# Patient Record
Sex: Male | Born: 1935
Health system: Southern US, Community
[De-identification: ages and names within clinical notes are randomized; demographics above are authoritative.]

## PROBLEM LIST (undated history)

## (undated) DIAGNOSIS — D649 Anemia, unspecified: Secondary | ICD-10-CM

## (undated) DIAGNOSIS — D638 Anemia in other chronic diseases classified elsewhere: Secondary | ICD-10-CM

## (undated) DIAGNOSIS — J309 Allergic rhinitis, unspecified: Secondary | ICD-10-CM

## (undated) DIAGNOSIS — E669 Obesity, unspecified: Secondary | ICD-10-CM

## (undated) DIAGNOSIS — I10 Essential (primary) hypertension: Secondary | ICD-10-CM

## (undated) DIAGNOSIS — S0591XA Unspecified injury of right eye and orbit, initial encounter: Secondary | ICD-10-CM

## (undated) DIAGNOSIS — N529 Male erectile dysfunction, unspecified: Secondary | ICD-10-CM

## (undated) DIAGNOSIS — IMO0002 Reserved for concepts with insufficient information to code with codable children: Secondary | ICD-10-CM

## (undated) DIAGNOSIS — E785 Hyperlipidemia, unspecified: Secondary | ICD-10-CM

## (undated) DIAGNOSIS — Z87438 Personal history of other diseases of male genital organs: Secondary | ICD-10-CM

## (undated) DIAGNOSIS — E119 Type 2 diabetes mellitus without complications: Secondary | ICD-10-CM

## (undated) DIAGNOSIS — N200 Calculus of kidney: Secondary | ICD-10-CM

## (undated) DIAGNOSIS — I639 Cerebral infarction, unspecified: Secondary | ICD-10-CM

## (undated) DIAGNOSIS — M5137 Other intervertebral disc degeneration, lumbosacral region: Secondary | ICD-10-CM

## (undated) DIAGNOSIS — K620 Anal polyp: Secondary | ICD-10-CM

## (undated) DIAGNOSIS — R9430 Abnormal result of cardiovascular function study, unspecified: Secondary | ICD-10-CM

## (undated) DIAGNOSIS — N400 Enlarged prostate without lower urinary tract symptoms: Secondary | ICD-10-CM

## (undated) DIAGNOSIS — K621 Rectal polyp: Secondary | ICD-10-CM

## (undated) HISTORY — DX: Anemia in other chronic diseases classified elsewhere: D63.8

## (undated) HISTORY — DX: Anemia, unspecified: D64.9

## (undated) HISTORY — DX: Abnormal result of cardiovascular function study, unspecified: R94.30

## (undated) HISTORY — DX: Calculus of kidney: N20.0

## (undated) HISTORY — PX: PROSTATE BIOPSY: SHX241

## (undated) HISTORY — DX: Other intervertebral disc degeneration, lumbosacral region: M51.37

## (undated) HISTORY — DX: Hyperlipidemia, unspecified: E78.5

## (undated) HISTORY — DX: Essential (primary) hypertension: I10

## (undated) HISTORY — DX: Reserved for concepts with insufficient information to code with codable children: IMO0002

## (undated) HISTORY — DX: Obesity, unspecified: E66.9

## (undated) HISTORY — DX: Rectal polyp: K62.1

## (undated) HISTORY — DX: Type 2 diabetes mellitus without complications: E11.9

## (undated) HISTORY — DX: Anal polyp: K62.0

## (undated) HISTORY — PX: RECTAL POLYPECTOMY: SHX2309

## (undated) HISTORY — DX: Benign prostatic hyperplasia without lower urinary tract symptoms: N40.0

## (undated) HISTORY — DX: Allergic rhinitis, unspecified: J30.9

## (undated) HISTORY — DX: Unspecified injury of right eye and orbit, initial encounter: S05.91XA

## (undated) HISTORY — DX: Personal history of other diseases of male genital organs: Z87.438

## (undated) HISTORY — PX: SKIN BIOPSY: SHX1

## (undated) HISTORY — DX: Cerebral infarction, unspecified: I63.9

## (undated) HISTORY — DX: Male erectile dysfunction, unspecified: N52.9

## (undated) HISTORY — PX: LUMBAR DISC SURGERY: SHX700

---

## 2001-09-19 ENCOUNTER — Encounter: Payer: Self-pay | Admitting: Internal Medicine

## 2001-12-08 ENCOUNTER — Encounter: Admission: RE | Admit: 2001-12-08 | Discharge: 2002-03-08 | Payer: Self-pay | Admitting: Internal Medicine

## 2003-01-01 ENCOUNTER — Encounter: Payer: Self-pay | Admitting: General Surgery

## 2003-01-01 ENCOUNTER — Encounter: Admission: RE | Admit: 2003-01-01 | Discharge: 2003-01-01 | Payer: Self-pay | Admitting: General Surgery

## 2003-01-04 ENCOUNTER — Encounter (INDEPENDENT_AMBULATORY_CARE_PROVIDER_SITE_OTHER): Payer: Self-pay | Admitting: *Deleted

## 2003-01-04 ENCOUNTER — Ambulatory Visit (HOSPITAL_BASED_OUTPATIENT_CLINIC_OR_DEPARTMENT_OTHER): Admission: RE | Admit: 2003-01-04 | Discharge: 2003-01-04 | Payer: Self-pay | Admitting: General Surgery

## 2004-10-03 ENCOUNTER — Ambulatory Visit: Payer: Self-pay | Admitting: Internal Medicine

## 2004-10-16 ENCOUNTER — Ambulatory Visit: Payer: Self-pay | Admitting: Internal Medicine

## 2005-09-06 ENCOUNTER — Ambulatory Visit: Payer: Self-pay | Admitting: Internal Medicine

## 2005-09-07 ENCOUNTER — Ambulatory Visit: Payer: Self-pay | Admitting: Internal Medicine

## 2005-10-16 ENCOUNTER — Ambulatory Visit (HOSPITAL_COMMUNITY): Admission: RE | Admit: 2005-10-16 | Discharge: 2005-10-16 | Payer: Self-pay | Admitting: Internal Medicine

## 2005-10-16 ENCOUNTER — Ambulatory Visit: Payer: Self-pay | Admitting: Internal Medicine

## 2005-10-16 ENCOUNTER — Encounter (INDEPENDENT_AMBULATORY_CARE_PROVIDER_SITE_OTHER): Payer: Self-pay | Admitting: Specialist

## 2005-11-01 ENCOUNTER — Ambulatory Visit: Admission: RE | Admit: 2005-11-01 | Discharge: 2005-11-01 | Payer: Self-pay | Admitting: General Surgery

## 2005-11-01 ENCOUNTER — Encounter (INDEPENDENT_AMBULATORY_CARE_PROVIDER_SITE_OTHER): Payer: Self-pay | Admitting: Specialist

## 2005-11-15 ENCOUNTER — Ambulatory Visit: Payer: Self-pay | Admitting: Internal Medicine

## 2006-09-19 ENCOUNTER — Ambulatory Visit: Payer: Self-pay | Admitting: Internal Medicine

## 2006-11-25 ENCOUNTER — Ambulatory Visit: Payer: Self-pay | Admitting: Internal Medicine

## 2006-12-25 ENCOUNTER — Ambulatory Visit: Payer: Self-pay | Admitting: Internal Medicine

## 2006-12-25 LAB — CONVERTED CEMR LAB
AST: 31 units/L (ref 0–37)
Basophils Relative: 0.4 % (ref 0.0–1.0)
Bilirubin, Direct: 0.1 mg/dL (ref 0.0–0.3)
Chloride: 106 meq/L (ref 96–112)
Creatinine, Ser: 1 mg/dL (ref 0.4–1.5)
Creatinine,U: 31.5 mg/dL
Eosinophils Absolute: 0.2 10*3/uL (ref 0.0–0.6)
GFR calc non Af Amer: 79 mL/min
Glucose, Bld: 134 mg/dL — ABNORMAL HIGH (ref 70–99)
Hemoglobin: 11.4 g/dL — ABNORMAL LOW (ref 13.0–17.0)
Lymphocytes Relative: 42.5 % (ref 12.0–46.0)
MCHC: 33.1 g/dL (ref 30.0–36.0)
MCV: 84.8 fL (ref 78.0–100.0)
Monocytes Absolute: 0.3 10*3/uL (ref 0.2–0.7)
Monocytes Relative: 8.6 % (ref 3.0–11.0)
Neutro Abs: 1.5 10*3/uL (ref 1.4–7.7)
Potassium: 4 meq/L (ref 3.5–5.1)
Sodium: 141 meq/L (ref 135–145)
TSH: 1.48 microintl units/mL (ref 0.35–5.50)
Total Bilirubin: 0.9 mg/dL (ref 0.3–1.2)
Vitamin B-12: 487 pg/mL (ref 211–911)

## 2007-03-31 DIAGNOSIS — E785 Hyperlipidemia, unspecified: Secondary | ICD-10-CM

## 2007-03-31 DIAGNOSIS — E663 Overweight: Secondary | ICD-10-CM | POA: Insufficient documentation

## 2007-03-31 DIAGNOSIS — K621 Rectal polyp: Secondary | ICD-10-CM

## 2007-03-31 DIAGNOSIS — K620 Anal polyp: Secondary | ICD-10-CM | POA: Insufficient documentation

## 2007-03-31 DIAGNOSIS — E119 Type 2 diabetes mellitus without complications: Secondary | ICD-10-CM

## 2007-03-31 DIAGNOSIS — M5137 Other intervertebral disc degeneration, lumbosacral region: Secondary | ICD-10-CM | POA: Insufficient documentation

## 2007-03-31 DIAGNOSIS — I10 Essential (primary) hypertension: Secondary | ICD-10-CM | POA: Insufficient documentation

## 2007-03-31 DIAGNOSIS — D638 Anemia in other chronic diseases classified elsewhere: Secondary | ICD-10-CM

## 2007-03-31 DIAGNOSIS — E782 Mixed hyperlipidemia: Secondary | ICD-10-CM | POA: Insufficient documentation

## 2007-03-31 DIAGNOSIS — N400 Enlarged prostate without lower urinary tract symptoms: Secondary | ICD-10-CM

## 2007-03-31 DIAGNOSIS — M51379 Other intervertebral disc degeneration, lumbosacral region without mention of lumbar back pain or lower extremity pain: Secondary | ICD-10-CM

## 2007-03-31 HISTORY — DX: Essential (primary) hypertension: I10

## 2007-03-31 HISTORY — DX: Other intervertebral disc degeneration, lumbosacral region: M51.37

## 2007-03-31 HISTORY — DX: Type 2 diabetes mellitus without complications: E11.9

## 2007-03-31 HISTORY — DX: Benign prostatic hyperplasia without lower urinary tract symptoms: N40.0

## 2007-03-31 HISTORY — DX: Other intervertebral disc degeneration, lumbosacral region without mention of lumbar back pain or lower extremity pain: M51.379

## 2007-03-31 HISTORY — DX: Anemia in other chronic diseases classified elsewhere: D63.8

## 2007-03-31 HISTORY — DX: Anal polyp: K62.0

## 2007-03-31 HISTORY — DX: Hyperlipidemia, unspecified: E78.5

## 2007-05-17 ENCOUNTER — Encounter: Payer: Self-pay | Admitting: *Deleted

## 2007-06-26 ENCOUNTER — Ambulatory Visit: Payer: Self-pay | Admitting: Internal Medicine

## 2007-06-26 LAB — CONVERTED CEMR LAB
BUN: 12 mg/dL (ref 6–23)
Basophils Relative: 0.1 % (ref 0.0–1.0)
CO2: 33 meq/L — ABNORMAL HIGH (ref 19–32)
Cholesterol: 163 mg/dL (ref 0–200)
Creatinine, Ser: 1.1 mg/dL (ref 0.4–1.5)
Eosinophils Relative: 4.7 % (ref 0.0–5.0)
GFR calc Af Amer: 85 mL/min
Glucose, Bld: 131 mg/dL — ABNORMAL HIGH (ref 70–99)
Lymphocytes Relative: 35.6 % (ref 12.0–46.0)
Monocytes Absolute: 0.3 10*3/uL (ref 0.2–0.7)
Neutro Abs: 1.8 10*3/uL (ref 1.4–7.7)
Neutrophils Relative %: 51.8 % (ref 43.0–77.0)
Potassium: 4.9 meq/L (ref 3.5–5.1)
RDW: 13.9 % (ref 11.5–14.6)
Total CHOL/HDL Ratio: 3.5
Triglycerides: 81 mg/dL (ref 0–149)

## 2007-07-24 ENCOUNTER — Encounter: Payer: Self-pay | Admitting: Internal Medicine

## 2007-10-13 ENCOUNTER — Ambulatory Visit: Payer: Self-pay | Admitting: Internal Medicine

## 2007-10-13 DIAGNOSIS — D649 Anemia, unspecified: Secondary | ICD-10-CM | POA: Insufficient documentation

## 2007-10-13 DIAGNOSIS — J309 Allergic rhinitis, unspecified: Secondary | ICD-10-CM

## 2007-10-13 HISTORY — DX: Allergic rhinitis, unspecified: J30.9

## 2008-03-01 ENCOUNTER — Telehealth: Payer: Self-pay | Admitting: Internal Medicine

## 2008-03-01 DIAGNOSIS — L989 Disorder of the skin and subcutaneous tissue, unspecified: Secondary | ICD-10-CM | POA: Insufficient documentation

## 2008-06-17 LAB — CONVERTED CEMR LAB: PSA: <3 ng/mL

## 2008-11-08 ENCOUNTER — Ambulatory Visit: Payer: Self-pay | Admitting: Internal Medicine

## 2008-11-16 ENCOUNTER — Ambulatory Visit (HOSPITAL_COMMUNITY): Admission: RE | Admit: 2008-11-16 | Discharge: 2008-11-16 | Payer: Self-pay | Admitting: Internal Medicine

## 2008-11-16 ENCOUNTER — Ambulatory Visit: Payer: Self-pay | Admitting: Internal Medicine

## 2008-11-16 LAB — HM COLONOSCOPY

## 2008-11-17 ENCOUNTER — Telehealth: Payer: Self-pay | Admitting: Internal Medicine

## 2008-12-22 ENCOUNTER — Ambulatory Visit: Payer: Self-pay | Admitting: Internal Medicine

## 2008-12-22 DIAGNOSIS — R5381 Other malaise: Secondary | ICD-10-CM | POA: Insufficient documentation

## 2008-12-22 DIAGNOSIS — R5383 Other fatigue: Secondary | ICD-10-CM

## 2008-12-23 ENCOUNTER — Telehealth: Payer: Self-pay | Admitting: Internal Medicine

## 2008-12-23 LAB — CONVERTED CEMR LAB
Albumin: 4.3 g/dL (ref 3.5–5.2)
Basophils Absolute: 0 10*3/uL (ref 0.0–0.1)
Basophils Relative: 1.2 % (ref 0.0–3.0)
CO2: 30 meq/L (ref 19–32)
Calcium: 9.9 mg/dL (ref 8.4–10.5)
Chloride: 109 meq/L (ref 96–112)
Creatinine, Ser: 1 mg/dL (ref 0.4–1.5)
Eosinophils Absolute: 0.1 10*3/uL (ref 0.0–0.7)
Folate: 14.5 ng/mL
Glucose, Bld: 149 mg/dL — ABNORMAL HIGH (ref 70–99)
HDL: 41.7 mg/dL (ref >39.00)
Hemoglobin: 12.2 g/dL — ABNORMAL LOW (ref 13.0–17.0)
LDL Cholesterol: 116 mg/dL — ABNORMAL HIGH (ref 0–99)
MCHC: 33 g/dL (ref 30.0–36.0)
MCV: 84.7 fL (ref 78.0–100.0)
Monocytes Absolute: 0.4 10*3/uL (ref 0.1–1.0)
Neutro Abs: 1.9 10*3/uL (ref 1.4–7.7)
RBC: 4.37 M/uL (ref 4.22–5.81)
RDW: 13.9 % (ref 11.5–14.6)
Sed Rate: 20 mm/hr (ref 0–22)
TSH: 0.99 microintl units/mL (ref 0.35–5.50)
Total CHOL/HDL Ratio: 4
Total Protein: 7.4 g/dL (ref 6.0–8.3)
Triglycerides: 145 mg/dL (ref 0.0–149.0)
VLDL: 29 mg/dL (ref 0.0–40.0)
Vitamin B-12: 397 pg/mL (ref 211–911)

## 2009-01-19 ENCOUNTER — Emergency Department (HOSPITAL_COMMUNITY): Admission: EM | Admit: 2009-01-19 | Discharge: 2009-01-19 | Payer: Self-pay | Admitting: Family Medicine

## 2009-01-20 ENCOUNTER — Telehealth (INDEPENDENT_AMBULATORY_CARE_PROVIDER_SITE_OTHER): Payer: Self-pay | Admitting: *Deleted

## 2009-01-21 ENCOUNTER — Encounter: Payer: Self-pay | Admitting: Internal Medicine

## 2009-08-17 ENCOUNTER — Telehealth (INDEPENDENT_AMBULATORY_CARE_PROVIDER_SITE_OTHER): Payer: Self-pay | Admitting: *Deleted

## 2009-08-18 ENCOUNTER — Telehealth: Payer: Self-pay | Admitting: Internal Medicine

## 2009-12-05 ENCOUNTER — Ambulatory Visit: Payer: Self-pay | Admitting: Internal Medicine

## 2009-12-05 LAB — CONVERTED CEMR LAB
Alkaline Phosphatase: 77 units/L (ref 39–117)
Basophils Relative: 0.3 % (ref 0.0–3.0)
Bilirubin, Direct: 0.2 mg/dL (ref 0.0–0.3)
CO2: 28 meq/L (ref 19–32)
Calcium: 9.4 mg/dL (ref 8.4–10.5)
Creatinine, Ser: 1 mg/dL (ref 0.4–1.5)
Eosinophils Absolute: 0.1 10*3/uL (ref 0.0–0.7)
GFR calc non Af Amer: 94.47 mL/min (ref >60)
HDL: 52.3 mg/dL (ref >39.00)
Hemoglobin, Urine: NEGATIVE
Ketones, ur: NEGATIVE mg/dL
Lymphocytes Relative: 39.7 % (ref 12.0–46.0)
MCHC: 31.8 g/dL (ref 30.0–36.0)
Monocytes Relative: 10.3 % (ref 3.0–12.0)
Neutrophils Relative %: 45.9 % (ref 43.0–77.0)
RBC: 4.44 M/uL (ref 4.22–5.81)
TSH: 1.32 microintl units/mL (ref 0.35–5.50)
Total CHOL/HDL Ratio: 4
Total Protein: 7.2 g/dL (ref 6.0–8.3)
Urine Glucose: >=1000 mg/dL
Urobilinogen, UA: 0.2 (ref 0.0–1.0)
VLDL: 27.8 mg/dL (ref 0.0–40.0)
WBC: 3.6 10*3/uL — ABNORMAL LOW (ref 4.5–10.5)

## 2010-05-29 ENCOUNTER — Ambulatory Visit: Payer: Self-pay | Admitting: Internal Medicine

## 2010-05-29 LAB — CONVERTED CEMR LAB
CO2: 31 meq/L (ref 19–32)
Chloride: 104 meq/L (ref 96–112)
Glucose, Bld: 145 mg/dL — ABNORMAL HIGH (ref 70–99)
HDL: 42.1 mg/dL (ref >39.00)
LDL Cholesterol: 97 mg/dL (ref 0–99)
Potassium: 4.8 meq/L (ref 3.5–5.1)
Sodium: 141 meq/L (ref 135–145)
Total CHOL/HDL Ratio: 4
Triglycerides: 162 mg/dL — ABNORMAL HIGH (ref 0.0–149.0)

## 2010-08-02 ENCOUNTER — Emergency Department (HOSPITAL_COMMUNITY): Admission: EM | Admit: 2010-08-02 | Discharge: 2010-08-02 | Payer: Self-pay | Admitting: Family Medicine

## 2010-10-05 ENCOUNTER — Encounter: Payer: Self-pay | Admitting: Internal Medicine

## 2010-10-17 NOTE — Assessment & Plan Note (Signed)
Summary: 6 MOS F/U / # / CD   Vital Signs:  Patient profile:   75 year old male Height:      72 inches Weight:      229.38 pounds BMI:     31.22 O2 Sat:      96 % on Room air Temp:     98.6 degrees F oral Pulse rate:   86 / minute BP sitting:   162 / 78  (left arm) Cuff size:   large  Vitals Entered By: Zella Ball Ewing CMA (AAMA) (May 29, 2010 9:15 AM)  O2 Flow:  Room air CC: 6 month ROV/RE   CC:  6 month ROV/RE.  History of Present Illness: here to f/u - has lost close to 20 lbs by his scale at home;  wt here down 14 lbs,  goal wt he thinks owuld be 215;  due for flu shot today; real;ly trying to excercise and get wt down;  BP at home 140 sbp, Pt denies CP, worsening sob, doe, wheezing, orthopnea, pnd, worsening LE edema, palps, dizziness or syncope  Pt denies new neuro symptoms such as headache, facial or extremity weakness  .Pt denies polydipsia, polyuria, or low sugar symptoms such as shakiness improved with eating.  Overall good compliance with meds, trying to follow low chol, DM diet, wt stable, little excercise however  No fever, wt loss, night sweats, loss of appetite or other constitutional symptoms  Unfort could not tolerated hte metformin due to GI upset and felt terrible.    Preventive Screening-Counseling & Management      Drug Use:  no.    Problems Prior to Update: 1)  Preventive Health Care  (ICD-V70.0) 2)  Fatigue  (ICD-780.79) 3)  Skin Lesion  (ICD-709.9) 4)  Allergic Rhinitis  (ICD-477.9) 5)  Anemia-nos  (ICD-285.9) 6)  Disc Disease, Lumbar  (ICD-722.52) 7)  Polyp, Anal and Rectal  (ICD-569.0) 8)  Anemia, Chronic Disease Nec  (ICD-285.29) 9)  Obesity Nos  (ICD-278.00) 10)  Benign Prostatic Hypertrophy  (ICD-600.00) 11)  Hypertension  (ICD-401.9) 12)  Hyperlipidemia  (ICD-272.4) 13)  Diabetes Mellitus, Type II  (ICD-250.00)  Medications Prior to Update: 1)  Benicar 20 Mg Tabs (Olmesartan Medoxomil) .... Take 1 Tablet By Mouth Once A Day 2)  Cialis 20  Mg Tabs (Tadalafil) .[name].. 1 By Mouth Every Other Day As Needed 3)  Tamsulosin Hcl 0.4 Mg Caps (Tamsulosin Hcl) .[name].. 1po Once Daily 4)  Lipitor 20 Mg Tabs (Atorvastatin Calcium) .[name].. 1 By Mouth Once Daily 5)  Glimepiride 4 Mg  Tabs (Glimepiride) .[name].. 1 By Mouth Two Times A Day 6)  Finasteride 5 Mg  Tabs (Finasteride) .[name].. 1 By Mouth Once Daily 7)  Ecotrin Low Strength 81 Mg  Tbec (Aspirin) .[name].. 1po Qd 8)  Fluticasone Propionate 50 Mcg/act Susp (Fluticasone Propionate) .... 2 Spray/side Once Daily 9)  Metformin Hcl 500 Mg Xr24h-Tab (Metformin Hcl) .... 2po Once Daily  Current Medications (verified): 1)  Benicar 20 Mg Tabs (Olmesartan Medoxomil) .... Take 1 Tablet By Mouth Once A Day 2)  Cialis 20 Mg Tabs (Tadalafil) .[name].. 1 By Mouth Every Other Day As Needed 3)  Tamsulosin Hcl 0.4 Mg Caps (Tamsulosin Hcl) .[name].. 1po Once Daily 4)  Lipitor 20 Mg Tabs (Atorvastatin Calcium) .[name].. 1 By Mouth Once Daily 5)  Glimepiride 4 Mg  Tabs (Glimepiride) .[name].. 1 By Mouth Two Times A Day 6)  Finasteride 5 Mg  Tabs (Finasteride) .[name].. 1 By Mouth Once Daily 7)  Ecotrin Low  Strength 81 Mg  Tbec (Aspirin) .[name].. 1po Qd 8)  Fluticasone Propionate 50 Mcg/act Susp (Fluticasone Propionate) .... 2 Spray/side Once Daily 9)  Metformin Hcl 500 Mg Xr24h-Tab (Metformin Hcl) .... 2po Once Daily  Allergies (verified): 1)  * Metformin  Past History:  Past Medical History: Last updated: 10/13/2007 Diabetes mellitus, type II Hyperlipidemia Hypertension Benign prostatic hypertrophy Obesity H/O Chronic "Borderline" Anemia Anemia-NOS hx of prostatitis E.D. 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   Head:  normocephalic and atraumatic.   Eyes:  vision grossly intact, pupils equal, and pupils round.   Ears:  R ear normal and L ear normal.   Nose:  no external deformity and no nasal discharge.   Mouth:  no gingival abnormalities and pharynx pink and moist.   Neck:  supple and no masses.   Lungs:  normal respiratory effort and normal breath sounds.   Heart:  normal rate and regular rhythm.   Abdomen:  soft, non-tender, and normal bowel sounds.   Extremities:  no edema, no erythema    Impression & Recommendations:  Problem # 1:  HYPERTENSION (ICD-401.9)  His updated medication list for this problem includes:    Benicar 40 Mg Tabs (Olmesartan medoxomil) .[name]... 1po once daily uncontrolled - to incr the benicar to 40 mg - f/u BP at home and next visit  BP today: 162/78 Prior BP: 164/80 (12/05/2009)  Labs Reviewed: K+: 4.5 (12/05/2009) Creat: : 1.0 (12/05/2009)   Chol: 187 (12/05/2009)   HDL: 52.30 (12/05/2009)   LDL: 107 (12/05/2009)   TG: 139.0 (12/05/2009)  Problem # 2:  DIABETES MELLITUS, TYPE II (ICD-250.00)  The following medications were removed from the medication list:    Metformin Hcl 500 Mg Xr24h-tab (Metformin hcl) .[name]... 2po once daily His updated medication list for this problem includes:    Benicar 40 Mg Tabs (Olmesartan medoxomil) .[name]... 1po once daily    Glimepiride 4 Mg Tabs (Glimepiride) .[name]... 1 by mouth two times a day    Ecotrin Low Strength 81 Mg Tbec (Aspirin) .[name]... 1po qd  Labs Reviewed: Creat: 1.0 (12/05/2009)    Reviewed HgBA1c results: 8.4 (12/05/2009)  6.1 (12/22/2008) to hold on the  metformoin, work more on diet, wt loss, adn cont all other meds  Orders: TLB-BMP (Basic Metabolic Panel-BMET) (80048-METABOL) TLB-A1C / Hgb A1C (Glycohemoglobin) (83036-A1C) TLB-Lipid Panel (80061-LIPID)  Problem # 3:  HYPERLIPIDEMIA (ICD-272.4)  His updated medication list for this problem includes:    Lipitor 20 Mg Tabs (Atorvastatin calcium) .[name]... 1 by mouth once daily  Labs Reviewed: SGOT: 25 (12/05/2009)   SGPT: 27 (12/05/2009)   HDL:52.30 (12/05/2009), 41.70 (12/22/2008)  LDL:107 (12/05/2009), 116 (12/22/2008)  Chol:187 (12/05/2009), 187 (12/22/2008)  Trig:139.0 (12/05/2009), 145.0 (12/22/2008) stable overall by hx and exam, ok to continue meds/tx as is  Pt to continue  diet efforts, good med tolerance; to check labs - goal LDL less than 70   Complete Medication List: 1)  Benicar 40 Mg Tabs (Olmesartan medoxomil) .[name].. 1po once daily 2)  Cialis 20 Mg Tabs (Tadalafil) .[name].. 1 by mouth every other day as needed 3)  Tamsulosin Hcl 0.4 Mg Caps (Tamsulosin hcl) .[name].. 1po once daily 4)  Lipitor 20 Mg Tabs (Atorvastatin calcium) .[name].. 1 by mouth once daily 5)  Glimepiride 4 Mg Tabs (Glimepiride) .[name].. 1 by mouth two times a day 6)  Finasteride 5 Mg Tabs (Finasteride) .[name].. 1 by mouth once daily 7)  Ecotrin Low Strength 81 Mg Tbec (Aspirin) .[name].. 1po qd 8)  Fluticasone Propionate 50 Mcg/act Susp (Fluticasone propionate) .... 2 spray/side once daily  Other Orders: Flu Vaccine 37yrs + MEDICARE PATIENTS (X5400) Administration Flu vaccine - MCR (Q6761)  Patient Instructions: 1)  you had the flu shot today 2)  stop the metformin 500 as you have 3)  increase the benicar to 40 mg per day 4)  Continue all previous medications as before this visit  5)  Please schedule a follow-up appointment in 6 months for your 6)   "yearly medicare exam" 7)  Please go to the Lab in the basement for your blood and/or urine tests today  8)  Please call the number on the [facility] Card for results of your testing    Prescriptions: BENICAR 40 MG TABS (OLMESARTAN MEDOXOMIL) 1po once daily  #90 x 3   Entered and Authorized by:   Corwin Levins MD   Signed by:   Corwin Levins MD on 05/29/2010   Method used:   Print then Give to Patient   RxID:   [id number]     Flu Vaccine Consent Questions     Do you have a history of severe allergic reactions to this vaccine? no    Any prior history of allergic reactions to egg and/or gelatin? no    Do you have a sensitivity to the preservative Thimersol? no    Do you have a past history of Guillan-Barre Syndrome? no    Do you currently have an acute febrile illness? no    Have you ever had a severe reaction to latex? no    Vaccine information given and explained to patient? yes    Are you currently pregnant? no    Lot Number:[id number]   Exp Date:03/17/2011   Site Given  Left Deltoid IMlu

## 2010-10-17 NOTE — Assessment & Plan Note (Signed)
Summary: med refill per pt/#/cd   Vital Signs:  Patient profile:   75 year old male Height:      72 inches Weight:      243 pounds BMI:     33.08 O2 Sat:      98 % on Room air Temp:     97.3 degrees F oral Pulse rate:   58 / minute BP sitting:   164 / 80  (left arm) Cuff size:   large  Vitals Entered ByZella Ball Ewing (December 05, 2009 9:08 AM)  O2 Flow:  Room air  Preventive Care Screening  Last Flu Shot:    Date:  06/17/2009    Results:  given   CC: Medication refills/RE   CC:  Medication refills/RE.  History of Present Illness: here for refills, last meds taken today;  overall good compliance and tolerabile;  Pt denies CP, sob, doe, wheezing, orthopnea, pnd, worsening LE edema, palps, dizziness or syncope   Pt denies new neuro symptoms such as headache, facial or extremity weakness   Pt denies polydipsia, polyuria, or low sugar symptoms such as shakiness improved with eating.  Overall good compliance with meds, trying to follow low chol, DM diet, wt stable, little excercise however   BP frequently at home < 140/90,  now with onset mild nasal allergy congestion as well   Preventive Screening-Counseling & Management      Drug Use:  no.    Problems Prior to Update: 1)  Fatigue  (ICD-780.79) 2)  Skin Lesion  (ICD-709.9) 3)  Allergic Rhinitis  (ICD-477.9) 4)  Anemia-nos  (ICD-285.9) 5)  Disc Disease, Lumbar  (ICD-722.52) 6)  Polyp, Anal and Rectal  (ICD-569.0) 7)  Anemia, Chronic Disease Nec  (ICD-285.29) 8)  Obesity Nos  (ICD-278.00) 9)  Benign Prostatic Hypertrophy  (ICD-600.00) 10)  Hypertension  (ICD-401.9) 11)  Hyperlipidemia  (ICD-272.4) 12)  Diabetes Mellitus, Type II  (ICD-250.00)  Medications Prior to Update: 1)  Benicar 20 Mg Tabs (Olmesartan Medoxomil) .... Take 1 Tablet By Mouth Once A Day 2)  Cialis 20 Mg Tabs (Tadalafil) 3)  Flomax 0.4 Mg Cp24 (Tamsulosin Hcl) .... Take 1 Capsule By Mouth Once A Day 4)  Lipitor 20 Mg Tabs (Atorvastatin Calcium) .[name].. 1 By  Mouth Once Daily 5)  Glimepiride 4 Mg  Tabs (Glimepiride) .[name].. 1 By Mouth Two Times A Day 6)  Finasteride 5 Mg  Tabs (Finasteride) .[name].. 1 By Mouth Once Daily 7)  Ecotrin Low Strength 81 Mg  Tbec (Aspirin) .[name].. 1po Qd  Current Medications (verified): 1)  Benicar 20 Mg Tabs (Olmesartan Medoxomil) .... Take 1 Tablet By Mouth Once A Day 2)  Cialis 20 Mg Tabs (Tadalafil) .[name].. 1 By Mouth Every Other Day As Needed 3)  Tamsulosin Hcl 0.4 Mg Caps (Tamsulosin Hcl) .[name].. 1po Once Daily 4)  Lipitor 20 Mg Tabs (Atorvastatin Calcium) .[name].. 1 By Mouth Once Daily 5)  Glimepiride 4 Mg  Tabs (Glimepiride) .[name].. 1 By Mouth Two Times A Day 6)  Finasteride 5 Mg  Tabs (Finasteride) .[name].. 1 By Mouth Once Daily 7)  Ecotrin Low Strength 81 Mg  Tbec (Aspirin) .[name].. 1po Qd  Allergies (verified): No Known Drug Allergies  Past History:  Past Medical History: Last updated: 10/13/2007 Diabetes mellitus, type II Hyperlipidemia Hypertension Benign prostatic hypertrophy Obesity H/O Chronic "Borderline" Anemia Anemia-NOS hx of prostatitis E.D. hx of right eye trauma as child Allergic rhinitis  Past Surgical History: Last updated: 12/22/2008 Transanal Excision of Rectal Polyp  - 10/2005  Lumbar Disc Surgery - 1999 s/p prostate bx s/p skin biopsy right upper back 2009 - benign - dr Danella Deis  Family History: Last updated: 10/13/2007 mother with colon cancer DM prostate cancer HTN dementia  Social History: Last updated: 12/05/2009 Never Smoked Alcohol use-yes Married Drug use-no  Risk Factors: Smoking Status: never (10/13/2007)  Social History: Reviewed history from 10/13/2007 and no changes required. Never Smoked Alcohol use-yes Married Drug use-no Drug Use:  no  Review of Systems  The patient denies anorexia, fever, weight loss, weight gain, vision loss, decreased hearing, hoarseness, chest pain, syncope, dyspnea on exertion, peripheral edema, prolonged cough, headaches, hemoptysis, abdominal  pain, melena, hematochezia, severe indigestion/heartburn, hematuria, incontinence, muscle weakness, suspicious skin lesions, transient blindness, difficulty walking, depression, unusual weight change, abnormal bleeding, enlarged lymph nodes, and angioedema.         all otherwise negative per pt -    Physical Exam  General:  alert and overweight-appearing.   Head:  normocephalic and atraumatic.   Eyes:  vision grossly intact, pupils equal, and pupils round.   Ears:  R ear normal and L ear normal.   Nose:  no external deformity and no nasal discharge.   Mouth:  no gingival abnormalities and pharynx pink and moist.   Neck:  supple and no masses.   Lungs:  normal respiratory effort and normal breath sounds.   Heart:  normal rate and regular rhythm.   Abdomen:  soft, non-tender, and normal bowel sounds.   Msk:  no joint tenderness and no joint swelling.   Extremities:  no edema, no erythema  Neurologic:  cranial nerves II-XII intact and strength normal in all extremities.     Impression & Recommendations:  Problem # 1:  Preventive Health Care (ICD-V70.0)  Overall doing well, age appropriate education and counseling updated and referral for appropriate preventive services done unless declined, immunizations up to date or declined, diet counseling done if overweight, urged to quit smoking if smokes , most recent labs reviewed and current ordered if appropriate, ecg reviewed or declined (interpretation per ECG scanned in the EMR if done); information regarding Medicare Prevention requirements given if appropriate   Orders: TLB-BMP (Basic Metabolic Panel-BMET) (80048-METABOL) TLB-CBC Platelet - w/Differential (85025-CBCD) TLB-Hepatic/Liver Function Pnl (80076-HEPATIC) TLB-Lipid Panel (80061-LIPID) TLB-TSH (Thyroid Stimulating Hormone) (84443-TSH) TLB-PSA (Prostate Specific Antigen) (84153-PSA) TLB-Udip ONLY (81003-UDIP)  Problem # 2:  DIABETES MELLITUS, TYPE II (ICD-250.00)  His updated  medication list for this problem includes:    Benicar 20 Mg Tabs (Olmesartan medoxomil) .[name]... Take 1 tablet by mouth once a day    Glimepiride 4 Mg Tabs (Glimepiride) .[name]... 1 by mouth two times a day    Ecotrin Low Strength 81 Mg Tbec (Aspirin) .[name]... 1po qd  Orders: TLB-A1C / Hgb A1C (Glycohemoglobin) (83036-A1C) TLB-Microalbumin/Creat Ratio, Urine (82043-MALB)  Labs Reviewed: Creat: 1.0 (12/22/2008)    Reviewed HgBA1c results: 6.1 (12/22/2008)  5.6 (06/26/2007) stable overall by hx and exam, ok to continue meds/tx as is   Problem # 3:  HYPERTENSION (ICD-401.9)  His updated medication list for this problem includes:    Benicar 20 Mg Tabs (Olmesartan medoxomil) .[name]... Take 1 tablet by mouth once a day  BP today: 164/80 Prior BP: 130/84 (12/22/2008)  Labs Reviewed: K+: 4.7 (12/22/2008) Creat: : 1.0 (12/22/2008)   Chol: 187 (12/22/2008)   HDL: 41.70 (12/22/2008)   LDL: 116 (12/22/2008)   TG: 145.0 (12/22/2008) stable overall by hx and exam, ok to continue meds/tx as is   Problem # 4:  ALLERGIC RHINITIS (ICD-477.9)  His updated medication list for this problem includes:    Fluticasone Propionate 50 Mcg/act Susp (Fluticasone propionate) .[name]... 2 spray/side once daily treat as above, f/u any worsening signs or symptoms   Complete Medication List: 1)  Benicar 20 Mg Tabs (Olmesartan medoxomil) .... Take 1 tablet by mouth once a day 2)  Cialis 20 Mg Tabs (Tadalafil) .[name].. 1 by mouth every other day as needed 3)  Tamsulosin Hcl 0.4 Mg Caps (Tamsulosin hcl) .[name].. 1po once daily 4)  Lipitor 20 Mg Tabs (Atorvastatin calcium) .[name].. 1 by mouth once daily 5)  Glimepiride 4 Mg Tabs (Glimepiride) .[name].. 1 by mouth two times a day 6)  Finasteride 5 Mg Tabs (Finasteride) .[name].. 1 by mouth once daily 7)  Ecotrin Low Strength 81 Mg Tbec (Aspirin) .[name].. 1po qd 8)  Fluticasone Propionate 50 Mcg/act Susp (Fluticasone propionate) .... 2 spray/side once daily  Patient Instructions: 1)  please call the  insurance to see if they cover the shingles shot 2)  If they do, please call for Nurse Visit to have the shot given 3)  Continue all previous medications as before this visit  4)  Please go to the Lab in the basement for your blood and/or urine tests today  5)  Please schedule a follow-up appointment in 6 months with: 6)  BMP prior to visit, ICD-9: 250.02 7)  Lipid Panel prior to visit, ICD-9: 8)  HbgA1C prior to visit, ICD-9: Prescriptions: FLUTICASONE PROPIONATE 50 MCG/ACT SUSP (FLUTICASONE PROPIONATE) 2 spray/side once daily  #3 x 3   Entered and Authorized by:   Corwin Levins MD   Signed by:   Corwin Levins MD on 12/05/2009   Method used:   Print then Give to Patient   RxID:   [id number] BENICAR 20 MG TABS (OLMESARTAN MEDOXOMIL) Take 1 tablet by mouth once a day  #90 x 3   Entered and Authorized by:   Corwin Levins MD   Signed by:   Corwin Levins MD on 12/05/2009   Method used:   Print then Give to Patient   RxID:   [id number] CIALIS 20 MG TABS (TADALAFIL) 1 by mouth every other day as needed  #5 x 11   Entered and Authorized by:   Corwin Levins MD   Signed by:   Corwin Levins MD on 12/05/2009   Method used:   Print then Give to Patient   RxID:   [id number] TAMSULOSIN HCL 0.4 MG CAPS (TAMSULOSIN HCL) 1po once daily  #90 x 3   Entered and Authorized by:   Corwin Levins MD   Signed by:   Corwin Levins MD on 12/05/2009   Method used:   Print then Give to Patient   RxID:   [id number] LIPITOR 20 MG TABS (ATORVASTATIN CALCIUM) 1 by mouth once daily  #90 x 3   Entered and Authorized by:   Corwin Levins MD   Signed by:   Corwin Levins MD on 12/05/2009   Method used:   Print then Give to Patient   RxID:   [id number] GLIMEPIRIDE 4 MG  TABS (GLIMEPIRIDE) 1 by mouth two times a day  #180 x 3   Entered and Authorized by:   Corwin Levins MD   Signed by:   Corwin Levins MD on 12/05/2009   Method used:   Print then Give to Patient   RxID:   [id number] FINASTERIDE 5  MG  TABS (  FINASTERIDE) 1 by mouth once daily  #90 x 3   Entered and Authorized by:   Corwin Levins MD   Signed by:   Corwin Levins MD on 12/05/2009   Method used:   Print then Give to Patient   RxID:   [id number]

## 2010-10-19 NOTE — Letter (Signed)
Summary: [facility]  [facility]   Imported By: Lester [place] 10/12/2010 10:14:54  _____________________________________________________________________  External Attachment:    Type:   Image     Comment:   External Document

## 2010-11-07 ENCOUNTER — Encounter: Payer: Self-pay | Admitting: Internal Medicine

## 2010-11-14 NOTE — Letter (Signed)
Summary: Barron Alvine MD  Barron Alvine MD   Imported By: Lester [place] 11/10/2010 09:44:18  _____________________________________________________________________  External Attachment:    Type:   Image     Comment:   External Document

## 2010-12-07 ENCOUNTER — Ambulatory Visit (INDEPENDENT_AMBULATORY_CARE_PROVIDER_SITE_OTHER): Payer: Medicare Other | Admitting: Internal Medicine

## 2010-12-07 ENCOUNTER — Encounter: Payer: Self-pay | Admitting: Internal Medicine

## 2010-12-07 ENCOUNTER — Other Ambulatory Visit (INDEPENDENT_AMBULATORY_CARE_PROVIDER_SITE_OTHER): Payer: Medicare Other

## 2010-12-07 VITALS — BP 118/72 | HR 56 | Temp 98.3°F | Ht 74.0 in | Wt 227.2 lb

## 2010-12-07 DIAGNOSIS — E785 Hyperlipidemia, unspecified: Secondary | ICD-10-CM

## 2010-12-07 DIAGNOSIS — R5381 Other malaise: Secondary | ICD-10-CM

## 2010-12-07 DIAGNOSIS — E119 Type 2 diabetes mellitus without complications: Secondary | ICD-10-CM

## 2010-12-07 DIAGNOSIS — I169 Hypertensive crisis, unspecified: Secondary | ICD-10-CM | POA: Insufficient documentation

## 2010-12-07 DIAGNOSIS — I10 Essential (primary) hypertension: Secondary | ICD-10-CM

## 2010-12-07 DIAGNOSIS — R5383 Other fatigue: Secondary | ICD-10-CM

## 2010-12-07 DIAGNOSIS — N200 Calculus of kidney: Secondary | ICD-10-CM

## 2010-12-07 HISTORY — DX: Calculus of kidney: N20.0

## 2010-12-07 LAB — BASIC METABOLIC PANEL
CO2: 29 mEq/L (ref 19–32)
Chloride: 103 mEq/L (ref 96–112)
Creatinine, Ser: 1 mg/dL (ref 0.4–1.5)

## 2010-12-07 LAB — HEPATIC FUNCTION PANEL
ALT: 29 U/L (ref 0–53)
Albumin: 4.3 g/dL (ref 3.5–5.2)
Bilirubin, Direct: 0.1 mg/dL (ref 0.0–0.3)
Total Bilirubin: 0.7 mg/dL (ref 0.3–1.2)
Total Protein: 7.1 g/dL (ref 6.0–8.3)

## 2010-12-07 LAB — CBC WITH DIFFERENTIAL/PLATELET
Eosinophils Absolute: 0.1 10*3/uL (ref 0.0–0.7)
Eosinophils Relative: 4 % (ref 0.0–5.0)
HCT: 36.7 % — ABNORMAL LOW (ref 39.0–52.0)
Lymphs Abs: 1.4 10*3/uL (ref 0.7–4.0)
MCHC: 32.9 g/dL (ref 30.0–36.0)
MCV: 83.9 fl (ref 78.0–100.0)
Monocytes Absolute: 0.4 10*3/uL (ref 0.1–1.0)
Neutrophils Relative %: 44.3 % (ref 43.0–77.0)
Platelets: 233 10*3/uL (ref 150.0–400.0)
RDW: 14.6 % (ref 11.5–14.6)

## 2010-12-07 LAB — MICROALBUMIN / CREATININE URINE RATIO: Microalb Creat Ratio: 0.2 mg/g (ref 0.0–30.0)

## 2010-12-07 NOTE — Assessment & Plan Note (Signed)
Mild, Exam otherwise benign, labs reviewed as documented, follow with expectant management

## 2010-12-07 NOTE — Assessment & Plan Note (Signed)
stable overall by hx and exam, ok to continue meds/tx as is   Lab Results  Component Value Date   CHOL 171 05/29/2010   CHOL 187 12/05/2009   CHOL 187 12/22/2008   Lab Results  Component Value Date   HDL 42.10 05/29/2010   HDL 78.46 12/05/2009   HDL 96.29 12/22/2008   Lab Results  Component Value Date   LDLCALC 97 05/29/2010   LDLCALC 107* 12/05/2009   LDLCALC 116* 12/22/2008   Lab Results  Component Value Date   TRIG 162.0* 05/29/2010   TRIG 139.0 12/05/2009   TRIG 145.0 12/22/2008   Lab Results  Component Value Date   CHOLHDL 4 05/29/2010   CHOLHDL 4 12/05/2009   CHOLHDL 4 12/22/2008

## 2010-12-07 NOTE — Assessment & Plan Note (Signed)
stable overall by hx and exam, ok to continue meds/tx as is   Lab Results  Component Value Date   WBC 3.5* 12/07/2010   HGB 12.1* 12/07/2010   HCT 36.7* 12/07/2010   PLT 233.0 12/07/2010   CHOL 171 05/29/2010   TRIG 162.0* 05/29/2010   HDL 42.10 05/29/2010   ALT 29 12/07/2010   AST 31 12/07/2010   NA 137 12/07/2010   K 4.9 12/07/2010   CL 103 12/07/2010   CREATININE 1.0 12/07/2010   BUN 17 12/07/2010   CO2 29 12/07/2010   TSH 0.52 12/07/2010   PSA 2.10 12/05/2009   HGBA1C 6.2 12/07/2010   MICROALBUR 0.7 12/07/2010

## 2010-12-07 NOTE — Progress Notes (Signed)
Subjective:    Patient ID: Brian [name], male    DOB: 02-25-1936, 75 y.o.   MRN: [id number]  HPI  Here for for f/u  -  Concerned about taking the actos any longer due to risk of bladder cancer, but does not realize he is not taking the actos (though the glimeparide was generic for this).  Pt denies chest pain, increased sob or doe, wheezing, orthopnea, PND, increased LE swelling, palpitations, dizziness or syncope.   Pt denies new neurological symptoms such as new headache, or facial or extremity weakness or numbness.   Pt denies polydipsia, polyuria, or low sugar symptoms such as weakness or confusion improved with po intake.  Pt states overall good compliance with meds, trying to follow lower cholesterol, diabetic diet, wt overall stable but little exercise however.     Pt denies fever, wt loss, night sweats, loss of appetite, or other constitutional symptoms  Denies worsening depressive symptoms, suicidal ideation, or panic.  Overall good compliance with treatment, and good medicine tolerability.      Review of Systems Review of Systems  Constitutional: Negative for diaphoresis and unexpected weight change.  HENT: Negative for drooling and tinnitus.   Eyes: Negative for photophobia and visual disturbance.  Respiratory: Negative for choking and stridor.   Gastrointestinal: Negative for vomiting and blood in stool.  Genitourinary: Negative for hematuria and decreased urine volume.  Musculoskeletal: Negative for gait problem.  Skin: Negative for color change and wound.  Neurological: Negative for tremors and numbness.  Psychiatric/Behavioral: Negative for decreased concentration. The patient is not hyperactive.   Past Medical History  Diagnosis Date  . Diabetes mellitus type II   . Hyperlipidemia   . Hypertension   . BPH (benign prostatic hypertrophy)   . Obesity   . Chronic anemia   . History of prostatitis   . ED (erectile dysfunction)   . Right eye trauma     hx as child  .  Allergic rhinitis   . Nephrolithiasis 12/07/2010  . ALLERGIC RHINITIS 10/13/2007  . ANEMIA, CHRONIC DISEASE NEC 03/31/2007  . BENIGN PROSTATIC HYPERTROPHY 03/31/2007  . DIABETES MELLITUS, TYPE II 03/31/2007  . DISC DISEASE, LUMBAR 03/31/2007  . HYPERLIPIDEMIA 03/31/2007  . HYPERTENSION 03/31/2007  . POLYP, ANAL AND RECTAL 03/31/2007   Past Surgical History  Procedure Date  . Rectal polypectomy     Transanal excision 10/2005  . Lumbar disc surgery     1999  . Prostate biopsy     s/p  . Skin biopsy     s/p right upper back 2009- benign Dr. Danella Deis    reports that he has never smoked. He does not have any smokeless tobacco history on file. He reports that he drinks alcohol. He reports that he does not use illicit drugs. family history includes Colon cancer in his mother. Allergies  Allergen Reactions  . Metformin     REACTION: GI upset       Objective:   Physical Exam    Physical Exam  Constitutional: Pt appears well-developed and well-nourished.  HENT: Head: Normocephalic.  Right Ear: External ear normal.  Left Ear: External ear normal.  Eyes: Conjunctivae and EOM are normal. Pupils are equal, round, and reactive to light.  Neck: Normal range of motion. Neck supple.  Cardiovascular: Normal rate and regular rhythm.   Pulmonary/Chest: Effort normal and breath sounds normal.  Abd:  Soft, NT, non-distended, + BS Neurological: Pt is alert. No cranial nerve deficit.  Skin: Skin is warm. No erythema.  Psychiatric: Pt behavior is normal. Thought content normal.      Assessment & Plan:

## 2010-12-07 NOTE — Patient Instructions (Addendum)
Please take Aspirin 81 mg - 1 per day - COATED only to reduce risk of heart disease and stroke Continue all other medications as before  Please go to LAB in the Basement for the blood and/or urine tests to be done today Please call the number on the [facility] Card for results in 2-3 days

## 2010-12-07 NOTE — Assessment & Plan Note (Signed)
stable overall by hx and exam, ok to continue meds/tx as is   Lab Results  Component Value Date   HGBA1C 6.2 12/07/2010

## 2010-12-11 ENCOUNTER — Other Ambulatory Visit: Payer: Self-pay

## 2010-12-11 MED ORDER — TAMSULOSIN HCL 0.4 MG PO CAPS: 0.4000 mg | ORAL_CAPSULE | Freq: Every day | ORAL | Status: DC

## 2010-12-11 NOTE — Telephone Encounter (Signed)
Refill request from [facility] for Tamsulosin 0.4 mg. Last filled 11/18/2009 and Last office visit 05/29/10.

## 2010-12-28 LAB — GLUCOSE, CAPILLARY: Glucose-Capillary: 162 mg/dL — ABNORMAL HIGH (ref 70–99)

## 2011-02-02 NOTE — Op Note (Signed)
   NAME:  Brian [name], Brian [name]                           ACCOUNT NO.:  [id number]   MEDICAL RECORD NO.:  [id number]                   PATIENT TYPE:  AMB   LOCATION:  DSC                                  FACILITY:  [facility]   PHYSICIAN:  Jimmye Norman III, M.D.               DATE OF BIRTH:  09/23/1935   DATE OF PROCEDURE:  01/04/2003  DATE OF DISCHARGE:                                 OPERATIVE REPORT   PREOPERATIVE DIAGNOSIS:  Right posterior neck mass.   POSTOPERATIVE DIAGNOSIS:  Lipoma of right posterior neck.   PROCEDURE:  Excision of right posterior neck lipoma.   SURGEON:  Jimmye Norman, M.D.   ANESTHESIA:  General endotracheal.   ESTIMATED BLOOD LOSS:  Less than 20 mL.   COMPLICATIONS:  None.   CONDITION:  Stable.   INDICATION FOR PROCEDURE:  The patient had a non-enlarging right posterior  neck mass, which he comes in for elective excision because of concern of  possible cancer.   FINDINGS:  The patient had a small 1.5 cm lipoma of the right posterior  neck, which was deep, above 1.5 cm deep into the neck.   DESCRIPTION OF PROCEDURE:  The patient was taken to the operating room and  placed on the table in the supine position.  After an adequate endotracheal  anesthetic was administered, he was flipped into the jackknife prone  position and prepped in the usual sterile manner.   A 3 cm transverse incision was made directly on top of the palpable mass in  the patient's right posterior neck.  It was taken down into the subcu using  electrocautery and also tenotomy scissors.  Once we got underneath the very  thick, about 1 cm thick, dermal tissue, the palpable mass was excised using  tenotomy scissors.  It was excised down to the muscle fascia but not into  the muscle itself.  No other palpable mass was noted upon excision of this  lipoma.  Once it was removed we closed with 4-0 Monocryl with one deep  suture and then the skin closed using a subcuticular stitch.  Side-to-side  were  applied to the wound along with an antibiotic ointment.  All needle  counts, sponge counts, and instrument counts were correct.  The patient was  taken to recovery in stable condition.                                               Kathrin Ruddy, M.D.    JW/MEDQ  D:  01/04/2003  T:  01/04/2003  Job:  [id number]

## 2011-02-02 NOTE — Op Note (Signed)
NAME:  Brian [name], Brian [name]                 ACCOUNT NO.:  [id number]   MEDICAL RECORD NO.:  [id number]          PATIENT TYPE:  INP   LOCATION:  NA                           FACILITY[facility]   PHYSICIAN:  Leonie Man, M.D.   DATE OF BIRTH:  May 27, 1936   DATE OF PROCEDURE:  11/01/2005  DATE OF DISCHARGE:                                 OPERATIVE REPORT   PREOPERATIVE DIAGNOSIS:  Recurrent atypical rectal polyp.   POSTOPERATIVE DIAGNOSIS:  Recurrent atypical rectal polyp.   PROCEDURE:  Transanal excision of rectal polyp.   SURGEON:  Leonie Man, M.D.   ASSISTANT:  Sheppard Plumber. Earlene Plater, M.D.   ANESTHESIA:  General.   SPECIMENS TO THE LAB:  Rectal polyp.   Minimal blood loss.   COMPLICATIONS:  None.   NOTE:  The patient is a 75 year old gentleman who has had a rectal polyp  which was located at approximately 10 cm from the anal verge.  He has had  this polyp endoscopically removed on three separate occasions, and the polyp  has recurred on each of these occasions.  The most recent pathologic report  shows atypia.  The patient comes to the operating room now for a transanal  excision of his polyp.   PROCEDURE:  Following the induction of general anesthesia, the patient is  placed in the prone jackknife position and the perianal tissues are prepped  and draped to be included in a sterile operative field.  Anal dilatation was  carried out after injection of the sphincter muscles with 0.25% Marcaine  with epinephrine and Wydase.  Following anal dilatation, the operating  anoscope is placed into the rectum and the region of the polyp is located on  the posterior lateral wall of the rectum approximately 10 cm from the anal  verge.  The lesion is grasped with a Babcock and the region around the  lesion is injected with 0.25% Marcaine with epinephrine.  A traction suture  is placed above the lesion and then the lesion is excised in its entirety  using electrocautery, getting full-thickness  mucosa and submucosa.  The  specimen was then removed and forwarded for pathologic evaluation.  Hemostasis is obtained with  electrocautery and the mucosa is then closed with interrupted sutures of 2-0  chromic catgut.  Sponge and instrument counts are verified.  I placed a  Gelfoam pack over the incision and the anesthetic was reversed.  The patient  was removed from the operating room to the recovery room in stable  condition.  He tolerated the procedure well.      Leonie Man, M.D.  Electronically Signed     PB/MEDQ  D:  11/01/2005  T:  11/02/2005  Job:  [id number]   cc:   Lina Sar, M.D. [facility]  [address]  [place]  [place] [address]

## 2011-02-19 ENCOUNTER — Ambulatory Visit (INDEPENDENT_AMBULATORY_CARE_PROVIDER_SITE_OTHER): Payer: Medicare Other

## 2011-02-19 DIAGNOSIS — Z23 Encounter for immunization: Secondary | ICD-10-CM

## 2011-05-22 ENCOUNTER — Other Ambulatory Visit: Payer: Self-pay

## 2011-05-22 MED ORDER — FINASTERIDE 5 MG PO TABS: 5.0000 mg | ORAL_TABLET | Freq: Every day | ORAL | Status: DC

## 2011-05-22 NOTE — Telephone Encounter (Signed)
Pt requested refill until upcoming appt.

## 2011-06-10 ENCOUNTER — Encounter: Payer: Self-pay | Admitting: Internal Medicine

## 2011-06-10 DIAGNOSIS — Z0001 Encounter for general adult medical examination with abnormal findings: Secondary | ICD-10-CM | POA: Insufficient documentation

## 2011-06-14 ENCOUNTER — Other Ambulatory Visit: Payer: Self-pay

## 2011-06-14 ENCOUNTER — Encounter: Payer: Self-pay | Admitting: Internal Medicine

## 2011-06-14 ENCOUNTER — Other Ambulatory Visit: Payer: Self-pay | Admitting: Internal Medicine

## 2011-06-14 ENCOUNTER — Ambulatory Visit (INDEPENDENT_AMBULATORY_CARE_PROVIDER_SITE_OTHER): Payer: Medicare Other | Admitting: Internal Medicine

## 2011-06-14 ENCOUNTER — Other Ambulatory Visit (INDEPENDENT_AMBULATORY_CARE_PROVIDER_SITE_OTHER): Payer: Medicare Other

## 2011-06-14 DIAGNOSIS — R5381 Other malaise: Secondary | ICD-10-CM

## 2011-06-14 DIAGNOSIS — E119 Type 2 diabetes mellitus without complications: Secondary | ICD-10-CM

## 2011-06-14 DIAGNOSIS — R5383 Other fatigue: Secondary | ICD-10-CM | POA: Insufficient documentation

## 2011-06-14 DIAGNOSIS — Z23 Encounter for immunization: Secondary | ICD-10-CM

## 2011-06-14 DIAGNOSIS — E785 Hyperlipidemia, unspecified: Secondary | ICD-10-CM

## 2011-06-14 DIAGNOSIS — I10 Essential (primary) hypertension: Secondary | ICD-10-CM

## 2011-06-14 LAB — CBC WITH DIFFERENTIAL/PLATELET
Basophils Absolute: 0.1 10*3/uL (ref 0.0–0.1)
Basophils Relative: 1.6 % (ref 0.0–3.0)
Eosinophils Absolute: 0.1 10*3/uL (ref 0.0–0.7)
Lymphocytes Relative: 33.7 % (ref 12.0–46.0)
MCHC: 32.1 g/dL (ref 30.0–36.0)
MCV: 84.5 fl (ref 78.0–100.0)
Monocytes Absolute: 0.4 10*3/uL (ref 0.1–1.0)
Neutrophils Relative %: 49.4 % (ref 43.0–77.0)
Platelets: 201 10*3/uL (ref 150.0–400.0)
RBC: 4.4 Mil/uL (ref 4.22–5.81)
RDW: 14.7 % — ABNORMAL HIGH (ref 11.5–14.6)

## 2011-06-14 MED ORDER — FLUTICASONE PROPIONATE 50 MCG/ACT NA SUSP: 2.0000 | Freq: Every day | NASAL | Status: DC

## 2011-06-14 MED ORDER — GLIMEPIRIDE 4 MG PO TABS: 4.0000 mg | ORAL_TABLET | Freq: Every day | ORAL | Status: DC

## 2011-06-14 MED ORDER — ATORVASTATIN CALCIUM 20 MG PO TABS: 20.0000 mg | ORAL_TABLET | Freq: Every day | ORAL | Status: DC

## 2011-06-14 MED ORDER — OLMESARTAN MEDOXOMIL 40 MG PO TABS: 40.0000 mg | ORAL_TABLET | Freq: Every day | ORAL | Status: DC

## 2011-06-14 MED ORDER — TADALAFIL 20 MG PO TABS: 10.0000 mg | ORAL_TABLET | ORAL | Status: DC | PRN

## 2011-06-14 MED ORDER — FINASTERIDE 5 MG PO TABS: 5.0000 mg | ORAL_TABLET | Freq: Every day | ORAL | Status: DC

## 2011-06-14 MED ORDER — TAMSULOSIN HCL 0.4 MG PO CAPS: 0.4000 mg | ORAL_CAPSULE | Freq: Every day | ORAL | Status: DC

## 2011-06-14 NOTE — Assessment & Plan Note (Signed)
stable overall by hx and exam, most recent data reviewed with pt, and pt to continue medical treatment as before  Lab Results  Component Value Date   LDLCALC 97 05/29/2010   To check labs today, for lower chol diet

## 2011-06-14 NOTE — Progress Notes (Signed)
Subjective:    Patient ID: Brian [name], male    DOB: 02-Oct-1935, 75 y.o.   MRN: [id number]  HPI  Here to f/u;  Needs all med refills in hardcopy;  Thinks lipitor may be causing intermittent constipation but not sure, willing to continue for now;  Has seen Dr Isabel Caprice with a slightly increased PSA and had a f/u PSA earlier this wk - has notbeen contacted about results yet;  Here to f/u; overall doing ok,  Pt denies chest pain, increased sob or doe, wheezing, orthopnea, PND, increased LE swelling, palpitations, dizziness or syncope.  Pt denies new neurological symptoms such as new headache, or facial or extremity weakness or numbness   Pt denies polydipsia, polyuria, or low sugar symptoms such as weakness or confusion improved with po intake.  Pt states overall good compliance with meds, trying to follow lower cholesterol, diabetic diet, wt overall stable but little exercise however.  Has not checked CBG in the psat 2 wks, but prior had been in the lower 100's.  Still walking occasionally, though wt up about 2-3 lbs.  Due for flu shot today.  Plays golf 3 times per wk.  Does have sense of ongoing fatigue, but denies signficant hypersomnolence.  Past Medical History  Diagnosis Date  . Diabetes mellitus type II   . Hyperlipidemia   . Hypertension   . BPH (benign prostatic hypertrophy)   . Obesity   . Chronic anemia   . History of prostatitis   . ED (erectile dysfunction)   . Right eye trauma     hx as child  . Allergic rhinitis   . Nephrolithiasis 12/07/2010  . ALLERGIC RHINITIS 10/13/2007  . ANEMIA, CHRONIC DISEASE NEC 03/31/2007  . BENIGN PROSTATIC HYPERTROPHY 03/31/2007  . DIABETES MELLITUS, TYPE II 03/31/2007  . DISC DISEASE, LUMBAR 03/31/2007  . HYPERLIPIDEMIA 03/31/2007  . HYPERTENSION 03/31/2007  . POLYP, ANAL AND RECTAL 03/31/2007   Past Surgical History  Procedure Date  . Rectal polypectomy     Transanal excision 10/2005  . Lumbar disc surgery     1999  . Prostate biopsy     s/p  .  Skin biopsy     s/p right upper back 2009- benign Dr. Danella Deis    reports that he has never smoked. He does not have any smokeless tobacco history on file. He reports that he drinks alcohol. He reports that he does not use illicit drugs. family history includes Colon cancer in his mother. Allergies  Allergen Reactions  . Metformin     REACTION: GI upset   Current Outpatient Prescriptions on File Prior to Visit  Medication Sig Dispense Refill  . aspirin 81 MG EC tablet Take 81 mg by mouth daily.        [name] atorvastatin (LIPITOR) 20 MG tablet Take 20 mg by mouth daily.        . finasteride (PROSCAR) 5 MG tablet Take 1 tablet (5 mg total) by mouth daily.  90 tablet  0  . fluticasone (FLONASE) 50 MCG/ACT nasal spray 2 sprays by Nasal route daily.        [name] glimepiride (AMARYL) 4 MG tablet Take 4 mg by mouth daily.       [name] olmesartan (BENICAR) 40 MG tablet Take 40 mg by mouth daily.        . tadalafil (CIALIS) 20 MG tablet Take 10 mg by mouth every other day as needed.        . Tamsulosin HCl (FLOMAX) 0.4 MG  CAPS Take 1 capsule (0.4 mg total) by mouth daily.  90 capsule  1   Review of Systems Review of Systems  Constitutional: Negative for diaphoresis and unexpected weight change.  HENT: Negative for drooling and tinnitus.   Eyes: Negative for photophobia and visual disturbance.  Respiratory: Negative for choking and stridor.   Gastrointestinal: Negative for vomiting and blood in stool.  Genitourinary: Negative for hematuria and decreased urine volume.    Objective:   Physical Exam BP 162/70  Pulse 70  Temp(Src) 98.1 F (36.7 C) (Oral)  Ht 6' (1.829 m)  Wt 231 lb 4 oz (104.894 kg)  BMI 31.36 kg/m2  SpO2 95% Physical Exam  VS noted Constitutional: Pt appears well-developed and well-nourished.  HENT: Head: Normocephalic.  Right Ear: External ear normal.  Left Ear: External ear normal.  Eyes: Conjunctivae and EOM are normal. Pupils are equal, round, and reactive to light.  Neck:  Normal range of motion. Neck supple.  Cardiovascular: Normal rate and regular rhythm.   Pulmonary/Chest: Effort normal and breath sounds normal.  Abd:  Soft, NT, non-distended, + BS Neurological: Pt is alert. No cranial nerve deficit.  Skin: Skin is warm. No erythema.  Psychiatric: Pt behavior is normal. Thought content normal.       Assessment & Plan:

## 2011-06-14 NOTE — Patient Instructions (Signed)
You had the flu shot today Continue all other medications as before Please cut out all extra table salt, and eat low salt, lower cholesterol diet Please check your Blood Pressure on a regular basis at home;  If your BP is still > 130 on the top number, please call and we should add amlodipine 10 mg per day You are given all the refills today Please go to LAB in the Basement for the blood and/or urine tests to be done today Please call the phone number [phone] (the [employer]Tree System) for results of testing in 2-3 days;  When calling, simply dial the number, and when prompted enter the MRN number above (the Medical Record Number) and the # key, then the message should start. Please return in 6 months, or sooner if needed

## 2011-06-14 NOTE — Telephone Encounter (Signed)
Patient is requesting a refill on atorvastatin 20 mg.  He stated was not filled at [facility] this morning.

## 2011-06-14 NOTE — Assessment & Plan Note (Signed)
stable overall by hx and exam, most recent data reviewed with pt, and pt to continue medical treatment as before  Lab Results  Component Value Date   HGBA1C 6.2 12/07/2010    To check labs today

## 2011-06-14 NOTE — Assessment & Plan Note (Signed)
Etiology unclear, Exam otherwise benign, to check labs as documented, follow with expectant management  

## 2011-06-14 NOTE — Assessment & Plan Note (Signed)
Uncontrolled, but for some reason is very reluctant to accept any further meds more than he is now taking;  I would add amlodipine 10, but he decliens, will work on low salt, more exercise, wt loss.  BP Readings from Last 3 Encounters:  06/14/11 162/70  12/07/10 118/72  05/29/10 162/78

## 2011-06-14 NOTE — Progress Notes (Signed)
Addended by: Scharlene Gloss B on: 06/14/2011 08:59 AM   Modules accepted: Orders

## 2011-06-15 LAB — LIPID PANEL
Cholesterol: 130 mg/dL (ref 0–200)
LDL Cholesterol: 67 mg/dL (ref 0–99)

## 2011-06-15 LAB — HEPATIC FUNCTION PANEL
ALT: 34 U/L (ref 0–53)
AST: 31 U/L (ref 0–37)
Alkaline Phosphatase: 62 U/L (ref 39–117)
Bilirubin, Direct: 0.1 mg/dL (ref 0.0–0.3)
Total Bilirubin: 0.6 mg/dL (ref 0.3–1.2)
Total Protein: 7.2 g/dL (ref 6.0–8.3)

## 2011-06-15 LAB — BASIC METABOLIC PANEL
CO2: 29 mEq/L (ref 19–32)
Chloride: 105 mEq/L (ref 96–112)
Potassium: 5.5 mEq/L — ABNORMAL HIGH (ref 3.5–5.1)
Sodium: 141 mEq/L (ref 135–145)

## 2011-06-15 LAB — MICROALBUMIN / CREATININE URINE RATIO
Creatinine,U: 144.1 mg/dL
Microalb Creat Ratio: 0.3 mg/g (ref 0.0–30.0)

## 2011-08-13 ENCOUNTER — Ambulatory Visit (INDEPENDENT_AMBULATORY_CARE_PROVIDER_SITE_OTHER): Payer: Medicare Other | Admitting: Internal Medicine

## 2011-08-13 ENCOUNTER — Telehealth: Payer: Self-pay

## 2011-08-13 ENCOUNTER — Encounter: Payer: Self-pay | Admitting: Internal Medicine

## 2011-08-13 ENCOUNTER — Ambulatory Visit (INDEPENDENT_AMBULATORY_CARE_PROVIDER_SITE_OTHER)
Admission: RE | Admit: 2011-08-13 | Discharge: 2011-08-13 | Disposition: A | Discharge status: Home / Self Care | Payer: Medicare Other | Source: Ambulatory Visit | Attending: Internal Medicine | Admitting: Internal Medicine

## 2011-08-13 DIAGNOSIS — E119 Type 2 diabetes mellitus without complications: Secondary | ICD-10-CM

## 2011-08-13 DIAGNOSIS — R079 Chest pain, unspecified: Secondary | ICD-10-CM

## 2011-08-13 DIAGNOSIS — I10 Essential (primary) hypertension: Secondary | ICD-10-CM

## 2011-08-13 MED ORDER — AMLODIPINE BESYLATE 5 MG PO TABS: 5.0000 mg | ORAL_TABLET | Freq: Every day | ORAL | Status: DC

## 2011-08-13 NOTE — Assessment & Plan Note (Signed)
.  stable overall by hx and exam, most recent data reviewed with pt, and pt to continue medical treatment as before  Lab Results  Component Value Date   HGBA1C 6.5 06/14/2011

## 2011-08-13 NOTE — Patient Instructions (Addendum)
Take all new medications as prescribed Continue all other medications as before Your EKG was ok today Please go to XRAY in the Basement for the x-Efren test You will be contacted regarding the referral for: stress test, and cardiology Please call the phone number [phone] (the PhoneTree System) for results of testing in 2-3 days;  When calling, simply dial the number, and when prompted enter the MRN number above (the Medical Record Number) and the # key, then the message should start.

## 2011-08-13 NOTE — Telephone Encounter (Signed)
Patient called to inform has been having elevated BP, heart racing and chest heaviness. Informed the patient would need to schedule OV today, he agreed to do so.

## 2011-08-13 NOTE — Assessment & Plan Note (Addendum)
Atypical, for stress test, and refer cardiology , cont asa and other current meds for now;  Also for cxr, and ECG reviewed as per emr

## 2011-08-13 NOTE — Progress Notes (Signed)
Subjective:    Patient ID: Brian [name], male    DOB: [birth year], 75 y.o.   MRN: [id number]  HPI Here to f/u, c/o 2 recent episodes chest pressure/pain to mid ant chest, heart racing feeling with SOB and nervousness;  Pt denies  wheezing, orthopnea, PND, increased LE swelling,  dizziness or syncope. No radiation, each lasted about 10 min, first very mild, second mild to mod and "got my attention" as he was riding the golf cart before he even started to play, so quit and did not play the golf.  Took ASA and went home to rest, occurred last Friday, no pain since then,  Concerned about heart disease and wonders if he needs to see cardiology.  Still feels anxious about the episodes, "like my blood pressure going up," ECG today with sinus, LAFB, no acute change. Last stress test approx 3-4 yrs per pt.    Pt denies fever, wt loss, night sweats, loss of appetite, or other constitutional symptoms  .  Is currently considering microwave therapy for BPH per urology after the first of the yr.  Last PSA improved  Per pt, not sure of numbner today, due f/u feb 2013.  BP at home has been mild elev at home with wife;s cuff.   Past Medical History  Diagnosis Date  . Diabetes mellitus type II   . Hyperlipidemia   . Hypertension   . BPH (benign prostatic hypertrophy)   . Obesity   . Chronic anemia   . History of prostatitis   . ED (erectile dysfunction)   . Right eye trauma     hx as child  . Allergic rhinitis   . Nephrolithiasis 12/07/2010  . ALLERGIC RHINITIS 10/13/2007  . ANEMIA, CHRONIC DISEASE NEC 03/31/2007  . BENIGN PROSTATIC HYPERTROPHY 03/31/2007  . DIABETES MELLITUS, TYPE II 03/31/2007  . DISC DISEASE, LUMBAR 03/31/2007  . HYPERLIPIDEMIA 03/31/2007  . HYPERTENSION 03/31/2007  . POLYP, ANAL AND RECTAL 03/31/2007   Past Surgical History  Procedure Date  . Rectal polypectomy     Transanal excision 10/2005  . Lumbar disc surgery     1999  . Prostate biopsy     s/p  . Skin biopsy     s/p right upper  back 2009- benign Dr. Danella Deis    reports that he has never smoked. He does not have any smokeless tobacco history on file. He reports that he drinks alcohol. He reports that he does not use illicit drugs. family history includes Colon cancer in his mother. Allergies  Allergen Reactions  . Metformin     REACTION: GI upset   Current Outpatient Prescriptions on File Prior to Visit  Medication Sig Dispense Refill  . aspirin 81 MG EC tablet Take 81 mg by mouth daily.        [name] atorvastatin (LIPITOR) 20 MG tablet Take 1 tablet (20 mg total) by mouth daily.  90 tablet  3  . finasteride (PROSCAR) 5 MG tablet Take 1 tablet (5 mg total) by mouth daily.  90 tablet  3  . fluticasone (FLONASE) 50 MCG/ACT nasal spray Place 2 sprays into the nose daily.  16 g  11  . glimepiride (AMARYL) 4 MG tablet Take 1 tablet (4 mg total) by mouth daily.  90 tablet  3  . olmesartan (BENICAR) 40 MG tablet Take 1 tablet (40 mg total) by mouth daily.  90 tablet  3  . tadalafil (CIALIS) 20 MG tablet Take 0.5 tablets (10 mg total) by mouth every  other day as needed.  10 tablet  11  . Tamsulosin HCl (FLOMAX) 0.4 MG CAPS Take 1 capsule (0.4 mg total) by mouth daily.  90 capsule  3   Review of Systems Review of Systems  Constitutional: Negative for diaphoresis and unexpected weight change.  HENT: Negative for drooling and tinnitus.   Eyes: Negative for photophobia and visual disturbance.  Respiratory: Negative for choking and stridor.   Gastrointestinal: Negative for vomiting and blood in stool.  Genitourinary: Negative for hematuria and decreased urine volume.  Musculoskeletal: Negative for gait problem.  Skin: Negative for color change and wound.  Neurological: Negative for tremors and numbness.  Psychiatric/Behavioral: Negative for decreased concentration. The patient is not hyperactive.       Objective:   Physical Exam BP 168/84  Pulse 59  Temp(Src) 98.1 F (36.7 C) (Oral)  Ht 6' (1.829 m)  Wt 235 lb 2 oz  (106.652 kg)  BMI 31.89 kg/m2  SpO2 97% Physical Exam  VS noted Constitutional: Pt appears well-developed and well-nourished.  HENT: Head: Normocephalic.  Right Ear: External ear normal.  Left Ear: External ear normal.  Eyes: Conjunctivae and EOM are normal. Pupils are equal, round, and reactive to light.  Neck: Normal range of motion. Neck supple.  Cardiovascular: Normal rate and regular rhythm.   Pulmonary/Chest: Effort normal and breath sounds normal.  Abd:  Soft, NT, non-distended, + BS Neurological: Pt is alert. No cranial nerve deficit.  Skin: Skin is warm. No erythema.  Psychiatric: Pt behavior is normal. Thought content normal. 1+ nervous    Assessment & Plan:

## 2011-08-13 NOTE — Assessment & Plan Note (Signed)
Mild uncontrolled, would to add beta blocker but already with HR 509 today, to add amlod 5 mg qd

## 2011-08-14 ENCOUNTER — Ambulatory Visit (INDEPENDENT_AMBULATORY_CARE_PROVIDER_SITE_OTHER): Payer: Medicare Other | Admitting: Cardiology

## 2011-08-14 ENCOUNTER — Encounter: Payer: Self-pay | Admitting: Cardiology

## 2011-08-14 DIAGNOSIS — R079 Chest pain, unspecified: Secondary | ICD-10-CM

## 2011-08-14 DIAGNOSIS — I10 Essential (primary) hypertension: Secondary | ICD-10-CM

## 2011-08-14 DIAGNOSIS — E119 Type 2 diabetes mellitus without complications: Secondary | ICD-10-CM

## 2011-08-14 NOTE — Assessment & Plan Note (Signed)
I strongly encouraged the patient to fill the prescription for amlodipine today and take his first tablet today.  He definitely needs blood pressure control.

## 2011-08-14 NOTE — Progress Notes (Signed)
HPI   Patient is seen today for new cardiac evaluation at the request of Dr.John.  The patient has diabetes and hypertension.  Amlodipine was prescribed recently and he planned to fill it today after he talked to me.  I encouraged him to proceed with this.  The patient has never had documented coronary disease.  He has had some vague chest discomfort.  It has been both at rest and with exertion.  There is been no diagnostic EKG change.  As part of today's evaluation I have reviewed the prior notes available.  In particular I reviewed the note from Dr. Jonny Ruiz dated August 13, 2011.  I also reviewed the EKG from that day.  Allergies  Allergen Reactions  . Metformin     REACTION: GI upset    Current Outpatient Prescriptions  Medication Sig Dispense Refill  . amLODipine (NORVASC) 5 MG tablet Take 1 tablet (5 mg total) by mouth daily.  90 tablet  3  . aspirin 81 MG EC tablet Take 81 mg by mouth daily.        [name] atorvastatin (LIPITOR) 20 MG tablet Take 1 tablet (20 mg total) by mouth daily.  90 tablet  3  . finasteride (PROSCAR) 5 MG tablet Take 1 tablet (5 mg total) by mouth daily.  90 tablet  3  . fluticasone (FLONASE) 50 MCG/ACT nasal spray Place 2 sprays into the nose daily.  16 g  11  . glimepiride (AMARYL) 4 MG tablet Take 1 tablet (4 mg total) by mouth daily.  90 tablet  3  . olmesartan (BENICAR) 40 MG tablet Take 1 tablet (40 mg total) by mouth daily.  90 tablet  3  . tadalafil (CIALIS) 20 MG tablet Take 0.5 tablets (10 mg total) by mouth every other day as needed.  10 tablet  11  . Tamsulosin HCl (FLOMAX) 0.4 MG CAPS Take 1 capsule (0.4 mg total) by mouth daily.  90 capsule  3    History   Social History  . Marital Status: Married    Spouse Name: N/A    Number of Children: 2  . Years of Education: N/A   Occupational History  . [employer] Engineer, structural     Retired 1999   Social History Main Topics  . Smoking status: Never Smoker   . Smokeless tobacco: Not on file  . Alcohol Use:  Yes  . Drug Use: No  . Sexually Active: Not on file   Other Topics Concern  . Not on file   Social History Narrative  . No narrative on file    Family History  Problem Relation Age of Onset  . Colon cancer Mother     Past Medical History  Diagnosis Date  . Diabetes mellitus type II   . Hyperlipidemia   . Hypertension   . BPH (benign prostatic hypertrophy)   . Obesity   . Chronic anemia   . History of prostatitis   . ED (erectile dysfunction)   . Right eye trauma     hx as child  . Allergic rhinitis   . Nephrolithiasis 12/07/2010  . ALLERGIC RHINITIS 10/13/2007  . ANEMIA, CHRONIC DISEASE NEC 03/31/2007  . BENIGN PROSTATIC HYPERTROPHY 03/31/2007  . DIABETES MELLITUS, TYPE II 03/31/2007  . DISC DISEASE, LUMBAR 03/31/2007  . HYPERLIPIDEMIA 03/31/2007  . HYPERTENSION 03/31/2007  . POLYP, ANAL AND RECTAL 03/31/2007    Past Surgical History  Procedure Date  . Rectal polypectomy     Transanal excision 10/2005  .  Lumbar disc surgery     1999  . Prostate biopsy     s/p  . Skin biopsy     s/p right upper back 2009- benign Dr. Danella Deis    ROS    Patient denies fever, chills, headache, sweats, rash, change in vision, change in hearing, cough, nausea vomiting, urinary symptoms.  All other systems are reviewed and are negative.  PHYSICAL EXAM  Patient is stable.  He is oriented to person time and place.  Affect is normal.  There is no jugulovenous distention.  Lungs are clear.  Respiratory effort is nonlabored.  Cardiac exam reveals S1-S2.  No clicks or significant murmurs.  Abdomen is soft.  There is no peripheral edema.  There no musculoskeletal deformities.  There no skin rashes.  Filed Vitals:   08/14/11 1443  BP: 193/87  Pulse: 60  Height: 6' (1.829 m)  Weight: 234 lb (106.142 kg)    EKG  I have reviewed the EKG from August 13, 2011.  There is left axis deviation.  ASSESSMENT & PLAN

## 2011-08-14 NOTE — Patient Instructions (Signed)
Your physician recommends that you schedule a follow-up appointment in: 3 WEEKS WITH DR Myrtis Ser Your physician has requested that you have a stress echocardiogram. For further information please visit [email or web address] Please follow instruction sheet as given. DX CHEST PAIN Your physician recommends that you continue on your current medications as directed. Please refer to the Current Medication list given to you today.MAKE SURE FILL AMLODIPINE 5 MG 1  EVERY DAY

## 2011-08-14 NOTE — Assessment & Plan Note (Signed)
At this point I am concerned about the patient's chest pain.  He does not appear to be unstable.  We will proceed with an exercise test.  After careful consideration I have decided to proceed with a stress echo.  This will be arranged at all see him for followup.

## 2011-08-14 NOTE — Assessment & Plan Note (Signed)
His diabetes is a definite risk factors for coronary disease.  His is an additional reason why decided to proceed with exercise testing.

## 2011-08-23 ENCOUNTER — Ambulatory Visit (HOSPITAL_COMMUNITY): Payer: Medicare Other | Attending: Cardiology | Admitting: Radiology

## 2011-08-23 ENCOUNTER — Ambulatory Visit (HOSPITAL_COMMUNITY): Payer: Medicare Other | Attending: Internal Medicine | Admitting: Radiology

## 2011-08-23 DIAGNOSIS — R0989 Other specified symptoms and signs involving the circulatory and respiratory systems: Secondary | ICD-10-CM

## 2011-08-23 DIAGNOSIS — I10 Essential (primary) hypertension: Secondary | ICD-10-CM | POA: Insufficient documentation

## 2011-08-23 DIAGNOSIS — E119 Type 2 diabetes mellitus without complications: Secondary | ICD-10-CM | POA: Insufficient documentation

## 2011-08-23 DIAGNOSIS — R072 Precordial pain: Secondary | ICD-10-CM

## 2011-08-23 DIAGNOSIS — E785 Hyperlipidemia, unspecified: Secondary | ICD-10-CM | POA: Insufficient documentation

## 2011-08-24 ENCOUNTER — Other Ambulatory Visit (HOSPITAL_COMMUNITY): Payer: Medicare Other | Admitting: Radiology

## 2011-08-27 ENCOUNTER — Encounter: Payer: Self-pay | Admitting: Cardiology

## 2011-08-27 DIAGNOSIS — R079 Chest pain, unspecified: Secondary | ICD-10-CM | POA: Insufficient documentation

## 2011-08-27 DIAGNOSIS — R9430 Abnormal result of cardiovascular function study, unspecified: Secondary | ICD-10-CM | POA: Insufficient documentation

## 2011-08-30 NOTE — Progress Notes (Signed)
N/A.  LMTC. 

## 2011-09-06 ENCOUNTER — Ambulatory Visit (INDEPENDENT_AMBULATORY_CARE_PROVIDER_SITE_OTHER): Payer: Medicare Other | Admitting: Cardiology

## 2011-09-06 ENCOUNTER — Encounter: Payer: Self-pay | Admitting: Cardiology

## 2011-09-06 VITALS — BP 173/92 | HR 71 | Ht 72.0 in | Wt 233.8 lb

## 2011-09-06 DIAGNOSIS — I10 Essential (primary) hypertension: Secondary | ICD-10-CM

## 2011-09-06 MED ORDER — AMLODIPINE BESYLATE 10 MG PO TABS: 10.0000 mg | ORAL_TABLET | Freq: Every day | ORAL | Status: DC

## 2011-09-06 NOTE — Patient Instructions (Signed)
Your physician recommends that you schedule a follow-up appointment in: only if needed  Your physician has recommended you make the following change in your medication: Increase your amlodipine to 10mg  daily

## 2011-09-06 NOTE — Assessment & Plan Note (Signed)
I have chosen to increase the amlodipine to 10 mg daily. He is scheduled to see his primary physician next month.

## 2011-09-06 NOTE — Progress Notes (Signed)
HPI  Patient is seen to followup evaluation of chest discomfort. I saw him in consultation August 14, 2011. At that time I encouraged him to start the blood pressure medication that had been prescribed. He has done this. He says that his systolic pressure at home has been in the range of 140 but increased to 150 and 160 this week. Here today his systolic is 170. On his stress echo he had a hypertensive response. He had normal resting LV function and there was no ischemia with stress. He has not had any return of the vague chest discomfort that he had.   Allergies  Allergen Reactions  . Metformin     REACTION: GI upset    Current Outpatient Prescriptions  Medication Sig Dispense Refill  . amLODipine (NORVASC) 5 MG tablet Take 1 tablet (5 mg total) by mouth daily.  90 tablet  3  . aspirin 81 MG EC tablet Take 81 mg by mouth daily.        [name] atorvastatin (LIPITOR) 20 MG tablet Take 1 tablet (20 mg total) by mouth daily.  90 tablet  3  . finasteride (PROSCAR) 5 MG tablet Take 1 tablet (5 mg total) by mouth daily.  90 tablet  3  . fluticasone (FLONASE) 50 MCG/ACT nasal spray Place 2 sprays into the nose daily.  16 g  11  . glimepiride (AMARYL) 4 MG tablet Take 1 tablet (4 mg total) by mouth daily.  90 tablet  3  . olmesartan (BENICAR) 40 MG tablet Take 1 tablet (40 mg total) by mouth daily.  90 tablet  3  . PATANOL 0.1 % ophthalmic solution as needed.      . tadalafil (CIALIS) 20 MG tablet Take 0.5 tablets (10 mg total) by mouth every other day as needed.  10 tablet  11  . Tamsulosin HCl (FLOMAX) 0.4 MG CAPS Take 1 capsule (0.4 mg total) by mouth daily.  90 capsule  3    History   Social History  . Marital Status: Married    Spouse Name: N/A    Number of Children: 2  . Years of Education: N/A   Occupational History  . [employer] Engineer, structural     Retired 1999   Social History Main Topics  . Smoking status: Never Smoker   . Smokeless tobacco: Not on file  . Alcohol Use: Yes  . Drug  Use: No  . Sexually Active: Not on file   Other Topics Concern  . Not on file   Social History Narrative  . No narrative on file    Family History  Problem Relation Age of Onset  . Colon cancer Mother     Past Medical History  Diagnosis Date  . Diabetes mellitus type II   . Hyperlipidemia   . Hypertension   . BPH (benign prostatic hypertrophy)   . Obesity   . Chronic anemia   . History of prostatitis   . ED (erectile dysfunction)   . Right eye trauma     hx as child  . Allergic rhinitis   . Nephrolithiasis 12/07/2010  . ALLERGIC RHINITIS 10/13/2007  . ANEMIA, CHRONIC DISEASE NEC 03/31/2007  . BENIGN PROSTATIC HYPERTROPHY 03/31/2007  . DIABETES MELLITUS, TYPE II 03/31/2007  . DISC DISEASE, LUMBAR 03/31/2007  . HYPERLIPIDEMIA 03/31/2007  . HYPERTENSION 03/31/2007  . POLYP, ANAL AND RECTAL 03/31/2007  . Chest pain     Stress echo, normal, December, 2012  . Ejection fraction     EF  normal, stress echo, December,  2012    Past Surgical History  Procedure Date  . Rectal polypectomy     Transanal excision 10/2005  . Lumbar disc surgery     1999  . Prostate biopsy     s/p  . Skin biopsy     s/p right upper back 2009- benign Dr. Danella Deis    ROS   Patient denies fever, chills, headache, sweats, rash, change in vision, change in hearing, chest pain, cough, nausea vomiting, urinary symptoms. All other systems are reviewed and are negative.  PHYSICAL EXAM  Patient is stable today. Lungs are clear. Respiratory effort is nonlabored. Cardiac exam reveals S1 and S2. There no clicks or significant murmurs. The abdomen is soft. There is no peripheral edema.  Filed Vitals:   09/06/11 1524  BP: 173/92  Pulse: 71  Height: 6' (1.829 m)  Weight: 233 lb 12.8 oz (106.051 kg)    ASSESSMENT & PLAN

## 2011-09-06 NOTE — Assessment & Plan Note (Signed)
The patient has had no return of the vague chest discomfort that he had. His stress echo showed no significant ischemia. I would recommend no further workup at this time. I would proceed with aggressive primary prevention.

## 2011-11-08 DIAGNOSIS — R3129 Other microscopic hematuria: Secondary | ICD-10-CM | POA: Diagnosis not present

## 2011-11-08 DIAGNOSIS — R972 Elevated prostate specific antigen [PSA]: Secondary | ICD-10-CM | POA: Diagnosis not present

## 2011-11-08 DIAGNOSIS — N401 Enlarged prostate with lower urinary tract symptoms: Secondary | ICD-10-CM | POA: Diagnosis not present

## 2011-11-08 DIAGNOSIS — N200 Calculus of kidney: Secondary | ICD-10-CM | POA: Diagnosis not present

## 2011-11-08 DIAGNOSIS — N138 Other obstructive and reflux uropathy: Secondary | ICD-10-CM | POA: Diagnosis not present

## 2011-12-13 ENCOUNTER — Ambulatory Visit (INDEPENDENT_AMBULATORY_CARE_PROVIDER_SITE_OTHER): Payer: Medicare Other | Admitting: Internal Medicine

## 2011-12-13 ENCOUNTER — Encounter: Payer: Self-pay | Admitting: Internal Medicine

## 2011-12-13 VITALS — BP 132/60 | HR 70 | Temp 98.2°F | Ht 72.0 in | Wt 226.5 lb

## 2011-12-13 DIAGNOSIS — E119 Type 2 diabetes mellitus without complications: Secondary | ICD-10-CM | POA: Diagnosis not present

## 2011-12-13 DIAGNOSIS — N400 Enlarged prostate without lower urinary tract symptoms: Secondary | ICD-10-CM | POA: Diagnosis not present

## 2011-12-13 DIAGNOSIS — E785 Hyperlipidemia, unspecified: Secondary | ICD-10-CM

## 2011-12-13 DIAGNOSIS — I10 Essential (primary) hypertension: Secondary | ICD-10-CM

## 2011-12-13 DIAGNOSIS — N529 Male erectile dysfunction, unspecified: Secondary | ICD-10-CM | POA: Insufficient documentation

## 2011-12-13 MED ORDER — VARDENAFIL HCL 20 MG PO TABS: 20.0000 mg | ORAL_TABLET | ORAL | Status: DC | PRN

## 2011-12-13 NOTE — Assessment & Plan Note (Signed)
Being considered for microwave tx, has PSA with urology yearly, proscar has led to somewhat decr libido per pt.  to f/u any worsening symptoms or concerns

## 2011-12-13 NOTE — Progress Notes (Signed)
Subjective:    Patient ID: Brian [name], male    DOB: [birth year], 76 y.o.   MRN: [id number]  HPI  Here to f/u; overall doing ok,  Pt denies chest pain, increased sob or doe, wheezing, orthopnea, PND, increased LE swelling, palpitations, dizziness or syncope, and recent stress echo normal dec 2012 with Dr Gwenlyn Saran.  Pt denies new neurological symptoms such as new headache, or facial or extremity weakness or numbness   Pt denies polydipsia, polyuria, or low sugar symptoms such as weakness or confusion improved with po intake.  Pt states overall good compliance with meds, trying to follow lower cholesterol, diabetic diet, wt overall stable.  Recent labs tests have been essentialy no change in the past 18-24 months.  Cialis may have caused some chest discomfort, no longer wants to take.  Has not tried cialis.  Sees urology, last visit approx 4 wks and is being considered for prostate microwave tx for BPH, and is he interested, inorder to get off the proscar, to help the libido.  Does have several wks ongoing nasal allergy symptoms with clear congestion, itch and sneeze, but minor so far this season. Past Medical History  Diagnosis Date  . Diabetes mellitus type II   . Hyperlipidemia   . Hypertension   . BPH (benign prostatic hypertrophy)   . Obesity   . Chronic anemia   . History of prostatitis   . ED (erectile dysfunction)   . Right eye trauma     hx as child  . Allergic rhinitis   . Nephrolithiasis 12/07/2010  . ALLERGIC RHINITIS 10/13/2007  . ANEMIA, CHRONIC DISEASE NEC 03/31/2007  . BENIGN PROSTATIC HYPERTROPHY 03/31/2007  . DIABETES MELLITUS, TYPE II 03/31/2007  . DISC DISEASE, LUMBAR 03/31/2007  . HYPERLIPIDEMIA 03/31/2007  . HYPERTENSION 03/31/2007  . POLYP, ANAL AND RECTAL 03/31/2007  . Chest pain     Stress echo, normal, December, 2012  . Ejection fraction     EF  normal, stress echo, December,  2012   Past Surgical History  Procedure Date  . Rectal polypectomy     Transanal excision  10/2005  . Lumbar disc surgery     1999  . Prostate biopsy     s/p  . Skin biopsy     s/p right upper back 2009- benign Dr. Danella Deis    reports that he has never smoked. He does not have any smokeless tobacco history on file. He reports that he drinks alcohol. He reports that he does not use illicit drugs. family history includes Colon cancer in his mother. Allergies  Allergen Reactions  . Metformin     REACTION: GI upset   Current Outpatient Prescriptions on File Prior to Visit  Medication Sig Dispense Refill  . amLODipine (NORVASC) 10 MG tablet Take 1 tablet (10 mg total) by mouth daily.  90 tablet  3  . aspirin 81 MG EC tablet Take 81 mg by mouth daily.        [name] atorvastatin (LIPITOR) 20 MG tablet Take 1 tablet (20 mg total) by mouth daily.  90 tablet  3  . finasteride (PROSCAR) 5 MG tablet Take 1 tablet (5 mg total) by mouth daily.  90 tablet  3  . fluticasone (FLONASE) 50 MCG/ACT nasal spray Place 2 sprays into the nose daily.  16 g  11  . glimepiride (AMARYL) 4 MG tablet Take 1 tablet (4 mg total) by mouth daily.  90 tablet  3  . olmesartan (BENICAR) 40 MG tablet Take  1 tablet (40 mg total) by mouth daily.  90 tablet  3  . PATANOL 0.1 % ophthalmic solution as needed.      . tadalafil (CIALIS) 20 MG tablet Take 0.5 tablets (10 mg total) by mouth every other day as needed.  10 tablet  11  . Tamsulosin HCl (FLOMAX) 0.4 MG CAPS Take 1 capsule (0.4 mg total) by mouth daily.  90 capsule  3  . vardenafil (LEVITRA) 20 MG tablet Take 1 tablet (20 mg total) by mouth as needed for erectile dysfunction.  10 tablet  11   Review of Systems Review of Systems  Constitutional: Negative for diaphoresis and unexpected weight change.  HENT: Negative for drooling and tinnitus.   Eyes: Negative for photophobia and visual disturbance.  Respiratory: Negative for choking and stridor.   Gastrointestinal: Negative for vomiting and blood in stool.  Genitourinary: Negative for hematuria and decreased urine  volume.  Objective:   Physical Exam BP 132/60  Pulse 70  Temp(Src) 98.2 F (36.8 C) (Oral)  Ht 6' (1.829 m)  Wt 226 lb 8 oz (102.74 kg)  BMI 30.72 kg/m2  SpO2 95% Physical Exam  VS noted Constitutional: Pt appears well-developed and well-nourished.  HENT: Head: Normocephalic.  Right Ear: External ear normal.  Left Ear: External ear normal.  Eyes: Conjunctivae and EOM are normal. Pupils are equal, round, and reactive to light.  Neck: Normal range of motion. Neck supple.  Cardiovascular: Normal rate and regular rhythm.   Pulmonary/Chest: Effort normal and breath sounds normal.  Neurological: Pt is alert. No cranial nerve deficit.  Skin: Skin is warm. No erythema.  Psychiatric: Pt behavior is normal. Thought content normal. not nervous or depressed affect    Assessment & Plan:

## 2011-12-13 NOTE — Patient Instructions (Addendum)
Ok to stop the [employer] the levitra 20 mg as needed (remember it is less expensive at [employer]) Continue all other medications as before Please keep your appointments with your specialists as you have planned - urology OK to hold off on lab work today Please return in 6 month, or sooner if needed

## 2011-12-13 NOTE — Assessment & Plan Note (Signed)
stable overall by hx and exam, most recent data reviewed with pt, and pt to continue medical treatment as before  Lab Results  Component Value Date   [facility] [place] 67 06/14/2011   Ok to hold off on further lab today

## 2011-12-13 NOTE — Assessment & Plan Note (Signed)
stable overall by hx and exam, most recent data reviewed with pt, and pt to continue medical treatment as before  Lab Results  Component Value Date   HGBA1C 6.5 06/14/2011   Ok to hold off on further labs today

## 2011-12-13 NOTE — Assessment & Plan Note (Signed)
cialis intol it seems with unusual chest discomfort ? Related and recent stess echo neg for ischemia (reveiwed with pt today)  Ok to change to levitra prn,  to f/u any worsening symptoms or concerns

## 2011-12-13 NOTE — Assessment & Plan Note (Signed)
stable overall by hx and exam, most recent data reviewed with pt, and pt to continue medical treatment as before  BP Readings from Last 3 Encounters:  12/13/11 132/60  09/06/11 173/92  08/14/11 193/87

## 2012-02-18 DIAGNOSIS — N401 Enlarged prostate with lower urinary tract symptoms: Secondary | ICD-10-CM | POA: Diagnosis not present

## 2012-02-18 DIAGNOSIS — N138 Other obstructive and reflux uropathy: Secondary | ICD-10-CM | POA: Diagnosis not present

## 2012-03-17 DIAGNOSIS — H40019 Open angle with borderline findings, low risk, unspecified eye: Secondary | ICD-10-CM | POA: Diagnosis not present

## 2012-03-17 DIAGNOSIS — E119 Type 2 diabetes mellitus without complications: Secondary | ICD-10-CM | POA: Diagnosis not present

## 2012-03-17 DIAGNOSIS — H2510 Age-related nuclear cataract, unspecified eye: Secondary | ICD-10-CM | POA: Diagnosis not present

## 2012-03-17 DIAGNOSIS — Z961 Presence of intraocular lens: Secondary | ICD-10-CM | POA: Diagnosis not present

## 2012-03-19 DIAGNOSIS — N138 Other obstructive and reflux uropathy: Secondary | ICD-10-CM | POA: Diagnosis not present

## 2012-03-19 DIAGNOSIS — N401 Enlarged prostate with lower urinary tract symptoms: Secondary | ICD-10-CM | POA: Diagnosis not present

## 2012-05-12 ENCOUNTER — Encounter: Payer: Self-pay | Admitting: Internal Medicine

## 2012-05-12 ENCOUNTER — Ambulatory Visit (INDEPENDENT_AMBULATORY_CARE_PROVIDER_SITE_OTHER): Payer: Medicare Other | Admitting: Internal Medicine

## 2012-05-12 VITALS — BP 140/76 | HR 74 | Temp 97.9°F | Ht 72.0 in | Wt 224.2 lb

## 2012-05-12 DIAGNOSIS — J209 Acute bronchitis, unspecified: Secondary | ICD-10-CM | POA: Diagnosis not present

## 2012-05-12 DIAGNOSIS — I10 Essential (primary) hypertension: Secondary | ICD-10-CM

## 2012-05-12 DIAGNOSIS — E119 Type 2 diabetes mellitus without complications: Secondary | ICD-10-CM | POA: Diagnosis not present

## 2012-05-12 MED ORDER — CEPHALEXIN 500 MG PO CAPS: 500.0000 mg | ORAL_CAPSULE | Freq: Four times a day (QID) | ORAL | Status: AC

## 2012-05-12 MED ORDER — HYDROCODONE-HOMATROPINE 5-1.5 MG/5ML PO SYRP: 5.0000 mL | ORAL_SOLUTION | Freq: Four times a day (QID) | ORAL | Status: AC | PRN

## 2012-05-12 NOTE — Progress Notes (Signed)
Subjective:    Patient ID: Brian [name], male    DOB: 23-Aug-1936, 76 y.o.   MRN: [id number]  HPI  Here with acute onset mild to mod 2-3 days ST, HA, general weakness and malaise, with prod cough greenish sputum, but Pt denies chest pain, increased sob or doe, wheezing, orthopnea, PND, increased LE swelling, palpitations, dizziness or syncope.  Pt denies new neurological symptoms such as new headache, or facial or extremity weakness or numbness   Pt denies polydipsia, polyuria, or low sugar symptoms such as weakness or confusion improved with po intake.  Pt states overall good compliance with meds, trying to follow lower cholesterol, diabetic diet, wt overall stable but little exercise however.  No other acute complaints Past Medical History  Diagnosis Date  . Diabetes mellitus type II   . Hyperlipidemia   . Hypertension   . BPH (benign prostatic hypertrophy)   . Obesity   . Chronic anemia   . History of prostatitis   . ED (erectile dysfunction)   . Right eye trauma     hx as child  . Allergic rhinitis   . Nephrolithiasis 12/07/2010  . ALLERGIC RHINITIS 10/13/2007  . ANEMIA, CHRONIC DISEASE NEC 03/31/2007  . BENIGN PROSTATIC HYPERTROPHY 03/31/2007  . DIABETES MELLITUS, TYPE II 03/31/2007  . DISC DISEASE, LUMBAR 03/31/2007  . HYPERLIPIDEMIA 03/31/2007  . HYPERTENSION 03/31/2007  . POLYP, ANAL AND RECTAL 03/31/2007  . Chest pain     Stress echo, normal, December, 2012  . Ejection fraction     EF  normal, stress echo, December,  2012   Past Surgical History  Procedure Date  . Rectal polypectomy     Transanal excision 10/2005  . Lumbar disc surgery     1999  . Prostate biopsy     s/p  . Skin biopsy     s/p right upper back 2009- benign Dr. Danella Deis    reports that he has never smoked. He does not have any smokeless tobacco history on file. He reports that he drinks alcohol. He reports that he does not use illicit drugs. family history includes Colon cancer in his mother. Allergies    Allergen Reactions  . Metformin     REACTION: GI upset   Current Outpatient Prescriptions on File Prior to Visit  Medication Sig Dispense Refill  . amLODipine (NORVASC) 10 MG tablet Take 1 tablet (10 mg total) by mouth daily.  90 tablet  3  . aspirin 81 MG EC tablet Take 81 mg by mouth daily.        [name] atorvastatin (LIPITOR) 20 MG tablet Take 1 tablet (20 mg total) by mouth daily.  90 tablet  3  . fluticasone (FLONASE) 50 MCG/ACT nasal spray Place 2 sprays into the nose daily.  16 g  11  . glimepiride (AMARYL) 4 MG tablet Take 1 tablet (4 mg total) by mouth daily.  90 tablet  3  . olmesartan (BENICAR) 40 MG tablet Take 1 tablet (40 mg total) by mouth daily.  90 tablet  3  . PATANOL 0.1 % ophthalmic solution as needed.      . tadalafil (CIALIS) 20 MG tablet Take 0.5 tablets (10 mg total) by mouth every other day as needed.  10 tablet  11  . finasteride (PROSCAR) 5 MG tablet Take 1 tablet (5 mg total) by mouth daily.  90 tablet  3  . Tamsulosin HCl (FLOMAX) 0.4 MG CAPS Take 1 capsule (0.4 mg total) by mouth daily.  90 capsule  3  . vardenafil (LEVITRA) 20 MG tablet Take 1 tablet (20 mg total) by mouth as needed for erectile dysfunction.  10 tablet  11   Review of Systems Review of Systems  Constitutional: Negative for  unexpected weight change.  HENT: Negative for drooling and tinnitus.   Eyes: Negative for photophobia and visual disturbance.  Respiratory: Negative for choking and stridor.   Gastrointestinal: Negative for vomiting and blood in stool.  Genitourinary: Negative for hematuria and decreased urine volume.  Musculoskeletal: Negative for gait problem.  Skin: Negative for color change and wound.  Neurological: Negative for tremors and numbness.     Objective:   Physical Exam BP 140/76  Pulse 74  Temp 97.9 F (36.6 C) (Oral)  Ht 6' (1.829 m)  Wt 224 lb 4 oz (101.719 kg)  BMI 30.41 kg/m2  SpO2 94% Physical Exam  VS noted, mild ill Constitutional: Pt appears  well-developed and well-nourished.  HENT: Head: Normocephalic.  Right Ear: External ear normal.  Left Ear: External ear normal.  Bilat tm's mild erythema.  Sinus nontender.  Pharynx mild erythema Eyes: Conjunctivae and EOM are normal. Pupils are equal, round, and reactive to light.  Neck: Normal range of motion. Neck supple.  Cardiovascular: Normal rate and regular rhythm.   Pulmonary/Chest: Effort normal and breath sounds normal.  Neurological: Pt is alert. Not confused Skin: Skin is warm. No erythema.  Psychiatric: Pt behavior is normal. Thought content normal.     Assessment & Plan:

## 2012-05-12 NOTE — Patient Instructions (Addendum)
Take all new medications as prescribed Continue all other medications as before You can also take  Mucinex (or it's generic off brand) for congestion  

## 2012-05-12 NOTE — Assessment & Plan Note (Signed)
Mild to mod, for antibx course,  to f/u any worsening symptoms or concerns 

## 2012-05-12 NOTE — Assessment & Plan Note (Signed)
Mild to mod, for antibx course,  to f/u any worsening symptoms or concerns BP Readings from Last 3 Encounters:  05/12/12 140/76  12/13/11 132/60  09/06/11 173/92

## 2012-05-26 DIAGNOSIS — H40019 Open angle with borderline findings, low risk, unspecified eye: Secondary | ICD-10-CM | POA: Diagnosis not present

## 2012-06-16 ENCOUNTER — Ambulatory Visit: Payer: Medicare Other | Admitting: Internal Medicine

## 2012-06-18 ENCOUNTER — Ambulatory Visit: Payer: Medicare Other | Admitting: Internal Medicine

## 2012-06-23 ENCOUNTER — Ambulatory Visit (INDEPENDENT_AMBULATORY_CARE_PROVIDER_SITE_OTHER): Payer: Medicare Other | Admitting: Internal Medicine

## 2012-06-23 ENCOUNTER — Encounter: Payer: Self-pay | Admitting: Internal Medicine

## 2012-06-23 ENCOUNTER — Other Ambulatory Visit (INDEPENDENT_AMBULATORY_CARE_PROVIDER_SITE_OTHER): Payer: Medicare Other

## 2012-06-23 VITALS — BP 138/78 | HR 65 | Temp 97.8°F | Ht 72.0 in | Wt 219.4 lb

## 2012-06-23 DIAGNOSIS — Z23 Encounter for immunization: Secondary | ICD-10-CM | POA: Diagnosis not present

## 2012-06-23 DIAGNOSIS — E119 Type 2 diabetes mellitus without complications: Secondary | ICD-10-CM

## 2012-06-23 DIAGNOSIS — R21 Rash and other nonspecific skin eruption: Secondary | ICD-10-CM | POA: Insufficient documentation

## 2012-06-23 DIAGNOSIS — I10 Essential (primary) hypertension: Secondary | ICD-10-CM

## 2012-06-23 DIAGNOSIS — R109 Unspecified abdominal pain: Secondary | ICD-10-CM | POA: Insufficient documentation

## 2012-06-23 LAB — CBC WITH DIFFERENTIAL/PLATELET
Eosinophils Absolute: 0.1 10*3/uL (ref 0.0–0.7)
Eosinophils Relative: 3.3 % (ref 0.0–5.0)
HCT: 37.8 % — ABNORMAL LOW (ref 39.0–52.0)
Lymphs Abs: 1.2 10*3/uL (ref 0.7–4.0)
MCHC: 31.7 g/dL (ref 30.0–36.0)
MCV: 80.9 fl (ref 78.0–100.0)
Monocytes Absolute: 0.4 10*3/uL (ref 0.1–1.0)
Neutrophils Relative %: 51.7 % (ref 43.0–77.0)
Platelets: 194 10*3/uL (ref 150.0–400.0)
RDW: 14.5 % (ref 11.5–14.6)

## 2012-06-23 LAB — BASIC METABOLIC PANEL
BUN: 14 mg/dL (ref 6–23)
Calcium: 9.4 mg/dL (ref 8.4–10.5)
GFR: 100.74 mL/min (ref >60.00)
Glucose, Bld: 354 mg/dL — ABNORMAL HIGH (ref 70–99)
Sodium: 134 mEq/L — ABNORMAL LOW (ref 135–145)

## 2012-06-23 LAB — HEMOGLOBIN A1C: Hgb A1c MFr Bld: 11.9 % — ABNORMAL HIGH (ref 4.6–6.5)

## 2012-06-23 LAB — HEPATIC FUNCTION PANEL
ALT: 25 U/L (ref 0–53)
AST: 20 U/L (ref 0–37)
Alkaline Phosphatase: 63 U/L (ref 39–117)
Bilirubin, Direct: 0.1 mg/dL (ref 0.0–0.3)
Total Bilirubin: 0.7 mg/dL (ref 0.3–1.2)

## 2012-06-23 LAB — LIPID PANEL
HDL: 53.9 mg/dL (ref >39.00)
Total CHOL/HDL Ratio: 3

## 2012-06-23 LAB — TSH: TSH: 1.03 u[IU]/mL (ref 0.35–5.50)

## 2012-06-23 MED ORDER — GLIMEPIRIDE 4 MG PO TABS: 2.0000 mg | ORAL_TABLET | Freq: Two times a day (BID) | ORAL | Status: DC

## 2012-06-23 MED ORDER — OLMESARTAN MEDOXOMIL 40 MG PO TABS: 20.0000 mg | ORAL_TABLET | Freq: Two times a day (BID) | ORAL | Status: DC

## 2012-06-23 MED ORDER — CLOBETASOL PROPIONATE 0.05 % EX OINT: TOPICAL_OINTMENT | Freq: Two times a day (BID) | CUTANEOUS | Status: DC

## 2012-06-23 MED ORDER — ATORVASTATIN CALCIUM 20 MG PO TABS: 20.0000 mg | ORAL_TABLET | Freq: Every day | ORAL | Status: DC

## 2012-06-23 MED ORDER — AMLODIPINE BESYLATE 10 MG PO TABS: 10.0000 mg | ORAL_TABLET | Freq: Every day | ORAL | Status: DC

## 2012-06-23 NOTE — Progress Notes (Signed)
Subjective:    Patient ID: Brian [name], male    DOB: May 15, 1936, 76 y.o.   MRN: [id number]  HPI  Here to f/u; has episode of GI "upset" but Denies worsening reflux, dysphagia, other abd pain, n/v, bowel change or blood., is assos with elev HR, seems to improve in 1-2 hrs after takes an ASA, has seen card/Dr Myrtis Ser for this as well, usually happens about lunch time and he has changed his diet but does not seem related, has not taken the cialis for 2-3 mo; now off the finasteride and flomax per Dr Grapey/urology with f/u planned s/p microwave prostate therapy - due about Sep 17 2012,    Symptoms ongoing for over a yr, mild  Of note had stress echo normal dec 2012.  He thinks may be med related, though takes meds in the AM, and symptoms around noon.  Bp stable.  Last lab eval sept 2012. Pt denies chest pain, increased sob or doe, wheezing, orthopnea, PND, increased LE swelling, palpitations, dizziness or syncope. Pt denies new neurological symptoms such as new headache, or facial or extremity weakness or numbness.   Pt denies polydipsia, polyuria, or low sugar symptoms such as weakness or confusion improved with po intake, though thinks his GI upset about noon no like a low sugar, though he hasnt checked, and is taking his glimeparide at half bid .  Pt states overall good compliance with meds, trying to follow lower cholesterol, diabetic diet, wt overall stable but little exercise however.     Pt denies fever, wt loss, night sweats, loss of appetite, or other constitutional symptoms Past Medical History  Diagnosis Date  . Diabetes mellitus type II   . Hyperlipidemia   . Hypertension   . BPH (benign prostatic hypertrophy)   . Obesity   . Chronic anemia   . History of prostatitis   . ED (erectile dysfunction)   . Right eye trauma     hx as child  . Allergic rhinitis   . Nephrolithiasis 12/07/2010  . ALLERGIC RHINITIS 10/13/2007  . ANEMIA, CHRONIC DISEASE NEC 03/31/2007  . BENIGN PROSTATIC HYPERTROPHY  03/31/2007  . DIABETES MELLITUS, TYPE II 03/31/2007  . DISC DISEASE, LUMBAR 03/31/2007  . HYPERLIPIDEMIA 03/31/2007  . HYPERTENSION 03/31/2007  . POLYP, ANAL AND RECTAL 03/31/2007  . Chest pain     Stress echo, normal, December, 2012  . Ejection fraction     EF  normal, stress echo, December,  2012   Past Surgical History  Procedure Date  . Rectal polypectomy     Transanal excision 10/2005  . Lumbar disc surgery     1999  . Prostate biopsy     s/p  . Skin biopsy     s/p right upper back 2009- benign Dr. Danella Deis    reports that he has never smoked. He does not have any smokeless tobacco history on file. He reports that he drinks alcohol. He reports that he does not use illicit drugs. family history includes Colon cancer in his mother. Allergies  Allergen Reactions  . Metformin     REACTION: GI upset   Current Outpatient Prescriptions on File Prior to Visit  Medication Sig Dispense Refill  . amLODipine (NORVASC) 10 MG tablet Take 1 tablet (10 mg total) by mouth daily.  90 tablet  3  . aspirin 81 MG EC tablet Take 81 mg by mouth daily.        [name] atorvastatin (LIPITOR) 20 MG tablet Take 1 tablet (20 mg  total) by mouth daily.  90 tablet  3  . fluticasone (FLONASE) 50 MCG/ACT nasal spray Place 2 sprays into the nose daily.  16 g  11  . glimepiride (AMARYL) 4 MG tablet Take 1 tablet (4 mg total) by mouth daily.  90 tablet  3  . olmesartan (BENICAR) 40 MG tablet Take 1 tablet (40 mg total) by mouth daily.  90 tablet  3  . PATANOL 0.1 % ophthalmic solution as needed.      . tadalafil (CIALIS) 20 MG tablet Take 0.5 tablets (10 mg total) by mouth every other day as needed.  10 tablet  11  . finasteride (PROSCAR) 5 MG tablet Take 1 tablet (5 mg total) by mouth daily.  90 tablet  3  . Tamsulosin HCl (FLOMAX) 0.4 MG CAPS Take 1 capsule (0.4 mg total) by mouth daily.  90 capsule  3  . vardenafil (LEVITRA) 20 MG tablet Take 1 tablet (20 mg total) by mouth as needed for erectile dysfunction.  10  tablet  11   Review of Systems  Constitutional: Negative for diaphoresis and unexpected weight change.  HENT: Negative for tinnitus.   Eyes: Negative for photophobia and visual disturbance.  Respiratory: Negative for choking and stridor.   Gastrointestinal: Negative for vomiting and blood in stool.  Genitourinary: Negative for hematuria and decreased urine volume.  Musculoskeletal: Negative for gait problem.  Skin: Negative for color change and wound.  Neurological: Negative for tremors and numbness.  Psychiatric/Behavioral: Negative for decreased concentration. The patient is not hyperactive.       Objective:   Physical Exam Physical Exam  VS noted Constitutional: Pt appears well-developed and well-nourished.  HENT: Head: Normocephalic.  Right Ear: External ear normal.  Left Ear: External ear normal.  Eyes: Conjunctivae and EOM are normal. Pupils are equal, round, and reactive to light.  Neck: Normal range of motion. Neck supple.  Cardiovascular: Normal rate and regular rhythm.   Pulmonary/Chest: Effort normal and breath sounds normal.  Abd:  Soft, NT, non-distended, + BS Neurological: Pt is alert. Not confused  Skin: Skin is warm. No erythema.  Psychiatric: Pt behavior is normal. Thought content normal. 1+ nervous    Assessment & Plan:

## 2012-06-23 NOTE — Patient Instructions (Addendum)
You had the flu shot today You are given the hardcopy of your refills today OK to take the benicar at 20 mg twice per day, and the glimeparide at half of the 4 mg tab twice per day as you have been doing Please also try Prilosec OTC 20 mg per day for at least 1 week to see if reflux or acid could be part of the problem Please also check your blood sugar if you can at the time of the episodes to make sure the sugar is not low Please go to LAB in the Basement for the blood and/or urine tests to be done today You will be contacted by phone if any changes need to be made immediately.  Otherwise, you will receive a letter about your results with an explanation. Please remember to sign up for My Chart at your earliest convenience, as this will be important to you in the future with finding out test results. Please keep your appointments with your specialists as you have planned - Dr Grapey/urology in dec 2013 as planned Please return in 6 months, or sooner if needed

## 2012-06-23 NOTE — Assessment & Plan Note (Signed)
Has seen derm, with recurrent rash, to refill clobetasol prn

## 2012-06-23 NOTE — Assessment & Plan Note (Signed)
stable overall by hx and exam, most recent data reviewed with pt, and pt to continue medical treatment as before Lab Results  Component Value Date   HGBA1C 6.5 06/14/2011   for lab today

## 2012-06-23 NOTE — Assessment & Plan Note (Signed)
Unclear etiology, symptoms ? Dyspeptic vs low sugar vs other; to check cbg at time of symptoms, to take benicar 20 bid, add prilosec 20 qd otc trial; exam benign, for labs today

## 2012-06-23 NOTE — Assessment & Plan Note (Signed)
stable overall by hx and exam, most recent data reviewed with pt, and pt to continue medical treatment as before;  Pt asks to change the benicar to 20 bid

## 2012-06-24 ENCOUNTER — Telehealth: Payer: Self-pay | Admitting: Internal Medicine

## 2012-06-24 DIAGNOSIS — F22 Delusional disorders: Secondary | ICD-10-CM

## 2012-06-24 DIAGNOSIS — IMO0001 Reserved for inherently not codable concepts without codable children: Secondary | ICD-10-CM

## 2012-06-24 MED ORDER — GLIMEPIRIDE 4 MG PO TABS: 4.0000 mg | ORAL_TABLET | Freq: Two times a day (BID) | ORAL | Status: DC

## 2012-06-24 MED ORDER — SITAGLIPTIN PHOSPHATE 100 MG PO TABS: 100.0000 mg | ORAL_TABLET | Freq: Every day | ORAL | Status: DC

## 2012-06-24 NOTE — Telephone Encounter (Signed)
Message copied by Corwin Levins on Tue Jun 24, 2012 12:49 PM ------      Message from: Pincus Sanes      Created: Mon Jun 23, 2012  2:09 PM       Called the patient informed of results and medication information.  The patient stated he has been taking the glimeparide (only missed a couple days) please advise.

## 2012-06-24 NOTE — Telephone Encounter (Signed)
In this case, he has severe uncontrolled sugar compared to last visit with "sudden" increase of the a1c; if he is taking his med, then I suspect dietary problem      1)  To increase the glimeparide to 4 mg bid (now at 1/2 of the 4 mg bid)- [facility]     2)   Start januvia 100 qd - [facility]     3)   To refer DM education - done per emr  Robin to let pt know, thanks  rx also done hardcopy as I remember he wanted all of his meds in hardcopy last visit

## 2012-06-24 NOTE — Telephone Encounter (Signed)
Called the patient and he agreed to increase the glimepiride, but did not agree to start Januvia.  He stated he did not want to add another med. At this time.  He also did not want the referral to Diabetic Ed. Class as has already gone.  Hardcopy on your desk of both prescriptions, he stated he did not need the glimepiride as would increase per MD instructions and had plenty.

## 2012-06-24 NOTE — Telephone Encounter (Signed)
Noted  

## 2012-07-01 ENCOUNTER — Other Ambulatory Visit: Payer: Self-pay

## 2012-07-01 MED ORDER — GLIMEPIRIDE 4 MG PO TABS: 4.0000 mg | ORAL_TABLET | Freq: Two times a day (BID) | ORAL | Status: DC

## 2012-07-16 ENCOUNTER — Other Ambulatory Visit: Payer: Self-pay | Admitting: Internal Medicine

## 2012-07-24 DIAGNOSIS — N138 Other obstructive and reflux uropathy: Secondary | ICD-10-CM | POA: Diagnosis not present

## 2012-07-24 DIAGNOSIS — R3129 Other microscopic hematuria: Secondary | ICD-10-CM | POA: Diagnosis not present

## 2012-07-24 DIAGNOSIS — N401 Enlarged prostate with lower urinary tract symptoms: Secondary | ICD-10-CM | POA: Diagnosis not present

## 2012-07-24 DIAGNOSIS — N529 Male erectile dysfunction, unspecified: Secondary | ICD-10-CM | POA: Diagnosis not present

## 2012-12-22 ENCOUNTER — Other Ambulatory Visit (INDEPENDENT_AMBULATORY_CARE_PROVIDER_SITE_OTHER): Payer: Medicare Other

## 2012-12-22 ENCOUNTER — Encounter: Payer: Self-pay | Admitting: Internal Medicine

## 2012-12-22 ENCOUNTER — Ambulatory Visit (INDEPENDENT_AMBULATORY_CARE_PROVIDER_SITE_OTHER): Payer: Medicare Other | Admitting: Internal Medicine

## 2012-12-22 VITALS — BP 152/72 | HR 67 | Temp 97.7°F | Ht 72.0 in | Wt 226.2 lb

## 2012-12-22 DIAGNOSIS — E119 Type 2 diabetes mellitus without complications: Secondary | ICD-10-CM

## 2012-12-22 DIAGNOSIS — I10 Essential (primary) hypertension: Secondary | ICD-10-CM

## 2012-12-22 DIAGNOSIS — J309 Allergic rhinitis, unspecified: Secondary | ICD-10-CM

## 2012-12-22 DIAGNOSIS — E785 Hyperlipidemia, unspecified: Secondary | ICD-10-CM

## 2012-12-22 LAB — HEPATIC FUNCTION PANEL
ALT: 24 U/L (ref 0–53)
AST: 24 U/L (ref 0–37)
Alkaline Phosphatase: 75 U/L (ref 39–117)
Bilirubin, Direct: 0.2 mg/dL (ref 0.0–0.3)
Total Protein: 7.5 g/dL (ref 6.0–8.3)

## 2012-12-22 LAB — BASIC METABOLIC PANEL
CO2: 27 mEq/L (ref 19–32)
Chloride: 100 mEq/L (ref 96–112)
Creatinine, Ser: 1 mg/dL (ref 0.4–1.5)

## 2012-12-22 LAB — LIPID PANEL
Total CHOL/HDL Ratio: 3
Triglycerides: 90 mg/dL (ref 0.0–149.0)

## 2012-12-22 MED ORDER — AMLODIPINE BESYLATE 10 MG PO TABS: 10.0000 mg | ORAL_TABLET | Freq: Every day | ORAL | Status: DC

## 2012-12-22 MED ORDER — FLUTICASONE PROPIONATE 50 MCG/ACT NA SUSP: 2.0000 | Freq: Every day | NASAL | Status: DC

## 2012-12-22 MED ORDER — GLIMEPIRIDE 4 MG PO TABS: 4.0000 mg | ORAL_TABLET | Freq: Two times a day (BID) | ORAL | Status: DC

## 2012-12-22 MED ORDER — OLOPATADINE HCL 0.1 % OP SOLN: 1.0000 [drp] | OPHTHALMIC | Status: DC | PRN

## 2012-12-22 MED ORDER — SITAGLIPTIN PHOSPHATE 100 MG PO TABS: 100.0000 mg | ORAL_TABLET | Freq: Every day | ORAL | Status: DC

## 2012-12-22 MED ORDER — ATORVASTATIN CALCIUM 20 MG PO TABS: 20.0000 mg | ORAL_TABLET | Freq: Every day | ORAL | Status: DC

## 2012-12-22 MED ORDER — OLMESARTAN MEDOXOMIL 40 MG PO TABS: 20.0000 mg | ORAL_TABLET | Freq: Two times a day (BID) | ORAL | Status: DC

## 2012-12-22 MED ORDER — METHYLPREDNISOLONE ACETATE 80 MG/ML IJ SUSP
80.0000 mg | Freq: Once | INTRAMUSCULAR | Status: AC
  Administered 2012-12-22: 80 mg via INTRAMUSCULAR

## 2012-12-22 NOTE — Assessment & Plan Note (Signed)
stable overall by history and exam, recent data reviewed with pt, and pt to continue medical treatment as before,  to f/u any worsening symptoms or concerns Lab Results  Component Value Date   HGBA1C 11.9* 06/23/2012   Pt has been back to better diet, for a1c f/u today

## 2012-12-22 NOTE — Assessment & Plan Note (Signed)
Mild to mod, for depomedrol IM and re-start chronic meds,  to f/u any worsening symptoms or concerns

## 2012-12-22 NOTE — Patient Instructions (Signed)
Please continue all other medications as before, and refills have been done if requested. Please continue your efforts at being more active, low cholesterol diabetic diet, and weight control. Please go to the LAB in the Basement (turn left off the elevator) for the tests to be done today You will be contacted by phone if any changes need to be made immediately.  Otherwise, you will receive a letter about your results with an explanation, but please check with MyChart first. Thank you for enrolling in MyChart. Please follow the instructions below to securely access your online medical record. MyChart allows you to send messages to your doctor, view your test results, renew your prescriptions, schedule appointments, and more. To Log into My Chart online, please go by [employer] or [employer] to [email or web address], or download the MyChart App from the [employer] of [employer] .  Your Username is: [email or web address], (pass rachel) Please send a Engineer, water on Mychart later today. Please return in 6 months, or sooner if needed, with Lab testing done 3-5 days before

## 2012-12-22 NOTE — Assessment & Plan Note (Signed)
stable overall by history and exam, recent data reviewed with pt, and pt to continue medical treatment as before,  to f/u any worsening symptoms or concerns BP Readings from Last 3 Encounters:  12/22/12 152/72  06/23/12 138/78  05/12/12 140/76   Pt declines med change today, to cont f/u at home

## 2012-12-22 NOTE — Addendum Note (Signed)
Addended by: Scharlene Gloss B on: 12/22/2012 11:39 AM   Modules accepted: Orders

## 2012-12-22 NOTE — Assessment & Plan Note (Signed)
stable overall by history and exam, recent data reviewed with pt, and pt to continue medical treatment as before,  to f/u any worsening symptoms or concerns Lab Results  Component Value Date   LDLCALC 103* 06/23/2012

## 2012-12-22 NOTE — Progress Notes (Signed)
Subjective:    Patient ID: Brian [name], male    DOB: [birth year], 77 y.o.   MRN: [id number]  HPI  Here to f/u; overall doing ok,  Pt denies chest pain, increased sob or doe, wheezing, orthopnea, PND, increased LE swelling, palpitations, dizziness or syncope.  Pt denies polydipsia, polyuria, or low sugar symptoms such as weakness or confusion improved with po intake.  Pt denies new neurological symptoms such as new headache, or facial or extremity weakness or numbness.   Pt states overall good compliance with meds, has been trying to follow lower cholesterol, diabetic diet, with wt overall stable,  but little exercise however.  Denies worsening depressive symptoms, suicidal ideation, or panic; has ongoing anxiety, not increased recently.   Does have several wks ongoing nasal allergy symptoms with clearish congestion, itch and sneezing, without fever, pain, ST, cough, swelling or wheezing.  BP has been increased somewhat with advil use per pt, usually < 140/90 Past Medical History  Diagnosis Date  . Diabetes mellitus type II   . Hyperlipidemia   . Hypertension   . BPH (benign prostatic hypertrophy)   . Obesity   . Chronic anemia   . History of prostatitis   . ED (erectile dysfunction)   . Right eye trauma     hx as child  . Allergic rhinitis   . Nephrolithiasis 12/07/2010  . ALLERGIC RHINITIS 10/13/2007  . ANEMIA, CHRONIC DISEASE NEC 03/31/2007  . BENIGN PROSTATIC HYPERTROPHY 03/31/2007  . DIABETES MELLITUS, TYPE II 03/31/2007  . DISC DISEASE, LUMBAR 03/31/2007  . HYPERLIPIDEMIA 03/31/2007  . HYPERTENSION 03/31/2007  . POLYP, ANAL AND RECTAL 03/31/2007  . Chest pain     Stress echo, normal, December, 2012  . Ejection fraction     EF  normal, stress echo, December,  2012   Past Surgical History  Procedure Laterality Date  . Rectal polypectomy      Transanal excision 10/2005  . Lumbar disc surgery      1999  . Prostate biopsy      s/p  . Skin biopsy      s/p right upper back 2009- benign  Dr. Danella Deis    reports that he has never smoked. He does not have any smokeless tobacco history on file. He reports that  drinks alcohol. He reports that he does not use illicit drugs. family history includes Colon cancer in his mother. Allergies  Allergen Reactions  . Metformin     REACTION: GI upset   Current Outpatient Prescriptions on File Prior to Visit  Medication Sig Dispense Refill  . aspirin 81 MG EC tablet Take 81 mg by mouth daily.        . clobetasol ointment (TEMOVATE) 0.05 % Apply topically 2 (two) times daily.  30 g  1  . glimepiride (AMARYL) 4 MG tablet Take 1 tablet (4 mg total) by mouth 2 (two) times daily.  180 tablet  3  . tadalafil (CIALIS) 20 MG tablet Take 0.5 tablets (10 mg total) by mouth every other day as needed.  10 tablet  11  . vardenafil (LEVITRA) 20 MG tablet Take 1 tablet (20 mg total) by mouth as needed for erectile dysfunction.  10 tablet  11   No current facility-administered medications on file prior to visit.   Review of Systems  Constitutional: Negative for unexpected weight change, or unusual diaphoresis  HENT: Negative for tinnitus.   Eyes: Negative for photophobia and visual disturbance.  Respiratory: Negative for choking and stridor.  Gastrointestinal: Negative for vomiting and blood in stool.  Genitourinary: Negative for hematuria and decreased urine volume.  Musculoskeletal: Negative for acute joint swelling Skin: Negative for color change and wound.  Neurological: Negative for tremors and numbness other than noted  Psychiatric/Behavioral: Negative for decreased concentration or  hyperactivity.       Objective:   Physical Exam BP 152/72  Pulse 67  Temp(Src) 97.7 F (36.5 C) (Oral)  Ht 6' (1.829 m)  Wt 226 lb 4 oz (102.626 kg)  BMI 30.68 kg/m2  SpO2 97% VS noted,  Constitutional: Pt appears well-developed and well-nourished.  HENT: Head: NCAT.  Right Ear: External ear normal.  Left Ear: External ear normal.  Bilat tm's with  mild erythema.  Max sinus areas non tender.  Pharynx with mild erythema, no exudate Eyes: Conjunctivae and EOM are normal. Pupils are equal, round, and reactive to light.  Neck: Normal range of motion. Neck supple.  Cardiovascular: Normal rate and regular rhythm.   Pulmonary/Chest: Effort normal and breath sounds normal.  Neurological: Pt is alert. Not confused  Skin: Skin is warm. No erythema.  Psychiatric: Pt behavior is normal. Thought content normal.     Assessment & Plan:

## 2012-12-23 ENCOUNTER — Telehealth: Payer: Self-pay | Admitting: Internal Medicine

## 2012-12-23 MED ORDER — METFORMIN HCL ER 500 MG PO TB24: 500.0000 mg | ORAL_TABLET | Freq: Every day | ORAL | Status: DC

## 2012-12-23 NOTE — Telephone Encounter (Signed)
Ok to add metformin ER 500 - 2 in the AM for now, and re-check next visit, but please stress the importance of diet as the oral medications seem to have gotten to the point they may not do the job of good control by themselves  Also should be checking sugars at home once daily if not doing this; would need nurse visit to learn to do CBG and rx for supplies/glucometer if needed, just let me know  Pt should call with persistent blood sugars > 180-200

## 2012-12-23 NOTE — Telephone Encounter (Signed)
Message copied by Corwin Levins on Tue Dec 23, 2012  5:11 PM ------      Message from: Scharlene Gloss B      Created: Tue Dec 23, 2012  4:02 PM       Called the patient and he has been taking his medications daily.  He did ask if ok to return before scheduled to recheck and give him once more try before going to insulin.? ------

## 2012-12-24 MED ORDER — GLUCOSE BLOOD VI STRP: ORAL_STRIP | Status: DC

## 2012-12-24 MED ORDER — METFORMIN HCL ER 500 MG PO TB24: ORAL_TABLET | ORAL | Status: DC

## 2012-12-24 NOTE — Telephone Encounter (Signed)
Called informed the patient of MD instructions.  He stated he knows how to check BS, but has not consistently been doing but will certainly do as MD instructed.  Will send in strips to local pharmacy as he requested and metformin to mail order.

## 2012-12-25 ENCOUNTER — Other Ambulatory Visit: Payer: Self-pay

## 2012-12-25 MED ORDER — GLUCOSE BLOOD VI STRP: ORAL_STRIP | Status: DC

## 2013-03-02 ENCOUNTER — Ambulatory Visit (INDEPENDENT_AMBULATORY_CARE_PROVIDER_SITE_OTHER): Payer: Medicare Other | Admitting: Internal Medicine

## 2013-03-02 ENCOUNTER — Encounter: Payer: Self-pay | Admitting: Internal Medicine

## 2013-03-02 VITALS — BP 160/72 | HR 72 | Temp 98.1°F | Ht 72.0 in | Wt 223.5 lb

## 2013-03-02 DIAGNOSIS — I10 Essential (primary) hypertension: Secondary | ICD-10-CM | POA: Diagnosis not present

## 2013-03-02 DIAGNOSIS — E119 Type 2 diabetes mellitus without complications: Secondary | ICD-10-CM | POA: Diagnosis not present

## 2013-03-02 DIAGNOSIS — J069 Acute upper respiratory infection, unspecified: Secondary | ICD-10-CM | POA: Diagnosis not present

## 2013-03-02 MED ORDER — AZITHROMYCIN 250 MG PO TABS: ORAL_TABLET | ORAL | Status: DC

## 2013-03-02 MED ORDER — HYDROCODONE-HOMATROPINE 5-1.5 MG/5ML PO SYRP: 5.0000 mL | ORAL_SOLUTION | Freq: Four times a day (QID) | ORAL | Status: DC | PRN

## 2013-03-02 NOTE — Assessment & Plan Note (Signed)
Mild to mod, for antibx course,  to f/u any worsening symptoms or concerns 

## 2013-03-02 NOTE — Assessment & Plan Note (Signed)
stable overall by history and exam, recent data reviewed with pt, and pt to continue medical treatment as before,  to f/u any worsening symptoms or concerns BP Readings from Last 3 Encounters:  03/02/13 160/72  12/22/12 152/72  06/23/12 138/78

## 2013-03-02 NOTE — Assessment & Plan Note (Signed)
stable overall by history and exam, recent data reviewed with pt, and pt to continue medical treatment as before,  to f/u any worsening symptoms or concerns Lab Results  Component Value Date   HGBA1C 10.7* 12/22/2012

## 2013-03-02 NOTE — Progress Notes (Signed)
Subjective:    Patient ID: Brian [name], male    DOB: 27-Feb-1936, 77 y.o.   MRN: [id number]  HPI   Here with 2-3 days acute onset fever, facial pain, pressure, headache, general weakness and malaise, and greenish d/c, with mild ST and cough, but pt denies chest pain, wheezing, increased sob or doe, orthopnea, PND, increased LE swelling, palpitations, dizziness or syncope. BP at home < 130/85.   Pt denies polydipsia, polyuria, or low sugar symptoms such as weakness or confusion improved with po intake.  Pt states overall good compliance with meds, trying to follow lower cholesterol, diabetic diet, wt overall stable but little exercise however.   Now very strict about diet and cbg's low 100's Past Medical History  Diagnosis Date  . Diabetes mellitus type II   . Hyperlipidemia   . Hypertension   . BPH (benign prostatic hypertrophy)   . Obesity   . Chronic anemia   . History of prostatitis   . ED (erectile dysfunction)   . Right eye trauma     hx as child  . Allergic rhinitis   . Nephrolithiasis 12/07/2010  . ALLERGIC RHINITIS 10/13/2007  . ANEMIA, CHRONIC DISEASE NEC 03/31/2007  . BENIGN PROSTATIC HYPERTROPHY 03/31/2007  . DIABETES MELLITUS, TYPE II 03/31/2007  . DISC DISEASE, LUMBAR 03/31/2007  . HYPERLIPIDEMIA 03/31/2007  . HYPERTENSION 03/31/2007  . POLYP, ANAL AND RECTAL 03/31/2007  . Chest pain     Stress echo, normal, December, 2012  . Ejection fraction     EF  normal, stress echo, December,  2012   Past Surgical History  Procedure Laterality Date  . Rectal polypectomy      Transanal excision 10/2005  . Lumbar disc surgery      1999  . Prostate biopsy      s/p  . Skin biopsy      s/p right upper back 2009- benign Dr. Danella Deis    reports that he has never smoked. He does not have any smokeless tobacco history on file. He reports that  drinks alcohol. He reports that he does not use illicit drugs. family history includes Colon cancer in his mother. Allergies  Allergen Reactions   . Metformin     REACTION: GI upset   Current Outpatient Prescriptions on File Prior to Visit  Medication Sig Dispense Refill  . amLODipine (NORVASC) 10 MG tablet Take 1 tablet (10 mg total) by mouth daily.  90 tablet  3  . aspirin 81 MG EC tablet Take 81 mg by mouth daily.        [name] atorvastatin (LIPITOR) 20 MG tablet Take 1 tablet (20 mg total) by mouth daily.  90 tablet  3  . clobetasol ointment (TEMOVATE) 0.05 % Apply topically 2 (two) times daily.  30 g  1  . fluticasone (FLONASE) 50 MCG/ACT nasal spray Place 2 sprays into the nose daily.  16 g  11  . glimepiride (AMARYL) 4 MG tablet Take 1 tablet (4 mg total) by mouth 2 (two) times daily.  180 tablet  3  . glucose blood (ACCU-CHEK COMFORT CURVE) test strip Use as directed once daily to check blood sugar.  Diagnosis code 250.00  100 each  11  . metFORMIN (GLUCOPHAGE-XR) 500 MG 24 hr tablet Take 2 by mouth every morning  180 tablet  3  . olmesartan (BENICAR) 40 MG tablet Take 0.5 tablets (20 mg total) by mouth 2 (two) times daily.  90 tablet  3  . olopatadine (PATANOL) 0.1 %  ophthalmic solution Place 1 drop into both eyes as needed.  5 mL  5  . sitaGLIPtin (JANUVIA) 100 MG tablet Take 1 tablet (100 mg total) by mouth daily.  90 tablet  3  . tadalafil (CIALIS) 20 MG tablet Take 0.5 tablets (10 mg total) by mouth every other day as needed.  10 tablet  11  . vardenafil (LEVITRA) 20 MG tablet Take 1 tablet (20 mg total) by mouth as needed for erectile dysfunction.  10 tablet  11   No current facility-administered medications on file prior to visit.   Review of Systems  Constitutional: Negative for unexpected weight change, or unusual diaphoresis  HENT: Negative for tinnitus.   Eyes: Negative for photophobia and visual disturbance.  Respiratory: Negative for choking and stridor.   Gastrointestinal: Negative for vomiting and blood in stool.  Genitourinary: Negative for hematuria and decreased urine volume.  Musculoskeletal: Negative for  acute joint swelling Skin: Negative for color change and wound.  Neurological: Negative for tremors and numbness other than noted  Psychiatric/Behavioral: Negative for decreased concentration or  hyperactivity.       Objective:   Physical Exam BP 160/72  Pulse 72  Temp(Src) 98.1 F (36.7 C) (Oral)  Ht 6' (1.829 m)  Wt 223 lb 8 oz (101.379 kg)  BMI 30.31 kg/m2  SpO2 97% VS noted, mild ill Constitutional: Pt appears well-developed and well-nourished.  HENT: Head: NCAT.  Right Ear: External ear normal.  Left Ear: External ear normal.  Bilat tm's with mild erythema.  Max sinus areas mild tender.  Pharynx with mild erythema, no exudate Eyes: Conjunctivae and EOM are normal. Pupils are equal, round, and reactive to light.  Neck: Normal range of motion. Neck supple.  Cardiovascular: Normal rate and regular rhythm.   Pulmonary/Chest: Effort normal and breath sounds normal.  Neurological: Pt is alert. Not confused  Skin: Skin is warm. No erythema.  Psychiatric: Pt behavior is normal. Thought content normal.     Assessment & Plan:

## 2013-03-02 NOTE — Patient Instructions (Signed)
Please take all new medication as prescribed - the antibiotic, and cough medicine Please continue all other medications as before, and refills have been done if requested Please continue your efforts at being more active, low cholesterol diet, and weight control as you are doing  Thank you for enrolling in MyChart. Please follow the instructions below to securely access your online medical record. MyChart allows you to send messages to your doctor, view your test results, renew your prescriptions, schedule appointments, and more.

## 2013-03-30 DIAGNOSIS — H40019 Open angle with borderline findings, low risk, unspecified eye: Secondary | ICD-10-CM | POA: Diagnosis not present

## 2013-03-30 DIAGNOSIS — Z961 Presence of intraocular lens: Secondary | ICD-10-CM | POA: Diagnosis not present

## 2013-03-30 DIAGNOSIS — H2510 Age-related nuclear cataract, unspecified eye: Secondary | ICD-10-CM | POA: Diagnosis not present

## 2013-03-30 DIAGNOSIS — H1045 Other chronic allergic conjunctivitis: Secondary | ICD-10-CM | POA: Diagnosis not present

## 2013-03-30 LAB — HM DIABETES EYE EXAM

## 2013-03-31 ENCOUNTER — Encounter: Payer: Self-pay | Admitting: Internal Medicine

## 2013-06-01 DIAGNOSIS — H40019 Open angle with borderline findings, low risk, unspecified eye: Secondary | ICD-10-CM | POA: Diagnosis not present

## 2013-06-01 DIAGNOSIS — H524 Presbyopia: Secondary | ICD-10-CM | POA: Diagnosis not present

## 2013-06-01 DIAGNOSIS — H1045 Other chronic allergic conjunctivitis: Secondary | ICD-10-CM | POA: Diagnosis not present

## 2013-06-24 ENCOUNTER — Ambulatory Visit (INDEPENDENT_AMBULATORY_CARE_PROVIDER_SITE_OTHER): Payer: Medicare Other | Admitting: Internal Medicine

## 2013-06-24 ENCOUNTER — Other Ambulatory Visit (INDEPENDENT_AMBULATORY_CARE_PROVIDER_SITE_OTHER): Payer: Medicare Other

## 2013-06-24 ENCOUNTER — Encounter: Payer: Self-pay | Admitting: Internal Medicine

## 2013-06-24 VITALS — BP 132/70 | HR 78 | Temp 98.7°F | Ht 72.0 in | Wt 230.2 lb

## 2013-06-24 DIAGNOSIS — E785 Hyperlipidemia, unspecified: Secondary | ICD-10-CM

## 2013-06-24 DIAGNOSIS — I10 Essential (primary) hypertension: Secondary | ICD-10-CM | POA: Diagnosis not present

## 2013-06-24 DIAGNOSIS — Z23 Encounter for immunization: Secondary | ICD-10-CM | POA: Diagnosis not present

## 2013-06-24 DIAGNOSIS — E119 Type 2 diabetes mellitus without complications: Secondary | ICD-10-CM | POA: Diagnosis not present

## 2013-06-24 LAB — URINALYSIS, ROUTINE W REFLEX MICROSCOPIC
Bilirubin Urine: NEGATIVE
Leukocytes, UA: NEGATIVE
Nitrite: NEGATIVE
Total Protein, Urine: NEGATIVE
Urobilinogen, UA: 0.2 (ref 0.0–1.0)
pH: 6 (ref 5.0–8.0)

## 2013-06-24 LAB — CBC WITH DIFFERENTIAL/PLATELET
Eosinophils Relative: 2.8 % (ref 0.0–5.0)
MCV: 82.2 fl (ref 78.0–100.0)
Monocytes Absolute: 0.5 10*3/uL (ref 0.1–1.0)
Neutrophils Relative %: 60.2 % (ref 43.0–77.0)
Platelets: 240 10*3/uL (ref 150.0–400.0)
RDW: 14 % (ref 11.5–14.6)
WBC: 4.7 10*3/uL (ref 4.5–10.5)

## 2013-06-24 LAB — HEMOGLOBIN A1C: Hgb A1c MFr Bld: 6.5 % (ref 4.6–6.5)

## 2013-06-24 NOTE — Assessment & Plan Note (Signed)
stable overall by history and exam, recent data reviewed with pt, and pt to continue medical treatment as before,  to f/u any worsening symptoms or concerns Lab Results  Component Value Date   HGBA1C 10.7* 12/22/2012

## 2013-06-24 NOTE — Patient Instructions (Addendum)
You had the flu shot today, and the Prevnar pneumonia shot Please continue all other medications as before, and refills have been done if requested. Please have the pharmacy call with any other refills you may need. Please continue your efforts at being more active, low cholesterol diet, and weight control. You are otherwise up to date with prevention measures today. Please keep your appointments with your specialists as you have planned - Dr Isabel Caprice for the exam and PSA Please go to the LAB in the Basement (turn left off the elevator) for the tests to be done today You will be contacted by phone if any changes need to be made immediately.  Otherwise, you will receive a letter about your results with an explanation, but please check with MyChart first.  Please remember to sign up for My Chart if you have not done so, as this will be important to you in the future with finding out test results, communicating by private email, and scheduling acute appointments online when needed.  Please return in 6 months, or sooner if needed

## 2013-06-24 NOTE — Assessment & Plan Note (Signed)
stable overall by history and exam, recent data reviewed with pt, and pt to continue medical treatment as before,  to f/u any worsening symptoms or concerns BP Readings from Last 3 Encounters:  06/24/13 132/70  03/02/13 160/72  12/22/12 152/72

## 2013-06-24 NOTE — Assessment & Plan Note (Signed)
stable overall by history and exam, recent data reviewed with pt, and pt to continue medical treatment as before,  to f/u any worsening symptoms or concerns Lab Results  Component Value Date   LDLCALC 93 12/22/2012

## 2013-06-24 NOTE — Addendum Note (Signed)
Addended by: Scharlene Gloss B on: 06/24/2013 11:42 AM   Modules accepted: Orders

## 2013-06-24 NOTE — Progress Notes (Addendum)
Subjective:    Patient ID: Brian [name], male    DOB: 1935/12/04, 77 y.o.   MRN: [id number]  HPI    Here for yearly f/u;  Overall doing ok;  Pt denies CP, worsening SOB, DOE, wheezing, orthopnea, PND, worsening LE edema, palpitations, dizziness or syncope.  Pt denies neurological change such as new headache, facial or extremity weakness.  Pt denies polydipsia, polyuria, or low sugar symptoms. Pt states overall good compliance with treatment and medications, good tolerability, and has been trying to follow lower cholesterol diet.  Pt denies worsening depressive symptoms, suicidal ideation or panic. No fever, night sweats, wt loss, loss of appetite, or other constitutional symptoms.  Pt states good ability with ADL's, has low fall risk, home safety reviewed and adequate, no other significant changes in hearing or vision, and only occasionally active with exercise. Trying to follow much more strict DM diet, and good complaince with meds.  Checking cbg's 2-3 times per wk, low of 90, high 131.  Walking for exercise about 30 min per day. Sees urology for PSA an exam, has f/u in 2 mo. Brother just died with prostate cancer Past Medical History  Diagnosis Date  . Diabetes mellitus type II   . Hyperlipidemia   . Hypertension   . BPH (benign prostatic hypertrophy)   . Obesity   . Chronic anemia   . History of prostatitis   . ED (erectile dysfunction)   . Right eye trauma     hx as child  . Allergic rhinitis   . Nephrolithiasis 12/07/2010  . ALLERGIC RHINITIS 10/13/2007  . ANEMIA, CHRONIC DISEASE NEC 03/31/2007  . BENIGN PROSTATIC HYPERTROPHY 03/31/2007  . DIABETES MELLITUS, TYPE II 03/31/2007  . DISC DISEASE, LUMBAR 03/31/2007  . HYPERLIPIDEMIA 03/31/2007  . HYPERTENSION 03/31/2007  . POLYP, ANAL AND RECTAL 03/31/2007  . Chest pain     Stress echo, normal, December, 2012  . Ejection fraction     EF  normal, stress echo, December,  2012   Past Surgical History  Procedure Laterality Date  . Rectal  polypectomy      Transanal excision 10/2005  . Lumbar disc surgery      1999  . Prostate biopsy      s/p  . Skin biopsy      s/p right upper back 2009- benign Dr. Danella Deis    reports that he has never smoked. He does not have any smokeless tobacco history on file. He reports that he drinks alcohol. He reports that he does not use illicit drugs. family history includes Colon cancer in his mother. Allergies  Allergen Reactions  . Metformin     REACTION: GI upset   Current Outpatient Prescriptions on File Prior to Visit  Medication Sig Dispense Refill  . amLODipine (NORVASC) 10 MG tablet Take 1 tablet (10 mg total) by mouth daily.  90 tablet  3  . aspirin 81 MG EC tablet Take 81 mg by mouth daily.        [name] atorvastatin (LIPITOR) 20 MG tablet Take 1 tablet (20 mg total) by mouth daily.  90 tablet  3  . clobetasol ointment (TEMOVATE) 0.05 % Apply topically 2 (two) times daily.  30 g  1  . fluticasone (FLONASE) 50 MCG/ACT nasal spray Place 2 sprays into the nose daily.  16 g  11  . glimepiride (AMARYL) 4 MG tablet Take 1 tablet (4 mg total) by mouth 2 (two) times daily.  180 tablet  3  . glucose blood (  ACCU-CHEK COMFORT CURVE) test strip Use as directed once daily to check blood sugar.  Diagnosis code 250.00  100 each  11  . metFORMIN (GLUCOPHAGE-XR) 500 MG 24 hr tablet Take 2 by mouth every morning  180 tablet  3  . olmesartan (BENICAR) 40 MG tablet Take 0.5 tablets (20 mg total) by mouth 2 (two) times daily.  90 tablet  3  . olopatadine (PATANOL) 0.1 % ophthalmic solution Place 1 drop into both eyes as needed.  5 mL  5  . sitaGLIPtin (JANUVIA) 100 MG tablet Take 1 tablet (100 mg total) by mouth daily.  90 tablet  3  . tadalafil (CIALIS) 20 MG tablet Take 0.5 tablets (10 mg total) by mouth every other day as needed.  10 tablet  11  . vardenafil (LEVITRA) 20 MG tablet Take 1 tablet (20 mg total) by mouth as needed for erectile dysfunction.  10 tablet  11   No current facility-administered  medications on file prior to visit.   Review of Systems Constitutional: Negative for diaphoresis, activity change, appetite change or unexpected weight change.  HENT: Negative for hearing loss, ear pain, facial swelling, mouth sores and neck stiffness.   Eyes: Negative for pain, redness and visual disturbance.  Respiratory: Negative for shortness of breath and wheezing.   Cardiovascular: Negative for chest pain and palpitations.  Gastrointestinal: Negative for diarrhea, blood in stool, abdominal distention or other pain Genitourinary: Negative for hematuria, flank pain or change in urine volume.  Musculoskeletal: Negative for myalgias and joint swelling.  Skin: Negative for color change and wound.  Neurological: Negative for syncope and numbness. other than noted Hematological: Negative for adenopathy.  Psychiatric/Behavioral: Negative for hallucinations, self-injury, decreased concentration and agitation.      Objective:   Physical Exam BP 132/70  Pulse 78  Temp(Src) 98.7 F (37.1 C) (Oral)  Ht 6' (1.829 m)  Wt 230 lb 4 oz (104.441 kg)  BMI 31.22 kg/m2  SpO2 96% VS noted,  Constitutional: Pt is oriented to person, place, and time. Appears well-developed and well-nourished.  Head: Normocephalic and atraumatic.  Right Ear: External ear normal.  Left Ear: External ear normal.  Nose: Nose normal.  Mouth/Throat: Oropharynx is clear and moist.  Eyes: Conjunctivae and EOM are normal. Pupils are equal, round, and reactive to light.  Neck: Normal range of motion. Neck supple. No JVD present. No tracheal deviation present.  Cardiovascular: Normal rate, regular rhythm, normal heart sounds and intact distal pulses.   Pulmonary/Chest: Effort normal and breath sounds normal.  Abdominal: Soft. Bowel sounds are normal. There is no tenderness. No HSM  Musculoskeletal: Normal range of motion. Exhibits no edema.  Lymphadenopathy:  Has no cervical adenopathy.  Neurological: Pt is alert and  oriented to person, place, and time. Pt has normal reflexes. No cranial nerve deficit.  Skin: Skin is warm and dry. No rash noted.  Psychiatric:  Has  normal mood and affect. Behavior is normal.     Assessment & Plan:  Quality Measures addressed:  Diabetes Blood Pressure < 140/90: pt declines further medication change at this time  Diabetes Hgba1c < 8%: pt declines further medication

## 2013-06-25 LAB — BASIC METABOLIC PANEL
BUN: 15 mg/dL (ref 6–23)
CO2: 28 mEq/L (ref 19–32)
Chloride: 104 mEq/L (ref 96–112)
GFR: 90.41 mL/min (ref >60.00)
Glucose, Bld: 172 mg/dL — ABNORMAL HIGH (ref 70–99)
Potassium: 5.2 mEq/L — ABNORMAL HIGH (ref 3.5–5.1)

## 2013-06-25 LAB — HEPATIC FUNCTION PANEL
ALT: 22 U/L (ref 0–53)
AST: 24 U/L (ref 0–37)
Albumin: 4.6 g/dL (ref 3.5–5.2)
Alkaline Phosphatase: 62 U/L (ref 39–117)
Bilirubin, Direct: 0.1 mg/dL (ref 0.0–0.3)
Total Bilirubin: 0.7 mg/dL (ref 0.3–1.2)

## 2013-06-25 LAB — TSH: TSH: 1.08 u[IU]/mL (ref 0.35–5.50)

## 2013-06-25 LAB — LIPID PANEL
Cholesterol: 145 mg/dL (ref 0–200)
Total CHOL/HDL Ratio: 3
VLDL: 15.8 mg/dL (ref 0.0–40.0)

## 2013-07-23 ENCOUNTER — Other Ambulatory Visit: Payer: Self-pay

## 2013-07-30 DIAGNOSIS — N401 Enlarged prostate with lower urinary tract symptoms: Secondary | ICD-10-CM | POA: Diagnosis not present

## 2013-07-30 DIAGNOSIS — N138 Other obstructive and reflux uropathy: Secondary | ICD-10-CM | POA: Diagnosis not present

## 2013-07-30 DIAGNOSIS — N139 Obstructive and reflux uropathy, unspecified: Secondary | ICD-10-CM | POA: Diagnosis not present

## 2013-07-30 DIAGNOSIS — N529 Male erectile dysfunction, unspecified: Secondary | ICD-10-CM | POA: Diagnosis not present

## 2013-08-28 DIAGNOSIS — H4030X0 Glaucoma secondary to eye trauma, unspecified eye, stage unspecified: Secondary | ICD-10-CM | POA: Diagnosis not present

## 2013-08-28 DIAGNOSIS — H40019 Open angle with borderline findings, low risk, unspecified eye: Secondary | ICD-10-CM | POA: Diagnosis not present

## 2013-08-28 DIAGNOSIS — H11159 Pinguecula, unspecified eye: Secondary | ICD-10-CM | POA: Diagnosis not present

## 2013-08-28 DIAGNOSIS — H524 Presbyopia: Secondary | ICD-10-CM | POA: Diagnosis not present

## 2013-08-31 ENCOUNTER — Ambulatory Visit (INDEPENDENT_AMBULATORY_CARE_PROVIDER_SITE_OTHER): Payer: Medicare Other

## 2013-08-31 ENCOUNTER — Ambulatory Visit (INDEPENDENT_AMBULATORY_CARE_PROVIDER_SITE_OTHER): Payer: Medicare Other | Admitting: Podiatry

## 2013-08-31 ENCOUNTER — Encounter: Payer: Self-pay | Admitting: Podiatry

## 2013-08-31 VITALS — Ht 72.0 in | Wt 220.0 lb

## 2013-08-31 DIAGNOSIS — H5711 Ocular pain, right eye: Secondary | ICD-10-CM

## 2013-08-31 DIAGNOSIS — M79609 Pain in unspecified limb: Secondary | ICD-10-CM | POA: Diagnosis not present

## 2013-08-31 DIAGNOSIS — M722 Plantar fascial fibromatosis: Secondary | ICD-10-CM | POA: Diagnosis not present

## 2013-08-31 DIAGNOSIS — H5710 Ocular pain, unspecified eye: Secondary | ICD-10-CM

## 2013-08-31 MED ORDER — TRIAMCINOLONE ACETONIDE 10 MG/ML IJ SUSP
10.0000 mg | Freq: Once | INTRAMUSCULAR | Status: AC
  Administered 2013-08-31: 10 mg

## 2013-08-31 NOTE — Patient Instructions (Signed)
Plantar Fasciitis (Heel Spur Syndrome)  with Rehab  The plantar fascia is a fibrous, ligament-like, soft-tissue structure that spans the bottom of the foot. Plantar fasciitis is a condition that causes pain in the foot due to inflammation of the tissue.  SYMPTOMS   · Pain and tenderness on the underneath side of the foot.  · Pain that worsens with standing or walking.  CAUSES   Plantar fasciitis is caused by irritation and injury to the plantar fascia on the underneath side of the foot. Common mechanisms of injury include:  · Direct trauma to bottom of the foot.  · Damage to a small nerve that runs under the foot where the main fascia attaches to the heel bone.  · Stress placed on the plantar fascia due to bone spurs.  RISK INCREASES WITH:   · Activities that place stress on the plantar fascia (running, jumping, pivoting, or cutting).  · Poor strength and flexibility.  · Improperly fitted shoes.  · Tight calf muscles.  · Flat feet.  · Failure to warm-up properly before activity.  · Obesity.  PREVENTION  · Warm up and stretch properly before activity.  · Allow for adequate recovery between workouts.  · Maintain physical fitness:  · Strength, flexibility, and endurance.  · Cardiovascular fitness.  · Maintain a health body weight.  · Avoid stress on the plantar fascia.  · Wear properly fitted shoes, including arch supports for individuals who have flat feet.  PROGNOSIS   If treated properly, then the symptoms of plantar fasciitis usually resolve without surgery. However, occasionally surgery is necessary.  RELATED COMPLICATIONS   · Recurrent symptoms that may result in a chronic condition.  · Problems of the lower back that are caused by compensating for the injury, such as limping.  · Pain or weakness of the foot during push-off following surgery.  · Chronic inflammation, scarring, and partial or complete fascia tear, occurring more often from repeated injections.  TREATMENT   Treatment initially involves the use of  ice and medication to help reduce pain and inflammation. The use of strengthening and stretching exercises may help reduce pain with activity, especially stretches of the Achilles tendon. These exercises may be performed at home or with a therapist. Your caregiver may recommend that you use heel cups of arch supports to help reduce stress on the plantar fascia. Occasionally, corticosteroid injections are given to reduce inflammation. If symptoms persist for greater than 6 months despite non-surgical (conservative), then surgery may be recommended.   MEDICATION   · If pain medication is necessary, then nonsteroidal anti-inflammatory medications, such as aspirin and ibuprofen, or other minor pain relievers, such as acetaminophen, are often recommended.  · Do not take pain medication within 7 days before surgery.  · Prescription pain relievers may be given if deemed necessary by your caregiver. Use only as directed and only as much as you need.  · Corticosteroid injections may be given by your caregiver. These injections should be reserved for the most serious cases, because they may only be given a certain number of times.  HEAT AND COLD  · Cold treatment (icing) relieves pain and reduces inflammation. Cold treatment should be applied for 10 to 15 minutes every 2 to 3 hours for inflammation and pain and immediately after any activity that aggravates your symptoms. Use ice packs or massage the area with a piece of ice (ice massage).  · Heat treatment may be used prior to performing the stretching and strengthening activities prescribed   by your caregiver, physical therapist, or athletic trainer. Use a heat pack or soak the injury in warm water.  SEEK IMMEDIATE MEDICAL CARE IF:  · Treatment seems to offer no benefit, or the condition worsens.  · Any medications produce adverse side effects.  EXERCISES  RANGE OF MOTION (ROM) AND STRETCHING EXERCISES - Plantar Fasciitis (Heel Spur Syndrome)  These exercises may help you  when beginning to rehabilitate your injury. Your symptoms may resolve with or without further involvement from your physician, physical therapist or athletic trainer. While completing these exercises, remember:   · Restoring tissue flexibility helps normal motion to return to the joints. This allows healthier, less painful movement and activity.  · An effective stretch should be held for at least 30 seconds.  · A stretch should never be painful. You should only feel a gentle lengthening or release in the stretched tissue.  RANGE OF MOTION - Toe Extension, Flexion  · Sit with your right / left leg crossed over your opposite knee.  · Grasp your toes and gently pull them back toward the top of your foot. You should feel a stretch on the bottom of your toes and/or foot.  · Hold this stretch for __________ seconds.  · Now, gently pull your toes toward the bottom of your foot. You should feel a stretch on the top of your toes and or foot.  · Hold this stretch for __________ seconds.  Repeat __________ times. Complete this stretch __________ times per day.   RANGE OF MOTION - Ankle Dorsiflexion, Active Assisted  · Remove shoes and sit on a chair that is preferably not on a carpeted surface.  · Place right / left foot under knee. Extend your opposite leg for support.  · Keeping your heel down, slide your right / left foot back toward the chair until you feel a stretch at your ankle or calf. If you do not feel a stretch, slide your bottom forward to the edge of the chair, while still keeping your heel down.  · Hold this stretch for __________ seconds.  Repeat __________ times. Complete this stretch __________ times per day.   STRETCH  Gastroc, Standing  · Place hands on wall.  · Extend right / left leg, keeping the front knee somewhat bent.  · Slightly point your toes inward on your back foot.  · Keeping your right / left heel on the floor and your knee straight, shift your weight toward the wall, not allowing your back to  arch.  · You should feel a gentle stretch in the right / left calf. Hold this position for __________ seconds.  Repeat __________ times. Complete this stretch __________ times per day.  STRETCH  Soleus, Standing  · Place hands on wall.  · Extend right / left leg, keeping the other knee somewhat bent.  · Slightly point your toes inward on your back foot.  · Keep your right / left heel on the floor, bend your back knee, and slightly shift your weight over the back leg so that you feel a gentle stretch deep in your back calf.  · Hold this position for __________ seconds.  Repeat __________ times. Complete this stretch __________ times per day.  STRETCH  Gastrocsoleus, Standing   Note: This exercise can place a lot of stress on your foot and ankle. Please complete this exercise only if specifically instructed by your caregiver.   · Place the ball of your right / left foot on a step, keeping   your other foot firmly on the same step.  · Hold on to the wall or a rail for balance.  · Slowly lift your other foot, allowing your body weight to press your heel down over the edge of the step.  · You should feel a stretch in your right / left calf.  · Hold this position for __________ seconds.  · Repeat this exercise with a slight bend in your right / left knee.  Repeat __________ times. Complete this stretch __________ times per day.   STRENGTHENING EXERCISES - Plantar Fasciitis (Heel Spur Syndrome)   These exercises may help you when beginning to rehabilitate your injury. They may resolve your symptoms with or without further involvement from your physician, physical therapist or athletic trainer. While completing these exercises, remember:   · Muscles can gain both the endurance and the strength needed for everyday activities through controlled exercises.  · Complete these exercises as instructed by your physician, physical therapist or athletic trainer. Progress the resistance and repetitions only as guided.  STRENGTH - Towel  Curls  · Sit in a chair positioned on a non-carpeted surface.  · Place your foot on a towel, keeping your heel on the floor.  · Pull the towel toward your heel by only curling your toes. Keep your heel on the floor.  · If instructed by your physician, physical therapist or athletic trainer, add ____________________ at the end of the towel.  Repeat __________ times. Complete this exercise __________ times per day.  STRENGTH - Ankle Inversion  · Secure one end of a rubber exercise band/tubing to a fixed object (table, pole). Loop the other end around your foot just before your toes.  · Place your fists between your knees. This will focus your strengthening at your ankle.  · Slowly, pull your big toe up and in, making sure the band/tubing is positioned to resist the entire motion.  · Hold this position for __________ seconds.  · Have your muscles resist the band/tubing as it slowly pulls your foot back to the starting position.  Repeat __________ times. Complete this exercises __________ times per day.   Document Released: 09/03/2005 Document Revised: 11/26/2011 Document Reviewed: 12/16/2008  [employer]® Patient Information ©2014 [employer].

## 2013-08-31 NOTE — Progress Notes (Signed)
   Subjective:    Patient ID: Brian [name], male    DOB: 08-Aug-1936, 77 y.o.   MRN: [id number]  HPI Comments: ''lt foot bottom of the heel is been hurting at least a week ago. Tried to treat with advil medication and helps temporary.''  Patient has reduced his walking program for several weeks which has reduced some of the pain in the left heel.    Review of Systems  All other systems reviewed and are negative.       Objective:   Physical Exam  77 year old black male appears orientated x3.  Vascular: The DP and PT pulses are two over four bilaterally. Capillary fill is immediate bilaterally.  Neurological: Sensation detained in monofilament wire intact 9/10 locations bilaterally. Knee and ankle reflexes equal and reactive bilaterally. Vibratory sensation intact bilaterally.  Dermatological: Hypertrophic, incurvated, discolored toenails 1, 2, 3, 4, 5, right and 1 left.  Musculoskeletal: Palpable tenderness central inferior left heel which duplicates her area of discomfort. The fat-pad is adequate. X-Dedrick report see attached report demonstrates well-organized inferior heel spur left.      Assessment & Plan:   Assessment: Plantar fasciitis left Satisfactory neurovascular status bilaterally  Plan: The left heel is prepped with alcohol and Betadine and 10 mg of Kenalog mixed with 10 mg of plain Xylocaine and 2.5 mg of plain Marcaine are injected inferior heel left for Kenalog injection #1. He is advised that the Kenalog injection will increase his blood glucose temporarily. A fasciitis strap was dispensed to wear in the left foot. Shoeing and stretching discussed. Patient return if the symptoms are not improving in the next 30 days.

## 2013-10-08 DIAGNOSIS — H1130 Conjunctival hemorrhage, unspecified eye: Secondary | ICD-10-CM | POA: Diagnosis not present

## 2013-10-16 ENCOUNTER — Encounter: Payer: Self-pay | Admitting: Internal Medicine

## 2013-11-09 ENCOUNTER — Encounter: Payer: Self-pay | Admitting: Internal Medicine

## 2013-12-24 ENCOUNTER — Encounter: Payer: Self-pay | Admitting: Internal Medicine

## 2013-12-24 ENCOUNTER — Other Ambulatory Visit: Payer: Self-pay | Admitting: Internal Medicine

## 2013-12-24 ENCOUNTER — Ambulatory Visit (AMBULATORY_SURGERY_CENTER): Payer: Self-pay

## 2013-12-24 ENCOUNTER — Ambulatory Visit (INDEPENDENT_AMBULATORY_CARE_PROVIDER_SITE_OTHER): Payer: Medicare Other | Admitting: Internal Medicine

## 2013-12-24 ENCOUNTER — Other Ambulatory Visit (INDEPENDENT_AMBULATORY_CARE_PROVIDER_SITE_OTHER): Payer: Medicare Other

## 2013-12-24 VITALS — Ht 72.0 in | Wt 232.0 lb

## 2013-12-24 VITALS — BP 150/80 | HR 67 | Temp 97.9°F | Ht 72.0 in | Wt 231.2 lb

## 2013-12-24 DIAGNOSIS — E119 Type 2 diabetes mellitus without complications: Secondary | ICD-10-CM

## 2013-12-24 DIAGNOSIS — Z860100 Personal history of colon polyps, unspecified: Secondary | ICD-10-CM

## 2013-12-24 DIAGNOSIS — I10 Essential (primary) hypertension: Secondary | ICD-10-CM | POA: Diagnosis not present

## 2013-12-24 DIAGNOSIS — E785 Hyperlipidemia, unspecified: Secondary | ICD-10-CM

## 2013-12-24 LAB — BASIC METABOLIC PANEL
BUN: 14 mg/dL (ref 6–23)
CALCIUM: 9.9 mg/dL (ref 8.4–10.5)
CO2: 31 meq/L (ref 19–32)
Chloride: 101 mEq/L (ref 96–112)
Creatinine, Ser: 0.9 mg/dL (ref 0.4–1.5)
GFR: 101.58 mL/min (ref >60.00)
Glucose, Bld: 135 mg/dL — ABNORMAL HIGH (ref 70–99)
Potassium: 4.4 mEq/L (ref 3.5–5.1)
Sodium: 138 mEq/L (ref 135–145)

## 2013-12-24 LAB — HEPATIC FUNCTION PANEL
ALBUMIN: 4.6 g/dL (ref 3.5–5.2)
ALT: 31 U/L (ref 0–53)
AST: 28 U/L (ref 0–37)
Alkaline Phosphatase: 70 U/L (ref 39–117)
Bilirubin, Direct: 0.1 mg/dL (ref 0.0–0.3)
TOTAL PROTEIN: 8 g/dL (ref 6.0–8.3)
Total Bilirubin: 0.7 mg/dL (ref 0.3–1.2)

## 2013-12-24 LAB — LIPID PANEL
CHOL/HDL RATIO: 3
Cholesterol: 158 mg/dL (ref 0–200)
HDL: 50.9 mg/dL (ref >39.00)
LDL Cholesterol: 90 mg/dL (ref 0–99)
Triglycerides: 86 mg/dL (ref 0.0–149.0)
VLDL: 17.2 mg/dL (ref 0.0–40.0)

## 2013-12-24 LAB — HEMOGLOBIN A1C: HEMOGLOBIN A1C: 7.6 % — AB (ref 4.6–6.5)

## 2013-12-24 MED ORDER — METFORMIN HCL ER 500 MG PO TB24: ORAL_TABLET | ORAL | Status: DC

## 2013-12-24 MED ORDER — AMLODIPINE BESYLATE 10 MG PO TABS: 10.0000 mg | ORAL_TABLET | Freq: Every day | ORAL | Status: DC

## 2013-12-24 MED ORDER — MOVIPREP 100 G PO SOLR: 1.0000 | Freq: Once | ORAL | Status: DC

## 2013-12-24 MED ORDER — GLIMEPIRIDE 4 MG PO TABS: 4.0000 mg | ORAL_TABLET | Freq: Two times a day (BID) | ORAL | Status: DC

## 2013-12-24 MED ORDER — OLMESARTAN MEDOXOMIL 40 MG PO TABS: 20.0000 mg | ORAL_TABLET | Freq: Two times a day (BID) | ORAL | Status: DC

## 2013-12-24 MED ORDER — CLOBETASOL PROPIONATE 0.05 % EX CREA: 1.0000 "application " | TOPICAL_CREAM | Freq: Two times a day (BID) | CUTANEOUS | Status: DC

## 2013-12-24 MED ORDER — TADALAFIL 20 MG PO TABS: 10.0000 mg | ORAL_TABLET | ORAL | Status: DC | PRN

## 2013-12-24 MED ORDER — SITAGLIPTIN PHOSPHATE 100 MG PO TABS: 100.0000 mg | ORAL_TABLET | Freq: Every day | ORAL | Status: DC

## 2013-12-24 MED ORDER — ATORVASTATIN CALCIUM 20 MG PO TABS: 20.0000 mg | ORAL_TABLET | Freq: Every day | ORAL | Status: DC

## 2013-12-24 NOTE — Patient Instructions (Addendum)
Please continue all other medications as before, and refills have been done if requested. Please have the pharmacy call with any other refills you may need.  Please continue your efforts at being more active, low cholesterol diet, and weight control.  You are otherwise up to date with prevention measures today.  Please go to the LAB in the Basement (turn left off the elevator) for the tests to be done today  You will be contacted by phone if any changes need to be made immediately.  Otherwise, you will receive a letter about your results with an explanation, but please check with MyChart first.  Please remember to sign up for MyChart if you have not done so, as this will be important to you in the future with finding out test results, communicating by private email, and scheduling acute appointments online when needed.  Please return in 6 months, or sooner if needed 

## 2013-12-24 NOTE — Assessment & Plan Note (Signed)
stable overall by history and exam, recent data reviewed with pt, and pt to continue medical treatment as before,  to f/u any worsening symptoms or concerns Lab Results  Component Value Date   LDLCALC 79 06/24/2013

## 2013-12-24 NOTE — Assessment & Plan Note (Signed)
stable overall by history and exam, recent data reviewed with pt, and pt to continue medical treatment as before,  to f/u any worsening symptoms or concerns For f/u labs today Lab Results  Component Value Date   HGBA1C 6.5 06/24/2013

## 2013-12-24 NOTE — Assessment & Plan Note (Signed)
Ok to re-start the amldoipine 10 qd,  to f/u any worsening symptoms or concerns BP Readings from Last 3 Encounters:  12/24/13 150/80  06/24/13 132/70  03/02/13 160/72

## 2013-12-24 NOTE — Progress Notes (Signed)
Pre visit review using our clinic review tool, if applicable. No additional management support is needed unless otherwise documented below in the visit note. 

## 2013-12-24 NOTE — Progress Notes (Signed)
Subjective:    Patient ID: Brian [name], male    DOB: Mar 20, 1936, 78 y.o.   MRN: [id number]  HPI  Here to f/u; overall doing ok,  Pt denies chest pain, increased sob or doe, wheezing, orthopnea, PND, increased LE swelling, palpitations, dizziness or syncope.  Pt denies polydipsia, polyuria, or low sugar symptoms such as weakness or confusion improved with po intake.  Pt denies new neurological symptoms such as new headache, or facial or extremity weakness or numbness.   Pt states overall good compliance with meds, has been trying to follow lower cholesterol, diabetic diet, with wt overall stable,  but little exercise however in the winter, plans to do more this sumrmer. No more candy since last a1c so high.  Left heel pain improved.  Somehow got away from taking the amlod 10, needs refill, BP elev today Past Medical History  Diagnosis Date  . Diabetes mellitus type II   . Hyperlipidemia   . Hypertension   . BPH (benign prostatic hypertrophy)   . Obesity   . Chronic anemia   . History of prostatitis   . ED (erectile dysfunction)   . Right eye trauma     hx as child  . Allergic rhinitis   . Nephrolithiasis 12/07/2010  . ALLERGIC RHINITIS 10/13/2007  . ANEMIA, CHRONIC DISEASE NEC 03/31/2007  . BENIGN PROSTATIC HYPERTROPHY 03/31/2007  . DIABETES MELLITUS, TYPE II 03/31/2007  . [place] DISEASE, LUMBAR 03/31/2007  . HYPERLIPIDEMIA 03/31/2007  . HYPERTENSION 03/31/2007  . POLYP, ANAL AND RECTAL 03/31/2007  . Chest pain     Stress echo, normal, December, 2012  . Ejection fraction     EF  normal, stress echo, December,  2012   Past Surgical History  Procedure Laterality Date  . Rectal polypectomy      Transanal excision 10/2005  . Lumbar disc surgery      1999  . Prostate biopsy      s/p  . Skin biopsy      s/p right upper back 2009- benign Dr. Tonia Brooms    reports that he has never smoked. He does not have any smokeless tobacco history on file. He reports that he drinks alcohol. He reports that  he does not use illicit drugs. family history includes Colon polyps in his mother. Allergies  Allergen Reactions  . Metformin     REACTION: GI upset   Current Outpatient Prescriptions on File Prior to Visit  Medication Sig Dispense Refill  . aspirin 81 MG EC tablet Take 81 mg by mouth daily.        . fluticasone (FLONASE) 50 MCG/ACT nasal spray Place 2 sprays into the nose daily.  16 g  11  . glucose blood (ACCU-CHEK COMFORT CURVE) test strip Use as directed once daily to check blood sugar.  Diagnosis code 250.00  100 each  11  . tadalafil (CIALIS) 20 MG tablet Take 0.5 tablets (10 mg total) by mouth every other day as needed.  10 tablet  11   No current facility-administered medications on file prior to visit.   Review of Systems  Constitutional: Negative for unexpected weight change, or unusual diaphoresis  HENT: Negative for tinnitus.   Eyes: Negative for photophobia and visual disturbance.  Respiratory: Negative for choking and stridor.   Gastrointestinal: Negative for vomiting and blood in stool.  Genitourinary: Negative for hematuria and decreased urine volume.  Musculoskeletal: Negative for acute joint swelling Skin: Negative for color change and wound.  Neurological: Negative for tremors  and numbness other than noted  Psychiatric/Behavioral: Negative for decreased concentration or  hyperactivity.       Objective:   Physical Exam BP 150/80  Pulse 67  Temp(Src) 97.9 F (36.6 C) (Oral)  Ht 6' (1.829 m)  Wt 231 lb 4 oz (104.894 kg)  BMI 31.36 kg/m2  SpO2 97% VS noted,  Constitutional: Pt appears well-developed and well-nourished.  HENT: Head: NCAT.  Right Ear: External ear normal.  Left Ear: External ear normal.  Eyes: Conjunctivae and EOM are normal. Pupils are equal, round, and reactive to light.  Neck: Normal range of motion. Neck supple.  Cardiovascular: Normal rate and regular rhythm.   Pulmonary/Chest: Effort normal and breath sounds normal.  Neurological: Pt  is alert. Not confused  Skin: Skin is warm. No erythema.  Psychiatric: Pt behavior is normal. Thought content normal.      Assessment & Plan:

## 2014-01-13 ENCOUNTER — Encounter: Payer: Medicare Other | Admitting: Internal Medicine

## 2014-01-22 ENCOUNTER — Encounter: Payer: Self-pay | Admitting: Internal Medicine

## 2014-01-22 ENCOUNTER — Ambulatory Visit (AMBULATORY_SURGERY_CENTER): Payer: Medicare Other | Admitting: Internal Medicine

## 2014-01-22 VITALS — BP 125/47 | HR 55 | Temp 97.1°F | Resp 30

## 2014-01-22 DIAGNOSIS — E669 Obesity, unspecified: Secondary | ICD-10-CM | POA: Diagnosis not present

## 2014-01-22 DIAGNOSIS — Z860100 Personal history of colon polyps, unspecified: Secondary | ICD-10-CM

## 2014-01-22 DIAGNOSIS — Z1211 Encounter for screening for malignant neoplasm of colon: Secondary | ICD-10-CM | POA: Diagnosis not present

## 2014-01-22 DIAGNOSIS — E119 Type 2 diabetes mellitus without complications: Secondary | ICD-10-CM | POA: Diagnosis not present

## 2014-01-22 DIAGNOSIS — D649 Anemia, unspecified: Secondary | ICD-10-CM | POA: Diagnosis not present

## 2014-01-22 DIAGNOSIS — I10 Essential (primary) hypertension: Secondary | ICD-10-CM | POA: Diagnosis not present

## 2014-01-22 DIAGNOSIS — D126 Benign neoplasm of colon, unspecified: Secondary | ICD-10-CM | POA: Diagnosis not present

## 2014-01-22 LAB — GLUCOSE, CAPILLARY
Glucose-Capillary: 184 mg/dL — ABNORMAL HIGH (ref 70–99)
Glucose-Capillary: 182 mg/dL — ABNORMAL HIGH (ref 70–99)

## 2014-01-22 MED ORDER — SODIUM CHLORIDE 0.9 % IV SOLN: 500.0000 mL | INTRAVENOUS | Status: DC

## 2014-01-22 NOTE — Progress Notes (Signed)
Called to room to assist during endoscopic procedure.  Patient ID and intended procedure confirmed with present staff. Received instructions for my participation in the procedure from the performing physician.  

## 2014-01-22 NOTE — Op Note (Signed)
[place]  [address] [place], [address]   COLONOSCOPY PROCEDURE REPORT  PATIENT: [name], Brian  MR#: [id number] BIRTHDATE: 1936-02-14 , 78  yrs. old GENDER: Male ENDOSCOPIST: Lafayette Dragon, MD REFERRED HR:CBULA John, M.D. PROCEDURE DATE:  01/22/2014 PROCEDURE:   Colonoscopy with snare polypectomy and Colonoscopy with cold biopsy polypectomy First Screening Colonoscopy - Avg.  risk and is 50 yrs.  old or older - No.  Prior Negative Screening - Now for repeat screening. N/A  History of Adenoma - Now for follow-up colonoscopy & has been > or = to 3 yrs.  Yes hx of adenoma.  Has been 3 or more years since last colonoscopy.  Polyps Removed Today? Yes. ASA CLASS:   Class II INDICATIONS:her colonoscopy in 1997 and 1999, 2003.  Cough but did villous adenoma of the rectum surgically excised in 2007.  Last colonoscopy March 2010. MEDICATIONS: MAC sedation, administered by CRNA and Propofol (Diprivan) 260 mg IV  DESCRIPTION OF PROCEDURE:   After the risks benefits and alternatives of the procedure were thoroughly explained, informed consent was obtained.  A digital rectal exam revealed no abnormalities of the rectum.   The LB [id number]  endoscope was introduced through the anus and advanced to the cecum, which was identified by both the appendix and ileocecal valve. No adverse events experienced.   The quality of the prep was good, using MoviPrep  The instrument was then slowly withdrawn as the colon was fully examined.      COLON FINDINGS: Three polypoid shaped sessile polyps ranging between 5-70mm in size were found in the ascending colon and descending colon.  A polypectomy was performed with cold forceps and with a cold snare.  The resection was complete and the polyp tissue was completely retrieved.   Mild diverticulosis was noted in the sigmoid colon.  Retroflexed views revealed no abnormalities. The time to cecum=6 minutes 15 seconds.   Withdrawal time=7 minutes 520 seconds.  The scope was withdrawn and the procedure completed. COMPLICATIONS: There were no complications.  ENDOSCOPIC IMPRESSION: 1.   Three sessile polyps ranging between 5-52mm in size were found in the ascending colon x2 and descending colonx1, polypectomy was performed with cold forceps and with a cold snare ( ascending colon polyps) 2.   Mild diverticulosis was noted in the sigmoid colon  RECOMMENDATIONS: 1.  Await pathology results 2.  high fiber diet No recall due to age No aspirin or anti-inflammatory agents for 2 weeks   eSigned:  Lafayette Dragon, MD 01/22/2014 12:07 PM   cc:   PATIENT NAME:  Brian [name], Brian [name] MR#: [id number]

## 2014-01-22 NOTE — Progress Notes (Signed)
Procedure ends, to recovery, report given and VSS. 

## 2014-01-22 NOTE — Patient Instructions (Addendum)
YOU HAD AN ENDOSCOPIC PROCEDURE TODAY AT THE [facility]: Refer to the procedure report that was given to you for any specific questions about what was found during the examination.  If the procedure report does not answer your questions, please call your gastroenterologist to clarify.  If you requested that your care partner not be given the details of your procedure findings, then the procedure report has been included in a sealed envelope for you to review at your convenience later.  YOU SHOULD EXPECT: Some feelings of bloating in the abdomen. Passage of more gas than usual.  Walking can help get rid of the air that was put into your GI tract during the procedure and reduce the bloating. If you had a lower endoscopy (such as a colonoscopy or flexible sigmoidoscopy) you may notice spotting of blood in your stool or on the toilet paper. If you underwent a bowel prep for your procedure, then you may not have a normal bowel movement for a few days.  DIET: Your first meal following the procedure should be a light meal and then it is ok to progress to your normal diet.  A half-sandwich or bowl of soup is an example of a good first meal.  Heavy or fried foods are harder to digest and may make you feel nauseous or bloated.  Likewise meals heavy in dairy and vegetables can cause extra gas to form and this can also increase the bloating.  Drink plenty of fluids but you should avoid alcoholic beverages for 24 hours.  ACTIVITY: Your care partner should take you home directly after the procedure.  You should plan to take it easy, moving slowly for the rest of the day.  You can resume normal activity the day after the procedure however you should NOT DRIVE or use heavy machinery for 24 hours (because of the sedation medicines used during the test).    SYMPTOMS TO REPORT IMMEDIATELY: A gastroenterologist can be reached at any hour.  During normal business hours, 8:30 AM to 5:00 PM Monday through Friday,  call [phone].  After hours and on weekends, please call the GI answering service at [phone] who will take a message and have the physician on call contact you.   Following lower endoscopy (colonoscopy or flexible sigmoidoscopy):  Excessive amounts of blood in the stool  Significant tenderness or worsening of abdominal pains  Swelling of the abdomen that is new, acute  Fever of 100F or higher    FOLLOW UP: If any biopsies were taken you will be contacted by phone or by letter within the next 1-3 weeks.  Call your gastroenterologist if you have not heard about the biopsies in 3 weeks.  Our staff will call the home number listed on your records the next business day following your procedure to check on you and address any questions or concerns that you may have at that time regarding the information given to you following your procedure. This is a courtesy call and so if there is no answer at the home number and we have not heard from you through the emergency physician on call, we will assume that you have returned to your regular daily activities without incident.  SIGNATURES/CONFIDENTIALITY: You and/or your care partner have signed paperwork which will be entered into your electronic medical record.  These signatures attest to the fact that that the information above on your After Visit Summary has been reviewed and is understood.  Full responsibility of the confidentiality   of this discharge information lies with you and/or your care-partner.  Polyp, diverticulosis and high fiber diet information given.  No aspirin or anti-inflammatory medications for two weeks.

## 2014-01-25 ENCOUNTER — Telehealth: Payer: Self-pay

## 2014-01-25 NOTE — Telephone Encounter (Signed)
  Follow up Call-  Call back number 01/22/2014  Post procedure Call Back phone  # [phone]  Permission to leave phone message Yes     Patient questions:  Do you have a fever, pain , or abdominal swelling? no Pain Score  0 *  Have you tolerated food without any problems? yes  Have you been able to return to your normal activities? yes  Do you have any questions about your discharge instructions: Diet   no Medications  no Follow up visit  no  Do you have questions or concerns about your Care? no  Actions: * If pain score is 4 or above: No action needed, pain <4.  No problems per the pt. maw

## 2014-01-26 ENCOUNTER — Encounter: Payer: Self-pay | Admitting: Internal Medicine

## 2014-01-28 ENCOUNTER — Encounter: Payer: Self-pay | Admitting: *Deleted

## 2014-04-05 DIAGNOSIS — H524 Presbyopia: Secondary | ICD-10-CM | POA: Diagnosis not present

## 2014-04-05 DIAGNOSIS — H40019 Open angle with borderline findings, low risk, unspecified eye: Secondary | ICD-10-CM | POA: Diagnosis not present

## 2014-04-05 DIAGNOSIS — E119 Type 2 diabetes mellitus without complications: Secondary | ICD-10-CM | POA: Diagnosis not present

## 2014-04-05 DIAGNOSIS — H2510 Age-related nuclear cataract, unspecified eye: Secondary | ICD-10-CM | POA: Diagnosis not present

## 2014-06-01 ENCOUNTER — Encounter: Payer: Self-pay | Admitting: Internal Medicine

## 2014-06-04 ENCOUNTER — Encounter: Payer: Self-pay | Admitting: Internal Medicine

## 2014-07-01 ENCOUNTER — Other Ambulatory Visit (INDEPENDENT_AMBULATORY_CARE_PROVIDER_SITE_OTHER): Payer: Medicare Other

## 2014-07-01 ENCOUNTER — Encounter: Payer: Self-pay | Admitting: Internal Medicine

## 2014-07-01 ENCOUNTER — Ambulatory Visit (INDEPENDENT_AMBULATORY_CARE_PROVIDER_SITE_OTHER): Payer: Medicare Other | Admitting: Internal Medicine

## 2014-07-01 VITALS — BP 130/80 | HR 75 | Temp 98.2°F | Ht 72.0 in | Wt 230.0 lb

## 2014-07-01 DIAGNOSIS — E119 Type 2 diabetes mellitus without complications: Secondary | ICD-10-CM | POA: Diagnosis not present

## 2014-07-01 DIAGNOSIS — E785 Hyperlipidemia, unspecified: Secondary | ICD-10-CM | POA: Diagnosis not present

## 2014-07-01 DIAGNOSIS — N528 Other male erectile dysfunction: Secondary | ICD-10-CM | POA: Diagnosis not present

## 2014-07-01 DIAGNOSIS — I10 Essential (primary) hypertension: Secondary | ICD-10-CM

## 2014-07-01 DIAGNOSIS — Z23 Encounter for immunization: Secondary | ICD-10-CM

## 2014-07-01 LAB — MICROALBUMIN / CREATININE URINE RATIO
Creatinine,U: 29.8 mg/dL
Microalb Creat Ratio: 0.7 mg/g (ref 0.0–30.0)
Microalb, Ur: 0.2 mg/dL (ref 0.0–1.9)

## 2014-07-01 LAB — URINALYSIS, ROUTINE W REFLEX MICROSCOPIC
BILIRUBIN URINE: NEGATIVE
HGB URINE DIPSTICK: NEGATIVE
Ketones, ur: NEGATIVE
Leukocytes, UA: NEGATIVE
Nitrite: NEGATIVE
RBC / HPF: NONE SEEN (ref 0–?)
Specific Gravity, Urine: <=1.005 — AB (ref 1.000–1.030)
TOTAL PROTEIN, URINE-UPE24: NEGATIVE
Urine Glucose: NEGATIVE
Urobilinogen, UA: 0.2 (ref 0.0–1.0)
WBC, UA: NONE SEEN (ref 0–?)
pH: 7 (ref 5.0–8.0)

## 2014-07-01 LAB — HEMOGLOBIN A1C: Hgb A1c MFr Bld: 6.9 % — ABNORMAL HIGH (ref 4.6–6.5)

## 2014-07-01 LAB — CBC WITH DIFFERENTIAL/PLATELET
BASOS PCT: 1 % (ref 0.0–3.0)
Basophils Absolute: 0.1 10*3/uL (ref 0.0–0.1)
EOS ABS: 0.2 10*3/uL (ref 0.0–0.7)
Eosinophils Relative: 2.8 % (ref 0.0–5.0)
HCT: 40.3 % (ref 39.0–52.0)
Hemoglobin: 12.7 g/dL — ABNORMAL LOW (ref 13.0–17.0)
Lymphocytes Relative: 27.3 % (ref 12.0–46.0)
Lymphs Abs: 1.5 10*3/uL (ref 0.7–4.0)
MCHC: 31.6 g/dL (ref 30.0–36.0)
MCV: 82.3 fl (ref 78.0–100.0)
MONO ABS: 0.5 10*3/uL (ref 0.1–1.0)
Monocytes Relative: 8.5 % (ref 3.0–12.0)
NEUTROS PCT: 60.4 % (ref 43.0–77.0)
Neutro Abs: 3.3 10*3/uL (ref 1.4–7.7)
PLATELETS: 266 10*3/uL (ref 150.0–400.0)
RBC: 4.89 Mil/uL (ref 4.22–5.81)
RDW: 14.7 % (ref 11.5–15.5)
WBC: 5.5 10*3/uL (ref 4.0–10.5)

## 2014-07-01 LAB — LIPID PANEL
CHOL/HDL RATIO: 4
Cholesterol: 134 mg/dL (ref 0–200)
HDL: 36.5 mg/dL — AB (ref >39.00)
LDL CALC: 79 mg/dL (ref 0–99)
NonHDL: 97.5
TRIGLYCERIDES: 93 mg/dL (ref 0.0–149.0)
VLDL: 18.6 mg/dL (ref 0.0–40.0)

## 2014-07-01 LAB — BASIC METABOLIC PANEL
BUN: 11 mg/dL (ref 6–23)
CALCIUM: 10.2 mg/dL (ref 8.4–10.5)
CHLORIDE: 102 meq/L (ref 96–112)
CO2: 30 mEq/L (ref 19–32)
CREATININE: 1.1 mg/dL (ref 0.4–1.5)
GFR: 88.18 mL/min (ref >60.00)
Glucose, Bld: 133 mg/dL — ABNORMAL HIGH (ref 70–99)
Potassium: 5 mEq/L (ref 3.5–5.1)
Sodium: 139 mEq/L (ref 135–145)

## 2014-07-01 LAB — HEPATIC FUNCTION PANEL
ALT: 26 U/L (ref 0–53)
AST: 32 U/L (ref 0–37)
Albumin: 4.3 g/dL (ref 3.5–5.2)
Alkaline Phosphatase: 72 U/L (ref 39–117)
BILIRUBIN DIRECT: 0.2 mg/dL (ref 0.0–0.3)
BILIRUBIN TOTAL: 0.8 mg/dL (ref 0.2–1.2)
Total Protein: 8.4 g/dL — ABNORMAL HIGH (ref 6.0–8.3)

## 2014-07-01 LAB — TSH: TSH: 1.96 u[IU]/mL (ref 0.35–4.50)

## 2014-07-01 MED ORDER — SILDENAFIL CITRATE 20 MG PO TABS
ORAL_TABLET | ORAL | Status: DC
Start: 2014-07-01 — End: 2015-07-07

## 2014-07-01 MED ORDER — TADALAFIL 20 MG PO TABS: 10.0000 mg | ORAL_TABLET | ORAL | Status: DC | PRN

## 2014-07-01 NOTE — Progress Notes (Signed)
Pre visit review using our clinic review tool, if applicable. No additional management support is needed unless otherwise documented below in the visit note. 

## 2014-07-01 NOTE — Assessment & Plan Note (Signed)
stable overall by history and exam, recent data reviewed with pt, and pt to continue medical treatment as before,  to f/u any worsening symptoms or concerns BP Readings from Last 3 Encounters:  07/01/14 130/80  01/22/14 125/47  12/24/13 150/80

## 2014-07-01 NOTE — Assessment & Plan Note (Signed)
stable overall by history and exam, recent data reviewed with pt, and pt to continue medical treatment as before,  to f/u any worsening symptoms or concerns Lab Results  Component Value Date   [place] 90 12/24/2013   For f/u lab today

## 2014-07-01 NOTE — Progress Notes (Signed)
Subjective:    Patient ID: Brian [name], male    DOB: 09-01-1936, 78 y.o.   MRN: [id number]  HPI  Here for yearly f/u;  Overall doing ok;  Pt denies CP, worsening SOB, DOE, wheezing, orthopnea, PND, worsening LE edema, palpitations, dizziness or syncope.  Pt denies neurological change such as new headache, facial or extremity weakness.  Pt denies polydipsia, polyuria, or low sugar symptoms. Pt states overall good compliance with treatment and medications, good tolerability, and has been trying to follow lower cholesterol diet.  Pt denies worsening depressive symptoms, suicidal ideation or panic. No fever, night sweats, wt loss, loss of appetite, or other constitutional symptoms.  Pt states good ability with ADL's, has low fall risk, home safety reviewed and adequate, no other significant changes in hearing or vision, and only occasionally active with exercise.  Has been trying to work on diet better, lost 2 lbs since April 2015. No other complaints except cialis is too expensive, as well as [place] Past Medical History  Diagnosis Date  . Diabetes mellitus type II   . Hyperlipidemia   . Hypertension   . BPH (benign prostatic hypertrophy)   . Obesity   . Chronic anemia   . History of prostatitis   . ED (erectile dysfunction)   . Right eye trauma     hx as child  . Allergic rhinitis   . Nephrolithiasis 12/07/2010  . ALLERGIC RHINITIS 10/13/2007  . ANEMIA, CHRONIC DISEASE NEC 03/31/2007  . BENIGN PROSTATIC HYPERTROPHY 03/31/2007  . DIABETES MELLITUS, TYPE II 03/31/2007  . [place] DISEASE, LUMBAR 03/31/2007  . HYPERLIPIDEMIA 03/31/2007  . HYPERTENSION 03/31/2007  . POLYP, ANAL AND RECTAL 03/31/2007  . Chest pain     Stress echo, normal, December, 2012  . Ejection fraction     EF  normal, stress echo, December,  2012   Past Surgical History  Procedure Laterality Date  . Rectal polypectomy      Transanal excision 10/2005  . Lumbar disc surgery      1999  . Prostate biopsy      s/p  . Skin  biopsy      s/p right upper back 2009- benign Dr. Tonia Brooms    reports that he has never smoked. He does not have any smokeless tobacco history on file. He reports that he drinks alcohol. He reports that he does not use illicit drugs. family history includes Colon polyps in his mother. No Known Allergies Current Outpatient Prescriptions on File Prior to Visit  Medication Sig Dispense Refill  . amLODipine (NORVASC) 10 MG tablet Take 1 tablet (10 mg total) by mouth daily.  90 tablet  3  . aspirin 81 MG EC tablet Take 81 mg by mouth daily.        [name] atorvastatin (LIPITOR) 20 MG tablet Take 1 tablet (20 mg total) by mouth daily.  90 tablet  3  . clobetasol cream (TEMOVATE) 7.82 % Apply 1 application topically 2 (two) times daily.  60 g  1  . fluticasone (FLONASE) 50 MCG/ACT nasal spray Place 2 sprays into the nose daily.  16 g  11  . glimepiride (AMARYL) 4 MG tablet Take 1 tablet (4 mg total) by mouth 2 (two) times daily.  180 tablet  3  . glucose blood (ACCU-CHEK COMFORT CURVE) test strip Use as directed once daily to check blood sugar.  Diagnosis code 250.00  100 each  11  . metFORMIN (GLUCOPHAGE-XR) 500 MG 24 hr tablet Take 3 by mouth  every morning  270 tablet  3  . olmesartan (BENICAR) 40 MG tablet Take 0.5 tablets (20 mg total) by mouth 2 (two) times daily.  90 tablet  3  . sitaGLIPtin (JANUVIA) 100 MG tablet Take 1 tablet (100 mg total) by mouth daily.  90 tablet  3   No current facility-administered medications on file prior to visit.    Review of Systems Constitutional: Negative for increased diaphoresis, other activity, appetite or other siginficant weight change  HENT: Negative for worsening hearing loss, ear pain, facial swelling, mouth sores and neck stiffness.   Eyes: Negative for other worsening pain, redness or visual disturbance.  Respiratory: Negative for shortness of breath and wheezing.   Cardiovascular: Negative for chest pain and palpitations.  Gastrointestinal: Negative for  diarrhea, blood in stool, abdominal distention or other pain Genitourinary: Negative for hematuria, flank pain or change in urine volume.  Musculoskeletal: Negative for myalgias or other joint complaints.  Skin: Negative for color change and wound.  Neurological: Negative for syncope and numbness. other than noted Hematological: Negative for adenopathy. or other swelling Psychiatric/Behavioral: Negative for hallucinations, self-injury, decreased concentration or other worsening agitation.      Objective:   Physical Exam BP 130/80  Pulse 75  Temp(Src) 98.2 F (36.8 C) (Oral)  Ht 6' (1.829 m)  Wt 230 lb (104.327 kg)  BMI 31.19 kg/m2  SpO2 96% VS noted,  Constitutional: Pt is oriented to person, place, and time. Appears well-developed and well-nourished.  Head: Normocephalic and atraumatic.  Right Ear: External ear normal.  Left Ear: External ear normal.  Nose: Nose normal.  Mouth/Throat: Oropharynx is clear and moist.  Eyes: Conjunctivae and EOM are normal. Pupils are equal, round, and reactive to light.  Neck: Normal range of motion. Neck supple. No JVD present. No tracheal deviation present.  Cardiovascular: Normal rate, regular rhythm, normal heart sounds and intact distal pulses.   Pulmonary/Chest: Effort normal and breath sounds without rales or wheezing  Abdominal: Soft. Bowel sounds are normal. NT. No HSM  Musculoskeletal: Normal range of motion. Exhibits no edema.  Lymphadenopathy:  Has no cervical adenopathy.  Neurological: Pt is alert and oriented to person, place, and time. Pt has normal reflexes. No cranial nerve deficit. Motor grossly intact Skin: Skin is warm and dry. No rash noted.  Psychiatric:  Has normal mood and affect. Behavior is normal.    Wt Readings from Last 3 Encounters:  07/01/14 230 lb (104.327 kg)  12/24/13 231 lb 4 oz (104.894 kg)  12/24/13 232 lb (105.235 kg)      Assessment & Plan:

## 2014-07-01 NOTE — Patient Instructions (Addendum)
You had the flu shot today  Please check with your pharmacist about the cost difference regarding Januvia vs Onglyza, and Tradjenta  I have also given the hardcopy for the cialis, as well as the new generic Viagra so you can check prices  Please continue all other medications as before, and refills have been done if requested.  Please have the pharmacy call with any other refills you may need.  Please continue your efforts at being more active, low cholesterol diet, and weight control.  You are otherwise up to date with prevention measures today.  Please keep your appointments with your specialists as you may have planned  Please go to the LAB in the Basement (turn left off the elevator) for the tests to be done today  You will be contacted by phone if any changes need to be made immediately.  Otherwise, you will receive a letter about your results with an explanation, but please check with MyChart first.  Please remember to sign up for MyChart if you have not done so, as this will be important to you in the future with finding out test results, communicating by private email, and scheduling acute appointments online when needed.  Please return in 6 months, or sooner if needed

## 2014-07-01 NOTE — Assessment & Plan Note (Signed)
Ok for cialis vs generic viagra - gave rx,  to f/u any worsening symptoms or concerns

## 2014-07-01 NOTE — Assessment & Plan Note (Signed)
stable overall by history and exam, recent data reviewed with pt, and pt to continue medical treatment as before,  to f/u any worsening symptoms or concerns le Lab Results  Component Value Date   HGBA1C 7.6* 12/24/2013

## 2014-08-02 DIAGNOSIS — N401 Enlarged prostate with lower urinary tract symptoms: Secondary | ICD-10-CM | POA: Diagnosis not present

## 2014-08-02 DIAGNOSIS — R351 Nocturia: Secondary | ICD-10-CM | POA: Diagnosis not present

## 2014-08-02 DIAGNOSIS — N5201 Erectile dysfunction due to arterial insufficiency: Secondary | ICD-10-CM | POA: Diagnosis not present

## 2014-10-13 DIAGNOSIS — H40013 Open angle with borderline findings, low risk, bilateral: Secondary | ICD-10-CM | POA: Diagnosis not present

## 2014-10-13 DIAGNOSIS — H21551 Recession of chamber angle, right eye: Secondary | ICD-10-CM | POA: Diagnosis not present

## 2014-12-30 ENCOUNTER — Ambulatory Visit: Payer: Medicare Other | Admitting: Internal Medicine

## 2014-12-30 ENCOUNTER — Ambulatory Visit (INDEPENDENT_AMBULATORY_CARE_PROVIDER_SITE_OTHER): Payer: Medicare Other | Admitting: Internal Medicine

## 2014-12-30 ENCOUNTER — Other Ambulatory Visit (INDEPENDENT_AMBULATORY_CARE_PROVIDER_SITE_OTHER): Payer: Medicare Other

## 2014-12-30 ENCOUNTER — Encounter: Payer: Self-pay | Admitting: Internal Medicine

## 2014-12-30 VITALS — BP 130/72 | HR 65 | Temp 98.8°F | Resp 16 | Ht 72.0 in | Wt 228.0 lb

## 2014-12-30 DIAGNOSIS — I10 Essential (primary) hypertension: Secondary | ICD-10-CM | POA: Diagnosis not present

## 2014-12-30 DIAGNOSIS — E119 Type 2 diabetes mellitus without complications: Secondary | ICD-10-CM | POA: Diagnosis not present

## 2014-12-30 DIAGNOSIS — E785 Hyperlipidemia, unspecified: Secondary | ICD-10-CM

## 2014-12-30 LAB — LIPID PANEL
Cholesterol: 136 mg/dL (ref 0–200)
HDL: 42.5 mg/dL (ref >39.00)
LDL CALC: 71 mg/dL (ref 0–99)
NonHDL: 93.5
TRIGLYCERIDES: 111 mg/dL (ref 0.0–149.0)
Total CHOL/HDL Ratio: 3
VLDL: 22.2 mg/dL (ref 0.0–40.0)

## 2014-12-30 LAB — HEPATIC FUNCTION PANEL
ALBUMIN: 4.4 g/dL (ref 3.5–5.2)
ALT: 22 U/L (ref 0–53)
AST: 21 U/L (ref 0–37)
Alkaline Phosphatase: 77 U/L (ref 39–117)
Bilirubin, Direct: 0.1 mg/dL (ref 0.0–0.3)
Total Bilirubin: 0.7 mg/dL (ref 0.2–1.2)
Total Protein: 7.6 g/dL (ref 6.0–8.3)

## 2014-12-30 LAB — BASIC METABOLIC PANEL
BUN: 13 mg/dL (ref 6–23)
CHLORIDE: 104 meq/L (ref 96–112)
CO2: 29 meq/L (ref 19–32)
Calcium: 10.1 mg/dL (ref 8.4–10.5)
Creatinine, Ser: 1.03 mg/dL (ref 0.40–1.50)
GFR: 90.05 mL/min (ref >60.00)
Glucose, Bld: 173 mg/dL — ABNORMAL HIGH (ref 70–99)
POTASSIUM: 4.9 meq/L (ref 3.5–5.1)
SODIUM: 137 meq/L (ref 135–145)

## 2014-12-30 LAB — HEMOGLOBIN A1C: HEMOGLOBIN A1C: 6.7 % — AB (ref 4.6–6.5)

## 2014-12-30 MED ORDER — ATORVASTATIN CALCIUM 20 MG PO TABS: 20.0000 mg | ORAL_TABLET | Freq: Every day | ORAL | Status: DC

## 2014-12-30 MED ORDER — AMLODIPINE BESYLATE 10 MG PO TABS: 10.0000 mg | ORAL_TABLET | Freq: Every day | ORAL | Status: DC

## 2014-12-30 MED ORDER — GLIMEPIRIDE 4 MG PO TABS
4.0000 mg | ORAL_TABLET | Freq: Two times a day (BID) | ORAL | Status: DC
Start: 2014-12-30 — End: 2016-01-03

## 2014-12-30 MED ORDER — OLMESARTAN MEDOXOMIL 40 MG PO TABS: 20.0000 mg | ORAL_TABLET | Freq: Two times a day (BID) | ORAL | Status: DC

## 2014-12-30 MED ORDER — METFORMIN HCL ER 500 MG PO TB24: ORAL_TABLET | ORAL | Status: DC

## 2014-12-30 MED ORDER — SITAGLIPTIN PHOSPHATE 100 MG PO TABS: 100.0000 mg | ORAL_TABLET | Freq: Every day | ORAL | Status: DC

## 2014-12-30 NOTE — Assessment & Plan Note (Signed)
stable overall by history and exam, recent data reviewed with pt, and pt to continue medical treatment as before,  to f/u any worsening symptoms or concerns Lab Results  Component Value Date   HGBA1C 6.9* 07/01/2014   Overall improved, for lab f/u, if cont' to improve would consider decr glimeparie by half

## 2014-12-30 NOTE — Assessment & Plan Note (Signed)
stable overall by history and exam, recent data reviewed with pt, and pt to continue medical treatment as before,  to f/u any worsening symptoms or concerns Lab Results  Component Value Date   LDLCALC 79 07/01/2014

## 2014-12-30 NOTE — Assessment & Plan Note (Signed)
stable overall by history and exam, recent data reviewed with pt, and pt to continue medical treatment as before,  to f/u any worsening symptoms or concerns BP Readings from Last 3 Encounters:  12/30/14 130/72  07/01/14 130/80  01/22/14 125/47

## 2014-12-30 NOTE — Progress Notes (Signed)
Pre visit review using our clinic review tool, if applicable. No additional management support is needed unless otherwise documented below in the visit note. 

## 2014-12-30 NOTE — Progress Notes (Signed)
Subjective:    Patient ID: Brian [name], male    DOB: [birth year], 79 y.o.   MRN: [id number]  HPI  Here to f/u; overall doing ok,  Pt denies chest pain, increasing sob or doe, wheezing, orthopnea, PND, increased LE swelling, palpitations, dizziness or syncope.  Pt denies new neurological symptoms such as new headache, or facial or extremity weakness or numbness.  Pt denies polydipsia, polyuria, or low sugar episode.   Pt denies new neurological symptoms such as new headache, or facial or extremity weakness or numbness.   Pt states overall good compliance with meds, mostly trying to follow appropriate diet, with wt overall stable,  but more exercise lately with treadmill and other machines at gym.  Wt Readings from Last 3 Encounters:  12/30/14 228 lb (103.42 kg)  07/01/14 230 lb (104.327 kg)  12/24/13 231 lb 4 oz (104.894 kg)   Past Medical History  Diagnosis Date  . Diabetes mellitus type II   . Hyperlipidemia   . Hypertension   . BPH (benign prostatic hypertrophy)   . Obesity   . Chronic anemia   . History of prostatitis   . ED (erectile dysfunction)   . Right eye trauma     hx as child  . Allergic rhinitis   . Nephrolithiasis 12/07/2010  . ALLERGIC RHINITIS 10/13/2007  . ANEMIA, CHRONIC DISEASE NEC 03/31/2007  . BENIGN PROSTATIC HYPERTROPHY 03/31/2007  . DIABETES MELLITUS, TYPE II 03/31/2007  . [place] DISEASE, LUMBAR 03/31/2007  . HYPERLIPIDEMIA 03/31/2007  . HYPERTENSION 03/31/2007  . POLYP, ANAL AND RECTAL 03/31/2007  . Chest pain     Stress echo, normal, December, 2012  . Ejection fraction     EF  normal, stress echo, December,  2012   Past Surgical History  Procedure Laterality Date  . Rectal polypectomy      Transanal excision 10/2005  . Lumbar disc surgery      1999  . Prostate biopsy      s/p  . Skin biopsy      s/p right upper back 2009- benign Dr. Tonia Brooms    reports that he has never smoked. He does not have any smokeless tobacco history on file. He reports that he  drinks alcohol. He reports that he does not use illicit drugs. family history includes Colon polyps in his mother. No Known Allergies Current Outpatient Prescriptions on File Prior to Visit  Medication Sig Dispense Refill  . aspirin 81 MG EC tablet Take 81 mg by mouth daily.      . clobetasol cream (TEMOVATE) 4.26 % Apply 1 application topically 2 (two) times daily. 60 g 1  . fluticasone (FLONASE) 50 MCG/ACT nasal spray Place 2 sprays into the nose daily. 16 g 11  . glucose blood (ACCU-CHEK COMFORT CURVE) test strip Use as directed once daily to check blood sugar.  Diagnosis code 250.00 100 each 11  . sildenafil (REVATIO) 20 MG tablet 3-5 tabs by mouth every other day as needed 40 tablet 11  . tadalafil (CIALIS) 20 MG tablet Take 0.5 tablets (10 mg total) by mouth every other day as needed. 10 tablet 11   No current facility-administered medications on file prior to visit.    Review of Systems  Constitutional: Negative for unusual diaphoresis or night sweats HENT: Negative for ringing in ear or discharge Eyes: Negative for double vision or worsening visual disturbance.  Respiratory: Negative for choking and stridor.   Gastrointestinal: Negative for vomiting or other signifcant bowel change Genitourinary: Negative  for hematuria or change in urine volume.  Musculoskeletal: Negative for other MSK pain or swelling Skin: Negative for color change and worsening wound.  Neurological: Negative for tremors and numbness other than noted  Psychiatric/Behavioral: Negative for decreased concentration or agitation other than above       Objective:   Physical Exam BP 130/72 mmHg  Pulse 65  Temp(Src) 98.8 F (37.1 C) (Oral)  Resp 16  Ht 6' (1.829 m)  Wt 228 lb (103.42 kg)  BMI 30.92 kg/m2  SpO2 96% VS noted,  Constitutional: Pt appears in no significant distress HENT: Head: NCAT.  Right Ear: External ear normal.  Left Ear: External ear normal.  Eyes: . Pupils are equal, round, and  reactive to light. Conjunctivae and EOM are normal Neck: Normal range of motion. Neck supple.  Cardiovascular: Normal rate and regular rhythm.   Pulmonary/Chest: Effort normal and breath sounds without rales or wheezing.  Abd:  Soft, NT, ND, + BS Neurological: Pt is alert. Not confused , motor grossly intact Skin: Skin is warm. No rash, no LE edema Psychiatric: Pt behavior is normal. No agitation.        Assessment & Plan:

## 2014-12-30 NOTE — Patient Instructions (Signed)
Please continue all other medications as before, and refills have been done if requested.  Please have the pharmacy call with any other refills you may need.  Please continue your efforts at being more active, low cholesterol diet, and weight control.  Please keep your appointments with your specialists as you may have planned  We may be able to reduce the glimeparide if your A1c continues to improve  Please go to the LAB in the Basement (turn left off the elevator) for the tests to be done today  You will be contacted by phone if any changes need to be made immediately.  Otherwise, you will receive a letter about your results with an explanation, but please check with MyChart first.  Please remember to sign up for MyChart if you have not done so, as this will be important to you in the future with finding out test results, communicating by private email, and scheduling acute appointments online when needed.  Please return in 6 months, or sooner if needed

## 2014-12-31 ENCOUNTER — Ambulatory Visit: Payer: Medicare Other | Admitting: Internal Medicine

## 2015-01-07 ENCOUNTER — Telehealth: Payer: Self-pay | Admitting: Internal Medicine

## 2015-01-07 NOTE — Telephone Encounter (Signed)
Please advise. No indication that there should have been a change from 2 tabs daily to 3 tabs daily.

## 2015-01-07 NOTE — Telephone Encounter (Signed)
Patient states pharmacy told him that there was a change in his metformin.  He is requesting call back in regards.

## 2015-01-07 NOTE — Telephone Encounter (Signed)
Yes, that is true, as his overall blood sugar was mildly too high at the last check  Please continue this and all other medicaiton

## 2015-01-08 NOTE — Telephone Encounter (Signed)
Can you review the dosage of the metformin per day and indicate exactly how the pt is suppose to take?  Pt stated that he was not told to take 3 tabs of metformin daily.

## 2015-01-11 MED ORDER — METFORMIN HCL ER 500 MG PO TB24: ORAL_TABLET | ORAL | Status: DC

## 2015-01-11 NOTE — Telephone Encounter (Signed)
Advised pt of dr Judi Cong note to take 2 metformin

## 2015-01-11 NOTE — Telephone Encounter (Signed)
Ok to take 2 if that what he has been taking, I changed the med list

## 2015-04-07 DIAGNOSIS — H40013 Open angle with borderline findings, low risk, bilateral: Secondary | ICD-10-CM | POA: Diagnosis not present

## 2015-04-07 DIAGNOSIS — Z961 Presence of intraocular lens: Secondary | ICD-10-CM | POA: Diagnosis not present

## 2015-04-07 DIAGNOSIS — E119 Type 2 diabetes mellitus without complications: Secondary | ICD-10-CM | POA: Diagnosis not present

## 2015-04-07 DIAGNOSIS — H21551 Recession of chamber angle, right eye: Secondary | ICD-10-CM | POA: Diagnosis not present

## 2015-04-07 LAB — HM DIABETES EYE EXAM

## 2015-04-19 ENCOUNTER — Encounter: Payer: Self-pay | Admitting: Internal Medicine

## 2015-07-07 ENCOUNTER — Encounter: Payer: Self-pay | Admitting: Internal Medicine

## 2015-07-07 ENCOUNTER — Other Ambulatory Visit: Payer: Self-pay | Admitting: Internal Medicine

## 2015-07-07 ENCOUNTER — Ambulatory Visit (INDEPENDENT_AMBULATORY_CARE_PROVIDER_SITE_OTHER): Payer: Medicare Other | Admitting: Internal Medicine

## 2015-07-07 ENCOUNTER — Other Ambulatory Visit (INDEPENDENT_AMBULATORY_CARE_PROVIDER_SITE_OTHER): Payer: Medicare Other

## 2015-07-07 VITALS — BP 158/72 | HR 63 | Temp 97.9°F | Wt 227.0 lb

## 2015-07-07 DIAGNOSIS — N320 Bladder-neck obstruction: Secondary | ICD-10-CM

## 2015-07-07 DIAGNOSIS — E119 Type 2 diabetes mellitus without complications: Secondary | ICD-10-CM

## 2015-07-07 DIAGNOSIS — Z23 Encounter for immunization: Secondary | ICD-10-CM | POA: Diagnosis not present

## 2015-07-07 DIAGNOSIS — I10 Essential (primary) hypertension: Secondary | ICD-10-CM

## 2015-07-07 DIAGNOSIS — E785 Hyperlipidemia, unspecified: Secondary | ICD-10-CM

## 2015-07-07 LAB — CBC WITH DIFFERENTIAL/PLATELET
BASOS ABS: 0.1 10*3/uL (ref 0.0–0.1)
Basophils Relative: 1.2 % (ref 0.0–3.0)
EOS ABS: 0.2 10*3/uL (ref 0.0–0.7)
Eosinophils Relative: 3.3 % (ref 0.0–5.0)
HEMATOCRIT: 39.5 % (ref 39.0–52.0)
Hemoglobin: 12.6 g/dL — ABNORMAL LOW (ref 13.0–17.0)
LYMPHS ABS: 1.8 10*3/uL (ref 0.7–4.0)
LYMPHS PCT: 30.6 % (ref 12.0–46.0)
MCHC: 32 g/dL (ref 30.0–36.0)
MCV: 81.5 fl (ref 78.0–100.0)
Monocytes Absolute: 0.5 10*3/uL (ref 0.1–1.0)
Monocytes Relative: 9.2 % (ref 3.0–12.0)
NEUTROS ABS: 3.2 10*3/uL (ref 1.4–7.7)
NEUTROS PCT: 55.7 % (ref 43.0–77.0)
PLATELETS: 247 10*3/uL (ref 150.0–400.0)
RBC: 4.84 Mil/uL (ref 4.22–5.81)
RDW: 14.4 % (ref 11.5–15.5)
WBC: 5.7 10*3/uL (ref 4.0–10.5)

## 2015-07-07 LAB — URINALYSIS, ROUTINE W REFLEX MICROSCOPIC
BILIRUBIN URINE: NEGATIVE
Hgb urine dipstick: NEGATIVE
KETONES UR: NEGATIVE
Leukocytes, UA: NEGATIVE
Nitrite: NEGATIVE
PH: 7 (ref 5.0–8.0)
RBC / HPF: NONE SEEN (ref 0–?)
Specific Gravity, Urine: <=1.005 — AB (ref 1.000–1.030)
TOTAL PROTEIN, URINE-UPE24: NEGATIVE
UROBILINOGEN UA: 0.2 (ref 0.0–1.0)
Urine Glucose: NEGATIVE
WBC, UA: NONE SEEN (ref 0–?)

## 2015-07-07 LAB — BASIC METABOLIC PANEL
BUN: 15 mg/dL (ref 6–23)
CALCIUM: 10.4 mg/dL (ref 8.4–10.5)
CO2: 30 meq/L (ref 19–32)
Chloride: 102 mEq/L (ref 96–112)
Creatinine, Ser: 0.98 mg/dL (ref 0.40–1.50)
GFR: 95.24 mL/min (ref >60.00)
Glucose, Bld: 150 mg/dL — ABNORMAL HIGH (ref 70–99)
Potassium: 5 mEq/L (ref 3.5–5.1)
Sodium: 140 mEq/L (ref 135–145)

## 2015-07-07 LAB — HEPATIC FUNCTION PANEL
ALK PHOS: 77 U/L (ref 39–117)
ALT: 26 U/L (ref 0–53)
AST: 22 U/L (ref 0–37)
Albumin: 4.7 g/dL (ref 3.5–5.2)
BILIRUBIN DIRECT: 0.2 mg/dL (ref 0.0–0.3)
TOTAL PROTEIN: 7.8 g/dL (ref 6.0–8.3)
Total Bilirubin: 0.8 mg/dL (ref 0.2–1.2)

## 2015-07-07 LAB — TSH: TSH: 1.34 u[IU]/mL (ref 0.35–4.50)

## 2015-07-07 LAB — LIPID PANEL
CHOLESTEROL: 147 mg/dL (ref 0–200)
HDL: 48.3 mg/dL (ref >39.00)
LDL Cholesterol: 74 mg/dL (ref 0–99)
NonHDL: 98.71
TRIGLYCERIDES: 122 mg/dL (ref 0.0–149.0)
Total CHOL/HDL Ratio: 3
VLDL: 24.4 mg/dL (ref 0.0–40.0)

## 2015-07-07 LAB — MICROALBUMIN / CREATININE URINE RATIO
CREATININE, U: 31.8 mg/dL
MICROALB/CREAT RATIO: 2.2 mg/g (ref 0.0–30.0)
Microalb, Ur: <0.7 mg/dL (ref 0.0–1.9)

## 2015-07-07 LAB — HEMOGLOBIN A1C: Hgb A1c MFr Bld: 7.7 % — ABNORMAL HIGH (ref 4.6–6.5)

## 2015-07-07 LAB — PSA: PSA: 3.92 ng/mL (ref 0.10–4.00)

## 2015-07-07 MED ORDER — METFORMIN HCL ER 500 MG PO TB24: ORAL_TABLET | ORAL | Status: DC

## 2015-07-07 NOTE — Progress Notes (Signed)
Subjective:    Patient ID: Brian [name], male    DOB: Jun 11, 1936, 79 y.o.   MRN: [id number]  HPI Here foryearly f/u;  Overall doing ok;  Pt denies Chest pain, worsening SOB, DOE, wheezing, orthopnea, PND, worsening LE edema, palpitations, dizziness or syncope.  Pt denies neurological change such as new headache, facial or extremity weakness.  Pt denies polydipsia, polyuria, or low sugar symptoms. Pt states overall good compliance with treatment and medications, good tolerability, and has been trying to follow appropriate diet.  Pt denies worsening depressive symptoms, suicidal ideation or panic. No fever, night sweats, wt loss, loss of appetite, or other constitutional symptoms.  Pt states good ability with ADL's, has low fall risk, home safety reviewed and adequate, no other significant changes in hearing or vision, and occasionally active with exercise. With gym 2 times per wk, plays golf twice weekly, and walks all other days.  BP at home 135-145 most often, delcines further meds BP Readings from Last 3 Encounters:  07/07/15 158/72  12/30/14 130/72  07/01/14 130/80   Past Medical History  Diagnosis Date  . Diabetes mellitus type II   . Hyperlipidemia   . Hypertension   . BPH (benign prostatic hypertrophy)   . Obesity   . Chronic anemia   . History of prostatitis   . ED (erectile dysfunction)   . Right eye trauma     hx as child  . Allergic rhinitis   . Nephrolithiasis 12/07/2010  . ALLERGIC RHINITIS 10/13/2007  . ANEMIA, CHRONIC DISEASE NEC 03/31/2007  . BENIGN PROSTATIC HYPERTROPHY 03/31/2007  . DIABETES MELLITUS, TYPE II 03/31/2007  . [place] DISEASE, LUMBAR 03/31/2007  . HYPERLIPIDEMIA 03/31/2007  . HYPERTENSION 03/31/2007  . POLYP, ANAL AND RECTAL 03/31/2007  . Chest pain     Stress echo, normal, December, 2012  . Ejection fraction     EF  normal, stress echo, December,  2012   Past Surgical History  Procedure Laterality Date  . Rectal polypectomy      Transanal excision 10/2005    . Lumbar disc surgery      1999  . Prostate biopsy      s/p  . Skin biopsy      s/p right upper back 2009- benign Dr. Tonia Brooms    reports that he has never smoked. He does not have any smokeless tobacco history on file. He reports that he drinks alcohol. He reports that he does not use illicit drugs. family history includes Colon polyps in his mother. No Known Allergies Current Outpatient Prescriptions on File Prior to Visit  Medication Sig Dispense Refill  . amLODipine (NORVASC) 10 MG tablet Take 1 tablet (10 mg total) by mouth daily. 90 tablet 3  . aspirin 81 MG EC tablet Take 81 mg by mouth daily.      [name] atorvastatin (LIPITOR) 20 MG tablet Take 1 tablet (20 mg total) by mouth daily. 90 tablet 3  . clobetasol cream (TEMOVATE) 9.62 % Apply 1 application topically 2 (two) times daily. 60 g 1  . fluticasone (FLONASE) 50 MCG/ACT nasal spray Place 2 sprays into the nose daily. 16 g 11  . glimepiride (AMARYL) 4 MG tablet Take 1 tablet (4 mg total) by mouth 2 (two) times daily. 180 tablet 3  . glucose blood (ACCU-CHEK COMFORT CURVE) test strip Use as directed once daily to check blood sugar.  Diagnosis code 250.00 100 each 11  . metFORMIN (GLUCOPHAGE-XR) 500 MG 24 hr tablet Take 2 by mouth every  morning 180 tablet 3  . olmesartan (BENICAR) 40 MG tablet Take 0.5 tablets (20 mg total) by mouth 2 (two) times daily. 90 tablet 3  . sitaGLIPtin (JANUVIA) 100 MG tablet Take 1 tablet (100 mg total) by mouth daily. 90 tablet 3  . sildenafil (REVATIO) 20 MG tablet 3-5 tabs by mouth every other day as needed (Patient not taking: Reported on 07/07/2015) 40 tablet 11  . tadalafil (CIALIS) 20 MG tablet Take 0.5 tablets (10 mg total) by mouth every other day as needed. (Patient not taking: Reported on 07/07/2015) 10 tablet 11   No current facility-administered medications on file prior to visit.   Review of Systems Constitutional: Negative for increased diaphoresis, other activity, appetite or siginficant  weight change other than noted HENT: Negative for worsening hearing loss, ear pain, facial swelling, mouth sores and neck stiffness.   Eyes: Negative for other worsening pain, redness or visual disturbance.  Respiratory: Negative for shortness of breath and wheezing  Cardiovascular: Negative for chest pain and palpitations.  Gastrointestinal: Negative for diarrhea, blood in stool, abdominal distention or other pain Genitourinary: Negative for hematuria, flank pain or change in urine volume.  Musculoskeletal: Negative for myalgias or other joint complaints.  Skin: Negative for color change and wound or drainage.  Neurological: Negative for syncope and numbness. other than noted Hematological: Negative for adenopathy. or other swelling Psychiatric/Behavioral: Negative for hallucinations, SI, self-injury, decreased concentration or other worsening agitation.      Objective:   Physical Exam BP 158/72 mmHg  Pulse 63  Temp(Src) 97.9 F (36.6 C)  Wt 227 lb (102.967 kg)  SpO2 98% VS noted,  Constitutional: Pt is oriented to person, place, and time. Appears well-developed and well-nourished, in no significant distress Head: Normocephalic and atraumatic.  Right Ear: External ear normal.  Left Ear: External ear normal.  Nose: Nose normal.  Mouth/Throat: Oropharynx is clear and moist.  Eyes: Conjunctivae and EOM are normal. Pupils are equal, round, and reactive to light.  Neck: Normal range of motion. Neck supple. No JVD present. No tracheal deviation present or significant neck LA or mass Cardiovascular: Normal rate, regular rhythm, normal heart sounds and intact distal pulses.   Pulmonary/Chest: Effort normal and breath sounds without rales or wheezing  Abdominal: Soft. Bowel sounds are normal. NT. No HSM  Musculoskeletal: Normal range of motion. Exhibits no edema.  Lymphadenopathy:  Has no cervical adenopathy.  Neurological: Pt is alert and oriented to person, place, and time. Pt has  normal reflexes. No cranial nerve deficit. Motor grossly intact Skin: Skin is warm and dry. No rash noted.  Psychiatric:  Has normal mood and affect. Behavior is normal.     Assessment & Plan:

## 2015-07-07 NOTE — Assessment & Plan Note (Signed)
stable overall by history and exam, recent data reviewed with pt, and pt to continue medical treatment as before,  to f/u any worsening symptoms or concerns Lab Results  Component Value Date   LDLCALC 71 12/30/2014

## 2015-07-07 NOTE — Assessment & Plan Note (Signed)
stable overall by history and exam, recent data reviewed with pt, and pt to continue medical treatment as before,  to f/u any worsening symptoms or concerns Lab Results  Component Value Date   HGBA1C 6.7* 12/30/2014

## 2015-07-07 NOTE — Assessment & Plan Note (Signed)
Mild elev today, has not taken med yet this am, o/w stable overall by history and exam, recent data reviewed with pt, and pt to continue medical treatment as before,  to f/u any worsening symptoms or concerns BP Readings from Last 3 Encounters:  07/07/15 158/72  12/30/14 130/72  07/01/14 130/80

## 2015-07-07 NOTE — Patient Instructions (Addendum)

## 2015-07-07 NOTE — Progress Notes (Signed)
Patient received education resource, including the self-management goal and tool. Patient verbalized understanding. 

## 2015-07-13 ENCOUNTER — Encounter: Payer: Self-pay | Admitting: *Deleted

## 2015-09-26 DIAGNOSIS — R3912 Poor urinary stream: Secondary | ICD-10-CM | POA: Diagnosis not present

## 2015-09-26 DIAGNOSIS — N5201 Erectile dysfunction due to arterial insufficiency: Secondary | ICD-10-CM | POA: Diagnosis not present

## 2015-09-26 DIAGNOSIS — Z0000 Encounter for general adult medical examination without abnormal findings: Secondary | ICD-10-CM | POA: Diagnosis not present

## 2015-09-26 DIAGNOSIS — N400 Enlarged prostate without lower urinary tract symptoms: Secondary | ICD-10-CM | POA: Diagnosis not present

## 2015-10-12 ENCOUNTER — Encounter: Payer: Self-pay | Admitting: Podiatry

## 2015-10-12 ENCOUNTER — Ambulatory Visit (INDEPENDENT_AMBULATORY_CARE_PROVIDER_SITE_OTHER): Payer: Medicare Other

## 2015-10-12 ENCOUNTER — Ambulatory Visit (INDEPENDENT_AMBULATORY_CARE_PROVIDER_SITE_OTHER): Payer: Medicare Other | Admitting: Podiatry

## 2015-10-12 VITALS — BP 156/90 | HR 78 | Resp 12

## 2015-10-12 DIAGNOSIS — R52 Pain, unspecified: Secondary | ICD-10-CM | POA: Diagnosis not present

## 2015-10-12 DIAGNOSIS — S93402A Sprain of unspecified ligament of left ankle, initial encounter: Secondary | ICD-10-CM | POA: Diagnosis not present

## 2015-10-12 NOTE — Progress Notes (Signed)
   Subjective:    Patient ID: Brian [name], male    DOB: November 09, 1935, 80 y.o.   MRN: [id number]  HPI   This patient presents today describing a painful swollen left ankle that he noticed approximately one week ago. He describes working outside on uneven hilly terrain putting pressure on the left ankle and the following day noticed that his left ankle became swollen and tender on the lateral aspect. The pain has persisted with weightbearing and is relieved with rest and elevation. The swelling has decreased, however, is still persistent. As a result of this tenderness in the lateral left ankle patient has difficulty with prolonged standing or walking. Patient has reduced activity and soaks ankle in Epsom salts.  Patient is diabetic and denies history of amputation, claudication or foot ulcerations  Review of Systems  Cardiovascular: Positive for leg swelling.  Musculoskeletal: Positive for joint swelling.       Objective:   Physical Exam  Orientated 3  Vascular: DP and PT pulses 2/4 bilaterally Capillary reflex immediate bilaterally Low-grade nonpitting edema lateral left ankle  Dermatological: No open skin lesions bilaterally Toenails 1-5 right and left are hypertrophic, discolored and deformed  Musculoskeletal: No crepitus on range of motion ankle bilaterally Mild discomfort in lateral left ankle when foot is inverted and plantarflexed Palpable tenderness anterior talofibular ligament left ankle area No tenderness to palpation on distal fibula or or distal tibia  X-Timmie weightbearing left ankle dated 10/12/2015  Intact bony structure without a fracture and/or dislocation Ankle mortise within normal limits Exostosis distal medial malleolus Dorsal exostosis on lateral view noted on the navicular area Inferior calcaneal spur No emphysema noted No gas formation noted Bone density appears adequate  Radiographic impression: No acute bony abnormality noted in left ankle      Assessment & Plan:   Assessment: Satisfactory neurovascular status Strain/sprain lateral left ankle  Plan: Today I review the results examination x-Irie with patient. I dispensed an ankle stabilizer were left ankle with instructions to continue daily wear this on weightbearing until symptoms resolve. Reduce activity to tolerance  Reappoint at patient's request

## 2015-10-12 NOTE — Patient Instructions (Signed)
Ankle Sprain  An ankle sprain is an injury to the strong, fibrous tissues (ligaments) that hold the bones of your ankle joint together.   CAUSES  An ankle sprain is usually caused by a fall or by twisting your ankle. Ankle sprains most commonly occur when you step on the outer edge of your foot, and your ankle turns inward. People who participate in sports are more prone to these types of injuries.   SYMPTOMS    Pain in your ankle. The pain may be present at rest or only when you are trying to stand or walk.   Swelling.   Bruising. Bruising may develop immediately or within 1 to 2 days after your injury.   Difficulty standing or walking, particularly when turning corners or changing directions.  DIAGNOSIS   Your caregiver will ask you details about your injury and perform a physical exam of your ankle to determine if you have an ankle sprain. During the physical exam, your caregiver will press on and apply pressure to specific areas of your foot and ankle. Your caregiver will try to move your ankle in certain ways. An X-Darnelle exam may be done to be sure a bone was not broken or a ligament did not separate from one of the bones in your ankle (avulsion fracture).   TREATMENT   Certain types of braces can help stabilize your ankle. Your caregiver can make a recommendation for this. Your caregiver may recommend the use of medicine for pain. If your sprain is severe, your caregiver may refer you to a surgeon who helps to restore function to parts of your skeletal system (orthopedist) or a physical therapist.  HOME CARE INSTRUCTIONS    Apply ice to your injury for 1-2 days or as directed by your caregiver. Applying ice helps to reduce inflammation and pain.    Put ice in a plastic bag.    Place a towel between your skin and the bag.    Leave the ice on for 15-20 minutes at a time, every 2 hours while you are awake.   Only take over-the-counter or prescription medicines for pain, discomfort, or fever as directed by  your caregiver.   Elevate your injured ankle above the level of your heart as much as possible for 2-3 days.   If your caregiver recommends crutches, use them as instructed. Gradually put weight on the affected ankle. Continue to use crutches or a cane until you can walk without feeling pain in your ankle.   If you have a plaster splint, wear the splint as directed by your caregiver. Do not rest it on anything harder than a pillow for the first 24 hours. Do not put weight on it. Do not get it wet. You may take it off to take a shower or bath.   You may have been given an elastic bandage to wear around your ankle to provide support. If the elastic bandage is too tight (you have numbness or tingling in your foot or your foot becomes cold and blue), adjust the bandage to make it comfortable.   If you have an air splint, you may blow more air into it or let air out to make it more comfortable. You may take your splint off at night and before taking a shower or bath. Wiggle your toes in the splint several times per day to decrease swelling.  SEEK MEDICAL CARE IF:    You have rapidly increasing bruising or swelling.   Your toes feel   extremely cold or you lose feeling in your foot.   Your pain is not relieved with medicine.  SEEK IMMEDIATE MEDICAL CARE IF:   Your toes are numb or blue.   You have severe pain that is increasing.  MAKE SURE YOU:    Understand these instructions.   Will watch your condition.   Will get help right away if you are not doing well or get worse.     This information is not intended to replace advice given to you by your health care provider. Make sure you discuss any questions you have with your health care provider.     Document Released: 09/03/2005 Document Revised: 09/24/2014 Document Reviewed: 09/15/2011  Elsevier Interactive Patient Education 2016 [employer]

## 2016-01-03 ENCOUNTER — Other Ambulatory Visit: Payer: Self-pay | Admitting: Internal Medicine

## 2016-01-05 ENCOUNTER — Ambulatory Visit: Payer: Medicare Other | Admitting: Internal Medicine

## 2016-01-12 ENCOUNTER — Encounter: Payer: Self-pay | Admitting: Internal Medicine

## 2016-01-12 ENCOUNTER — Other Ambulatory Visit (INDEPENDENT_AMBULATORY_CARE_PROVIDER_SITE_OTHER): Payer: Medicare Other

## 2016-01-12 ENCOUNTER — Other Ambulatory Visit: Payer: Self-pay | Admitting: Internal Medicine

## 2016-01-12 ENCOUNTER — Ambulatory Visit (INDEPENDENT_AMBULATORY_CARE_PROVIDER_SITE_OTHER): Payer: Medicare Other | Admitting: Internal Medicine

## 2016-01-12 VITALS — BP 160/82 | HR 75 | Temp 98.3°F | Resp 20 | Wt 224.0 lb

## 2016-01-12 DIAGNOSIS — E119 Type 2 diabetes mellitus without complications: Secondary | ICD-10-CM | POA: Diagnosis not present

## 2016-01-12 DIAGNOSIS — I10 Essential (primary) hypertension: Secondary | ICD-10-CM | POA: Diagnosis not present

## 2016-01-12 DIAGNOSIS — H1010 Acute atopic conjunctivitis, unspecified eye: Secondary | ICD-10-CM | POA: Diagnosis not present

## 2016-01-12 DIAGNOSIS — E785 Hyperlipidemia, unspecified: Secondary | ICD-10-CM

## 2016-01-12 LAB — BASIC METABOLIC PANEL
BUN: 14 mg/dL (ref 6–23)
CO2: 30 mEq/L (ref 19–32)
CREATININE: 1 mg/dL (ref 0.40–1.50)
Calcium: 10.2 mg/dL (ref 8.4–10.5)
Chloride: 101 mEq/L (ref 96–112)
GFR: 92.92 mL/min (ref >60.00)
Glucose, Bld: 262 mg/dL — ABNORMAL HIGH (ref 70–99)
Potassium: 4.9 mEq/L (ref 3.5–5.1)
Sodium: 137 mEq/L (ref 135–145)

## 2016-01-12 LAB — LIPID PANEL
CHOL/HDL RATIO: 3
Cholesterol: 159 mg/dL (ref 0–200)
HDL: 51.7 mg/dL (ref >39.00)
LDL CALC: 82 mg/dL (ref 0–99)
NonHDL: 107.18
TRIGLYCERIDES: 126 mg/dL (ref 0.0–149.0)
VLDL: 25.2 mg/dL (ref 0.0–40.0)

## 2016-01-12 LAB — HEPATIC FUNCTION PANEL
ALBUMIN: 4.7 g/dL (ref 3.5–5.2)
ALT: 25 U/L (ref 0–53)
AST: 20 U/L (ref 0–37)
Alkaline Phosphatase: 77 U/L (ref 39–117)
BILIRUBIN TOTAL: 0.7 mg/dL (ref 0.2–1.2)
Bilirubin, Direct: 0.1 mg/dL (ref 0.0–0.3)
Total Protein: 7.8 g/dL (ref 6.0–8.3)

## 2016-01-12 LAB — HEMOGLOBIN A1C: Hgb A1c MFr Bld: 9.3 % — ABNORMAL HIGH (ref 4.6–6.5)

## 2016-01-12 MED ORDER — OLMESARTAN MEDOXOMIL 40 MG PO TABS: ORAL_TABLET | ORAL | Status: DC

## 2016-01-12 MED ORDER — FLUTICASONE PROPIONATE 50 MCG/ACT NA SUSP: 2.0000 | Freq: Every day | NASAL | Status: DC

## 2016-01-12 MED ORDER — SITAGLIPTIN PHOSPHATE 100 MG PO TABS: ORAL_TABLET | ORAL | Status: DC

## 2016-01-12 MED ORDER — OLOPATADINE HCL 0.2 % OP SOLN: OPHTHALMIC | Status: DC

## 2016-01-12 MED ORDER — AMLODIPINE BESYLATE 10 MG PO TABS: ORAL_TABLET | ORAL | Status: DC

## 2016-01-12 MED ORDER — GLIMEPIRIDE 4 MG PO TABS: ORAL_TABLET | ORAL | Status: DC

## 2016-01-12 MED ORDER — ATORVASTATIN CALCIUM 20 MG PO TABS: ORAL_TABLET | ORAL | Status: DC

## 2016-01-12 MED ORDER — METFORMIN HCL ER 500 MG PO TB24
ORAL_TABLET | ORAL | Status: DC
Start: 2016-01-12 — End: 2016-02-06

## 2016-01-12 MED ORDER — METFORMIN HCL ER 500 MG PO TB24: ORAL_TABLET | ORAL | Status: DC

## 2016-01-12 NOTE — Patient Instructions (Addendum)
Please take all new medication as prescribed - the pataday  Please continue all other medications as before, and refills have been done if requested.  Please have the pharmacy call with any other refills you may need.  Please continue your efforts at being more active, low cholesterol diet, and weight control.  You are otherwise up to date with prevention measures today.  Please keep your appointments with your specialists as you may have planned  Please go to the LAB in the Basement (turn left off the elevator) for the tests to be done today  You will be contacted by phone if any changes need to be made immediately.  Otherwise, you will receive a letter about your results with an explanation, but please check with MyChart first.  Please remember to sign up for MyChart if you have not done so, as this will be important to you in the future with finding out test results, communicating by private email, and scheduling acute appointments online when needed.  Please return in 6 months, or sooner if needed

## 2016-01-12 NOTE — Progress Notes (Signed)
Pre visit review using our clinic review tool, if applicable. No additional management support is needed unless otherwise documented below in the visit note. 

## 2016-01-12 NOTE — Progress Notes (Signed)
Subjective:    Patient ID: Brian [name], male    DOB: 07/24/1936, 80 y.o.   MRN: [id number]  HPI  Here to f/u; overall doing ok,  Pt denies chest pain, increasing sob or doe, wheezing, orthopnea, PND, increased LE swelling, palpitations, dizziness or syncope.  Pt denies new neurological symptoms such as new headache, or facial or extremity weakness or numbness.  Pt denies polydipsia, polyuria, or low sugar episode.   Pt denies new neurological symptoms such as new headache, or facial or extremity weakness or numbness.   Pt states overall good compliance with meds, mostly trying to follow appropriate diet, with wt overall stable,  but little exercise however. Unofrtunately has been confused about meds, only taking metformin instead of 3 about 1 mo ago, and only half of the amaryl 4 mg twice per day.  States BP at home < 140/90 - ? White coat element Wt Readings from Last 3 Encounters:  01/12/16 224 lb (101.606 kg)  07/07/15 227 lb (102.967 kg)  12/30/14 228 lb (103.42 kg)  last seen per urology per Dr Risa Grill, for f/u at 3 yr, doing well.   Does have some mild eye allergy symtpoms with itch and slight d/c for several wks, asks for refill pataday Past Medical History  Diagnosis Date  . Diabetes mellitus type II   . Hyperlipidemia   . Hypertension   . BPH (benign prostatic hypertrophy)   . Obesity   . Chronic anemia   . History of prostatitis   . ED (erectile dysfunction)   . Right eye trauma     hx as child  . Allergic rhinitis   . Nephrolithiasis 12/07/2010  . ALLERGIC RHINITIS 10/13/2007  . ANEMIA, CHRONIC DISEASE NEC 03/31/2007  . BENIGN PROSTATIC HYPERTROPHY 03/31/2007  . DIABETES MELLITUS, TYPE II 03/31/2007  . [place] DISEASE, LUMBAR 03/31/2007  . HYPERLIPIDEMIA 03/31/2007  . HYPERTENSION 03/31/2007  . POLYP, ANAL AND RECTAL 03/31/2007  . Chest pain     Stress echo, normal, December, 2012  . Ejection fraction     EF  normal, stress echo, December,  2012   Past Surgical History    Procedure Laterality Date  . Rectal polypectomy      Transanal excision 10/2005  . Lumbar disc surgery      1999  . Prostate biopsy      s/p  . Skin biopsy      s/p right upper back 2009- benign Dr. Tonia Brooms    reports that he has never smoked. He does not have any smokeless tobacco history on file. He reports that he drinks alcohol. He reports that he does not use illicit drugs. family history includes Colon polyps in his mother. No Known Allergies Current Outpatient Prescriptions on File Prior to Visit  Medication Sig Dispense Refill  . amLODipine (NORVASC) 10 MG tablet Take 1 tablet by mouth  daily 90 tablet 0  . aspirin 81 MG EC tablet Take 81 mg by mouth daily.      [name] atorvastatin (LIPITOR) 20 MG tablet Take 1 tablet by mouth  daily 90 tablet 0  . clobetasol cream (TEMOVATE) [id number]-123456789 % Apply 1 application topically 2 (two) times daily. 60 g 1  . fluticasone (FLONASE) 50 MCG/ACT nasal spray Place 2 sprays into the nose daily. 16 g 11  . glimepiride (AMARYL) 4 MG tablet Take 1 tablet by mouth two  times daily 180 tablet 0  . glucose blood (ACCU-CHEK COMFORT CURVE) test strip Use as directed once  daily to check blood sugar.  Diagnosis code 250.00 100 each 11  . JANUVIA 100 MG tablet Take 1 tablet by mouth  daily 90 tablet 0  . metFORMIN (GLUCOPHAGE-XR) 500 MG 24 hr tablet Take 3 tablets by mouth  every morning 270 tablet 0  . olmesartan (BENICAR) 40 MG tablet Take one-half tablet by  mouth two times daily 90 tablet 0   No current facility-administered medications on file prior to visit.     Review of Systems  Constitutional: Negative for unusual diaphoresis or night sweats HENT: Negative for ear swelling or discharge Eyes: Negative for worsening visual haziness  Respiratory: Negative for choking and stridor.   Gastrointestinal: Negative for distension or worsening eructation Genitourinary: Negative for retention or change in urine volume.  Musculoskeletal: Negative for other MSK  pain or swelling Skin: Negative for color change and worsening wound Neurological: Negative for tremors and numbness other than noted  Psychiatric/Behavioral: Negative for decreased concentration or agitation other than above       Objective:   Physical Exam BP 160/82 mmHg  Pulse 75  Temp(Src) 98.3 F (36.8 C) (Oral)  Resp 20  Wt 224 lb (101.606 kg)  SpO2 96% VS noted,  Constitutional: Pt appears in no apparent distress HENT: Head: NCAT.  Right Ear: External ear normal.  Left Ear: External ear normal.  Eyes: . Pupils are equal, round, and reactive to light. Conjunctivae with mild clear d/c and EOM are normal Neck: Normal range of motion. Neck supple.  Cardiovascular: Normal rate and regular rhythm.   Pulmonary/Chest: Effort normal and breath sounds without rales or wheezing.  Abd:  Soft, NT, ND, + BS Neurological: Pt is alert. Not confused , motor grossly intact Skin: Skin is warm. No rash, no LE edema Psychiatric: Pt behavior is normal. No agitation.     Assessment & Plan:

## 2016-01-15 NOTE — Assessment & Plan Note (Signed)
?   Overall control, declines med change, o/w stable overall by history and exam, recent data reviewed with pt, and pt to continue medical treatment as before,  to f/u any worsening symptoms or concerns BP Readings from Last 3 Encounters:  01/12/16 160/82  10/12/15 156/90  07/07/15 158/72

## 2016-01-15 NOTE — Assessment & Plan Note (Signed)
stable overall by history and exam, recent data reviewed with pt, and pt to continue medical treatment as before,  to f/u any worsening symptoms or concerns Lab Results  Component Value Date   LDLCALC 82 01/12/2016    

## 2016-01-15 NOTE — Assessment & Plan Note (Signed)
Mild to mod, for pataday asd prn,  to f/u any worsening symptoms or concerns

## 2016-01-15 NOTE — Assessment & Plan Note (Signed)
stable overall by history and exam, recent data reviewed with pt, and pt to continue medical treatment as before,  to f/u any worsening symptoms or concerns Lab Results  Component Value Date   HGBA1C 9.3* 01/12/2016

## 2016-02-06 ENCOUNTER — Telehealth: Payer: Self-pay | Admitting: *Deleted

## 2016-02-06 NOTE — Telephone Encounter (Signed)
Receive call pt states he received his metformin that was sent to pharmacy, but since he never took the metformin three times a day he rather do the three times a day , and then if A1c is still elevated when he come back for his f/u increase to four if need to. He stated he told MD while he was in the office that he never took the three times a day, and he will continue the TID until he co e back in Oct to be evaluated. Pt also states that he never gets his lab results, or when MD change medication he isn't aware. He is also requesting copy of his labs. Inform pt will update and mail to address on file...Johny Chess

## 2016-02-07 NOTE — Telephone Encounter (Signed)
Unfortunately this will not work,  The A1c is very high at over 9.(with the goal being less than 7)  Taking the 3 metformin will not be enough.  Please increase the metformin to 4 pills per day, and continue all other medications as prescribed

## 2016-02-10 NOTE — Telephone Encounter (Signed)
Called and spoke to patient, he said that he understood the concern but he was only going to take the medication 3 times a day instead of four. I spoke to him about the importance of taking it four times a day and he said that if he could stand doing it for 4 times a day then he would try it. He stated that he spoke to someone here and they told him that it was ok for him to do it only 3 times daily. I do not see the documentation for that. Patient said he would call back with his decision.

## 2016-04-02 ENCOUNTER — Telehealth: Payer: Self-pay | Admitting: Emergency Medicine

## 2016-04-02 NOTE — Telephone Encounter (Signed)
Pt called and needs test strips and Lancets for glucose meter. The meter is Accu check Avida Plus. Please give pt a call back thanks.

## 2016-04-03 ENCOUNTER — Telehealth: Payer: Self-pay

## 2016-04-03 MED ORDER — GLUCOSE BLOOD VI STRP: ORAL_STRIP | Status: DC

## 2016-04-03 MED ORDER — ACCU-CHEK SOFT TOUCH LANCETS MISC: Status: DC

## 2016-04-03 NOTE — Telephone Encounter (Signed)
Medication refill sent to pharmacy  

## 2016-04-03 NOTE — Telephone Encounter (Signed)
Medication refills sent to pharmacy 

## 2016-05-01 NOTE — Telephone Encounter (Signed)
Patient walked in to advise that he has had to change his glucose meter. He is currently using accu-check avida plus.   He states that he needs [place] to call in plus test strips and softclix lancets  He wants to get them locally sent to [facility] on [address]

## 2016-05-02 NOTE — Telephone Encounter (Signed)
To be more clear, the patient has changed to the accu-check avida plus. This is the new meter.

## 2016-05-02 NOTE — Telephone Encounter (Signed)
Please clarify pt needs to change the meter, since if he changes the meter, then the plus strips may not work with the new machine

## 2016-05-03 MED ORDER — GLUCOSE BLOOD VI STRP: ORAL_STRIP | 11 refills | Status: DC

## 2016-05-03 MED ORDER — ACCU-CHEK SOFTCLIX LANCETS MISC: 11 refills | Status: DC

## 2016-05-03 NOTE — Telephone Encounter (Addendum)
Test strips and Lancets updated to Accu-Chek Aviva and sent to specified pharmacy. Pt advised of same

## 2016-05-03 NOTE — Addendum Note (Signed)
Addended by: Lyman Bishop on: 05/03/2016 04:33 PM   Modules accepted: Orders

## 2016-05-17 ENCOUNTER — Encounter: Payer: Self-pay | Admitting: Internal Medicine

## 2016-05-17 DIAGNOSIS — E119 Type 2 diabetes mellitus without complications: Secondary | ICD-10-CM | POA: Diagnosis not present

## 2016-05-17 DIAGNOSIS — H40013 Open angle with borderline findings, low risk, bilateral: Secondary | ICD-10-CM | POA: Diagnosis not present

## 2016-05-17 DIAGNOSIS — Z961 Presence of intraocular lens: Secondary | ICD-10-CM | POA: Diagnosis not present

## 2016-05-17 DIAGNOSIS — H21551 Recession of chamber angle, right eye: Secondary | ICD-10-CM | POA: Diagnosis not present

## 2016-05-17 LAB — HM DIABETES EYE EXAM

## 2016-06-01 ENCOUNTER — Encounter: Payer: Self-pay | Admitting: Internal Medicine

## 2016-07-12 ENCOUNTER — Other Ambulatory Visit (INDEPENDENT_AMBULATORY_CARE_PROVIDER_SITE_OTHER): Payer: Medicare Other

## 2016-07-12 ENCOUNTER — Ambulatory Visit (INDEPENDENT_AMBULATORY_CARE_PROVIDER_SITE_OTHER): Payer: Medicare Other | Admitting: Internal Medicine

## 2016-07-12 ENCOUNTER — Encounter: Payer: Self-pay | Admitting: Internal Medicine

## 2016-07-12 VITALS — BP 184/82 | HR 66 | Temp 97.9°F | Ht 72.0 in | Wt 205.0 lb

## 2016-07-12 DIAGNOSIS — E785 Hyperlipidemia, unspecified: Secondary | ICD-10-CM

## 2016-07-12 DIAGNOSIS — E119 Type 2 diabetes mellitus without complications: Secondary | ICD-10-CM

## 2016-07-12 DIAGNOSIS — I10 Essential (primary) hypertension: Secondary | ICD-10-CM

## 2016-07-12 DIAGNOSIS — Z23 Encounter for immunization: Secondary | ICD-10-CM | POA: Diagnosis not present

## 2016-07-12 DIAGNOSIS — N320 Bladder-neck obstruction: Secondary | ICD-10-CM

## 2016-07-12 LAB — BASIC METABOLIC PANEL
BUN: 16 mg/dL (ref 6–23)
CO2: 29 mEq/L (ref 19–32)
CREATININE: 1.01 mg/dL (ref 0.40–1.50)
Calcium: 10.3 mg/dL (ref 8.4–10.5)
Chloride: 102 mEq/L (ref 96–112)
GFR: 91.74 mL/min (ref >60.00)
GLUCOSE: 180 mg/dL — AB (ref 70–99)
POTASSIUM: 4.5 meq/L (ref 3.5–5.1)
Sodium: 141 mEq/L (ref 135–145)

## 2016-07-12 LAB — CBC WITH DIFFERENTIAL/PLATELET
BASOS ABS: 0 10*3/uL (ref 0.0–0.1)
Basophils Relative: 1 % (ref 0.0–3.0)
EOS ABS: 0.1 10*3/uL (ref 0.0–0.7)
Eosinophils Relative: 2.7 % (ref 0.0–5.0)
HCT: 40.5 % (ref 39.0–52.0)
Hemoglobin: 13.3 g/dL (ref 13.0–17.0)
LYMPHS ABS: 1.2 10*3/uL (ref 0.7–4.0)
Lymphocytes Relative: 33.4 % (ref 12.0–46.0)
MCHC: 32.8 g/dL (ref 30.0–36.0)
MCV: 79.9 fl (ref 78.0–100.0)
MONOS PCT: 8.4 % (ref 3.0–12.0)
Monocytes Absolute: 0.3 10*3/uL (ref 0.1–1.0)
NEUTROS ABS: 2 10*3/uL (ref 1.4–7.7)
NEUTROS PCT: 54.5 % (ref 43.0–77.0)
PLATELETS: 234 10*3/uL (ref 150.0–400.0)
RBC: 5.07 Mil/uL (ref 4.22–5.81)
RDW: 14.1 % (ref 11.5–15.5)
WBC: 3.7 10*3/uL — ABNORMAL LOW (ref 4.0–10.5)

## 2016-07-12 LAB — MICROALBUMIN / CREATININE URINE RATIO
CREATININE, U: 35.3 mg/dL
Microalb Creat Ratio: 2 mg/g (ref 0.0–30.0)
Microalb, Ur: <0.7 mg/dL (ref 0.0–1.9)

## 2016-07-12 LAB — LIPID PANEL
CHOL/HDL RATIO: 4
CHOLESTEROL: 207 mg/dL — AB (ref 0–200)
HDL: 51 mg/dL (ref >39.00)
LDL CALC: 140 mg/dL — AB (ref 0–99)
NonHDL: 156
TRIGLYCERIDES: 82 mg/dL (ref 0.0–149.0)
VLDL: 16.4 mg/dL (ref 0.0–40.0)

## 2016-07-12 LAB — URINALYSIS, ROUTINE W REFLEX MICROSCOPIC
BILIRUBIN URINE: NEGATIVE
Ketones, ur: NEGATIVE
Leukocytes, UA: NEGATIVE
Nitrite: NEGATIVE
PH: 6 (ref 5.0–8.0)
SPECIFIC GRAVITY, URINE: 1.01 (ref 1.000–1.030)
TOTAL PROTEIN, URINE-UPE24: NEGATIVE
UROBILINOGEN UA: 0.2 (ref 0.0–1.0)
Urine Glucose: NEGATIVE
WBC, UA: NONE SEEN (ref 0–?)

## 2016-07-12 LAB — HEPATIC FUNCTION PANEL
ALK PHOS: 78 U/L (ref 39–117)
ALT: 18 U/L (ref 0–53)
AST: 20 U/L (ref 0–37)
Albumin: 4.8 g/dL (ref 3.5–5.2)
BILIRUBIN DIRECT: 0.1 mg/dL (ref 0.0–0.3)
TOTAL PROTEIN: 7.7 g/dL (ref 6.0–8.3)
Total Bilirubin: 0.7 mg/dL (ref 0.2–1.2)

## 2016-07-12 LAB — HEMOGLOBIN A1C: Hgb A1c MFr Bld: 7.9 % — ABNORMAL HIGH (ref 4.6–6.5)

## 2016-07-12 MED ORDER — ACCU-CHEK SOFTCLIX LANCETS MISC: 3 refills | Status: DC

## 2016-07-12 MED ORDER — GLUCOSE BLOOD VI STRP: ORAL_STRIP | 3 refills | Status: DC

## 2016-07-12 MED ORDER — AMLODIPINE-OLMESARTAN 10-40 MG PO TABS: 1.0000 | ORAL_TABLET | Freq: Every day | ORAL | 3 refills | Status: DC

## 2016-07-12 NOTE — Patient Instructions (Addendum)
You had the flu shot today  Ok to restart the amlodipine in the form of taking generic Azor (which is amlodipine and benicar in one pill)  OK to see if Onglyza or Tradjenta would be less expensive than the Januvia  Please continue all other medications as before, and refills have been done if requested - the lancets and strips  Please have the pharmacy call with any other refills you may need.  Please continue your efforts at being more active, low cholesterol diet, and weight control.  Please keep your appointments with your specialists as you may have planned  You will be contacted regarding the referral for: Diabetes education  Please go to the LAB in the Basement (turn left off the elevator) for the tests to be done today  You will be contacted by phone if any changes need to be made immediately.  Otherwise, you will receive a letter about your results with an explanation, but please check with MyChart first.  Please remember to sign up for MyChart if you have not done so, as this will be important to you in the future with finding out test results, communicating by private email, and scheduling acute appointments online when needed.  Please return in 6 months, or sooner if needed

## 2016-07-12 NOTE — Progress Notes (Signed)
Pre visit review using our clinic review tool, if applicable. No additional management support is needed unless otherwise documented below in the visit note. 

## 2016-07-12 NOTE — Progress Notes (Signed)
Subjective:    Patient ID: Brian [name], male    DOB: May 01, 1936, 80 y.o.   MRN: [id number]  HPI  Here to f/u; overall doing ok,  Pt denies chest pain, increasing sob or doe, wheezing, orthopnea, PND, increased LE swelling, palpitations, dizziness or syncope.  Pt denies new neurological symptoms such as new headache, or facial or extremity weakness or numbness.  Pt denies polydipsia, polyuria, or low sugar episode.   Pt denies new neurological symptoms such as new headache, or facial or extremity weakness or numbness.   Pt states overall good compliance with meds, mostly trying to follow appropriate diet, with wt overall stable, Did 1.5 mile walk today, has been walking more lately and going to gym.  , and lost significant wt, also with better diet.  Really working hard to try to off DM meds. CBG;s ave 130's to 140's in the AM Wt Readings from Last 3 Encounters:  07/12/16 205 lb (93 kg)  01/12/16 224 lb (101.6 kg)  07/07/15 227 lb (103 kg)  Drinking 8 glasses of fluid /day and urinates every 15 min as he is drinking more water to help with wt loss.  BP Readings from Last 3 Encounters:  07/12/16 (!) 184/82  01/12/16 (!) 160/82  10/12/15 (!) 156/90  Not sure why, but not taking the amlodipine, some confused about meds, needs refresher today on reason for his individual meds/counseled. Januvia costs $900 now that he is donut hole, not taken for about 1 mo Past Medical History:  Diagnosis Date  . Allergic rhinitis   . ALLERGIC RHINITIS 10/13/2007  . ANEMIA, CHRONIC DISEASE NEC 03/31/2007  . BENIGN PROSTATIC HYPERTROPHY 03/31/2007  . BPH (benign prostatic hypertrophy)   . Chest pain    Stress echo, normal, December, 2012  . Chronic anemia   . Diabetes mellitus type II   . DIABETES MELLITUS, TYPE II 03/31/2007  . [place] DISEASE, LUMBAR 03/31/2007  . ED (erectile dysfunction)   . Ejection fraction    EF  normal, stress echo, December,  2012  . History of prostatitis   . Hyperlipidemia   .  HYPERLIPIDEMIA 03/31/2007  . Hypertension   . HYPERTENSION 03/31/2007  . Nephrolithiasis 12/07/2010  . Obesity   . POLYP, ANAL AND RECTAL 03/31/2007  . Right eye trauma    hx as child   Past Surgical History:  Procedure Laterality Date  . [place] SURGERY     1999  . PROSTATE BIOPSY     s/p  . RECTAL POLYPECTOMY     Transanal excision 10/2005  . SKIN BIOPSY     s/p right upper back 2009- benign Dr. Tonia Brooms    reports that he has never smoked. He does not have any smokeless tobacco history on file. He reports that he drinks alcohol. He reports that he does not use drugs. family history includes Colon polyps in his mother. No Known Allergies Current Outpatient Prescriptions on File Prior to Visit  Medication Sig Dispense Refill  . aspirin 81 MG EC tablet Take 81 mg by mouth daily.      [name] atorvastatin (LIPITOR) 20 MG tablet Take 1 tablet by mouth  daily 90 tablet 3  . clobetasol cream (TEMOVATE) [id number] % Apply 1 application topically 2 (two) times daily. 60 g 1  . fluticasone (FLONASE) 50 MCG/ACT nasal spray Place 2 sprays into both nostrils daily. 16 g 11  . glimepiride (AMARYL) 4 MG tablet Take 1 tablet by mouth two  times daily 180  tablet 3  . metFORMIN (GLUCOPHAGE-XR) 500 MG 24 hr tablet Take 500 mg by mouth 3 (three) times daily.    . Olopatadine HCl (PATADAY) 0.2 % SOLN 1 drop each eye daily 7.5 mL 3  . sitaGLIPtin (JANUVIA) 100 MG tablet Take 1 tablet by mouth  daily 90 tablet 3   No current facility-administered medications on file prior to visit.    Review of Systems  Constitutional: Negative for unusual diaphoresis or night sweats HENT: Negative for ear swelling or discharge Eyes: Negative for worsening visual haziness  Respiratory: Negative for choking and stridor.   Gastrointestinal: Negative for distension or worsening eructation Genitourinary: Negative for retention or change in urine volume.  Musculoskeletal: Negative for other MSK pain or swelling Skin: Negative  for color change and worsening wound Neurological: Negative for tremors and numbness other than noted  Psychiatric/Behavioral: Negative for decreased concentration or agitation other than above   All o/w system meg per pt    Objective:   Physical Exam BP (!) 184/82   Pulse 66   Temp 97.9 F (36.6 C) (Oral)   Ht 6' (1.829 m)   Wt 205 lb (93 kg)   SpO2 97%   BMI 27.80 kg/m  VS noted,  Constitutional: Pt appears in no apparent distress HENT: Head: NCAT.  Right Ear: External ear normal.  Left Ear: External ear normal.  Eyes: . Pupils are equal, round, and reactive to light. Conjunctivae and EOM are normal Neck: Normal range of motion. Neck supple.  Cardiovascular: Normal rate and regular rhythm.   Pulmonary/Chest: Effort normal and breath sounds without rales or wheezing.  Neurological: Pt is alert. Not confused , motor grossly intact Skin: Skin is warm. No rash, no LE edema Psychiatric: Pt behavior is normal. No agitation.  No other signficant exam abnormal    Assessment & Plan:

## 2016-07-14 NOTE — Assessment & Plan Note (Signed)
stable overall by history and exam, recent data reviewed with pt, and pt to continue medical treatment as before,  to f/u any worsening symptoms or concerns BP Readings from Last 3 Encounters:  07/12/16 (!) 184/82  01/12/16 (!) 160/82  10/12/15 (!) 156/90   To restart amlod as comb med with benicar - azor,  to f/u any worsening symptoms or concerns

## 2016-07-14 NOTE — Assessment & Plan Note (Signed)
Uncontrolled, refer DM education, stable overall by history and exam, recent data reviewed with pt, and pt to continue medical treatment as before,  to f/u any worsening symptoms or concerns Lab Results  Component Value Date   HGBA1C 9.3 (H) 01/12/2016   For  januvia start,  to f/u any worsening symptoms or concerns

## 2016-07-14 NOTE — Assessment & Plan Note (Signed)
stable overall by history and exam, recent data reviewed with pt, and pt to continue medical treatment as before,  to f/u any worsening symptoms or concerns Lab Results  Component Value Date   LDLCALC 82 01/12/2016

## 2016-07-16 LAB — PSA: PSA: 2.58 ng/mL (ref 0.10–4.00)

## 2016-07-16 LAB — TSH: TSH: 1.28 u[IU]/mL (ref 0.35–4.50)

## 2016-07-17 ENCOUNTER — Encounter: Payer: Self-pay | Admitting: Internal Medicine

## 2016-08-24 ENCOUNTER — Ambulatory Visit: Payer: Medicare Other | Admitting: Internal Medicine

## 2016-10-01 DIAGNOSIS — N5201 Erectile dysfunction due to arterial insufficiency: Secondary | ICD-10-CM | POA: Diagnosis not present

## 2016-10-01 DIAGNOSIS — N400 Enlarged prostate without lower urinary tract symptoms: Secondary | ICD-10-CM | POA: Diagnosis not present

## 2016-10-02 ENCOUNTER — Ambulatory Visit: Payer: Medicare Other | Admitting: Internal Medicine

## 2016-10-02 ENCOUNTER — Encounter: Payer: Self-pay | Admitting: Internal Medicine

## 2016-10-02 ENCOUNTER — Ambulatory Visit (INDEPENDENT_AMBULATORY_CARE_PROVIDER_SITE_OTHER): Payer: Medicare Other | Admitting: Internal Medicine

## 2016-10-02 VITALS — BP 150/90 | HR 90 | Ht 70.5 in | Wt 205.0 lb

## 2016-10-02 DIAGNOSIS — N521 Erectile dysfunction due to diseases classified elsewhere: Secondary | ICD-10-CM | POA: Diagnosis not present

## 2016-10-02 DIAGNOSIS — E1165 Type 2 diabetes mellitus with hyperglycemia: Secondary | ICD-10-CM

## 2016-10-02 DIAGNOSIS — E1159 Type 2 diabetes mellitus with other circulatory complications: Secondary | ICD-10-CM

## 2016-10-02 DIAGNOSIS — IMO0002 Reserved for concepts with insufficient information to code with codable children: Secondary | ICD-10-CM

## 2016-10-02 LAB — POCT GLYCOSYLATED HEMOGLOBIN (HGB A1C): HEMOGLOBIN A1C: 9.6

## 2016-10-02 MED ORDER — OLMESARTAN MEDOXOMIL 40 MG PO TABS: ORAL_TABLET | ORAL | 1 refills | Status: DC

## 2016-10-02 MED ORDER — METFORMIN HCL ER 500 MG PO TB24: 1000.0000 mg | ORAL_TABLET | Freq: Every day | ORAL | 3 refills | Status: DC

## 2016-10-02 NOTE — Progress Notes (Signed)
Patient ID: Brian [name], male   DOB: [birth year], 81 y.o.   MRN: [id number]   HPI: Brian [name] is a 81 y.o.-year-old male, referred by his PCP, Dr. Jenny Reichmann, for management of DM2, dx in early 1990s, non-insulin-dependent, uncontrolled, with complications (ED).  Last hemoglobin A1c was: Lab Results  Component Value Date   HGBA1C 7.9 (H) 07/12/2016   HGBA1C 9.3 (H) 01/12/2016   HGBA1C 7.7 (H) 07/07/2015   Last HbA1c was checked after stopping all meds (!) since 03/2016. Since then he restarted some of his meds back, but not the DM meds.  He was on a regimen of: - Metformin ER 500 mg 2x a day, with meals - Amaryl 4 mg 2x a day before meals - Januvia 100 mg in am - added 12/2015 He would not want to restart meds.   Pt was checking his sugars 2x a day but not recently as he could not get strips - lately checked occasionally,1x a day. Worse after the Holidays. - am: 190-210 - 2h after b'fast: n/c - before lunch: n/c - 2h after lunch: n/c - before dinner: n/c - 2h after dinner: n/c - bedtime: n/c - nighttime: n/c No lows. Lowest sugar was 130; he has hypoglycemia awareness at 70.  Highest sugar was 220.  Glucometer: Accu-Chek Aviva  Pt's meals are: - Breakfast: 1-2 eggs, bread, coffee; old-fashion oatmeal 2-3x a week; smoothie 1x a week - Lunch:  Salad + Chicken 2-3x a week; PB sandwich with banana; occas. [place] sandwich - Dinner: home cooked: meat + veggies + starch - Snacks: apples, bananas, peanuts  He goes to the gym 3x  A week, 10 am-12 pm. The rest of the days: he walks.  He lost 20 pounds after stopping sodas and starting exercise consistently.  - + milk CKD, last BUN/creatinine:  Lab Results  Component Value Date   BUN 16 07/12/2016   BUN 14 01/12/2016   CREATININE 1.01 07/12/2016   CREATININE 1.00 01/12/2016  On Benicar. - last set of lipids: Lab Results  Component Value Date   CHOL 207 (H) 07/12/2016   HDL 51.00 07/12/2016   LDLCALC 140 (H) 07/12/2016   TRIG  82.0 07/12/2016   CHOLHDL 4 07/12/2016  On Lipitor. - last eye exam was in 03/2016. No DR.  - no numbness and tingling in his feet.  Pt has FH of DM in father, brother.  ROS: Constitutional: no weight gain/loss, no fatigue, no subjective hyperthermia/hypothermia + nocturia Eyes: no blurry vision, no xerophthalmia ENT: no sore throat, no nodules palpated in throat, no dysphagia/odynophagia, no hoarseness Cardiovascular: no CP/SOB/palpitations/leg swelling Respiratory: no cough/SOB Gastrointestinal: no N/V/D/C Musculoskeletal: no muscle/joint aches Skin: no rashes, + hair loss Neurological: no tremors/numbness/tingling/dizziness Psychiatric: no depression/anxiety + Problems with erections  Past Medical History:  Diagnosis Date  . Allergic rhinitis   . ALLERGIC RHINITIS 10/13/2007  . ANEMIA, CHRONIC DISEASE NEC 03/31/2007  . BENIGN PROSTATIC HYPERTROPHY 03/31/2007  . BPH (benign prostatic hypertrophy)   . Chest pain    Stress echo, normal, December, 2012  . Chronic anemia   . Diabetes mellitus type II   . DIABETES MELLITUS, TYPE II 03/31/2007  . [place] DISEASE, LUMBAR 03/31/2007  . ED (erectile dysfunction)   . Ejection fraction    EF  normal, stress echo, December,  2012  . History of prostatitis   . Hyperlipidemia   . HYPERLIPIDEMIA 03/31/2007  . Hypertension   . HYPERTENSION 03/31/2007  . Nephrolithiasis 12/07/2010  . Obesity   .  POLYP, ANAL AND RECTAL 03/31/2007  . Right eye trauma    hx as child   Past Surgical History:  Procedure Laterality Date  . [place] SURGERY     1999  . PROSTATE BIOPSY     s/p  . RECTAL POLYPECTOMY     Transanal excision 10/2005  . SKIN BIOPSY     s/p right upper back 2009- benign Dr. Tonia Brooms   Social History   Social History  . Marital status: Married    Spouse name: N/A  . Number of children: 2   Occupational History  . [employer] administrator     Retired 1999   Social History Main Topics  . Smoking status: Never Smoker  .  Smokeless tobacco: Not on file  . Alcohol use Yes  . Drug use: No   Current Outpatient Prescriptions on File Prior to Visit  Medication Sig Dispense Refill  . ACCU-CHEK SOFTCLIX LANCETS lancets Use as instructed twice per day Dx Code E11.9 200 each 3  . aspirin 81 MG EC tablet Take 81 mg by mouth daily.      [name] atorvastatin (LIPITOR) 20 MG tablet Take 1 tablet by mouth  daily 90 tablet 3  . glucose blood (ACCU-CHEK AVIVA) test strip Use as instructed twice per day Dx E11.9 200 each 3  . Olopatadine HCl (PATADAY) 0.2 % SOLN 1 drop each eye daily 7.5 mL 3  . clobetasol cream (TEMOVATE) [id number] % Apply 1 application topically 2 (two) times daily. (Patient not taking: Reported on 10/02/2016) 60 g 1  . fluticasone (FLONASE) 50 MCG/ACT nasal spray Place 2 sprays into both nostrils daily. (Patient not taking: Reported on 10/02/2016) 16 g 11   No current facility-administered medications on file prior to visit.    No Known Allergies Family History  Problem Relation Age of Onset  . Colon polyps Mother     PE: BP (!) 150/90 (BP Location: Left Arm, Patient Position: Sitting)   Pulse 90   Ht 5' 10.5" (1.791 m)   Wt 205 lb (93 kg)   SpO2 97%   BMI 29.00 kg/m  Wt Readings from Last 3 Encounters:  10/02/16 205 lb (93 kg)  07/12/16 205 lb (93 kg)  01/12/16 224 lb (101.6 kg)   Constitutional: overweight, in NAD Eyes: PERRLA, EOMI, no exophthalmos ENT: moist mucous membranes, no thyromegaly, no cervical lymphadenopathy Cardiovascular: RRR, No MRG Respiratory: CTA B Gastrointestinal: abdomen soft, NT, ND, BS+ Musculoskeletal: no deformities, strength intact in all 4 Skin: moist, warm, no rashes Neurological: no tremor with outstretched hands, DTR normal in all 4  ASSESSMENT: 1. DM2, non-insulin-dependent, uncontrolled, with complications - ED  PLAN:  1. Patient with long-standing, uncontrolled diabetes, Off all his diabetes medicines since he would like to manage his diabetes naturally. His  HbA1c today's higher than before, at 9.6%. I explained that it would be very unusual to be able to manage his diabetes without medicines since his sugars are so high. However, we discussed about the vegan diet as a possible diet that can reverse diabetes. I gave him references about this. For now, I did suggest to add metformin, half maximal dose, since this is unlikely to cause side effects (he was on this in the past and had no problems with nausea or diarrhea). He did have dizziness and palpitations while on his previous diabetic regimen, and I suspect that these were due to Amaryl. - I suggested to:  Patient Instructions  Please start Metformin ER 500 mg  with dinner. In 4 days, if you tolerate this well, increase to 1000 mg with dinner.  Please let me know if the sugars are consistently <80 or >200.  Please return in 1.5 months with your sugar log.   - Strongly advised him to start checking sugars at different times of the day - check once a day, but rotating checks - given sugar log and advised how to fill it and to bring it at next appt  - given foot care handout and explained the principles  - given instructions for hypoglycemia management "15-15 rule"  - advised for yearly eye exams >> He is up-to-date - Return to clinic in 1.5 mo with sugar log   Philemon Kingdom, MD PhD [facility] Endocrinology

## 2016-10-02 NOTE — Patient Instructions (Signed)
Please start Metformin ER 500 mg with dinner. In 4 days, if you tolerate this well, increase to 1000 mg with dinner.  Please let me know if the sugars are consistently <80 or >200.  Please return in 1.5 months with your sugar log.   PATIENT INSTRUCTIONS FOR TYPE 2 DIABETES:  **Please join MyChart!** - see attached instructions about how to join if you have not done so already.  DIET AND EXERCISE Diet and exercise is an important part of diabetic treatment.  We recommended aerobic exercise in the form of brisk walking (working between 40-60% of maximal aerobic capacity, similar to brisk walking) for 150 minutes per week (such as 30 minutes five days per week) along with 3 times per week performing 'resistance' training (using various gauge rubber tubes with handles) 5-10 exercises involving the major muscle groups (upper body, lower body and core) performing 10-15 repetitions (or near fatigue) each exercise. Start at half the above goal but build slowly to reach the above goals. If limited by weight, joint pain, or disability, we recommend daily walking in a swimming pool with water up to waist to reduce pressure from joints while allow for adequate exercise.    BLOOD GLUCOSES Monitoring your blood glucoses is important for continued management of your diabetes. Please check your blood glucoses 2-4 times a day: fasting, before meals and at bedtime (you can rotate these measurements - e.g. one day check before the 3 meals, the next day check before 2 of the meals and before bedtime, etc.).   HYPOGLYCEMIA (low blood sugar) Hypoglycemia is usually a reaction to not eating, exercising, or taking too much insulin/ other diabetes drugs.  Symptoms include tremors, sweating, hunger, confusion, headache, etc. Treat IMMEDIATELY with 15 grams of Carbs: . 4 glucose tablets .  cup regular juice/soda . 2 tablespoons raisins . 4 teaspoons sugar . 1 tablespoon honey Recheck blood glucose in 15 mins and  repeat above if still symptomatic/blood glucose <100.  RECOMMENDATIONS TO REDUCE YOUR RISK OF DIABETIC COMPLICATIONS: * Take your prescribed MEDICATION(S) * Follow a DIABETIC diet: Complex carbs, fiber rich foods, (monounsaturated and polyunsaturated) fats * AVOID saturated/trans fats, high fat foods, >2,300 mg salt per day. * EXERCISE at least 5 times a week for 30 minutes or preferably daily.  * DO NOT SMOKE OR DRINK more than 1 drink a day. * Check your FEET every day. Do not wear tightfitting shoes. Contact us if you develop an ulcer * See your EYE doctor once a year or more if needed * Get a FLU shot once a year * Get a PNEUMONIA vaccine once before and once after age 82 years  GOALS:  * Your Hemoglobin A1c of <7%  * fasting sugars need to be <130 * after meals sugars need to be <180 (2h after you start eating) * Your Systolic BP should be [id number] or lower  * Your Diastolic BP should be 80 or lower  * Your HDL (Good Cholesterol) should be 40 or higher  * Your LDL (Bad Cholesterol) should be 100 or lower. * Your Triglycerides should be 150 or lower  * Your Urine microalbumin (kidney function) should be <30 * Your Body Mass Index should be 25 or lower    Please consider the following ways to cut down carbs and fat and increase fiber and micronutrients in your diet: - substitute whole grain for white bread or pasta - substitute brown rice for white rice - substitute 90-calorie flat bread pieces for slices  of bread when possible - substitute sweet potatoes or yams for white potatoes - substitute humus for margarine - substitute tofu for cheese when possible - substitute almond or rice milk for regular milk (would not drink soy milk daily due to concern for soy estrogen influence on breast cancer risk) - substitute dark chocolate for other sweets when possible - substitute water - can add lemon or orange slices for taste - for diet sodas (artificial sweeteners will trick your body that  you can eat sweets without getting calories and will lead you to overeating and weight gain in the long run) - do not skip breakfast or other meals (this will slow down the metabolism and will result in more weight gain over time)  - can try smoothies made from fruit and almond/rice milk in am instead of regular breakfast - can also try old-fashioned (not instant) oatmeal made with almond/rice milk in am - order the dressing on the side when eating salad at a restaurant (pour less than half of the dressing on the salad) - eat as little meat as possible - can try juicing, but should not forget that juicing will get rid of the fiber, so would alternate with eating raw veg./fruits or drinking smoothies - use as little oil as possible, even when using olive oil - can dress a salad with a mix of balsamic vinegar and lemon juice, for e.g. - use agave nectar, stevia sugar, or regular sugar rather than artificial sweateners - steam or broil/roast veggies  - snack on veggies/fruit/nuts (unsalted, preferably) when possible, rather than processed foods - reduce or eliminate aspartame in diet (it is in diet sodas, chewing gum, etc) Read the labels!  Try to read Dr. Janene Harvey book: "Program for Reversing Diabetes" for other ideas for healthy eating.

## 2016-10-02 NOTE — Addendum Note (Signed)
Addended by: Caprice Beaver T on: 10/02/2016 02:37 PM   Modules accepted: Orders

## 2016-10-15 ENCOUNTER — Telehealth: Payer: Self-pay | Admitting: Internal Medicine

## 2016-10-15 ENCOUNTER — Other Ambulatory Visit: Payer: Self-pay

## 2016-10-15 MED ORDER — ACCU-CHEK SOFTCLIX LANCETS MISC: 3 refills | Status: DC

## 2016-10-15 MED ORDER — GLUCOSE BLOOD VI STRP: ORAL_STRIP | 3 refills | Status: DC

## 2016-10-15 NOTE — Telephone Encounter (Signed)
rx submitted.  

## 2016-10-15 NOTE — Telephone Encounter (Signed)
Pt needs [place] to call [facility] and refill the accucheck soft click lancets and aviva test strips

## 2016-11-22 DIAGNOSIS — H40013 Open angle with borderline findings, low risk, bilateral: Secondary | ICD-10-CM | POA: Diagnosis not present

## 2016-11-22 DIAGNOSIS — H1013 Acute atopic conjunctivitis, bilateral: Secondary | ICD-10-CM | POA: Diagnosis not present

## 2016-11-28 ENCOUNTER — Encounter: Payer: Self-pay | Admitting: Internal Medicine

## 2016-11-28 ENCOUNTER — Ambulatory Visit (INDEPENDENT_AMBULATORY_CARE_PROVIDER_SITE_OTHER): Payer: Medicare Other | Admitting: Internal Medicine

## 2016-11-28 ENCOUNTER — Other Ambulatory Visit: Payer: Self-pay

## 2016-11-28 VITALS — BP 144/88 | HR 70 | Wt 202.0 lb

## 2016-11-28 DIAGNOSIS — E119 Type 2 diabetes mellitus without complications: Secondary | ICD-10-CM | POA: Diagnosis not present

## 2016-11-28 MED ORDER — ACCU-CHEK SOFTCLIX LANCETS MISC: 3 refills | Status: DC

## 2016-11-28 MED ORDER — GLUCOSE BLOOD VI STRP: ORAL_STRIP | 3 refills | Status: DC

## 2016-11-28 NOTE — Progress Notes (Signed)
Patient ID: Brian [name], male   DOB: [birth year], 81 y.o.   MRN: [id number]   HPI: Brian [name] is a 81 y.o.-year-old male,returning for f/u for DM2, dx in early 1990s, non-insulin-dependent, uncontrolled, with complications (ED). Last visit 2 mo ago.  Since last visit, he is busy with his wife, who has cancer. He did not have time to go to the gym.   Last hemoglobin A1c was: Lab Results  Component Value Date   HGBA1C 9.6 10/02/2016   HGBA1C 7.9 (H) 07/12/2016   HGBA1C 9.3 (H) 01/12/2016   Last HbA1c was checked after stopping all meds (!) since 03/2016. Since then he restarted some of his meds back, but not the DM meds.  He was on a regimen of: - Metformin ER 500 mg 2x a day, with meals - Amaryl 4 mg 2x a day before meals - Januvia 100 mg in am - added 12/2015 He did have dizziness and palpitations while on his previous diabetic regimen, and I suspect that these were due to Amaryl.  At last visit, we restarted: - Metformin ER 1000 mg with dinner.  Pt was checking his sugars 1x a week: - am: 190-210 >> 180 > 150 > 130s - 2h after b'fast: n/c >> 134 - before lunch: n/c - 2h after lunch: n/c - before dinner: n/c - 2h after dinner: n/c - bedtime: n/c - nighttime: n/c No lows. Lowest sugar was 130; he has hypoglycemia awareness at 70.  Highest sugar was 220 >> 180s  Glucometer: Accu-Chek Aviva  Pt changed his diet: - Breakfast: 1-2 eggs, bread, coffee; old-fashion oatmeal 2-3x a week; smoothie 1x a week >> now: cheerios/bran flakes, oatmeal + Almond milk - Lunch:  Salad + Chicken 2-3x a week; PB sandwich with banana; occas. [place] sandwich >> now: salad + chicken, homemade veggie soups, veggie burger - Dinner: home cooked: meat + veggies + starch >> now: same as lunch +/- fish/chicken - Snacks: apples, bananas, peanuts  He was going to the gym 3x a week, 10 am-12 pm >> now stopped, but will restart. The rest of the days: he walks.   He lost 20 pounds in the past after  stopping sodas and starting exercise consistently.  - + mild CKD, last BUN/creatinine:  Lab Results  Component Value Date   BUN 16 07/12/2016   BUN 14 01/12/2016   CREATININE 1.01 07/12/2016   CREATININE 1.00 01/12/2016  On Benicar. - last set of lipids: Lab Results  Component Value Date   CHOL 207 (H) 07/12/2016   HDL 51.00 07/12/2016   LDLCALC 140 (H) 07/12/2016   TRIG 82.0 07/12/2016   CHOLHDL 4 07/12/2016  On Lipitor. - last eye exam was in 03/2016. No DR.  - no numbness and tingling in his feet.  ROS: Constitutional: + weight loss, no fatigue, no subjective hyperthermia/hypothermia  Eyes: no blurry vision, no xerophthalmia ENT: no sore throat, no nodules palpated in throat, no dysphagia/odynophagia, no hoarseness Cardiovascular: no CP/SOB/palpitations/leg swelling Respiratory: no cough/SOB Gastrointestinal: no N/V/D/C Musculoskeletal: no muscle/joint aches Skin: no rashes Neurological: no tremors/numbness/tingling/dizziness + Problems with erections  Past Medical History:  Diagnosis Date  . Allergic rhinitis   . ALLERGIC RHINITIS 10/13/2007  . ANEMIA, CHRONIC DISEASE NEC 03/31/2007  . BENIGN PROSTATIC HYPERTROPHY 03/31/2007  . BPH (benign prostatic hypertrophy)   . Chest pain    Stress echo, normal, December, 2012  . Chronic anemia   . Diabetes mellitus type II   . DIABETES MELLITUS, TYPE  II 03/31/2007  . [place] DISEASE, LUMBAR 03/31/2007  . ED (erectile dysfunction)   . Ejection fraction    EF  normal, stress echo, December,  2012  . History of prostatitis   . Hyperlipidemia   . HYPERLIPIDEMIA 03/31/2007  . Hypertension   . HYPERTENSION 03/31/2007  . Nephrolithiasis 12/07/2010  . Obesity   . POLYP, ANAL AND RECTAL 03/31/2007  . Right eye trauma    hx as child   Past Surgical History:  Procedure Laterality Date  . [place] SURGERY     1999  . PROSTATE BIOPSY     s/p  . RECTAL POLYPECTOMY     Transanal excision 10/2005  . SKIN BIOPSY     s/p right  upper back 2009- benign Dr. Tonia Brooms   Social History   Social History  . Marital status: Married    Spouse name: N/A  . Number of children: 2   Occupational History  . [employer] administrator     Retired 1999   Social History Main Topics  . Smoking status: Never Smoker  . Smokeless tobacco: Not on file  . Alcohol use Yes  . Drug use: No   Current Outpatient Prescriptions on File Prior to Visit  Medication Sig Dispense Refill  . ACCU-CHEK SOFTCLIX LANCETS lancets Use as instructed twice per day Dx Code E11.9 200 each 3  . aspirin 81 MG EC tablet Take 81 mg by mouth daily.      [name] atorvastatin (LIPITOR) 20 MG tablet Take 1 tablet by mouth  daily 90 tablet 3  . glucose blood (ACCU-CHEK AVIVA) test strip Use as instructed twice per day Dx E11.9 200 each 3  . olmesartan (BENICAR) 40 MG tablet Take 1/2 tablet 2x a day 90 tablet 1  . Olopatadine HCl (PATADAY) 0.2 % SOLN 1 drop each eye daily 7.5 mL 3  . clobetasol cream (TEMOVATE) 7.34 % Apply 1 application topically 2 (two) times daily. (Patient not taking: Reported on 10/02/2016) 60 g 1  . fluticasone (FLONASE) 50 MCG/ACT nasal spray Place 2 sprays into both nostrils daily. (Patient not taking: Reported on 10/02/2016) 16 g 11  . metFORMIN (GLUCOPHAGE-XR) 500 MG 24 hr tablet Take 2 tablets (1,000 mg total) by mouth daily with supper. (Patient not taking: Reported on 11/28/2016) 60 tablet 3   No current facility-administered medications on file prior to visit.    No Known Allergies Family History  Problem Relation Age of Onset  . Colon polyps Mother     PE: BP (!) 144/88 (BP Location: Left Arm, Patient Position: Sitting)   Pulse 70   Wt 202 lb (91.6 kg)   SpO2 98%   BMI 28.57 kg/m  Wt Readings from Last 3 Encounters:  11/28/16 202 lb (91.6 kg)  10/02/16 205 lb (93 kg)  07/12/16 205 lb (93 kg)   Constitutional: overweight, in NAD Eyes: PERRLA, EOMI, no exophthalmos ENT: moist mucous membranes, no thyromegaly, no cervical  lymphadenopathy Cardiovascular: RRR, No MRG Respiratory: CTA B Gastrointestinal: abdomen soft, NT, ND, BS+ Musculoskeletal: no deformities, strength intact in all 4 Skin: moist, warm, no rashes Neurological: no tremor with outstretched hands, DTR normal in all 4  ASSESSMENT: 1. DM2, non-insulin-dependent, uncontrolled, with complications - ED  PLAN:  1. Patient with long-standing, uncontrolled diabetes, prev. off all his diabetes medicines as he wanted to manage his diabetes naturally >> HbA1c increased to 9.6%. At last visit, we discussed about the vegan diet as a possible diet that can reverse diabetes.  I gave him references about this. We also added back metformin ER, half maximal dose. His diet is much better now and he lost 3 lbs. His sugars are also better, although we have very few CBGs since he was not able to get his strips from the pharmacy (paper Rx's given today).  - Will not change regimen for now but will have him back in 1.5 mo for a recheck. - I suggested to:  Patient Instructions  Please continue: - Metformin ER 1000 mg daily at suppertime  Check sugars once a day, rotating checks.  Please return in 1.5 months with your sugar log.   - continue checking sugars at different times of the day - check once a day, but rotating checks - advised for yearly eye exams >> He is up-to-date - Return to clinic in 1.5 mo with sugar log   Philemon Kingdom, MD PhD [facility] Endocrinology

## 2016-11-28 NOTE — Patient Instructions (Addendum)
Please continue: - Metformin ER 1000 mg daily at suppertime  Check sugars once a day, rotating checks.  Please return in 1.5 months with your sugar log.

## 2016-11-29 ENCOUNTER — Other Ambulatory Visit: Payer: Self-pay

## 2016-11-29 ENCOUNTER — Telehealth: Payer: Self-pay | Admitting: Internal Medicine

## 2016-11-29 MED ORDER — OLMESARTAN MEDOXOMIL 40 MG PO TABS: ORAL_TABLET | ORAL | 1 refills | Status: DC

## 2016-11-29 MED ORDER — ATORVASTATIN CALCIUM 20 MG PO TABS: ORAL_TABLET | ORAL | 3 refills | Status: DC

## 2016-11-29 MED ORDER — METFORMIN HCL ER 500 MG PO TB24: 1000.0000 mg | ORAL_TABLET | Freq: Every day | ORAL | 3 refills | Status: DC

## 2016-11-29 NOTE — Telephone Encounter (Signed)
Submitted

## 2016-11-29 NOTE — Telephone Encounter (Signed)
Patient would like the following refills:  1) Metformin 500mg  2) Olmesartan 4mg  3) Atorvastatin 20mg 

## 2017-01-10 ENCOUNTER — Ambulatory Visit (INDEPENDENT_AMBULATORY_CARE_PROVIDER_SITE_OTHER): Payer: Medicare Other | Admitting: Internal Medicine

## 2017-01-10 ENCOUNTER — Encounter: Payer: Self-pay | Admitting: Internal Medicine

## 2017-01-10 DIAGNOSIS — E785 Hyperlipidemia, unspecified: Secondary | ICD-10-CM

## 2017-01-10 DIAGNOSIS — E1165 Type 2 diabetes mellitus with hyperglycemia: Secondary | ICD-10-CM | POA: Diagnosis not present

## 2017-01-10 DIAGNOSIS — IMO0002 Reserved for concepts with insufficient information to code with codable children: Secondary | ICD-10-CM

## 2017-01-10 DIAGNOSIS — I10 Essential (primary) hypertension: Secondary | ICD-10-CM

## 2017-01-10 DIAGNOSIS — E1159 Type 2 diabetes mellitus with other circulatory complications: Secondary | ICD-10-CM | POA: Diagnosis not present

## 2017-01-10 DIAGNOSIS — N200 Calculus of kidney: Secondary | ICD-10-CM | POA: Diagnosis not present

## 2017-01-10 DIAGNOSIS — N521 Erectile dysfunction due to diseases classified elsewhere: Secondary | ICD-10-CM | POA: Diagnosis not present

## 2017-01-10 MED ORDER — ATORVASTATIN CALCIUM 20 MG PO TABS: ORAL_TABLET | ORAL | 3 refills | Status: DC

## 2017-01-10 NOTE — Assessment & Plan Note (Signed)
Lab Results  Component Value Date   HGBA1C 9.6 10/02/2016  recent uncontrolled, may be element of noncompliance, for f/u labs today

## 2017-01-10 NOTE — Progress Notes (Signed)
Pre visit review using our clinic review tool, if applicable. No additional management support is needed unless otherwise documented below in the visit note. 

## 2017-01-10 NOTE — Assessment & Plan Note (Signed)
With hx of microhematuria, asympt, declines referral back to urology for now

## 2017-01-10 NOTE — Assessment & Plan Note (Addendum)
Mod elevated, I suspect an element of reactive today but still likely persistently elevated, I suggested adding amlodipine but he declines, will cont to follow BP at home and next visit, and I suspect compliance with med for BP may be an issue as he is also not taking the statin

## 2017-01-10 NOTE — Patient Instructions (Signed)
Please continue all other medications as before, and refills have been done if requested.  Please have the pharmacy call with any other refills you may need.  Please continue your efforts at being more active, low cholesterol diet, and weight control.  You are otherwise up to date with prevention measures today.  Please keep your appointments with your specialists as you may have planned  Please return in 6 months, or sooner if needed 

## 2017-01-10 NOTE — Progress Notes (Signed)
Subjective:    Patient ID: Brian [name], male    DOB: Oct 22, 1935, 81 y.o.   MRN: [id number]  HPI  Here for yewarly f/u;  Overall doing ok;  Pt denies Chest pain, worsening SOB, DOE, wheezing, orthopnea, PND, worsening LE edema, dizziness or syncope, but has had some palpiation so stopped all of his med at one point and palp's resolved, later restarted all meds but 1, but not sure which one he does not take.  Pt denies neurological change such as new headache, facial or extremity weakness.  Pt denies polydipsia, polyuria, or low sugar symptoms. Pt states overall good compliance with treatment and medications, good tolerability, and has been trying to follow appropriate diet.  Pt denies worsening depressive symptoms, suicidal ideation or panic. No fever, night sweats, wt loss, loss of appetite, or other constitutional symptoms.  Pt states good ability with ADL's, has low fall risk, home safety reviewed and adequate, no other significant changes in hearing or vision, and only occasionally active with exercise. Last saw optho about 2 mo ago.  Some increased stress realted to wife cancer tx  Had some microhematuria on last labs but has seen Dr Risa Grill yearly/urology, has known hx of renal stones, does not want referral back today.  States BP at home usually < 140/90? BP Readings from Last 3 Encounters:  01/10/17 (!) 160/82  11/28/16 (!) 144/88  10/02/16 (!) 150/90   Wt Readings from Last 3 Encounters:  01/10/17 205 lb (93 kg)  11/28/16 202 lb (91.6 kg)  10/02/16 205 lb (93 kg)   Past Medical History:  Diagnosis Date  . Allergic rhinitis   . ALLERGIC RHINITIS 10/13/2007  . ANEMIA, CHRONIC DISEASE NEC 03/31/2007  . BENIGN PROSTATIC HYPERTROPHY 03/31/2007  . BPH (benign prostatic hypertrophy)   . Chest pain    Stress echo, normal, December, 2012  . Chronic anemia   . Diabetes mellitus type II   . DIABETES MELLITUS, TYPE II 03/31/2007  . [place] DISEASE, LUMBAR 03/31/2007  . ED (erectile dysfunction)     . Ejection fraction    EF  normal, stress echo, December,  2012  . History of prostatitis   . Hyperlipidemia   . HYPERLIPIDEMIA 03/31/2007  . Hypertension   . HYPERTENSION 03/31/2007  . Nephrolithiasis 12/07/2010  . Obesity   . POLYP, ANAL AND RECTAL 03/31/2007  . Right eye trauma    hx as child   Past Surgical History:  Procedure Laterality Date  . [place] SURGERY     1999  . PROSTATE BIOPSY     s/p  . RECTAL POLYPECTOMY     Transanal excision 10/2005  . SKIN BIOPSY     s/p right upper back 2009- benign Dr. Tonia Brooms    reports that he quit smoking about 38 years ago. He has never used smokeless tobacco. He reports that he drinks alcohol. He reports that he does not use drugs. family history includes Colon polyps in his mother. No Known Allergies Current Outpatient Prescriptions on File Prior to Visit  Medication Sig Dispense Refill  . ACCU-CHEK SOFTCLIX LANCETS lancets Use as instructed twice per day Dx Code E11.9 200 each 3  . aspirin 81 MG EC tablet Take 81 mg by mouth daily.      . clobetasol cream (TEMOVATE) 6.27 % Apply 1 application topically 2 (two) times daily. 60 g 1  . fluticasone (FLONASE) 50 MCG/ACT nasal spray Place 2 sprays into both nostrils daily. 16 g 11  . glucose  blood (ACCU-CHEK AVIVA) test strip Use as instructed twice per day Dx E11.9 200 each 3  . metFORMIN (GLUCOPHAGE-XR) 500 MG 24 hr tablet Take 2 tablets (1,000 mg total) by mouth daily with supper. 60 tablet 3  . olmesartan (BENICAR) 40 MG tablet Take 1/2 tablet 2x a day 90 tablet 1  . Olopatadine HCl (PATADAY) 0.2 % SOLN 1 drop each eye daily 7.5 mL 3   No current facility-administered medications on file prior to visit.    Review of Systems  Constitutional: Negative for other unusual diaphoresis or sweats HENT: Negative for ear discharge or swelling Eyes: Negative for other worsening visual disturbances Respiratory: Negative for stridor or other swelling  Gastrointestinal: Negative for  worsening distension or other blood Genitourinary: Negative for retention or other urinary change Musculoskeletal: Negative for other MSK pain or swelling Skin: Negative for color change or other new lesions Neurological: Negative for worsening tremors and other numbness  Psychiatric/Behavioral: Negative for worsening agitation or other fatigue All other system neg per pt    Objective:   Physical Exam BP (!) 160/82   Pulse 60   Ht 6' (1.829 m)   Wt 205 lb (93 kg)   SpO2 99%   BMI 27.80 kg/m  VS noted,  Constitutional: Pt appears in NAD HENT: Head: NCAT.  Right Ear: External ear normal.  Left Ear: External ear normal.  Eyes: . Pupils are equal, round, and reactive to light. Conjunctivae and EOM are normal Nose: without d/c or deformity Neck: Neck supple. Gross normal ROM Cardiovascular: Normal rate and regular rhythm.   Pulmonary/Chest: Effort normal and breath sounds without rales or wheezing.  Abd:  Soft, NT, ND, + BS, no organomegaly Neurological: Pt is alert. At baseline orientation, motor grossly intact Skin: Skin is warm. No rashes, other new lesions, no LE edema Psychiatric: Pt behavior is normal without agitation  No other exam findings    Assessment & Plan:

## 2017-01-10 NOTE — Assessment & Plan Note (Signed)
Lab Results  Component Value Date   LDLCALC 140 (H) 07/12/2016  pt admits to some med noncompliance, to restart the lipitor 20 qd

## 2017-01-31 ENCOUNTER — Ambulatory Visit: Payer: Medicare Other | Admitting: Internal Medicine

## 2017-04-30 ENCOUNTER — Other Ambulatory Visit: Payer: Self-pay | Admitting: Internal Medicine

## 2017-05-10 ENCOUNTER — Encounter: Payer: Self-pay | Admitting: Internal Medicine

## 2017-05-10 ENCOUNTER — Ambulatory Visit (INDEPENDENT_AMBULATORY_CARE_PROVIDER_SITE_OTHER): Payer: Medicare Other | Admitting: Internal Medicine

## 2017-05-10 VITALS — BP 150/80 | HR 64 | Ht 70.5 in | Wt 209.0 lb

## 2017-05-10 DIAGNOSIS — E785 Hyperlipidemia, unspecified: Secondary | ICD-10-CM | POA: Diagnosis not present

## 2017-05-10 DIAGNOSIS — N521 Erectile dysfunction due to diseases classified elsewhere: Secondary | ICD-10-CM

## 2017-05-10 DIAGNOSIS — E1159 Type 2 diabetes mellitus with other circulatory complications: Secondary | ICD-10-CM | POA: Diagnosis not present

## 2017-05-10 DIAGNOSIS — IMO0002 Reserved for concepts with insufficient information to code with codable children: Secondary | ICD-10-CM

## 2017-05-10 DIAGNOSIS — E1165 Type 2 diabetes mellitus with hyperglycemia: Secondary | ICD-10-CM

## 2017-05-10 LAB — POCT GLYCOSYLATED HEMOGLOBIN (HGB A1C): HEMOGLOBIN A1C: 8.8

## 2017-05-10 MED ORDER — SITAGLIPTIN PHOSPHATE 100 MG PO TABS: 100.0000 mg | ORAL_TABLET | Freq: Every day | ORAL | 11 refills | Status: DC

## 2017-05-10 NOTE — Progress Notes (Signed)
Patient ID: Brian [name], male   DOB: 11-Apr-1936, 81 y.o.   MRN: [id number]   HPI: Brian [name] is a 81 y.o.-year-old male,returning for f/u for DM2, dx in early 1990s, non-insulin-dependent, uncontrolled, with complications (ED). Last visit 5 mo ago.  He travelled a lot over the summer >> sugars higher.  He is still busy with his wife, who has cancer. She is feeling better.  Last hemoglobin A1c was: Lab Results  Component Value Date   HGBA1C 9.6 10/02/2016   HGBA1C 7.9 (H) 07/12/2016   HGBA1C 9.3 (H) 01/12/2016   Last HbA1c was checked after stopping all meds (!) since 03/2016.   He was on a regimen of: - Metformin ER 500 mg 2x a day, with meals - Amaryl 4 mg 2x a day before meals - Januvia 100 mg in am - added 12/2015 He did have dizziness and palpitations while on his previous diabetic regimen, and I suspect that these were due to Amaryl.  He is now on: - Metformin ER 1000 mg with dinner.  Pt was checking his sugars 1-2x a day: - am: 190-210 >> 180 > 150 > 130s >> 131-145 - 2h after b'fast: n/c >> 134 >> n/c - before lunch: n/c >> 190-220 - 2h after lunch: n/c - before dinner: n/c - 2h after dinner: n/c - bedtime: n/c - nighttime: n/c No lows. Lowest sugar was 130 >> 131; he has hypoglycemia awareness at 70.  Highest sugar was 220 >> 180s >> 220  Glucometer: Accu-Chek Aviva  Pt changed his diet before last visit >> now relaxed it. - Breakfast: 1-2 eggs, bread, coffee; old-fashion oatmeal 2-3x a week; smoothie 1x a week >> now: cheerios + banana, oatmeal + Almond milk + cantaloupe - Lunch:  Salad + Chicken 2-3x a week; PB sandwich with banana; occas. [place] sandwich >> now: salad + chicken, homemade veggie soups, veggie burger - Dinner: home cooked: meat + veggies + starch >> now: same as lunch +/- fish/chicken - Snacks: apples, bananas, peanuts  He was going to the gym 3x a week, 10 am-12 pm >> now only walks  He lost 20 pounds in the past after stopping sodas and  starting exercise consistently.Weight increased since last visit.  - + mild CKD, last BUN/creatinine:  Lab Results  Component Value Date   BUN 16 07/12/2016   BUN 14 01/12/2016   CREATININE 1.01 07/12/2016   CREATININE 1.00 01/12/2016  On Benicar - last set of lipids: Lab Results  Component Value Date   CHOL 207 (H) 07/12/2016   HDL 51.00 07/12/2016   LDLCALC 140 (H) 07/12/2016   TRIG 82.0 07/12/2016   CHOLHDL 4 07/12/2016  On Lipitor - restarted 12/2016 - last eye exam was in 03/2016 >> No DR - denies numbness and tingling in his feet.  ROS: Constitutional: no weight gain/no weight loss, no fatigue, no subjective hyperthermia, no subjective hypothermia Eyes: no blurry vision, no xerophthalmia ENT: no sore throat, no nodules palpated in throat, no dysphagia, no odynophagia, no hoarseness Cardiovascular: no CP/no SOB/no palpitations/no leg swelling Respiratory: no cough/no SOB/no wheezing Gastrointestinal: no N/no V/no D/no C/no acid reflux Musculoskeletal: no muscle aches/no joint aches Skin: no rashes, no hair loss Neurological: no tremors/no numbness/no tingling/no dizziness  I reviewed pt's medications, allergies, PMH, social hx, family hx, and changes were documented in the history of present illness. Otherwise, unchanged from my initial visit note.   Past Medical History:  Diagnosis Date  . Allergic rhinitis   .  ALLERGIC RHINITIS 10/13/2007  . ANEMIA, CHRONIC DISEASE NEC 03/31/2007  . BENIGN PROSTATIC HYPERTROPHY 03/31/2007  . BPH (benign prostatic hypertrophy)   . Chest pain    Stress echo, normal, December, 2012  . Chronic anemia   . Diabetes mellitus type II   . DIABETES MELLITUS, TYPE II 03/31/2007  . [place] DISEASE, LUMBAR 03/31/2007  . ED (erectile dysfunction)   . Ejection fraction    EF  normal, stress echo, December,  2012  . History of prostatitis   . Hyperlipidemia   . HYPERLIPIDEMIA 03/31/2007  . Hypertension   . HYPERTENSION 03/31/2007  .  Nephrolithiasis 12/07/2010  . Obesity   . POLYP, ANAL AND RECTAL 03/31/2007  . Right eye trauma    hx as child   Past Surgical History:  Procedure Laterality Date  . [place] SURGERY     1999  . PROSTATE BIOPSY     s/p  . RECTAL POLYPECTOMY     Transanal excision 10/2005  . SKIN BIOPSY     s/p right upper back 2009- benign Dr. Tonia Brooms   Social History   Social History  . Marital status: Married    Spouse name: N/A  . Number of children: 2   Occupational History  . [employer] administrator     Retired 1999   Social History Main Topics  . Smoking status: Never Smoker  . Smokeless tobacco: Not on file  . Alcohol use Yes  . Drug use: No   Current Outpatient Prescriptions on File Prior to Visit  Medication Sig Dispense Refill  . ACCU-CHEK SOFTCLIX LANCETS lancets Use as instructed twice per day Dx Code E11.9 200 each 3  . aspirin 81 MG EC tablet Take 81 mg by mouth daily.      [name] atorvastatin (LIPITOR) 20 MG tablet Take 1 tablet by mouth  daily 90 tablet 3  . clobetasol cream (TEMOVATE) 5.32 % Apply 1 application topically 2 (two) times daily. 60 g 1  . fluticasone (FLONASE) 50 MCG/ACT nasal spray Place 2 sprays into both nostrils daily. 16 g 11  . glucose blood (ACCU-CHEK AVIVA) test strip Use as instructed twice per day Dx E11.9 200 each 3  . metFORMIN (GLUCOPHAGE-XR) 500 MG 24 hr tablet TAKE 2 TABLETS (1,000 MG TOTAL) BY MOUTH DAILY WITH SUPPER. 60 tablet 3  . olmesartan (BENICAR) 40 MG tablet Take 1/2 tablet 2x a day 90 tablet 1  . Olopatadine HCl (PATADAY) 0.2 % SOLN 1 drop each eye daily 7.5 mL 3   No current facility-administered medications on file prior to visit.    No Known Allergies Family History  Problem Relation Age of Onset  . Colon polyps Mother     PE: BP (!) 150/80 (BP Location: Left Arm, Patient Position: Sitting)   Pulse 64   Ht 5' 10.5" (1.791 m)   Wt 209 lb (94.8 kg)   SpO2 98%   BMI 29.56 kg/m  Wt Readings from Last 3 Encounters:  05/10/17  209 lb (94.8 kg)  01/10/17 205 lb (93 kg)  11/28/16 202 lb (91.6 kg)   Constitutional: overweight, in NAD Eyes: PERRLA, EOMI, no exophthalmos ENT: moist mucous membranes, no thyromegaly, no cervical lymphadenopathy Cardiovascular: RRR, No MRG Respiratory: CTA B Gastrointestinal: abdomen soft, NT, ND, BS+ Musculoskeletal: no deformities, strength intact in all 4 Skin: moist, warm, no rashes Neurological: no tremor with outstretched hands, DTR normal in all 4   ASSESSMENT: 1. DM2, non-insulin-dependent, uncontrolled, with complications - ED  2. HL  PLAN:  1. Patient with long-standing, uncontrolled DM, prev. Off all his DM meds as he started to change his diet towards a more plant-based one. His sugars increased after he relaxed his diet >> had to restart Metformin. At this visit, we also need to add back Januvia as he needs help with postprandial sugars (now most sugars in the 200s before lunch). He agrees to restart. - I suggested to:  Patient Instructions  Please continue: - Metformin ER 1000 mg with dinner.  Please add: - Januvia 100 mg daily before b'fast  Please return in 3 months with your sugar log.  - today, HbA1c is 8.8% (better) - continue checking sugars at different times of the day - check 1x a day, rotating checks - advised for yearly eye exams >> he is due - Return to clinic in 3 mo with sugar log   2. HL - reviewed labs from 06/2016 >> LDL above goal - he restarted Lipitor 12/2016 >> has another appt with Dr Jenny Reichmann in 07/2017 for repeat levels - he is determined to go back to his plant-based diet >> this will help  Philemon Kingdom, MD PhD [facility] Endocrinology

## 2017-05-10 NOTE — Patient Instructions (Addendum)
Please continue: - Metformin ER 1000 mg with dinner.  Please add: - Januvia 100 mg daily before b'fast  Please return in 3 months with your sugar log.

## 2017-07-18 ENCOUNTER — Encounter: Payer: Self-pay | Admitting: Internal Medicine

## 2017-07-18 ENCOUNTER — Other Ambulatory Visit (INDEPENDENT_AMBULATORY_CARE_PROVIDER_SITE_OTHER): Payer: Medicare Other

## 2017-07-18 ENCOUNTER — Ambulatory Visit (INDEPENDENT_AMBULATORY_CARE_PROVIDER_SITE_OTHER): Payer: Medicare Other | Admitting: Internal Medicine

## 2017-07-18 VITALS — BP 156/90 | HR 70 | Temp 97.9°F | Ht 70.5 in | Wt 208.0 lb

## 2017-07-18 DIAGNOSIS — E1159 Type 2 diabetes mellitus with other circulatory complications: Secondary | ICD-10-CM | POA: Diagnosis not present

## 2017-07-18 DIAGNOSIS — E1165 Type 2 diabetes mellitus with hyperglycemia: Secondary | ICD-10-CM

## 2017-07-18 DIAGNOSIS — N521 Erectile dysfunction due to diseases classified elsewhere: Secondary | ICD-10-CM

## 2017-07-18 DIAGNOSIS — N320 Bladder-neck obstruction: Secondary | ICD-10-CM

## 2017-07-18 DIAGNOSIS — I10 Essential (primary) hypertension: Secondary | ICD-10-CM

## 2017-07-18 DIAGNOSIS — IMO0002 Reserved for concepts with insufficient information to code with codable children: Secondary | ICD-10-CM

## 2017-07-18 DIAGNOSIS — E785 Hyperlipidemia, unspecified: Secondary | ICD-10-CM

## 2017-07-18 DIAGNOSIS — Z23 Encounter for immunization: Secondary | ICD-10-CM

## 2017-07-18 LAB — BASIC METABOLIC PANEL
BUN: 14 mg/dL (ref 6–23)
CALCIUM: 10.1 mg/dL (ref 8.4–10.5)
CHLORIDE: 104 meq/L (ref 96–112)
CO2: 31 mEq/L (ref 19–32)
Creatinine, Ser: 1.02 mg/dL (ref 0.40–1.50)
GFR: 90.47 mL/min (ref >60.00)
GLUCOSE: 197 mg/dL — AB (ref 70–99)
Potassium: 4.8 mEq/L (ref 3.5–5.1)
Sodium: 141 mEq/L (ref 135–145)

## 2017-07-18 LAB — TSH: TSH: 1.62 u[IU]/mL (ref 0.35–4.50)

## 2017-07-18 LAB — HEPATIC FUNCTION PANEL
ALBUMIN: 4.5 g/dL (ref 3.5–5.2)
ALT: 12 U/L (ref 0–53)
AST: 17 U/L (ref 0–37)
Alkaline Phosphatase: 64 U/L (ref 39–117)
BILIRUBIN TOTAL: 0.6 mg/dL (ref 0.2–1.2)
Bilirubin, Direct: 0.1 mg/dL (ref 0.0–0.3)
Total Protein: 7.3 g/dL (ref 6.0–8.3)

## 2017-07-18 LAB — PSA: PSA: 2.64 ng/mL (ref 0.10–4.00)

## 2017-07-18 LAB — URINALYSIS, ROUTINE W REFLEX MICROSCOPIC
Bilirubin Urine: NEGATIVE
Ketones, ur: NEGATIVE
LEUKOCYTES UA: NEGATIVE
NITRITE: NEGATIVE
SPECIFIC GRAVITY, URINE: 1.025 (ref 1.000–1.030)
Total Protein, Urine: NEGATIVE
URINE GLUCOSE: 500 — AB
Urobilinogen, UA: 0.2 (ref 0.0–1.0)
pH: 6 (ref 5.0–8.0)

## 2017-07-18 LAB — CBC WITH DIFFERENTIAL/PLATELET
BASOS PCT: 2 % (ref 0.0–3.0)
Basophils Absolute: 0.1 10*3/uL (ref 0.0–0.1)
EOS PCT: 3.6 % (ref 0.0–5.0)
Eosinophils Absolute: 0.2 10*3/uL (ref 0.0–0.7)
HEMATOCRIT: 40.2 % (ref 39.0–52.0)
HEMOGLOBIN: 12.8 g/dL — AB (ref 13.0–17.0)
Lymphocytes Relative: 23.4 % (ref 12.0–46.0)
Lymphs Abs: 1.2 10*3/uL (ref 0.7–4.0)
MCHC: 31.7 g/dL (ref 30.0–36.0)
MCV: 83.8 fl (ref 78.0–100.0)
MONO ABS: 0.5 10*3/uL (ref 0.1–1.0)
MONOS PCT: 10.2 % (ref 3.0–12.0)
Neutro Abs: 3.1 10*3/uL (ref 1.4–7.7)
Neutrophils Relative %: 60.8 % (ref 43.0–77.0)
Platelets: 210 10*3/uL (ref 150.0–400.0)
RBC: 4.79 Mil/uL (ref 4.22–5.81)
RDW: 14.1 % (ref 11.5–15.5)
WBC: 5.1 10*3/uL (ref 4.0–10.5)

## 2017-07-18 LAB — LIPID PANEL
CHOL/HDL RATIO: 4
Cholesterol: 180 mg/dL (ref 0–200)
HDL: 46.9 mg/dL (ref >39.00)
LDL CALC: 116 mg/dL — AB (ref 0–99)
NonHDL: 133.44
TRIGLYCERIDES: 88 mg/dL (ref 0.0–149.0)
VLDL: 17.6 mg/dL (ref 0.0–40.0)

## 2017-07-18 LAB — MICROALBUMIN / CREATININE URINE RATIO
Creatinine,U: 160.9 mg/dL
Microalb Creat Ratio: 1.1 mg/g (ref 0.0–30.0)
Microalb, Ur: 1.7 mg/dL (ref 0.0–1.9)

## 2017-07-18 MED ORDER — OLMESARTAN MEDOXOMIL 20 MG PO TABS: 20.0000 mg | ORAL_TABLET | Freq: Every day | ORAL | 3 refills | Status: DC

## 2017-07-18 NOTE — Progress Notes (Signed)
Subjective:    Patient ID: Brian [name], male    DOB: June 09, 1936, 81 y.o.   MRN: [id number]  HPI  Here for yearly f/u;  Overall doing ok;  Pt denies Chest pain, worsening SOB, DOE, wheezing, orthopnea, PND, worsening LE edema, palpitations, dizziness or syncope.  Pt denies neurological change such as new headache, facial or extremity weakness.  Pt denies polydipsia, polyuria, or low sugar symptoms. Pt states overall good compliance with treatment and medications, good tolerability, and has been trying to follow appropriate diet.  Pt denies worsening depressive symptoms, suicidal ideation or panic. No fever, night sweats, wt loss, loss of appetite, or other constitutional symptoms.  Pt states good ability with ADL's, has low fall risk, home safety reviewed and adequate, no other significant changes in hearing or vision, and only occasionally active with exercise. Admits to poorer diet prior aug a1c 8.8, but has really been working on better diet.  Plans to see Dr Renne Crigler next month.   Wt Readings from Last 3 Encounters:  07/18/17 208 lb (94.3 kg)  05/10/17 209 lb (94.8 kg)  01/10/17 205 lb (93 kg)   Past Medical History:  Diagnosis Date  . Allergic rhinitis   . ALLERGIC RHINITIS 10/13/2007  . ANEMIA, CHRONIC DISEASE NEC 03/31/2007  . BENIGN PROSTATIC HYPERTROPHY 03/31/2007  . BPH (benign prostatic hypertrophy)   . Chest pain    Stress echo, normal, December, 2012  . Chronic anemia   . Diabetes mellitus type II   . DIABETES MELLITUS, TYPE II 03/31/2007  . [place] DISEASE, LUMBAR 03/31/2007  . ED (erectile dysfunction)   . Ejection fraction    EF  normal, stress echo, December,  2012  . History of prostatitis   . Hyperlipidemia   . HYPERLIPIDEMIA 03/31/2007  . Hypertension   . HYPERTENSION 03/31/2007  . Nephrolithiasis 12/07/2010  . Obesity   . POLYP, ANAL AND RECTAL 03/31/2007  . Right eye trauma    hx as child   Past Surgical History:  Procedure Laterality Date  . [place] SURGERY     1999  . PROSTATE BIOPSY     s/p  . RECTAL POLYPECTOMY     Transanal excision 10/2005  . SKIN BIOPSY     s/p right upper back 2009- benign Dr. Tonia Brooms    reports that he quit smoking about 38 years ago. He has never used smokeless tobacco. He reports that he drinks alcohol. He reports that he does not use drugs. family history includes Colon polyps in his mother. No Known Allergies Current Outpatient Prescriptions on File Prior to Visit  Medication Sig Dispense Refill  . ACCU-CHEK SOFTCLIX LANCETS lancets Use as instructed twice per day Dx Code E11.9 200 each 3  . aspirin 81 MG EC tablet Take 81 mg by mouth daily.      . fluticasone (FLONASE) 50 MCG/ACT nasal spray Place 2 sprays into both nostrils daily. 16 g 11  . glucose blood (ACCU-CHEK AVIVA) test strip Use as instructed twice per day Dx E11.9 200 each 3  . metFORMIN (GLUCOPHAGE-XR) 500 MG 24 hr tablet TAKE 2 TABLETS (1,000 MG TOTAL) BY MOUTH DAILY WITH SUPPER. 60 tablet 3  . Olopatadine HCl (PATADAY) 0.2 % SOLN 1 drop each eye daily 7.5 mL 3  . sitaGLIPtin (JANUVIA) 100 MG tablet Take 1 tablet (100 mg total) by mouth daily. 30 tablet 11   No current facility-administered medications on file prior to visit.    Review of Systems  Constitutional: Negative  for other unusual diaphoresis or sweats HENT: Negative for ear discharge or swelling Eyes: Negative for other worsening visual disturbances Respiratory: Negative for stridor or other swelling  Gastrointestinal: Negative for worsening distension or other blood Genitourinary: Negative for retention or other urinary change Musculoskeletal: Negative for other MSK pain or swelling Skin: Negative for color change or other new lesions Neurological: Negative for worsening tremors and other numbness  Psychiatric/Behavioral: Negative for worsening agitation or other fatigue All other system neg per pt    Objective:   Physical Exam BP (!) 156/90   Pulse 70   Temp 97.9 F (36.6 C)  (Oral)   Ht 5' 10.5" (1.791 m)   Wt 208 lb (94.3 kg)   SpO2 99%   BMI 29.42 kg/m  VS noted,  Constitutional: Pt appears in NAD HENT: Head: NCAT.  Right Ear: External ear normal.  Left Ear: External ear normal.  Eyes: . Pupils are equal, round, and reactive to light. Conjunctivae and EOM are normal Nose: without d/c or deformity Neck: Neck supple. Gross normal ROM Cardiovascular: Normal rate and regular rhythm.   Pulmonary/Chest: Effort normal and breath sounds without rales or wheezing.  Abd:  Soft, NT, ND, + BS, no organomegaly Neurological: Pt is alert. At baseline orientation, motor grossly intact Skin: Skin is warm. No rashes, other new lesions, no LE edema Psychiatric: Pt behavior is normal without agitation  No other exam findings Lab Results  Component Value Date   WBC 5.1 07/18/2017   HGB 12.8 (L) 07/18/2017   HCT 40.2 07/18/2017   PLT 210.0 07/18/2017   GLUCOSE 197 (H) 07/18/2017   CHOL 180 07/18/2017   TRIG 88.0 07/18/2017   HDL 46.90 07/18/2017   LDLCALC 116 (H) 07/18/2017   ALT 12 07/18/2017   AST 17 07/18/2017   NA 141 07/18/2017   K 4.8 07/18/2017   CL 104 07/18/2017   CREATININE 1.02 07/18/2017   BUN 14 07/18/2017   CO2 31 07/18/2017   TSH 1.62 07/18/2017   PSA 2.64 07/18/2017   HGBA1C 8.8 05/10/2017   MICROALBUR 1.7 07/18/2017      Assessment & Plan:

## 2017-07-18 NOTE — Patient Instructions (Signed)
Ok to restart the Benicar at 20 mg per day  Please call or return if the BP is still more than 140/90 at home after 1-2 weeks  Please continue all other medications as before, and refills have been done if requested.  Please have the pharmacy call with any other refills you may need.  Please continue your efforts at being more active, low cholesterol diet, and weight control.  You are otherwise up to date with prevention measures today.  Please keep your appointments with your specialists as you may have planned  Please go to the LAB in the Basement (turn left off the elevator) for the tests to be done today  You will be contacted by phone if any changes need to be made immediately.  Otherwise, you will receive a letter about your results with an explanation, but please check with MyChart first.  Please remember to sign up for MyChart if you have not done so, as this will be important to you in the future with finding out test results, communicating by private email, and scheduling acute appointments online when needed.  Please return in 6 months, or sooner if needed

## 2017-07-20 NOTE — Assessment & Plan Note (Signed)
Uncontrolled, not taking the benicar recently, ok to restart at 20 mg daily, o/w stable overall by history and exam, recent data reviewed with pt, and pt to continue medical treatment as before,  to f/u any worsening symptoms or concerns BP Readings from Last 3 Encounters:  07/18/17 (!) 156/90  05/10/17 (!) 150/80  01/10/17 (!) 160/82

## 2017-07-20 NOTE — Assessment & Plan Note (Signed)
stable overall by history and exam, recent data reviewed with pt, and pt to continue medical treatment as before,  to f/u any worsening symptoms or concerns Lab Results  Component Value Date   HGBA1C 8.8 05/10/2017

## 2017-07-20 NOTE — Assessment & Plan Note (Signed)
stable overall by history and exam, recent data reviewed with pt, and pt to continue medical treatment as before,  to f/u any worsening symptoms or concerns Lab Results  Component Value Date   LDLCALC 116 (H) 07/18/2017

## 2017-07-30 DIAGNOSIS — H40013 Open angle with borderline findings, low risk, bilateral: Secondary | ICD-10-CM | POA: Diagnosis not present

## 2017-07-30 DIAGNOSIS — E119 Type 2 diabetes mellitus without complications: Secondary | ICD-10-CM | POA: Diagnosis not present

## 2017-07-30 DIAGNOSIS — H35033 Hypertensive retinopathy, bilateral: Secondary | ICD-10-CM | POA: Diagnosis not present

## 2017-07-30 DIAGNOSIS — Z961 Presence of intraocular lens: Secondary | ICD-10-CM | POA: Diagnosis not present

## 2017-07-30 LAB — HM DIABETES EYE EXAM

## 2017-08-19 ENCOUNTER — Encounter: Payer: Self-pay | Admitting: Internal Medicine

## 2017-08-19 ENCOUNTER — Ambulatory Visit (INDEPENDENT_AMBULATORY_CARE_PROVIDER_SITE_OTHER): Payer: Medicare Other | Admitting: Internal Medicine

## 2017-08-19 VITALS — BP 152/90 | HR 89 | Wt 213.2 lb

## 2017-08-19 DIAGNOSIS — I10 Essential (primary) hypertension: Secondary | ICD-10-CM

## 2017-08-19 DIAGNOSIS — E785 Hyperlipidemia, unspecified: Secondary | ICD-10-CM | POA: Diagnosis not present

## 2017-08-19 DIAGNOSIS — E1165 Type 2 diabetes mellitus with hyperglycemia: Secondary | ICD-10-CM

## 2017-08-19 DIAGNOSIS — N521 Erectile dysfunction due to diseases classified elsewhere: Secondary | ICD-10-CM

## 2017-08-19 DIAGNOSIS — E1159 Type 2 diabetes mellitus with other circulatory complications: Secondary | ICD-10-CM

## 2017-08-19 DIAGNOSIS — IMO0002 Reserved for concepts with insufficient information to code with codable children: Secondary | ICD-10-CM

## 2017-08-19 LAB — POCT GLYCOSYLATED HEMOGLOBIN (HGB A1C): Hemoglobin A1C: 6.4

## 2017-08-19 NOTE — Patient Instructions (Addendum)
Please continue: - Metformin ER 1000 mg with dinner. - Januvia 100 mg before b'fast  Please return in 4 months with your sugar log.

## 2017-08-19 NOTE — Addendum Note (Signed)
Addended by: Drucilla Schmidt on: 08/19/2017 01:29 PM   Modules accepted: Orders

## 2017-08-19 NOTE — Progress Notes (Signed)
Patient ID: Brian [name], male   DOB: August 17, 1936, 81 y.o.   MRN: [id number]   HPI: Brian [name] is a 81 y.o.-year-old male,returning for f/u for DM2, dx in early 1990s, non-insulin-dependent, uncontrolled, with complications (ED). Last visit 3 months ago.  He traveled a lot over the summer and his sugars are higher.  Also, his wife has cancer so he also has lot of stress.  Last hemoglobin A1c was: Lab Results  Component Value Date   HGBA1C 8.8 05/10/2017   HGBA1C 9.6 10/02/2016   HGBA1C 7.9 (H) 07/12/2016   He is on: - Metformin ER 1000 mg with dinner - Januvia 100 mg before brunch He was on Amaryl >> dizziness, palpitations.  Pt was checking his sugars 2-3x a day - forgot log: - am: 190-210 >> 180 > 150 > 130s >> 131-145 >> 110s, 120 - 2h after b'fast: n/c >> 134 >> n/c - before lunch: n/c >> 190-220 >> 130s-140 - 2h after lunch: n/c - before dinner: n/c > 130s-140 - 2h after dinner: n/c - bedtime: n/c - nighttime: n/c Lowest sugar was 130 >> 131 >> 110; he has hypoglycemia awareness in the 70s.  Highest sugar was 220 >> 180s >> 220 >> 140s.  Glucometer: Accu-Chek Aviva  At last visit, he relaxed his diet.  He is trying to get back on his previous diet, especially since he gained some weight over the holidays. - Breakfast: cheerios + banana, oatmeal + Almond milk + cantaloupe - Lunch:  Salad + Chicken 2-3x a week; PB sandwich with banana; occas. [place] sandwich >> now: salad + chicken, homemade veggie soups, veggie burger - Dinner: home cooked: same as lunch +/- fish/chicken/turkey; veggie burgers - Snacks: apples, bananas, peanuts  He walks for exercise, in am.  He lost 20 pounds in the past after stopping sodas and starting exercise consistently.  - + mild CKD, last BUN/creatinine:  Lab Results  Component Value Date   BUN 14 07/18/2017   BUN 16 07/12/2016   CREATININE 1.02 07/18/2017   CREATININE 1.01 07/12/2016  On Benicar - + HL; last set of lipids: Lab  Results  Component Value Date   CHOL 180 07/18/2017   HDL 46.90 07/18/2017   LDLCALC 116 (H) 07/18/2017   TRIG 88.0 07/18/2017   CHOLHDL 4 07/18/2017  On Lipitor. - last eye exam was in 07/2017 >> No DR - No numbness and tingling in his feet.  ROS: Constitutional:  + weight gain/no weight loss, no fatigue, no subjective hyperthermia, no subjective hypothermia Eyes: no blurry vision, no xerophthalmia ENT: no sore throat, no nodules palpated in throat, no dysphagia, no odynophagia, no hoarseness Cardiovascular: no CP/no SOB/no palpitations/no leg swelling Respiratory: no cough/no SOB/no wheezing Gastrointestinal: no N/no V/no D/no C/no acid reflux Musculoskeletal: no muscle aches/no joint aches Skin: no rashes, no hair loss Neurological: no tremors/no numbness/no tingling/no dizziness  I reviewed pt's medications, allergies, PMH, social hx, family hx, and changes were documented in the history of present illness. Otherwise, unchanged from my initial visit note.   Past Medical History:  Diagnosis Date  . Allergic rhinitis   . ALLERGIC RHINITIS 10/13/2007  . ANEMIA, CHRONIC DISEASE NEC 03/31/2007  . BENIGN PROSTATIC HYPERTROPHY 03/31/2007  . BPH (benign prostatic hypertrophy)   . Chest pain    Stress echo, normal, December, 2012  . Chronic anemia   . Diabetes mellitus type II   . DIABETES MELLITUS, TYPE II 03/31/2007  . [place] DISEASE, LUMBAR 03/31/2007  .  ED (erectile dysfunction)   . Ejection fraction    EF  normal, stress echo, December,  2012  . History of prostatitis   . Hyperlipidemia   . HYPERLIPIDEMIA 03/31/2007  . Hypertension   . HYPERTENSION 03/31/2007  . Nephrolithiasis 12/07/2010  . Obesity   . POLYP, ANAL AND RECTAL 03/31/2007  . Right eye trauma    hx as child   Past Surgical History:  Procedure Laterality Date  . [place] SURGERY     1999  . PROSTATE BIOPSY     s/p  . RECTAL POLYPECTOMY     Transanal excision 10/2005  . SKIN BIOPSY     s/p right upper  back 2009- benign Dr. Tonia Brooms   Social History   Social History  . Marital status: Married    Spouse name: N/A  . Number of children: 2   Occupational History  . [employer] administrator     Retired 1999   Social History Main Topics  . Smoking status: Never Smoker  . Smokeless tobacco: Not on file  . Alcohol use Yes  . Drug use: No   Current Outpatient Medications on File Prior to Visit  Medication Sig Dispense Refill  . ACCU-CHEK SOFTCLIX LANCETS lancets Use as instructed twice per day Dx Code E11.9 200 each 3  . aspirin 81 MG EC tablet Take 81 mg by mouth daily.      . fluticasone (FLONASE) 50 MCG/ACT nasal spray Place 2 sprays into both nostrils daily. 16 g 11  . glucose blood (ACCU-CHEK AVIVA) test strip Use as instructed twice per day Dx E11.9 200 each 3  . metFORMIN (GLUCOPHAGE-XR) 500 MG 24 hr tablet TAKE 2 TABLETS (1,000 MG TOTAL) BY MOUTH DAILY WITH SUPPER. 60 tablet 3  . olmesartan (BENICAR) 20 MG tablet Take 1 tablet (20 mg total) by mouth daily. 90 tablet 3  . Olopatadine HCl (PATADAY) 0.2 % SOLN 1 drop each eye daily 7.5 mL 3  . sitaGLIPtin (JANUVIA) 100 MG tablet Take 1 tablet (100 mg total) by mouth daily. 30 tablet 11   No current facility-administered medications on file prior to visit.    No Known Allergies Family History  Problem Relation Age of Onset  . Colon polyps Mother     PE: BP (!) 152/90   Pulse 89   Wt 213 lb 3.2 oz (96.7 kg)   SpO2 97%   BMI 30.16 kg/m  Wt Readings from Last 3 Encounters:  08/19/17 213 lb 3.2 oz (96.7 kg)  07/18/17 208 lb (94.3 kg)  05/10/17 209 lb (94.8 kg)   Constitutional: overweight, in NAD Eyes: PERRLA, EOMI, no exophthalmos ENT: moist mucous membranes, no thyromegaly, no cervical lymphadenopathy Cardiovascular: RRR, No MRG Respiratory: CTA B Gastrointestinal: abdomen soft, NT, ND, BS+ Musculoskeletal: no deformities, strength intact in all 4 Skin: moist, warm, no rashes Neurological: no tremor with outstretched  hands, DTR normal in all 4  ASSESSMENT: 1. DM2, non-insulin-dependent, uncontrolled, with complications - ED  2. HL  3. HTN  PLAN:  1. Patient with long-standing, uncontrolled diabetes, previously off all of his diabetes medicines as he started to change his diet towards a more plant-based one.  However, he relaxed his diet afterwards so we restarted metformin, and at last visit, we added Januvia.  He tolerates Januvia well, without side effects.  His sugars improved significantly since last visit - As sugars are at or close to goal, no changes are needed in his medications. - I suggested to:  Patient Instructions  Please continue: - Metformin ER 1000 mg with dinner. - Januvia 100 mg before b'fast  Please return in 3 months with your sugar log.  - today, HbA1c is 6.4% (MUCH better) - continue checking sugars at different times of the day - check 1x a day, rotating checks - advised for yearly eye exams >> he is UTD - Return to clinic in 4 mo with sugar log   2. HL -Reviewed labs from 07/2017: LDL much better (decreased from 140-116) -Restarted Lipitor in 12/2016.  No side effects -At last visit, he was also telling me that he was determined to go back to his plant-based diet  3. HTN - blood pressure high today - he just started back on Benicar  Philemon Kingdom, MD PhD [facility]

## 2017-09-08 ENCOUNTER — Other Ambulatory Visit: Payer: Self-pay | Admitting: Internal Medicine

## 2017-12-03 ENCOUNTER — Ambulatory Visit: Payer: Medicare Other | Admitting: Internal Medicine

## 2017-12-03 ENCOUNTER — Ambulatory Visit (INDEPENDENT_AMBULATORY_CARE_PROVIDER_SITE_OTHER): Payer: Medicare HMO | Admitting: Family Medicine

## 2017-12-03 ENCOUNTER — Encounter: Payer: Self-pay | Admitting: Family Medicine

## 2017-12-03 VITALS — BP 160/98 | HR 103 | Temp 98.3°F | Wt 208.0 lb

## 2017-12-03 DIAGNOSIS — I10 Essential (primary) hypertension: Secondary | ICD-10-CM | POA: Diagnosis not present

## 2017-12-03 DIAGNOSIS — F4321 Adjustment disorder with depressed mood: Secondary | ICD-10-CM

## 2017-12-03 DIAGNOSIS — R69 Illness, unspecified: Secondary | ICD-10-CM | POA: Diagnosis not present

## 2017-12-03 DIAGNOSIS — M25561 Pain in right knee: Secondary | ICD-10-CM

## 2017-12-03 MED ORDER — NAPROXEN 500 MG PO TABS: 500.0000 mg | ORAL_TABLET | Freq: Two times a day (BID) | ORAL | 1 refills | Status: DC | PRN

## 2017-12-03 NOTE — Progress Notes (Signed)
Brian [name] - 82 y.o. male MRN [id number]  Date of birth: 1936/05/28  SUBJECTIVE:  Including CC & ROS.  Chief Complaint  Patient presents with  . Right knee pain    Brian [name] is a 82 y.o. male that is presenting with right knee pain. Ongoing for one week. Pain is located in the lateral aspect and radiates upward to his thigh. Denies certain movements that exacerbate the pain. Pain is constant ache. He notices the pain more when he is active. He has been wearing a knee brace and applying aspercreme.   Grief: his wife has recently passed away about three weeks ago. He has not been taking his medications for HTN. Denies SI or HI   HTN: has not been taking his medications. Denies any chest pain, SOB, or leg swelling.    Review of Systems  Constitutional: Negative for fever.  Respiratory: Negative for shortness of breath.   Cardiovascular: Negative for chest pain.  Gastrointestinal: Negative for abdominal pain.  Skin: Negative for color change.  Allergic/Immunologic: Negative for immunocompromised state.  Neurological: Negative for weakness.  Hematological: Negative for adenopathy.  Psychiatric/Behavioral: Negative for agitation and suicidal ideas.    HISTORY: Past Medical, Surgical, Social, and Family History Reviewed & Updated per EMR.   Pertinent Historical Findings include:  Past Medical History:  Diagnosis Date  . Allergic rhinitis   . ALLERGIC RHINITIS 10/13/2007  . ANEMIA, CHRONIC DISEASE NEC 03/31/2007  . BENIGN PROSTATIC HYPERTROPHY 03/31/2007  . BPH (benign prostatic hypertrophy)   . Chest pain    Stress echo, normal, December, 2012  . Chronic anemia   . Diabetes mellitus type II   . DIABETES MELLITUS, TYPE II 03/31/2007  . [place] DISEASE, LUMBAR 03/31/2007  . ED (erectile dysfunction)   . Ejection fraction    EF  normal, stress echo, December,  2012  . History of prostatitis   . Hyperlipidemia   . HYPERLIPIDEMIA 03/31/2007  . Hypertension   . HYPERTENSION  03/31/2007  . Nephrolithiasis 12/07/2010  . Obesity   . POLYP, ANAL AND RECTAL 03/31/2007  . Right eye trauma    hx as child    Past Surgical History:  Procedure Laterality Date  . [place] SURGERY     1999  . PROSTATE BIOPSY     s/p  . RECTAL POLYPECTOMY     Transanal excision 10/2005  . SKIN BIOPSY     s/p right upper back 2009- benign Dr. Tonia Brooms    No Known Allergies  Family History  Problem Relation Age of Onset  . Colon polyps Mother      Social History   Socioeconomic History  . Marital status: Married    Spouse name: Not on file  . Number of children: 2  . Years of education: Not on file  . Highest education level: Not on file  Social Needs  . Financial resource strain: Not on file  . Food insecurity - worry: Not on file  . Food insecurity - inability: Not on file  . Transportation needs - medical: Not on file  . Transportation needs - non-medical: Not on file  Occupational History  . Occupation: Acupuncturist    Comment: Retired 1999  Tobacco Use  . Smoking status: Former Smoker    Last attempt to quit: 11/29/1978    Years since quitting: 39.0  . Smokeless tobacco: Never Used  Substance and Sexual Activity  . Alcohol use: Yes  . Drug use: No  . Sexual  activity: Not on file  Other Topics Concern  . Not on file  Social History Narrative  . Not on file     PHYSICAL EXAM:  VS: BP (!) 160/98 (BP Location: Left Arm, Patient Position: Sitting, Cuff Size: Normal)   Pulse (!) 103   Temp 98.3 F (36.8 C) (Oral)   Wt 208 lb (94.3 kg)   SpO2 99%   BMI 29.42 kg/m  Physical Exam Gen: NAD, alert, cooperative with exam, well-appearing ENT: normal lips, normal nasal mucosa,  Eye: normal EOM, normal conjunctiva and lids CV:  no edema, +2 pedal pulses   Resp: no accessory muscle use, non-labored, S1S2 Skin: no rashes, no areas of induration  Neuro: normal tone, normal sensation to touch Psych:  normal insight, alert and oriented MSK:  Right  knee:  No obvious effusion  Normal flexion and extension  Normal strength to resistance  No instability  Normal gait  Neurovascularly intact   Limited ultrasound: Right knee:  No obvious effusion Normal joint spaces in the medial and lateral compartment Normal quad and patellar tendon  Summary: normal exam   Ultrasound and interpretation by Clearance Coots, MD          ASSESSMENT & PLAN:   Hypertension Uncontrolled. Has not been taking his blood pressure medications with the recent loss of his wife. - he will start taking his medications again  - he will follow up if blood pressure remains elevated.   Acute pain of right knee His pain seems to be more associated with PF syndrome and less structural  - naproxen  - counseled on supportive  - counseled on HEP  - counseled on ice and compression   Grief Has lost his wife about 3 weeks ago. He denies any SI or HI.  - counseled about loss - counseled about following up if he would like to discuss further.

## 2017-12-03 NOTE — Patient Instructions (Signed)
Please take the naproxen for 7 days straight and then as needed.  Please try ice and compression  Please follow up with me in 4-6 weeks if there is no improvement

## 2017-12-04 DIAGNOSIS — F4321 Adjustment disorder with depressed mood: Secondary | ICD-10-CM | POA: Insufficient documentation

## 2017-12-04 DIAGNOSIS — M79604 Pain in right leg: Secondary | ICD-10-CM | POA: Insufficient documentation

## 2017-12-04 NOTE — Assessment & Plan Note (Signed)
Has lost his wife about 3 weeks ago. He denies any SI or HI.  - counseled about loss - counseled about following up if he would like to discuss further.

## 2017-12-04 NOTE — Assessment & Plan Note (Addendum)
His pain seems to be more associated with PF syndrome and less structural  - naproxen  - counseled on supportive  - counseled on HEP  - counseled on ice and compression

## 2017-12-04 NOTE — Assessment & Plan Note (Signed)
Uncontrolled. Has not been taking his blood pressure medications with the recent loss of his wife. - he will start taking his medications again  - he will follow up if blood pressure remains elevated.

## 2017-12-16 ENCOUNTER — Ambulatory Visit: Payer: Self-pay

## 2017-12-16 ENCOUNTER — Ambulatory Visit (INDEPENDENT_AMBULATORY_CARE_PROVIDER_SITE_OTHER): Payer: Medicare HMO | Admitting: Family Medicine

## 2017-12-16 ENCOUNTER — Encounter: Payer: Self-pay | Admitting: Family Medicine

## 2017-12-16 VITALS — BP 162/104 | HR 67 | Temp 97.9°F | Ht 70.5 in | Wt 210.0 lb

## 2017-12-16 DIAGNOSIS — M25561 Pain in right knee: Secondary | ICD-10-CM

## 2017-12-16 DIAGNOSIS — I10 Essential (primary) hypertension: Secondary | ICD-10-CM | POA: Diagnosis not present

## 2017-12-16 MED ORDER — GABAPENTIN 100 MG PO CAPS: 100.0000 mg | ORAL_CAPSULE | Freq: Every day | ORAL | 1 refills | Status: DC

## 2017-12-16 MED ORDER — OLMESARTAN MEDOXOMIL 40 MG PO TABS: 40.0000 mg | ORAL_TABLET | Freq: Every day | ORAL | 3 refills | Status: DC

## 2017-12-16 NOTE — Assessment & Plan Note (Signed)
Uncontrolled.  - increased benicar.  - counseled on follow up

## 2017-12-16 NOTE — Progress Notes (Signed)
Brian [name] - 82 y.o. male MRN [id number]  Date of birth: 13-Mar-1936  SUBJECTIVE:  Including CC & ROS.  Chief Complaint  Patient presents with  . Follow-up    Brian [name] is a 82 y.o. male that is here today for right knee pain follow up. He states the pain has not improved. Pain feels like a stabbing pain. Worse when he is ambulating. Flexion and extension trigger mild to severe pain. Pain is located on the lateral aspect of his right knee and extends upward to his thigh. He has been taking Naproxen with no improvement.   HTN: he has been taking his medications as directed. No chest pain or SOB. Has not been sleeping well since the passing of wife.     Review of Systems  Constitutional: Negative for fever.  HENT: Negative for congestion.   Respiratory: Negative for shortness of breath.   Gastrointestinal: Negative for abdominal pain.  Musculoskeletal: Negative for gait problem.  Skin: Negative for color change.  Allergic/Immunologic: Negative for immunocompromised state.  Neurological: Negative for weakness.  Hematological: Negative for adenopathy.  Psychiatric/Behavioral: Negative for agitation.    HISTORY: Past Medical, Surgical, Social, and Family History Reviewed & Updated per EMR.   Pertinent Historical Findings include:  Past Medical History:  Diagnosis Date  . Allergic rhinitis   . ALLERGIC RHINITIS 10/13/2007  . ANEMIA, CHRONIC DISEASE NEC 03/31/2007  . BENIGN PROSTATIC HYPERTROPHY 03/31/2007  . BPH (benign prostatic hypertrophy)   . Chest pain    Stress echo, normal, December, 2012  . Chronic anemia   . Diabetes mellitus type II   . DIABETES MELLITUS, TYPE II 03/31/2007  . [place] DISEASE, LUMBAR 03/31/2007  . ED (erectile dysfunction)   . Ejection fraction    EF  normal, stress echo, December,  2012  . History of prostatitis   . Hyperlipidemia   . HYPERLIPIDEMIA 03/31/2007  . Hypertension   . HYPERTENSION 03/31/2007  . Nephrolithiasis 12/07/2010  . Obesity   .  POLYP, ANAL AND RECTAL 03/31/2007  . Right eye trauma    hx as child    Past Surgical History:  Procedure Laterality Date  . [place] SURGERY     1999  . PROSTATE BIOPSY     s/p  . RECTAL POLYPECTOMY     Transanal excision 10/2005  . SKIN BIOPSY     s/p right upper back 2009- benign Dr. Tonia Brooms    No Known Allergies  Family History  Problem Relation Age of Onset  . Colon polyps Mother      Social History   Socioeconomic History  . Marital status: Married    Spouse name: Not on file  . Number of children: 2  . Years of education: Not on file  . Highest education level: Not on file  Occupational History  . Occupation: Acupuncturist    Comment: Retired 1999  Social Needs  . Financial resource strain: Not on file  . Food insecurity:    Worry: Not on file    Inability: Not on file  . Transportation needs:    Medical: Not on file    Non-medical: Not on file  Tobacco Use  . Smoking status: Former Smoker    Last attempt to quit: 11/29/1978    Years since quitting: 39.0  . Smokeless tobacco: Never Used  Substance and Sexual Activity  . Alcohol use: Yes  . Drug use: No  . Sexual activity: Not on file  Lifestyle  .  Physical activity:    Days per week: Not on file    Minutes per session: Not on file  . Stress: Not on file  Relationships  . Social connections:    Talks on phone: Not on file    Gets together: Not on file    Attends religious service: Not on file    Active member of club or organization: Not on file    Attends meetings of clubs or organizations: Not on file    Relationship status: Not on file  . Intimate partner violence:    Fear of current or ex partner: Not on file    Emotionally abused: Not on file    Physically abused: Not on file    Forced sexual activity: Not on file  Other Topics Concern  . Not on file  Social History Narrative  . Not on file     PHYSICAL EXAM:  VS: BP (!) 162/104 (BP Location: Right Arm, Patient Position:  Sitting, Cuff Size: Normal)   Pulse 67   Temp 97.9 F (36.6 C) (Oral)   Ht 5' 10.5" (1.791 m)   Wt 210 lb (95.3 kg)   SpO2 97%   BMI 29.71 kg/m  Physical Exam Gen: NAD, alert, cooperative with exam, well-appearing ENT: normal lips, normal nasal mucosa,  Eye: normal EOM, normal conjunctiva and lids CV:  no edema, +2 pedal pulses   Resp: no accessory muscle use, non-labored,   Skin: no rashes, no areas of induration  Neuro: normal tone, normal sensation to touch Psych:  normal insight, alert and oriented MSK:  Right knee:  No effusion  Normal ROm  Normal strength to resistance  No instability  Negative Noble's test  Negative Thessaly test  Back/right hip: Normal IR and ER  No TTP of the GT Tight hamstring with leg extension Negative SLR b/l  Normal strength to resistance with hip flexion.  Neurovascularly intact.     Aspiration/Injection Procedure Note Brian [name] [birth year]  Procedure: Injection Indications: right knee pain    Procedure Details Consent: Risks of procedure as well as the alternatives and risks of each were explained to the (patient/caregiver).  Consent for procedure obtained. Time Out: Verified patient identification, verified procedure, site/side was marked, verified correct patient position, special equipment/implants available, medications/allergies/relevent history reviewed, required imaging and test results available.  Performed.  The area was cleaned with iodine and alcohol swabs.    The right knee SPP superiorlateral was injected using 1 cc's of 40 mg Depomedrol and 4 cc's of 1% lidocaine with a 22 1 1/2" needle.  Ultrasound was used. Images were obtained in Long views showing the injection.    A sterile dressing was applied.  Patient did tolerate procedure well.       ASSESSMENT & PLAN:   Hypertension Uncontrolled.  - increased benicar.  - counseled on follow up  Acute pain of right knee Unclear if his pain is from his knee or  more sciatic in nature. Doesn't appear to be from his hip joint. Doesn't appear to be IT band.  - injection today  - try gabapentin if his pain is more sciatic in nature.  - counseled on HEP  - if no improvement consider imaging on back or knee.

## 2017-12-16 NOTE — Assessment & Plan Note (Signed)
Unclear if his pain is from his knee or more sciatic in nature. Doesn't appear to be from his hip joint. Doesn't appear to be IT band.  - injection today  - try gabapentin if his pain is more sciatic in nature.  - counseled on HEP  - if no improvement consider imaging on back or knee.

## 2017-12-16 NOTE — Patient Instructions (Addendum)
Please try the gabapentin. This is a nerve pill and can help you sleep at night.  I have increased your blood pressure medication. Please try to take your blood pressure at least once a day.  Please follow up with me in 3 weeks if your pain hasn't improved.

## 2017-12-19 ENCOUNTER — Encounter: Payer: Self-pay | Admitting: Internal Medicine

## 2017-12-19 ENCOUNTER — Ambulatory Visit (INDEPENDENT_AMBULATORY_CARE_PROVIDER_SITE_OTHER): Payer: Medicare HMO | Admitting: Internal Medicine

## 2017-12-19 VITALS — BP 144/82 | HR 66 | Ht 70.5 in | Wt 199.0 lb

## 2017-12-19 DIAGNOSIS — E1165 Type 2 diabetes mellitus with hyperglycemia: Secondary | ICD-10-CM

## 2017-12-19 DIAGNOSIS — E785 Hyperlipidemia, unspecified: Secondary | ICD-10-CM | POA: Diagnosis not present

## 2017-12-19 DIAGNOSIS — E1159 Type 2 diabetes mellitus with other circulatory complications: Secondary | ICD-10-CM | POA: Diagnosis not present

## 2017-12-19 DIAGNOSIS — N521 Erectile dysfunction due to diseases classified elsewhere: Secondary | ICD-10-CM

## 2017-12-19 DIAGNOSIS — IMO0002 Reserved for concepts with insufficient information to code with codable children: Secondary | ICD-10-CM

## 2017-12-19 DIAGNOSIS — E663 Overweight: Secondary | ICD-10-CM

## 2017-12-19 LAB — POCT GLYCOSYLATED HEMOGLOBIN (HGB A1C): HEMOGLOBIN A1C: 6.4

## 2017-12-19 MED ORDER — GLUCOSE BLOOD VI STRP: ORAL_STRIP | 3 refills | Status: DC

## 2017-12-19 MED ORDER — METFORMIN HCL ER 500 MG PO TB24: 1000.0000 mg | ORAL_TABLET | Freq: Every day | ORAL | 3 refills | Status: DC

## 2017-12-19 MED ORDER — ACCU-CHEK SOFTCLIX LANCETS MISC
3 refills | Status: DC
Start: 2017-12-19 — End: 2019-08-11

## 2017-12-19 MED ORDER — ACCU-CHEK SOFTCLIX LANCETS MISC: 3 refills | Status: DC

## 2017-12-19 NOTE — Patient Instructions (Addendum)
Please continue: - Metformin ER 1000 mg with dinner - Januvia 100 mg before b'fast  Please look into: "How not to [employer]" "The Engine 2 Diet Cookbook" "[employer] Vegan" "Prevent and Reverse Heart Disease Cookbook"  Please return in 4 months with your sugar log.

## 2017-12-19 NOTE — Progress Notes (Signed)
Patient ID: Brian [name], male   DOB: 19-Oct-1935, 82 y.o.   MRN: [id number]   HPI: Brian [name] is a 82 y.o.-year-old male,returning for f/u for DM2, dx in early 1990s, non-insulin-dependent, uncontrolled, with complications (ED). Last visit 4 months ago.  His wife passed away (cancer) 12/03/17.   He relaxed his diet since last visit (meat, fried foods), but restarted plant-based diet 2 weeks ago.  Got a steroid inj in knee few days ago.  Last hemoglobin A1c was: Lab Results  Component Value Date   HGBA1C 6.4 08/19/2017   HGBA1C 8.8 05/10/2017   HGBA1C 9.6 10/02/2016   He is on: - Metformin ER 1000 mg with dinner - Januvia 100 mg before brunch He was on Amaryl >> dizziness, palpitations.  Pt was checking his sugars less than before, 0-1x a day: - am:  131-145 >> 110s, 120 >> 121-125 - 2h after b'fast: n/c >> 134 >> n/c - before lunch: 190-220 >> 130s-140 >> 120s - 2h after lunch: n/c - before dinner: n/c > 130s-140 >> 130s - 2h after dinner: n/c - bedtime: n/c - nighttime: n/c Lowest sugar was 110 >> 115; he has hypoglycemia awareness in the 70s. Highest sugar was 140s >> 130s.  Glucometer: Accu-Chek Aviva  Diet: - Breakfast: cheerios + banana, oatmeal + Almond milk + cantaloupe - Lunch: salad + chicken, homemade veggie soups, veggie burger - Dinner: home cooked: same as lunch +/- fish/chicken/turkey; veggie burgers - Snacks: apples, bananas, peanuts  He walks in a.m. for exercise, but not in last 1-2 weeks b/c knee pain.  He lost 20 lbs in the past after stopping sodas and starting exercise consistently.  - + mildild CKD, last BUN/creatinine:  Lab Results  Component Value Date   BUN 14 07/18/2017   BUN 16 07/12/2016   CREATININE 1.02 07/18/2017   CREATININE 1.01 07/12/2016  On Benicar. - + HL; last set of lipids: Lab Results  Component Value Date   CHOL 180 07/18/2017   HDL 46.90 07/18/2017   LDLCALC 116 (H) 07/18/2017   TRIG 88.0 07/18/2017   CHOLHDL 4  07/18/2017  On Lipitor. - last eye exam was in 07/2017: No DR - No numbness and tingling in his feet.  ROS: Constitutional: no weight gain/no weight loss, no fatigue, no subjective hyperthermia, no subjective hypothermia Eyes: no blurry vision, no xerophthalmia ENT: no sore throat, no nodules palpated in throat, no dysphagia, no odynophagia, no hoarseness Cardiovascular: no CP/no SOB/no palpitations/no leg swelling Respiratory: no cough/no SOB/no wheezing Gastrointestinal: no N/no V/no D/no C/no acid reflux Musculoskeletal: no muscle aches/+ joint aches - knee Skin: no rashes, no hair loss Neurological: no tremors/no numbness/no tingling/no dizziness  I reviewed pt's medications, allergies, PMH, social hx, family hx, and changes were documented in the history of present illness. Otherwise, unchanged from my initial visit note.   Past Medical History:  Diagnosis Date  . Allergic rhinitis   . ALLERGIC RHINITIS 10/13/2007  . ANEMIA, CHRONIC DISEASE NEC 03/31/2007  . BENIGN PROSTATIC HYPERTROPHY 03/31/2007  . BPH (benign prostatic hypertrophy)   . Chest pain    Stress echo, normal, December, 2012  . Chronic anemia   . Diabetes mellitus type II   . DIABETES MELLITUS, TYPE II 03/31/2007  . [place] DISEASE, LUMBAR 03/31/2007  . ED (erectile dysfunction)   . Ejection fraction    EF  normal, stress echo, December,  2012  . History of prostatitis   . Hyperlipidemia   . HYPERLIPIDEMIA 03/31/2007  .  Hypertension   . HYPERTENSION 03/31/2007  . Nephrolithiasis 12/07/2010  . Obesity   . POLYP, ANAL AND RECTAL 03/31/2007  . Right eye trauma    hx as child   Past Surgical History:  Procedure Laterality Date  . [place] SURGERY     1999  . PROSTATE BIOPSY     s/p  . RECTAL POLYPECTOMY     Transanal excision 10/2005  . SKIN BIOPSY     s/p right upper back 2009- benign Dr. Tonia Brooms   Social History   Social History  . Marital status: Married    Spouse name: N/A  . Number of children: 2    Occupational History  . [employer] administrator     Retired 1999   Social History Main Topics  . Smoking status: Never Smoker  . Smokeless tobacco: Not on file  . Alcohol use Yes  . Drug use: No   Current Outpatient Medications on File Prior to Visit  Medication Sig Dispense Refill  . ACCU-CHEK SOFTCLIX LANCETS lancets Use as instructed twice per day Dx Code E11.9 200 each 3  . aspirin 81 MG EC tablet Take 81 mg by mouth daily.      . fluticasone (FLONASE) 50 MCG/ACT nasal spray Place 2 sprays into both nostrils daily. 16 g 11  . gabapentin (NEURONTIN) 100 MG capsule Take 1 capsule (100 mg total) by mouth at bedtime. 30 capsule 1  . glucose blood (ACCU-CHEK AVIVA) test strip Use as instructed twice per day Dx E11.9 200 each 3  . metFORMIN (GLUCOPHAGE-XR) 500 MG 24 hr tablet TAKE 2 TABLETS (1,000 MG TOTAL) BY MOUTH DAILY WITH SUPPER. 60 tablet 3  . naproxen (NAPROSYN) 500 MG tablet Take 1 tablet (500 mg total) by mouth 2 (two) times daily as needed. 60 tablet 1  . olmesartan (BENICAR) 40 MG tablet Take 1 tablet (40 mg total) by mouth daily. 30 tablet 3  . Olopatadine HCl (PATADAY) 0.2 % SOLN 1 drop each eye daily 7.5 mL 3  . sitaGLIPtin (JANUVIA) 100 MG tablet Take 1 tablet (100 mg total) by mouth daily. 30 tablet 11   No current facility-administered medications on file prior to visit.    No Known Allergies Family History  Problem Relation Age of Onset  . Colon polyps Mother     PE: BP (!) 144/82   Pulse 66   Ht 5' 10.5" (1.791 m)   Wt 199 lb (90.3 kg)   SpO2 98%   BMI 28.15 kg/m  Wt Readings from Last 3 Encounters:  12/19/17 199 lb (90.3 kg)  12/16/17 210 lb (95.3 kg)  12/03/17 208 lb (94.3 kg)   Constitutional: overweight, in NAD Eyes: PERRLA, EOMI, no exophthalmos ENT: moist mucous membranes, no thyromegaly, no cervical lymphadenopathy Cardiovascular: RRR, No MRG Respiratory: CTA B Gastrointestinal: abdomen soft, NT, ND, BS+ Musculoskeletal: no deformities,  strength intact in all 4 Skin: moist, warm, no rashes Neurological: no tremor with outstretched hands, DTR normal in all 4  ASSESSMENT: 1. DM2, non-insulin-dependent, uncontrolled, with complications - ED  2. HL  3. Overweight  PLAN:  1. Patient with long-standing, uncontrolled, diabetes, previously doing great on a plant-based diet.  As he relaxed his diet, we had to add back metformin, and then Januvia.  Sugars were improved at last visit and they were at or close to goal, therefore, we did not change the regimen at that time.  His HbA1c 4 months ago was 6.4%. - sugars are at goal despite all  the stress he had since last visit. Also, he was not very active >> will start going to the gym again. - no changes needed in his regimen - again encouraged him to continue the plant based diet and gave her cookbook references - I suggested to:  Patient Instructions  Please continue: - Metformin ER 1000 mg with dinner - Januvia 100 mg before b'fast  Please look into: "How not to [employer]" "The Engine 2 Diet Cookbook" "[employer] Vegan" "Prevent and Reverse Heart Disease Cookbook"  Please return in 4 months with your sugar log.  - today, HbA1c is 6.4% (better) - continue checking sugars at different times of the day - check 1x a day, rotating checks - advised for yearly eye exams >> he is UTD - refilled meds/supplies - Return to clinic in 4 mo with sugar log   2. HL -Latest lipid panel from 07/2017: LDL improved from 140 to 116 -Continues Lipitor without side effects.  3. Overweight - at today's visit, he lost weight c/w last visit - start going to the gym and continue plant-based diet  Philemon Kingdom, MD PhD [facility]

## 2017-12-30 ENCOUNTER — Ambulatory Visit (INDEPENDENT_AMBULATORY_CARE_PROVIDER_SITE_OTHER): Payer: Medicare HMO | Admitting: Internal Medicine

## 2017-12-30 ENCOUNTER — Encounter: Payer: Self-pay | Admitting: Internal Medicine

## 2017-12-30 VITALS — BP 142/86 | HR 65 | Temp 97.8°F | Ht 70.5 in | Wt 209.0 lb

## 2017-12-30 DIAGNOSIS — Z0001 Encounter for general adult medical examination with abnormal findings: Secondary | ICD-10-CM | POA: Diagnosis not present

## 2017-12-30 DIAGNOSIS — N521 Erectile dysfunction due to diseases classified elsewhere: Secondary | ICD-10-CM | POA: Diagnosis not present

## 2017-12-30 DIAGNOSIS — E1159 Type 2 diabetes mellitus with other circulatory complications: Secondary | ICD-10-CM | POA: Diagnosis not present

## 2017-12-30 DIAGNOSIS — I10 Essential (primary) hypertension: Secondary | ICD-10-CM | POA: Diagnosis not present

## 2017-12-30 DIAGNOSIS — IMO0002 Reserved for concepts with insufficient information to code with codable children: Secondary | ICD-10-CM

## 2017-12-30 DIAGNOSIS — M25561 Pain in right knee: Secondary | ICD-10-CM | POA: Diagnosis not present

## 2017-12-30 DIAGNOSIS — E1165 Type 2 diabetes mellitus with hyperglycemia: Secondary | ICD-10-CM | POA: Diagnosis not present

## 2017-12-30 MED ORDER — TRAMADOL HCL 50 MG PO TABS: 50.0000 mg | ORAL_TABLET | Freq: Three times a day (TID) | ORAL | 0 refills | Status: DC | PRN

## 2017-12-30 MED ORDER — TRAMADOL HCL 50 MG PO TABS: 50.0000 mg | ORAL_TABLET | Freq: Four times a day (QID) | ORAL | 1 refills | Status: DC | PRN

## 2017-12-30 NOTE — Assessment & Plan Note (Signed)

## 2017-12-30 NOTE — Assessment & Plan Note (Addendum)
Becoming chronic, high risk for fall but none yet, cont cane, for tramadol prn, MRI right knee and refer ortho  In addition to the time spent performing CPE, I spent an additional 25 minutes face to face,in which greater than 50% of this time was spent in counseling and coordination of care for patient's acute illness as documented, including the differential dx, treatment, further evaluation and other management of right knee pain, DM, HTN

## 2017-12-30 NOTE — Progress Notes (Signed)
Subjective:    Patient ID: Brian [name], male    DOB: 15-May-1936, 82 y.o.   MRN: [id number]  HPI  Here for wellness and f/u;  Overall doing ok;  Pt denies Chest pain, worsening SOB, DOE, wheezing, orthopnea, PND, worsening LE edema, palpitations, dizziness or syncope.  Pt denies neurological change such as new headache, facial or extremity weakness.  Pt denies polydipsia, polyuria, or low sugar symptoms. Pt states overall good compliance with treatment and medications, good tolerability, and has been trying to follow appropriate diet.  Pt denies worsening depressive symptoms, suicidal ideation or panic. No fever, night sweats, wt loss, loss of appetite, or other constitutional symptoms.  Pt states good ability with ADL's, has low fall risk, home safety reviewed and adequate, no other significant changes in hearing or vision, and no longer active with exercise, due to ongoing : Persistent pain right knee radiating to hip, dull, mod to severe, can barely walk even with cane, worse to walk, better to sit, has seen Sports Medicine but cortisone no help.  Asks for imaging and further evaluation Past Medical History:  Diagnosis Date  . Allergic rhinitis   . ALLERGIC RHINITIS 10/13/2007  . ANEMIA, CHRONIC DISEASE NEC 03/31/2007  . BENIGN PROSTATIC HYPERTROPHY 03/31/2007  . BPH (benign prostatic hypertrophy)   . Chest pain    Stress echo, normal, December, 2012  . Chronic anemia   . Diabetes mellitus type II   . DIABETES MELLITUS, TYPE II 03/31/2007  . [place] DISEASE, LUMBAR 03/31/2007  . ED (erectile dysfunction)   . Ejection fraction    EF  normal, stress echo, December,  2012  . History of prostatitis   . Hyperlipidemia   . HYPERLIPIDEMIA 03/31/2007  . Hypertension   . HYPERTENSION 03/31/2007  . Nephrolithiasis 12/07/2010  . Obesity   . POLYP, ANAL AND RECTAL 03/31/2007  . Right eye trauma    hx as child   Past Surgical History:  Procedure Laterality Date  . [place] SURGERY     1999  .  PROSTATE BIOPSY     s/p  . RECTAL POLYPECTOMY     Transanal excision 10/2005  . SKIN BIOPSY     s/p right upper back 2009- benign Dr. Tonia Brooms    reports that he quit smoking about 39 years ago. He has never used smokeless tobacco. He reports that he drinks alcohol. He reports that he does not use drugs. family history includes Colon polyps in his mother. No Known Allergies Current Outpatient Medications on File Prior to Visit  Medication Sig Dispense Refill  . ACCU-CHEK SOFTCLIX LANCETS lancets Use as instructed twice per day Dx Code E11.9 200 each 3  . aspirin 81 MG EC tablet Take 81 mg by mouth daily.      . fluticasone (FLONASE) 50 MCG/ACT nasal spray Place 2 sprays into both nostrils daily. 16 g 11  . gabapentin (NEURONTIN) 100 MG capsule Take 1 capsule (100 mg total) by mouth at bedtime. 30 capsule 1  . glucose blood (ACCU-CHEK AVIVA) test strip Use as instructed twice per day Dx E11.9 200 each 3  . metFORMIN (GLUCOPHAGE-XR) 500 MG 24 hr tablet Take 2 tablets (1,000 mg total) by mouth daily with supper. 180 tablet 3  . naproxen (NAPROSYN) 500 MG tablet Take 1 tablet (500 mg total) by mouth 2 (two) times daily as needed. 60 tablet 1  . olmesartan (BENICAR) 40 MG tablet Take 1 tablet (40 mg total) by mouth daily. 30 tablet 3  .  Olopatadine HCl (PATADAY) 0.2 % SOLN 1 drop each eye daily 7.5 mL 3  . sitaGLIPtin (JANUVIA) 100 MG tablet Take 1 tablet (100 mg total) by mouth daily. 30 tablet 11   No current facility-administered medications on file prior to visit.    Review of Systems Constitutional: Negative for other unusual diaphoresis, sweats, appetite or weight changes HENT: Negative for other worsening hearing loss, ear pain, facial swelling, mouth sores or neck stiffness.   Eyes: Negative for other worsening pain, redness or other visual disturbance.  Respiratory: Negative for other stridor or swelling Cardiovascular: Negative for other palpitations or other chest pain    Gastrointestinal: Negative for worsening diarrhea or loose stools, blood in stool, distention or other pain Genitourinary: Negative for hematuria, flank pain or other change in urine volume.  Musculoskeletal: Negative for myalgias or other joint swelling.  Skin: Negative for other color change, or other wound or worsening drainage.  Neurological: Negative for other syncope or numbness. Hematological: Negative for other adenopathy or swelling Psychiatric/Behavioral: Negative for hallucinations, other worsening agitation, SI, self-injury, or new decreased concentration All other system neg per pt    Objective:   Physical Exam BP (!) 142/86   Pulse 65   Temp 97.8 F (36.6 C) (Oral)   Ht 5' 10.5" (1.791 m)   Wt 209 lb (94.8 kg)   SpO2 98%   BMI 29.56 kg/m  VS noted,  Constitutional: Pt is oriented to person, place, and time. Appears well-developed and well-nourished, in no significant distress and comfortable Head: Normocephalic and atraumatic  Eyes: Conjunctivae and EOM are normal. Pupils are equal, round, and reactive to light Right Ear: External ear normal without discharge Left Ear: External ear normal without discharge Nose: Nose without discharge or deformity Mouth/Throat: Oropharynx is without other ulcerations and moist  Neck: Normal range of motion. Neck supple. No JVD present. No tracheal deviation present or significant neck LA or mass Cardiovascular: Normal rate, regular rhythm, normal heart sounds and intact distal pulses.   Pulmonary/Chest: WOB normal and breath sounds without rales or wheezing  Abdominal: Soft. Bowel sounds are normal. NT. No HSM  Musculoskeletal: Normal range of motion. Exhibits no edema except for right knee bony degenerative changes Lymphadenopathy: Has no other cervical adenopathy.  Neurological: Pt is alert and oriented to person, place, and time. Pt has normal reflexes. No cranial nerve deficit. Motor grossly intact, Gait intact Skin: Skin is  warm and dry. No rash noted or new ulcerations Psychiatric:  Has normal mood and affect. Behavior is normal without agitation No other exam findings  Lab Results  Component Value Date   WBC 5.1 07/18/2017   HGB 12.8 (L) 07/18/2017   HCT 40.2 07/18/2017   PLT 210.0 07/18/2017   GLUCOSE 197 (H) 07/18/2017   CHOL 180 07/18/2017   TRIG 88.0 07/18/2017   HDL 46.90 07/18/2017   LDLCALC 116 (H) 07/18/2017   ALT 12 07/18/2017   AST 17 07/18/2017   NA 141 07/18/2017   K 4.8 07/18/2017   CL 104 07/18/2017   CREATININE 1.02 07/18/2017   BUN 14 07/18/2017   CO2 31 07/18/2017   TSH 1.62 07/18/2017   PSA 2.64 07/18/2017   HGBA1C 6.4 12/19/2017   MICROALBUR 1.7 07/18/2017      Assessment & Plan:

## 2017-12-30 NOTE — Assessment & Plan Note (Signed)
stable overall by history and exam, recent data reviewed with pt, and pt to continue medical treatment as before,  to f/u any worsening symptoms or concerns Lab Results  Component Value Date   HGBA1C 6.4 12/19/2017

## 2017-12-30 NOTE — Assessment & Plan Note (Signed)
stable overall by history and exam, recent data reviewed with pt, and pt to continue medical treatment as before as decline further change for now,  to f/u any worsening symptoms or concerns BP Readings from Last 3 Encounters:  12/30/17 (!) 142/86  12/19/17 (!) 144/82  12/16/17 (!) 162/104

## 2017-12-30 NOTE — Patient Instructions (Addendum)
Please take all new medication as prescribed - the tramadol as needed for pain  You will be contacted regarding the referral for: MRI for the knee, and orthopedic referral  Please continue all other medications as before, and refills have been done if requested.  Please have the pharmacy call with any other refills you may need.  Please continue your efforts at being more active, low cholesterol diet, and weight control.  You are otherwise up to date with prevention measures today.  Please keep your appointments with your specialists as you may have planned  No further lab work needed today  Please remember to sign up for MyChart if you have not done so, as this will be important to you in the future with finding out test results, communicating by private email, and scheduling acute appointments online when needed.  Please return in 6 months, or sooner if needed

## 2018-01-11 ENCOUNTER — Other Ambulatory Visit: Payer: Self-pay | Admitting: Family Medicine

## 2018-01-13 ENCOUNTER — Telehealth: Payer: Self-pay | Admitting: Internal Medicine

## 2018-01-13 DIAGNOSIS — M25561 Pain in right knee: Secondary | ICD-10-CM

## 2018-01-13 NOTE — Telephone Encounter (Signed)
Ok for [employer] -   Order done for pt to have done at his convenience

## 2018-01-13 NOTE — Telephone Encounter (Signed)
Spoke to pt and he will come in on 4/30 to get xray

## 2018-01-14 ENCOUNTER — Encounter: Payer: Self-pay | Admitting: Internal Medicine

## 2018-01-14 ENCOUNTER — Ambulatory Visit (INDEPENDENT_AMBULATORY_CARE_PROVIDER_SITE_OTHER)
Admission: RE | Admit: 2018-01-14 | Discharge: 2018-01-14 | Disposition: A | Discharge status: Home / Self Care | Payer: Medicare HMO | Source: Ambulatory Visit | Attending: Internal Medicine | Admitting: Internal Medicine

## 2018-01-14 DIAGNOSIS — M25561 Pain in right knee: Secondary | ICD-10-CM | POA: Diagnosis not present

## 2018-01-14 NOTE — Telephone Encounter (Signed)
Pt's insurance has approved authorization. Left msg for pt to call back to update him

## 2018-01-16 ENCOUNTER — Ambulatory Visit: Payer: Medicare HMO | Admitting: Internal Medicine

## 2018-01-18 ENCOUNTER — Ambulatory Visit
Admission: RE | Admit: 2018-01-18 | Discharge: 2018-01-18 | Disposition: A | Discharge status: Home / Self Care | Payer: Medicare HMO | Source: Ambulatory Visit | Attending: Internal Medicine | Admitting: Internal Medicine

## 2018-01-18 DIAGNOSIS — M25561 Pain in right knee: Secondary | ICD-10-CM

## 2018-01-18 DIAGNOSIS — M1711 Unilateral primary osteoarthritis, right knee: Secondary | ICD-10-CM | POA: Diagnosis not present

## 2018-01-19 ENCOUNTER — Encounter: Payer: Self-pay | Admitting: Internal Medicine

## 2018-01-24 ENCOUNTER — Telehealth: Payer: Self-pay

## 2018-01-24 NOTE — Telephone Encounter (Signed)
Dr Raeford Razor was hoping he would make ROV at 3 wks, and seems due - please assist pt with this

## 2018-01-24 NOTE — Telephone Encounter (Signed)
Pt contacted and given results - Pt would like to know if there is anything that can be done for the pain that he is feeling in his knee.

## 2018-01-24 NOTE — Telephone Encounter (Signed)
Copied from [place] [id number]. Topic: Inquiry >> Jan 23, 2018  1:18 PM Pricilla Handler wrote: Reason for CRM: Patient called requesting to speak with Cecille Rubin. Patient wants to know if his results from his MRI at [facility] this past Saturday is available? Please call patient at cell # [phone].       Thank You!!!

## 2018-01-27 NOTE — Telephone Encounter (Signed)
Left message for patient to call back to schedule.  °

## 2018-01-27 NOTE — Telephone Encounter (Signed)
Can you have pt schedule an appt?  

## 2018-01-30 ENCOUNTER — Encounter: Payer: Self-pay | Admitting: Family Medicine

## 2018-01-30 ENCOUNTER — Ambulatory Visit (INDEPENDENT_AMBULATORY_CARE_PROVIDER_SITE_OTHER): Payer: Medicare HMO | Admitting: Family Medicine

## 2018-01-30 DIAGNOSIS — M79604 Pain in right leg: Secondary | ICD-10-CM

## 2018-01-30 MED ORDER — GABAPENTIN 100 MG PO CAPS: 100.0000 mg | ORAL_CAPSULE | Freq: Three times a day (TID) | ORAL | 3 refills | Status: DC

## 2018-01-30 NOTE — Progress Notes (Signed)
Brian [name] - 82 y.o. male MRN [id number]  Date of birth: 1936-02-27  SUBJECTIVE:  Including CC & ROS.  Chief Complaint  Patient presents with  . right knee pain    Brian [name] is a 82 y.o. male that is here today for right leg pain follow up. He received an injection on 12/16/17, had no improvement with injection. Pain is located  the lateral and anterior aspect of his right knee and has pain on his anterior distal thigh. Denies swelling.  Worse with activity. He has been taking Gabapentin once daily with no improvement.   Review is an MRI from 5/4 shows minimal degenerative changes but otherwise normal.   Review of Systems  Constitutional: Negative for fever.  HENT: Negative for congestion.   Respiratory: Negative for cough.   Cardiovascular: Negative for chest pain.  Gastrointestinal: Negative for abdominal pain.  Musculoskeletal: Negative for gait problem.  Skin: Negative for color change.    HISTORY: Past Medical, Surgical, Social, and Family History Reviewed & Updated per EMR.   Pertinent Historical Findings include:  Past Medical History:  Diagnosis Date  . Allergic rhinitis   . ALLERGIC RHINITIS 10/13/2007  . ANEMIA, CHRONIC DISEASE NEC 03/31/2007  . BENIGN PROSTATIC HYPERTROPHY 03/31/2007  . BPH (benign prostatic hypertrophy)   . Chest pain    Stress echo, normal, December, 2012  . Chronic anemia   . Diabetes mellitus type II   . DIABETES MELLITUS, TYPE II 03/31/2007  . [place] DISEASE, LUMBAR 03/31/2007  . ED (erectile dysfunction)   . Ejection fraction    EF  normal, stress echo, December,  2012  . History of prostatitis   . Hyperlipidemia   . HYPERLIPIDEMIA 03/31/2007  . Hypertension   . HYPERTENSION 03/31/2007  . Nephrolithiasis 12/07/2010  . Obesity   . POLYP, ANAL AND RECTAL 03/31/2007  . Right eye trauma    hx as child    Past Surgical History:  Procedure Laterality Date  . [place] SURGERY     1999  . PROSTATE BIOPSY     s/p  . RECTAL POLYPECTOMY      Transanal excision 10/2005  . SKIN BIOPSY     s/p right upper back 2009- benign Dr. Tonia Brooms    No Known Allergies  Family History  Problem Relation Age of Onset  . Colon polyps Mother      Social History   Socioeconomic History  . Marital status: Married    Spouse name: Not on file  . Number of children: 2  . Years of education: Not on file  . Highest education level: Not on file  Occupational History  . Occupation: Acupuncturist    Comment: Retired 1999  Social Needs  . Financial resource strain: Not on file  . Food insecurity:    Worry: Not on file    Inability: Not on file  . Transportation needs:    Medical: Not on file    Non-medical: Not on file  Tobacco Use  . Smoking status: Former Smoker    Last attempt to quit: 11/29/1978    Years since quitting: 39.1  . Smokeless tobacco: Never Used  Substance and Sexual Activity  . Alcohol use: Yes  . Drug use: No  . Sexual activity: Not on file  Lifestyle  . Physical activity:    Days per week: Not on file    Minutes per session: Not on file  . Stress: Not on file  Relationships  . Social  connections:    Talks on phone: Not on file    Gets together: Not on file    Attends religious service: Not on file    Active member of club or organization: Not on file    Attends meetings of clubs or organizations: Not on file    Relationship status: Not on file  . Intimate partner violence:    Fear of current or ex partner: Not on file    Emotionally abused: Not on file    Physically abused: Not on file    Forced sexual activity: Not on file  Other Topics Concern  . Not on file  Social History Narrative  . Not on file     PHYSICAL EXAM:  VS: BP (!) 152/84 (BP Location: Left Arm, Patient Position: Sitting, Cuff Size: Normal)   Pulse 68   Temp 98.3 F (36.8 C) (Oral)   Ht 5' 10.5" (1.791 m)   Wt 206 lb (93.4 kg)   SpO2 98%   BMI 29.14 kg/m  Physical Exam Gen: NAD, alert, cooperative with exam,  well-appearing ENT: normal lips, normal nasal mucosa,  Eye: normal EOM, normal conjunctiva and lids CV:  no edema, +2 pedal pulses   Resp: no accessory muscle use, non-labored,  Skin: no rashes, no areas of induration  Neuro: normal tone, normal sensation to touch Psych:  normal insight, alert and oriented MSK:  Right leg:  No tenderness to palpation over the anterior thigh. Normal knee flexion and extension strength resistance. Normal internal and external rotation of the hip. Negative straight leg raise bilaterally. Normal gait. Neurovascularly intact     ASSESSMENT & PLAN:   Right leg pain Pain is ongoing.  He feels it most in the anterior aspect of his distal thigh his anterior knee and anterior proximal tibia.  MRI was unrevealing for significant degenerative changes or structural abnormalities.  Likely this is more of a component of either his hip joint or sciatica. - increase the dose of gabapentin  -If no improvement can consider an intra-articular hip injection versus physical therapy versus imaging of his hip or back.

## 2018-01-30 NOTE — Patient Instructions (Signed)
Please try the gabapentin. This can make you sleepy. Please increase it to three times a day if the pain continues.  Please follow up with me in 3-4 weeks if there is no improvement.

## 2018-01-30 NOTE — Assessment & Plan Note (Addendum)
Pain is ongoing.  He feels it most in the anterior aspect of his distal thigh his anterior knee and anterior proximal tibia.  MRI was unrevealing for significant degenerative changes or structural abnormalities.  Likely this is more of a component of either his hip joint or sciatica. - increase the dose of gabapentin  -If no improvement can consider an intra-articular hip injection versus physical therapy versus imaging of his hip or back.

## 2018-02-27 ENCOUNTER — Ambulatory Visit: Payer: Medicare HMO | Admitting: Family Medicine

## 2018-02-27 DIAGNOSIS — Z0289 Encounter for other administrative examinations: Secondary | ICD-10-CM

## 2018-02-27 NOTE — Progress Notes (Deleted)
Brian [name] - 82 y.o. male MRN [id number]  Date of birth: Feb 07, 1936  SUBJECTIVE:  Including CC & ROS.  No chief complaint on file.   Brian [name] is a 82 y.o. male that is  ***.  ***   Review of Systems  HISTORY: Past Medical, Surgical, Social, and Family History Reviewed & Updated per EMR.   Pertinent Historical Findings include:  Past Medical History:  Diagnosis Date  . Allergic rhinitis   . ALLERGIC RHINITIS 10/13/2007  . ANEMIA, CHRONIC DISEASE NEC 03/31/2007  . BENIGN PROSTATIC HYPERTROPHY 03/31/2007  . BPH (benign prostatic hypertrophy)   . Chest pain    Stress echo, normal, December, 2012  . Chronic anemia   . Diabetes mellitus type II   . DIABETES MELLITUS, TYPE II 03/31/2007  . [place] DISEASE, LUMBAR 03/31/2007  . ED (erectile dysfunction)   . Ejection fraction    EF  normal, stress echo, December,  2012  . History of prostatitis   . Hyperlipidemia   . HYPERLIPIDEMIA 03/31/2007  . Hypertension   . HYPERTENSION 03/31/2007  . Nephrolithiasis 12/07/2010  . Obesity   . POLYP, ANAL AND RECTAL 03/31/2007  . Right eye trauma    hx as child    Past Surgical History:  Procedure Laterality Date  . [place] SURGERY     1999  . PROSTATE BIOPSY     s/p  . RECTAL POLYPECTOMY     Transanal excision 10/2005  . SKIN BIOPSY     s/p right upper back 2009- benign Dr. Tonia Brooms    No Known Allergies  Family History  Problem Relation Age of Onset  . Colon polyps Mother      Social History   Socioeconomic History  . Marital status: Married    Spouse name: Not on file  . Number of children: 2  . Years of education: Not on file  . Highest education level: Not on file  Occupational History  . Occupation: Acupuncturist    Comment: Retired 1999  Social Needs  . Financial resource strain: Not on file  . Food insecurity:    Worry: Not on file    Inability: Not on file  . Transportation needs:    Medical: Not on file    Non-medical: Not on file  Tobacco  Use  . Smoking status: Former Smoker    Last attempt to quit: 11/29/1978    Years since quitting: 39.2  . Smokeless tobacco: Never Used  Substance and Sexual Activity  . Alcohol use: Yes  . Drug use: No  . Sexual activity: Not on file  Lifestyle  . Physical activity:    Days per week: Not on file    Minutes per session: Not on file  . Stress: Not on file  Relationships  . Social connections:    Talks on phone: Not on file    Gets together: Not on file    Attends religious service: Not on file    Active member of club or organization: Not on file    Attends meetings of clubs or organizations: Not on file    Relationship status: Not on file  . Intimate partner violence:    Fear of current or ex partner: Not on file    Emotionally abused: Not on file    Physically abused: Not on file    Forced sexual activity: Not on file  Other Topics Concern  . Not on file  Social History Narrative  . Not  on file     PHYSICAL EXAM:  VS: There were no vitals taken for this visit. Physical Exam Gen: NAD, alert, cooperative with exam, well-appearing ENT: normal lips, normal nasal mucosa,  Eye: normal EOM, normal conjunctiva and lids CV:  no edema, +2 pedal pulses   Resp: no accessory muscle use, non-labored,  GI: no masses or tenderness, no hernia  Skin: no rashes, no areas of induration  Neuro: normal tone, normal sensation to touch Psych:  normal insight, alert and oriented MSK:  ***      ASSESSMENT & PLAN:   No problem-specific Assessment & Plan notes found for this encounter.

## 2018-04-01 ENCOUNTER — Observation Stay (HOSPITAL_COMMUNITY): Payer: Medicare HMO

## 2018-04-01 ENCOUNTER — Emergency Department (HOSPITAL_COMMUNITY): Payer: Medicare HMO

## 2018-04-01 ENCOUNTER — Encounter (HOSPITAL_COMMUNITY): Payer: Self-pay | Admitting: Emergency Medicine

## 2018-04-01 ENCOUNTER — Observation Stay (HOSPITAL_COMMUNITY)
Admission: EM | Admit: 2018-04-01 | Discharge: 2018-04-02 | Disposition: A | Discharge status: Home / Self Care | Payer: Medicare HMO | Attending: Internal Medicine | Admitting: Internal Medicine

## 2018-04-01 DIAGNOSIS — I639 Cerebral infarction, unspecified: Secondary | ICD-10-CM | POA: Diagnosis not present

## 2018-04-01 DIAGNOSIS — E1165 Type 2 diabetes mellitus with hyperglycemia: Secondary | ICD-10-CM | POA: Diagnosis not present

## 2018-04-01 DIAGNOSIS — Z7982 Long term (current) use of aspirin: Secondary | ICD-10-CM | POA: Diagnosis not present

## 2018-04-01 DIAGNOSIS — N521 Erectile dysfunction due to diseases classified elsewhere: Secondary | ICD-10-CM | POA: Diagnosis not present

## 2018-04-01 DIAGNOSIS — I6523 Occlusion and stenosis of bilateral carotid arteries: Secondary | ICD-10-CM | POA: Diagnosis not present

## 2018-04-01 DIAGNOSIS — R410 Disorientation, unspecified: Secondary | ICD-10-CM | POA: Diagnosis not present

## 2018-04-01 DIAGNOSIS — Z7984 Long term (current) use of oral hypoglycemic drugs: Secondary | ICD-10-CM | POA: Insufficient documentation

## 2018-04-01 DIAGNOSIS — E1169 Type 2 diabetes mellitus with other specified complication: Secondary | ICD-10-CM | POA: Diagnosis not present

## 2018-04-01 DIAGNOSIS — Z87891 Personal history of nicotine dependence: Secondary | ICD-10-CM | POA: Insufficient documentation

## 2018-04-01 DIAGNOSIS — I6340 Cerebral infarction due to embolism of unspecified cerebral artery: Secondary | ICD-10-CM

## 2018-04-01 DIAGNOSIS — R4781 Slurred speech: Secondary | ICD-10-CM | POA: Diagnosis not present

## 2018-04-01 DIAGNOSIS — I6501 Occlusion and stenosis of right vertebral artery: Secondary | ICD-10-CM | POA: Diagnosis not present

## 2018-04-01 DIAGNOSIS — Z79899 Other long term (current) drug therapy: Secondary | ICD-10-CM | POA: Diagnosis not present

## 2018-04-01 DIAGNOSIS — R4182 Altered mental status, unspecified: Principal | ICD-10-CM | POA: Diagnosis present

## 2018-04-01 DIAGNOSIS — R262 Difficulty in walking, not elsewhere classified: Secondary | ICD-10-CM | POA: Diagnosis not present

## 2018-04-01 DIAGNOSIS — I6389 Other cerebral infarction: Secondary | ICD-10-CM | POA: Diagnosis not present

## 2018-04-01 DIAGNOSIS — R4701 Aphasia: Secondary | ICD-10-CM | POA: Insufficient documentation

## 2018-04-01 DIAGNOSIS — I169 Hypertensive crisis, unspecified: Secondary | ICD-10-CM | POA: Diagnosis present

## 2018-04-01 DIAGNOSIS — R569 Unspecified convulsions: Secondary | ICD-10-CM | POA: Diagnosis not present

## 2018-04-01 DIAGNOSIS — E1159 Type 2 diabetes mellitus with other circulatory complications: Secondary | ICD-10-CM

## 2018-04-01 DIAGNOSIS — R42 Dizziness and giddiness: Secondary | ICD-10-CM | POA: Diagnosis not present

## 2018-04-01 DIAGNOSIS — I6310 Cerebral infarction due to embolism of unspecified precerebral artery: Secondary | ICD-10-CM

## 2018-04-01 DIAGNOSIS — I10 Essential (primary) hypertension: Secondary | ICD-10-CM | POA: Insufficient documentation

## 2018-04-01 DIAGNOSIS — N179 Acute kidney failure, unspecified: Secondary | ICD-10-CM | POA: Diagnosis not present

## 2018-04-01 LAB — DIFFERENTIAL
Abs Immature Granulocytes: 0.1 10*3/uL (ref 0.0–0.1)
BASOS ABS: 0 10*3/uL (ref 0.0–0.1)
BASOS PCT: 1 %
Eosinophils Absolute: 0.1 10*3/uL (ref 0.0–0.7)
Eosinophils Relative: 2 %
IMMATURE GRANULOCYTES: 1 %
Lymphocytes Relative: 36 %
Lymphs Abs: 2 10*3/uL (ref 0.7–4.0)
MONO ABS: 0.6 10*3/uL (ref 0.1–1.0)
Monocytes Relative: 11 %
NEUTROS ABS: 2.7 10*3/uL (ref 1.7–7.7)
NEUTROS PCT: 49 %

## 2018-04-01 LAB — COMPREHENSIVE METABOLIC PANEL
ALBUMIN: 4.1 g/dL (ref 3.5–5.0)
ALK PHOS: 55 U/L (ref 38–126)
ALT: 23 U/L (ref 0–44)
AST: 30 U/L (ref 15–41)
Anion gap: 11 (ref 5–15)
BUN: 18 mg/dL (ref 8–23)
CALCIUM: 9.9 mg/dL (ref 8.9–10.3)
CHLORIDE: 105 mmol/L (ref 98–111)
CO2: 27 mmol/L (ref 22–32)
Creatinine, Ser: 1.67 mg/dL — ABNORMAL HIGH (ref 0.61–1.24)
GFR calc non Af Amer: 37 mL/min — ABNORMAL LOW (ref >60)
GFR, EST AFRICAN AMERICAN: 43 mL/min — AB (ref >60)
GLUCOSE: 278 mg/dL — AB (ref 70–99)
Potassium: 4.2 mmol/L (ref 3.5–5.1)
Sodium: 143 mmol/L (ref 135–145)
Total Bilirubin: 0.9 mg/dL (ref 0.3–1.2)
Total Protein: 6.9 g/dL (ref 6.5–8.1)

## 2018-04-01 LAB — I-STAT CHEM 8, ED
BUN: 23 mg/dL (ref 8–23)
CHLORIDE: 104 mmol/L (ref 98–111)
CREATININE: 1.6 mg/dL — AB (ref 0.61–1.24)
Calcium, Ion: 1.22 mmol/L (ref 1.15–1.40)
Glucose, Bld: 273 mg/dL — ABNORMAL HIGH (ref 70–99)
HEMATOCRIT: 43 % (ref 39.0–52.0)
Hemoglobin: 14.6 g/dL (ref 13.0–17.0)
POTASSIUM: 4 mmol/L (ref 3.5–5.1)
SODIUM: 142 mmol/L (ref 135–145)
TCO2: 31 mmol/L (ref 22–32)

## 2018-04-01 LAB — APTT: APTT: 31 s (ref 24–36)

## 2018-04-01 LAB — RAPID URINE DRUG SCREEN, HOSP PERFORMED
AMPHETAMINES: NOT DETECTED
BENZODIAZEPINES: NOT DETECTED
Cocaine: NOT DETECTED
Opiates: NOT DETECTED
TETRAHYDROCANNABINOL: NOT DETECTED

## 2018-04-01 LAB — CBG MONITORING, ED: Glucose-Capillary: 232 mg/dL — ABNORMAL HIGH (ref 70–99)

## 2018-04-01 LAB — GLUCOSE, CAPILLARY
Glucose-Capillary: 176 mg/dL — ABNORMAL HIGH (ref 70–99)
Glucose-Capillary: 200 mg/dL — ABNORMAL HIGH (ref 70–99)

## 2018-04-01 LAB — URINALYSIS, ROUTINE W REFLEX MICROSCOPIC
BILIRUBIN URINE: NEGATIVE
Glucose, UA: 50 mg/dL — AB
HGB URINE DIPSTICK: NEGATIVE
KETONES UR: NEGATIVE mg/dL
Leukocytes, UA: NEGATIVE
Nitrite: NEGATIVE
Protein, ur: NEGATIVE mg/dL
SPECIFIC GRAVITY, URINE: 1.029 (ref 1.005–1.030)
pH: 5 (ref 5.0–8.0)

## 2018-04-01 LAB — CBC
HCT: 44.9 % (ref 39.0–52.0)
HEMOGLOBIN: 13.1 g/dL (ref 13.0–17.0)
MCH: 25.7 pg — ABNORMAL LOW (ref 26.0–34.0)
MCHC: 29.2 g/dL — ABNORMAL LOW (ref 30.0–36.0)
MCV: 88.2 fL (ref 78.0–100.0)
Platelets: 228 10*3/uL (ref 150–400)
RBC: 5.09 MIL/uL (ref 4.22–5.81)
RDW: 13 % (ref 11.5–15.5)
WBC: 5.4 10*3/uL (ref 4.0–10.5)

## 2018-04-01 LAB — I-STAT TROPONIN, ED: Troponin i, poc: 0.01 ng/mL (ref 0.00–0.08)

## 2018-04-01 LAB — PROTIME-INR
INR: 1.01
Prothrombin Time: 13.2 seconds (ref 11.4–15.2)

## 2018-04-01 MED ORDER — GABAPENTIN 100 MG PO CAPS
100.0000 mg | ORAL_CAPSULE | Freq: Three times a day (TID) | ORAL | Status: DC
  Administered 2018-04-01 – 2018-04-02 (×4): 100 mg via ORAL
  Filled 2018-04-01 (×4): qty 1

## 2018-04-01 MED ORDER — ACETAMINOPHEN 325 MG PO TABS: 650.0000 mg | ORAL_TABLET | ORAL | Status: DC | PRN

## 2018-04-01 MED ORDER — ONDANSETRON HCL 4 MG PO TABS: 4.0000 mg | ORAL_TABLET | Freq: Four times a day (QID) | ORAL | Status: DC | PRN

## 2018-04-01 MED ORDER — SODIUM CHLORIDE 0.9 % IV SOLN
75.0000 mL/h | INTRAVENOUS | Status: DC
  Administered 2018-04-01: 75 mL/h via INTRAVENOUS

## 2018-04-01 MED ORDER — DOCUSATE SODIUM 100 MG PO CAPS
100.0000 mg | ORAL_CAPSULE | Freq: Two times a day (BID) | ORAL | Status: DC
  Administered 2018-04-01 – 2018-04-02 (×2): 100 mg via ORAL
  Filled 2018-04-01 (×2): qty 1

## 2018-04-01 MED ORDER — ENOXAPARIN SODIUM 40 MG/0.4ML ~~LOC~~ SOLN
40.0000 mg | SUBCUTANEOUS | Status: DC
  Administered 2018-04-02: 40 mg via SUBCUTANEOUS
  Filled 2018-04-01: qty 0.4

## 2018-04-01 MED ORDER — OLOPATADINE HCL 0.1 % OP SOLN
1.0000 [drp] | Freq: Two times a day (BID) | OPHTHALMIC | Status: DC
  Filled 2018-04-01: qty 5

## 2018-04-01 MED ORDER — POLYETHYLENE GLYCOL 3350 17 G PO PACK: 17.0000 g | PACK | Freq: Every day | ORAL | Status: DC | PRN

## 2018-04-01 MED ORDER — INSULIN ASPART 100 UNIT/ML ~~LOC~~ SOLN: 0.0000 [IU] | Freq: Every day | SUBCUTANEOUS | Status: DC

## 2018-04-01 MED ORDER — LORAZEPAM 2 MG/ML IJ SOLN: 1.0000 mg | INTRAMUSCULAR | Status: DC | PRN

## 2018-04-01 MED ORDER — ACETAMINOPHEN 650 MG RE SUPP: 650.0000 mg | RECTAL | Status: DC | PRN

## 2018-04-01 MED ORDER — IOPAMIDOL (ISOVUE-370) INJECTION 76%
50.0000 mL | Freq: Once | INTRAVENOUS | Status: AC | PRN
  Administered 2018-04-01: 50 mL via INTRAVENOUS

## 2018-04-01 MED ORDER — ASPIRIN EC 81 MG PO TBEC: 81.0000 mg | DELAYED_RELEASE_TABLET | Freq: Every day | ORAL | Status: DC

## 2018-04-01 MED ORDER — FLUTICASONE PROPIONATE 50 MCG/ACT NA SUSP
2.0000 | Freq: Every day | NASAL | Status: DC
  Filled 2018-04-01: qty 16

## 2018-04-01 MED ORDER — ONDANSETRON HCL 4 MG/2ML IJ SOLN: 4.0000 mg | Freq: Four times a day (QID) | INTRAMUSCULAR | Status: DC | PRN

## 2018-04-01 MED ORDER — INSULIN ASPART 100 UNIT/ML ~~LOC~~ SOLN
0.0000 [IU] | Freq: Three times a day (TID) | SUBCUTANEOUS | Status: DC
  Administered 2018-04-01 – 2018-04-02 (×2): 3 [IU] via SUBCUTANEOUS
  Administered 2018-04-02: 5 [IU] via SUBCUTANEOUS

## 2018-04-01 NOTE — Consult Note (Signed)
Neurology Consultation  Reason for Consult: CODE STROKE Referring Physician: Dr Kathrynn Humble  CC: Code stroke  History is obtained from: Patient's son at bedside  HPI: Brian [name] is a 82 y.o. male past medical history of diabetes, hypertension, chronic anemia, presented to the emergency room for evaluation of episode of left-sided weakness, drooling and unresponsiveness. The patient was usual state of health this morning and drove to his son's house at around 10:30 AM.  He said he did not feel right and wanted to eat something before he took his medications.  He had not taken his antihypertensive his diabetes medications this morning. He had a sandwich and after that he became less responsive and is left arm became stiff with his gaze to the left.  This lasted for a few minutes, the son could not quantify.  Following this his speech became completely garbled and he started to drool from his mouth.  He was brought into the emergency room for an emergent evaluation for concern for this abnormal episode. He is never had a seizure before.  No episodes of syncope in the past.  CBG was in the 200s at the time of initial encounter in the emergency room. Patient's blood pressures were also within normal range but he was tachycardic on initial evaluation.  LKW: 10:30 AM on 04/01/2018 tpa given?: no, rapidly improved symptoms-description of presenting event likely seizure Premorbid modified Rankin scale (mRS):0  ROS:ROS was performed and is negative except as noted in the HPI.    Past Medical History:  Diagnosis Date  . Allergic rhinitis   . ALLERGIC RHINITIS 10/13/2007  . ANEMIA, CHRONIC DISEASE NEC 03/31/2007  . BENIGN PROSTATIC HYPERTROPHY 03/31/2007  . BPH (benign prostatic hypertrophy)   . Chest pain    Stress echo, normal, December, 2012  . Chronic anemia   . Diabetes mellitus type II   . DIABETES MELLITUS, TYPE II 03/31/2007  . [place] DISEASE, LUMBAR 03/31/2007  . ED (erectile dysfunction)    . Ejection fraction    EF  normal, stress echo, December,  2012  . History of prostatitis   . Hyperlipidemia   . HYPERLIPIDEMIA 03/31/2007  . Hypertension   . HYPERTENSION 03/31/2007  . Nephrolithiasis 12/07/2010  . Obesity   . POLYP, ANAL AND RECTAL 03/31/2007  . Right eye trauma    hx as child    Family History  Problem Relation Age of Onset  . Colon polyps Mother    Social History:   reports that he quit smoking about 39 years ago. He has never used smokeless tobacco. He reports that he drinks alcohol. He reports that he does not use drugs.  Medications No current facility-administered medications for this encounter.   Current Outpatient Medications:  .  ACCU-CHEK SOFTCLIX LANCETS lancets, Use as instructed twice per day Dx Code E11.9, Disp: 200 each, Rfl: 3 .  aspirin 81 MG EC tablet, Take 81 mg by mouth daily.  , Disp: , Rfl:  .  fluticasone (FLONASE) 50 MCG/ACT nasal spray, Place 2 sprays into both nostrils daily., Disp: 16 g, Rfl: 11 .  gabapentin (NEURONTIN) 100 MG capsule, Take 1 capsule (100 mg total) by mouth 3 (three) times daily., Disp: 90 capsule, Rfl: 3 .  glucose blood (ACCU-CHEK AVIVA) test strip, Use as instructed twice per day Dx E11.9, Disp: 200 each, Rfl: 3 .  metFORMIN (GLUCOPHAGE-XR) 500 MG 24 hr tablet, Take 2 tablets (1,000 mg total) by mouth daily with supper., Disp: 180 tablet, Rfl: 3 .  naproxen (NAPROSYN) 500 MG tablet, Take 1 tablet (500 mg total) by mouth 2 (two) times daily as needed., Disp: 60 tablet, Rfl: 1 .  olmesartan (BENICAR) 40 MG tablet, Take 1 tablet (40 mg total) by mouth daily., Disp: 30 tablet, Rfl: 3 .  Olopatadine HCl (PATADAY) 0.2 % SOLN, 1 drop each eye daily, Disp: 7.5 mL, Rfl: 3 .  sitaGLIPtin (JANUVIA) 100 MG tablet, Take 1 tablet (100 mg total) by mouth daily., Disp: 30 tablet, Rfl: 11 .  traMADol (ULTRAM) 50 MG tablet, Take 1 tablet (50 mg total) by mouth every 8 (eight) hours as needed., Disp: 30 tablet, Rfl: 0  Exam: Current  vital signs: BP (!) 154/82   Pulse (!) 158   Resp (!) 31   Wt 93.4 kg (206 lb)   SpO2 95%   BMI 29.14 kg/m  Vital signs in last 24 hours: Pulse Rate:  [156-158] 158 (07/16 1215) Resp:  [26-31] 31 (07/16 1215) BP: (102-154)/(78-85) 154/82 (07/16 1230) SpO2:  [95 %-100 %] 95 % (07/16 1215) Weight:  [93.4 kg (206 lb)] 93.4 kg (206 lb) (07/16 1155) On initial exam: General: Patient is awake, appears confused. CVS: S1-S2 heard regular rate rhythm Lungs: Clear to auscultation Abdomen: Nondistended nontender Neurological exam Patient is awake, does not follow any commands. His speech is garbled- word salad. He also has mild dysarthria. Cranial nerves: Pupils equal round react to light, extra ocular movements intact, visual fields appear full to threat, face is symmetric, auditory acuity appears intact to conversational voice. Motor exam: Did not follow commands due to poor attention concentration but was able to move all 4 extremities without focal weakness. Sensory exam: Intact withdrawal to noxious stim all over Cerebellar: Did not cooperate for the exam Repeat exam: Within a few minutes, after the CT scan was done, he appeared more coherent, his speech was more clear, he was less dysarthric, he was able to tell his name, his age and where he was.  He was able to follow all commands.  Over the next few minutes, he improved and his back to baseline per his family. At this time, the examination reveals no focal neurological deficits. NIHSS-initial 1a Level of Conscious.: 0 1b LOC Questions: 2 1c LOC Commands: 2 2 Best Gaze: 0 3 Visual: 0 4 Facial Palsy: 0 5a Motor Arm - left: 0 5b Motor Arm - Right: 0 6a Motor Leg - Left: 0 6b Motor Leg - Right: 0 7 Limb Ataxia: 0 8 Sensory: 0 9 Best Language: 2 10 Dysarthria: 1 11 Extinct. and Inatten.:0  TOTAL: 7    Labs I have reviewed labs in epic and the results pertinent to this consultation are: CBC    Component Value Date/Time    WBC 5.1 07/18/2017 1122   RBC 4.79 07/18/2017 1122   HGB 12.8 (L) 07/18/2017 1122   HCT 40.2 07/18/2017 1122   PLT 210.0 07/18/2017 1122   MCV 83.8 07/18/2017 1122   MCHC 31.7 07/18/2017 1122   RDW 14.1 07/18/2017 1122   LYMPHSABS 1.2 07/18/2017 1122   MONOABS 0.5 07/18/2017 1122   EOSABS 0.2 07/18/2017 1122   BASOSABS 0.1 07/18/2017 1122    CMP     Component Value Date/Time   NA 141 07/18/2017 1122   K 4.8 07/18/2017 1122   CL 104 07/18/2017 1122   CO2 31 07/18/2017 1122   GLUCOSE 197 (H) 07/18/2017 1122   BUN 14 07/18/2017 1122   CREATININE 1.02 07/18/2017 1122   CALCIUM 10.1 07/18/2017  1122   PROT 7.3 07/18/2017 1122   ALBUMIN 4.5 07/18/2017 1122   AST 17 07/18/2017 1122   ALT 12 07/18/2017 1122   ALKPHOS 64 07/18/2017 1122   BILITOT 0.6 07/18/2017 1122   GFRNONAA 78.70 05/29/2010 0948   GFRAA 85 06/26/2007 0901    Imaging I have reviewed the images obtained:  CT-scan of the brain-no acute changes small chronic right cerebellar infarct and moderate white matter disease. CT [place] head and neck-no LVO.  Mild atherosclerotic changes at both carotid bifurcations Intracranial atherosclerosis. 70% stenosis of the distal left M1 with no occlusion.  50% stenosis of the right vertebral region.  Assessment:  82 year old man with history of diabetes, hypertension presenting for evaluation of episode of left-sided weakness drooling and unresponsiveness which was followed after he had left arm stiffening and leftward gaze deviation. History and description of the event and rapid improvement of symptoms strongly suspicious for a seizure. CT head negative for acute change. CT [place] head and neck shows mild orthostatic changes in both carotid bifurcations, no LVO but intracranial sclerosis and 70% stenosis of the distal left M1 with no occlusion and 50% stenosis of the right vertebral artery origin. At this time, differentials are: -Seizure-more likely - TIA-less likely given  short duration and complete resolution of symptoms.  Recommendations: Admit for further work-up to hospitalist Maintain on telemetry MRI brain with and without contrast Routine EEG No antiepileptics as this is a first event.  If the EEG or MRI shows any abnormality, will start on antiepileptics. UA Chest x-[place]iel Urine toxicology screen Seizure precautions including no driving per [facility] law discussed in detail with the patient. Detailed precautions recommended below. -- Per [facility] statutes, patients with seizures are not allowed to drive until they have been seizure-free for six months.   Use caution when using heavy equipment or power tools. Avoid working on ladders or at heights. Take showers instead of baths. Ensure the water temperature is not too high on the home water heater. Do not go swimming alone. Do not lock yourself in a room alone (i.e. bathroom). When caring for infants or small children, sit down when holding, feeding, or changing them to minimize risk of injury to the child in the event you have a seizure. Maintain good sleep hygiene. Avoid alcohol.   If patient has another seizure, call 911 and bring them back to the ED if: A. The seizure lasts longer than 5 minutes.  B. The patient doesn't wake shortly after the seizure or has new problems such as difficulty seeing, speaking or moving following the seizure C. The patient was injured during the seizure D. The patient has a temperature over 102 F (39C) E. The patient vomited during the seizure and now is having trouble breathing -- Amie Portland, MD [facility] Pager: [phone] If 7pm to 7am, please call on call as listed on [facility]ION.

## 2018-04-01 NOTE — ED Provider Notes (Signed)
[facility] EMERGENCY DEPARTMENT Provider Note   CSN: [id number] Arrival date & time: 04/01/18  1141   An emergency department physician performed an initial assessment on this suspected stroke patient at 1150.  History   Chief Complaint Chief Complaint  Patient presents with  . Code Stroke    HPI Joshuajames KRISTAIN HU is a 82 y.o. male.  HPI  Level 5 caveat, patient is unresponsive.  82 year old male comes in with chief complaint of slurred speech, drooling and confusion. Patient has history of diabetes, hypertension, hyperlipidemia.  According to patient's brother, patient arrived to his home at 10:30 AM and was doing well.  Soon after, patient started complaining of dizziness and then he started having slurring of the speech.  Eventually, patient started drooling and was disoriented and not able to answer questions appropriately, therefore family decided to bring him into the ER.  There is no history of stroke, seizures.  No history of substance abuse or similar episodes in the past.  Past Medical History:  Diagnosis Date  . Allergic rhinitis   . ALLERGIC RHINITIS 10/13/2007  . ANEMIA, CHRONIC DISEASE NEC 03/31/2007  . BENIGN PROSTATIC HYPERTROPHY 03/31/2007  . BPH (benign prostatic hypertrophy)   . Chest pain    Stress echo, normal, December, 2012  . Chronic anemia   . Diabetes mellitus type II   . DIABETES MELLITUS, TYPE II 03/31/2007  . [place] DISEASE, LUMBAR 03/31/2007  . ED (erectile dysfunction)   . Ejection fraction    EF  normal, stress echo, December,  2012  . History of prostatitis   . Hyperlipidemia   . HYPERLIPIDEMIA 03/31/2007  . Hypertension   . HYPERTENSION 03/31/2007  . Nephrolithiasis 12/07/2010  . Obesity   . POLYP, ANAL AND RECTAL 03/31/2007  . Right eye trauma    hx as child    Patient Active Problem List   Diagnosis Date Noted  . Right leg pain 12/04/2017  . Grief 12/04/2017  . Uncontrolled type 2 diabetes mellitus with circulatory  disorder causing erectile dysfunction ([place]) 10/02/2016  . Allergic conjunctivitis 01/12/2016  . Abdominal pain, other specified site 06/23/2012  . Erectile dysfunction 12/13/2011  . Ejection fraction   . Chest pain   . Fatigue 06/14/2011  . Encounter for well adult exam with abnormal findings 06/10/2011  . Nephrolithiasis 12/07/2010  . Hypertension 12/07/2010  . SKIN LESION 03/01/2008  . ALLERGIC RHINITIS 10/13/2007  . Hyperlipidemia 03/31/2007  . Overweight (BMI 25.0-29.9) 03/31/2007  . ANEMIA, CHRONIC DISEASE NEC 03/31/2007  . POLYP, ANAL AND RECTAL 03/31/2007  . BENIGN PROSTATIC HYPERTROPHY 03/31/2007  . [place] DISEASE, LUMBAR 03/31/2007    Past Surgical History:  Procedure Laterality Date  . [place] SURGERY     1999  . PROSTATE BIOPSY     s/p  . RECTAL POLYPECTOMY     Transanal excision 10/2005  . SKIN BIOPSY     s/p right upper back 2009- benign Dr. Tonia Brooms        Home Medications    Prior to Admission medications   Medication Sig Start Date End Date Taking? Authorizing Provider  ACCU-CHEK SOFTCLIX LANCETS lancets Use as instructed twice per day Dx Code E11.9 12/19/17   Philemon Kingdom, MD  aspirin 81 MG EC tablet Take 81 mg by mouth daily.      [provider]  fluticasone (FLONASE) 50 MCG/ACT nasal spray Place 2 sprays into both nostrils daily. 01/12/16   [name], MD  gabapentin (NEURONTIN) 100 MG capsule  Take 1 capsule (100 mg total) by mouth 3 (three) times daily. 01/30/18   Rosemarie Ax, MD  glucose blood (ACCU-CHEK AVIVA) test strip Use as instructed twice per day Dx E11.9 12/19/17   Philemon Kingdom, MD  metFORMIN (GLUCOPHAGE-XR) 500 MG 24 hr tablet Take 2 tablets (1,000 mg total) by mouth daily with supper. 12/19/17   Philemon Kingdom, MD  naproxen (NAPROSYN) 500 MG tablet Take 1 tablet (500 mg total) by mouth 2 (two) times daily as needed. 12/03/17 12/03/18  Rosemarie Ax, MD  olmesartan (BENICAR) 40 MG tablet Take 1 tablet (40 mg total)  by mouth daily. 12/16/17   Rosemarie Ax, MD  Olopatadine HCl (PATADAY) 0.2 % SOLN 1 drop each eye daily 01/12/16   [name], MD  sitaGLIPtin (JANUVIA) 100 MG tablet Take 1 tablet (100 mg total) by mouth daily. 05/10/17   Philemon Kingdom, MD  traMADol (ULTRAM) 50 MG tablet Take 1 tablet (50 mg total) by mouth every 8 (eight) hours as needed. 12/30/17   [name], MD    Family History Family History  Problem Relation Age of Onset  . Colon polyps Mother     Social History Social History   Tobacco Use  . Smoking status: Former Smoker    Last attempt to quit: 11/29/1978    Years since quitting: 39.3  . Smokeless tobacco: Never Used  Substance Use Topics  . Alcohol use: Yes  . Drug use: No     Allergies   Patient has no known allergies.   Review of Systems Review of Systems  Unable to perform ROS: Patient unresponsive     Physical Exam Updated Vital Signs BP (!) 154/82   Pulse (!) 158   Resp (!) 31   Wt 93.4 kg (206 lb)   SpO2 95%   BMI 29.14 kg/m   Physical Exam  Constitutional: He appears well-developed.  HENT:  Head: Atraumatic.  Eyes: EOM are normal.  Neck: Neck supple.  Cardiovascular:  Tachycardia  Pulmonary/Chest: Effort normal.  Neurological:  Patient is following simple commands, he has mild dysarthria and appears confused (?  Aphasia). Moving all 4 extremities Patient has drooling  Skin: Skin is warm.  Nursing note and vitals reviewed.    ED Treatments / Results  Labs (all labs ordered are listed, but only abnormal results are displayed) Labs Reviewed  CBC - Abnormal; Notable for the following components:      Result Value   MCH 25.7 (*)    MCHC 29.2 (*)    All other components within normal limits  COMPREHENSIVE METABOLIC PANEL - Abnormal; Notable for the following components:   Glucose, Bld 278 (*)    Creatinine, Ser 1.67 (*)    GFR calc non Af Amer 37 (*)    GFR calc Af Amer 43 (*)    All other components within normal  limits  CBG MONITORING, ED - Abnormal; Notable for the following components:   Glucose-Capillary 232 (*)    All other components within normal limits  I-STAT CHEM 8, ED - Abnormal; Notable for the following components:   Creatinine, Ser 1.60 (*)    Glucose, Bld 273 (*)    All other components within normal limits  PROTIME-INR  APTT  DIFFERENTIAL  ETHANOL  RAPID URINE DRUG SCREEN, HOSP PERFORMED  URINALYSIS, ROUTINE W REFLEX MICROSCOPIC  I-STAT TROPONIN, ED  `  ED ECG REPORT   Date: 04/01/2018  Rate: 162  Rhythm: supraventricular tachycardia (SVT)  QRS  Axis: normal  Intervals: normal  ST/T Wave abnormalities: nonspecific T wave changes  Conduction Disutrbances:none  Narrative Interpretation:   Old EKG Reviewed: changes noted  I have personally reviewed the EKG tracing and agree with the computerized printout as noted.   EKG EKG Interpretation  Date/Time:  Tuesday April 01 2018 13:22:37 EDT Ventricular Rate:  99 PR Interval:    QRS Duration: 101 QT Interval:  370 QTC Calculation: 475 R Axis:   -63 Text Interpretation:  Sinus rhythm Prolonged PR interval Left anterior fascicular block Abnormal R-wave progression, early transition Left ventricular hypertrophy Borderline T abnormalities, inferior leads Baseline wander in lead(s) V2 Confirmed by Wandra Arthurs [id number]), editor Shon Hale [phone]) on 04/01/2018 1:35:12 PM   Radiology Ct Angio Head W Or Wo Contrast  Result Date: 04/01/2018 CLINICAL DATA:  Acute presentation with slurred speech and weakness. EXAM: CT ANGIOGRAPHY HEAD AND NECK TECHNIQUE: Multidetector CT imaging of the head and neck was performed using the standard protocol during bolus administration of intravenous contrast. Multiplanar CT image reconstructions and MIPs were obtained to evaluate the vascular anatomy. Carotid stenosis measurements (when applicable) are obtained utilizing NASCET criteria, using the distal internal carotid diameter as the  denominator. CONTRAST:  50mL ISOVUE-370 IOPAMIDOL (ISOVUE-370) INJECTION 76% COMPARISON:  Head CT earlier same day. FINDINGS: CTA NECK FINDINGS Aortic arch: Aortic atherosclerosis. No aneurysm or dissection. Branching pattern of the brachiocephalic vessels from the arch is normal without origin stenosis. Right carotid system: Common carotid artery widely patent to the bifurcation. Atherosclerotic plaque of the ICA bulb with mild irregularity but no measurable stenosis. ICA tortuous but widely patent beyond that. Left carotid system: Common carotid artery widely patent to the bifurcation. There is motion degradation at the bifurcation, but I do not suspect the presence of stenosis or irregularity. Mild plaque in the ICA bulb. ICA tortuous but widely patent beyond that. Vertebral arteries: Left vertebral artery is dominant. Both vertebral artery origins are patent. No stenosis on the left. There is calcified plaque at the origin on the right with 50% stenosis. Skeleton: Ordinary cervical spondylosis. Other neck: No mass or lymphadenopathy. Upper chest: Normal Review of the MIP images confirms the above findings CTA HEAD FINDINGS Anterior circulation: Both internal carotid arteries are patent through the skull base and siphon regions. Mild atherosclerotic calcification in the distal carotid siphon and supraclinoid ICA regions but no stenosis more than about 30%. The anterior and middle cerebral vessels are patent proximally. On the right, there is atherosclerotic narrowing of the M2 and M3 branches but no occluded or missing branches identified. On the left, there is a 70% stenosis of the distal M1 segment. Beyond that, the M2 and M3 branches show some atherosclerotic irregularity but I do not identify an occluded branch. Posterior circulation: Both vertebral arteries widely patent to the basilar. No basilar stenosis. Posterior circulation branch vessels are patent. Mild atherosclerotic irregularity of the more distal  PCA branches. Venous sinuses: Patent and normal. Anatomic variants: None significant. Delayed phase: No abnormal enhancement. Review of the MIP images confirms the above findings IMPRESSION: Mild atherosclerotic change at both carotid bifurcations. Slight irregularity of the ICA bulb on the right, but without measurable stenosis. Intracranial medium vessel atherosclerotic change. 70% stenosis of the distal M1 segment on the left. I do not identify any occluded branches. 50% stenosis of the right vertebral artery origin. Electronically Signed   By: Nelson Chimes M.D.   On: 04/01/2018 12:48   Ct Angio Neck W Or Wo Contrast  Result Date: 04/01/2018 CLINICAL DATA:  Acute presentation with slurred speech and weakness. EXAM: CT ANGIOGRAPHY HEAD AND NECK TECHNIQUE: Multidetector CT imaging of the head and neck was performed using the standard protocol during bolus administration of intravenous contrast. Multiplanar CT image reconstructions and MIPs were obtained to evaluate the vascular anatomy. Carotid stenosis measurements (when applicable) are obtained utilizing NASCET criteria, using the distal internal carotid diameter as the denominator. CONTRAST:  50mL ISOVUE-370 IOPAMIDOL (ISOVUE-370) INJECTION 76% COMPARISON:  Head CT earlier same day. FINDINGS: CTA NECK FINDINGS Aortic arch: Aortic atherosclerosis. No aneurysm or dissection. Branching pattern of the brachiocephalic vessels from the arch is normal without origin stenosis. Right carotid system: Common carotid artery widely patent to the bifurcation. Atherosclerotic plaque of the ICA bulb with mild irregularity but no measurable stenosis. ICA tortuous but widely patent beyond that. Left carotid system: Common carotid artery widely patent to the bifurcation. There is motion degradation at the bifurcation, but I do not suspect the presence of stenosis or irregularity. Mild plaque in the ICA bulb. ICA tortuous but widely patent beyond that. Vertebral arteries: Left  vertebral artery is dominant. Both vertebral artery origins are patent. No stenosis on the left. There is calcified plaque at the origin on the right with 50% stenosis. Skeleton: Ordinary cervical spondylosis. Other neck: No mass or lymphadenopathy. Upper chest: Normal Review of the MIP images confirms the above findings CTA HEAD FINDINGS Anterior circulation: Both internal carotid arteries are patent through the skull base and siphon regions. Mild atherosclerotic calcification in the distal carotid siphon and supraclinoid ICA regions but no stenosis more than about 30%. The anterior and middle cerebral vessels are patent proximally. On the right, there is atherosclerotic narrowing of the M2 and M3 branches but no occluded or missing branches identified. On the left, there is a 70% stenosis of the distal M1 segment. Beyond that, the M2 and M3 branches show some atherosclerotic irregularity but I do not identify an occluded branch. Posterior circulation: Both vertebral arteries widely patent to the basilar. No basilar stenosis. Posterior circulation branch vessels are patent. Mild atherosclerotic irregularity of the more distal PCA branches. Venous sinuses: Patent and normal. Anatomic variants: None significant. Delayed phase: No abnormal enhancement. Review of the MIP images confirms the above findings IMPRESSION: Mild atherosclerotic change at both carotid bifurcations. Slight irregularity of the ICA bulb on the right, but without measurable stenosis. Intracranial medium vessel atherosclerotic change. 70% stenosis of the distal M1 segment on the left. I do not identify any occluded branches. 50% stenosis of the right vertebral artery origin. Electronically Signed   By: Nelson Chimes M.D.   On: 04/01/2018 12:48   Ct Head Code Stroke Wo Contrast  Result Date: 04/01/2018 CLINICAL DATA:  Code stroke. 82 year old male with acute onset stroke-like symptoms, slurred speech, confusion and decreased mental status. EXAM:  CT HEAD WITHOUT CONTRAST TECHNIQUE: Contiguous axial images were obtained from the base of the skull through the vertex without intravenous contrast. COMPARISON:  None. FINDINGS: Brain: Patchy bilateral cerebral white matter hypodensity. Chronic appearing small right cerebellar infarct best seen on coronal image 56. No midline shift, ventriculomegaly, mass effect, evidence of mass lesion, intracranial hemorrhage or evidence of cortically based acute infarction. No cerebral cortical encephalomalacia identified. Vascular: Mild Calcified atherosclerosis at the skull base. No suspicious intracranial vascular hyperdensity. Skull: Negative. Sinuses/Orbits: Visualized paranasal sinuses and mastoids are well pneumatized. Other: Postoperative changes to both globes. No acute orbit or scalp soft tissue findings. ASPECTS [facility] Stroke Program Early CT  Score) - Ganglionic level infarction (caudate, lentiform nuclei, internal capsule, insula, M1-M3 cortex): 7 - Supraganglionic infarction (M4-M6 cortex): 3 Total score (0-10 with 10 being normal): 10 IMPRESSION: 1. No acute cortically based infarct or intracranial hemorrhage. ASPECTS is 10. 2. Small chronic appearing right cerebellar infarct and moderate for age cerebral white matter disease. 3. These results were communicated to Dr. Rory Percy at 12:20 [place] 7/16/2019by text page via the [facility] messaging system. Electronically Signed   By: Genevie Ann M.D.   On: 04/01/2018 12:20    Procedures Procedures (including critical care time)  Medications Ordered in ED Medications  iopamidol (ISOVUE-370) 76 % injection 50 mL (50 mLs Intravenous Contrast Given 04/01/18 1234)     Initial Impression / Assessment and Plan / ED Course  I have reviewed the triage vital signs and the nursing notes.  Pertinent labs & imaging results that were available during my care of the patient were reviewed by me and considered in my medical decision making (see chart for details).    82 year old  male comes in with chief complaint of effusion and slurred speech. Patient was last normal 40 minutes ago, code stroke activated.  Upon further history from patient's son, it appears that there could have been a seizure-like activity prior to patient becoming confused.  Working diagnosis is seizures versus stroke.  Patient was also noted to be significantly tachycardic.  Neuro team at bedside.  1:43 PM Patient reassessed.  His heart rate is now in the 80s. Neuro team thinks that likely this is more of a seizure episode versus stroke, and given patient's significant improvement, it is likely the case.  They recommend that patient be admitted to the hospital and get further work-up completed however.   Final Clinical Impressions(s) / ED Diagnoses   Final diagnoses:  Slurred speech  Aphasia  Seizure-like activity [facility])    ED Discharge Orders    None       Varney Biles, MD 04/01/18 1343

## 2018-04-01 NOTE — Procedures (Signed)
ELECTROENCEPHALOGRAM REPORT   Patient: Brian [name]       Room #: 3W10C EEG No. ID: [id number] Age: 82 y.o.        Sex: male Referring Physician: Lorin Mercy Report Date:  04/01/2018        Interpreting Physician: Alexis Goodell  History: [name] is an 82 y.o. male with facial twitching evaluated to rule out seizure  Medications:  ASA, Colace, Flonase, Insulin, Neurontin  Conditions of Recording:  This is a 16 channel EEG carried out with the patient in the awake, drowsy and asleep states.  Description:  The waking background activity consists of a low voltage, symmetrical, fairly well organized, 8-8.5 Hz alpha activity, seen from the parieto-occipital and posterior temporal regions.  Low voltage fast activity, poorly organized, is seen anteriorly and is at times superimposed on more posterior regions.  A mixture of theta and alpha rhythms are seen from the central and temporal regions. The patient drowses with slowing to irregular, low voltage theta and beta activity.   The patient goes in to a light sleep with symmetrical sleep spindles, vertex central sharp transients and irregular slow activity.   No epileptiform activity is noted.   Hyperventilation and intermittent photic stimulation were not performed.   IMPRESSION: Normal electroencephalogram, awake, asleep and with activation procedures. There are no focal lateralizing or epileptiform features.   Alexis Goodell, MD Neurology [phone] 04/01/2018, 4:50 PM

## 2018-04-01 NOTE — Code Documentation (Signed)
82yo male arriving to [facility] via private vehicle at 1141. Patient reported feeling dizzy prior to his arrival at his son's house this morning and stopped to get something to eat on the way. He did not take his medications this morning. Patient was discussing family business with his son at 67 when he reported dizziness and needing to lay down. He laid down but sat back up d/t feeling nauseated. Son checked his BP and HR and noted both were high. Patient's son proceeded to get him in the car to take him to the doctor's office when patient stopped talking, and his son noted left facial drooping and drooling. At that point, his son transported him to the ED. Code stroke called in the ED. Patient to CT. Stroke team to the bedside in CT. CT completed followed by CTA head and neck. NIHSS 7, see documentation for details and code stroke times. Patient with aphasia and unable to follow commands on exam. Of note, patient tachycardic with HR in the 140-160s. Following CT, patient with improvement - now able to state his name and age and follow commands. HR now in the 90s. No acute stroke treatment at this time. Patient remains in the tPA window until 1530 should symptoms worsen. Bedside handoff with ED RN Hope.

## 2018-04-01 NOTE — Progress Notes (Signed)
Pt going down for MRI and right before taking TELE off Pt had 9 beats of VT. Paging MD now.

## 2018-04-01 NOTE — ED Notes (Signed)
Pt's speech/cognition improving greatly. Neurology at bedside. Pt answering questions more appropriately, following commands now.

## 2018-04-01 NOTE — Progress Notes (Signed)
Received a call from MRI. Pt had a fall in the MRI. Went down for safety huddle. No injuries noted. VSS. Pt is alert and oriented and aware of what happened. RN asked Pt if he wanted someone to be notified. He stated no. Paged MD K. Schorr before going down.

## 2018-04-01 NOTE — ED Notes (Signed)
EEG at bedside.

## 2018-04-01 NOTE — Progress Notes (Signed)
Post Fall VS.   04/01/18 2127  Vitals  BP (!) 149/69  BP Location Right Arm  BP Method Automatic  Patient Position (if appropriate) Lying  Pulse Rate 64  Pulse Rate Source Monitor  Resp 20  Oxygen Therapy  SpO2 98 %  O2 Device Room Air

## 2018-04-01 NOTE — ED Notes (Signed)
Nanavatti, MD assessing the pt and per MD pt not to go to CT d/t no gag reflex and drooling of secretions, pt cleared to go to CT at 11:54 by Marla Roe, MD

## 2018-04-01 NOTE — Progress Notes (Signed)
EEG complete - results pending 

## 2018-04-01 NOTE — ED Triage Notes (Signed)
Pt in POV by family pt slumped over with report of stroke like symptoms starting 15 mins ago, pt brought straight to triage while registration put pt in system, Agricultural consultant and CT notified, Nanavati, MD came to triage to assess airway, pt drooling with no noted gag reflex, pt to go to room before transport to CT, Nanvati at bedside assessing pt, pt to CT with primary RN

## 2018-04-01 NOTE — H&P (Signed)
History and Physical    Brian [name] HCW:[id number] DOB: 02-05-1936 DOA: 04/01/2018  PCP: [name], MD Consultants:  [place]; [place] - endocrinology Patient coming from:  Home - lives alone; NOK: Son, brothers [name], [phone]  Chief Complaint:  Code stroke  HPI: Brian [name] is a 81 y.o. male with medical history significant of obesity; HTN; HLD; DM; and BPH presenting with  Code stroke.  He came to see his brother this morning.  Shortly after arriving, he got dizzy and laid down.  When he got up, he was dizzier.  By the time they got to the car, he was incoherent.  They were initially going to see PCP. BP was high, HR was 150-160.  Before they could get to the doctor, he became unresponsive.  His speech had been slurred prior to LOC.  He did drool and his left side didn't seem to be working - his hand was locked and his leg wasn't working (but this is the side with the knee problem).  He was looking over to that side.  Symptoms lasted 35-40 minutes.  He vomited shortly after he came to.  After that, he became responsive again and his vital signs immediately improved.  No h/o seizure.  He had one prior episode where [place] - but that day he did not eat or take his medication.  Of note, he did not take his medication this AM - he usually takes it around lunch time.  No bowel or bladder incontinence.  In April, he was prescribed medications for his knee pain - including Naproxen and Ultram.   ED Course:  CVA vs. Seizures.  Acute onset of dizziness, slurred speech, disorientation.  Twitching on the left, left-sided gaze, no incontinence.  Neurology has seen, suspects seizure.  CTA negative.  Patient is now talking normally.  Review of Systems: As per HPI; otherwise review of systems reviewed and negative.   Ambulatory Status:  Ambulates without assistance  Past Medical History:  Diagnosis Date  . ALLERGIC RHINITIS 10/13/2007  . ANEMIA,  CHRONIC DISEASE NEC 03/31/2007  . BENIGN PROSTATIC HYPERTROPHY 03/31/2007  . Chest pain    Stress echo, normal, December, 2012  . Chronic anemia   . DIABETES MELLITUS, TYPE II 03/31/2007  . [place] DISEASE, LUMBAR 03/31/2007  . ED (erectile dysfunction)   . Ejection fraction    EF  normal, stress echo, December,  2012  . History of prostatitis   . HYPERLIPIDEMIA 03/31/2007  . HYPERTENSION 03/31/2007  . Nephrolithiasis 12/07/2010  . Obesity   . POLYP, ANAL AND RECTAL 03/31/2007  . Right eye trauma    hx as child    Past Surgical History:  Procedure Laterality Date  . [place] SURGERY     1999  . PROSTATE BIOPSY     s/p  . RECTAL POLYPECTOMY     Transanal excision 10/2005  . SKIN BIOPSY     s/p right upper back 2009- benign Dr. Tonia Brooms    Social History   Socioeconomic History  . Marital status: Married    Spouse name: Not on file  . Number of children: 2  . Years of education: Not on file  . Highest education level: Not on file  Occupational History  . Occupation: Acupuncturist    Comment: Retired 1999  Social Needs  . Financial resource strain: Not on file  . Food insecurity:    Worry: Not on  file    Inability: Not on file  . Transportation needs:    Medical: Not on file    Non-medical: Not on file  Tobacco Use  . Smoking status: Former Smoker    Years: 10.00    Last attempt to quit: 1995    Years since quitting: 24.5  . Smokeless tobacco: Never Used  Substance and Sexual Activity  . Alcohol use: Yes    Comment: occasional  . Drug use: No  . Sexual activity: Not on file  Lifestyle  . Physical activity:    Days per week: Not on file    Minutes per session: Not on file  . Stress: Not on file  Relationships  . Social connections:    Talks on phone: Not on file    Gets together: Not on file    Attends religious service: Not on file    Active member of club or organization: Not on file    Attends meetings of clubs or organizations: Not on file     Relationship status: Not on file  . Intimate partner violence:    Fear of current or ex partner: Not on file    Emotionally abused: Not on file    Physically abused: Not on file    Forced sexual activity: Not on file  Other Topics Concern  . Not on file  Social History Narrative  . Not on file    No Known Allergies  Family History  Problem Relation Age of Onset  . Colon polyps Mother   . CVA Neg Hx   . Seizures Neg Hx     Prior to Admission medications   Medication Sig Start Date End Date Taking? Authorizing Provider  ACCU-CHEK SOFTCLIX LANCETS lancets Use as instructed twice per day Dx Code E11.9 12/19/17   Philemon Kingdom, MD  aspirin 81 MG EC tablet Take 81 mg by mouth daily.      [provider]  fluticasone (FLONASE) 50 MCG/ACT nasal spray Place 2 sprays into both nostrils daily. 01/12/16   [name], MD  gabapentin (NEURONTIN) 100 MG capsule Take 1 capsule (100 mg total) by mouth 3 (three) times daily. 01/30/18   Rosemarie Ax, MD  glucose blood (ACCU-CHEK AVIVA) test strip Use as instructed twice per day Dx E11.9 12/19/17   Philemon Kingdom, MD  metFORMIN (GLUCOPHAGE-XR) 500 MG 24 hr tablet Take 2 tablets (1,000 mg total) by mouth daily with supper. 12/19/17   Philemon Kingdom, MD  naproxen (NAPROSYN) 500 MG tablet Take 1 tablet (500 mg total) by mouth 2 (two) times daily as needed. 12/03/17 12/03/18  Rosemarie Ax, MD  olmesartan (BENICAR) 40 MG tablet Take 1 tablet (40 mg total) by mouth daily. 12/16/17   Rosemarie Ax, MD  Olopatadine HCl (PATADAY) 0.2 % SOLN 1 drop each eye daily 01/12/16   [name], MD  sitaGLIPtin (JANUVIA) 100 MG tablet Take 1 tablet (100 mg total) by mouth daily. 05/10/17   Philemon Kingdom, MD  traMADol (ULTRAM) 50 MG tablet Take 1 tablet (50 mg total) by mouth every 8 (eight) hours as needed. 12/30/17   [name], MD    Physical Exam: Vitals:   04/01/18 1345 04/01/18 1501 04/01/18 1530 04/01/18 1621  BP: 138/85 (!)  167/95 (!) 161/88 (!) 174/79  Pulse: 89 81 72 (!) 56  Resp: 19 17 (!) 22 18  Temp:    98 F (36.7 C)  TempSrc:    Oral  SpO2: 99%  98% 96% 100%  Weight:    90.9 kg (200 lb 6.4 oz)  Height:    6\' 1"  (1.854 m)     General:  Appears calm and comfortable and is NAD Eyes:  PERRL, EOMI, normal lids, iris ENT:  grossly normal hearing, lips & tongue, mmm Neck:  no LAD, masses or thyromegaly; no carotid bruits Cardiovascular:  RRR, no m/r/g. No LE edema.  Respiratory:   CTA bilaterally with no wheezes/rales/rhonchi.  Normal respiratory effort. Abdomen:  soft, NT, ND, NABS Back:   normal alignment, no CVAT Skin:  no rash or induration seen on limited exam Musculoskeletal:  grossly normal tone BUE/BLE, good ROM, no bony abnormality Lower extremity:  No LE edema.  Limited foot exam with no ulcerations.  2+ distal pulses. Psychiatric:  grossly normal mood and affect, speech fluent and appropriate, AOx3.  Mild cognitive deficit while trying to describe NSAIDs given by sports medicine, which family reports is chronic. Neurologic:  CN 2-12 grossly intact, moves all extremities in coordinated fashion, sensation intact    Radiological Exams on Admission: Ct Angio Head W Or Wo Contrast  Result Date: 04/01/2018 CLINICAL DATA:  Acute presentation with slurred speech and weakness. EXAM: CT ANGIOGRAPHY HEAD AND NECK TECHNIQUE: Multidetector CT imaging of the head and neck was performed using the standard protocol during bolus administration of intravenous contrast. Multiplanar CT image reconstructions and MIPs were obtained to evaluate the vascular anatomy. Carotid stenosis measurements (when applicable) are obtained utilizing NASCET criteria, using the distal internal carotid diameter as the denominator. CONTRAST:  97mL ISOVUE-370 IOPAMIDOL (ISOVUE-370) INJECTION 76% COMPARISON:  Head CT earlier same day. FINDINGS: CTA NECK FINDINGS Aortic arch: Aortic atherosclerosis. No aneurysm or dissection. Branching  pattern of the brachiocephalic vessels from the arch is normal without origin stenosis. Right carotid system: Common carotid artery widely patent to the bifurcation. Atherosclerotic plaque of the ICA bulb with mild irregularity but no measurable stenosis. ICA tortuous but widely patent beyond that. Left carotid system: Common carotid artery widely patent to the bifurcation. There is motion degradation at the bifurcation, but I do not suspect the presence of stenosis or irregularity. Mild plaque in the ICA bulb. ICA tortuous but widely patent beyond that. Vertebral arteries: Left vertebral artery is dominant. Both vertebral artery origins are patent. No stenosis on the left. There is calcified plaque at the origin on the right with 50% stenosis. Skeleton: Ordinary cervical spondylosis. Other neck: No mass or lymphadenopathy. Upper chest: Normal Review of the MIP images confirms the above findings CTA HEAD FINDINGS Anterior circulation: Both internal carotid arteries are patent through the skull base and siphon regions. Mild atherosclerotic calcification in the distal carotid siphon and supraclinoid ICA regions but no stenosis more than about 30%. The anterior and middle cerebral vessels are patent proximally. On the right, there is atherosclerotic narrowing of the M2 and M3 branches but no occluded or missing branches identified. On the left, there is a 70% stenosis of the distal M1 segment. Beyond that, the M2 and M3 branches show some atherosclerotic irregularity but I do not identify an occluded branch. Posterior circulation: Both vertebral arteries widely patent to the basilar. No basilar stenosis. Posterior circulation branch vessels are patent. Mild atherosclerotic irregularity of the more distal PCA branches. Venous sinuses: Patent and normal. Anatomic variants: None significant. Delayed phase: No abnormal enhancement. Review of the MIP images confirms the above findings IMPRESSION: Mild atherosclerotic change  at both carotid bifurcations. Slight irregularity of the ICA bulb on the right,  but without measurable stenosis. Intracranial medium vessel atherosclerotic change. 70% stenosis of the distal M1 segment on the left. I do not identify any occluded branches. 50% stenosis of the right vertebral artery origin. Electronically Signed   By: Nelson Chimes M.D.   On: 04/01/2018 12:48   Ct Angio Neck W Or Wo Contrast  Result Date: 04/01/2018 CLINICAL DATA:  Acute presentation with slurred speech and weakness. EXAM: CT ANGIOGRAPHY HEAD AND NECK TECHNIQUE: Multidetector CT imaging of the head and neck was performed using the standard protocol during bolus administration of intravenous contrast. Multiplanar CT image reconstructions and MIPs were obtained to evaluate the vascular anatomy. Carotid stenosis measurements (when applicable) are obtained utilizing NASCET criteria, using the distal internal carotid diameter as the denominator. CONTRAST:  35mL ISOVUE-370 IOPAMIDOL (ISOVUE-370) INJECTION 76% COMPARISON:  Head CT earlier same day. FINDINGS: CTA NECK FINDINGS Aortic arch: Aortic atherosclerosis. No aneurysm or dissection. Branching pattern of the brachiocephalic vessels from the arch is normal without origin stenosis. Right carotid system: Common carotid artery widely patent to the bifurcation. Atherosclerotic plaque of the ICA bulb with mild irregularity but no measurable stenosis. ICA tortuous but widely patent beyond that. Left carotid system: Common carotid artery widely patent to the bifurcation. There is motion degradation at the bifurcation, but I do not suspect the presence of stenosis or irregularity. Mild plaque in the ICA bulb. ICA tortuous but widely patent beyond that. Vertebral arteries: Left vertebral artery is dominant. Both vertebral artery origins are patent. No stenosis on the left. There is calcified plaque at the origin on the right with 50% stenosis. Skeleton: Ordinary cervical spondylosis. Other  neck: No mass or lymphadenopathy. Upper chest: Normal Review of the MIP images confirms the above findings CTA HEAD FINDINGS Anterior circulation: Both internal carotid arteries are patent through the skull base and siphon regions. Mild atherosclerotic calcification in the distal carotid siphon and supraclinoid ICA regions but no stenosis more than about 30%. The anterior and middle cerebral vessels are patent proximally. On the right, there is atherosclerotic narrowing of the M2 and M3 branches but no occluded or missing branches identified. On the left, there is a 70% stenosis of the distal M1 segment. Beyond that, the M2 and M3 branches show some atherosclerotic irregularity but I do not identify an occluded branch. Posterior circulation: Both vertebral arteries widely patent to the basilar. No basilar stenosis. Posterior circulation branch vessels are patent. Mild atherosclerotic irregularity of the more distal PCA branches. Venous sinuses: Patent and normal. Anatomic variants: None significant. Delayed phase: No abnormal enhancement. Review of the MIP images confirms the above findings IMPRESSION: Mild atherosclerotic change at both carotid bifurcations. Slight irregularity of the ICA bulb on the right, but without measurable stenosis. Intracranial medium vessel atherosclerotic change. 70% stenosis of the distal M1 segment on the left. I do not identify any occluded branches. 50% stenosis of the right vertebral artery origin. Electronically Signed   By: Nelson Chimes M.D.   On: 04/01/2018 12:48   Ct Head Code Stroke Wo Contrast  Result Date: 04/01/2018 CLINICAL DATA:  Code stroke. 82 year old male with acute onset stroke-like symptoms, slurred speech, confusion and decreased mental status. EXAM: CT HEAD WITHOUT CONTRAST TECHNIQUE: Contiguous axial images were obtained from the base of the skull through the vertex without intravenous contrast. COMPARISON:  None. FINDINGS: Brain: Patchy bilateral cerebral  white matter hypodensity. Chronic appearing small right cerebellar infarct best seen on coronal image 56. No midline shift, ventriculomegaly, mass effect, evidence of mass lesion,  intracranial hemorrhage or evidence of cortically based acute infarction. No cerebral cortical encephalomalacia identified. Vascular: Mild Calcified atherosclerosis at the skull base. No suspicious intracranial vascular hyperdensity. Skull: Negative. Sinuses/Orbits: Visualized paranasal sinuses and mastoids are well pneumatized. Other: Postoperative changes to both globes. No acute orbit or scalp soft tissue findings. ASPECTS [facility] Stroke Program Early CT Score) - Ganglionic level infarction (caudate, lentiform nuclei, internal capsule, insula, M1-M3 cortex): 7 - Supraganglionic infarction (M4-M6 cortex): 3 Total score (0-10 with 10 being normal): 10 IMPRESSION: 1. No acute cortically based infarct or intracranial hemorrhage. ASPECTS is 10. 2. Small chronic appearing right cerebellar infarct and moderate for age cerebral white matter disease. 3. These results were communicated to Dr. Rory Percy at 12:20 [place] 7/16/2019by text page via the [facility] messaging system. Electronically Signed   By: Genevie Ann M.D.   On: 04/01/2018 12:20    EKG: Independently reviewed.  NSR with rate 99; LAFB, LVH; nonspecific ST changes with no evidence of acute ischemia   Labs on Admission: I have personally reviewed the available labs and imaging studies at the time of the admission.  Pertinent labs:   Glucose 278 BUN 18/Creatinine 1.67/GFR 37; 14/1.02/90 in 11/18 Essentially negative CBC Troponin 0.01 INR 1.01  Assessment/Plan Principal Problem:   Altered mental status Active Problems:   Hypertension   Uncontrolled type 2 diabetes mellitus with circulatory disorder causing erectile dysfunction (HCC)   Acute renal failure (ARF) (HCC)   Altered mental status -Patient presenting with AMS initially concerning for CVA -Upon further questioning of  the family, he had tonic activity of the RUE with left-sided gaze - more c/w seizure -Further history reveals that he was started on tramadol about 3 months ago; this medication is known to lower the seizure threshold -Finally, and most telling, the patient recovered extremely quickly once the episode resolved and he returned to his full baseline - which is not consistent with a stroke -Strongly suspect seizure -Patient placed in observation overnight for further evaluation -Neurology has consulted -EEG was performed and negative; however, this does not r/o seizure -He also will likely need driving restriction for at least 6 months -Currently, there is no plan to load with anti-epileptic medication; if he seizes again, he is likely to need this -Seizure precautions -Ativan prn -Will also order MRI, although CVA seems much less likely  Acute renal failure -Baseline creatinine is 1.   -Today's creatinine is 1.67. -Likely due to prerenal failure secondary to dehydration and continuation of ARB, Glucophage and NSAIDs use. -Hold nephrotoxic medications for now -IVF  -Follow up renal function by BMP  HTN -Hold Benicar for renal dysfunction as well as permissive HTN (although unlikely)  DM -April A1c was 6.4, indicating good control -hold Glucophage and Januvia -Cover with moderate-scale SSI    DVT prophylaxis:  Lovenox Code Status:  Full - confirmed with patient/family Family Communication: Brothers present throughout evaluation  Disposition Plan:  Home once clinically improved Consults called: Neurology  Admission status: It is my clinical opinion that referral for OBSERVATION is reasonable and necessary in this patient based on the above information provided. The aforementioned taken together are felt to place the patient at high risk for further clinical deterioration. However it is anticipated that the patient may be medically stable for discharge from the hospital within 24 to 48  hours.    Karmen Bongo MD [facility]  If note is complete, please contact covering daytime or nighttime physician. [email or web address] Password [facility]  04/01/2018, 5:37 PM

## 2018-04-02 ENCOUNTER — Observation Stay (HOSPITAL_BASED_OUTPATIENT_CLINIC_OR_DEPARTMENT_OTHER): Payer: Medicare HMO

## 2018-04-02 ENCOUNTER — Encounter (HOSPITAL_COMMUNITY): Payer: Self-pay | Admitting: Nurse Practitioner

## 2018-04-02 DIAGNOSIS — E1159 Type 2 diabetes mellitus with other circulatory complications: Secondary | ICD-10-CM | POA: Diagnosis not present

## 2018-04-02 DIAGNOSIS — I639 Cerebral infarction, unspecified: Secondary | ICD-10-CM

## 2018-04-02 DIAGNOSIS — I6340 Cerebral infarction due to embolism of unspecified cerebral artery: Secondary | ICD-10-CM

## 2018-04-02 DIAGNOSIS — R4182 Altered mental status, unspecified: Secondary | ICD-10-CM | POA: Diagnosis not present

## 2018-04-02 DIAGNOSIS — I6310 Cerebral infarction due to embolism of unspecified precerebral artery: Secondary | ICD-10-CM | POA: Diagnosis not present

## 2018-04-02 DIAGNOSIS — I5030 Unspecified diastolic (congestive) heart failure: Secondary | ICD-10-CM | POA: Diagnosis not present

## 2018-04-02 DIAGNOSIS — N521 Erectile dysfunction due to diseases classified elsewhere: Secondary | ICD-10-CM | POA: Diagnosis not present

## 2018-04-02 DIAGNOSIS — R569 Unspecified convulsions: Secondary | ICD-10-CM | POA: Diagnosis not present

## 2018-04-02 DIAGNOSIS — E1165 Type 2 diabetes mellitus with hyperglycemia: Secondary | ICD-10-CM | POA: Diagnosis not present

## 2018-04-02 LAB — ECHOCARDIOGRAM COMPLETE
Height: 73 in
Weight: 3206.37 oz

## 2018-04-02 LAB — GLUCOSE, CAPILLARY
GLUCOSE-CAPILLARY: 171 mg/dL — AB (ref 70–99)
Glucose-Capillary: 231 mg/dL — ABNORMAL HIGH (ref 70–99)
Glucose-Capillary: 97 mg/dL (ref 70–99)

## 2018-04-02 LAB — LIPID PANEL
CHOL/HDL RATIO: 4.1 ratio
CHOLESTEROL: 227 mg/dL — AB (ref 0–200)
HDL: 56 mg/dL (ref >40)
LDL Cholesterol: 152 mg/dL — ABNORMAL HIGH (ref 0–99)
Triglycerides: 95 mg/dL (ref <150)
VLDL: 19 mg/dL (ref 0–40)

## 2018-04-02 MED ORDER — ASPIRIN 300 MG RE SUPP: 300.0000 mg | Freq: Every day | RECTAL | Status: DC

## 2018-04-02 MED ORDER — DICLOFENAC SODIUM 1 % TD GEL
2.0000 g | Freq: Four times a day (QID) | TRANSDERMAL | Status: DC | PRN
  Filled 2018-04-02: qty 100

## 2018-04-02 MED ORDER — STROKE: EARLY STAGES OF RECOVERY BOOK
Freq: Once | Status: AC
  Administered 2018-04-02: 12:00:00

## 2018-04-02 MED ORDER — ATORVASTATIN CALCIUM 80 MG PO TABS
80.0000 mg | ORAL_TABLET | Freq: Every day | ORAL | Status: DC
  Administered 2018-04-02: 80 mg via ORAL
  Filled 2018-04-02: qty 1

## 2018-04-02 MED ORDER — ATORVASTATIN CALCIUM 80 MG PO TABS: 80.0000 mg | ORAL_TABLET | Freq: Every day | ORAL | 0 refills | Status: DC

## 2018-04-02 MED ORDER — OLMESARTAN MEDOXOMIL 40 MG PO TABS: 20.0000 mg | ORAL_TABLET | Freq: Every day | ORAL | Status: DC

## 2018-04-02 MED ORDER — CLOPIDOGREL BISULFATE 75 MG PO TABS: 75.0000 mg | ORAL_TABLET | Freq: Every day | ORAL | 0 refills | Status: DC

## 2018-04-02 MED ORDER — ASPIRIN EC 81 MG PO TBEC: 81.0000 mg | DELAYED_RELEASE_TABLET | Freq: Every day | ORAL | Status: DC

## 2018-04-02 MED ORDER — CLOPIDOGREL BISULFATE 75 MG PO TABS
75.0000 mg | ORAL_TABLET | Freq: Every day | ORAL | Status: DC
Start: 2018-04-02 — End: 2018-04-02
  Administered 2018-04-02: 75 mg via ORAL
  Filled 2018-04-02: qty 1

## 2018-04-02 MED ORDER — ASPIRIN 81 MG PO TBEC: 81.0000 mg | DELAYED_RELEASE_TABLET | Freq: Every day | ORAL | 12 refills | Status: AC

## 2018-04-02 MED ORDER — ASPIRIN 325 MG PO TABS
325.0000 mg | ORAL_TABLET | Freq: Every day | ORAL | Status: DC
  Administered 2018-04-02: 325 mg via ORAL
  Filled 2018-04-02: qty 1

## 2018-04-02 NOTE — Care Management Note (Signed)
Case Management Note  Patient Details  Name: Brian [name] MRN: [id number] Date of Birth: 05-14-1936  Subjective/Objective:                Spoke w patient and family at bedside. He lives at home alone but has help from family as needed. He would like to use [facility] for [facility] services, referral placed to [facility]. Patient states he needs RW, order placed and requested for delivery to room 7/17. No other CM needs identified at this time.     Action/Plan:   Expected Discharge Date:                  Expected Discharge Plan:  [place]  In-House Referral:     Discharge planning Services  CM Consult  Post Acute Care Choice:  Home Health Choice offered to:  Patient  DME Arranged:  [place] rolling DME Agency:  [facility] [employer] [place]:  PT [facility] Agency:  [facility]  Status of Service:  Completed, signed off  If discussed at [place] of Stay Meetings, dates discussed:    Additional Comments:  Carles Collet, RN 04/02/2018, 5:00 PM

## 2018-04-02 NOTE — Evaluation (Signed)
Speech Language Pathology Evaluation Patient Details Name: Brian [name] MRN: [id number] DOB: 1936/06/05 Today's Date: 04/02/2018 Time: 0093-8182 SLP Time Calculation (min) (ACUTE ONLY): 48 min  Problem List:  Patient Active Problem List   Diagnosis Date Noted  . Cerebral embolism with cerebral infarction 04/02/2018  . Altered mental status 04/01/2018  . Acute renal failure (ARF) ([place]) 04/01/2018  . Right leg pain 12/04/2017  . Grief 12/04/2017  . Uncontrolled type 2 diabetes mellitus with circulatory disorder causing erectile dysfunction ([place]) 10/02/2016  . Allergic conjunctivitis 01/12/2016  . Abdominal pain, other specified site 06/23/2012  . Erectile dysfunction 12/13/2011  . Ejection fraction   . Chest pain   . Fatigue 06/14/2011  . Encounter for well adult exam with abnormal findings 06/10/2011  . Nephrolithiasis 12/07/2010  . Hypertension 12/07/2010  . SKIN LESION 03/01/2008  . ALLERGIC RHINITIS 10/13/2007  . Hyperlipidemia 03/31/2007  . Overweight (BMI 25.0-29.9) 03/31/2007  . ANEMIA, CHRONIC DISEASE NEC 03/31/2007  . POLYP, ANAL AND RECTAL 03/31/2007  . BENIGN PROSTATIC HYPERTROPHY 03/31/2007  . [place] DISEASE, LUMBAR 03/31/2007   Past Medical History:  Past Medical History:  Diagnosis Date  . ALLERGIC RHINITIS 10/13/2007  . ANEMIA, CHRONIC DISEASE NEC 03/31/2007  . BENIGN PROSTATIC HYPERTROPHY 03/31/2007  . Chest pain    Stress echo, normal, December, 2012  . Chronic anemia   . DIABETES MELLITUS, TYPE II 03/31/2007  . [place] DISEASE, LUMBAR 03/31/2007  . ED (erectile dysfunction)   . Ejection fraction    EF  normal, stress echo, December,  2012  . History of prostatitis   . HYPERLIPIDEMIA 03/31/2007  . HYPERTENSION 03/31/2007  . Nephrolithiasis 12/07/2010  . Obesity   . POLYP, ANAL AND RECTAL 03/31/2007  . Right eye trauma    hx as child   Past Surgical History:  Past Surgical History:  Procedure Laterality Date  . [place] SURGERY     1999  . PROSTATE  BIOPSY     s/p  . RECTAL POLYPECTOMY     Transanal excision 10/2005  . SKIN BIOPSY     s/p right upper back 2009- benign Dr. Tonia Brooms   HPI:  82 yo male adm to [facility] with dizziness, slurred speech and drooling from left side, left leg difficulty - code stroke called.    Pt found to have a right parietal cva.  PMH + for obesity,    Assessment / Plan / Recommendation Clinical Impression  MOCA 7.1 original version administered to Brian [name]. Score was 14/30 indicative of significant cognitive deficit.  Dyscalculia and spatial disorientation noted on testing but also significant attention and memory deficits.  Pt without language, speech or cranial nerve deficits impacting speech/swallowing.  Given he lives alone, he will benefit from follow up SLP to address cognitive linguistic skills and maximimize his independence.  Educated pt to findings/recommendations and encouraged him during process as he tends to down-play findings.  Will follow up in house.     SLP Assessment  SLP Recommendation/Assessment: Patient needs continued Speech Lanaguage Pathology Services SLP Visit Diagnosis: Cognitive communication deficit (R41.841)    Follow Up Recommendations  Home health SLP;Outpatient SLP(vs)    Frequency and Duration min 1 x/week  1 week      SLP Evaluation Cognition  Arousal/Alertness: Awake/alert Orientation Level: Oriented to person;Oriented to place;Oriented to situation;Disoriented to time Attention: Sustained Sustained Attention: Impaired Sustained Attention Impairment: Verbal complex Memory: Impaired Memory Impairment: Retrieval deficit;Storage deficit(0/5 words recalled without cue, 5/5 with multiple choice) Awareness: Impaired Awareness Impairment:  Intellectual impairment(poor awareness, "I don't use those things anymore since I'm retired") Problem Solving: Impaired(dyscalculia, spatial representation deficits) Problem Solving Impairment: Verbal basic Safety/Judgment: Impaired        Comprehension  Auditory Comprehension Overall Auditory Comprehension: Appears within functional limits for tasks assessed Yes/No Questions: Within Functional Limits Commands: Within Functional Limits Conversation: Simple Visual Recognition/Discrimination Discrimination: Within Function Limits Reading Comprehension Reading Status: Within funtional limits    Expression Expression Primary Mode of Expression: Verbal Verbal Expression Overall Verbal Expression: Appears within functional limits for tasks assessed Initiation: No impairment Level of Generative/Spontaneous Verbalization: Conversation Repetition: No impairment Naming: Impairment(2/3 animals correctly named) Pragmatics: No impairment Written Expression Dominant Hand: Right Written Expression: Exceptions to WFL(clock drawing pt required two attempts for correct hand placement)   Oral / Motor  Oral Motor/Sensory Function Overall Oral Motor/Sensory Function: Within functional limits Motor Speech Overall Motor Speech: Appears within functional limits for tasks assessed Respiration: Within functional limits Resonance: Within functional limits Articulation: Within functional limitis Intelligibility: Intelligible Motor Planning: Witnin functional limits   GO                    Brian [name] 04/02/2018, 8:41 AM   Luanna Salk, [place] [facility] SLP [phone]

## 2018-04-02 NOTE — Progress Notes (Signed)
*  Preliminary Results* Bilateral lower extremity venous duplex completed. Bilateral lower extremities are negative for deep vein thrombosis. There is no evidence of Baker's cyst bilaterally.  04/02/2018 9:24 AM  Brian [name] Dawna Part

## 2018-04-02 NOTE — Progress Notes (Signed)
Inpatient Diabetes Program Recommendations  AACE/ADA: New Consensus Statement on Inpatient Glycemic Control (2015)  Target Ranges:  Prepandial:   less than 140 mg/dL      Peak postprandial:   less than 180 mg/dL (1-2 hours)      Critically ill patients:  140 - 180 mg/dL   Lab Results  Component Value Date   GLUCAP 231 (H) 04/02/2018   HGBA1C 6.4 12/19/2017    Review of Glycemic ControlResults for Brian [name], Brian [name] (MRN [id number]) as of 04/02/2018 13:32  Ref. Range 04/01/2018 11:43 04/01/2018 16:21 04/01/2018 23:58 04/02/2018 06:30 04/02/2018 11:48  Glucose-Capillary Latest Ref Range: 70 - 99 mg/dL 232 (H) 176 (H) 200 (H) 171 (H) 231 (H)   Diabetes history: Type 2 DM  Outpatient Diabetes medications:  Metformin 1000 mg bid, Januvia 100 mg daily Current orders for Inpatient glycemic control:  Novolog moderate tid with meals and HS Inpatient Diabetes Program Recommendations:   Please consider adding Lantus 12 units daily while in the hospital.   Thanks,  Adah Perl, RN, BC-ADM Inpatient Diabetes Coordinator Pager [phone] (8a-5p)

## 2018-04-02 NOTE — Discharge Summary (Signed)
Physician Discharge Summary  Brian [name] PPI:[id number] DOB: 28-Feb-1936 DOA: 04/01/2018  PCP: [name], MD  Admit date: 04/01/2018 Discharge date: 04/02/2018  Admitted From: home Discharge disposition: home   Recommendations for Outpatient Follow-Up:   1. Referral made for 30 day event monitor please ensure it is done aspirin 81 mg and plavix 75 mg daily x 3 weeks, then plavix alone LFTs/FLP 6 weeks Home health No driving   Discharge Diagnosis:   Principal Problem:   Altered mental status Active Problems:   Hypertension   Uncontrolled type 2 diabetes mellitus with circulatory disorder causing erectile dysfunction (HCC)   Acute renal failure (ARF) (HCC)   Cerebral embolism with cerebral infarction    Discharge Condition: Improved.  Diet recommendation: Low sodium, heart healthy.  Carbohydrate-modified.  Regular.  Wound care: None.  Code status: Full.   History of Present Illness:   Brian [name] is a 82 y.o. male with medical history significant of obesity; HTN; HLD; DM; and BPH presenting with  Code stroke.  He came to see his brother this morning.  Shortly after arriving, he got dizzy and laid down.  When he got up, he was dizzier.  By the time they got to the car, he was incoherent.  They were initially going to see PCP. BP was high, HR was 150-160.  Before they could get to the doctor, he became unresponsive.  His speech had been slurred prior to LOC.  He did drool and his left side didn't seem to be working - his hand was locked and his leg wasn't working (but this is the side with the knee problem).  He was looking over to that side.  Symptoms lasted 35-40 minutes.  He vomited shortly after he came to.  After that, he became responsive again and his vital signs immediately improved.  No h/o seizure.  He had one prior episode [place] - but that day he did not eat or take his medication.  Of note, he did not take his medication  this AM - he usually takes it around lunch time.  No bowel or bladder incontinence.    Hospital Course by Problem:   Stroke:   punctate R parietal infarct, could be embolic though very tiny vs infarct secondary to small vessel disease source  CTA head & neck mild atherosclerosis B ICA bifurcations without sign stenosis. 70% L M1 stenosis. 50% R VA stenosis.  MRI  punctate R parietal infarct  2D Echo  EF 45-50%. No source of embolus. RA mildly dilated  Referral made for OP 30d monitoring to look for AF as possible source of stroke  LDL 152- statin added  HgbA1c 6.4   aspirin 81 mg and plavix 75 mg daily x 3 weeks, then plavix alone  Home health  Seizure  This is a first event  Not related to stroke  EEG neg   No AEDs at this time  Advised not to drive x 6 mos  Hypertension  Stable  Permissive hypertension (OK if < 220/120) but gradually normalize in 5-7 days  Long-term BP goal normotensive  Hyperlipidemia  Home meds:  No statin  LDL 152, goal < 70  Added lipitor 80  Continue statin at discharge  Diabetes type II  HgbA1c 6.4, at goal < 7.0  Advised to avoid simple sugars/sweets      Medical Consultants:      Discharge Exam:   Vitals:  04/02/18 1226 04/02/18 1559  BP: (!) 156/77 (!) 169/86  Pulse: 62 (!) 58  Resp:  20  Temp:    SpO2: 100% 99%   Vitals:   04/02/18 0808 04/02/18 0840 04/02/18 1226 04/02/18 1559  BP: (!) 168/72  (!) 156/77 (!) 169/86  Pulse: (!) 59  62 (!) 58  Resp: 20   20  Temp: (!) 97.3 F (36.3 C) 98 F (36.7 C)    TempSrc: Oral     SpO2: 96%  100% 99%  Weight:      Height:        General exam: Appears calm and comfortable.    The results of significant diagnostics from this hospitalization (including imaging, microbiology, ancillary and laboratory) are listed below for reference.     Procedures and Diagnostic Studies:   Ct Angio Head W Or [name] Contrast  Result Date: 04/01/2018 CLINICAL DATA:   Acute presentation with slurred speech and weakness. EXAM: CT ANGIOGRAPHY HEAD AND NECK TECHNIQUE: Multidetector CT imaging of the head and neck was performed using the standard protocol during bolus administration of intravenous contrast. Multiplanar CT image reconstructions and MIPs were obtained to evaluate the vascular anatomy. Carotid stenosis measurements (when applicable) are obtained utilizing NASCET criteria, using the distal internal carotid diameter as the denominator. CONTRAST:  39mL ISOVUE-370 IOPAMIDOL (ISOVUE-370) INJECTION 76% COMPARISON:  Head CT earlier same day. FINDINGS: CTA NECK FINDINGS Aortic arch: Aortic atherosclerosis. No aneurysm or dissection. Branching pattern of the brachiocephalic vessels from the arch is normal without origin stenosis. Right carotid system: Common carotid artery widely patent to the bifurcation. Atherosclerotic plaque of the ICA bulb with mild irregularity but no measurable stenosis. ICA tortuous but widely patent beyond that. Left carotid system: Common carotid artery widely patent to the bifurcation. There is motion degradation at the bifurcation, but I do not suspect the presence of stenosis or irregularity. Mild plaque in the ICA bulb. ICA tortuous but widely patent beyond that. Vertebral arteries: Left vertebral artery is dominant. Both vertebral artery origins are patent. No stenosis on the left. There is calcified plaque at the origin on the right with 50% stenosis. Skeleton: Ordinary cervical spondylosis. Other neck: No mass or lymphadenopathy. Upper chest: Normal Review of the MIP images confirms the above findings CTA HEAD FINDINGS Anterior circulation: Both internal carotid arteries are patent through the skull base and siphon regions. Mild atherosclerotic calcification in the distal carotid siphon and supraclinoid ICA regions but no stenosis more than about 30%. The anterior and middle cerebral vessels are patent proximally. On the right, there is  atherosclerotic narrowing of the M2 and M3 branches but no occluded or missing branches identified. On the left, there is a 70% stenosis of the distal M1 segment. Beyond that, the M2 and M3 branches show some atherosclerotic irregularity but I do not identify an occluded branch. Posterior circulation: Both vertebral arteries widely patent to the basilar. No basilar stenosis. Posterior circulation branch vessels are patent. Mild atherosclerotic irregularity of the more distal PCA branches. Venous sinuses: Patent and normal. Anatomic variants: None significant. Delayed phase: No abnormal enhancement. Review of the MIP images confirms the above findings IMPRESSION: Mild atherosclerotic change at both carotid bifurcations. Slight irregularity of the ICA bulb on the right, but without measurable stenosis. Intracranial medium vessel atherosclerotic change. 70% stenosis of the distal M1 segment on the left. I do not identify any occluded branches. 50% stenosis of the right vertebral artery origin. Electronically Signed   By: Jan Fireman.D.  On: 04/01/2018 12:48   Ct Angio Neck W Or [name] Contrast  Result Date: 04/01/2018 CLINICAL DATA:  Acute presentation with slurred speech and weakness. EXAM: CT ANGIOGRAPHY HEAD AND NECK TECHNIQUE: Multidetector CT imaging of the head and neck was performed using the standard protocol during bolus administration of intravenous contrast. Multiplanar CT image reconstructions and MIPs were obtained to evaluate the vascular anatomy. Carotid stenosis measurements (when applicable) are obtained utilizing NASCET criteria, using the distal internal carotid diameter as the denominator. CONTRAST:  33mL ISOVUE-370 IOPAMIDOL (ISOVUE-370) INJECTION 76% COMPARISON:  Head CT earlier same day. FINDINGS: CTA NECK FINDINGS Aortic arch: Aortic atherosclerosis. No aneurysm or dissection. Branching pattern of the brachiocephalic vessels from the arch is normal without origin stenosis. Right carotid  system: Common carotid artery widely patent to the bifurcation. Atherosclerotic plaque of the ICA bulb with mild irregularity but no measurable stenosis. ICA tortuous but widely patent beyond that. Left carotid system: Common carotid artery widely patent to the bifurcation. There is motion degradation at the bifurcation, but I do not suspect the presence of stenosis or irregularity. Mild plaque in the ICA bulb. ICA tortuous but widely patent beyond that. Vertebral arteries: Left vertebral artery is dominant. Both vertebral artery origins are patent. No stenosis on the left. There is calcified plaque at the origin on the right with 50% stenosis. Skeleton: Ordinary cervical spondylosis. Other neck: No mass or lymphadenopathy. Upper chest: Normal Review of the MIP images confirms the above findings CTA HEAD FINDINGS Anterior circulation: Both internal carotid arteries are patent through the skull base and siphon regions. Mild atherosclerotic calcification in the distal carotid siphon and supraclinoid ICA regions but no stenosis more than about 30%. The anterior and middle cerebral vessels are patent proximally. On the right, there is atherosclerotic narrowing of the M2 and M3 branches but no occluded or missing branches identified. On the left, there is a 70% stenosis of the distal M1 segment. Beyond that, the M2 and M3 branches show some atherosclerotic irregularity but I do not identify an occluded branch. Posterior circulation: Both vertebral arteries widely patent to the basilar. No basilar stenosis. Posterior circulation branch vessels are patent. Mild atherosclerotic irregularity of the more distal PCA branches. Venous sinuses: Patent and normal. Anatomic variants: None significant. Delayed phase: No abnormal enhancement. Review of the MIP images confirms the above findings IMPRESSION: Mild atherosclerotic change at both carotid bifurcations. Slight irregularity of the ICA bulb on the right, but without  measurable stenosis. Intracranial medium vessel atherosclerotic change. 70% stenosis of the distal M1 segment on the left. I do not identify any occluded branches. 50% stenosis of the right vertebral artery origin. Electronically Signed   By: Nelson Chimes M.D.   On: 04/01/2018 12:48   Brian [name] Contrast  Result Date: 04/01/2018 CLINICAL DATA:  Initial evaluation for acute slurred speech, confusion. EXAM: MRI HEAD WITHOUT CONTRAST TECHNIQUE: Multiplanar, multiecho pulse sequences of the brain and surrounding structures were obtained without intravenous contrast. COMPARISON:  Prior CT and CTA from earlier same day. FINDINGS: Brain: Age-related cerebral atrophy with moderate chronic small vessel ischemic disease. Few small remote right cerebellar infarcts. Punctate 5 mm focus of acute infarction present at the posterior right parietooccipital region (series 3, image 30). No associated hemorrhage or mass effect. No other evidence for acute or subacute ischemia. Gray-white matter differentiation otherwise maintained. No evidence for acute intracranial hemorrhage. No mass lesion, midline shift or mass effect. No hydrocephalus. No extra-axial fluid collection. Pituitary gland suprasellar region normal. Vascular:  Major intracranial vascular flow voids maintained at the skull base. Focus of susceptibility artifact at the right sylvian fissure (series 5, image 50), likely atherosclerotic plaque as no vessel occlusion seen within this region on prior CTA. Skull and upper cervical spine: Craniocervical junction within normal limits. No focal marrow replacing lesion. Scalp soft tissues unremarkable. Sinuses/Orbits: Globes and orbital soft tissues within normal limits. Patient status post ocular lens replacement on the right. Paranasal sinuses grossly clear. No mastoid effusion. Other: None. IMPRESSION: 1. 5 mm acute ischemic nonhemorrhagic subcortical infarct at the right parieto-occipital region, posterior right MCA  distribution. 2. Age-related cerebral atrophy with moderate chronic small vessel ischemic disease. Electronically Signed   By: Jeannine Boga M.D.   On: 04/01/2018 22:54   Ct Head Code Stroke [name] Contrast  Result Date: 04/01/2018 CLINICAL DATA:  Code stroke. 82 year old male with acute onset stroke-like symptoms, slurred speech, confusion and decreased mental status. EXAM: CT HEAD WITHOUT CONTRAST TECHNIQUE: Contiguous axial images were obtained from the base of the skull through the vertex without intravenous contrast. COMPARISON:  None. FINDINGS: Brain: Patchy bilateral cerebral white matter hypodensity. Chronic appearing small right cerebellar infarct best seen on coronal image 56. No midline shift, ventriculomegaly, mass effect, evidence of mass lesion, intracranial hemorrhage or evidence of cortically based acute infarction. No cerebral cortical encephalomalacia identified. Vascular: Mild Calcified atherosclerosis at the skull base. No suspicious intracranial vascular hyperdensity. Skull: Negative. Sinuses/Orbits: Visualized paranasal sinuses and mastoids are well pneumatized. Other: Postoperative changes to both globes. No acute orbit or scalp soft tissue findings. ASPECTS [facility] Stroke Program Early CT Score) - Ganglionic level infarction (caudate, lentiform nuclei, internal capsule, insula, M1-M3 cortex): 7 - Supraganglionic infarction (M4-M6 cortex): 3 Total score (0-10 with 10 being normal): 10 IMPRESSION: 1. No acute cortically based infarct or intracranial hemorrhage. ASPECTS is 10. 2. Small chronic appearing right cerebellar infarct and moderate for age cerebral white matter disease. 3. These results were communicated to Dr. Rory Percy at 12:20 [place] 7/16/2019by text page via the [facility] messaging system. Electronically Signed   By: Genevie Ann M.D.   On: 04/01/2018 12:20     Labs:   Basic Metabolic Panel: Recent Labs  Lab 04/01/18 1148 04/01/18 1251  NA 143 142  K 4.2 4.0  CL 105 104  CO2  27  --   GLUCOSE 278* 273*  BUN 18 23  CREATININE 1.67* 1.60*  CALCIUM 9.9  --    GFR Estimated Creatinine Clearance: 41.6 mL/min (A) (by C-G formula based on SCr of 1.6 mg/dL (H)). Liver Function Tests: Recent Labs  Lab 04/01/18 1148  AST 30  ALT 23  ALKPHOS 55  BILITOT 0.9  PROT 6.9  ALBUMIN 4.1   No results for input(s): LIPASE, AMYLASE in the last 168 hours. No results for input(s): AMMONIA in the last 168 hours. Coagulation profile Recent Labs  Lab 04/01/18 1148  INR 1.01    CBC: Recent Labs  Lab 04/01/18 1148 04/01/18 1251  WBC 5.4  --   NEUTROABS 2.7  --   HGB 13.1 14.6  HCT 44.9 43.0  MCV 88.2  --   PLT 228  --    Cardiac Enzymes: No results for input(s): CKTOTAL, CKMB, CKMBINDEX, TROPONINI in the last 168 hours. BNP: Invalid input(s): POCBNP CBG: Recent Labs  Lab 04/01/18 1621 04/01/18 2358 04/02/18 0630 04/02/18 1148 04/02/18 1557  GLUCAP 176* 200* 171* 231* 97   D-Dimer No results for input(s): DDIMER in the last 72 hours. Hgb A1c Recent Labs  04/02/18 1056  HGBA1C 6.4*   Lipid Profile Recent Labs    04/02/18 1056  CHOL 227*  HDL 56  LDLCALC 152*  TRIG 95  CHOLHDL 4.1   Thyroid function studies No results for input(s): TSH, T4TOTAL, T3FREE, THYROIDAB in the last 72 hours.  Invalid input(s): FREET3 Anemia work up No results for input(s): VITAMINB12, FOLATE, FERRITIN, TIBC, IRON, RETICCTPCT in the last 72 hours. Microbiology No results found for this or any previous visit (from the past 240 hour(s)).   Discharge Instructions:   Discharge Instructions    Ambulatory referral to Neurology   Complete by:  As directed    An appointment is requested in approximately: 4 weeks   Diet - low sodium heart healthy   Complete by:  As directed    Discharge instructions   Complete by:  As directed    aspirin 81 mg and plavix 75 mg daily x 3 weeks, then plavix alone No driving x 6 months Referral has been made for 30 day event  monitor Have given prescription for voltaren gel, if expensive, can buy OTC: ARNICARE GEL   Increase activity slowly   Complete by:  As directed      Allergies as of 04/02/2018   No Known Allergies     Medication List    STOP taking these medications   HYDROcodone-acetaminophen 5-325 MG tablet Commonly known as:  NORCO/VICODIN   Olopatadine HCl 0.2 % Soln Commonly known as:  PATADAY     TAKE these medications   ACCU-CHEK SOFTCLIX LANCETS lancets Use as instructed twice per day Dx Code E11.9   aspirin 81 MG EC tablet Take 1 tablet (81 mg total) by mouth daily for 21 days.   atorvastatin 80 MG tablet Commonly known as:  LIPITOR Take 1 tablet (80 mg total) by mouth daily at 6 PM.   clopidogrel 75 MG tablet Commonly known as:  PLAVIX Take 1 tablet (75 mg total) by mouth daily.   fluticasone 50 MCG/ACT nasal spray Commonly known as:  FLONASE Place 2 sprays into both nostrils daily. What changed:    when to take this  reasons to take this   gabapentin 100 MG capsule Commonly known as:  NEURONTIN Take 1 capsule (100 mg total) by mouth 3 (three) times daily.   glucose blood test strip Commonly known as:  ACCU-CHEK AVIVA Use as instructed twice per day Dx E11.9   metFORMIN 500 MG 24 hr tablet Commonly known as:  GLUCOPHAGE-XR Take 2 tablets (1,000 mg total) by mouth daily with supper.   olmesartan 40 MG tablet Commonly known as:  BENICAR Take 0.5 tablets (20 mg total) by mouth daily.   sitaGLIPtin 100 MG tablet Commonly known as:  JANUVIA Take 1 tablet (100 mg total) by mouth daily.      [facility] Follow up.   Why:  RW to be delivered to room prior to discharge Contact information: [address] [place] [address] [phone]        [facility] Follow up.   Specialty:  Home Health Services Why:  For home health services. They will contact you in 1-2 days to set up your first  home appointment.  Contact information: [place] [address] [phone]        [name], MD Follow up in 1 week(s).   Specialties:  Internal Medicine, Radiology Contact information: [place]  [facility] [place] [address] [phone]        [facility] .   Specialty:  Cardiology Contact information: [address] [id number] [place] [facility]. Schedule an appointment as soon as possible for a visit in 4 week(s).   Specialty:  Radiology Contact information: [address] [place] [phone]           Time coordinating discharge: 35 min  Signed:  Geradine Girt  [facility] 04/03/2018, 3:12 PM

## 2018-04-02 NOTE — Evaluation (Signed)
Physical Therapy Evaluation Patient Details Name: Brian [name] MRN: [id number] DOB: 10-15-1935 Today's Date: 04/02/2018   History of Present Illness  Pt is an 82 y/o male admitted secondary to L sided weakness and unresponsivemess. Imgaing revealed posterior R MCA infarct. Imaging negative for BLE DVT. PMH includes HTN, DM, lumbar surgery.   Clinical Impression  Pt admitted secondary to problem above with deficits below. Pt presenting with higher level cognitive deficits and very unaware of current deficits. Pt with LOB upon standing without AD, and pt reports that he did not lose his balance. Required mod A for steadying. Gross min guard for mobility using RW. Educated about importance of having assist at home and importance of using AD. Will continue to follow acutely to maximize functional mobility independence and safety.     Follow Up Recommendations Home health PT;Supervision/Assistance - 24 hour    Equipment Recommendations  Rolling walker with 5" wheels;3in1 (PT)    Recommendations for Other Services       Precautions / Restrictions Precautions Precautions: Fall Precaution Comments: Per notes, pt with fall during MRI. Per pt report, pt states he just "sat on the floor." Restrictions Weight Bearing Restrictions: No      Mobility  Bed Mobility Overal bed mobility: Modified Independent             General bed mobility comments: No physical assist needed.   Transfers Overall transfer level: Needs assistance Equipment used: None;Rolling walker (2 wheeled) Transfers: Sit to/from Stand Sit to Stand: Mod assist;Min guard         General transfer comment: Attempted standing without AD, however, pt with posterior LOB and required mod A for steadying. Had pt return to sitting and gave RW. Pt reports "I didn't almost fall, you pulled me down.". Min guard A for steadying assist to stand using RW.   Ambulation/Gait Ambulation/Gait assistance: Min guard Gait Distance  (Feet): 100 Feet Assistive device: Rolling walker (2 wheeled) Gait Pattern/deviations: Step-through pattern;Decreased stride length Gait velocity: Decreased    General Gait Details: Slow, mildly unsteady gait, however, no overt LOB noted with use of RW. Educated about importance of use of RW at home, however, states he could walk without it. Also educated brother about importance of use of DME.   Stairs            Wheelchair Mobility    Modified Rankin (Stroke Patients Only) Modified Rankin (Stroke Patients Only) Pre-Morbid Rankin Score: No significant disability Modified Rankin: Moderately severe disability     Balance Overall balance assessment: Needs assistance Sitting-balance support: No upper extremity supported;Feet supported Sitting balance-Leahy Scale: Good     Standing balance support: Bilateral upper extremity supported;During functional activity Standing balance-Leahy Scale: Poor Standing balance comment: Reliant on BUE support.                              Pertinent Vitals/Pain Pain Assessment: No/denies pain    Home Living Family/patient expects to be discharged to:: Private residence Living Arrangements: Alone Available Help at Discharge: Family;Available PRN/intermittently Type of Home: House Home Access: Stairs to enter Entrance Stairs-Rails: Right Entrance Stairs-Number of Steps: 2-3 Home Layout: Two level Home Equipment: Other (comment);Shower seat(walking stick )      Prior Function Level of Independence: Independent;Independent with assistive device(s)         Comments: Some conflicting information as pt reports he doesn't use anything, but does have a cane he keeps with him.  Hand Dominance   Dominant Hand: Right    Extremity/Trunk Assessment   Upper Extremity Assessment Upper Extremity Assessment: Defer to OT evaluation    Lower Extremity Assessment Lower Extremity Assessment: Overall WFL for tasks assessed     Cervical / Trunk Assessment Cervical / Trunk Assessment: Normal  Communication   Communication: No difficulties( )  Cognition Arousal/Alertness: Awake/alert Behavior During Therapy: WFL for tasks assessed/performed Overall Cognitive Status: Impaired/Different from baseline Area of Impairment: Orientation;Safety/judgement;Awareness;Problem solving;Memory                 Orientation Level: Disoriented to;Time   Memory: Decreased short-term memory;Decreased recall of precautions   Safety/Judgement: Decreased awareness of safety;Decreased awareness of deficits Awareness: Emergent Problem Solving: Slow processing;Decreased initiation;Requires verbal cues;Difficulty sequencing General Comments: Pt unable to recall the year and unable to recall what a cane was. Very unaware of deficits and safety at home.        General Comments General comments (skin integrity, edema, etc.): Pt's brother present during session. Educated about importance of supervision at home.     Exercises     Assessment/Plan    PT Assessment Patient needs continued PT services  PT Problem List Decreased balance;Decreased mobility;Decreased cognition;Decreased knowledge of use of DME;Decreased safety awareness;Decreased knowledge of precautions       PT Treatment Interventions DME instruction;Stair training;Gait training;Functional mobility training;Therapeutic activities;Balance training;Therapeutic exercise;Patient/family education;Cognitive remediation    PT Goals (Current goals can be found in the Care Plan section)  Acute Rehab PT Goals Patient Stated Goal: to go home today  PT Goal Formulation: With patient Time For Goal Achievement: 04/16/18 Potential to Achieve Goals: Good    Frequency Min 4X/week   Barriers to discharge        Co-evaluation               AM-PAC PT "6 Clicks" Daily Activity  Outcome Measure Difficulty turning over in bed (including adjusting bedclothes, sheets  and blankets)?: None Difficulty moving from lying on back to sitting on the side of the bed? : A Little Difficulty sitting down on and standing up from a chair with arms (e.g., wheelchair, bedside commode, etc,.)?: Unable Help needed moving to and from a bed to chair (including a wheelchair)?: A Little Help needed walking in hospital room?: A Little Help needed climbing 3-5 steps with a railing? : A Lot 6 Click Score: 16    End of Session Equipment Utilized During Treatment: Gait belt Activity Tolerance: Patient tolerated treatment well Patient left: in bed;with call bell/phone within reach;with bed alarm set;with family/visitor present Nurse Communication: Mobility status PT Visit Diagnosis: Unsteadiness on feet (R26.81);Difficulty in walking, not elsewhere classified (R26.2)    Time: 8185-6314 PT Time Calculation (min) (ACUTE ONLY): 23 min   Charges:   PT Evaluation $PT Eval Moderate Complexity: 1 Mod PT Treatments $Gait Training: 8-22 mins   PT G Codes:        Leighton Ruff, PT, DPT  Acute Rehabilitation Services  Pager: [phone]   Rudean Hitt 04/02/2018, 12:11 PM

## 2018-04-02 NOTE — Progress Notes (Signed)
  Echocardiogram 2D Echocardiogram has been performed.  Brian [name] 04/02/2018, 9:30 AM

## 2018-04-02 NOTE — Progress Notes (Addendum)
STROKE TEAM PROGRESS NOTE   INTERVAL HISTORY His family (sisters/bro) are at the bedside.  He states he feels back to normal. He did not feel having symptoms yesterday, though he remembers the details of his brother noticing sx and having him come to the hospital. He lives alone, has supportive family.  Vitals:   04/02/18 0454 04/02/18 0808 04/02/18 0840 04/02/18 1226  BP: (!) 143/75 (!) 168/72  (!) 156/77  Pulse: (!) 57 (!) 59  62  Resp: 16 20    Temp: 97.9 F (36.6 C) (!) 97.3 F (36.3 C) 98 F (36.7 C)   TempSrc: Oral Oral    SpO2: 97% 96%  100%  Weight:      Height:        CBC:  Recent Labs  Lab 04/01/18 1148 04/01/18 1251  WBC 5.4  --   NEUTROABS 2.7  --   HGB 13.1 14.6  HCT 44.9 43.0  MCV 88.2  --   PLT 228  --     Basic Metabolic Panel:  Recent Labs  Lab 04/01/18 1148 04/01/18 1251  NA 143 142  K 4.2 4.0  CL 105 104  CO2 27  --   GLUCOSE 278* 273*  BUN 18 23  CREATININE 1.67* 1.60*  CALCIUM 9.9  --    Lipid Panel:     Component Value Date/Time   CHOL 227 (H) 04/02/2018 1056   TRIG 95 04/02/2018 1056   HDL 56 04/02/2018 1056   CHOLHDL 4.1 04/02/2018 1056   VLDL 19 04/02/2018 1056   LDLCALC 152 (H) 04/02/2018 1056   HgbA1c:  Lab Results  Component Value Date   HGBA1C 6.4 12/19/2017   Urine Drug Screen:     Component Value Date/Time   LABOPIA NONE DETECTED 04/01/2018 1148   COCAINSCRNUR NONE DETECTED 04/01/2018 1148   LABBENZ NONE DETECTED 04/01/2018 1148   AMPHETMU NONE DETECTED 04/01/2018 1148   THCU NONE DETECTED 04/01/2018 1148   LABBARB (A) 04/01/2018 1148    Result not available. Reagent lot number recalled by manufacturer.    Alcohol Level No results found for: ETH  IMAGING Ct Angio Head W Or Wo Contrast  Result Date: 04/01/2018 CLINICAL DATA:  Acute presentation with slurred speech and weakness. EXAM: CT ANGIOGRAPHY HEAD AND NECK TECHNIQUE: Multidetector CT imaging of the head and neck was performed using the standard protocol  during bolus administration of intravenous contrast. Multiplanar CT image reconstructions and MIPs were obtained to evaluate the vascular anatomy. Carotid stenosis measurements (when applicable) are obtained utilizing NASCET criteria, using the distal internal carotid diameter as the denominator. CONTRAST:  26mL ISOVUE-370 IOPAMIDOL (ISOVUE-370) INJECTION 76% COMPARISON:  Head CT earlier same day. FINDINGS: CTA NECK FINDINGS Aortic arch: Aortic atherosclerosis. No aneurysm or dissection. Branching pattern of the brachiocephalic vessels from the arch is normal without origin stenosis. Right carotid system: Common carotid artery widely patent to the bifurcation. Atherosclerotic plaque of the ICA bulb with mild irregularity but no measurable stenosis. ICA tortuous but widely patent beyond that. Left carotid system: Common carotid artery widely patent to the bifurcation. There is motion degradation at the bifurcation, but I do not suspect the presence of stenosis or irregularity. Mild plaque in the ICA bulb. ICA tortuous but widely patent beyond that. Vertebral arteries: Left vertebral artery is dominant. Both vertebral artery origins are patent. No stenosis on the left. There is calcified plaque at the origin on the right with 50% stenosis. Skeleton: Ordinary cervical spondylosis. Other neck: No mass or  lymphadenopathy. Upper chest: Normal Review of the MIP images confirms the above findings CTA HEAD FINDINGS Anterior circulation: Both internal carotid arteries are patent through the skull base and siphon regions. Mild atherosclerotic calcification in the distal carotid siphon and supraclinoid ICA regions but no stenosis more than about 30%. The anterior and middle cerebral vessels are patent proximally. On the right, there is atherosclerotic narrowing of the M2 and M3 branches but no occluded or missing branches identified. On the left, there is a 70% stenosis of the distal M1 segment. Beyond that, the M2 and M3  branches show some atherosclerotic irregularity but I do not identify an occluded branch. Posterior circulation: Both vertebral arteries widely patent to the basilar. No basilar stenosis. Posterior circulation branch vessels are patent. Mild atherosclerotic irregularity of the more distal PCA branches. Venous sinuses: Patent and normal. Anatomic variants: None significant. Delayed phase: No abnormal enhancement. Review of the MIP images confirms the above findings IMPRESSION: Mild atherosclerotic change at both carotid bifurcations. Slight irregularity of the ICA bulb on the right, but without measurable stenosis. Intracranial medium vessel atherosclerotic change. 70% stenosis of the distal M1 segment on the left. I do not identify any occluded branches. 50% stenosis of the right vertebral artery origin. Electronically Signed   By: Nelson Chimes M.D.   On: 04/01/2018 12:48   Ct Angio Neck W Or Wo Contrast  Result Date: 04/01/2018 CLINICAL DATA:  Acute presentation with slurred speech and weakness. EXAM: CT ANGIOGRAPHY HEAD AND NECK TECHNIQUE: Multidetector CT imaging of the head and neck was performed using the standard protocol during bolus administration of intravenous contrast. Multiplanar CT image reconstructions and MIPs were obtained to evaluate the vascular anatomy. Carotid stenosis measurements (when applicable) are obtained utilizing NASCET criteria, using the distal internal carotid diameter as the denominator. CONTRAST:  73mL ISOVUE-370 IOPAMIDOL (ISOVUE-370) INJECTION 76% COMPARISON:  Head CT earlier same day. FINDINGS: CTA NECK FINDINGS Aortic arch: Aortic atherosclerosis. No aneurysm or dissection. Branching pattern of the brachiocephalic vessels from the arch is normal without origin stenosis. Right carotid system: Common carotid artery widely patent to the bifurcation. Atherosclerotic plaque of the ICA bulb with mild irregularity but no measurable stenosis. ICA tortuous but widely patent beyond  that. Left carotid system: Common carotid artery widely patent to the bifurcation. There is motion degradation at the bifurcation, but I do not suspect the presence of stenosis or irregularity. Mild plaque in the ICA bulb. ICA tortuous but widely patent beyond that. Vertebral arteries: Left vertebral artery is dominant. Both vertebral artery origins are patent. No stenosis on the left. There is calcified plaque at the origin on the right with 50% stenosis. Skeleton: Ordinary cervical spondylosis. Other neck: No mass or lymphadenopathy. Upper chest: Normal Review of the MIP images confirms the above findings CTA HEAD FINDINGS Anterior circulation: Both internal carotid arteries are patent through the skull base and siphon regions. Mild atherosclerotic calcification in the distal carotid siphon and supraclinoid ICA regions but no stenosis more than about 30%. The anterior and middle cerebral vessels are patent proximally. On the right, there is atherosclerotic narrowing of the M2 and M3 branches but no occluded or missing branches identified. On the left, there is a 70% stenosis of the distal M1 segment. Beyond that, the M2 and M3 branches show some atherosclerotic irregularity but I do not identify an occluded branch. Posterior circulation: Both vertebral arteries widely patent to the basilar. No basilar stenosis. Posterior circulation branch vessels are patent. Mild atherosclerotic irregularity of the  more distal PCA branches. Venous sinuses: Patent and normal. Anatomic variants: None significant. Delayed phase: No abnormal enhancement. Review of the MIP images confirms the above findings IMPRESSION: Mild atherosclerotic change at both carotid bifurcations. Slight irregularity of the ICA bulb on the right, but without measurable stenosis. Intracranial medium vessel atherosclerotic change. 70% stenosis of the distal M1 segment on the left. I do not identify any occluded branches. 50% stenosis of the right vertebral  artery origin. Electronically Signed   By: Nelson Chimes M.D.   On: 04/01/2018 12:48   Mr Brain Wo Contrast  Result Date: 04/01/2018 CLINICAL DATA:  Initial evaluation for acute slurred speech, confusion. EXAM: MRI HEAD WITHOUT CONTRAST TECHNIQUE: Multiplanar, multiecho pulse sequences of the brain and surrounding structures were obtained without intravenous contrast. COMPARISON:  Prior CT and CTA from earlier same day. FINDINGS: Brain: Age-related cerebral atrophy with moderate chronic small vessel ischemic disease. Few small remote right cerebellar infarcts. Punctate 5 mm focus of acute infarction present at the posterior right parietooccipital region (series 3, image 30). No associated hemorrhage or mass effect. No other evidence for acute or subacute ischemia. Gray-white matter differentiation otherwise maintained. No evidence for acute intracranial hemorrhage. No mass lesion, midline shift or mass effect. No hydrocephalus. No extra-axial fluid collection. Pituitary gland suprasellar region normal. Vascular: Major intracranial vascular flow voids maintained at the skull base. Focus of susceptibility artifact at the right sylvian fissure (series 5, image 50), likely atherosclerotic plaque as no vessel occlusion seen within this region on prior CTA. Skull and upper cervical spine: Craniocervical junction within normal limits. No focal marrow replacing lesion. Scalp soft tissues unremarkable. Sinuses/Orbits: Globes and orbital soft tissues within normal limits. Patient status post ocular lens replacement on the right. Paranasal sinuses grossly clear. No mastoid effusion. Other: None. IMPRESSION: 1. 5 mm acute ischemic nonhemorrhagic subcortical infarct at the right parieto-occipital region, posterior right MCA distribution. 2. Age-related cerebral atrophy with moderate chronic small vessel ischemic disease. Electronically Signed   By: Jeannine Boga M.D.   On: 04/01/2018 22:54   Ct Head Code Stroke Wo  Contrast  Result Date: 04/01/2018 CLINICAL DATA:  Code stroke. 82 year old male with acute onset stroke-like symptoms, slurred speech, confusion and decreased mental status. EXAM: CT HEAD WITHOUT CONTRAST TECHNIQUE: Contiguous axial images were obtained from the base of the skull through the vertex without intravenous contrast. COMPARISON:  None. FINDINGS: Brain: Patchy bilateral cerebral white matter hypodensity. Chronic appearing small right cerebellar infarct best seen on coronal image 56. No midline shift, ventriculomegaly, mass effect, evidence of mass lesion, intracranial hemorrhage or evidence of cortically based acute infarction. No cerebral cortical encephalomalacia identified. Vascular: Mild Calcified atherosclerosis at the skull base. No suspicious intracranial vascular hyperdensity. Skull: Negative. Sinuses/Orbits: Visualized paranasal sinuses and mastoids are well pneumatized. Other: Postoperative changes to both globes. No acute orbit or scalp soft tissue findings. ASPECTS [facility] Stroke Program Early CT Score) - Ganglionic level infarction (caudate, lentiform nuclei, internal capsule, insula, M1-M3 cortex): 7 - Supraganglionic infarction (M4-M6 cortex): 3 Total score (0-10 with 10 being normal): 10 IMPRESSION: 1. No acute cortically based infarct or intracranial hemorrhage. ASPECTS is 10. 2. Small chronic appearing right cerebellar infarct and moderate for age cerebral white matter disease. 3. These results were communicated to Dr. Rory Percy at 12:20 [place] 7/16/2019by text page via the [facility] messaging system. Electronically Signed   By: Genevie Ann M.D.   On: 04/01/2018 12:20    PHYSICAL EXAM General: Patient is awake, alert, in no acute distress.  CVS: S1-S2 heard regular rate rhythm Lungs: Clear to auscultation Neurological exam Patient is awake, oriented to person, place and situation. Knows month, year and recent holiday. Follows complex commands.  No aphasia, able to repeat No  dysarthria. Cranial nerves: Pupils equal round react to light, extra ocular movements intact, visual fields intact. face is symmetric, auditory acuity appears intact to conversational voice. Motor exam: normal, 5/5 all extremities. FMM intact.  Sensory exam: Intact bilaterally to lt tough. No extinction w/  Cerebellar: intact Gait steady without assistance   ASSESSMENT/PLAN Mr. CAN LUCCI is a 82 y.o. male with history of DB, HTN, anemia presenting with L arm stiffness, gaze deviation, garbled speech and drooling (seizure).   Stroke:   punctate R parietal infarct, could be embolic though very tiny vs infarct secondary to small vessel disease source  Code Stroke CT head No acute stroke. Small old R cerebellar infarct. Small vessel disease. ASPECTS 10.     CTA head & neck mild atherosclerosis B ICA bifurcations without sign stenosis. 70% L M1 stenosis. 50% R VA stenosis.  MRI  punctate R parietal infarct  2D Echo  EF 45-50%. No source of embolus. RA mildly dilated  Recommend OP 30d monitoring to look for AF as possible source of stroke  LDL 152  HgbA1c 6.4  Lovenox 40 mg sq daily for VTE prophylaxis  aspirin 81 mg daily prior to admission, now on aspirin 325 mg daily. Given  mild stroke, place on aspirin 81 mg and plavix 75 mg daily x 3 weeks, then plavix alone. Orders adjusted.   Therapy recommendations:  HH PT, [place] SLP  Disposition:  pending   Seizure  This is a first event  Not related to stroke  EEG neg   No AEDs at this time  Advised not to drive x 6 mos  Hypertension  Stable . Permissive hypertension (OK if < 220/120) but gradually normalize in 5-7 days . Long-term BP goal normotensive  Hyperlipidemia  Home meds:  No statin  LDL 152, goal < 70  Add lipitor 80  Continue statin at discharge  Diabetes type II  HgbA1c 6.4, at goal < 7.0  Advised to avoid simple sugars/sweets  Other Stroke Risk Factors  Advanced age  Former smoker  ETOH use,  occasional  Family hx stroke (mother)  Other Active Problems  ARF, Cr 1.67  Hospital day # 0  Burnetta Sabin, MSN, APRN, ANVP-BC, AGPCNP-BC Advanced Practice Stroke Nurse [place] for Schedule & Pager information 04/02/2018 2:28 PM   ATTENDING NOTE: I reviewed above note and agree with the assessment and plan. I have made any additions or clarifications directly to the above note. Pt was seen and examined.   82 year old male with history of BPH, DM, HLD, HTN presented with seizure-like activity with left arm stiff and left against, lasting for a few minutes.  Followed by speech difficulty and drooling.  Currently symptoms all resolved.  Patient back to baseline.  EEG negative for seizure no history of seizure.  CT head and neck showed left distal M1 70% stenosis in the right VA origin atherosclerosis.  MRI showed punctate right right occipital infarct and chronic right cerebellum infarct.  EF 45 to 50%.  No DVT.  A1c 5.4 and LDL 152.  His presentation concerning for seizure-like activity with postictal changes.  However no history of seizure and EEG negative.  However MRI showed right punctate infarct which cannot fully explain the seizure activity.  Hypoperfusion  less likely as it will cause more manifestation on the left hemisphere due to left M1 70% stenosis.  Etiology unclear at this moment, however the punctate right parietal occipital infarct could be due to small vessel disease versus embolic source.  Currently on aspirin and Plavix DAPT for 3 weeks and then Plavix alone.  Continue Lipitor.  Recommend 30-day cardio event monitoring as outpatient to rule out A. Fib.  Neurology will sign off. Please call with questions. Pt will follow up with stroke clinic NP at [facility] in about 4 weeks. Thanks for the consult.   Rosalin Hawking, MD PhD Stroke Neurology 04/03/2018 6:41 AM     To contact Stroke Continuity provider, please refer to [email or web address] After hours, contact  General Neurology

## 2018-04-02 NOTE — Progress Notes (Signed)
MRI reviewed and shows punctate right parietal infarct. Presentation and history consistent with a seizure. Given this infarct, it is likely that this is an embolic event and there was a probable cortical hypoperfusion or infarct that has not been seen on MRI that caused the seizure. I would recommend pursuing stroke risk factor work up. No AEDs for now. Stroke team to follow.  -- Amie Portland, MD [facility] Pager: [phone] If 7pm to 7am, please call on call as listed on AMION.

## 2018-04-03 ENCOUNTER — Ambulatory Visit: Payer: Medicare HMO | Admitting: Family Medicine

## 2018-04-03 ENCOUNTER — Other Ambulatory Visit: Payer: Self-pay | Admitting: Cardiology

## 2018-04-03 ENCOUNTER — Telehealth: Payer: Self-pay | Admitting: *Deleted

## 2018-04-03 DIAGNOSIS — I639 Cerebral infarction, unspecified: Secondary | ICD-10-CM

## 2018-04-03 LAB — HEMOGLOBIN A1C
HEMOGLOBIN A1C: 6.4 % — AB (ref 4.8–5.6)
MEAN PLASMA GLUCOSE: 137 mg/dL

## 2018-04-03 NOTE — Telephone Encounter (Signed)
Transition Care Management Follow-up Telephone Call   Date discharged? 04/02/17   How have you been since you were released from the hospital? Pt states he is doing alright   Do you understand why you were in the hospital? YES   Do you understand the discharge instructions? YES   Where were you discharged to? Home   Items Reviewed:  Medications reviewed: YES  Allergies reviewed: YES  Dietary changes reviewed: YES  Referrals reviewed: No referral needed   Functional Questionnaire:   Activities of Daily Living (ADLs):   He states he are independent in the following: bathing and hygiene, feeding, continence, grooming, toileting and dressing States they require assistance with the following: ambulation cane at times   Any transportation issues/concerns?: NO   Any patient concerns? NO   Confirmed importance and date/time of follow-up visits scheduled YES, appt 04/09/18  Provider Appointment booked with Dr. Jenny Reichmann   Confirmed with patient if condition begins to worsen call PCP or go to the ER.  Patient was given the office number and encouraged to call back with question or concerns.  : YES

## 2018-04-04 ENCOUNTER — Telehealth: Payer: Self-pay | Admitting: Internal Medicine

## 2018-04-04 DIAGNOSIS — N179 Acute kidney failure, unspecified: Secondary | ICD-10-CM | POA: Diagnosis not present

## 2018-04-04 DIAGNOSIS — I119 Hypertensive heart disease without heart failure: Secondary | ICD-10-CM | POA: Diagnosis not present

## 2018-04-04 DIAGNOSIS — I444 Left anterior fascicular block: Secondary | ICD-10-CM | POA: Diagnosis not present

## 2018-04-04 DIAGNOSIS — E1159 Type 2 diabetes mellitus with other circulatory complications: Secondary | ICD-10-CM | POA: Diagnosis not present

## 2018-04-04 DIAGNOSIS — I6930 Unspecified sequelae of cerebral infarction: Secondary | ICD-10-CM | POA: Diagnosis not present

## 2018-04-04 DIAGNOSIS — R569 Unspecified convulsions: Secondary | ICD-10-CM | POA: Diagnosis not present

## 2018-04-04 DIAGNOSIS — E785 Hyperlipidemia, unspecified: Secondary | ICD-10-CM | POA: Diagnosis not present

## 2018-04-04 DIAGNOSIS — E669 Obesity, unspecified: Secondary | ICD-10-CM | POA: Diagnosis not present

## 2018-04-04 DIAGNOSIS — Z7902 Long term (current) use of antithrombotics/antiplatelets: Secondary | ICD-10-CM | POA: Diagnosis not present

## 2018-04-04 DIAGNOSIS — D638 Anemia in other chronic diseases classified elsewhere: Secondary | ICD-10-CM | POA: Diagnosis not present

## 2018-04-04 NOTE — Telephone Encounter (Signed)
Needs visit for BP follow up today if possible or Saturday clinic.

## 2018-04-04 NOTE — Telephone Encounter (Signed)
Per chart pt call back and made appt for Sat clinic @ 9:00.Marland KitchenJohny [name]

## 2018-04-04 NOTE — Telephone Encounter (Signed)
MD is out of the office until 04/09/18. Pt has [place] f/u appt schedule for 04/09/18 as well. Will forward to another provider for recommendation. Pls advise.Marland KitchenJohny Chess

## 2018-04-04 NOTE — Telephone Encounter (Signed)
Copied from [place] [id number]. Topic: General - Other >> Apr 04, 2018 11:21 AM Keene Breath wrote: Reason for CRM: Leroy Kennedy with [facility] called to advise doctor that patient was just released yesterday from the hospital and today he has a BP of 210/100.  He was at the hospital for ischemic stroke and no BP meds were given.  [facility] will take the BP again in a few minutes but  would like advice on what to do next.  CB# [phone].

## 2018-04-04 NOTE — Telephone Encounter (Signed)
Called pt no answer LMOM w/MD response. Also called PT back also no answer LMOM w/MD response. Sent CRM to [facility] for FYI.Marland KitchenJohny Chess

## 2018-04-05 ENCOUNTER — Ambulatory Visit (INDEPENDENT_AMBULATORY_CARE_PROVIDER_SITE_OTHER): Payer: Medicare HMO | Admitting: Family Medicine

## 2018-04-05 ENCOUNTER — Encounter: Payer: Self-pay | Admitting: Family Medicine

## 2018-04-05 VITALS — BP 138/78 | HR 92 | Temp 97.7°F | Ht 73.0 in | Wt 205.0 lb

## 2018-04-05 DIAGNOSIS — R030 Elevated blood-pressure reading, without diagnosis of hypertension: Secondary | ICD-10-CM

## 2018-04-05 MED ORDER — OLMESARTAN MEDOXOMIL 40 MG PO TABS: 40.0000 mg | ORAL_TABLET | Freq: Every day | ORAL | Status: DC

## 2018-04-05 NOTE — Patient Instructions (Signed)
It was very nice to see you today!  Your blood pressure today looks great.  We will not make any medication changes today.  Please keep a close eye on your blood pressure.  If you have any readings above 180/100, please seek medical attention.  If you develop any symptoms such as chest pain, shortness of breath, weakness, numbness, dizziness, or any other symptoms, please seek medical care.  Please let your primary care doctor know if your blood pressure is persistently 140/90 or higher.  Please limit your salt intake, limit caffeine intake, and stay as active as possible.  Take care, Dr Jerline Pain

## 2018-04-05 NOTE — Progress Notes (Signed)
   Subjective:  Brian [name] is a 82 y.o. male who presents today for same-day appointment with a chief complaint of elevated blood pressure reading.   HPI:  Elevated Blood Pressure Reading, Acute problem Patient with history of essential hypertension.  He was recently hospitalized for seizure-like activity and stroke.  Yesterday his therapist found his blood pressure to be elevated in the 210s over 100s.  Denies any symptoms at this time.  No chest pain or shortness of breath.  No weakness or numbness.  No headache.  Patient's brother reports that he had just eaten at [employer] [employer] prior to this and thinks that this could have contributed.  They have not checked his blood pressure since then.  Blood pressures typically have been well controlled and at goal.  He is currently on olmesartan 40 mg daily.  ROS: Per HPI  PMH: He reports that he quit smoking about 24 years ago. He quit after 10.00 years of use. He has never used smokeless tobacco. He reports that he drinks alcohol. He reports that he does not use drugs.  Objective:  Physical Exam: BP 138/78 (BP Location: Right Arm, Patient Position: Sitting, Cuff Size: Normal)   Pulse 92   Temp 97.7 F (36.5 C) (Oral)   Ht 6\' 1"  (1.854 m)   Wt 205 lb (93 kg)   SpO2 97%   BMI 27.05 kg/m   Gen: NAD, resting comfortably CV: RRR with no murmurs appreciated Pulm: NWOB, CTAB with no crackles, wheezes, or rhonchi Neuro: Grossly normal, moves all extremities Psych: Normal affect and thought content  Assessment/Plan:  Elevated blood pressure Reading at goal today.  Likely elevated yesterday in setting of increased salt load from [place] [employer].  Does not have any concerning signs or symptoms.  We will continue his olmesartan 40 mg daily.  They will check his blood pressure at home with goal 140/90 or lower.  He has follow-up with his primary care doctor in 4 days.  Discussed reasons to return to care including chest pain, shortness of breath,  weakness, numbness, or any other neurological symptoms.  Discussed importance of low salt diet and regular exercise.   Time Spent: I spent >15 minutes face-to-face with the patient, with more than half spent on counseling for management for his elevated BP.   Algis Greenhouse. Jerline Pain, MD 04/05/2018 9:33 AM

## 2018-04-07 ENCOUNTER — Other Ambulatory Visit: Payer: Self-pay | Admitting: *Deleted

## 2018-04-07 ENCOUNTER — Telehealth: Payer: Self-pay

## 2018-04-07 ENCOUNTER — Telehealth: Payer: Self-pay | Admitting: Internal Medicine

## 2018-04-07 DIAGNOSIS — R569 Unspecified convulsions: Secondary | ICD-10-CM | POA: Diagnosis not present

## 2018-04-07 DIAGNOSIS — E1159 Type 2 diabetes mellitus with other circulatory complications: Secondary | ICD-10-CM | POA: Diagnosis not present

## 2018-04-07 DIAGNOSIS — I444 Left anterior fascicular block: Secondary | ICD-10-CM | POA: Diagnosis not present

## 2018-04-07 DIAGNOSIS — Z7902 Long term (current) use of antithrombotics/antiplatelets: Secondary | ICD-10-CM | POA: Diagnosis not present

## 2018-04-07 DIAGNOSIS — E669 Obesity, unspecified: Secondary | ICD-10-CM | POA: Diagnosis not present

## 2018-04-07 DIAGNOSIS — I119 Hypertensive heart disease without heart failure: Secondary | ICD-10-CM | POA: Diagnosis not present

## 2018-04-07 DIAGNOSIS — D638 Anemia in other chronic diseases classified elsewhere: Secondary | ICD-10-CM | POA: Diagnosis not present

## 2018-04-07 DIAGNOSIS — E785 Hyperlipidemia, unspecified: Secondary | ICD-10-CM | POA: Diagnosis not present

## 2018-04-07 DIAGNOSIS — I6930 Unspecified sequelae of cerebral infarction: Secondary | ICD-10-CM | POA: Diagnosis not present

## 2018-04-07 DIAGNOSIS — N179 Acute kidney failure, unspecified: Secondary | ICD-10-CM | POA: Diagnosis not present

## 2018-04-07 NOTE — Telephone Encounter (Signed)
Verbal orders given via VM 

## 2018-04-07 NOTE — Patient Outreach (Signed)
[place] [facility] Care Management  04/07/2018  Brian [name] [facility] [birth year] [id number]   EMMI-       RED ON EMMI ALERT Day # 1 Date: 04/04/18 Red Alert Reason: scheduled a follow up appointment? No  Know how/when to take meds? No   Outreach attempt #1 was successful at his home number listed in EPIC Patient is able to verify HIPAA [facility] Care Management RN reviewed and addressed red alert with patient  Brian [name] confirms the EMMI responses are correct. He had not made his follow up appointment and at the time of the call he did not know when to take some of his medications He reports that since this weekend his 2 brothers worked with him to help him put his meds in a pill container and a calendar for his appointments that has helped "I feel much better about that now" He reports he had also asked his MD to make a calendar to make sure he is taking his medications on time.He reports his wife died about 2-3 months ago and she was assisting with this.  He reports having support of two brothers, one sister and sister in laws locally in [place]. He reports his brothers get together daily.  He reports his son lives in [place] and his daughter lives in [place] and visit as often as they can. The son visited the past weekend.    CM discussed pill packing/blister packaging programs and referred to his MD for assist Brian [name] confirms he has an appointment with Dr Marshall Cork on Wednesday April 09 2018 and he is scheduled to be seen today by Janett Billow from neurology office in his home at 1-1:30 pm Cm questioned if this was [facility] who Cm noted to be ordered  Brian [name] denied falls but discussed with Cm an episode during this last admission in which he had a lot of tubes/wires around him and he sat in the floor to untangle himself but was informed by nursing staff that he had fallen.  Cm discussed safety precautions at the hospital and his home  He has access to a walker and cane but  states he has not needed to use them since his 04/01/18 to 04/02/18  Hospitalization.  Transition of care assessment completed - He had questions about his low sodium heart healthy diet in which his deceased wife was managing.  He would like more educational information on what to eat via EMMI via his listed e mail address and in the mail. Brian [name] reports he is not very good at using the Internet but will have a friend available later in the day and his brothers daily to assist   Advised patient that there will be further automated EMMI- post discharge calls to assess how the patient is doing following the recent hospitalization Advised the patient that another call may be received from a nurse if any of their responses were abnormal. Patient voiced understanding and was appreciative of f/u call.  Plan: [name] RN CM will follow up with Brian [name] within 2 weeks  [facility] Cm provided Brian [name] with CM contact number and the 24 hour nurse line number [phone] when he reported he would be going to a family reunion in [place] with his brothers the first weekend in August 2019 He was informed not to drive for 6 months at d/c and verbalized understanding  [facility] Cm sent educational info via mail and e-mail on diabetic meal planning, low salt  diet, and diabetic diet [facility] RN Cm spoke with Tiffany at Dr Marshall Cork office to request MD/RN assist with pill packaging for Brian [name] via preferably [place]. Lavina Hamman, RN, BSN, CCM [facility] Telephonic Care Management Care Coordinator Direct number [phone]  Main [facility] number [phone] Fax number [phone]

## 2018-04-07 NOTE — Telephone Encounter (Signed)
Copied from [place] [id number]. Topic: General - Other >> Apr 07, 2018 12:39 PM Oneta Rack wrote: Osvaldo Human name: Joelene Millin  Relation to pt: [facility] Call back number: [phone]    Reason for call:  Patient interested in "pill packing" due to memory loss. [facility] recommended [facility] [address] # [address]oletha Grayer [place], [place] [address],  [phone], please advise

## 2018-04-07 NOTE — Telephone Encounter (Signed)
Copied from [place] [id number]. Topic: Quick Communication - See Telephone Encounter >> Apr 07, 2018  8:28 AM Robina Ade, Helene Kelp D wrote: CRM for notification. See Telephone encounter for: 04/07/18. Frankie with [facility] called requesting verbal order to continue seeing pt for home health and she wants the verbal order as follow: 1X for 1 week, 1X for 2 week, 1 week for 2X and also verbal order for speech therapy consultation. She can be reached at [phone], ok to leave message.

## 2018-04-08 MED ORDER — FLUTICASONE PROPIONATE 50 MCG/ACT NA SUSP: 2.0000 | Freq: Every day | NASAL | 5 refills | Status: DC | PRN

## 2018-04-08 MED ORDER — OLMESARTAN MEDOXOMIL 40 MG PO TABS: 40.0000 mg | ORAL_TABLET | Freq: Every day | ORAL | 2 refills | Status: DC

## 2018-04-08 MED ORDER — ATORVASTATIN CALCIUM 80 MG PO TABS: 80.0000 mg | ORAL_TABLET | Freq: Every day | ORAL | 2 refills | Status: DC

## 2018-04-08 MED ORDER — METFORMIN HCL ER 500 MG PO TB24: 1000.0000 mg | ORAL_TABLET | Freq: Every day | ORAL | 2 refills | Status: DC

## 2018-04-08 MED ORDER — GABAPENTIN 100 MG PO CAPS: 100.0000 mg | ORAL_CAPSULE | Freq: Three times a day (TID) | ORAL | 1 refills | Status: DC

## 2018-04-08 MED ORDER — SITAGLIPTIN PHOSPHATE 100 MG PO TABS: 100.0000 mg | ORAL_TABLET | Freq: Every day | ORAL | 2 refills | Status: DC

## 2018-04-08 MED ORDER — CLOPIDOGREL BISULFATE 75 MG PO TABS: 75.0000 mg | ORAL_TABLET | Freq: Every day | ORAL | 2 refills | Status: DC

## 2018-04-08 NOTE — Addendum Note (Signed)
Addended by: [name] on: 04/08/2018 02:27 PM   Modules accepted: Orders

## 2018-04-08 NOTE — Telephone Encounter (Signed)
Ok all meds done for pill [facility]

## 2018-04-09 ENCOUNTER — Encounter: Payer: Self-pay | Admitting: Internal Medicine

## 2018-04-09 ENCOUNTER — Ambulatory Visit (INDEPENDENT_AMBULATORY_CARE_PROVIDER_SITE_OTHER): Payer: Medicare HMO | Admitting: Internal Medicine

## 2018-04-09 VITALS — BP 179/96 | HR 77 | Temp 98.1°F | Ht 73.0 in | Wt 207.0 lb

## 2018-04-09 DIAGNOSIS — IMO0002 Reserved for concepts with insufficient information to code with codable children: Secondary | ICD-10-CM

## 2018-04-09 DIAGNOSIS — E785 Hyperlipidemia, unspecified: Secondary | ICD-10-CM | POA: Diagnosis not present

## 2018-04-09 DIAGNOSIS — E1159 Type 2 diabetes mellitus with other circulatory complications: Secondary | ICD-10-CM | POA: Diagnosis not present

## 2018-04-09 DIAGNOSIS — I10 Essential (primary) hypertension: Secondary | ICD-10-CM | POA: Diagnosis not present

## 2018-04-09 DIAGNOSIS — N521 Erectile dysfunction due to diseases classified elsewhere: Secondary | ICD-10-CM

## 2018-04-09 DIAGNOSIS — E1165 Type 2 diabetes mellitus with hyperglycemia: Secondary | ICD-10-CM

## 2018-04-09 DIAGNOSIS — N179 Acute kidney failure, unspecified: Secondary | ICD-10-CM

## 2018-04-09 DIAGNOSIS — I639 Cerebral infarction, unspecified: Secondary | ICD-10-CM

## 2018-04-09 DIAGNOSIS — Z8673 Personal history of transient ischemic attack (TIA), and cerebral infarction without residual deficits: Secondary | ICD-10-CM

## 2018-04-09 NOTE — Assessment & Plan Note (Signed)
For f/u lab as ordered,  to f/u any worsening symptoms or concerns

## 2018-04-09 NOTE — Assessment & Plan Note (Signed)
Lab Results  Component Value Date   HGBA1C 6.4 (H) 04/02/2018  stable overall by history and exam, recent data reviewed with pt, and pt to continue medical treatment as before,  to f/u any worsening symptoms or concerns

## 2018-04-09 NOTE — Assessment & Plan Note (Signed)
stable overall by history and exam, recent data reviewed with pt, and pt to continue medical treatment as before,  to f/u any worsening symptoms or concerns  

## 2018-04-09 NOTE — Progress Notes (Signed)
Subjective:    Patient ID: Brian [name], male    DOB: Jan 19, 1936, 82 y.o.   MRN: [id number]   HPI     Here to f/u post hospn for acute CVA with acute onset dizziness and confusion; went to ED with elevated BP and HR 150's. Became unresponsive after noted slurred speech and drooling and left side not moving as usual, all lasting about 40 minutes.  Did vomit x 1 after regained consciousness and VS improved; felt to be siezure, first event, not related to stroke, EEG negative and AED not started, advised no driving x 6 mo. Also:  CTA head & neckmildatherosclerosisB ICA bifurcations without sign stenosis. 70% L M1 stenosis. 50% R VA stenosis.  MRIpunctate R parietal infarct  2D EchoEF45-50%. No source of embolus. RA mildly dilated  Referral made for OP 30d monitoring to look for AF as possible source of stroke  LDL152- statin added  HgbA1c6.4   aspirin 81 mg and plavix 75 mg daily x 3 weeks, then [facility] ordered  Pt denies new neurological symptoms such as new headache, or facial or extremity weakness or numbness BP overall stable to slight elevated at d/c, now improved.   Pt denies polydipsia, polyuria.  Lipitor 80 mg started.  Pt has no new complaints today.  Has cardiac event monitor start tomorrow (he and brother are unaware of the appt but told today).  Also noted with AKI without further evaluation.  Brother notes pt did just return from long car trip and was not drinking fluids as per usual.   Past Medical History:  Diagnosis Date  . ALLERGIC RHINITIS 10/13/2007  . ANEMIA, CHRONIC DISEASE NEC 03/31/2007  . BENIGN PROSTATIC HYPERTROPHY 03/31/2007  . Chest pain    Stress echo, normal, December, 2012  . Chronic anemia   . DIABETES MELLITUS, TYPE II 03/31/2007  . [place] DISEASE, LUMBAR 03/31/2007  . ED (erectile dysfunction)   . Ejection fraction    EF  normal, stress echo, December,  2012  . History of prostatitis   . HYPERLIPIDEMIA 03/31/2007  .  HYPERTENSION 03/31/2007  . Nephrolithiasis 12/07/2010  . Obesity   . POLYP, ANAL AND RECTAL 03/31/2007  . Right eye trauma    hx as child   Past Surgical History:  Procedure Laterality Date  . [place] SURGERY     1999  . PROSTATE BIOPSY     s/p  . RECTAL POLYPECTOMY     Transanal excision 10/2005  . SKIN BIOPSY     s/p right upper back 2009- benign Dr. Tonia Brooms    reports that he quit smoking about 24 years ago. He quit after 10.00 years of use. He has never used smokeless tobacco. He reports that he drinks alcohol. He reports that he does not use drugs. family history includes Colon polyps in his mother; Stroke in his mother. No Known Allergies Current Outpatient Medications on File Prior to Visit  Medication Sig Dispense Refill  . ACCU-CHEK SOFTCLIX LANCETS lancets Use as instructed twice per day Dx Code E11.9 200 each 3  . aspirin 81 MG EC tablet Take 1 tablet (81 mg total) by mouth daily for 21 days. 30 tablet 12  . atorvastatin (LIPITOR) 80 MG tablet Take 1 tablet (80 mg total) by mouth daily at 6 PM. 90 tablet 2  . clopidogrel (PLAVIX) 75 MG tablet Take 1 tablet (75 mg total) by mouth daily. 90 tablet 2  . fluticasone (FLONASE) 50 MCG/ACT nasal spray Place  2 sprays into both nostrils daily as needed. 16 g 5  . gabapentin (NEURONTIN) 100 MG capsule Take 1 capsule (100 mg total) by mouth 3 (three) times daily. 270 capsule 1  . glucose blood (ACCU-CHEK AVIVA) test strip Use as instructed twice per day Dx E11.9 200 each 3  . metFORMIN (GLUCOPHAGE-XR) 500 MG 24 hr tablet Take 2 tablets (1,000 mg total) by mouth daily with supper. 180 tablet 2  . olmesartan (BENICAR) 40 MG tablet Take 1 tablet (40 mg total) by mouth daily. 90 tablet 2  . sitaGLIPtin (JANUVIA) 100 MG tablet Take 1 tablet (100 mg total) by mouth daily. 90 tablet 2   No current facility-administered medications on file prior to visit.   ROS:  Constitutional: Negative for other unusual diaphoresis or sweats HENT:  Negative for ear discharge or swelling Eyes: Negative for other worsening visual disturbances Respiratory: Negative for stridor or other swelling  Gastrointestinal: Negative for worsening distension or other blood Genitourinary: Negative for retention or other urinary change Musculoskeletal: Negative for other MSK pain or swelling Skin: Negative for color change or other new lesions Neurological: Negative for worsening tremors and other numbness  Psychiatric/Behavioral: Negative for worsening agitation or other fatigue All other system neg per pt    Objective:   Physical Exam BP (!) 179/96 Comment: left arm  Pulse 77   Temp 98.1 F (36.7 C) (Oral)   Ht 6\' 1"  (1.854 m)   Wt 207 lb (93.9 kg)   SpO2 98%   BMI 27.31 kg/m  VS noted,  Constitutional: Pt appears in NAD HENT: Head: NCAT.  Right Ear: External ear normal.  Left Ear: External ear normal.  Eyes: . Pupils are equal, round, and reactive to light. Conjunctivae and EOM are normal Nose: without d/c or deformity Neck: Neck supple. Gross normal ROM Cardiovascular: Normal rate and regular rhythm.   Pulmonary/Chest: Effort normal and breath sounds without rales or wheezing.  Abd:  Soft, NT, ND, + BS, no organomegaly Neurological: Pt is alert. At baseline orientation, motor grossly intact Skin: Skin is warm. No rashes, other new lesions, no LE edema Psychiatric: Pt behavior is normal without agitation  No other exam findings     Assessment & Plan:

## 2018-04-09 NOTE — Patient Instructions (Addendum)
Please continue all other medications as before, and refills have been done if requested.  Please have the pharmacy call with any other refills you may need.  Please continue your efforts at being more active, low cholesterol diet, and weight control.  You are otherwise up to date with prevention measures today.  Please keep your appointments with your specialists as you may have planned - home Physical therapy, and Neurology 7.26  You will be contacted regarding the referral for: cardiac event monitor - already scheduled for tomorrow at 11 AM - (see list below)  Please go to the LAB in the Basement (turn left off the elevator) for the tests to be done in 4 weeks at your convenience  You will be contacted by phone if any changes need to be made immediately.  Otherwise, you will receive a letter about your results with an explanation, but please check with MyChart first.

## 2018-04-09 NOTE — Assessment & Plan Note (Signed)
Stable, cont home PT, cont asa/plavix then asa as planned after 3 wks, f/u neurology as planned, for 30 day event monitor to be placed tomorrow to further r/o arrythmia

## 2018-04-09 NOTE — Assessment & Plan Note (Signed)
To cont statin new start high dose lipitor

## 2018-04-10 ENCOUNTER — Other Ambulatory Visit: Payer: Self-pay | Admitting: Cardiology

## 2018-04-10 ENCOUNTER — Ambulatory Visit (INDEPENDENT_AMBULATORY_CARE_PROVIDER_SITE_OTHER): Payer: Medicare HMO

## 2018-04-10 DIAGNOSIS — I4891 Unspecified atrial fibrillation: Secondary | ICD-10-CM | POA: Diagnosis not present

## 2018-04-10 DIAGNOSIS — I639 Cerebral infarction, unspecified: Secondary | ICD-10-CM

## 2018-04-14 DIAGNOSIS — Z7902 Long term (current) use of antithrombotics/antiplatelets: Secondary | ICD-10-CM | POA: Diagnosis not present

## 2018-04-14 DIAGNOSIS — N179 Acute kidney failure, unspecified: Secondary | ICD-10-CM | POA: Diagnosis not present

## 2018-04-14 DIAGNOSIS — E669 Obesity, unspecified: Secondary | ICD-10-CM | POA: Diagnosis not present

## 2018-04-14 DIAGNOSIS — I444 Left anterior fascicular block: Secondary | ICD-10-CM | POA: Diagnosis not present

## 2018-04-14 DIAGNOSIS — D638 Anemia in other chronic diseases classified elsewhere: Secondary | ICD-10-CM | POA: Diagnosis not present

## 2018-04-14 DIAGNOSIS — I119 Hypertensive heart disease without heart failure: Secondary | ICD-10-CM | POA: Diagnosis not present

## 2018-04-14 DIAGNOSIS — R569 Unspecified convulsions: Secondary | ICD-10-CM | POA: Diagnosis not present

## 2018-04-14 DIAGNOSIS — E1159 Type 2 diabetes mellitus with other circulatory complications: Secondary | ICD-10-CM | POA: Diagnosis not present

## 2018-04-14 DIAGNOSIS — Z7984 Long term (current) use of oral hypoglycemic drugs: Secondary | ICD-10-CM

## 2018-04-14 DIAGNOSIS — E785 Hyperlipidemia, unspecified: Secondary | ICD-10-CM | POA: Diagnosis not present

## 2018-04-14 DIAGNOSIS — Z87891 Personal history of nicotine dependence: Secondary | ICD-10-CM

## 2018-04-14 DIAGNOSIS — I6930 Unspecified sequelae of cerebral infarction: Secondary | ICD-10-CM | POA: Diagnosis not present

## 2018-04-17 ENCOUNTER — Other Ambulatory Visit: Payer: Self-pay | Admitting: *Deleted

## 2018-04-17 NOTE — Patient Outreach (Signed)
[place] [facility]) Care Management  04/17/2018  Brian [name] 1936/08/18 [id number]   EMMI-       RED ON EMMI ALERT- stroke Day # 13 Date: 04/16/18 Red Alert Reason: Questions/problems with meds? Yes    Outreach attempt #1 successful at the listed home number  Patient is able to verify HIPAA Brian [name] Management RN reviewed and addressed red alert with patient Mr Brian [name] confirms the response yes to questions/problems with meds? Was incorrect.  He denies concerns with taking medications as prescribed, affording medications, side effects of medications and questions about medications.He updated CM that he and his brother, Brian [name], are now preferring to get together weekly to fill his 7 day pill container and are doing well with this  When questioned about follow up and any immediate concerns, he discussed having a 05/06/18 appointment with neurology and his letter reports he needs to bring in film for MRI/CT, etc.  CM and Mr Brian [name] discussed options for this to include having his primary MD or [facility] medical records send these items.  During the interaction CM called the neurology office to confirm with Diane that the office has access to get all Mr Brian [name]' imaging items from Epic and he will not have to bring in these items. He voiced appreciation for services rendered  Mr Brian [name] discussed that his family members will be to a reunion soon that he will not attend but he has made arrangements with his brother to take him to the 05/06/18 appointment and hopes this does not change.  CM discussed referral to [facility] to get transportation alternate options but Mr Brian [name] prefers to await to re discuss this when [facility] CM follows up with him the week prior to this appointment.  Advised patient that there will be further automated EMMI- post discharge calls to assess how the patient is doing following the recent hospitalization Advised the patient that another call may be received from a nurse if  any of their responses were abnormal. Patient voiced understanding and was appreciative of f/u call.  Plan: [facility] RN CM will follow up with Mr Brian [name] in 7-14 days and plan for case closure of stroke EMMI case   Brian Millin L. Lavina Hamman, RN, BSN, CCM [facility] Telephonic Care Management Care Coordinator Direct number [phone]  Main [facility] number [phone] Fax number [phone]

## 2018-04-21 ENCOUNTER — Ambulatory Visit: Payer: Self-pay | Admitting: *Deleted

## 2018-04-23 ENCOUNTER — Telehealth: Payer: Self-pay | Admitting: Internal Medicine

## 2018-04-23 NOTE — Telephone Encounter (Signed)
Call received from [facility] with [facility] regarding an abnormal EKG.  EKG strip received.  Reviewed by DOD Dr. Marlou Porch.   Per Dr. Marlou Porch- "Not aflutter.  No afib.  PAT or SVT"  Will forward to Dr. Jenny Reichmann.

## 2018-04-23 NOTE — Telephone Encounter (Signed)
OK to let pt know there was found elevated HR on the monitor that could be a type or rhythm abnormality, but is not one associated with stroke.  We can refer him to cardiology if thinks the symptoms are enough to need this.

## 2018-04-23 NOTE — Telephone Encounter (Signed)
New Message    Zigmund Daniel with [employer] is calling with abnormal EKG

## 2018-04-24 NOTE — Telephone Encounter (Signed)
Pt has been informed and expressed understanding.  

## 2018-04-30 NOTE — Patient Outreach (Signed)
[place] [facility]) Care Management  04/30/2018  Caley Volkert Marchesi [birth year] [id number]   EMMI-     2 week follow up call and case closure                                                           RED ON EMMI ALERT- stroke Day # 13 Date: 04/16/18 Red Alert Reason: Questions/problems with meds? Yes    Outreach attempt #2 successful at the listed home number  Patient is able to verify HIPAA [place] Management RN reviewed the follow up call reasonswith patient   Mr Hubert reports that both of his brothers are working him on his medication containers and is he doing very well with taking his medications as ordered  He spoke with his brother about taking him to his 05/06/18 appointment and transportation has been arranged.   Mr Anstead will call back if this changes for assistance prn   He reports he feels "wonderful" and is excited about  Getting his cardiac monitor off next week on 05/10/18   Advised patient that there will be further automated EMMI-post discharge calls to assess how the patient is doing following the recent hospitalization Advised the patient that another call may be received from a nurse if any of their responses were abnormal. Patient voiced understanding and was appreciative of f/u call.  Plan: [facility] RN CM will close case at this time as patient has been assessed and no needs identified.    [facility] RN CM provided CM contact number again per Mr [facility] request   [name], RN, BSN, CCM [facility] Telephonic Care Management Care Coordinator Direct number [phone]  Main [facility] number [phone] Fax number [phone]

## 2018-05-01 ENCOUNTER — Other Ambulatory Visit: Payer: Self-pay | Admitting: *Deleted

## 2018-05-06 ENCOUNTER — Other Ambulatory Visit: Payer: Self-pay | Admitting: Internal Medicine

## 2018-05-06 ENCOUNTER — Ambulatory Visit: Payer: Medicare HMO | Admitting: Adult Health

## 2018-05-06 ENCOUNTER — Telehealth: Payer: Self-pay | Admitting: Internal Medicine

## 2018-05-06 ENCOUNTER — Encounter: Payer: Self-pay | Admitting: Adult Health

## 2018-05-06 VITALS — BP 147/60 | HR 84 | Ht 73.0 in | Wt 208.6 lb

## 2018-05-06 DIAGNOSIS — N521 Erectile dysfunction due to diseases classified elsewhere: Secondary | ICD-10-CM | POA: Diagnosis not present

## 2018-05-06 DIAGNOSIS — I639 Cerebral infarction, unspecified: Secondary | ICD-10-CM | POA: Diagnosis not present

## 2018-05-06 DIAGNOSIS — E785 Hyperlipidemia, unspecified: Secondary | ICD-10-CM | POA: Diagnosis not present

## 2018-05-06 DIAGNOSIS — E1165 Type 2 diabetes mellitus with hyperglycemia: Secondary | ICD-10-CM | POA: Diagnosis not present

## 2018-05-06 DIAGNOSIS — E1159 Type 2 diabetes mellitus with other circulatory complications: Secondary | ICD-10-CM

## 2018-05-06 DIAGNOSIS — I10 Essential (primary) hypertension: Secondary | ICD-10-CM | POA: Diagnosis not present

## 2018-05-06 DIAGNOSIS — IMO0002 Reserved for concepts with insufficient information to code with codable children: Secondary | ICD-10-CM

## 2018-05-06 MED ORDER — LOSARTAN POTASSIUM 100 MG PO TABS: 100.0000 mg | ORAL_TABLET | Freq: Every day | ORAL | 3 refills | Status: DC

## 2018-05-06 NOTE — Telephone Encounter (Signed)
Called brother of the patient, Brian [name], regarding pharmacy preference. No answer, requested cal back.

## 2018-05-06 NOTE — Telephone Encounter (Signed)
Ok for change benicar to losartan 100 mg - done erx

## 2018-05-06 NOTE — Telephone Encounter (Signed)
Copied from [place] [id number]. Topic: General - Other >> May 06, 2018 11:06 AM Yvette Rack wrote: Reason for CRM: Pt brother [name] states the Rx for olmesartan (BENICAR) 40 MG tablet is on back order at the pharmacy and pt is in need of an alternate medication.

## 2018-05-06 NOTE — Progress Notes (Signed)
[facility] [address] [place]. [place] [address] [phone]       OFFICE FOLLOW UP NOTE  Mr. Owenn Rothermel Hacking Date of Birth:  [birth year] Medical Record Number:  [id number]   Reason for Referral:  hospital seizure activity and stroke follow up  CHIEF COMPLAINT:  Chief Complaint  Patient presents with  . Follow-up    Stroke and Seizure follow up from hospital room 9 pt with brother [name], pt has on cardiac monitor     HPI: Hartley Urton Schuknecht is being seen today for initial visit in the office for seizure activity and right occipital lobe infarct on 04/01/2018. History obtained from patient, brother and chart review. Reviewed all radiology images and labs personally.  Draedyn Gainer is a 82 year old male with history of BPH, DM, HLD, HTN who presented with seizure-like activity with left arm stiffness and left gaze preference, lasting for a few minutes which was ollowed by speech difficulty and drooling.  Symptoms resolved rapidly.  EEG negative for seizure activity and patient denied history of seizure.  CTA head and neck reviewed and showed left distal M1 70% stenosis in the right VA origin atherosclerosis.  MRI head reviewed and showed punctate right occipital infarct and chronic right cerebellum infarct.    2D echo showed an EF 45 to 50%.    Lower extremity Doppler was negative for DVT.  A1c 5.4 and LDL 152.  It was determined that his presentation was concerning for seizure-like activity with postictal changes.  However no history of seizure and EEG negative.  However MRI showed right punctate infarct which cannot fully explain the seizure activity.  Etiology unclear at this moment, however the punctate right parietal occipital infarct could be due to small vessel disease versus embolic source.    Recommended aspirin and Plavix DAPT for 3 weeks and then Plavix alone.    Also recommended continuation of Lipitor. Recommend 30-day cardio event monitoring as outpatient to rule out A.  Fib.  Patient was discharged home in satisfactory condition.  05/06/2018 visit: Since hospital discharge, patient has been doing well and is accompanied at today's visit by his brother.  Patient is currently wearing 30-day cardiac monitor and will be completed this coming Saturday.  He denies any seizure activity or stroke/TIA symptoms.  Brother believes that seizure was related to increased stress due to recent traveling along with not taking any of his medications due to traveling.  Continues on Plavix as he is completed 3-week of DAPT therapy and denies side effects of bleeding or bruising.  Continues to take Lipitor without side effects of myalgias.  Blood pressure today 147/60 and does monitor this at home with typical SBP 130s.  Does monitor BG's at home which have been stable.  Patient is planning on taking a trip to [place] to visit family members next month.  Denies new or worsening stroke/TIA symptoms.    ROS:   14 system review of systems performed and negative with exception of not enough sleep  PMH:  Past Medical History:  Diagnosis Date  . ALLERGIC RHINITIS 10/13/2007  . ANEMIA, CHRONIC DISEASE NEC 03/31/2007  . BENIGN PROSTATIC HYPERTROPHY 03/31/2007  . Chest pain    Stress echo, normal, December, 2012  . Chronic anemia   . DIABETES MELLITUS, TYPE II 03/31/2007  . [place] DISEASE, LUMBAR 03/31/2007  . ED (erectile dysfunction)   . Ejection fraction    EF  normal, stress echo, December,  2012  . History of prostatitis   .  HYPERLIPIDEMIA 03/31/2007  . HYPERTENSION 03/31/2007  . Nephrolithiasis 12/07/2010  . Obesity   . POLYP, ANAL AND RECTAL 03/31/2007  . Right eye trauma    hx as child  . Stroke [facility])     PSH:  Past Surgical History:  Procedure Laterality Date  . [place] SURGERY     1999  . PROSTATE BIOPSY     s/p  . RECTAL POLYPECTOMY     Transanal excision 10/2005  . SKIN BIOPSY     s/p right upper back 2009- benign Dr. Tonia Brooms    Social History:  Social History     Socioeconomic History  . Marital status: Married    Spouse name: Not on file  . Number of children: 2  . Years of education: Not on file  . Highest education level: Not on file  Occupational History  . Occupation: Acupuncturist    Comment: Retired 1999  Social Needs  . Financial resource strain: Not on file  . Food insecurity:    Worry: Not on file    Inability: Not on file  . Transportation needs:    Medical: Not on file    Non-medical: Not on file  Tobacco Use  . Smoking status: Former Smoker    Years: 10.00    Last attempt to quit: 1995    Years since quitting: 24.6  . Smokeless tobacco: Never Used  Substance and Sexual Activity  . Alcohol use: Yes    Alcohol/week: 1.0 standard drinks    Types: 1 Cans of beer per week    Comment: occasional or yearly  . Drug use: No  . Sexual activity: Not on file  Lifestyle  . Physical activity:    Days per week: Not on file    Minutes per session: Not on file  . Stress: Not on file  Relationships  . Social connections:    Talks on phone: Not on file    Gets together: Not on file    Attends religious service: Not on file    Active member of club or organization: Not on file    Attends meetings of clubs or organizations: Not on file    Relationship status: Not on file  . Intimate partner violence:    Fear of current or ex partner: Not on file    Emotionally abused: Not on file    Physically abused: Not on file    Forced sexual activity: Not on file  Other Topics Concern  . Not on file  Social History Narrative  . Not on file    Family History:  Family History  Problem Relation Age of Onset  . Colon polyps Mother   . Stroke Mother        due to ICA stenosis  . CVA Neg Hx   . Seizures Neg Hx     Medications:   Current Outpatient Medications on File Prior to Visit  Medication Sig Dispense Refill  . ACCU-CHEK SOFTCLIX LANCETS lancets Use as instructed twice per day Dx Code E11.9 200 each 3  . atorvastatin  (LIPITOR) 80 MG tablet Take 1 tablet (80 mg total) by mouth daily at 6 PM. 90 tablet 2  . clopidogrel (PLAVIX) 75 MG tablet Take 1 tablet (75 mg total) by mouth daily. 90 tablet 2  . fluticasone (FLONASE) 50 MCG/ACT nasal spray Place 2 sprays into both nostrils daily as needed. 16 g 5  . gabapentin (NEURONTIN) 100 MG capsule Take 1 capsule (100 mg total)  by mouth 3 (three) times daily. 270 capsule 1  . glucose blood (ACCU-CHEK AVIVA) test strip Use as instructed twice per day Dx E11.9 200 each 3  . metFORMIN (GLUCOPHAGE-XR) 500 MG 24 hr tablet Take 2 tablets (1,000 mg total) by mouth daily with supper. 180 tablet 2  . sitaGLIPtin (JANUVIA) 100 MG tablet Take 1 tablet (100 mg total) by mouth daily. 90 tablet 2   No current facility-administered medications on file prior to visit.     Allergies:  No Known Allergies   Physical Exam  Vitals:   05/06/18 1319  BP: (!) 147/60  Pulse: 84  Weight: 208 lb 9.6 oz (94.6 kg)  Height: 6\' 1"  (1.854 m)   Body mass index is 27.52 kg/m. No exam data present  General: well developed, well nourished, pleasant elderly [place] male, seated, in no evident distress Head: head normocephalic and atraumatic.   Neck: supple with no carotid or supraclavicular bruits Cardiovascular: regular rate and rhythm, no murmurs Musculoskeletal: no deformity Skin:  no rash/petichiae Vascular:  Normal pulses all extremities  Neurologic Exam Mental Status: Awake and fully alert. Oriented to place and time. Recent and remote memory intact. Attention span, concentration and fund of knowledge appropriate. Mood and affect appropriate.  Cranial Nerves: Fundoscopic exam reveals sharp disc margins. Pupils equal, briskly reactive to light. Extraocular movements full without nystagmus. Visual fields full to confrontation. Hearing intact. Facial sensation intact. Face, tongue, palate moves normally and symmetrically.  Motor: Normal bulk and tone. Normal strength in all tested  extremity muscles. Sensory.: intact to touch , pinprick , position and vibratory sensation.  Coordination: Rapid alternating movements normal in all extremities. Finger-to-nose and heel-to-shin performed accurately bilaterally. Gait and Station: Arises from chair without difficulty. Stance is normal. Gait demonstrates normal stride length and balance . Able to heel, toe and tandem walk without difficulty.  Reflexes: 1+ and symmetric. Toes downgoing.    NIHSS  0 Modified Rankin  0    Diagnostic Data (Labs, Imaging, Testing)  CT head without contrast 04/01/2018 IMPRESSION: 1. 5 mm acute ischemic nonhemorrhagic subcortical infarct at the right parieto-occipital region, posterior right MCA distribution. 2. Age-related cerebral atrophy with moderate chronic small vessel ischemic disease.  CTA head/neck with and without contrast 04/01/2018 IMPRESSION: Mild atherosclerotic change at both carotid bifurcations. Slight irregularity of the ICA bulb on the right, but without measurable stenosis. Intracranial medium vessel atherosclerotic change. 70% stenosis of the distal M1 segment on the left. I do not identify any occluded branches. 50% stenosis of the right vertebral artery origin.  MRI brain without contrast 04/01/2018 IMPRESSION: 1. 5 mm acute ischemic nonhemorrhagic subcortical infarct at the right parieto-occipital region, posterior right MCA distribution. 2. Age-related cerebral atrophy with moderate chronic small vessel ischemic disease.     ASSESSMENT: Brian [name] is a 82 y.o. year old male here with right parietal-occipital region infarct on 04/01/2018 secondary to small vessel disease versus cardioembolic source along with seizure activity. Vascular risk factors include DM, HTN and HLD.  Patient is being seen today for stroke follow-up and overall doing well without any further seizure episodes.    PLAN: -Continue clopidogrel 75 mg daily  and Lipitor for secondary  stroke prevention -F/u with PCP regarding your HLD, DM and HTN management -Advised if patient is planning trip to [place], to ensure there is a nearby ER in case of emergency -continue to monitor BP at home -Complete 30-day cardiac monitor -Maintain strict control of hypertension with blood pressure goal below  130/90, diabetes with hemoglobin A1c goal below 6.5% and cholesterol with LDL cholesterol (bad cholesterol) goal below 70 mg/dL. I also advised the patient to eat a healthy diet with plenty of whole grains, cereals, fruits and vegetables, exercise regularly and maintain ideal body weight.  Follow up in 3 months or call earlier if needed   Greater than 50% of time during this 25 minute visit was spent on counseling,explanation of diagnosis of right occipital region infarct, reviewing risk factor management of HLD, HTN and DM, planning of further management, discussion with patient and family and coordination of care    Venancio Poisson, [facility]  [facility] [address] [place], [place] [address]  Phone [phone] Fax [phone]

## 2018-05-06 NOTE — Patient Instructions (Signed)
Continue clopidogrel 75 mg daily  and lipitor  for secondary stroke prevention  Continue to follow up with PCP regarding cholesterol and blood pressure management   Continue to see endocrinologist for diabetic management  Complete 30 day cardiac monitor   Continue to monitor blood pressure at home  Maintain strict control of hypertension with blood pressure goal below 130/90, diabetes with hemoglobin A1c goal below 6.5% and cholesterol with LDL cholesterol (bad cholesterol) goal below 70 mg/dL. I also advised the patient to eat a healthy diet with plenty of whole grains, cereals, fruits and vegetables, exercise regularly and maintain ideal body weight.  Followup in the future with me in 3 months or call earlier if needed       Thank you for coming to see [place] at [facility]. I hope we have been able to provide you high quality care today.  You may receive a patient satisfaction survey over the next few weeks. We would appreciate your feedback and comments so that we may continue to improve ourselves and the health of our patients.

## 2018-05-06 NOTE — Telephone Encounter (Unsigned)
Copied from [id number]. Topic: Quick Communication - Rx Refill/Question >> May 06, 2018 11:03 AM Yvette Rack wrote: Medication: atorvastatin (LIPITOR) 80 MG tablet, olmesartan (BENICAR) 40 MG tablet, and clopidogrel (PLAVIX) 75 MG tablet  Has the patient contacted their pharmacy? yes   Preferred Pharmacy (with phone number or street name): [id number] [place], [place] [phone] (Phone) [phone] (Fax)   Agent: Please be advised that RX refills may take up to 3 business days. We ask that you follow-up with your pharmacy.

## 2018-05-06 NOTE — Telephone Encounter (Signed)
Attempted to call patient regarding his preferred pharmacy. No answer, left message for patient to give the office a call back.

## 2018-05-07 ENCOUNTER — Other Ambulatory Visit: Payer: Self-pay | Admitting: *Deleted

## 2018-05-07 NOTE — Patient Outreach (Signed)
[facility]) Care Management  05/07/2018  [name] Mathia [birth year] [id number]   Care coordination   [facility] RN CM received a message from [name] and returned his call  He reports receiving an automated call from "network" and not understanding the purpose of the call [facility] RN CM noted that the EMMI low salt diet article in [facility] manager was not able to send him information related to an incorrect e mail address [name] reports he has "poor computer skills" and his deceased wife previously assisted [facility] RN CM and [name] verified his correct e mail address (entered in [place] [facility] [place])  [name] voiced appreciation of CM sending EMMI information in the mail   Plan: Cm will updated [place], print the diabetes diet, diabetic meal planning and low salt diet information for [name] and send it to him in the [employer]. Lavina Hamman, RN, BSN, [place] Coordinator Office number [phone] Mobile number [phone]  Main [facility] number [phone] Fax number [phone]

## 2018-05-07 NOTE — Progress Notes (Signed)
I agree with the above plan 

## 2018-05-12 ENCOUNTER — Ambulatory Visit: Payer: Medicare HMO | Admitting: Internal Medicine

## 2018-05-12 DIAGNOSIS — Z0289 Encounter for other administrative examinations: Secondary | ICD-10-CM

## 2018-05-27 ENCOUNTER — Other Ambulatory Visit: Payer: Self-pay | Admitting: Family Medicine

## 2018-05-27 ENCOUNTER — Other Ambulatory Visit: Payer: Self-pay | Admitting: Internal Medicine

## 2018-05-27 ENCOUNTER — Telehealth: Payer: Self-pay

## 2018-05-27 ENCOUNTER — Encounter: Payer: Self-pay | Admitting: Internal Medicine

## 2018-05-27 DIAGNOSIS — I4891 Unspecified atrial fibrillation: Secondary | ICD-10-CM

## 2018-05-27 NOTE — Telephone Encounter (Signed)
Called pt, LVM.   CRM created.  

## 2018-05-27 NOTE — Telephone Encounter (Signed)
-----   Message from [name], MD sent at 05/27/2018 12:54 PM EDT ----- Left message on MyChart, pt to cont same tx except  The test results show that your current treatment is OK, except there was an episode of atrial fibrillation noted on the exam.  We should refer you to cardiology for further consideration of any evaluation or treatment.Redmond Baseman to please inform pt, I will do referral

## 2018-05-30 ENCOUNTER — Telehealth (HOSPITAL_COMMUNITY): Payer: Self-pay | Admitting: *Deleted

## 2018-05-30 NOTE — Telephone Encounter (Signed)
Left message to schedule appt in afib clinic

## 2018-05-30 NOTE — Telephone Encounter (Signed)
-----   Message from Patsey Berthold, NP sent at 05/29/2018  6:42 PM EDT ----- Brian [name] -  Event read out with AF in a stroke patient.  Can you please get him in to see you to discuss [place] soon?  See Allred's read on event monitor.   Thank you!  Museum/gallery conservator

## 2018-06-03 ENCOUNTER — Other Ambulatory Visit: Payer: Self-pay | Admitting: Internal Medicine

## 2018-06-04 ENCOUNTER — Ambulatory Visit (HOSPITAL_COMMUNITY)
Admission: RE | Admit: 2018-06-04 | Discharge: 2018-06-04 | Disposition: A | Discharge status: Home / Self Care | Payer: Medicare HMO | Source: Ambulatory Visit | Attending: Nurse Practitioner | Admitting: Nurse Practitioner

## 2018-06-04 ENCOUNTER — Encounter (HOSPITAL_COMMUNITY): Payer: Self-pay | Admitting: Nurse Practitioner

## 2018-06-04 VITALS — BP 202/88 | HR 64 | Ht 73.0 in | Wt 211.6 lb

## 2018-06-04 DIAGNOSIS — N400 Enlarged prostate without lower urinary tract symptoms: Secondary | ICD-10-CM | POA: Diagnosis not present

## 2018-06-04 DIAGNOSIS — Z8673 Personal history of transient ischemic attack (TIA), and cerebral infarction without residual deficits: Secondary | ICD-10-CM | POA: Insufficient documentation

## 2018-06-04 DIAGNOSIS — I480 Paroxysmal atrial fibrillation: Secondary | ICD-10-CM

## 2018-06-04 DIAGNOSIS — I10 Essential (primary) hypertension: Secondary | ICD-10-CM | POA: Diagnosis not present

## 2018-06-04 DIAGNOSIS — Z823 Family history of stroke: Secondary | ICD-10-CM | POA: Diagnosis not present

## 2018-06-04 DIAGNOSIS — Z7951 Long term (current) use of inhaled steroids: Secondary | ICD-10-CM | POA: Insufficient documentation

## 2018-06-04 DIAGNOSIS — E785 Hyperlipidemia, unspecified: Secondary | ICD-10-CM | POA: Insufficient documentation

## 2018-06-04 DIAGNOSIS — E669 Obesity, unspecified: Secondary | ICD-10-CM | POA: Diagnosis not present

## 2018-06-04 DIAGNOSIS — Z79899 Other long term (current) drug therapy: Secondary | ICD-10-CM | POA: Insufficient documentation

## 2018-06-04 DIAGNOSIS — Z87891 Personal history of nicotine dependence: Secondary | ICD-10-CM | POA: Insufficient documentation

## 2018-06-04 DIAGNOSIS — Z9889 Other specified postprocedural states: Secondary | ICD-10-CM | POA: Diagnosis not present

## 2018-06-04 DIAGNOSIS — E119 Type 2 diabetes mellitus without complications: Secondary | ICD-10-CM | POA: Insufficient documentation

## 2018-06-04 DIAGNOSIS — Z8371 Family history of colonic polyps: Secondary | ICD-10-CM | POA: Diagnosis not present

## 2018-06-04 DIAGNOSIS — Z7902 Long term (current) use of antithrombotics/antiplatelets: Secondary | ICD-10-CM | POA: Insufficient documentation

## 2018-06-04 DIAGNOSIS — Z7984 Long term (current) use of oral hypoglycemic drugs: Secondary | ICD-10-CM | POA: Diagnosis not present

## 2018-06-04 LAB — BASIC METABOLIC PANEL
Anion gap: 11 (ref 5–15)
BUN: 12 mg/dL (ref 8–23)
CALCIUM: 9.7 mg/dL (ref 8.9–10.3)
CHLORIDE: 103 mmol/L (ref 98–111)
CO2: 26 mmol/L (ref 22–32)
CREATININE: 0.94 mg/dL (ref 0.61–1.24)
GFR calc Af Amer: >60 mL/min (ref >60)
GFR calc non Af Amer: >60 mL/min (ref >60)
Glucose, Bld: 146 mg/dL — ABNORMAL HIGH (ref 70–99)
Potassium: 4.3 mmol/L (ref 3.5–5.1)
SODIUM: 140 mmol/L (ref 135–145)

## 2018-06-04 LAB — CBC
HCT: 40.6 % (ref 39.0–52.0)
Hemoglobin: 12.2 g/dL — ABNORMAL LOW (ref 13.0–17.0)
MCH: 26.6 pg (ref 26.0–34.0)
MCHC: 30 g/dL (ref 30.0–36.0)
MCV: 88.6 fL (ref 78.0–100.0)
PLATELETS: 183 10*3/uL (ref 150–400)
RBC: 4.58 MIL/uL (ref 4.22–5.81)
RDW: 13.1 % (ref 11.5–15.5)
WBC: 3.7 10*3/uL — AB (ref 4.0–10.5)

## 2018-06-04 NOTE — Progress Notes (Signed)
Primary Care Physician: [name], MD Referring Physician: Dr. Karma Ganja Beachem is a 82 y.o. male with a h/o DM, HTN, CVA diagnosed 04/01/18. An 30 day  event monitor was placed on the patient, read by Dr. Rayann Heman and did show an episode of afib so he was he was referred to the afib clinic to further discuss.  He is currently on ASA and plavix daily. His BP is elevated today at 200/100. He did take his losartan this am. He did drink a bug cup of coffee this am and has has been avoiding caffeine,  denies any excessive salt intake. He was not aware of any irregular heart rhythm while wearing the monitor.  Today, he denies symptoms of palpitations, chest pain, shortness of breath, orthopnea, PND, lower extremity edema, dizziness, presyncope, syncope, or neurologic sequela. The patient is tolerating medications without difficulties and is otherwise without complaint today.   Past Medical History:  Diagnosis Date  . ALLERGIC RHINITIS 10/13/2007  . ANEMIA, CHRONIC DISEASE NEC 03/31/2007  . BENIGN PROSTATIC HYPERTROPHY 03/31/2007  . Chest pain    Stress echo, normal, December, 2012  . Chronic anemia   . DIABETES MELLITUS, TYPE II 03/31/2007  . [place] DISEASE, LUMBAR 03/31/2007  . ED (erectile dysfunction)   . Ejection fraction    EF  normal, stress echo, December,  2012  . History of prostatitis   . HYPERLIPIDEMIA 03/31/2007  . HYPERTENSION 03/31/2007  . Nephrolithiasis 12/07/2010  . Obesity   . POLYP, ANAL AND RECTAL 03/31/2007  . Right eye trauma    hx as child  . Stroke [facility])    Past Surgical History:  Procedure Laterality Date  . [place] SURGERY     1999  . PROSTATE BIOPSY     s/p  . RECTAL POLYPECTOMY     Transanal excision 10/2005  . SKIN BIOPSY     s/p right upper back 2009- benign Dr. Tonia Brooms    Current Outpatient Medications  Medication Sig Dispense Refill  . ACCU-CHEK SOFTCLIX LANCETS lancets Use as instructed twice per day Dx Code E11.9 200 each 3  . atorvastatin  (LIPITOR) 80 MG tablet Take 1 tablet (80 mg total) by mouth daily at 6 PM. 90 tablet 2  . clopidogrel (PLAVIX) 75 MG tablet Take 1 tablet (75 mg total) by mouth daily. 90 tablet 2  . gabapentin (NEURONTIN) 100 MG capsule Take 1 capsule (100 mg total) by mouth 3 (three) times daily. 270 capsule 1  . glucose blood (ACCU-CHEK AVIVA) test strip Use as instructed twice per day Dx E11.9 200 each 3  . losartan (COZAAR) 100 MG tablet Take 1 tablet (100 mg total) by mouth daily. 90 tablet 3  . metFORMIN (GLUCOPHAGE-XR) 500 MG 24 hr tablet Take 2 tablets (1,000 mg total) by mouth daily with supper. 180 tablet 2  . sitaGLIPtin (JANUVIA) 100 MG tablet Take 1 tablet (100 mg total) by mouth daily. 90 tablet 2  . fluticasone (FLONASE) 50 MCG/ACT nasal spray Place 2 sprays into both nostrils daily as needed. (Patient not taking: Reported on 06/04/2018) 16 g 5   No current facility-administered medications for this encounter.     No Known Allergies  Social History   Socioeconomic History  . Marital status: Married    Spouse name: Not on file  . Number of children: 2  . Years of education: Not on file  . Highest education level: Not on file  Occupational History  . 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 Financial resource strain: Not on file  . Food insecurity:    Worry: Not on file    Inability: Not on file  . Transportation needs:    Medical: Not on file    Non-medical: Not on file  Tobacco Use  . Smoking status: Former Smoker    Years: 10.00    Last attempt to quit: 1995    Years since quitting: 24.7  . Smokeless tobacco: Never Used  Substance and Sexual Activity  . Alcohol use: Yes    Alcohol/week: 1.0 standard drinks    Types: 1 Cans of beer per week    Comment: occasional or yearly  . Drug use: No  . Sexual activity: Not on file  Lifestyle  . Physical activity:    Days per week: Not on file    Minutes per session: Not on file  . Stress: Not on  file  Relationships  . Social connections:    Talks on phone: Not on file    Gets together: Not on file    Attends religious service: Not on file    Active member of club or organization: Not on file    Attends meetings of clubs or organizations: Not on file    Relationship status: Not on file  . Intimate partner violence:    Fear of current or ex partner: Not on file    Emotionally abused: Not on file    Physically abused: Not on file    Forced sexual activity: Not on file  Other Topics Concern  . Not on file  Social History Narrative  . Not on file    Family History  Problem Relation Age of Onset  . Colon polyps Mother   . Stroke Mother        due to ICA stenosis  . CVA Neg Hx   . Seizures Neg Hx     ROS- All systems are reviewed and negative except as per the HPI above  Physical Exam: Vitals:   06/04/18 1525  BP: (!) 202/88  Pulse: 64  Weight: 96 kg  Height: 6\' 1"  (1.854 m)   Wt Readings from Last 3 Encounters:  06/04/18 96 kg  05/06/18 94.6 kg  04/09/18 93.9 kg    Labs: Lab Results  Component Value Date   NA 142 04/01/2018   K 4.0 04/01/2018   CL 104 04/01/2018   CO2 27 04/01/2018   GLUCOSE 273 (H) 04/01/2018   BUN 23 04/01/2018   CREATININE 1.60 (H) 04/01/2018   CALCIUM 9.9 04/01/2018   Lab Results  Component Value Date   INR 1.01 04/01/2018   Lab Results  Component Value Date   CHOL 227 (H) 04/02/2018   HDL 56 04/02/2018   LDLCALC 152 (H) 04/02/2018   TRIG 95 04/02/2018     GEN- The patient is well appearing, alert and oriented x 3 today.   Head- normocephalic, atraumatic Eyes-  Sclera clear, conjunctiva pink Ears- hearing intact Oropharynx- clear Neck- supple, no JVP Lymph- no cervical lymphadenopathy Lungs- Clear to ausculation bilaterally, normal work of breathing Heart- Regular rate and rhythm, no murmurs, rubs or gallops, PMI not laterally displaced GI- soft, NT, ND, + BS Extremities- no clubbing, cyanosis, or edema MS- no  significant deformity or atrophy Skin- no rash or lesion Psych- euthymic mood, full affect Neuro- strength and sensation are intact  EKG-Sinus rhythm at 64 bpm, PR int 224 ms, qrs  int 106 ms, qtc 410 ms Monitor-Study Highlights   Predominant rhythm is sinus rhythm Multiple episodes of supraventricular tachycardia are noted There is also a single episode of atrial fibrillation observed Consider cardiology consultation Clinical correlation advised       Assessment and Plan: 1. New onset of paroxysmal  afib in the setting of recent CVA General edcuation re afib discussed  Pt is asymptomatic with afib but with  this occurrence and recent stroke it would seem he would benefit from change in anticoagulation to Eliquis Based on his last creatinine ,the dose of Eliquis would be 2.5 mg bid , but will repeat bmet today as his previous creatines had been in more of a normal range  CHA2DS2VASc score is at least 6 I will  message Neurology and see if they are in agreement with med change, and to stop asa /plavix General bleeding precautions discussed   2. HTN BP poorly controlled today Rechecked at 200/100 Pt drank coffee today and had quit drinking, avoiding salt Did take losartan 100 mg today I got an appointment with his PCP tomorrow to have BP rechecked  I will arrange for pt to start eliquis after hearing from Neurology  [place]. Sobia Karger, [facility] [address] [place], [place] [address] [phone]

## 2018-06-04 NOTE — Patient Instructions (Signed)
We will call you with correct dose of Eliquis after your lab work results.

## 2018-06-05 ENCOUNTER — Other Ambulatory Visit (HOSPITAL_COMMUNITY): Payer: Self-pay | Admitting: *Deleted

## 2018-06-05 ENCOUNTER — Encounter: Payer: Self-pay | Admitting: Internal Medicine

## 2018-06-05 ENCOUNTER — Ambulatory Visit (INDEPENDENT_AMBULATORY_CARE_PROVIDER_SITE_OTHER): Payer: Medicare HMO | Admitting: Internal Medicine

## 2018-06-05 VITALS — BP 158/88 | HR 69 | Temp 97.6°F | Ht 73.0 in | Wt 212.0 lb

## 2018-06-05 DIAGNOSIS — IMO0002 Reserved for concepts with insufficient information to code with codable children: Secondary | ICD-10-CM

## 2018-06-05 DIAGNOSIS — N521 Erectile dysfunction due to diseases classified elsewhere: Secondary | ICD-10-CM | POA: Diagnosis not present

## 2018-06-05 DIAGNOSIS — I10 Essential (primary) hypertension: Secondary | ICD-10-CM | POA: Diagnosis not present

## 2018-06-05 DIAGNOSIS — E1159 Type 2 diabetes mellitus with other circulatory complications: Secondary | ICD-10-CM

## 2018-06-05 DIAGNOSIS — E1165 Type 2 diabetes mellitus with hyperglycemia: Secondary | ICD-10-CM

## 2018-06-05 DIAGNOSIS — Z23 Encounter for immunization: Secondary | ICD-10-CM | POA: Diagnosis not present

## 2018-06-05 DIAGNOSIS — E785 Hyperlipidemia, unspecified: Secondary | ICD-10-CM

## 2018-06-05 MED ORDER — ATORVASTATIN CALCIUM 80 MG PO TABS: 80.0000 mg | ORAL_TABLET | Freq: Every day | ORAL | 1 refills | Status: DC

## 2018-06-05 MED ORDER — APIXABAN 5 MG PO TABS: 5.0000 mg | ORAL_TABLET | Freq: Two times a day (BID) | ORAL | 0 refills | Status: DC

## 2018-06-05 MED ORDER — LOSARTAN POTASSIUM 100 MG PO TABS: 100.0000 mg | ORAL_TABLET | Freq: Every day | ORAL | 1 refills | Status: DC

## 2018-06-05 MED ORDER — AMLODIPINE BESYLATE 5 MG PO TABS: 5.0000 mg | ORAL_TABLET | Freq: Every day | ORAL | 3 refills | Status: DC

## 2018-06-05 NOTE — Assessment & Plan Note (Signed)
Mod uncontrolled, to add amlodipine 5 qd, cont to monitor BP at home and next visit

## 2018-06-05 NOTE — Progress Notes (Signed)
Subjective:    Patient ID: Brian [name], male    DOB: [birth year], 82 y.o.   MRN: [id number]  HPI   Here with hx of CVA, HLD, HTN as well as recent finding PAF seen per cardiology yesterday with plan to change the plavix to Eliquis pending renal fxn studies, also found to have severe HTN.  Pt states checks BP at home on occasion with values much more like today.  Pt denies chest pain, increased sob or doe, wheezing, orthopnea, PND, increased LE swelling, palpitations, dizziness or syncope. Pt denies new neurological symptoms such as new headache, or facial or extremity weakness or numbness   Pt denies polydipsia, polyuria, or low sugar symptoms such as weakness or confusion improved with po intake.  Pt states overall good compliance with meds, trying to follow lower cholesterol, diabetic diet, wt overall stable BP Readings from Last 3 Encounters:  06/05/18 (!) 158/88  06/04/18 (!) 202/88  05/06/18 (!) 147/60   Past Medical History:  Diagnosis Date  . ALLERGIC RHINITIS 10/13/2007  . ANEMIA, CHRONIC DISEASE NEC 03/31/2007  . BENIGN PROSTATIC HYPERTROPHY 03/31/2007  . Chest pain    Stress echo, normal, December, 2012  . Chronic anemia   . DIABETES MELLITUS, TYPE II 03/31/2007  . [place] DISEASE, LUMBAR 03/31/2007  . ED (erectile dysfunction)   . Ejection fraction    EF  normal, stress echo, December,  2012  . History of prostatitis   . HYPERLIPIDEMIA 03/31/2007  . HYPERTENSION 03/31/2007  . Nephrolithiasis 12/07/2010  . Obesity   . POLYP, ANAL AND RECTAL 03/31/2007  . Right eye trauma    hx as child  . Stroke [facility])    Past Surgical History:  Procedure Laterality Date  . [place] SURGERY     1999  . PROSTATE BIOPSY     s/p  . RECTAL POLYPECTOMY     Transanal excision 10/2005  . SKIN BIOPSY     s/p right upper back 2009- benign Dr. Tonia Brooms    reports that he quit smoking about 24 years ago. He quit after 10.00 years of use. He has never used smokeless tobacco. He reports that he drinks  about 1.0 standard drinks of alcohol per week. He reports that he does not use drugs. family history includes Colon polyps in his mother; Stroke in his mother. No Known Allergies Current Outpatient Medications on File Prior to Visit  Medication Sig Dispense Refill  . ACCU-CHEK SOFTCLIX LANCETS lancets Use as instructed twice per day Dx Code E11.9 200 each 3  . clopidogrel (PLAVIX) 75 MG tablet Take 1 tablet (75 mg total) by mouth daily. 90 tablet 2  . fluticasone (FLONASE) 50 MCG/ACT nasal spray Place 2 sprays into both nostrils daily as needed. 16 g 5  . gabapentin (NEURONTIN) 100 MG capsule Take 1 capsule (100 mg total) by mouth 3 (three) times daily. 270 capsule 1  . glucose blood (ACCU-CHEK AVIVA) test strip Use as instructed twice per day Dx E11.9 200 each 3  . metFORMIN (GLUCOPHAGE-XR) 500 MG 24 hr tablet Take 2 tablets (1,000 mg total) by mouth daily with supper. 180 tablet 2  . sitaGLIPtin (JANUVIA) 100 MG tablet Take 1 tablet (100 mg total) by mouth daily. 90 tablet 2   No current facility-administered medications on file prior to visit.    Review of Systems  Constitutional: Negative for other unusual diaphoresis or sweats HENT: Negative for ear discharge or swelling Eyes: Negative for other worsening visual disturbances Respiratory: Negative  for stridor or other swelling  Gastrointestinal: Negative for worsening distension or other blood Genitourinary: Negative for retention or other urinary change Musculoskeletal: Negative for other MSK pain or swelling Skin: Negative for color change or other new lesions Neurological: Negative for worsening tremors and other numbness  Psychiatric/Behavioral: Negative for worsening agitation or other fatigue All other system neg per pt    Objective:   Physical Exam BP (!) 158/88 Comment: left 158/88 right 164/90  Pulse 69   Temp 97.6 F (36.4 C) (Oral)   Ht 6\' 1"  (1.854 m)   Wt 212 lb (96.2 kg)   SpO2 98%   BMI 27.97 kg/m  VS noted,  not ill appearing Constitutional: Pt appears in NAD HENT: Head: NCAT.  Right Ear: External ear normal.  Left Ear: External ear normal.  Eyes: . Pupils are equal, round, and reactive to light. Conjunctivae and EOM are normal Nose: without d/c or deformity Neck: Neck supple. Gross normal ROM Cardiovascular: Normal rate and regular rhythm.   Pulmonary/Chest: Effort normal and breath sounds without rales or wheezing.  Abd:  Soft, NT, ND, + BS, no organomegaly Neurological: Pt is alert. At baseline orientation Skin: Skin is warm. No rashes, other new lesions, no LE edema Psychiatric: Pt behavior is normal without agitation  No other exam findings  Lab Results  Component Value Date   WBC 3.7 (L) 06/04/2018   HGB 12.2 (L) 06/04/2018   HCT 40.6 06/04/2018   PLT 183 06/04/2018   GLUCOSE 146 (H) 06/04/2018   CHOL 227 (H) 04/02/2018   TRIG 95 04/02/2018   HDL 56 04/02/2018   LDLCALC 152 (H) 04/02/2018   ALT 23 04/01/2018   AST 30 04/01/2018   NA 140 06/04/2018   K 4.3 06/04/2018   CL 103 06/04/2018   CREATININE 0.94 06/04/2018   BUN 12 06/04/2018   CO2 26 06/04/2018   TSH 1.62 07/18/2017   PSA 2.64 07/18/2017   INR 1.01 04/01/2018   HGBA1C 6.4 (H) 04/02/2018   MICROALBUR 1.7 07/18/2017       Assessment & Plan:

## 2018-06-05 NOTE — Patient Instructions (Signed)
OK to start the Amlodipine 5 mg per day  Please continue all other medications as before, and refills have been done if requested.  Please have the pharmacy call with any other refills you may need.  Please continue your efforts at being more active, low cholesterol diabetic diet, and weight control.  Please keep your appointments with your specialists as you may have planned

## 2018-06-05 NOTE — Assessment & Plan Note (Addendum)
Now on statin, for f/u ldl with next labs, cont DM low chol diet

## 2018-06-05 NOTE — Assessment & Plan Note (Signed)
stable overall by history and exam, recent data reviewed with pt, and pt to continue medical treatment as before,  to f/u any worsening symptoms or concerns  

## 2018-06-12 ENCOUNTER — Other Ambulatory Visit (HOSPITAL_COMMUNITY): Payer: Self-pay | Admitting: *Deleted

## 2018-06-12 MED ORDER — APIXABAN 5 MG PO TABS: 5.0000 mg | ORAL_TABLET | Freq: Two times a day (BID) | ORAL | 6 refills | Status: DC

## 2018-06-17 ENCOUNTER — Telehealth (HOSPITAL_COMMUNITY): Payer: Self-pay | Admitting: Nurse Practitioner

## 2018-06-17 NOTE — Telephone Encounter (Addendum)
We have made several attempts to make pt aware that Neurology approved to start eliquis and stop plavix and he will not return calls. I contacted pt's son in [place] to make him aware of the fact that pt needed to start this drug. The pt does not drive but when he was here last the brother said he would come back for the drug. The son will try to get in touch with his uncle to see if he can come by and get drug as soon as possible.

## 2018-06-18 ENCOUNTER — Other Ambulatory Visit (HOSPITAL_COMMUNITY): Payer: Self-pay | Admitting: *Deleted

## 2018-06-18 ENCOUNTER — Other Ambulatory Visit: Payer: Self-pay | Admitting: Family Medicine

## 2018-06-19 ENCOUNTER — Ambulatory Visit (HOSPITAL_COMMUNITY): Payer: Medicare HMO | Admitting: Nurse Practitioner

## 2018-06-20 ENCOUNTER — Telehealth (HOSPITAL_COMMUNITY): Payer: Self-pay | Admitting: *Deleted

## 2018-06-20 NOTE — Telephone Encounter (Signed)
Patient called in stating yesterday and today he has just "felt funny" wondering if could be related to Eliquis. Asked pt to take his bp/hr  -- his BP is 174/96 HR 72. He has not taken his medication this morning. Instructed him to take his medication this morning and call back this afternoon with his bp readings and how he is feeling. Pt verbalized understanding.

## 2018-06-22 ENCOUNTER — Inpatient Hospital Stay (HOSPITAL_COMMUNITY)
Admission: EM | Admit: 2018-06-22 | Discharge: 2018-06-27 | DRG: 065 | Disposition: A | Discharge status: Skilled Nursing Facility | Payer: Medicare HMO | Attending: Family Medicine | Admitting: Family Medicine

## 2018-06-22 ENCOUNTER — Encounter (HOSPITAL_COMMUNITY): Payer: Self-pay | Admitting: Emergency Medicine

## 2018-06-22 ENCOUNTER — Emergency Department (HOSPITAL_COMMUNITY): Payer: Medicare HMO

## 2018-06-22 ENCOUNTER — Other Ambulatory Visit: Payer: Self-pay

## 2018-06-22 DIAGNOSIS — I5189 Other ill-defined heart diseases: Secondary | ICD-10-CM

## 2018-06-22 DIAGNOSIS — I10 Essential (primary) hypertension: Secondary | ICD-10-CM | POA: Diagnosis not present

## 2018-06-22 DIAGNOSIS — Z7984 Long term (current) use of oral hypoglycemic drugs: Secondary | ICD-10-CM

## 2018-06-22 DIAGNOSIS — N521 Erectile dysfunction due to diseases classified elsewhere: Secondary | ICD-10-CM | POA: Diagnosis not present

## 2018-06-22 DIAGNOSIS — I639 Cerebral infarction, unspecified: Secondary | ICD-10-CM | POA: Diagnosis not present

## 2018-06-22 DIAGNOSIS — Z87891 Personal history of nicotine dependence: Secondary | ICD-10-CM

## 2018-06-22 DIAGNOSIS — E1151 Type 2 diabetes mellitus with diabetic peripheral angiopathy without gangrene: Secondary | ICD-10-CM | POA: Diagnosis present

## 2018-06-22 DIAGNOSIS — R29818 Other symptoms and signs involving the nervous system: Secondary | ICD-10-CM | POA: Diagnosis not present

## 2018-06-22 DIAGNOSIS — E1165 Type 2 diabetes mellitus with hyperglycemia: Secondary | ICD-10-CM | POA: Diagnosis present

## 2018-06-22 DIAGNOSIS — E1159 Type 2 diabetes mellitus with other circulatory complications: Secondary | ICD-10-CM | POA: Diagnosis present

## 2018-06-22 DIAGNOSIS — Z8673 Personal history of transient ischemic attack (TIA), and cerebral infarction without residual deficits: Secondary | ICD-10-CM

## 2018-06-22 DIAGNOSIS — N529 Male erectile dysfunction, unspecified: Secondary | ICD-10-CM | POA: Diagnosis present

## 2018-06-22 DIAGNOSIS — N400 Enlarged prostate without lower urinary tract symptoms: Secondary | ICD-10-CM | POA: Diagnosis present

## 2018-06-22 DIAGNOSIS — I169 Hypertensive crisis, unspecified: Secondary | ICD-10-CM | POA: Diagnosis present

## 2018-06-22 DIAGNOSIS — E782 Mixed hyperlipidemia: Secondary | ICD-10-CM | POA: Diagnosis present

## 2018-06-22 DIAGNOSIS — Z823 Family history of stroke: Secondary | ICD-10-CM

## 2018-06-22 DIAGNOSIS — Z7901 Long term (current) use of anticoagulants: Secondary | ICD-10-CM

## 2018-06-22 DIAGNOSIS — I63522 Cerebral infarction due to unspecified occlusion or stenosis of left anterior cerebral artery: Secondary | ICD-10-CM | POA: Diagnosis not present

## 2018-06-22 DIAGNOSIS — R29810 Facial weakness: Secondary | ICD-10-CM | POA: Diagnosis present

## 2018-06-22 DIAGNOSIS — E1142 Type 2 diabetes mellitus with diabetic polyneuropathy: Secondary | ICD-10-CM | POA: Diagnosis present

## 2018-06-22 DIAGNOSIS — R4781 Slurred speech: Secondary | ICD-10-CM | POA: Diagnosis present

## 2018-06-22 DIAGNOSIS — G8191 Hemiplegia, unspecified affecting right dominant side: Secondary | ICD-10-CM | POA: Diagnosis present

## 2018-06-22 DIAGNOSIS — E785 Hyperlipidemia, unspecified: Secondary | ICD-10-CM | POA: Diagnosis not present

## 2018-06-22 DIAGNOSIS — E663 Overweight: Secondary | ICD-10-CM | POA: Diagnosis present

## 2018-06-22 DIAGNOSIS — R2990 Unspecified symptoms and signs involving the nervous system: Secondary | ICD-10-CM

## 2018-06-22 DIAGNOSIS — Z6827 Body mass index (BMI) 27.0-27.9, adult: Secondary | ICD-10-CM

## 2018-06-22 DIAGNOSIS — I480 Paroxysmal atrial fibrillation: Secondary | ICD-10-CM | POA: Diagnosis present

## 2018-06-22 DIAGNOSIS — I63512 Cerebral infarction due to unspecified occlusion or stenosis of left middle cerebral artery: Secondary | ICD-10-CM | POA: Diagnosis not present

## 2018-06-22 DIAGNOSIS — R29704 NIHSS score 4: Secondary | ICD-10-CM | POA: Diagnosis present

## 2018-06-22 LAB — COMPREHENSIVE METABOLIC PANEL
ALK PHOS: 60 U/L (ref 38–126)
ALT: 21 U/L (ref 0–44)
ANION GAP: 10 (ref 5–15)
AST: 32 U/L (ref 15–41)
Albumin: 4.4 g/dL (ref 3.5–5.0)
BILIRUBIN TOTAL: 1 mg/dL (ref 0.3–1.2)
BUN: 18 mg/dL (ref 8–23)
CALCIUM: 10 mg/dL (ref 8.9–10.3)
CO2: 25 mmol/L (ref 22–32)
Chloride: 103 mmol/L (ref 98–111)
Creatinine, Ser: 0.99 mg/dL (ref 0.61–1.24)
GFR calc non Af Amer: >60 mL/min (ref >60)
Glucose, Bld: 170 mg/dL — ABNORMAL HIGH (ref 70–99)
Potassium: 4.2 mmol/L (ref 3.5–5.1)
Sodium: 138 mmol/L (ref 135–145)
TOTAL PROTEIN: 7.3 g/dL (ref 6.5–8.1)

## 2018-06-22 LAB — PROTIME-INR
INR: 1.19
Prothrombin Time: 15.1 seconds (ref 11.4–15.2)

## 2018-06-22 LAB — CBC
HCT: 45.1 % (ref 39.0–52.0)
Hemoglobin: 13 g/dL (ref 13.0–17.0)
MCH: 26.3 pg (ref 26.0–34.0)
MCHC: 28.8 g/dL — AB (ref 30.0–36.0)
MCV: 91.1 fL (ref 78.0–100.0)
Platelets: 187 10*3/uL (ref 150–400)
RBC: 4.95 MIL/uL (ref 4.22–5.81)
RDW: 13.2 % (ref 11.5–15.5)
WBC: 4.7 10*3/uL (ref 4.0–10.5)

## 2018-06-22 LAB — DIFFERENTIAL
Abs Immature Granulocytes: 0 10*3/uL (ref 0.0–0.1)
Basophils Absolute: 0 10*3/uL (ref 0.0–0.1)
Basophils Relative: 1 %
EOS PCT: 2 %
Eosinophils Absolute: 0.1 10*3/uL (ref 0.0–0.7)
Immature Granulocytes: 1 %
LYMPHS ABS: 1.2 10*3/uL (ref 0.7–4.0)
LYMPHS PCT: 26 %
MONO ABS: 0.5 10*3/uL (ref 0.1–1.0)
MONOS PCT: 10 %
Neutro Abs: 2.8 10*3/uL (ref 1.7–7.7)
Neutrophils Relative %: 60 %

## 2018-06-22 LAB — I-STAT TROPONIN, ED: Troponin i, poc: 0 ng/mL (ref 0.00–0.08)

## 2018-06-22 LAB — CBG MONITORING, ED: GLUCOSE-CAPILLARY: 155 mg/dL — AB (ref 70–99)

## 2018-06-22 LAB — APTT: aPTT: 37 seconds — ABNORMAL HIGH (ref 24–36)

## 2018-06-22 LAB — GLUCOSE, CAPILLARY: GLUCOSE-CAPILLARY: 109 mg/dL — AB (ref 70–99)

## 2018-06-22 MED ORDER — ASPIRIN 325 MG PO TABS
325.0000 mg | ORAL_TABLET | Freq: Every day | ORAL | Status: DC
  Administered 2018-06-22 – 2018-06-23 (×2): 325 mg via ORAL
  Filled 2018-06-22 (×2): qty 1

## 2018-06-22 MED ORDER — SENNOSIDES-DOCUSATE SODIUM 8.6-50 MG PO TABS
1.0000 | ORAL_TABLET | Freq: Every evening | ORAL | Status: DC | PRN
  Filled 2018-06-22: qty 1

## 2018-06-22 MED ORDER — STROKE: EARLY STAGES OF RECOVERY BOOK
Freq: Once | Status: AC
  Administered 2018-06-22: 22:00:00
  Filled 2018-06-22: qty 1

## 2018-06-22 MED ORDER — ASPIRIN 300 MG RE SUPP: 300.0000 mg | Freq: Every day | RECTAL | Status: DC

## 2018-06-22 MED ORDER — INSULIN ASPART 100 UNIT/ML ~~LOC~~ SOLN
0.0000 [IU] | Freq: Every day | SUBCUTANEOUS | Status: DC
  Administered 2018-06-26: 2 [IU] via SUBCUTANEOUS

## 2018-06-22 MED ORDER — ACETAMINOPHEN 650 MG RE SUPP: 650.0000 mg | RECTAL | Status: DC | PRN

## 2018-06-22 MED ORDER — ACETAMINOPHEN 325 MG PO TABS: 650.0000 mg | ORAL_TABLET | ORAL | Status: DC | PRN

## 2018-06-22 MED ORDER — GABAPENTIN 100 MG PO CAPS
100.0000 mg | ORAL_CAPSULE | Freq: Three times a day (TID) | ORAL | Status: DC
  Administered 2018-06-22 – 2018-06-27 (×14): 100 mg via ORAL
  Filled 2018-06-22 (×14): qty 1

## 2018-06-22 MED ORDER — SODIUM CHLORIDE 0.9 % IV SOLN
INTRAVENOUS | Status: DC
  Administered 2018-06-22: 22:00:00 via INTRAVENOUS

## 2018-06-22 MED ORDER — ATORVASTATIN CALCIUM 80 MG PO TABS
80.0000 mg | ORAL_TABLET | Freq: Every day | ORAL | Status: DC
  Administered 2018-06-23 – 2018-06-26 (×4): 80 mg via ORAL
  Filled 2018-06-22 (×4): qty 1

## 2018-06-22 MED ORDER — ACETAMINOPHEN 160 MG/5ML PO SOLN: 650.0000 mg | ORAL | Status: DC | PRN

## 2018-06-22 MED ORDER — INSULIN ASPART 100 UNIT/ML ~~LOC~~ SOLN
0.0000 [IU] | Freq: Three times a day (TID) | SUBCUTANEOUS | Status: DC
  Administered 2018-06-23: 3 [IU] via SUBCUTANEOUS
  Administered 2018-06-23: 1 [IU] via SUBCUTANEOUS
  Administered 2018-06-24 (×2): 3 [IU] via SUBCUTANEOUS
  Administered 2018-06-24 – 2018-06-25 (×2): 2 [IU] via SUBCUTANEOUS
  Administered 2018-06-25: 1 [IU] via SUBCUTANEOUS
  Administered 2018-06-25: 3 [IU] via SUBCUTANEOUS
  Administered 2018-06-26 (×2): 2 [IU] via SUBCUTANEOUS
  Administered 2018-06-26: 1 [IU] via SUBCUTANEOUS
  Filled 2018-06-22 (×2): qty 1

## 2018-06-22 NOTE — ED Triage Notes (Signed)
C/o stroke symptoms since 1pm yesterday.  Reports he noticed difficulty walking with R sided weakness while he was gone to get eye glasses worked on yesterday.  Pt with R sided facial droop and R arm drift. Also reports slurred speech that he states is a little better than it was yesterday.  Pt lives by himself.

## 2018-06-22 NOTE — ED Provider Notes (Signed)
[facility] EMERGENCY DEPARTMENT Provider Note   CSN: [id number] Arrival date & time: 06/22/18  1540     History   Chief Complaint Chief Complaint  Patient presents with  . Stroke Symptoms    HPI Brian [name] is a 82 y.o. male.  82 year old male with prior medical history as detailed below presents with complaint of right-sided facial droop, slurred speech, and right-sided weakness.  Patient reports that his symptoms started yesterday afternoon.  Symptoms failed to improve overnight and he decided to come to the ED today for evaluation.  He reports prior stroke in July 2019.  This was worked up and treated at [facility].  Patient reports that he has been taking Eliquis for the last 4 days.  Patient denies associated chest pain, shortness of breath, nausea, vomiting, or other acute complaint.   The history is provided by the patient and medical records.  Cerebrovascular Accident  This is a new problem. The current episode started 12 to 24 hours ago. The problem occurs rarely. The problem has not changed since onset.Pertinent negatives include no chest pain, no abdominal pain, no headaches and no shortness of breath. Nothing aggravates the symptoms. Nothing relieves the symptoms. He has tried nothing for the symptoms.    Past Medical History:  Diagnosis Date  . ALLERGIC RHINITIS 10/13/2007  . ANEMIA, CHRONIC DISEASE NEC 03/31/2007  . BENIGN PROSTATIC HYPERTROPHY 03/31/2007  . Chest pain    Stress echo, normal, December, 2012  . Chronic anemia   . DIABETES MELLITUS, TYPE II 03/31/2007  . [place] DISEASE, LUMBAR 03/31/2007  . ED (erectile dysfunction)   . Ejection fraction    EF  normal, stress echo, December,  2012  . History of prostatitis   . HYPERLIPIDEMIA 03/31/2007  . HYPERTENSION 03/31/2007  . Nephrolithiasis 12/07/2010  . Obesity   . POLYP, ANAL AND RECTAL 03/31/2007  . Right eye trauma    hx as child  . Stroke [facility])     Patient Active Problem List   Diagnosis Date Noted  . CVA (cerebral vascular accident) ([place]) 06/22/2018  . Acute CVA (cerebrovascular accident) ([place]) 04/09/2018  . Cerebral embolism with cerebral infarction 04/02/2018  . Acute renal failure (ARF) ([place]) 04/01/2018  . Right leg pain 12/04/2017  . Grief 12/04/2017  . Uncontrolled type 2 diabetes mellitus with circulatory disorder causing erectile dysfunction ([place]) 10/02/2016  . Allergic conjunctivitis 01/12/2016  . Abdominal pain, other specified site 06/23/2012  . Erectile dysfunction 12/13/2011  . Ejection fraction   . Chest pain   . Fatigue 06/14/2011  . Encounter for well adult exam with abnormal findings 06/10/2011  . Nephrolithiasis 12/07/2010  . Hypertension 12/07/2010  . SKIN LESION 03/01/2008  . ALLERGIC RHINITIS 10/13/2007  . Hyperlipidemia 03/31/2007  . Overweight (BMI 25.0-29.9) 03/31/2007  . ANEMIA, CHRONIC DISEASE NEC 03/31/2007  . POLYP, ANAL AND RECTAL 03/31/2007  . BENIGN PROSTATIC HYPERTROPHY 03/31/2007  . [place] DISEASE, LUMBAR 03/31/2007    Past Surgical History:  Procedure Laterality Date  . [place] SURGERY     1999  . PROSTATE BIOPSY     s/p  . RECTAL POLYPECTOMY     Transanal excision 10/2005  . SKIN BIOPSY     s/p right upper back 2009- benign Dr. Tonia Brooms        Home Medications    Prior to Admission medications   Medication Sig Start Date End Date Taking? Authorizing Provider  ACCU-CHEK SOFTCLIX LANCETS lancets Use as instructed twice per day Dx  Code E11.9 12/19/17   Philemon Kingdom, MD  amLODipine (NORVASC) 5 MG tablet Take 1 tablet (5 mg total) by mouth daily. 06/05/18 06/05/19  [name], MD  apixaban (ELIQUIS) 5 MG TABS tablet Take 1 tablet (5 mg total) by mouth 2 (two) times daily. 06/12/18   Sherran Needs, NP  atorvastatin (LIPITOR) 80 MG tablet Take 1 tablet (80 mg total) by mouth daily at 6 PM. 06/05/18   [name], MD  fluticasone [facility]) 50 MCG/ACT nasal spray Place 2 sprays into both nostrils daily as  needed. 04/08/18   [name], MD  gabapentin (NEURONTIN) 100 MG capsule Take 1 capsule (100 mg total) by mouth 3 (three) times daily. 04/08/18   [name], MD  gabapentin (NEURONTIN) 100 MG capsule TAKE 1 CAPSULE BY MOUTH THREE TIMES A DAY 06/18/18   Rosemarie Ax, MD  glucose blood (ACCU-CHEK AVIVA) test strip Use as instructed twice per day Dx E11.9 12/19/17   Philemon Kingdom, MD  losartan (COZAAR) 100 MG tablet Take 1 tablet (100 mg total) by mouth daily. 06/05/18   [name], MD  metFORMIN (GLUCOPHAGE-XR) 500 MG 24 hr tablet Take 2 tablets (1,000 mg total) by mouth daily with supper. 04/08/18   [name], MD  sitaGLIPtin (JANUVIA) 100 MG tablet Take 1 tablet (100 mg total) by mouth daily. 04/08/18   [name], MD    Family History Family History  Problem Relation Age of Onset  . Colon polyps Mother   . Stroke Mother        due to ICA stenosis  . CVA Neg Hx   . Seizures Neg Hx     Social History Social History   Tobacco Use  . Smoking status: Former Smoker    Years: 10.00    Last attempt to quit: 1995    Years since quitting: 24.7  . Smokeless tobacco: Never Used  Substance Use Topics  . Alcohol use: Yes    Alcohol/week: 1.0 standard drinks    Types: 1 Cans of beer per week    Comment: occasional or yearly  . Drug use: No     Allergies   Patient has no known allergies.   Review of Systems Review of Systems  Respiratory: Negative for shortness of breath.   Cardiovascular: Negative for chest pain.  Gastrointestinal: Negative for abdominal pain.  Neurological: Negative for headaches.  All other systems reviewed and are negative.    Physical Exam Updated Vital Signs BP (!) 146/98   Pulse 70   Temp 98.3 F (36.8 C) (Oral)   Resp 14   Ht 6\' 1"  (1.854 m)   Wt 96 kg   SpO2 97%   BMI 27.92 kg/m   Physical Exam  Constitutional: He is oriented to person, place, and time. He appears well-developed and well-nourished. No distress.  HENT:    Head: Normocephalic and atraumatic.  Mouth/Throat: Oropharynx is clear and moist.  Eyes: Pupils are equal, round, and reactive to light. Conjunctivae and EOM are normal.  Neck: Normal range of motion. Neck supple.  Cardiovascular: Normal rate, regular rhythm and normal heart sounds.  Pulmonary/Chest: Effort normal and breath sounds normal. No respiratory distress.  Abdominal: Soft. He exhibits no distension. There is no tenderness.  Musculoskeletal: Normal range of motion. He exhibits no edema or deformity.  Neurological: He is alert and oriented to person, place, and time.  Alert and oriented x4 Mildly dysarthric speech Right-sided facial droop 5 out  of 5 strength of both upper and lower extremities VAN negative   Skin: Skin is warm and dry.  Psychiatric: He has a normal mood and affect.  Nursing note and vitals reviewed.    ED Treatments / Results  Labs (all labs ordered are listed, but only abnormal results are displayed) Labs Reviewed  APTT - Abnormal; Notable for the following components:      Result Value   aPTT 37 (*)    All other components within normal limits  CBC - Abnormal; Notable for the following components:   MCHC 28.8 (*)    All other components within normal limits  COMPREHENSIVE METABOLIC PANEL - Abnormal; Notable for the following components:   Glucose, Bld 170 (*)    All other components within normal limits  CBG MONITORING, ED - Abnormal; Notable for the following components:   Glucose-Capillary 155 (*)    All other components within normal limits  PROTIME-INR  DIFFERENTIAL  I-STAT TROPONIN, ED  CBG MONITORING, ED    EKG EKG Interpretation  Date/Time:  Sunday June 22 2018 15:47:21 EDT Ventricular Rate:  82 PR Interval:  222 QRS Duration: 102 QT Interval:  362 QTC Calculation: 422 R Axis:   -60 Text Interpretation:  Sinus rhythm with 1st degree A-V block Incomplete right bundle branch block Left anterior fascicular block Left ventricular  hypertrophy Abnormal ECG Confirmed by Dene Gentry [phone]) on 06/22/2018 5:03:33 PM   Radiology Ct Head Wo Contrast  Result Date: 06/22/2018 CLINICAL DATA:  Right-sided weakness with right facial droop. EXAM: CT HEAD WITHOUT CONTRAST TECHNIQUE: Contiguous axial images were obtained from the base of the skull through the vertex without intravenous contrast. COMPARISON:  04/01/2018. FINDINGS: Brain: There is no evidence for acute hemorrhage, hydrocephalus, mass lesion, or abnormal extra-axial fluid collection. No definite CT evidence for acute infarction. Old right cerebellar infarct noted. Patchy low attenuation in the deep hemispheric and periventricular white matter is nonspecific, but likely reflects chronic microvascular ischemic demyelination. Vascular: No hyperdense vessel or unexpected calcification. Skull: No evidence for fracture. No worrisome lytic or sclerotic lesion. Sinuses/Orbits: The visualized paranasal sinuses and mastoid air cells are clear. Visualized portions of the globes and intraorbital fat are unremarkable. Other: None. IMPRESSION: 1. No acute intracranial abnormality. 2. Old right cerebellar infarct. 3. Chronic microvascular white matter disease Electronically Signed   By: Misty Stanley M.D.   On: 06/22/2018 16:25    Procedures Procedures (including critical care time)  Medications Ordered in ED Medications - No data to display   Initial Impression / Assessment and Plan / ED Course  I have reviewed the triage vital signs and the nursing notes.  Pertinent labs & imaging results that were available during my care of the patient were reviewed by me and considered in my medical decision making (see chart for details).     MDM  Screen complete  Patient is presenting with complaint of right-sided weakness and right-sided facial droop.  Onset of symptoms was approximately 24 hours prior.  Patient is not a candidate for TPA given his timeframe of symptoms.  CT imaging does  not reveal acute CVA.  Patient will require further work-up including MRI to evaluate for likely CVA.  Dr. Leonel Ramsay of the neuro team is aware of case and will evaluate.  Hospitalist service is aware of case and will evaluate for admission.  Final Clinical Impressions(s) / ED Diagnoses   Final diagnoses:  Stroke-like symptoms    ED Discharge Orders    None  Valarie Merino, MD 06/22/18 [phone]

## 2018-06-22 NOTE — H&P (Signed)
History and Physical    JUANJOSE MOJICA NLZ:[id number] DOB: 1936/08/05 DOA: 06/22/2018  PCP: [name], MD   I have briefly reviewed patients previous medical reports in [facility].  Patient coming from: Home  Chief Complaint: Right-sided weakness, slurred speech, facial droop.  HPI: ELLIJAH LEFFEL is a pleasant 82 year old male, recently widowed 11/14/2017, lives alone, ambulates with the help of a cane, right handed, PMH of DM 2, HTN, HLD, CVA 04/01/2018 with no residual weakness, recently diagnosed PAF by 30-day event monitor placed on outpatient and started on Eliquis which he reportedly started 06/19/2018, presented to [facility] ED on 06/22/2018 due to right-sided weakness, slurred speech and facial droop that began the day prior to admission.  Patient reports that he was in his usual state of health until 10/5 when he was coming out of eyeglass store and at around 12 noon-1 PM noted that he was walking strangely due to weakness in his right lower extremity, had to lift up his leg and put it down, was unsteady and had to hold on to something to prevent fall.  When he went home, he noted that he was also weak in the right upper extremity, was unable to write like he usually did or flip pages from a book.  He denied any tingling or numbness.  No headache or loss of consciousness.  He however did not seek any medical attention at that time.  On day of admission, when he looked in the mirror he noted that he had facial droop towards the right side, drooling of saliva on the right side and slurred speech at which time he decided to come to the ED.  He states that there has been no improvement or worsening of his symptoms since yesterday.  ED Course: CT head shows no acute intracranial abnormality, old right cerebellar infarct and chronic microvascular white matter disease.  Lab work only significant for glucose of 170.  Neurology was consulted by EDP.  Review of Systems:  All other systems reviewed and  apart from HPI, are negative. Denies falls.  Past Medical History:  Diagnosis Date  . ALLERGIC RHINITIS 10/13/2007  . ANEMIA, CHRONIC DISEASE NEC 03/31/2007  . BENIGN PROSTATIC HYPERTROPHY 03/31/2007  . Chest pain    Stress echo, normal, December, 2012  . Chronic anemia   . DIABETES MELLITUS, TYPE II 03/31/2007  . [place] DISEASE, LUMBAR 03/31/2007  . ED (erectile dysfunction)   . Ejection fraction    EF  normal, stress echo, December,  2012  . History of prostatitis   . HYPERLIPIDEMIA 03/31/2007  . HYPERTENSION 03/31/2007  . Nephrolithiasis 12/07/2010  . Obesity   . POLYP, ANAL AND RECTAL 03/31/2007  . Right eye trauma    hx as child  . Stroke [facility])     Past Surgical History:  Procedure Laterality Date  . [place] SURGERY     1999  . PROSTATE BIOPSY     s/p  . RECTAL POLYPECTOMY     Transanal excision 10/2005  . SKIN BIOPSY     s/p right upper back 2009- benign Dr. Tonia Brooms    Social History  reports that he quit smoking about 24 years ago. He quit after 10.00 years of use. He has never used smokeless tobacco. He reports that he drinks about 1.0 standard drinks of alcohol per week. He reports that he does not use drugs.  No Known Allergies  Family History  Problem Relation Age of Onset  . Colon polyps  Mother   . Stroke Mother        due to ICA stenosis  . CVA Neg Hx   . Seizures Neg Hx      Prior to Admission medications   Medication Sig Start Date End Date Taking? Authorizing Provider  amLODipine (NORVASC) 5 MG tablet Take 1 tablet (5 mg total) by mouth daily. 06/05/18 06/05/19 Yes [name], MD  apixaban (ELIQUIS) 5 MG TABS tablet Take 1 tablet (5 mg total) by mouth 2 (two) times daily. Patient taking differently: Take 5 mg by mouth 2 (two) times daily with a meal.  06/12/18  Yes Sherran Needs, NP  atorvastatin (LIPITOR) 80 MG tablet Take 1 tablet (80 mg total) by mouth daily at 6 PM. 06/05/18  Yes [name], MD  fluticasone [facility]) 50 MCG/ACT nasal spray  Place 2 sprays into both nostrils daily as needed. Patient taking differently: Place 2 sprays into both nostrils daily as needed for allergies or rhinitis.  04/08/18  Yes [name], MD  gabapentin (NEURONTIN) 100 MG capsule TAKE 1 CAPSULE BY MOUTH THREE TIMES A DAY Patient taking differently: Take 100 mg by mouth 3 (three) times daily.  06/18/18  Yes Rosemarie Ax, MD  losartan (COZAAR) 100 MG tablet Take 1 tablet (100 mg total) by mouth daily. 06/05/18  Yes [name], MD  metFORMIN (GLUCOPHAGE-XR) 500 MG 24 hr tablet Take 2 tablets (1,000 mg total) by mouth daily with supper. 04/08/18  Yes [name], MD  OVER THE COUNTER MEDICATION Place 1 drop into both eyes daily as needed (dry eyes). Over the counter lubricating eye drop   Yes [provider]  sitaGLIPtin (JANUVIA) 100 MG tablet Take 1 tablet (100 mg total) by mouth daily. 04/08/18  Yes [name], MD  ACCU-CHEK [facility] LANCETS lancets Use as instructed twice per day Dx Code E11.9 12/19/17   Philemon Kingdom, MD  glucose blood (ACCU-CHEK AVIVA) test strip Use as instructed twice per day Dx E11.9 12/19/17   Philemon Kingdom, MD    Physical Exam: Vitals:   06/22/18 1718 06/22/18 1745 06/22/18 1833 06/22/18 1849  BP: (!) 146/98 (!) 148/74  (!) 165/86  Pulse: 70 62  63  Resp: 14 18  18   Temp:   97.6 F (36.4 C) 98.6 F (37 C)  TempSrc:    Oral  SpO2: 97% 96%  99%  Weight:      Height:          Constitutional: Pleasant elderly male, moderately built and nourished, sitting up comfortably in bed. Eyes: PERTLA, lids and conjunctivae normal.  Right intraocular lens.  Left immature cataract.  Bilateral arcus senalis. ENMT: Mucous membranes are moist. Posterior pharynx clear of any exudate or lesions. Normal dentition.  Neck: supple, no masses, no thyromegaly Respiratory: clear to auscultation bilaterally, no wheezing, no crackles. Normal respiratory effort. No accessory muscle use.  Cardiovascular: S1 and S2 heard,  RRR.  No JVD, murmurs or pedal edema.  No carotid bruit.  Telemetry personally reviewed and shows normal sinus rhythm. Abdomen: Nondistended, soft and nontender.  No organomegaly or masses appreciated.  Normal bowel sounds heard. Musculoskeletal/extremities: no clubbing / cyanosis. No joint deformity upper and lower extremities. Normal muscle tone.  Right limbs grade 4+ by 5 power.  Left limbs grade 5 x 5 power. Skin: no rashes, lesions, ulcers. No induration Neurologic: Alert and oriented x3.  Dysarthria +.  Diminished right nasolabial fold with associated facial droop.?  Right  pronator sign. Psychiatric: Normal judgment and insight. Normal mood.     Labs on Admission: I have personally reviewed following labs and imaging studies  CBC: Recent Labs  Lab 06/22/18 1606  WBC 4.7  NEUTROABS 2.8  HGB 13.0  HCT 45.1  MCV 91.1  PLT 130   Basic Metabolic Panel: Recent Labs  Lab 06/22/18 1606  NA 138  K 4.2  CL 103  CO2 25  GLUCOSE 170*  BUN 18  CREATININE 0.99  CALCIUM 10.0   Liver Function Tests: Recent Labs  Lab 06/22/18 1606  AST 32  ALT 21  ALKPHOS 60  BILITOT 1.0  PROT 7.3  ALBUMIN 4.4   Coagulation Profile: Recent Labs  Lab 06/22/18 1606  INR 1.19   CBG: Recent Labs  Lab 06/22/18 1545  GLUCAP 155*      Radiological Exams on Admission: Ct Head Wo Contrast  Result Date: 06/22/2018 CLINICAL DATA:  Right-sided weakness with right facial droop. EXAM: CT HEAD WITHOUT CONTRAST TECHNIQUE: Contiguous axial images were obtained from the base of the skull through the vertex without intravenous contrast. COMPARISON:  04/01/2018. FINDINGS: Brain: There is no evidence for acute hemorrhage, hydrocephalus, mass lesion, or abnormal extra-axial fluid collection. No definite CT evidence for acute infarction. Old right cerebellar infarct noted. Patchy low attenuation in the deep hemispheric and periventricular white matter is nonspecific, but likely reflects chronic  microvascular ischemic demyelination. Vascular: No hyperdense vessel or unexpected calcification. Skull: No evidence for fracture. No worrisome lytic or sclerotic lesion. Sinuses/Orbits: The visualized paranasal sinuses and mastoid air cells are clear. Visualized portions of the globes and intraorbital fat are unremarkable. Other: None. IMPRESSION: 1. No acute intracranial abnormality. 2. Old right cerebellar infarct. 3. Chronic microvascular white matter disease Electronically Signed   By: Misty Stanley M.D.   On: 06/22/2018 16:25    EKG: Independently reviewed.  Sinus rhythm at 82 bpm, incomplete RBBB/LAD, LAFB, no acute changes and QTc 422 ms.  Assessment/Plan Principal Problem:   Acute CVA (cerebrovascular accident) ([place]) Active Problems:   Hyperlipidemia   Overweight (BMI 25.0-29.9)   Hypertension   Uncontrolled type 2 diabetes mellitus with circulatory disorder causing erectile dysfunction ([place])     1. Suspected acute left brain stroke: With residual right hemiparesis, dysarthria and facial droop.  Etiology: Likely embolic due to PAF.  CT head without acute abnormality.  Check MRI brain to clearly define stroke.  Deferred carotid Dopplers, 2D echo because he recently completed stroke work-up in July 2019 & will defer to Neurology if needed.  On 7/17, LDL 152 and A1c 6.4.  Follow-up repeat A1c and lipid panel.  As per A. fib office visit 9/18, a 30-day event monitor placed on the patient had shown an episode of A. fib, patient however asymptomatic of A. fib, aspirin and Plavix were discontinued and he recently started Eliquis and took last dose this morning.  PT, OT and ST evaluation.  Neurology was consulted and I discussed with Neuro Hospitalist who recommended holding Eliquis until size of stroke is determined by MRI brain but start aspirin for now. 2. Essential hypertension: Allow permissive hypertension due to acute stroke.  Hold amlodipine and losartan. 3. Type II DM with peripheral  neuropathy: Hold oral hypoglycemics.  SSI.  A1c noted as above. 4. Hyperlipidemia: LDL as noted above.  Continue atorvastatin 80 mg daily. 5. PAF: Currently in sinus rhythm.  Not on rate control medications PTA.  Eliquis on hold until size of stroke clearly defined.   DVT  prophylaxis: SCDs Code Status: Full Family Communication: None at bedside Disposition Plan: DC home pending clinical improvement and therapies evaluation. Consults called: Neurology Admission status: Telemetry, Observation  Severity of Illness: The appropriate patient status for this patient is OBSERVATION. Observation status is judged to be reasonable and necessary in order to provide the required intensity of service to ensure the patient's safety. The patient's presenting symptoms, physical exam findings, and initial radiographic and laboratory data in the context of their medical condition is felt to place them at decreased risk for further clinical deterioration. Furthermore, it is anticipated that the patient will be medically stable for discharge from the hospital within 2 midnights of admission. The following factors support the patient status of observation.   " The patient's presenting symptoms include right-sided weakness, slurred speech and facial droop. " The physical exam findings include hemiparesis, dysarthria and facial droop. " The initial radiographic and laboratory data are the head without acute findings, random blood sugar at 170.       Vernell Leep MD [facility] Pager [phone]  If 7PM-7AM, please contact night-coverage [email or web address] Password [facility]  06/22/2018, 6:58 PM

## 2018-06-22 NOTE — Consult Note (Signed)
Requesting Physician: Dr. Algis Liming    Chief Complaint: right facial droop, weakness and numbness  History obtained from: Patient and Chart     HPI:                                                                                                                                       Brian [name] is an 82 y.o. male past medical history of hypertension, hyperlipidemia, diabetes mellitus, recent right parietal stroke in July 2019 with new diagnosis of paroxysmal atrial fibrillation detected on 30 Day Loop monitor switched to Eliquis from aspirin Plavix presents to the emergency room with 1 day history of slurred speech, right facial droop and right-sided numbness and weakness.  Patient states that his symptoms started Saturday afternoon while having lunch.  He noticed that his speech was slurred and he had right-sided numbness and weakness.  He thought it was a side effect of the new medication, however since his symptoms persisted he decided to come to the emergency room.    Date last known well: 10.5.19 Time last known well: 12 PM tPA Given: no, outside window, mild symptoms and on anticoagulation NIHSS: 4 Baseline MRS 0    Past Medical History:  Diagnosis Date  . ALLERGIC RHINITIS 10/13/2007  . ANEMIA, CHRONIC DISEASE NEC 03/31/2007  . BENIGN PROSTATIC HYPERTROPHY 03/31/2007  . Chest pain    Stress echo, normal, December, 2012  . Chronic anemia   . DIABETES MELLITUS, TYPE II 03/31/2007  . [place] DISEASE, LUMBAR 03/31/2007  . ED (erectile dysfunction)   . Ejection fraction    EF  normal, stress echo, December,  2012  . History of prostatitis   . HYPERLIPIDEMIA 03/31/2007  . HYPERTENSION 03/31/2007  . Nephrolithiasis 12/07/2010  . Obesity   . POLYP, ANAL AND RECTAL 03/31/2007  . Right eye trauma    hx as child  . Stroke [facility])     Past Surgical History:  Procedure Laterality Date  . [place] SURGERY     1999  . PROSTATE BIOPSY     s/p  . RECTAL POLYPECTOMY     Transanal  excision 10/2005  . SKIN BIOPSY     s/p right upper back 2009- benign Dr. Tonia Brooms    Family History  Problem Relation Age of Onset  . Colon polyps Mother   . Stroke Mother        due to ICA stenosis  . CVA Neg Hx   . Seizures Neg Hx    Social History:  reports that he quit smoking about 24 years ago. He quit after 10.00 years of use. He has never used smokeless tobacco. He reports that he drinks about 1.0 standard drinks of alcohol per week. He reports that he does not use drugs.  Allergies: No Known Allergies  Medications:  I reviewed home medications   ROS:                                                                                                                                     14 systems reviewed and negative except above   Examination:                                                                                                      General: Appears well-developed  Psych: Affect appropriate to situation Eyes: No scleral injection HENT: No OP obstrucion Head: Normocephalic.  Cardiovascular: Normal rate and regular rhythm.  Respiratory: Effort normal and breath sounds normal to anterior ascultation GI: Soft.  No distension. There is no tenderness.  Skin: WDI    Neurological Examination Mental Status: Alert, oriented, thought content appropriate.  Speech fluent without evidence of aphasia. Able to follow 3 step commands without difficulty. Cranial Nerves: II: Visual fields grossly normal,  III,IV, VI: ptosis not present, extra-ocular motions intact bilaterally, pupils equal, round, reactive to light and accommodation V,VII: right nasolabial fold flattening, reduced sensation on right side of face VIII: hearing normal bilaterally IX,X: uvula rises symmetrically XI: bilateral shoulder shrug XII: midline tongue extension Motor: Right : Upper  extremity   4+/5    Left:     Upper extremity   5/5  Lower extremity   4/5     Lower extremity   5/5 Tone and bulk:normal tone throughout; no atrophy noted Sensory: reduced to light touch over arm and leg on the right side in addition to face Deep Tendon Reflexes: 2+ and symmetric throughout Plantars: Right: downgoing   Left: downgoing Cerebellar: normal finger-to-nose, normal rapid alternating movements and normal heel-to-shin test Gait: did not assess    Lab Results: Basic Metabolic Panel: Recent Labs  Lab 06/22/18 1606  NA 138  K 4.2  CL 103  CO2 25  GLUCOSE 170*  BUN 18  CREATININE 0.99  CALCIUM 10.0    CBC: Recent Labs  Lab 06/22/18 1606  WBC 4.7  NEUTROABS 2.8  HGB 13.0  HCT 45.1  MCV 91.1  PLT 187    Coagulation Studies: Recent Labs    06/22/18 1606  LABPROT 15.1  INR 1.19    Imaging: Ct Head Wo Contrast  Result Date: 06/22/2018 CLINICAL DATA:  Right-sided weakness with right facial droop. EXAM: CT HEAD WITHOUT CONTRAST TECHNIQUE: Contiguous axial images were obtained from the base of the skull through the vertex without intravenous contrast. COMPARISON:  04/01/2018. FINDINGS: Brain: There is no evidence for acute hemorrhage, hydrocephalus, mass lesion, or abnormal extra-axial fluid collection. No definite CT evidence for acute infarction. Old right cerebellar infarct noted. Patchy low attenuation in the deep hemispheric and periventricular white matter is nonspecific, but likely reflects chronic microvascular ischemic demyelination. Vascular: No hyperdense vessel or unexpected calcification. Skull: No evidence for fracture. No worrisome lytic or sclerotic lesion. Sinuses/Orbits: The visualized paranasal sinuses and mastoid air cells are clear. Visualized portions of the globes and intraorbital fat are unremarkable. Other: None. IMPRESSION: 1. No acute intracranial abnormality. 2. Old right cerebellar infarct. 3. Chronic microvascular white matter disease  Electronically Signed   By: Misty Stanley M.D.   On: 06/22/2018 16:25     ASSESSMENT AND PLAN  82 y.o. male past medical history of hypertension, hyperlipidemia, diabetes mellitus, recent right parietal stroke in July 2019 with new diagnosis of paroxysmal atrial fibrillation this to the emergency room with a stroke.  MRI brain confirms the left coronary radiata, likely subcortical infarct.  I suspect the etiology of stroke is small vessel disease in the setting of multiple stroke risk factors and not failure of Eliquis in preventing a cardioembolic event.  I will defer to stroke team on whether we should add aspirin to his home regimen of Eliquis, this comes with significantly increased risk of bleeding.   Acute Ischemic Stroke - Left corona radiata infarction  Paroxysmal atrial fibrillation   Etiology of stroke: likely small vessel disease   Recommend # No need to repeat vascular imaging, recently performed in July #Transthoracic Echo - no need to repeat  # Continue Eliquis, size of infarction is small #Continue Atorvastatin 40 mg/other high intensity statin # BP goal: gradual reduction to normotension # HBAIC and Lipid profile # Telemetry monitoring # Frequent neuro checks # stroke swallow screen  Please page stroke NP  Or  PA  Or MD from 8am -4 pm  as this patient from this time will be  followed by the stroke.   You can look them up on [email or web address]  Password [facility]    Sushanth Aroor [facility] Pager Number [phone]

## 2018-06-23 ENCOUNTER — Observation Stay (HOSPITAL_COMMUNITY): Payer: Medicare HMO

## 2018-06-23 DIAGNOSIS — Z6827 Body mass index (BMI) 27.0-27.9, adult: Secondary | ICD-10-CM | POA: Diagnosis not present

## 2018-06-23 DIAGNOSIS — I63522 Cerebral infarction due to unspecified occlusion or stenosis of left anterior cerebral artery: Principal | ICD-10-CM

## 2018-06-23 DIAGNOSIS — I63512 Cerebral infarction due to unspecified occlusion or stenosis of left middle cerebral artery: Secondary | ICD-10-CM | POA: Diagnosis not present

## 2018-06-23 DIAGNOSIS — I480 Paroxysmal atrial fibrillation: Secondary | ICD-10-CM | POA: Diagnosis not present

## 2018-06-23 DIAGNOSIS — E119 Type 2 diabetes mellitus without complications: Secondary | ICD-10-CM | POA: Diagnosis not present

## 2018-06-23 DIAGNOSIS — M6281 Muscle weakness (generalized): Secondary | ICD-10-CM | POA: Diagnosis not present

## 2018-06-23 DIAGNOSIS — E663 Overweight: Secondary | ICD-10-CM | POA: Diagnosis not present

## 2018-06-23 DIAGNOSIS — R4781 Slurred speech: Secondary | ICD-10-CM | POA: Diagnosis not present

## 2018-06-23 DIAGNOSIS — I10 Essential (primary) hypertension: Secondary | ICD-10-CM

## 2018-06-23 DIAGNOSIS — I639 Cerebral infarction, unspecified: Secondary | ICD-10-CM

## 2018-06-23 DIAGNOSIS — R29810 Facial weakness: Secondary | ICD-10-CM | POA: Diagnosis not present

## 2018-06-23 DIAGNOSIS — N400 Enlarged prostate without lower urinary tract symptoms: Secondary | ICD-10-CM | POA: Diagnosis present

## 2018-06-23 DIAGNOSIS — I602 Nontraumatic subarachnoid hemorrhage from anterior communicating artery: Secondary | ICD-10-CM | POA: Diagnosis not present

## 2018-06-23 DIAGNOSIS — Z7984 Long term (current) use of oral hypoglycemic drugs: Secondary | ICD-10-CM | POA: Diagnosis not present

## 2018-06-23 DIAGNOSIS — Z87891 Personal history of nicotine dependence: Secondary | ICD-10-CM | POA: Diagnosis not present

## 2018-06-23 DIAGNOSIS — R2689 Other abnormalities of gait and mobility: Secondary | ICD-10-CM | POA: Diagnosis not present

## 2018-06-23 DIAGNOSIS — Z823 Family history of stroke: Secondary | ICD-10-CM | POA: Diagnosis not present

## 2018-06-23 DIAGNOSIS — I69051 Hemiplegia and hemiparesis following nontraumatic subarachnoid hemorrhage affecting right dominant side: Secondary | ICD-10-CM | POA: Diagnosis not present

## 2018-06-23 DIAGNOSIS — E785 Hyperlipidemia, unspecified: Secondary | ICD-10-CM | POA: Diagnosis not present

## 2018-06-23 DIAGNOSIS — Z7901 Long term (current) use of anticoagulants: Secondary | ICD-10-CM | POA: Diagnosis not present

## 2018-06-23 DIAGNOSIS — N529 Male erectile dysfunction, unspecified: Secondary | ICD-10-CM | POA: Diagnosis present

## 2018-06-23 DIAGNOSIS — Z8673 Personal history of transient ischemic attack (TIA), and cerebral infarction without residual deficits: Secondary | ICD-10-CM

## 2018-06-23 DIAGNOSIS — R2681 Unsteadiness on feet: Secondary | ICD-10-CM | POA: Diagnosis not present

## 2018-06-23 DIAGNOSIS — E1159 Type 2 diabetes mellitus with other circulatory complications: Secondary | ICD-10-CM | POA: Diagnosis not present

## 2018-06-23 DIAGNOSIS — R41841 Cognitive communication deficit: Secondary | ICD-10-CM | POA: Diagnosis not present

## 2018-06-23 DIAGNOSIS — G8191 Hemiplegia, unspecified affecting right dominant side: Secondary | ICD-10-CM | POA: Diagnosis not present

## 2018-06-23 DIAGNOSIS — E1151 Type 2 diabetes mellitus with diabetic peripheral angiopathy without gangrene: Secondary | ICD-10-CM | POA: Diagnosis not present

## 2018-06-23 DIAGNOSIS — I5189 Other ill-defined heart diseases: Secondary | ICD-10-CM

## 2018-06-23 DIAGNOSIS — E1165 Type 2 diabetes mellitus with hyperglycemia: Secondary | ICD-10-CM | POA: Diagnosis not present

## 2018-06-23 DIAGNOSIS — E1142 Type 2 diabetes mellitus with diabetic polyneuropathy: Secondary | ICD-10-CM | POA: Diagnosis not present

## 2018-06-23 DIAGNOSIS — R29704 NIHSS score 4: Secondary | ICD-10-CM | POA: Diagnosis not present

## 2018-06-23 DIAGNOSIS — N521 Erectile dysfunction due to diseases classified elsewhere: Secondary | ICD-10-CM | POA: Diagnosis not present

## 2018-06-23 DIAGNOSIS — I69322 Dysarthria following cerebral infarction: Secondary | ICD-10-CM | POA: Diagnosis not present

## 2018-06-23 DIAGNOSIS — R2990 Unspecified symptoms and signs involving the nervous system: Secondary | ICD-10-CM | POA: Diagnosis present

## 2018-06-23 DIAGNOSIS — R279 Unspecified lack of coordination: Secondary | ICD-10-CM | POA: Diagnosis not present

## 2018-06-23 LAB — GLUCOSE, CAPILLARY
GLUCOSE-CAPILLARY: 117 mg/dL — AB (ref 70–99)
Glucose-Capillary: 149 mg/dL — ABNORMAL HIGH (ref 70–99)
Glucose-Capillary: 215 mg/dL — ABNORMAL HIGH (ref 70–99)
Glucose-Capillary: 182 mg/dL — ABNORMAL HIGH (ref 70–99)

## 2018-06-23 LAB — LIPID PANEL
CHOL/HDL RATIO: 2.3 ratio
CHOLESTEROL: 95 mg/dL (ref 0–200)
HDL: 41 mg/dL (ref >40)
LDL Cholesterol: 42 mg/dL (ref 0–99)
TRIGLYCERIDES: 62 mg/dL (ref <150)
VLDL: 12 mg/dL (ref 0–40)

## 2018-06-23 MED ORDER — ASPIRIN EC 81 MG PO TBEC
81.0000 mg | DELAYED_RELEASE_TABLET | Freq: Every day | ORAL | Status: DC
  Administered 2018-06-24 – 2018-06-27 (×4): 81 mg via ORAL
  Filled 2018-06-23 (×4): qty 1

## 2018-06-23 MED ORDER — APIXABAN 5 MG PO TABS
5.0000 mg | ORAL_TABLET | Freq: Two times a day (BID) | ORAL | Status: DC
  Administered 2018-06-23 – 2018-06-27 (×8): 5 mg via ORAL
  Filled 2018-06-23 (×8): qty 1

## 2018-06-23 NOTE — Progress Notes (Signed)
Pt taken to MRI. Stable, no acute distress noted.

## 2018-06-23 NOTE — Evaluation (Signed)
Occupational Therapy Evaluation Patient Details Name: Brian [name] MRN: [id number] DOB: 09-23-1935 Today's Date: 06/23/2018    History of Present Illness  Brian [name] is a pleasant 82 year old male, recently widowed 11/14/2017, lives alone, ambulates with the help of a cane, right handed, PMH of DM 2, HTN, HLD, CVA 04/01/2018 with no residual weakness, recently diagnosed PAF by 30-day event monitor placed on outpatient and started on Eliquis which he reportedly started 06/19/2018, presented to [facility] ED on 06/22/2018 due to right-sided weakness, slurred speech and facial droop that began the day prior to admission. MRI 10/7 reveals acute infarct in the left corona radiata.   Clinical Impression   Pt admitted with the above diagnosis and has the deficits outlined below. Pt would benefit from cont OT to increase independence with basic adls and adl transfers so pt can eventually d/c back to his home.  Pts home is a tri-level with several stairs, pt has assist but not 24/7 assist and feel pt is a huge fall risk at this point in time.  Feel pt would benefit from rehab prior to returning home.  Feel he could reach a mod I level of care if pt spends some time learning techniques on rehab.  Currently, pt can do most adls in sitting with supervision, but once on his feet, pt requires at lease mod assist with adls and becomes a fall risk.     Follow Up Recommendations  CIR    Equipment Recommendations  3 in 1 bedside commode    Recommendations for Other Services Rehab consult     Precautions / Restrictions Precautions Precautions: Fall Restrictions Weight Bearing Restrictions: No      Mobility Bed Mobility Overal bed mobility: Needs Assistance Bed Mobility: Supine to Sit     Supine to sit: Min guard     General bed mobility comments: Increased time, with noted difficulty getting R foot untangled from bedclothes  Transfers Overall transfer level: Needs assistance Equipment used: 1 person  hand held assist Transfers: Sit to/from Stand Sit to Stand: Mod assist         General transfer comment: Heavy mod assist for balance and to power up; wide base of support with significant R lean; Naturally tending to reach out for UE support    Balance Overall balance assessment: Needs assistance Sitting-balance support: No upper extremity supported;Feet supported Sitting balance-Leahy Scale: Good     Standing balance support: During functional activity Standing balance-Leahy Scale: Poor                             ADL either performed or assessed with clinical judgement   ADL Overall ADL's : Needs assistance/impaired Eating/Feeding: Minimal assistance;Sitting Eating/Feeding Details (indicate cue type and reason): Pt using LUE for most adl tasks. pt can use RUE but is very uncoordinated.  Encouraged him to use RUE when possible. Grooming: Oral care;Wash/dry hands;Minimal assistance;Standing;Cueing for safety Grooming Details (indicate cue type and reason): Pt stood at sink to groom for appx 4 minutes with min assist. Pt with lean to the Right when standing requiring physical assist to correct.  Pt attempted to brush teeth with RUE with cues but switched to the Left as he was very uncoordinated.   Upper Body Bathing: Minimal assistance;Sitting   Lower Body Bathing: Moderate assistance;Sit to/from stand;Cueing for safety;Cueing for compensatory techniques Lower Body Bathing Details (indicate cue type and reason): Pt can bathe in sitting with very little assist  but once on his feet requires a great amout of assist to safely complete the task. Upper Body Dressing : Minimal assistance;Sitting Upper Body Dressing Details (indicate cue type and reason): assist with fasteners. Lower Body Dressing: Minimal assistance;Sit to/from stand;Cueing for compensatory techniques;Cueing for safety Lower Body Dressing Details (indicate cue type and reason): Pt was able to donn shoes and  socks in sitting and tie shoes.  Once pt on his feet, pt requires assist to pull pants up and fasten pants.  Pt has a very small base of support and requires hands on assist any time he is on his feet. Pt is a fall risk.    Toilet Transfer: Moderate assistance;Ambulation;Regular Toilet;Grab bars Toilet Transfer Details (indicate cue type and reason): Pt requires a great amount of assist toileting as he is unsteady on his feet and a fall risk in the bathroom. Toileting- Clothing Manipulation and Hygiene: Moderate assistance;Sit to/from stand;Cueing for safety;Cueing for compensatory techniques       Functional mobility during ADLs: Moderate assistance General ADL Comments: Pt does ok with adls when in sitting.  As soon as he transfers to standing pt becomes a fall risk.  Pt unable to spit his attention between two things when walking.  If walking and talking his base of support became small and he would become a greater fall risk.     Vision Baseline Vision/History: Wears glasses Wears Glasses: At all times Patient Visual Report: No change from baseline Vision Assessment?: No apparent visual deficits     Perception Perception Perception Tested?: No   Praxis Praxis Praxis tested?: Within functional limits    Pertinent Vitals/Pain Pain Assessment: No/denies pain     Hand Dominance Right   Extremity/Trunk Assessment Upper Extremity Assessment Upper Extremity Assessment: RUE deficits/detail RUE Deficits / Details: ROM WFL  Strength: Shoulder 4-/5, biceps 4/5, triceps 4-/5, grip 4-/5 RUE Sensation: decreased light touch RUE Coordination: decreased fine motor   Lower Extremity Assessment Lower Extremity Assessment: Defer to PT evaluation RLE Deficits / Details: hip flexion 3/5, quad 3+/5, ankle dorsiflexion 3+/5 (tested seated)   Cervical / Trunk Assessment Cervical / Trunk Assessment: Normal   Communication Communication Communication: No difficulties   Cognition  Arousal/Alertness: Awake/alert Behavior During Therapy: WFL for tasks assessed/performed Overall Cognitive Status: Impaired/Different from baseline Area of Impairment: Attention;Safety/judgement;Awareness;Problem solving                   Current Attention Level: Sustained     Safety/Judgement: Decreased awareness of safety;Decreased awareness of deficits Awareness: Emergent Problem Solving: Slow processing;Decreased initiation;Requires verbal cues;Difficulty sequencing General Comments: Difficulty with dual-tasking; He shows awareness of how different and how much difficulty he has with walking, but attributes this to not getting OOB overnight since he has been in the hospital; He is very disappointed that he cannot get home today -- noting difficulty making the connection that if he requires 2-person assist to walk in the room, he cannot go home safely by himself   General Comments  Pt aware that it required a lot of assist to get him to the sink to groom but was unable to appy this awareness to understanding why it is unsafe for him to go  home at this time.  Pt is often alone therefore is a huge fall risk at this point.  Feel with some rehab, he could return home with PRN assist from family.    Exercises     Shoulder Instructions      Home Living Family/patient  expects to be discharged to:: Private residence Living Arrangements: Alone Available Help at Discharge: Family;Available PRN/intermittently Type of Home: House Home Access: Stairs to enter [employer] of Steps: 2-3 Entrance Stairs-Rails: Right Home Layout: Two level Alternate Level Stairs-Number of Steps: flight  Alternate Level Stairs-Rails: Right Bathroom Shower/Tub: Occupational psychologist: Standard     Home Equipment: Cane - single point;Walker - 2 wheels          Prior Functioning/Environment Level of Independence: Independent;Independent with assistive device(s)         Comments: Pt uses can consistently.  Pt does not drive and has not since CVA in July.  Is eager to drive.        OT Problem List: Decreased strength;Impaired balance (sitting and/or standing);Decreased cognition;Decreased knowledge of use of DME or AE;Decreased knowledge of precautions;Impaired UE functional use      OT Treatment/Interventions: Self-care/ADL training;Therapeutic activities;Balance training    OT Goals(Current goals can be found in the care plan section) Acute Rehab OT Goals Patient Stated Goal: Wants to be able to manage better OT Goal Formulation: With patient Time For Goal Achievement: 07/07/18 Potential to Achieve Goals: Good ADL Goals Pt Will Perform Eating: with modified independence;sitting Pt Will Perform Grooming: with supervision;standing Pt Will Perform Lower Body Dressing: with supervision;sit to/from stand Pt Will Transfer to Toilet: with min guard assist;ambulating;grab bars Pt Will Perform Toileting - Clothing Manipulation and hygiene: with supervision;sit to/from stand Pt Will Perform Tub/Shower Transfer: Shower transfer;3 in 1;with supervision;ambulating Pt/caregiver will Perform Home Exercise Program: Increased strength;Right Upper extremity;With theraputty;With Supervision  OT Frequency: Min 3X/week   Barriers to D/C: Decreased caregiver support  pt lives alone.  Has supportive family but does not have long term 24/7 assist.        Co-evaluation PT/OT/SLP Co-Evaluation/Treatment: Yes Reason for Co-Treatment: To address functional/ADL transfers;Complexity of the patient's impairments (multi-system involvement) PT goals addressed during session: Mobility/safety with mobility;Balance OT goals addressed during session: ADL's and self-care      AM-PAC PT "6 Clicks" Daily Activity     Outcome Measure Help from another person eating meals?: A Little Help from another person taking care of personal grooming?: A Little Help from another person  toileting, which includes using toliet, bedpan, or urinal?: A Lot Help from another person bathing (including washing, rinsing, drying)?: A Little Help from another person to put on and taking off regular upper body clothing?: A Little Help from another person to put on and taking off regular lower body clothing?: A Little 6 Click Score: 17   End of Session Equipment Utilized During Treatment: Gait belt Nurse Communication: Mobility status  Activity Tolerance: Patient tolerated treatment well Patient left: in chair;with call bell/phone within reach;with chair alarm set  OT Visit Diagnosis: Unsteadiness on feet (R26.81)                Time: 4081-4481 OT Time Calculation (min): 32 min Charges:  OT General Charges $OT Visit: 1 Visit OT Evaluation $OT Eval Moderate Complexity: 1 [address] OTR/L [phone]  Glenford Peers 06/23/2018, 11:51 AM

## 2018-06-23 NOTE — H&P (Signed)
History and Physical    Brian [name] XBM:[id number] DOB: December 07, 1935 DOA: 06/22/2018  PCP: [name], MD  Patient coming from: Home.  Being admitted from observation status to inpatient status  I have personally briefly reviewed patient's old medical records in [place]  Chief Complaint: Acute left anterior communicating artery stroke affecting corona radiata  HPI: Brian [name] is a 82 y.o. male recently widowed 11/14/2017, lives alone, ambulates with the help of a cane, right handed, PMH of DM 2, HTN, HLD, CVA 04/01/2018 with no residual weakness, recently diagnosed PAF by 30-day event monitor placed on outpatient and started on Eliquis which he reportedly started 06/19/2018, presented to [facility] ED on 06/22/2018 due to right-sided weakness, slurred speech and facial droop that began the day prior to admission.  On the day prior to admission the patient noted some unsteadiness when coming out of the eyeglass store he was unable to walk normally and had to think about lifting up his leg and putting it down and was quite unsteady.  He went home he became weak in the right upper extremity.  He did not seek any medical attention at that time.  On the day of admission he looked into the mirror and found that his face had a droop towards the right.  He is drooling saliva.  And he decided to come to the emergency department.  Emergency department CT scan was unremarkable and patient was seen By neurology who recommended stroke work-up no need to repeat vascular imaging has not had been performed in July.  Eliquis was continued.  Permissive hypertension was started.  MRI did show an acute left anterior communicating artery stroke affecting the corona radiata.  His occult therapy has seen the patient and has recommended acute inpatient rehab.  Patient is being admitted into the hospital for further evaluation and management of acute stroke.  Review of Systems: As per HPI otherwise all other systems  reviewed and  negative.   Past Medical History:  Diagnosis Date  . ALLERGIC RHINITIS 10/13/2007  . ANEMIA, CHRONIC DISEASE NEC 03/31/2007  . BENIGN PROSTATIC HYPERTROPHY 03/31/2007  . Chest pain    Stress echo, normal, December, 2012  . Chronic anemia   . DIABETES MELLITUS, TYPE II 03/31/2007  . [place] DISEASE, LUMBAR 03/31/2007  . ED (erectile dysfunction)   . Ejection fraction    EF  normal, stress echo, December,  2012  . History of prostatitis   . HYPERLIPIDEMIA 03/31/2007  . HYPERTENSION 03/31/2007  . Nephrolithiasis 12/07/2010  . Obesity   . POLYP, ANAL AND RECTAL 03/31/2007  . Right eye trauma    hx as child  . Stroke [facility])     Past Surgical History:  Procedure Laterality Date  . [place] SURGERY     1999  . PROSTATE BIOPSY     s/p  . RECTAL POLYPECTOMY     Transanal excision 10/2005  . SKIN BIOPSY     s/p right upper back 2009- benign Dr. Tonia Brooms    Social History   Social History Narrative  . Not on file     reports that he quit smoking about 24 years ago. He quit after 10.00 years of use. He has never used smokeless tobacco. He reports that he drinks about 1.0 standard drinks of alcohol per week. He reports that he does not use drugs.  No Known Allergies  Family History  Problem Relation Age of Onset  . Colon polyps Mother   .  Stroke Mother        due to ICA stenosis  . CVA Neg Hx   . Seizures Neg Hx      Prior to Admission medications   Medication Sig Start Date End Date Taking? Authorizing Provider  amLODipine (NORVASC) 5 MG tablet Take 1 tablet (5 mg total) by mouth daily. 06/05/18 06/05/19 Yes [name], MD  apixaban (ELIQUIS) 5 MG TABS tablet Take 1 tablet (5 mg total) by mouth 2 (two) times daily. Patient taking differently: Take 5 mg by mouth 2 (two) times daily with a meal. 8am and 6pm 06/12/18  Yes Sherran Needs, NP  atorvastatin (LIPITOR) 80 MG tablet Take 1 tablet (80 mg total) by mouth daily at 6 PM. 06/05/18  Yes [name], MD    fluticasone [facility]) 50 MCG/ACT nasal spray Place 2 sprays into both nostrils daily as needed. Patient taking differently: Place 2 sprays into both nostrils daily as needed for allergies or rhinitis.  04/08/18  Yes [name], MD  gabapentin (NEURONTIN) 100 MG capsule TAKE 1 CAPSULE BY MOUTH THREE TIMES A DAY Patient taking differently: Take 100 mg by mouth 3 (three) times daily. 8am, 12pm, and bedtime (10pm) 06/18/18  Yes Rosemarie Ax, MD  losartan (COZAAR) 100 MG tablet Take 1 tablet (100 mg total) by mouth daily. 06/05/18  Yes [name], MD  metFORMIN (GLUCOPHAGE-XR) 500 MG 24 hr tablet Take 2 tablets (1,000 mg total) by mouth daily with supper. 04/08/18  Yes [name], MD  OVER THE COUNTER MEDICATION Place 1 drop into both eyes daily as needed (dry eyes). Over the counter lubricating eye drop   Yes [provider]  sitaGLIPtin (JANUVIA) 100 MG tablet Take 1 tablet (100 mg total) by mouth daily. Patient taking differently: Take 100 mg by mouth daily with lunch.  04/08/18  Yes [name], MD  ACCU-CHEK SOFTCLIX LANCETS lancets Use as instructed twice per day Dx Code E11.9 12/19/17   Philemon Kingdom, MD  glucose blood (ACCU-CHEK AVIVA) test strip Use as instructed twice per day Dx E11.9 12/19/17   Philemon Kingdom, MD    Physical Exam:  Constitutional: NAD, calm, comfortable Vitals:   06/23/18 0000 06/23/18 0642 06/23/18 1454 06/23/18 1636  BP: (!) 167/86 (!) 146/80 (!) 159/98 (!) 171/90  Pulse: 61 (!) 57 75 76  Resp: 18 16 18 18   Temp: 98.3 F (36.8 C) 97.9 F (36.6 C) 97.8 F (36.6 C) 98.8 F (37.1 C)  TempSrc: Oral Oral Oral Oral  SpO2: 98% 99% 100% 100%  Weight:      Height:       Eyes: PERRL, lids and conjunctivae normal ENMT: Mucous membranes are moist. Posterior pharynx clear of any exudate or lesions.Normal dentition.  Neck: normal, supple, no masses, no thyromegaly Respiratory: clear to auscultation bilaterally, no wheezing, no crackles. Normal  respiratory effort. No accessory muscle use.  Cardiovascular: Regular rate and rhythm, no murmurs / rubs / gallops. No extremity edema. 2+ pedal pulses. No carotid bruits.  Abdomen: no tenderness, no masses palpated. No hepatosplenomegaly. Bowel sounds positive.  Musculoskeletal: no clubbing / cyanosis. No joint deformity upper and lower extremities. Good ROM, no contractures. Normal muscle tone.  Skin: no rashes, lesions, ulcers. No induration Neurologic: CN 2-12 grossly intact.  No obvious nasolabial fold issues, some right pronator drift.  Speech is more fluent than yesterday. Psychiatric: Normal judgment and insight. Alert and oriented x 3. Normal mood.   Labs on Admission:  I have personally reviewed following labs and imaging studies  CBC: Recent Labs  Lab 06/22/18 1606  WBC 4.7  NEUTROABS 2.8  HGB 13.0  HCT 45.1  MCV 91.1  PLT 350   Basic Metabolic Panel: Recent Labs  Lab 06/22/18 1606  NA 138  K 4.2  CL 103  CO2 25  GLUCOSE 170*  BUN 18  CREATININE 0.99  CALCIUM 10.0   GFR: Estimated Creatinine Clearance: 72.6 mL/min (by C-G formula based on SCr of 0.99 mg/dL). Liver Function Tests: Recent Labs  Lab 06/22/18 1606  AST 32  ALT 21  ALKPHOS 60  BILITOT 1.0  PROT 7.3  ALBUMIN 4.4   No results for input(s): LIPASE, AMYLASE in the last 168 hours. No results for input(s): AMMONIA in the last 168 hours. Coagulation Profile: Recent Labs  Lab 06/22/18 1606  INR 1.19   Cardiac Enzymes: No results for input(s): CKTOTAL, CKMB, CKMBINDEX, TROPONINI in the last 168 hours. BNP (last 3 results) No results for input(s): PROBNP in the last 8760 hours. HbA1C: No results for input(s): HGBA1C in the last 72 hours. CBG: Recent Labs  Lab 06/22/18 1545 06/22/18 1946 06/23/18 0735 06/23/18 1311 06/23/18 1606  GLUCAP 155* 109* 149* 215* 117*   Lipid Profile: Recent Labs    06/23/18 0700  CHOL 95  HDL 41  LDLCALC 42  TRIG 62  CHOLHDL 2.3   Thyroid Function  Tests: No results for input(s): TSH, T4TOTAL, FREET4, T3FREE, THYROIDAB in the last 72 hours. Anemia Panel: No results for input(s): VITAMINB12, FOLATE, FERRITIN, TIBC, IRON, RETICCTPCT in the last 72 hours. Urine analysis:    Component Value Date/Time   COLORURINE YELLOW 04/01/2018 1148   APPEARANCEUR CLEAR 04/01/2018 1148   LABSPEC 1.029 04/01/2018 1148   PHURINE 5.0 04/01/2018 1148   GLUCOSEU 50 (A) 04/01/2018 1148   GLUCOSEU 500 (A) 07/18/2017 1122   HGBUR NEGATIVE 04/01/2018 1148   BILIRUBINUR NEGATIVE 04/01/2018 1148   [place] 04/01/2018 1148   PROTEINUR NEGATIVE 04/01/2018 1148   UROBILINOGEN 0.2 07/18/2017 1122   NITRITE NEGATIVE 04/01/2018 1148   LEUKOCYTESUR NEGATIVE 04/01/2018 1148    Radiological Exams on Admission: Ct Head Wo Contrast  Result Date: 06/22/2018 CLINICAL DATA:  Right-sided weakness with right facial droop. EXAM: CT HEAD WITHOUT CONTRAST TECHNIQUE: Contiguous axial images were obtained from the base of the skull through the vertex without intravenous contrast. COMPARISON:  04/01/2018. FINDINGS: Brain: There is no evidence for acute hemorrhage, hydrocephalus, mass lesion, or abnormal extra-axial fluid collection. No definite CT evidence for acute infarction. Old right cerebellar infarct noted. Patchy low attenuation in the deep hemispheric and periventricular white matter is nonspecific, but likely reflects chronic microvascular ischemic demyelination. Vascular: No hyperdense vessel or unexpected calcification. Skull: No evidence for fracture. No worrisome lytic or sclerotic lesion. Sinuses/Orbits: The visualized paranasal sinuses and mastoid air cells are clear. Visualized portions of the globes and intraorbital fat are unremarkable. Other: None. IMPRESSION: 1. No acute intracranial abnormality. 2. Old right cerebellar infarct. 3. Chronic microvascular white matter disease Electronically Signed   By: Misty Stanley M.D.   On: 06/22/2018 16:25   Mr Brain  Wo Contrast (neuro Protocol)  Result Date: 06/23/2018 CLINICAL DATA:  Focal neuro deficit. Right-sided weakness with slurred speech and facial droop EXAM: MRI HEAD WITHOUT CONTRAST TECHNIQUE: Multiplanar, multiecho pulse sequences of the brain and surrounding structures were obtained without intravenous contrast. COMPARISON:  04/01/2018 brain MRI.  Head CT from yesterday FINDINGS: Brain: 1 cm acute infarct in the  central left corona radiata. Increased diffusion signal in the right cerebral white matter is facilitated by ADC map and related to remote lacunar infarct. Chronic small vessel ischemia causes confluent gliosis in the deep cerebral white matter. Remote small vessel infarct in the right cerebellum. Age normal brain volume. No blood products, hydrocephalus, or masslike finding. Vascular: Major flow voids are preserved Skull and upper cervical spine: No evidence of marrow lesion Sinuses/Orbits: Bilateral cataract resection IMPRESSION: 1. 1 cm acute infarct in the left corona radiata. 2. Chronic small vessel ischemia in the cerebral white matter. Electronically Signed   By: Monte Fantasia M.D.   On: 06/23/2018 01:58    Assessment/Plan Principal Problem:   Acute ischemic left ACA stroke (HCC) Active Problems:   Hyperlipidemia   Hypertension   Uncontrolled type 2 diabetes mellitus with circulatory disorder causing erectile dysfunction (HCC)   PAF (paroxysmal atrial fibrillation) (HCC)   Diastolic dysfunction   Overweight (BMI 25.0-29.9)   History of CVA (cerebrovascular accident)  1. Acute ischemic left ACA stroke: With residual right hemiparesis, dysarthria and facial droop.  Etiology: Likely embolic due to PAF.   Deferred carotid Dopplers, 2D echo because he recently completed stroke work-up in July 2019 .  On 7/17, LDL 152 and A1c 6.4.    Lipid panel unremarkable.  Hemoglobin A1c is pending.  As per A. fib office visit 9/18, a 30-day event monitor placed on the patient had shown an episode of  A. fib, patient however asymptomatic of A. fib, aspirin and Plavix were discontinued and he recently started Eliquis and took last dose this morning.  Eliquis restarted today.  2. Essential hypertension: Allow permissive hypertension due to acute stroke.    Continue to hold amlodipine and losartan. 3. Type II DM with peripheral neuropathy: Hold oral hypoglycemics.  SSI.  Repeat A1c is pending 4. Hyperlipidemia: LDL as noted above.  Continue atorvastatin 80 mg daily. 5. PAF: Currently in sinus rhythm.  Not on rate control medications PTA.  Eliquis on hold until size of stroke clearly defined. 6. Gait difficulty: She will require inpatient acute rehabilitation.  Rehab has been consulted.   DVT prophylaxis:  Eliquis Code Status: Full Family Communication: None at bedside Disposition Plan: DC home pending clinical improvement and therapies evaluation. Consults called: Neurology Admission status: Telemetry,  will admit to inpatient status from observation    Lady Deutscher MD [facility] Pager [phone]  If 7PM-7AM, please contact night-coverage [email or web address] Password [email or web address]  06/23/2018, 5:57 PM

## 2018-06-23 NOTE — Progress Notes (Signed)
Rehab Admissions Coordinator Note:  Patient was screened by Cleatrice Burke for appropriateness for an Inpatient Acute Rehab Consult per PT and OT recommendations. At this time, we are recommending Inpatient Rehab consult. I will contact attending for order.  Danne Baxter, RN, MSN Rehab Admissions Coordinator [phone] 06/23/2018 11:15 AM

## 2018-06-23 NOTE — Consult Note (Signed)
Physical Medicine and Rehabilitation Consult Reason for Consult: Right side weakness and slurred speech Referring Physician: Internal medicine   HPI: Brian [name] is a 82 y.o. right-handed male with history of diabetes mellitus, hypertension, CVA 04/01/2018 with no residual weakness recently diagnosed with PAF by 30-day event monitor placed on his outpatient started on Eliquis 06/19/2018.  Per chart review, brother, and patient, patient lives alone.  Independent with a cane prior to admission.  Multiple siblings in the area that check on him but cannot provide 24-hour assistance.  Presented 06/22/2018 with right-sided weakness slurred speech and facial droop.  Cranial CT scan reviewed, unremarkable for acute intracranial process.  Per report, old right cerebellar infarction.  MRI of the brain showed a 1 cm acute infarction left corona radiata.  Echocardiogram from July 2019 showing ejection fraction of 73% grade 1 diastolic dysfunction.  Patient currently maintained on aspirin as well as Eliquis for CVA prophylaxis.  Tolerating a regular diet.  Therapy evaluations completed with recommendations of physical medicine rehab consult.   Review of Systems  Constitutional: Negative for chills and fever.  HENT: Negative for hearing loss.   Eyes: Negative for blurred vision and double vision.  Respiratory: Negative for cough and shortness of breath.   Cardiovascular: Negative for chest pain and leg swelling.  Gastrointestinal: Positive for constipation. Negative for nausea and vomiting.  Genitourinary: Positive for urgency. Negative for dysuria and hematuria.  Musculoskeletal: Positive for myalgias.  Skin: Negative for rash.  Neurological: Positive for sensory change, speech change and focal weakness.  All other systems reviewed and are negative.  Past Medical History:  Diagnosis Date  . ALLERGIC RHINITIS 10/13/2007  . ANEMIA, CHRONIC DISEASE NEC 03/31/2007  . BENIGN PROSTATIC HYPERTROPHY  03/31/2007  . Chest pain    Stress echo, normal, December, 2012  . Chronic anemia   . DIABETES MELLITUS, TYPE II 03/31/2007  . [place] DISEASE, LUMBAR 03/31/2007  . ED (erectile dysfunction)   . Ejection fraction    EF  normal, stress echo, December,  2012  . History of prostatitis   . HYPERLIPIDEMIA 03/31/2007  . HYPERTENSION 03/31/2007  . Nephrolithiasis 12/07/2010  . Obesity   . POLYP, ANAL AND RECTAL 03/31/2007  . Right eye trauma    hx as child  . Stroke [facility])    Past Surgical History:  Procedure Laterality Date  . [place] SURGERY     1999  . PROSTATE BIOPSY     s/p  . RECTAL POLYPECTOMY     Transanal excision 10/2005  . SKIN BIOPSY     s/p right upper back 2009- benign Dr. Tonia Brooms   Family History  Problem Relation Age of Onset  . Colon polyps Mother   . Stroke Mother        due to ICA stenosis  . CVA Neg Hx   . Seizures Neg Hx    Social History:  reports that he quit smoking about 24 years ago. He quit after 10.00 years of use. He has never used smokeless tobacco. He reports that he drinks about 1.0 standard drinks of alcohol per week. He reports that he does not use drugs. Allergies: No Known Allergies Medications Prior to Admission  Medication Sig Dispense Refill  . amLODipine (NORVASC) 5 MG tablet Take 1 tablet (5 mg total) by mouth daily. 90 tablet 3  . apixaban (ELIQUIS) 5 MG TABS tablet Take 1 tablet (5 mg total) by mouth 2 (two) times daily. (Patient taking differently: Take  5 mg by mouth 2 (two) times daily with a meal. 8am and 6pm) 60 tablet 6  . atorvastatin (LIPITOR) 80 MG tablet Take 1 tablet (80 mg total) by mouth daily at 6 PM. 90 tablet 1  . fluticasone (FLONASE) 50 MCG/ACT nasal spray Place 2 sprays into both nostrils daily as needed. (Patient taking differently: Place 2 sprays into both nostrils daily as needed for allergies or rhinitis. ) 16 g 5  . gabapentin (NEURONTIN) 100 MG capsule TAKE 1 CAPSULE BY MOUTH THREE TIMES A DAY (Patient taking  differently: Take 100 mg by mouth 3 (three) times daily. 8am, 12pm, and bedtime (10pm)) 90 capsule 3  . losartan (COZAAR) 100 MG tablet Take 1 tablet (100 mg total) by mouth daily. 90 tablet 1  . metFORMIN (GLUCOPHAGE-XR) 500 MG 24 hr tablet Take 2 tablets (1,000 mg total) by mouth daily with supper. 180 tablet 2  . OVER THE COUNTER MEDICATION Place 1 drop into both eyes daily as needed (dry eyes). Over the counter lubricating eye drop    . sitaGLIPtin (JANUVIA) 100 MG tablet Take 1 tablet (100 mg total) by mouth daily. (Patient taking differently: Take 100 mg by mouth daily with lunch. ) 90 tablet 2  . ACCU-CHEK SOFTCLIX LANCETS lancets Use as instructed twice per day Dx Code E11.9 200 each 3  . glucose blood (ACCU-CHEK AVIVA) test strip Use as instructed twice per day Dx E11.9 200 each 3    Home: [place] expects to be discharged to:: Private residence Living Arrangements: Alone Available Help at Discharge: Family, Available PRN/intermittently(Brother provides intermittent assist) Type of Home: House Home Access: Stairs to enter Technical brewer of Steps: 2-3 Entrance Stairs-Rails: Right Home Layout: Two level(tri-level) Alternate Level Stairs-Number of Steps: flight  Alternate Level Stairs-Rails: Right Bathroom Shower/Tub: Multimedia programmer: Standard Home Equipment: Cane - single point(Cane with small standing base)  Functional History: Prior Function Level of Independence: Independent, Independent with assistive device(s) Comments: Reports uses his cane consistently; has had a cane since he has had knee pain; at one point, he briefly indicated he has a RW (would need to be verified Functional Status:  Mobility: Bed Mobility Overal bed mobility: Needs Assistance Bed Mobility: Supine to Sit Supine to sit: Min guard General bed mobility comments: Increased time, with noted difficulty getting R foot untangled from bedclothes Transfers Overall  transfer level: Needs assistance Equipment used: 1 person hand held assist(second person standing by for safety) Transfers: Sit to/from Stand Sit to Stand: Mod assist General transfer comment: Heavy mod assist for balance and to power up; wide base of support with significant R lean; Naturally tending to reach out for UE support Ambulation/Gait Ambulation/Gait assistance: +2 physical assistance, Mod assist Gait Distance (Feet): (in room and hallway amb) Assistive device: 2 person hand held assist Gait Pattern/deviations: Decreased step length - right, Decreased dorsiflexion - right, Decreased stride length, Narrow base of support, Shuffle General Gait Details: Initially with significant R lean; Tending to reach out for bilateral UE support; Decr R step length, decr dorsiflexion R with occasional foot clearance difficulty; Noted difficulty with dual-tasking, with notably decr R step length while look to read signs in the hallway    ADL:    Cognition: Cognition Overall Cognitive Status: Impaired/Different from baseline Orientation Level: Oriented X4 Cognition Arousal/Alertness: Awake/alert Behavior During Therapy: WFL for tasks assessed/performed Overall Cognitive Status: Impaired/Different from baseline Area of Impairment: Safety/judgement, Awareness, Attention Current Attention Level: Sustained Safety/Judgement: Decreased awareness of safety, Decreased awareness  of deficits Awareness: Emergent General Comments: Difficulty with dual-tasking; He shows awareness of how different and how much difficulty he has with walking, but attributes this to not getting OOB overnight since he has been in the hospital; He is very disappointed that he cannot get home today -- noting difficulty making the connection that if he requires 2-person assist to walk in the room, he cannot go home safely by himself  Blood pressure (!) 146/80, pulse (!) 57, temperature 97.9 F (36.6 C), temperature source Oral,  resp. rate 16, height 6\' 1"  (1.854 m), weight 96 kg, SpO2 99 %. Physical Exam  Vitals reviewed. Constitutional: He is oriented to person, place, and time. He appears well-developed and well-nourished.  HENT:  Head: Normocephalic and atraumatic.  Eyes: EOM are normal. Right eye exhibits no discharge. Left eye exhibits no discharge.  Neck: Normal range of motion. Neck supple. No thyromegaly present.  Cardiovascular: Normal rate and regular rhythm.  Respiratory: Effort normal and breath sounds normal. No respiratory distress.  GI: Soft. Bowel sounds are normal. He exhibits no distension.  Musculoskeletal:  No edema or tenderness in extremities  Neurological: He is alert and oriented to person, place, and time.  Speech is mildly dysarthric but intelligible.   Follows commands Right facial weakness Motor: LUE/LLE: 5/5 proximal to distal RUE/RLE: 4/5 proximal to distal with apraxia Sensation diminished to light touch RUE/RLE  Skin: Skin is warm and dry.  Psychiatric: He has a normal mood and affect. His behavior is normal. Thought content normal.    Results for orders placed or performed during the hospital encounter of 06/22/18 (from the past 24 hour(s))  CBG monitoring, ED     Status: Abnormal   Collection Time: 06/22/18  3:45 PM  Result Value Ref Range   Glucose-Capillary 155 (H) 70 - 99 mg/dL   Comment 1 Notify RN   I-stat troponin, ED     Status: None   Collection Time: 06/22/18  4:04 PM  Result Value Ref Range   Troponin i, poc 0.00 0.00 - 0.08 ng/mL   Comment 3          Protime-INR     Status: None   Collection Time: 06/22/18  4:06 PM  Result Value Ref Range   Prothrombin Time 15.1 11.4 - 15.2 seconds   INR 1.19   APTT     Status: Abnormal   Collection Time: 06/22/18  4:06 PM  Result Value Ref Range   aPTT 37 (H) 24 - 36 seconds  CBC     Status: Abnormal   Collection Time: 06/22/18  4:06 PM  Result Value Ref Range   WBC 4.7 4.0 - 10.5 K/uL   RBC 4.95 4.22 - 5.81  MIL/uL   Hemoglobin 13.0 13.0 - 17.0 g/dL   HCT 45.1 39.0 - 52.0 %   MCV 91.1 78.0 - 100.0 fL   MCH 26.3 26.0 - 34.0 pg   MCHC 28.8 (L) 30.0 - 36.0 g/dL   RDW 13.2 11.5 - 15.5 %   Platelets 187 150 - 400 K/uL  Differential     Status: None   Collection Time: 06/22/18  4:06 PM  Result Value Ref Range   Neutrophils Relative % 60 %   Neutro Abs 2.8 1.7 - 7.7 K/uL   Lymphocytes Relative 26 %   Lymphs Abs 1.2 0.7 - 4.0 K/uL   Monocytes Relative 10 %   Monocytes Absolute 0.5 0.1 - 1.0 K/uL   Eosinophils Relative 2 %  Eosinophils Absolute 0.1 0.0 - 0.7 K/uL   Basophils Relative 1 %   Basophils Absolute 0.0 0.0 - 0.1 K/uL   Immature Granulocytes 1 %   Abs Immature Granulocytes 0.0 0.0 - 0.1 K/uL  Comprehensive metabolic panel     Status: Abnormal   Collection Time: 06/22/18  4:06 PM  Result Value Ref Range   Sodium 138 135 - 145 mmol/L   Potassium 4.2 3.5 - 5.1 mmol/L   Chloride 103 98 - 111 mmol/L   CO2 25 22 - 32 mmol/L   Glucose, Bld 170 (H) 70 - 99 mg/dL   BUN 18 8 - 23 mg/dL   Creatinine, Ser 0.99 0.61 - 1.24 mg/dL   Calcium 10.0 8.9 - 10.3 mg/dL   Total Protein 7.3 6.5 - 8.1 g/dL   Albumin 4.4 3.5 - 5.0 g/dL   AST 32 15 - 41 U/L   ALT 21 0 - 44 U/L   Alkaline Phosphatase 60 38 - 126 U/L   Total Bilirubin 1.0 0.3 - 1.2 mg/dL   GFR calc non Af Amer >60 >60 mL/min   GFR calc Af Amer >60 >60 mL/min   Anion gap 10 5 - 15  Glucose, capillary     Status: Abnormal   Collection Time: 06/22/18  7:46 PM  Result Value Ref Range   Glucose-Capillary 109 (H) 70 - 99 mg/dL  Lipid panel     Status: None   Collection Time: 06/23/18  7:00 AM  Result Value Ref Range   Cholesterol 95 0 - 200 mg/dL   Triglycerides 62 <150 mg/dL   HDL 41 >40 mg/dL   Total CHOL/HDL Ratio 2.3 RATIO   VLDL 12 0 - 40 mg/dL   LDL Cholesterol 42 0 - 99 mg/dL  Glucose, capillary     Status: Abnormal   Collection Time: 06/23/18  7:35 AM  Result Value Ref Range   Glucose-Capillary 149 (H) 70 - 99 mg/dL    Ct Head Wo Contrast  Result Date: 06/22/2018 CLINICAL DATA:  Right-sided weakness with right facial droop. EXAM: CT HEAD WITHOUT CONTRAST TECHNIQUE: Contiguous axial images were obtained from the base of the skull through the vertex without intravenous contrast. COMPARISON:  04/01/2018. FINDINGS: Brain: There is no evidence for acute hemorrhage, hydrocephalus, mass lesion, or abnormal extra-axial fluid collection. No definite CT evidence for acute infarction. Old right cerebellar infarct noted. Patchy low attenuation in the deep hemispheric and periventricular white matter is nonspecific, but likely reflects chronic microvascular ischemic demyelination. Vascular: No hyperdense vessel or unexpected calcification. Skull: No evidence for fracture. No worrisome lytic or sclerotic lesion. Sinuses/Orbits: The visualized paranasal sinuses and mastoid air cells are clear. Visualized portions of the globes and intraorbital fat are unremarkable. Other: None. IMPRESSION: 1. No acute intracranial abnormality. 2. Old right cerebellar infarct. 3. Chronic microvascular white matter disease Electronically Signed   By: Misty Stanley M.D.   On: 06/22/2018 16:25   Mr Brain Wo Contrast (neuro Protocol)  Result Date: 06/23/2018 CLINICAL DATA:  Focal neuro deficit. Right-sided weakness with slurred speech and facial droop EXAM: MRI HEAD WITHOUT CONTRAST TECHNIQUE: Multiplanar, multiecho pulse sequences of the brain and surrounding structures were obtained without intravenous contrast. COMPARISON:  04/01/2018 brain MRI.  Head CT from yesterday FINDINGS: Brain: 1 cm acute infarct in the central left corona radiata. Increased diffusion signal in the right cerebral white matter is facilitated by ADC map and related to remote lacunar infarct. Chronic small vessel ischemia causes confluent gliosis in the deep  cerebral white matter. Remote small vessel infarct in the right cerebellum. Age normal brain volume. No blood products,  hydrocephalus, or masslike finding. Vascular: Major flow voids are preserved Skull and upper cervical spine: No evidence of marrow lesion Sinuses/Orbits: Bilateral cataract resection IMPRESSION: 1. 1 cm acute infarct in the left corona radiata. 2. Chronic small vessel ischemia in the cerebral white matter. Electronically Signed   By: Monte Fantasia M.D.   On: 06/23/2018 01:58    Assessment/Plan: Diagnosis: acute infarction left corona radiata.   Labs and images (see above) independently reviewed.  Records reviewed and summated above. Stroke: Continue secondary stroke prophylaxis and Risk Factor Modification listed below:   Antiplatelet therapy:   Blood Pressure Management:  Continue current medication with prn's with permisive HTN per primary team Statin Agent:   Diabetes management:   Right sided hemiparesis Motor recovery: Fluoxetine  1. Does the need for close, 24 hr/day medical supervision in concert with the patient's rehab needs make it unreasonable for this patient to be served in a less intensive setting? Yes 2. Co-Morbidities requiring supervision/potential complications: DM (Monitor in accordance with exercise and adjust meds as necessary), HTN (monitor and provide prns in accordance with increased physical exertion and pain), CVA 04/01/2018 with no residual weakness, PAF (monitor HR with increased activity, cont meds), diastolic dysfunction (monitor for signs/symptoms of fluid overload) 3. Due to safety, disease management and patient education, does the patient require 24 hr/day rehab nursing? Yes 4. Does the patient require coordinated care of a physician, rehab nurse, PT (1-2 hrs/day, 5 days/week) and OT (1-2 hrs/day, 5 days/week) to address physical and functional deficits in the context of the above medical diagnosis(es)? Yes Addressing deficits in the following areas: balance, endurance, locomotion, strength, transferring, bathing, dressing, toileting and psychosocial  support 5. Can the patient actively participate in an intensive therapy program of at least 3 hrs of therapy per day at least 5 days per week? Yes 6. The potential for patient to make measurable gains while on inpatient rehab is excellent 7. Anticipated functional outcomes upon discharge from inpatient rehab are supervision and min assist  with PT, supervision and min assist with OT, supervision and min assist with SLP. 8. Estimated rehab length of stay to reach the above functional goals is: 14-18 days. 9. Anticipated D/C setting: Home 10. Anticipated post D/C treatments: HH therapy and Home excercise program 11. Overall Rehab/Functional Prognosis: good  RECOMMENDATIONS: This patient's condition is appropriate for continued rehabilitative care in the following setting: CIR Patient has agreed to participate in recommended program. Yes Note that insurance prior authorization may be required for reimbursement for recommended care.  Comment: Rehab Admissions Coordinator to follow up.   I have personally performed a face to face diagnostic evaluation, including, but not limited to relevant history and physical exam findings, of this patient and developed relevant assessment and plan.  Additionally, I have reviewed and concur with the physician assistant's documentation above.   Delice Lesch, MD, ABPMR Lavon Paganini Angiulli, PA-C 06/23/2018

## 2018-06-23 NOTE — [place] FL2 (Signed)
[place] MEDICAID FL2 LEVEL OF CARE SCREENING TOOL     IDENTIFICATION  Patient Name: Brian [name] Birthdate: 01/26/1936 Sex: male Admission Date (Current Location): 06/22/2018  [facility] and [place] Number:  Herbalist and Address:  [facility], [place] [address], [place], [place] [address]      Provider Number: [id number]  Attending Physician Name and Address:  Brian Deutscher, MD  Relative Name and Phone Number:       Current Level of Care: Hospital Recommended Level of Care: [place] Prior Approval Number:    Date Approved/Denied:   PASRR Number: [id number]  Discharge Plan: SNF    Current Diagnoses: Patient Active Problem List   Diagnosis Date Noted  . Acute ischemic left ACA stroke ([place]) 06/23/2018  . Diabetes mellitus type 2 in nonobese (HCC)   . History of CVA (cerebrovascular accident)   . PAF (paroxysmal atrial fibrillation) ([place])   . Diastolic dysfunction   . Acute CVA (cerebrovascular accident) ([place]) 04/09/2018  . Cerebral embolism with cerebral infarction 04/02/2018  . Right leg pain 12/04/2017  . Grief 12/04/2017  . Uncontrolled type 2 diabetes mellitus with circulatory disorder causing erectile dysfunction ([place]) 10/02/2016  . Allergic conjunctivitis 01/12/2016  . Abdominal pain, other specified site 06/23/2012  . Erectile dysfunction 12/13/2011  . Ejection fraction   . Fatigue 06/14/2011  . Encounter for well adult exam with abnormal findings 06/10/2011  . Nephrolithiasis 12/07/2010  . Hypertension 12/07/2010  . SKIN LESION 03/01/2008  . ALLERGIC RHINITIS 10/13/2007  . Hyperlipidemia 03/31/2007  . Overweight (BMI 25.0-29.9) 03/31/2007  . ANEMIA, CHRONIC DISEASE NEC 03/31/2007  . POLYP, ANAL AND RECTAL 03/31/2007  . BENIGN PROSTATIC HYPERTROPHY 03/31/2007  . [place]C DISEASE, LUMBAR 03/31/2007    Orientation RESPIRATION BLADDER Height & Weight     Self, Time, Situation, Place  Normal Continent  Weight: 96 kg Height:  6\' 1"  (185.4 cm)  BEHAVIORAL SYMPTOMS/MOOD NEUROLOGICAL BOWEL NUTRITION STATUS      Continent Diet(heart healthy, carb modified)  AMBULATORY STATUS COMMUNICATION OF NEEDS Skin   Extensive Assist Verbally Normal                       Personal Care Assistance Level of Assistance  Bathing, Feeding, Dressing Bathing Assistance: Limited assistance Feeding assistance: Limited assistance Dressing Assistance: Limited assistance     Functional Limitations Info  Sight, Hearing, Speech Sight Info: Adequate Hearing Info: Adequate Speech Info: Impaired    SPECIAL CARE FACTORS FREQUENCY  PT (By licensed PT), OT (By licensed OT), Speech therapy     PT Frequency: 5x/wk OT Frequency: 5x/wk     Speech Therapy Frequency: 5x/wk      Contractures Contractures Info: Not present    Additional Factors Info  Code Status, Allergies, Psychotropic, Insulin Sliding Scale Code Status Info: full Allergies Info: NKA Psychotropic Info: Neurontin 100 mg 3 times daily Insulin Sliding Scale Info: Novolog SS: 0-9 units SQ with meals, Novolog SS: 0-5 units SQ at bedtime       Current Medications (06/23/2018):  This is the current hospital active medication list Current Facility-Administered Medications  Medication Dose Route Frequency Provider Last Rate Last Dose  . 0.9 %  sodium chloride infusion   Intravenous Continuous Modena Jansky, MD 10 mL/hr at 06/22/18 2227    . acetaminophen (TYLENOL) tablet 650 mg  650 mg Oral Q4H PRN Modena Jansky, MD       Or  . acetaminophen (  TYLENOL) solution 650 mg  650 mg Per Tube Q4H PRN Hongalgi, Lenis Dickinson, MD       Or  . acetaminophen (TYLENOL) suppository 650 mg  650 mg Rectal Q4H PRN Hongalgi, Lenis Dickinson, MD      . apixaban (ELIQUIS) tablet 5 mg  5 mg Oral BID WC Rosalin Hawking, MD   5 mg at 06/23/18 1638  . [START ON 06/24/2018] aspirin EC tablet 81 mg  81 mg Oral Daily Rosalin Hawking, MD      . atorvastatin (LIPITOR) tablet 80 mg  80 mg  Oral q1800 Modena Jansky, MD   80 mg at 06/23/18 1638  . gabapentin (NEURONTIN) capsule 100 mg  100 mg Oral TID Modena Jansky, MD   100 mg at 06/23/18 1638  . insulin aspart (novoLOG) injection 0-5 Units  0-5 Units Subcutaneous QHS Hongalgi, Anand D, MD      . insulin aspart (novoLOG) injection 0-9 Units  0-9 Units Subcutaneous TID WC Modena Jansky, MD   3 Units at 06/23/18 1318  . senna-docusate (Senokot-S) tablet 1 tablet  1 tablet Oral QHS PRN Modena Jansky, MD         Discharge Medications: Please see discharge summary for a list of discharge medications.  Relevant Imaging Results:  Relevant Lab Results:   Additional Information SS#: [id number]  Pollie Friar, RN

## 2018-06-23 NOTE — Care Management Note (Signed)
Case Management Note  Patient Details  Name: Brian [name] MRN: [id number] Date of Birth: 03-20-1936  Subjective/Objective:   Pt admitted with CVA. He is from home alone. He does have brothers and sister that check on him. Patient has a cane.                 Action/Plan: Recommendations are for CIR. Pt doesn't want to pay the co pay for CIR. He and son are agreeable to SNF for rehab. They asked to be faxed out in the [facility] area. FL2 completed. CSW updated. CM following.  Expected Discharge Date:  06/23/18               Expected Discharge Plan:  Skilled Nursing Facility  In-House Referral:  Clinical Social Work  Discharge planning Services  CM Consult  Post Acute Care Choice:    Choice offered to:     DME Arranged:    DME Agency:     HH Arranged:    [place] Agency:     Status of Service:  In process, will continue to follow  If discussed at Long Length of Stay Meetings, dates discussed:    Additional Comments:  Pollie Friar, RN 06/23/2018, 5:14 PM

## 2018-06-23 NOTE — Progress Notes (Signed)
Inpatient Rehabilitation  Met with patient and son, [name] at bedside to discuss team's recommendation for IP Rehab.  Shared booklets and answered questions.  Patient reports that he can return home with family to assist.  Began discussion about anticipated level of needed assist.  Son requested that I leave information and the family will have to discuss it.  Notified nurse case manager, as they have questions about going home with home health.  Plan to follo up again tomorrow.  Call if questions.    Carmelia Roller., CCC/SLP Admission Coordinator  [place]  Cell [phone]

## 2018-06-23 NOTE — Evaluation (Signed)
Physical Therapy Evaluation Patient Details Name: Brian [name] MRN: [id number] DOB: 1936-03-23 Today's Date: 06/23/2018   History of Present Illness   Brian [name] is a pleasant 82 year old male, recently widowed 11/14/2017, lives alone, ambulates with the help of a cane, right handed, PMH of DM 2, HTN, HLD, CVA 04/01/2018 with no residual weakness, recently diagnosed PAF by 30-day event monitor placed on outpatient and started on Eliquis which he reportedly started 06/19/2018, presented to [facility] ED on 06/22/2018 due to right-sided weakness, slurred speech and facial droop that began the day prior to admission. MRI 10/7 reveals acute infarct in the left corona radiata.  Clinical Impression   Pt admitted with above diagnosis. Pt currently with functional limitations due to the deficits listed below (see PT Problem List). Managing independently at home with cane prn, brother driving him for community access (hasn't driven since CVA in July); Presents with gait and balance dysfunction, R sided weakness, decr awareness of safety; He is very motivated to regain his independence; He has available family assist from brother (will need to elucidate how much assist form brother and friends Mr. Klinker will have; I recommend CIR stay for intensive post-acute rehabilitation to maximize independence and safety with mobility; Will place CIR Screen;  Pt will benefit from skilled PT to increase their independence and safety with mobility to allow discharge to the venue listed below.       Follow Up Recommendations CIR    Equipment Recommendations  Rolling walker with 5" wheels;3in1 (PT)    Recommendations for Other Services Rehab consult     Precautions / Restrictions Precautions Precautions: Fall Restrictions Weight Bearing Restrictions: No      Mobility  Bed Mobility Overal bed mobility: Needs Assistance Bed Mobility: Supine to Sit     Supine to sit: Min guard     General bed mobility comments:  Increased time, with noted difficulty getting R foot untangled from bedclothes  Transfers Overall transfer level: Needs assistance Equipment used: 1 person hand held assist(second person standing by for safety) Transfers: Sit to/from Stand Sit to Stand: Mod assist         General transfer comment: Heavy mod assist for balance and to power up; wide base of support with significant R lean; Naturally tending to reach out for UE support  Ambulation/Gait Ambulation/Gait assistance: +2 physical assistance;Mod assist Gait Distance (Feet): (in room and hallway amb) Assistive device: 2 person hand held assist Gait Pattern/deviations: Decreased step length - right;Decreased dorsiflexion - right;Decreased stride length;Narrow base of support;Shuffle     General Gait Details: Initially with significant R lean; Tending to reach out for bilateral UE support; Decr R step length, decr dorsiflexion R with occasional foot clearance difficulty; Noted difficulty with dual-tasking, with notably decr R step length while look to read signs in the hallway  Stairs            Wheelchair Mobility    Modified Rankin (Stroke Patients Only) Modified Rankin (Stroke Patients Only) Pre-Morbid Rankin Score: No symptoms Modified Rankin: Moderately severe disability     Balance Overall balance assessment: Needs assistance Sitting-balance support: No upper extremity supported;Feet supported Sitting balance-Leahy Scale: Good     Standing balance support: During functional activity Standing balance-Leahy Scale: Poor                               Pertinent Vitals/Pain Pain Assessment: No/denies pain    Home Living Family/patient expects  to be discharged to:: Private residence Living Arrangements: Alone Available Help at Discharge: Family;Available PRN/intermittently(Brother provides intermittent assist) Type of Home: House Home Access: Stairs to enter Entrance Stairs-Rails:  Right Entrance Stairs-Number of Steps: 2-3 Home Layout: Two level(tri-level) Home Equipment: Cane - single point(Cane with small standing base)      Prior Function Level of Independence: Independent;Independent with assistive device(s)         Comments: Reports uses his cane consistently; has had a cane since he has had knee pain; at one point, he briefly indicated he has a RW (would need to be verified     Hand Dominance   Dominant Hand: Right    Extremity/Trunk Assessment   Upper Extremity Assessment Upper Extremity Assessment: Defer to OT evaluation    Lower Extremity Assessment Lower Extremity Assessment: RLE deficits/detail RLE Deficits / Details: hip flexion 3/5, quad 3+/5, ankle dorsiflexion 3+/5 (tested seated)       Communication   Communication: No difficulties  Cognition Arousal/Alertness: Awake/alert Behavior During Therapy: WFL for tasks assessed/performed Overall Cognitive Status: Impaired/Different from baseline Area of Impairment: Safety/judgement;Awareness;Attention                   Current Attention Level: Sustained     Safety/Judgement: Decreased awareness of safety;Decreased awareness of deficits Awareness: Emergent   General Comments: Difficulty with dual-tasking; He shows awareness of how different and how much difficulty he has with walking, but attributes this to not getting OOB overnight since he has been in the hospital; He is very disappointed that he cannot get home today -- noting difficulty making the connection that if he requires 2-person assist to walk in the room, he cannot go home safely by himself      General Comments      Exercises     Assessment/Plan    PT Assessment Patient needs continued PT services  PT Problem List Decreased strength;Decreased activity tolerance;Decreased balance;Decreased mobility;Decreased coordination;Decreased cognition;Decreased knowledge of use of DME;Decreased safety awareness;Decreased  knowledge of precautions       PT Treatment Interventions DME instruction;Gait training;Stair training;Functional mobility training;Therapeutic activities;Therapeutic exercise;Balance training;Neuromuscular re-education;Cognitive remediation;Patient/family education    PT Goals (Current goals can be found in the Care Plan section)  Acute Rehab PT Goals Patient Stated Goal: Wants to be able to manage better PT Goal Formulation: With patient Time For Goal Achievement: 07/07/18 Potential to Achieve Goals: Good    Frequency Min 4X/week   Barriers to discharge        Co-evaluation               AM-PAC PT "6 Clicks" Daily Activity  Outcome Measure Difficulty turning over in bed (including adjusting bedclothes, sheets and blankets)?: A Little Difficulty moving from lying on back to sitting on the side of the bed? : A Little Difficulty sitting down on and standing up from a chair with arms (e.g., wheelchair, bedside commode, etc,.)?: A Lot Help needed moving to and from a bed to chair (including a wheelchair)?: A Lot Help needed walking in hospital room?: A Lot Help needed climbing 3-5 steps with a railing? : A Lot 6 Click Score: 14    End of Session Equipment Utilized During Treatment: Gait belt Activity Tolerance: Patient tolerated treatment well Patient left: in chair;with call bell/phone within reach;with chair alarm set Nurse Communication: Mobility status PT Visit Diagnosis: Unsteadiness on feet (R26.81);Other abnormalities of gait and mobility (R26.89);Hemiplegia and hemiparesis Hemiplegia - Right/Left: Right Hemiplegia - dominant/non-dominant: Dominant Hemiplegia - caused  by: Cerebral infarction    Time: 0940-1012(Start and Stop times are approximate) PT Time Calculation (min) (ACUTE ONLY): 32 min   Charges:   PT Evaluation $PT Eval Moderate Complexity: [place], [place]  Acute Rehabilitation Services Pager [phone] Office  [place] 06/23/2018, 10:47 AM

## 2018-06-23 NOTE — Progress Notes (Signed)
STROKE TEAM PROGRESS NOTE   SUBJECTIVE (INTERVAL HISTORY) No family is at the bedside.  Overall he feels his condition is stable.  He still has right facial droop and mild right-sided weakness.  He stated that he was compliant with Eliquis at home twice a day.  He had an initial funny feeling with Eliquis but now resolved.   OBJECTIVE Temp:  [97.6 F (36.4 C)-98.6 F (37 C)] 97.9 F (36.6 C) (10/07 0642) Pulse Rate:  [57-83] 57 (10/07 0642) Cardiac Rhythm: Normal sinus rhythm (10/07 0800) Resp:  [14-18] 16 (10/07 0642) BP: (146-167)/(74-98) 146/80 (10/07 0642) SpO2:  [96 %-99 %] 99 % (10/07 0642) Weight:  [96 kg] 96 kg (10/06 1552)  Recent Labs  Lab 06/22/18 1545 06/22/18 1946 06/23/18 0735  GLUCAP 155* 109* 149*   Recent Labs  Lab 06/22/18 1606  NA 138  K 4.2  CL 103  CO2 25  GLUCOSE 170*  BUN 18  CREATININE 0.99  CALCIUM 10.0   Recent Labs  Lab 06/22/18 1606  AST 32  ALT 21  ALKPHOS 60  BILITOT 1.0  PROT 7.3  ALBUMIN 4.4   Recent Labs  Lab 06/22/18 1606  WBC 4.7  NEUTROABS 2.8  HGB 13.0  HCT 45.1  MCV 91.1  PLT 187   No results for input(s): CKTOTAL, CKMB, CKMBINDEX, TROPONINI in the last 168 hours. Recent Labs    06/22/18 1606  LABPROT 15.1  INR 1.19   No results for input(s): COLORURINE, LABSPEC, PHURINE, GLUCOSEU, HGBUR, BILIRUBINUR, KETONESUR, PROTEINUR, UROBILINOGEN, NITRITE, LEUKOCYTESUR in the last 72 hours.  Invalid input(s): APPERANCEUR     Component Value Date/Time   CHOL 95 06/23/2018 0700   TRIG 62 06/23/2018 0700   HDL 41 06/23/2018 0700   CHOLHDL 2.3 06/23/2018 0700   VLDL 12 06/23/2018 0700   LDLCALC 42 06/23/2018 0700   Lab Results  Component Value Date   HGBA1C 6.4 (H) 04/02/2018      Component Value Date/Time   LABOPIA NONE DETECTED 04/01/2018 1148   COCAINSCRNUR NONE DETECTED 04/01/2018 1148   LABBENZ NONE DETECTED 04/01/2018 1148   AMPHETMU NONE DETECTED 04/01/2018 1148   THCU NONE DETECTED 04/01/2018 1148   LABBARB (A) 04/01/2018 1148    Result not available. Reagent lot number recalled by manufacturer.    No results for input(s): ETH in the last 168 hours.  I have personally reviewed the radiological images below and agree with the radiology interpretations.  Ct Head Wo Contrast  Result Date: 06/22/2018 CLINICAL DATA:  Right-sided weakness with right facial droop. EXAM: CT HEAD WITHOUT CONTRAST TECHNIQUE: Contiguous axial images were obtained from the base of the skull through the vertex without intravenous contrast. COMPARISON:  04/01/2018. FINDINGS: Brain: There is no evidence for acute hemorrhage, hydrocephalus, mass lesion, or abnormal extra-axial fluid collection. No definite CT evidence for acute infarction. Old right cerebellar infarct noted. Patchy low attenuation in the deep hemispheric and periventricular white matter is nonspecific, but likely reflects chronic microvascular ischemic demyelination. Vascular: No hyperdense vessel or unexpected calcification. Skull: No evidence for fracture. No worrisome lytic or sclerotic lesion. Sinuses/Orbits: The visualized paranasal sinuses and mastoid air cells are clear. Visualized portions of the globes and intraorbital fat are unremarkable. Other: None. IMPRESSION: 1. No acute intracranial abnormality. 2. Old right cerebellar infarct. 3. Chronic microvascular white matter disease Electronically Signed   By: Misty Stanley M.D.   On: 06/22/2018 16:25   Mr Brain Wo Contrast (neuro Protocol)  Result Date: 06/23/2018 CLINICAL DATA:  Focal neuro deficit. Right-sided weakness with slurred speech and facial droop EXAM: MRI HEAD WITHOUT CONTRAST TECHNIQUE: Multiplanar, multiecho pulse sequences of the brain and surrounding structures were obtained without intravenous contrast. COMPARISON:  04/01/2018 brain MRI.  Head CT from yesterday FINDINGS: Brain: 1 cm acute infarct in the central left corona radiata. Increased diffusion signal in the right cerebral white  matter is facilitated by ADC map and related to remote lacunar infarct. Chronic small vessel ischemia causes confluent gliosis in the deep cerebral white matter. Remote small vessel infarct in the right cerebellum. Age normal brain volume. No blood products, hydrocephalus, or masslike finding. Vascular: Major flow voids are preserved Skull and upper cervical spine: No evidence of marrow lesion Sinuses/Orbits: Bilateral cataract resection IMPRESSION: 1. 1 cm acute infarct in the left corona radiata. 2. Chronic small vessel ischemia in the cerebral white matter. Electronically Signed   By: Monte Fantasia M.D.   On: 06/23/2018 01:58    PHYSICAL EXAM  Temp:  [97.6 F (36.4 C)-98.6 F (37 C)] 97.9 F (36.6 C) (10/07 0642) Pulse Rate:  [57-83] 57 (10/07 0642) Resp:  [14-18] 16 (10/07 0642) BP: (146-167)/(74-98) 146/80 (10/07 0642) SpO2:  [96 %-99 %] 99 % (10/07 0642) Weight:  [96 kg] 96 kg (10/06 1552)  General - Well nourished, well developed, in no apparent distress.  Ophthalmologic - fundi not visualized due to noncooperation.  Cardiovascular - Regular rate and rhythm.  Mental Status -  Level of arousal and orientation to time, place, and person were intact. Language including expression, naming, repetition, comprehension was assessed and found intact.  Mild dysarthria  Cranial Nerves II - XII - II - Visual field intact OU. III, IV, VI - Extraocular movements intact. V - Facial sensation intact bilaterally. VII - right facial droop. VIII - Hearing & vestibular intact bilaterally. X - Palate elevates symmetrically. XI - Chin turning & shoulder shrug intact bilaterally. XII - Tongue protrusion to the right  Motor Strength - The patient's strength was normal in LUE and LLE, however, RUE pronator drift with decreased dexterity, RLE 4/5 proximal and 4+/5 distal.  Bulk was normal and fasciculations were absent.   Motor Tone - Muscle tone was assessed at the neck and appendages and was  normal.  Reflexes - The patient's reflexes were symmetrical in all extremities and he had no pathological reflexes.  Sensory - Light touch, temperature/pinprick were assessed and were symmetrical except right LE 75% light touch sensation compared to left.    Coordination - The patient had normal movement in the left hand with no ataxia or dysmetria, however right hand dysmetria on finger-to-nose but proportional to the weakness.  Tremor was absent.  Gait and Station - deferred.   ASSESSMENT/PLAN Brian [name] is a 82 y.o. male with history of diabetes, hypertension, hyperlipidemia, PAF, stroke in 03/2018 admitted for slurred speech and right-sided weakness. No tPA given due to on Eliquis, and outside window.    Stroke:  left CR infarct likely secondary to small vessel disease source although patient does have PAF on Eliquis  Resultant right facial droop, right side mild hemiparesis  MRI left CR infarct  CTA head and neck 03/2018 - left distal M1 70% stenosis and right VA origin atherosclerosis.    2D Echo 03/2018 EF 45 to 50%  LDL 03/2018 152  HgbA1c 03/2018 5.4  SCDs for VTE prophylaxis  Eliquis (apixaban) daily prior to admission, now on aspirin 81 mg daily and Eliquis (apixaban) daily.  Continue baby  aspirin and Eliquis on discharge.  Patient counseled to be compliant with his antithrombotic medications  Ongoing aggressive stroke risk factor management  Therapy recommendations: CIR  Disposition: Pending  PAF  Found after last stroke on 30 day cardiac event monitoring  Follow-up with cardiology  On Eliquis 5 mg twice a day  Continue Eliquis for stroke prevention  Rate controlled  History of stroke  03/2018 presented with seizure-like activity followed by speech difficulty and drooling. EEG negative for seizure. no history of seizure.  CT head and neck showed left distal M1 70% stenosis and right VA origin atherosclerosis.  MRI showed punctate right occipital  infarct and chronic right cerebellum infarct.  EF 45 to 50%.  No DVT.  A1c 5.4 and LDL 152.  Started on DAPT and recommend 30-day cardio event monitoring as outpatient.  Patient back to baseline on discharge.   05/2018 - 30-day cardio event monitoring showed one episode of PAF - put on Eliquis by cardiology.  DAPT discontinued.  Diabetes  HgbA1c 5.4 in 03/2018, goal < 7.0  Controlled  CBG monitoring  SSI  PCP follow up  Hypertension . Stable  Long term BP goal 130-150 given left M1 stenosis  Hyperlipidemia  Home meds: Lipitor 80  LDL 47 in 03/2018, goal < 70  Now continued Lipitor 80  Continue statin at discharge  Other Stroke Risk Factors  Advanced age  Other Active Problems  [facility] day # 0  Neurology will sign off. Please call with questions. Pt will follow up with stroke clinic NP at [facility] on 08/06/18 as scheduled. Thanks for the consult.   Rosalin Hawking, MD PhD Stroke Neurology 06/23/2018 11:17 AM    To contact Stroke Continuity provider, please refer to [email or web address] After hours, contact General Neurology

## 2018-06-23 NOTE — Progress Notes (Signed)
Pt returned from MRI. Pt stable, no acute distress noted. Pt assisted to restroom by nurse tech. Returned to bed, ed alarm placed on bed.

## 2018-06-23 NOTE — Progress Notes (Signed)
Gave report to [employer]

## 2018-06-23 NOTE — Plan of Care (Signed)
Pt verbalized understanding plan of care. Pt knowledgeable on disease and secondary prevention. Pt verbalized risk factors and early stroke symptoms. Pt gait remains unsteady, one assist recommended. PT consulted. Pt ambulates well with assistance but has episode where he begins to slide. Pt is placed on high fall risk for fall precautions. Pt attempts to get up with assistance but is constantly reminded safe practice measures. Bed alarm placed. Pt has no difficulty swallowing or eating. Risk of aspiration decreased. Pt progressing towards discharge.

## 2018-06-24 LAB — GLUCOSE, CAPILLARY
GLUCOSE-CAPILLARY: 203 mg/dL — AB (ref 70–99)
GLUCOSE-CAPILLARY: 155 mg/dL — AB (ref 70–99)
Glucose-Capillary: 159 mg/dL — ABNORMAL HIGH (ref 70–99)
Glucose-Capillary: 228 mg/dL — ABNORMAL HIGH (ref 70–99)

## 2018-06-24 LAB — HEMOGLOBIN A1C
HEMOGLOBIN A1C: 6.1 % — AB (ref 4.8–5.6)
Mean Plasma Glucose: 128 mg/dL

## 2018-06-24 MED ORDER — DIGOXIN 0.25 MG/ML IJ SOLN
0.2500 mg | Freq: Four times a day (QID) | INTRAMUSCULAR | Status: AC
  Administered 2018-06-24: 0.25 mg via INTRAVENOUS
  Filled 2018-06-24 (×3): qty 1

## 2018-06-24 MED ORDER — DILTIAZEM HCL 25 MG/5ML IV SOLN
10.0000 mg | Freq: Four times a day (QID) | INTRAVENOUS | Status: DC | PRN
  Filled 2018-06-24: qty 5

## 2018-06-24 NOTE — Progress Notes (Signed)
History and Physical    Brian [name] DGU:[id number] DOB: [birth year] DOA: 06/22/2018  PCP: [name], MD  Patient coming from: Home.  Being admitted from observation status to inpatient status  I have personally briefly reviewed patient's old medical records in [place]  Chief Complaint: Acute left anterior communicating artery stroke affecting corona radiata  HPI: Brian [name] is a 82 y.o. male recently widowed 11/14/2017, lives alone, ambulates with the help of a cane, right handed, PMH of DM 2, HTN, HLD, CVA 04/01/2018 with no residual weakness, recently diagnosed PAF by 30-day event monitor placed on outpatient and started on Eliquis which he reportedly started 06/19/2018, presented to [facility] ED on 06/22/2018 due to right-sided weakness, slurred speech and facial droop that began the day prior to admission.  On the day prior to admission the patient noted some unsteadiness when coming out of the eyeglass store he was unable to walk normally and had to think about lifting up his leg and putting it down and was quite unsteady.  He went home he became weak in the right upper extremity.  He did not seek any medical attention at that time.  On the day of admission he looked into the mirror and found that his face had a droop towards the right.  He is drooling saliva.  And he decided to come to the emergency department.  Emergency department CT scan was unremarkable and patient was seen By neurology who recommended stroke work-up no need to repeat vascular imaging has not had been performed in July.  Eliquis was continued.  Permissive hypertension was started.  MRI did show an acute left anterior communicating artery stroke affecting the corona radiata.  His occult therapy has seen the patient and has recommended acute inpatient rehab.  Patient is being admitted into the hospital for further evaluation and management of acute stroke.  Review of Systems: As per HPI otherwise all other systems  reviewed and  negative.   Past Medical History:  Diagnosis Date  . ALLERGIC RHINITIS 10/13/2007  . ANEMIA, CHRONIC DISEASE NEC 03/31/2007  . BENIGN PROSTATIC HYPERTROPHY 03/31/2007  . Chest pain    Stress echo, normal, December, 2012  . Chronic anemia   . DIABETES MELLITUS, TYPE II 03/31/2007  . [place] DISEASE, LUMBAR 03/31/2007  . ED (erectile dysfunction)   . Ejection fraction    EF  normal, stress echo, December,  2012  . History of prostatitis   . HYPERLIPIDEMIA 03/31/2007  . HYPERTENSION 03/31/2007  . Nephrolithiasis 12/07/2010  . Obesity   . POLYP, ANAL AND RECTAL 03/31/2007  . Right eye trauma    hx as child  . Stroke [facility])     Past Surgical History:  Procedure Laterality Date  . [place] SURGERY     1999  . PROSTATE BIOPSY     s/p  . RECTAL POLYPECTOMY     Transanal excision 10/2005  . SKIN BIOPSY     s/p right upper back 2009- benign Dr. Tonia Brooms    Social History   Social History Narrative  . Not on file     reports that he quit smoking about 24 years ago. He quit after 10.00 years of use. He has never used smokeless tobacco. He reports that he drinks about 1.0 standard drinks of alcohol per week. He reports that he does not use drugs.  No Known Allergies  Family History  Problem Relation Age of Onset  . Colon polyps Mother   .  Stroke Mother        due to ICA stenosis  . CVA Neg Hx   . Seizures Neg Hx      Prior to Admission medications   Medication Sig Start Date End Date Taking? Authorizing Provider  amLODipine (NORVASC) 5 MG tablet Take 1 tablet (5 mg total) by mouth daily. 06/05/18 06/05/19 Yes [name], MD  apixaban (ELIQUIS) 5 MG TABS tablet Take 1 tablet (5 mg total) by mouth 2 (two) times daily. Patient taking differently: Take 5 mg by mouth 2 (two) times daily with a meal. 8am and 6pm 06/12/18  Yes Sherran Needs, NP  atorvastatin (LIPITOR) 80 MG tablet Take 1 tablet (80 mg total) by mouth daily at 6 PM. 06/05/18  Yes [name], MD    fluticasone [facility]) 50 MCG/ACT nasal spray Place 2 sprays into both nostrils daily as needed. Patient taking differently: Place 2 sprays into both nostrils daily as needed for allergies or rhinitis.  04/08/18  Yes [name], MD  gabapentin (NEURONTIN) 100 MG capsule TAKE 1 CAPSULE BY MOUTH THREE TIMES A DAY Patient taking differently: Take 100 mg by mouth 3 (three) times daily. 8am, 12pm, and bedtime (10pm) 06/18/18  Yes Rosemarie Ax, MD  losartan (COZAAR) 100 MG tablet Take 1 tablet (100 mg total) by mouth daily. 06/05/18  Yes [name], MD  metFORMIN (GLUCOPHAGE-XR) 500 MG 24 hr tablet Take 2 tablets (1,000 mg total) by mouth daily with supper. 04/08/18  Yes [name], MD  OVER THE COUNTER MEDICATION Place 1 drop into both eyes daily as needed (dry eyes). Over the counter lubricating eye drop   Yes [provider]  sitaGLIPtin (JANUVIA) 100 MG tablet Take 1 tablet (100 mg total) by mouth daily. Patient taking differently: Take 100 mg by mouth daily with lunch.  04/08/18  Yes [name], MD  ACCU-CHEK SOFTCLIX LANCETS lancets Use as instructed twice per day Dx Code E11.9 12/19/17   Philemon Kingdom, MD  glucose blood (ACCU-CHEK AVIVA) test strip Use as instructed twice per day Dx E11.9 12/19/17   Philemon Kingdom, MD    Physical Exam:  Constitutional: NAD, calm, comfortable Vitals:   06/23/18 1945 06/23/18 2330 06/24/18 0332 06/24/18 0812  BP: (!) 164/84 137/65 133/69 (!) 157/74  Pulse: 71 (!) 59 61 66  Resp: 18 20 20 20   Temp: 98.3 F (36.8 C) 98.5 F (36.9 C) 98.2 F (36.8 C) 97.7 F (36.5 C)  TempSrc: Oral Oral Oral Oral  SpO2: 98% 99% 99% 96%  Weight:      Height:        Awake Alert, Oriented X 3, No new F.N deficits, has mild right-sided facial droop and right arm weakness, normal affect [place].AT,PERRAL Supple Neck,No JVD, No cervical lymphadenopathy appriciated.  Symmetrical Chest wall movement, Good air movement bilaterally, CTAB RRR,No Gallops, Rubs  or new Murmurs, No Parasternal Heave +ve B.Sounds, Abd Soft, No tenderness, No organomegaly appriciated, No rebound - guarding or rigidity. No Cyanosis, Clubbing or edema, No new Rash or bruise   Labs on Admission: I have personally reviewed following labs and imaging studies  CBC: Recent Labs  Lab 06/22/18 1606  WBC 4.7  NEUTROABS 2.8  HGB 13.0  HCT 45.1  MCV 91.1  PLT 559   Basic Metabolic Panel: Recent Labs  Lab 06/22/18 1606  NA 138  K 4.2  CL 103  CO2 25  GLUCOSE 170*  BUN 18  CREATININE 0.99  CALCIUM 10.0   GFR: Estimated Creatinine Clearance: 72.6 mL/min (by C-G formula based on SCr of 0.99 mg/dL). Liver Function Tests: Recent Labs  Lab 06/22/18 1606  AST 32  ALT 21  ALKPHOS 60  BILITOT 1.0  PROT 7.3  ALBUMIN 4.4   No results for input(s): LIPASE, AMYLASE in the last 168 hours. No results for input(s): AMMONIA in the last 168 hours. Coagulation Profile: Recent Labs  Lab 06/22/18 1606  INR 1.19   Cardiac Enzymes: No results for input(s): CKTOTAL, CKMB, CKMBINDEX, TROPONINI in the last 168 hours. BNP (last 3 results) No results for input(s): PROBNP in the last 8760 hours. HbA1C: Recent Labs    06/23/18 0700  HGBA1C 6.1*   CBG: Recent Labs  Lab 06/23/18 1311 06/23/18 1606 06/23/18 2128 06/24/18 0609 06/24/18 1116  GLUCAP 215* 117* 182* 159* 228*   Lipid Profile: Recent Labs    06/23/18 0700  CHOL 95  HDL 41  LDLCALC 42  TRIG 62  CHOLHDL 2.3   Thyroid Function Tests: No results for input(s): TSH, T4TOTAL, FREET4, T3FREE, THYROIDAB in the last 72 hours. Anemia Panel: No results for input(s): VITAMINB12, FOLATE, FERRITIN, TIBC, IRON, RETICCTPCT in the last 72 hours. Urine analysis:    Component Value Date/Time   COLORURINE YELLOW 04/01/2018 1148   APPEARANCEUR CLEAR 04/01/2018 1148   LABSPEC 1.029 04/01/2018 1148   PHURINE 5.0 04/01/2018 1148   GLUCOSEU 50 (A) 04/01/2018 1148   GLUCOSEU 500 (A) 07/18/2017 1122   HGBUR  NEGATIVE 04/01/2018 1148   BILIRUBINUR NEGATIVE 04/01/2018 1148   [place] 04/01/2018 1148   PROTEINUR NEGATIVE 04/01/2018 1148   UROBILINOGEN 0.2 07/18/2017 1122   NITRITE NEGATIVE 04/01/2018 1148   LEUKOCYTESUR NEGATIVE 04/01/2018 1148    Radiological Exams on Admission: Ct Head Wo Contrast  Result Date: 06/22/2018 CLINICAL DATA:  Right-sided weakness with right facial droop. EXAM: CT HEAD WITHOUT CONTRAST TECHNIQUE: Contiguous axial images were obtained from the base of the skull through the vertex without intravenous contrast. COMPARISON:  04/01/2018. FINDINGS: Brain: There is no evidence for acute hemorrhage, hydrocephalus, mass lesion, or abnormal extra-axial fluid collection. No definite CT evidence for acute infarction. Old right cerebellar infarct noted. Patchy low attenuation in the deep hemispheric and periventricular white matter is nonspecific, but likely reflects chronic microvascular ischemic demyelination. Vascular: No hyperdense vessel or unexpected calcification. Skull: No evidence for fracture. No worrisome lytic or sclerotic lesion. Sinuses/Orbits: The visualized paranasal sinuses and mastoid air cells are clear. Visualized portions of the globes and intraorbital fat are unremarkable. Other: None. IMPRESSION: 1. No acute intracranial abnormality. 2. Old right cerebellar infarct. 3. Chronic microvascular white matter disease Electronically Signed   By: Misty Stanley M.D.   On: 06/22/2018 16:25   Mr Brain Wo Contrast (neuro Protocol)  Result Date: 06/23/2018 CLINICAL DATA:  Focal neuro deficit. Right-sided weakness with slurred speech and facial droop EXAM: MRI HEAD WITHOUT CONTRAST TECHNIQUE: Multiplanar, multiecho pulse sequences of the brain and surrounding structures were obtained without intravenous contrast. COMPARISON:  04/01/2018 brain MRI.  Head CT from yesterday FINDINGS: Brain: 1 cm acute infarct in the central left corona radiata. Increased diffusion signal in  the right cerebral white matter is facilitated by ADC map and related to remote lacunar infarct. Chronic small vessel ischemia causes confluent gliosis in the deep cerebral white matter. Remote small vessel infarct in the right cerebellum. Age normal brain volume. No blood products, hydrocephalus, or masslike finding. Vascular: Major flow voids are preserved Skull and upper cervical  spine: No evidence of marrow lesion Sinuses/Orbits: Bilateral cataract resection IMPRESSION: 1. 1 cm acute infarct in the left corona radiata. 2. Chronic small vessel ischemia in the cerebral white matter. Electronically Signed   By: Monte Fantasia M.D.   On: 06/23/2018 01:58    Assessment/Plan  1. Acute ischemic left ACA stroke: With residual right hemiparesis, dysarthria and facial droop.  Etiology: Likely embolic due to PAF.  LDL was 42 and A1c was less than 6, case discussed with neurologist Dr. Erlinda Hong, plan is to continue the patient on Eliquis along with aspirin, continue home dose statin with good LDL control, recently had 2D echo and carotid Dopplers few months ago hence they were not repeated, full stroke work-up, qualifies for SNF as he lives alone, bed placement requested.     2. Essential hypertension: Allow permissive hypertension due to acute stroke.    Continue to hold amlodipine and losartan. 3. Type II DM with peripheral neuropathy: Hold oral hypoglycemics.  SSI.  Repeat A1c is under 6.5 suggesting good control. 4. Hyperlipidemia: LDL is at goal under 70.  Continue atorvastatin 80 mg daily. 5. PAF: [place] vas 2 score will be at least 5.  Continue Eliquis, intermittent bursts of RVR and 2 doses of IV digoxin will be given on 06/24/2018, we can start low-dose Lopressor on 06/25/2018 if blood pressure is high enough to allow for it.  Avoiding drops in blood pressure due to CVA.     DVT prophylaxis:  Eliquis Code Status: Full Family Communication: None at bedside Disposition Plan: SNF. Consults called:  Neurology Admission status: Telemetry,  will admit to inpatient status from observation    Signature  Lala Lund M.D on 06/24/2018 at 11:28 AM  To page go to [email or web address] - password [facility]

## 2018-06-24 NOTE — Progress Notes (Addendum)
Physical Therapy Treatment Patient Details Name: Brian [name] MRN: [id number] DOB: May 27, 1936 Today's Date: 06/24/2018    History of Present Illness  Brian [name] is a pleasant 82 year old male, recently widowed 11/14/2017, lives alone, ambulates with the help of a cane, right handed, PMH of DM 2, HTN, HLD, CVA 04/01/2018 with no residual weakness, recently diagnosed PAF by 30-day event monitor placed on outpatient and started on Eliquis which he reportedly started 06/19/2018, presented to [facility] ED on 06/22/2018 due to right-sided weakness, slurred speech and facial droop that began the day prior to admission. MRI 10/7 reveals acute infarct in the left corona radiata.    PT Comments    Pt up in chair on arrival and progressing well.  He performed two bouts of gait training with RW on the 1st trial and single point cane on the 2nd trial.  Pt is performing gait quickly but as he starts to fatigue more deficits on the R side show.  Pt continues to benefit from rehab in a post acute setting to improve strength and function before returning home.  Based on CIR denial will recommend rehab at [facility] for short term recovery.  Will inform supervising PT of change in recommendations.        Follow Up Recommendations  CIR     Equipment Recommendations  Rolling walker with 5" wheels;3in1 (PT)    Recommendations for Other Services Rehab consult     Precautions / Restrictions Precautions Precautions: Fall Restrictions Weight Bearing Restrictions: No    Mobility  Bed Mobility               General bed mobility comments: Pt seated in recliner on arrival.    Transfers Overall transfer level: Needs assistance Equipment used: Rolling walker (2 wheeled);Straight cane Transfers: Sit to/from Stand Sit to Stand: Min guard         General transfer comment: Cues for hand placement to and from seated surface.  Strength is much improved with minor lean using cane and no lean using the RW.     Ambulation/Gait Ambulation/Gait assistance: Min assist;Min guard Gait Distance (Feet): 100 Feet(x2) Assistive device: Rolling walker (2 wheeled);Straight cane Gait Pattern/deviations: Decreased step length - right;Trunk flexed;Narrow base of support;Step-through pattern;Shuffle     General Gait Details: Pt with improved gait with use of RW, patient requires min guard with RW and required cues for R foot clearance and stride length.  Pt performed second trial with his cane (uses at baseline) he required min assistance with increased weakness noted on R side and minor listing to R.  Minor  LOB x2 with min assistance to correct.     Stairs             Wheelchair Mobility    Modified Rankin (Stroke Patients Only)       Balance Overall balance assessment: Needs assistance   Sitting balance-Leahy Scale: Good       Standing balance-Leahy Scale: Poor                              Cognition Arousal/Alertness: Awake/alert Behavior During Therapy: WFL for tasks assessed/performed Overall Cognitive Status: Impaired/Different from baseline Area of Impairment: Safety/judgement;Problem solving                         Safety/Judgement: Decreased awareness of safety;Decreased awareness of deficits   Problem Solving: Requires verbal cues;Requires tactile cues General  Comments: Pt confident he can ambulate with his cane, he appears unaware of deficits but when pointed out he reports he understands.        Exercises      General Comments        Pertinent Vitals/Pain Pain Assessment: No/denies pain    Home Living                      Prior Function            PT Goals (current goals can now be found in the care plan section) Acute Rehab PT Goals Patient Stated Goal: Wants to be able to manage better Potential to Achieve Goals: Good Progress towards PT goals: Progressing toward goals    Frequency    Min 4X/week      PT Plan  Current plan remains appropriate    Co-evaluation              AM-PAC PT "6 Clicks" Daily Activity  Outcome Measure  Difficulty turning over in bed (including adjusting bedclothes, sheets and blankets)?: A Little Difficulty moving from lying on back to sitting on the side of the bed? : A Little Difficulty sitting down on and standing up from a chair with arms (e.g., wheelchair, bedside commode, etc,.)?: A Lot Help needed moving to and from a bed to chair (including a wheelchair)?: A Lot Help needed walking in hospital room?: A Lot Help needed climbing 3-5 steps with a railing? : A Lot 6 Click Score: 14    End of Session Equipment Utilized During Treatment: Gait belt Activity Tolerance: Patient tolerated treatment well Patient left: in chair;with call bell/phone within reach Nurse Communication: Mobility status PT Visit Diagnosis: Unsteadiness on feet (R26.81);Other abnormalities of gait and mobility (R26.89);Hemiplegia and hemiparesis Hemiplegia - Right/Left: Right Hemiplegia - dominant/non-dominant: Dominant Hemiplegia - caused by: Cerebral infarction     Time: 4196-2229 PT Time Calculation (min) (ACUTE ONLY): 10 min  Charges:  $Gait Training: 8-22 mins                     Governor Rooks, PTA Acute Rehabilitation Services Pager [phone] Office [phone]     Aayushi Solorzano Eli Hose 06/24/2018, 4:02 PM

## 2018-06-24 NOTE — Evaluation (Signed)
Speech Language Pathology Evaluation Patient Details Name: MAESON PUROHIT MRN: [id number] DOB: 02-18-1936 Today's Date: 06/24/2018 Time: 1025-8527 SLP Time Calculation (min) (ACUTE ONLY): 36 min  Problem List:  Patient Active Problem List   Diagnosis Date Noted  . Acute ischemic left ACA stroke ([place]) 06/23/2018  . History of CVA (cerebrovascular accident)   . PAF (paroxysmal atrial fibrillation) ([place])   . Diastolic dysfunction   . Acute CVA (cerebrovascular accident) ([place]) 04/09/2018  . Cerebral embolism with cerebral infarction 04/02/2018  . Right leg pain 12/04/2017  . Grief 12/04/2017  . Uncontrolled type 2 diabetes mellitus with circulatory disorder causing erectile dysfunction ([place]) 10/02/2016  . Allergic conjunctivitis 01/12/2016  . Abdominal pain, other specified site 06/23/2012  . Erectile dysfunction 12/13/2011  . Ejection fraction   . Fatigue 06/14/2011  . Encounter for well adult exam with abnormal findings 06/10/2011  . Nephrolithiasis 12/07/2010  . Hypertension 12/07/2010  . SKIN LESION 03/01/2008  . ALLERGIC RHINITIS 10/13/2007  . Hyperlipidemia 03/31/2007  . Overweight (BMI 25.0-29.9) 03/31/2007  . ANEMIA, CHRONIC DISEASE NEC 03/31/2007  . POLYP, ANAL AND RECTAL 03/31/2007  . BENIGN PROSTATIC HYPERTROPHY 03/31/2007  . [place] DISEASE, LUMBAR 03/31/2007   Past Medical History:  Past Medical History:  Diagnosis Date  . ALLERGIC RHINITIS 10/13/2007  . ANEMIA, CHRONIC DISEASE NEC 03/31/2007  . BENIGN PROSTATIC HYPERTROPHY 03/31/2007  . Chest pain    Stress echo, normal, December, 2012  . Chronic anemia   . DIABETES MELLITUS, TYPE II 03/31/2007  . [place] DISEASE, LUMBAR 03/31/2007  . ED (erectile dysfunction)   . Ejection fraction    EF  normal, stress echo, December,  2012  . History of prostatitis   . HYPERLIPIDEMIA 03/31/2007  . HYPERTENSION 03/31/2007  . Nephrolithiasis 12/07/2010  . Obesity   . POLYP, ANAL AND RECTAL 03/31/2007  . Right eye trauma    hx as  child  . Stroke [facility])    Past Surgical History:  Past Surgical History:  Procedure Laterality Date  . [place] SURGERY     1999  . PROSTATE BIOPSY     s/p  . RECTAL POLYPECTOMY     Transanal excision 10/2005  . SKIN BIOPSY     s/p right upper back 2009- benign Dr. Tonia Brooms   HPI:  82 yo male adm to [facility] with right sided hemiparesis, dysarthria and right facial droop.  Pt found to have acute CVA - corona radiata.  PMH + for CVA parietal lobe summer 2019, DM, PAF, HLF, HTN.  Pt for speech evaluation as part of stroke work up.  Pt seen by this SLP previously with cognitive deficits being diagnosed.     Assessment / Plan / Recommendation Clinical Impression  MOCA 7.1 *not written portion* provided to pt with him scoring 14/25 points indicative of cognitive linguistic deficits.  Functionally pt also with expressive language deficits characterized by pauses due to word finding deficits.  Pt also with facial, vagal, hypoglossal and glossopharyngeal nerve deficits resulting in dysarthria.  Dysarthria c/b imprecise articulation and low amplitude of phonation.  MOCA strengths included repetition and attention tasks.  Deficits in awareness, judgement, abstract thought and naming items (4 in sixty seconds - 11 is [facility]) apparent.  Pt reports reading is important to him and this may help improve his functional memory.  He will benefit from follow up at SNF for dysarthria, cognitive linguistic deficits.  Using teach back (turn off background noise, overarticulate) and written tips for memory,  pt educated and agreeable  to goals and SLP intervention.       SLP Assessment  SLP Recommendation/Assessment: Patient needs continued Speech Lanaguage Pathology Services SLP Visit Diagnosis: Dysarthria and anarthria (R47.1);Attention and concentration deficit Attention and concentration deficit following: Cerebral infarction    Follow Up Recommendations  Skilled Nursing facility    Frequency and Duration min 1  x/week  1 week      SLP Evaluation Cognition  Overall Cognitive Status: Impaired/Different from baseline Arousal/Alertness: Awake/alert Orientation Level: Oriented X4(oriented to year with choice of 2 *written* stated 2098) Attention: Sustained Sustained Attention: Appears intact Memory: Impaired Memory Impairment: Storage deficit;Retrieval deficit(0/5 I, 4/5 with cues) Awareness: Impaired Awareness Impairment: Intellectual impairment(pt denies fall risk) Problem Solving: Impaired Problem Solving Impairment: Functional basic(SLP desires to place urinal close to pt for emergent use and pt with decreased desire but reports it may take 15 min for people to get to him) Safety/Judgment: Impaired       Comprehension  Auditory Comprehension Overall Auditory Comprehension: Appears within functional limits for tasks assessed Yes/No Questions: Not tested Commands: Within Functional Limits(for basic) Conversation: Complex Visual Recognition/Discrimination Discrimination: Not tested Reading Comprehension Reading Status: (choice of correct year)    Expression Expression Primary Mode of Expression: Verbal Verbal Expression Initiation: No impairment Level of Generative/Spontaneous Verbalization: Conversation Repetition: No impairment Naming: Impairment(2/3 animals named correctly, word finding deficits apparent c/b pauses) Pragmatics: No impairment(? mild flat affect or pt personality) Written Expression Dominant Hand: Right(did not test writing due to weakness)   Oral / Motor  Oral Motor/Sensory Function Overall Oral Motor/Sensory Function: Mild impairment Facial ROM: Reduced right Facial Symmetry: Abnormal symmetry right Facial Strength: Reduced right Facial Sensation: Within Functional Limits Lingual ROM: Reduced right Lingual Symmetry: Within Functional Limits Lingual Strength: Reduced Lingual Sensation: Within Functional Limits Velum: Impaired right;Suspected CN X (Vagus)  dysfunction(deviates to left) Mandible: (dnt) Motor Speech Overall Motor Speech: Impaired Respiration: Impaired Phonation: Low vocal intensity Resonance: Within functional limits Articulation: Impaired Intelligibility: Intelligibility reduced Word: 75-100% accurate Phrase: 75-100% accurate Sentence: 50-74% accurate Motor Planning: Witnin functional limits Motor Speech Errors: Not applicable Effective Techniques: Slow rate(modifying environment)   GO                    [name] 06/24/2018, 9:21 AM  Luanna Salk, MS [facility] SLP Acute Rehab Services Pager [phone] Office [phone]

## 2018-06-24 NOTE — Progress Notes (Signed)
Inpatient Rehabilitation  Note that after reviewing insurance verification letter with patient and son they have decided on SNF level of rehab.  Will sign off at this time.  Call if questions.   Carmelia Roller., CCC/SLP Admission Coordinator  [place]  Cell [phone]

## 2018-06-24 NOTE — Progress Notes (Signed)
Pt has a heart rate of > 160 sustained. Pt is a symptomatic. MD notified. RN will continue to assess pt.

## 2018-06-25 LAB — GLUCOSE, CAPILLARY
GLUCOSE-CAPILLARY: 220 mg/dL — AB (ref 70–99)
Glucose-Capillary: 162 mg/dL — ABNORMAL HIGH (ref 70–99)
Glucose-Capillary: 140 mg/dL — ABNORMAL HIGH (ref 70–99)
Glucose-Capillary: 179 mg/dL — ABNORMAL HIGH (ref 70–99)

## 2018-06-25 NOTE — Progress Notes (Addendum)
Patient Demographics:    Brian [name], is a 82 y.o. male, DOB - June 03, 1936, DPO:[id number]  Admit date - 06/22/2018   Admitting Physician Modena Jansky, MD  Outpatient Primary MD for the patient is [name], MD  LOS - 2   Chief Complaint  Patient presents with  . Stroke Symptoms        Subjective:    [name] today has no fevers, no emesis,  No chest pain, no new complaint, right-sided weakness is improving  Assessment  & Plan :    Principal Problem:   Acute ischemic left ACA stroke (HCC) Active Problems:   Hyperlipidemia   Overweight (BMI 25.0-29.9)   Hypertension   Uncontrolled type 2 diabetes mellitus with circulatory disorder causing erectile dysfunction (HCC)   History of CVA (cerebrovascular accident)   PAF (paroxysmal atrial fibrillation) (HCC)   Diastolic dysfunction  Brief Summary 82 year old with past medical history relevant for  PAF , DM2, prior CVA 04/01/18, HTN admitted on 06/22/2018 with right hemiparesis, speech disturbance and facial droop-work-up consistent with acute left anterior communicating artery CVA affecting coronary radiata .  Plan:- 1)Acute Lt ACA CVA---acute left anterior communicating artery CVA affecting coronary radiata , neurology input appreciated, LDL is 42, Even if his lipid panel is within desired limits, patient should still take Lipitor/Statin for it's Pleiotropic effects (beyond cholesterol lowering benefits), continue Lipitor 80 mg daily, continue Eliquis and aspirin as per neurologist  2)DM2-last A1c 6.5, good control overall, Use Novolog/Humalog Sliding scale insulin with Accu-Cheks/Fingersticks as ordered   3)PAF- CHA2D-Vasc score 6 (DM2, age (2), HTN,CVA (2))------ continue Eliquis as above #1, currently rate controlled without AV nodal agents, patient actually had intermittent RVR required IV digoxin at the time, most likely will discharge on  metoprolol 12.5 mg twice daily  4)HTN--allow permissive hypertension in the acute stroke  Disposition/Need for in-Hospital Stay- patient unable to be discharged at this time due to patient awaiting insurance approval for SNF rehab due to acute stroke  Code Status : full   Disposition Plan  : See above, PT OT eval appreciated  Consults  : Neurology   DVT Prophylaxis  : Apixaban  Lab Results  Component Value Date   PLT 187 06/22/2018    Inpatient Medications  Scheduled Meds: . apixaban  5 mg Oral BID WC  . aspirin EC  81 mg Oral Daily  . atorvastatin  80 mg Oral q1800  . gabapentin  100 mg Oral TID  . insulin aspart  0-5 Units Subcutaneous QHS  . insulin aspart  0-9 Units Subcutaneous TID WC   Continuous Infusions: . sodium chloride 10 mL/hr at 06/22/18 2227   PRN Meds:.acetaminophen **OR** acetaminophen (TYLENOL) oral liquid 160 mg/5 mL **OR** acetaminophen, diltiazem, senna-docusate    Anti-infectives (From admission, onward)   None        Objective:   Vitals:   06/25/18 0900 06/25/18 0942 06/25/18 1154 06/25/18 1554  BP: (!) 164/74 (!) 163/74 (!) 165/81 (!) 152/84  Pulse: 65 68 61 66  Resp: 18 18 20 20   Temp: 98.1 F (36.7 C) (!) 97.5 F (36.4 C) 97.7 F (36.5 C) 98.6 F (37 C)  TempSrc: Oral Oral Oral Oral  SpO2: 97% 99% 100% 100%  Weight:  Height:        Wt Readings from Last 3 Encounters:  06/22/18 96 kg  06/05/18 96.2 kg  06/04/18 96 kg     Intake/Output Summary (Last 24 hours) at 06/25/2018 1612 Last data filed at 06/25/2018 0900 Gross per 24 hour  Intake 240 ml  Output -  Net 240 ml     Physical Exam Patient is examined daily including today on 06/25/18 , exams remain the same as of yesterday except that has changed   Gen:- Awake Alert,  In no apparent distress  HEENT:- [place].AT, No sclera icterus Neck-Supple Neck,No JVD,.  Lungs-  CTAB , good air movement CV- S1, S2 normal, irregularly irregular Abd-  +ve B.Sounds, Abd Soft,  No tenderness,    Extremity/Skin:- No  edema,   good pulses Psych-affect is appropriate, oriented x3 Neuro-right-sided hemiparesis,, no tremors, gait concerns persist, speech is improving   Data Review:   Micro Results No results found for this or any previous visit (from the past 240 hour(s)).  Radiology Reports Ct Head Wo Contrast  Result Date: 06/22/2018 CLINICAL DATA:  Right-sided weakness with right facial droop. EXAM: CT HEAD WITHOUT CONTRAST TECHNIQUE: Contiguous axial images were obtained from the base of the skull through the vertex without intravenous contrast. COMPARISON:  04/01/2018. FINDINGS: Brain: There is no evidence for acute hemorrhage, hydrocephalus, mass lesion, or abnormal extra-axial fluid collection. No definite CT evidence for acute infarction. Old right cerebellar infarct noted. Patchy low attenuation in the deep hemispheric and periventricular white matter is nonspecific, but likely reflects chronic microvascular ischemic demyelination. Vascular: No hyperdense vessel or unexpected calcification. Skull: No evidence for fracture. No worrisome lytic or sclerotic lesion. Sinuses/Orbits: The visualized paranasal sinuses and mastoid air cells are clear. Visualized portions of the globes and intraorbital fat are unremarkable. Other: None. IMPRESSION: 1. No acute intracranial abnormality. 2. Old right cerebellar infarct. 3. Chronic microvascular white matter disease Electronically Signed   By: Misty Stanley M.D.   On: 06/22/2018 16:25   Mr Brain Wo Contrast (neuro Protocol)  Result Date: 06/23/2018 CLINICAL DATA:  Focal neuro deficit. Right-sided weakness with slurred speech and facial droop EXAM: MRI HEAD WITHOUT CONTRAST TECHNIQUE: Multiplanar, multiecho pulse sequences of the brain and surrounding structures were obtained without intravenous contrast. COMPARISON:  04/01/2018 brain MRI.  Head CT from yesterday FINDINGS: Brain: 1 cm acute infarct in the central left corona  radiata. Increased diffusion signal in the right cerebral white matter is facilitated by ADC map and related to remote lacunar infarct. Chronic small vessel ischemia causes confluent gliosis in the deep cerebral white matter. Remote small vessel infarct in the right cerebellum. Age normal brain volume. No blood products, hydrocephalus, or masslike finding. Vascular: Major flow voids are preserved Skull and upper cervical spine: No evidence of marrow lesion Sinuses/Orbits: Bilateral cataract resection IMPRESSION: 1. 1 cm acute infarct in the left corona radiata. 2. Chronic small vessel ischemia in the cerebral white matter. Electronically Signed   By: Monte Fantasia M.D.   On: 06/23/2018 01:58     CBC Recent Labs  Lab 06/22/18 1606  WBC 4.7  HGB 13.0  HCT 45.1  PLT 187  MCV 91.1  MCH 26.3  MCHC 28.8*  RDW 13.2  LYMPHSABS 1.2  MONOABS 0.5  EOSABS 0.1  BASOSABS 0.0    Chemistries  Recent Labs  Lab 06/22/18 1606  NA 138  K 4.2  CL 103  CO2 25  GLUCOSE 170*  BUN 18  CREATININE 0.99  CALCIUM  10.0  AST 32  ALT 21  ALKPHOS 60  BILITOT 1.0  ------------------------------------------------------------------------------------------------------------------ Recent Labs    06/23/18 0700  CHOL 95  HDL 41  LDLCALC 42  TRIG 62  CHOLHDL 2.3    Lab Results  Component Value Date   HGBA1C 6.1 (H) 06/23/2018   ------------------------------------------------------------------------------------------------------------------ No results for input(s): TSH, T4TOTAL, T3FREE, THYROIDAB in the last 72 hours.  Invalid input(s): FREET3 ------------------------------------------------------------------------------------------------------------------ No results for input(s): VITAMINB12, FOLATE, FERRITIN, TIBC, IRON, RETICCTPCT in the last 72 hours.  Coagulation profile Recent Labs  Lab 06/22/18 1606  INR 1.19    No results for input(s): DDIMER in the last 72 hours.  Cardiac  Enzymes No results for input(s): CKMB, TROPONINI, MYOGLOBIN in the last 168 hours.  Invalid input(s): CK ------------------------------------------------------------------------------------------------------------------ No results found for: BNP   Roxan Hockey M.D on 06/25/2018 at 4:12 PM  Pager---[phone] Go to [email or web address] - password [email or web address] for contact info  [facility] - Office  [phone]

## 2018-06-25 NOTE — Progress Notes (Signed)
Physical Therapy Treatment Patient Details Name: Brian [name] MRN: [id number] DOB: [birth year] Today's Date: 06/25/2018    History of Present Illness  Edmond TRAMAINE SAULS is a pleasant 82 year old male, recently widowed 11/14/2017, lives alone, ambulates with the help of a cane, right handed, PMH of DM 2, HTN, HLD, CVA 04/01/2018 with no residual weakness, recently diagnosed PAF by 30-day event monitor placed on outpatient and started on Eliquis which he reportedly started 06/19/2018, presented to [facility] ED on 06/22/2018 due to right-sided weakness, slurred speech and facial droop that began the day prior to admission. MRI 10/7 reveals acute infarct in the left corona radiata.    PT Comments    Pt performed gait training and functional mobility with SPC.  Pt relies on vision for balance.  Performed standing activities to challenge balance and strength.  Pt continues to benefit from skilled rehab in a post acute setting to improve strength and function before returning home.    Follow Up Recommendations  SNF     Equipment Recommendations  Rolling walker with 5" wheels;3in1 (PT)    Recommendations for Other Services Rehab consult     Precautions / Restrictions Precautions Precautions: Fall Restrictions Weight Bearing Restrictions: No    Mobility  Bed Mobility               General bed mobility comments: Pt seated in recliner on arrival.    Transfers Overall transfer level: Needs assistance Equipment used: Straight cane Transfers: Sit to/from Stand Sit to Stand: Min guard         General transfer comment: Min guard for safety due to R sided weakness.    Ambulation/Gait Ambulation/Gait assistance: Min guard Gait Distance (Feet): 100 Feet Assistive device: Straight cane Gait Pattern/deviations: Decreased step length - right;Trunk flexed;Narrow base of support;Step-through pattern;Shuffle;Drifts right/left     General Gait Details: Pt with head turns drifts Right, Cues for R  foot clearance, wearing shoes during ambulation,  Minor postural sway.     Stairs             Wheelchair Mobility    Modified Rankin (Stroke Patients Only) Modified Rankin (Stroke Patients Only) Pre-Morbid Rankin Score: No symptoms Modified Rankin: Moderately severe disability     Balance Overall balance assessment: Needs assistance   Sitting balance-Leahy Scale: Good       Standing balance-Leahy Scale: Poor                              Cognition Arousal/Alertness: Awake/alert Behavior During Therapy: WFL for tasks assessed/performed Overall Cognitive Status: Impaired/Different from baseline Area of Impairment: Safety/judgement                   Current Attention Level: Sustained     Safety/Judgement: Decreased awareness of safety;Decreased awareness of deficits Awareness: Emergent Problem Solving: Requires verbal cues;Requires tactile cues General Comments: Pt confident he can ambulate with his cane, he appears unaware of deficits but when pointed out he reports he understands.        Exercises General Exercises - Lower Extremity Hip Flexion/Marching: AROM;Both;10 reps;Standing Heel Raises: AROM;Both;10 reps;Standing    General Comments General comments (skin integrity, edema, etc.): Pt performed single limb stance with intermittent unilateral and Bilateral support.  Pt also performed sit to stand without UE support.        Pertinent Vitals/Pain Pain Assessment: No/denies pain    Home Living  Prior Function            PT Goals (current goals can now be found in the care plan section) Acute Rehab PT Goals Patient Stated Goal: Wants to be able to manage better Potential to Achieve Goals: Good Progress towards PT goals: Progressing toward goals    Frequency    Min 4X/week      PT Plan Current plan remains appropriate    Co-evaluation              AM-PAC PT "6 Clicks" Daily Activity   Outcome Measure  Difficulty turning over in bed (including adjusting bedclothes, sheets and blankets)?: A Little Difficulty moving from lying on back to sitting on the side of the bed? : A Little Difficulty sitting down on and standing up from a chair with arms (e.g., wheelchair, bedside commode, etc,.)?: A Little Help needed moving to and from a bed to chair (including a wheelchair)?: A Little Help needed walking in hospital room?: A Little Help needed climbing 3-5 steps with a railing? : A Little 6 Click Score: 18    End of Session Equipment Utilized During Treatment: Gait belt Activity Tolerance: Patient tolerated treatment well Patient left: in chair;with call bell/phone within reach Nurse Communication: Mobility status PT Visit Diagnosis: Unsteadiness on feet (R26.81);Other abnormalities of gait and mobility (R26.89);Hemiplegia and hemiparesis Hemiplegia - Right/Left: Right Hemiplegia - dominant/non-dominant: Dominant Hemiplegia - caused by: Cerebral infarction     Time: 5379-4327 PT Time Calculation (min) (ACUTE ONLY): 23 min  Charges:  $Gait Training: 8-22 mins $Therapeutic Activity: 8-22 mins                     Governor Rooks, PTA Acute Rehabilitation Services Pager [phone] Office [phone]     Saiya Crist Eli Hose 06/25/2018, 3:00 PM

## 2018-06-26 LAB — GLUCOSE, CAPILLARY
GLUCOSE-CAPILLARY: 171 mg/dL — AB (ref 70–99)
GLUCOSE-CAPILLARY: 225 mg/dL — AB (ref 70–99)
Glucose-Capillary: 165 mg/dL — ABNORMAL HIGH (ref 70–99)
Glucose-Capillary: 138 mg/dL — ABNORMAL HIGH (ref 70–99)

## 2018-06-26 MED ORDER — AMLODIPINE BESYLATE 2.5 MG PO TABS
2.5000 mg | ORAL_TABLET | Freq: Every day | ORAL | Status: DC
  Administered 2018-06-26 – 2018-06-27 (×2): 2.5 mg via ORAL
  Filled 2018-06-26 (×2): qty 1

## 2018-06-26 NOTE — Progress Notes (Addendum)
Physical Therapy Treatment Patient Details Name: Brian [name] MRN: [id number] DOB: 11-Jan-1936 Today's Date: 06/26/2018    History of Present Illness  Brian [name] is a pleasant 82 year old male, recently widowed 11/14/2017, lives alone, ambulates with the help of a cane, right handed, PMH of DM 2, HTN, HLD, CVA 04/01/2018 with no residual weakness, recently diagnosed PAF by 30-day event monitor placed on outpatient and started on Eliquis which he reportedly started 06/19/2018, presented to [facility] ED on 06/22/2018 due to right-sided weakness, slurred speech and facial droop that began the day prior to admission. MRI 10/7 reveals acute infarct in the left corona radiata.    PT Comments    Pt performed gait training and functional mobility.  Progressed to high level balance activites with increased difficulty lifting R foot and coordinating movement.  Pt remains to present with LOB x2 when poorly sequencing with his cane.  Continue to recommend SNF placement to improve balance, coordination and strength before returning home.  Pt visibly fatigued during session.      Follow Up Recommendations  SNF     Equipment Recommendations  Rolling walker with 5" wheels;3in1 (PT)    Recommendations for Other Services Rehab consult     Precautions / Restrictions Precautions Precautions: Fall Restrictions Weight Bearing Restrictions: No    Mobility  Bed Mobility               General bed mobility comments: Pt seated in recliner on arrival.    Transfers Overall transfer level: Needs assistance Equipment used: Straight cane Transfers: Sit to/from Stand Sit to Stand: Min guard         General transfer comment: Min guard for safety due to R sided weakness.    Ambulation/Gait Ambulation/Gait assistance: Min guard;Min assist(Min assistance x2 due to LOB.  ) Gait Distance (Feet): 120 Feet Assistive device: Straight cane Gait Pattern/deviations: Decreased step length - right;Trunk  flexed;Narrow base of support;Step-through pattern;Shuffle;Drifts right/left     General Gait Details: Pt with head turns drifts Right, Cues for R foot clearance, wearing shoes during ambulation,  Minor postural sway.     Stairs Stairs: Yes Stairs assistance: Min assist Stair Management: One rail Left;With cane Number of Stairs: 6 General stair comments: Performed to challenge balance, problem solving and following commands.  Pt with poor safety awareness with cane placement and intially performed in reciprocal pattern.  Pt educated on cane placement and maintaining step to sequencing.     Wheelchair Mobility    Modified Rankin (Stroke Patients Only) Modified Rankin (Stroke Patients Only) Pre-Morbid Rankin Score: No symptoms Modified Rankin: Moderately severe disability     Balance Overall balance assessment: Needs assistance   Sitting balance-Leahy Scale: Good     Standing balance support: During functional activity Standing balance-Leahy Scale: Poor Standing balance comment: Fair to POOR             High level balance activites: Side stepping;Backward walking;Direction changes High Level Balance Comments: Pt performed 8 foot x4 trials, sidestepping to R, sidestepping to L, forward walking without device and retro gait.  Pt with poor R foot clearance and sliding R foot backwards and laterally during exercises.              Cognition Arousal/Alertness: Awake/alert Behavior During Therapy: WFL for tasks assessed/performed Overall Cognitive Status: Impaired/Different from baseline Area of Impairment: Safety/judgement                   Current Attention Level: Sustained  Safety/Judgement: Decreased awareness of safety;Decreased awareness of deficits Awareness: Emergent Problem Solving: Requires verbal cues;Requires tactile cues        Exercises      General Comments        Pertinent Vitals/Pain Pain Assessment: No/denies pain    Home  Living                      Prior Function            PT Goals (current goals can now be found in the care plan section) Acute Rehab PT Goals Patient Stated Goal: Wants to be able to manage better Potential to Achieve Goals: Good Progress towards PT goals: Progressing toward goals    Frequency    Min 4X/week      PT Plan Current plan remains appropriate    Co-evaluation              AM-PAC PT "6 Clicks" Daily Activity  Outcome Measure  Difficulty turning over in bed (including adjusting bedclothes, sheets and blankets)?: A Little Difficulty moving from lying on back to sitting on the side of the bed? : A Little Difficulty sitting down on and standing up from a chair with arms (e.g., wheelchair, bedside commode, etc,.)?: A Little Help needed moving to and from a bed to chair (including a wheelchair)?: A Little Help needed walking in hospital room?: A Little Help needed climbing 3-5 steps with a railing? : A Little 6 Click Score: 18    End of Session Equipment Utilized During Treatment: Gait belt Activity Tolerance: Patient tolerated treatment well Patient left: in chair;with call bell/phone within reach Nurse Communication: Mobility status PT Visit Diagnosis: Unsteadiness on feet (R26.81);Other abnormalities of gait and mobility (R26.89);Hemiplegia and hemiparesis Hemiplegia - Right/Left: Right Hemiplegia - dominant/non-dominant: Dominant Hemiplegia - caused by: Cerebral infarction     Time: 1015-1030 PT Time Calculation (min) (ACUTE ONLY): 15 min  Charges:  $Gait Training: 8-22 mins                     Governor Rooks, PTA Acute Rehabilitation Services Pager [phone] Office [phone]     Brian [name] 06/26/2018, 12:18 PM

## 2018-06-26 NOTE — Care Management Important Message (Signed)
Important Message  Patient Details  Name: Brian [name] MRN: [id number] Date of Birth: 13-May-1936   Medicare Important Message Given:  Yes    [name] Circle 06/26/2018, 4:35 PM

## 2018-06-26 NOTE — Progress Notes (Signed)
Occupational Therapy Treatment Patient Details Name: Brian [name] MRN: [id number] DOB: 1936-01-18 Today's Date: 06/26/2018    History of present illness  Brian [name] is a pleasant 82 year old male, recently widowed 11/14/2017, lives alone, ambulates with the help of a cane, right handed, PMH of DM 2, HTN, HLD, CVA 04/01/2018 with no residual weakness, recently diagnosed PAF by 30-day event monitor placed on outpatient and started on Eliquis which he reportedly started 06/19/2018, presented to [facility] ED on 06/22/2018 due to right-sided weakness, slurred speech and facial droop that began the day prior to admission. MRI 10/7 reveals acute infarct in the left corona radiata.   OT comments  Patient progressing well.  Demonstrates improving strength and coordination of R UE, although fatigues easily.  Demonstrates ability to complete grooming with min guard standing and toileting with close supervision, toilet transfer with min guard assist.  Continue to educate on fall prevention, safety, and exercises to R UE.  Patient issued red foam handle for toothbrush and educated on use during meals using dominant R UE to maximize return of function. Dc plan updated to SNF, per pt preference. Will continue to follow.    Follow Up Recommendations  SNF;Supervision/Assistance - 24 hour    Equipment Recommendations  3 in 1 bedside commode    Recommendations for Other Services      Precautions / Restrictions Precautions Precautions: Fall Restrictions Weight Bearing Restrictions: No       Mobility Bed Mobility               General bed mobility comments: seated OOB in recliner   Transfers Overall transfer level: Needs assistance Equipment used: Straight cane Transfers: Sit to/from Stand Sit to Stand: Min guard         General transfer comment: Min guard for safety due to R sided weakness.      Balance Overall balance assessment: Needs assistance Sitting-balance support: No upper extremity  supported;Feet supported Sitting balance-Leahy Scale: Good     Standing balance support: During functional activity;No upper extremity supported Standing balance-Leahy Scale: Poor Standing balance comment: min guard for safety during static ADl tasks              High level balance activites: Side stepping;Backward walking;Direction changes High Level Balance Comments: Pt performed 8 foot x4 trials, sidestepping to R, sidestepping to L, forward walking without device and retro gait.  Pt with poor R foot clearance and sliding R foot backwards and laterally during exercises.             ADL either performed or assessed with clinical judgement   ADL Overall ADL's : Needs assistance/impaired     Grooming: Min guard;Standing;Wash/dry hands;Oral care Grooming Details (indicate cue type and reason): min guard for safety with activities at sink, cueing for increased use of R UE during oral care (using foam handle for increased grasp)                 Toilet Transfer: Min guard;Ambulation(cane) Toilet Transfer Details (indicate cue type and reason): simulated from recliner, cueing for safety awareness Toileting- Clothing Manipulation and Hygiene: Supervision/safety;Sit to/from stand Toileting - Clothing Manipulation Details (indicate cue type and reason): close supervision for toileting in restroom, standing with 1 hand support     Functional mobility during ADLs: Min guard;Cane       Vision       Perception     Praxis      Cognition Arousal/Alertness: Awake/alert Behavior During Therapy: [facility] for  tasks assessed/performed Overall Cognitive Status: Impaired/Different from baseline Area of Impairment: Safety/judgement;Memory;Awareness;Problem solving                   Current Attention Level: Sustained     Safety/Judgement: Decreased awareness of safety;Decreased awareness of deficits Awareness: Emergent Problem Solving: Requires verbal cues;Requires tactile  cues General Comments: requires cueing for safety awareness        Exercises Exercises: Other exercises Other Exercises Other Exercises: Engaged in R UE AROM exercises for proximal strengthening: shoudler flexion, sustained 90 FF with sup/pronation, wrist flexion/extension; x 10 reps.  Other Exercises: Engaged in coordination activity with R UE, stacking cups into triangle; with min drop rate due to decreased coordination and proximal fatigue.    Shoulder Instructions       General Comments Reviewed safety and assist for mobility/ADLs due to fall risk.     Pertinent Vitals/ Pain       Pain Assessment: No/denies pain  Home Living                                          Prior Functioning/Environment              Frequency  Min 3X/week        Progress Toward Goals  OT Goals(current goals can now be found in the care plan section)  Progress towards OT goals: Progressing toward goals  Acute Rehab OT Goals Patient Stated Goal: Wants to be able to manage better OT Goal Formulation: With patient Time For Goal Achievement: 07/07/18 Potential to Achieve Goals: Good  Plan Frequency remains appropriate;Discharge plan needs to be updated    Co-evaluation                 AM-PAC PT "6 Clicks" Daily Activity     Outcome Measure   Help from another person eating meals?: A Little Help from another person taking care of personal grooming?: A Little Help from another person toileting, which includes using toliet, bedpan, or urinal?: A Little Help from another person bathing (including washing, rinsing, drying)?: A Little Help from another person to put on and taking off regular upper body clothing?: A Little Help from another person to put on and taking off regular lower body clothing?: A Little 6 Click Score: 18    End of Session Equipment Utilized During Treatment: Gait belt  OT Visit Diagnosis: Unsteadiness on feet (R26.81)   Activity  Tolerance Patient tolerated treatment well   Patient Left in bed;Other (comment)(hand off to ST)   Nurse Communication Mobility status        Time: 1638-4665 OT Time Calculation (min): 25 min  Charges: OT General Charges $OT Visit: 1 Visit OT Treatments $Self Care/Home Management : 23-37 mins  Delight Stare, Mobile Pager [phone] Office [phone]    Delight Stare 06/26/2018, 2:25 PM

## 2018-06-26 NOTE — Progress Notes (Signed)
  Speech Language Pathology Treatment: Cognitive-Linquistic  Patient Details Name: MEZIAH BLASINGAME MRN: [id number] DOB: [birth year] Today's Date: 06/26/2018 Time: 0901-0920 SLP Time Calculation (min) (ACUTE ONLY): 19 min  Assessment / Plan / Recommendation Clinical Impression  SLP introduced compensatory strategies to facilitate improved short term recall of information as well as improve general speech intelligibility. Patient able to demonstrate intellectual awareness of both memory and speech deficits by verbalizing results of recent evaluation and how impairments may impact overall function (anticipatory awareness). Demonstrated carryover of intelligibility strategies during conversation to improve intelligibility to 100%. Continue to recommend f/u SLP services at SNF as patient was independent prior to and hopes to return home, highly motivated.    HPI HPI: 82 yo male adm to [facility] with right sided hemiparesis, dysarthria and right facial droop.  Pt found to have acute CVA - corona radiata.  PMH + for CVA parietal lobe summer 2019, DM, PAF, HLF, HTN.  Pt for speech evaluation as part of stroke work up.  Pt seen by this SLP previously with cognitive deficits being diagnosed.        SLP Plan  Continue with current plan of care       Recommendations                   Oral Care Recommendations: Oral care BID Follow up Recommendations: Skilled Nursing facility SLP Visit Diagnosis: Dysarthria and anarthria (R47.1);Attention and concentration deficit Attention and concentration deficit following: Cerebral infarction Plan: Continue with current plan of care       GO              Shaely Gadberry MA, CCC-SLP    Cochise Dinneen Meryl 06/26/2018, 9:46 AM

## 2018-06-26 NOTE — Progress Notes (Signed)
Pt BP elevated (see vital signs documentation). MD notified

## 2018-06-26 NOTE — Care Management Important Message (Signed)
Important Message  Patient Details  Name: Brian [name] MRN: [id number] Date of Birth: Oct 06, 1935   Medicare Important Message Given:  Yes    [name] [address] 06/26/2018, 4:35 PM

## 2018-06-26 NOTE — Progress Notes (Signed)
Patient Demographics:    Brian [name], is a 82 y.o. male, DOB - Feb 06, 1936, YTK:[id number]  Admit date - 06/22/2018   Admitting Physician Modena Jansky, MD  Outpatient Primary MD for the patient is [name], MD  LOS - 3   Chief Complaint  Patient presents with  . Stroke Symptoms        Subjective:    Elaine Puckett today has no fevers, no emesis,  No chest pain, no new complaint, right-sided weakness is improving, BP remains high, patient is somewhat unsteady  Assessment  & Plan :    Principal Problem:   Acute ischemic left ACA stroke (HCC) Active Problems:   Hyperlipidemia   Overweight (BMI 25.0-29.9)   Hypertension   Uncontrolled type 2 diabetes mellitus with circulatory disorder causing erectile dysfunction (HCC)   History of CVA (cerebrovascular accident)   PAF (paroxysmal atrial fibrillation) (HCC)   Diastolic dysfunction  Brief Summary 82 year old with past medical history relevant for  PAF , DM2, prior CVA 04/01/18, HTN admitted on 06/22/2018 with right hemiparesis, speech disturbance and facial droop-work-up consistent with acute left anterior communicating artery CVA affecting coronary radiata .  Plan:- 1)Acute Lt ACA CVA---acute left anterior communicating artery CVA affecting coronary radiata , neurology input appreciated, LDL is 42, Even if his lipid panel is within desired limits, patient should still take Lipitor/Statin for it's Pleiotropic effects (beyond cholesterol lowering benefits), continue Lipitor 80 mg daily, continue Eliquis and aspirin as per neurologist, gait is somewhat unsteady, right hemiparesis noted,  2)DM2-last A1c 6.5, good control overall, Use Novolog/Humalog Sliding scale insulin with Accu-Cheks/Fingersticks as ordered   3)PAF- CHA2D-Vasc score 6 (DM2, age (2), HTN,CVA (2))------ continue Eliquis as above #1, currently rate controlled without AV nodal agents,  patient actually had intermittent RVR required IV digoxin at the time, most likely will discharge on metoprolol 12.5 mg twice daily  4)HTN--BP remains elevated after acute stroke, we have allowed some permissive hypertension, may start low-dose amlodipine at 2.5 mg daily   Disposition/Need for in-Hospital Stay- patient unable to be discharged at this time due to patient awaiting insurance approval for SNF rehab due to acute stroke, gait is somewhat unsteady, right hemiparesis noted,  Code Status : full   Disposition Plan  : See above, PT OT eval appreciated  Consults  : Neurology   DVT Prophylaxis  : Apixaban  Lab Results  Component Value Date   PLT 187 06/22/2018    Inpatient Medications  Scheduled Meds: . amLODipine  2.5 mg Oral Daily  . apixaban  5 mg Oral BID WC  . aspirin EC  81 mg Oral Daily  . atorvastatin  80 mg Oral q1800  . gabapentin  100 mg Oral TID  . insulin aspart  0-5 Units Subcutaneous QHS  . insulin aspart  0-9 Units Subcutaneous TID WC   Continuous Infusions: . sodium chloride 10 mL/hr at 06/22/18 2227   PRN Meds:.acetaminophen **OR** acetaminophen (TYLENOL) oral liquid 160 mg/5 mL **OR** acetaminophen, diltiazem, senna-docusate    Anti-infectives (From admission, onward)   None        Objective:   Vitals:   06/26/18 0744 06/26/18 0834 06/26/18 1225 06/26/18 1644  BP: (!) 187/90 (!) 176/84 (!) 157/75 (!) 158/98  Pulse:  62  70 62  Resp: 18  18 18   Temp: 99 F (37.2 C)  98 F (36.7 C) 98.6 F (37 C)  TempSrc: Oral  Oral Oral  SpO2: 98%  99% 100%  Weight:      Height:        Wt Readings from Last 3 Encounters:  06/22/18 96 kg  06/05/18 96.2 kg  06/04/18 96 kg     Intake/Output Summary (Last 24 hours) at 06/26/2018 1724 Last data filed at 06/26/2018 0900 Gross per 24 hour  Intake 770 ml  Output 400 ml  Net 370 ml     Physical Exam Patient is examined daily including today on 06/26/18 , exams remain the same as of yesterday  except that has changed   Gen:- Awake Alert,  In no apparent distress  HEENT:- [place].AT, No sclera icterus Neck-Supple Neck,No JVD,.  Lungs-  CTAB , good air movement CV- S1, S2 normal, irregularly irregular Abd-  +ve B.Sounds, Abd Soft, No tenderness,    Extremity/Skin:- No  edema,   good pulses Psych-affect is appropriate, oriented x3 Neuro- speech is improving, gait is somewhat unsteady, right hemiparesis noted,   Data Review:   Micro Results No results found for this or any previous visit (from the past 240 hour(s)).  Radiology Reports Ct Head Wo Contrast  Result Date: 06/22/2018 CLINICAL DATA:  Right-sided weakness with right facial droop. EXAM: CT HEAD WITHOUT CONTRAST TECHNIQUE: Contiguous axial images were obtained from the base of the skull through the vertex without intravenous contrast. COMPARISON:  04/01/2018. FINDINGS: Brain: There is no evidence for acute hemorrhage, hydrocephalus, mass lesion, or abnormal extra-axial fluid collection. No definite CT evidence for acute infarction. Old right cerebellar infarct noted. Patchy low attenuation in the deep hemispheric and periventricular white matter is nonspecific, but likely reflects chronic microvascular ischemic demyelination. Vascular: No hyperdense vessel or unexpected calcification. Skull: No evidence for fracture. No worrisome lytic or sclerotic lesion. Sinuses/Orbits: The visualized paranasal sinuses and mastoid air cells are clear. Visualized portions of the globes and intraorbital fat are unremarkable. Other: None. IMPRESSION: 1. No acute intracranial abnormality. 2. Old right cerebellar infarct. 3. Chronic microvascular white matter disease Electronically Signed   By: Misty Stanley M.D.   On: 06/22/2018 16:25   Mr Brain Wo Contrast (neuro Protocol)  Result Date: 06/23/2018 CLINICAL DATA:  Focal neuro deficit. Right-sided weakness with slurred speech and facial droop EXAM: MRI HEAD WITHOUT CONTRAST TECHNIQUE: Multiplanar,  multiecho pulse sequences of the brain and surrounding structures were obtained without intravenous contrast. COMPARISON:  04/01/2018 brain MRI.  Head CT from yesterday FINDINGS: Brain: 1 cm acute infarct in the central left corona radiata. Increased diffusion signal in the right cerebral white matter is facilitated by ADC map and related to remote lacunar infarct. Chronic small vessel ischemia causes confluent gliosis in the deep cerebral white matter. Remote small vessel infarct in the right cerebellum. Age normal brain volume. No blood products, hydrocephalus, or masslike finding. Vascular: Major flow voids are preserved Skull and upper cervical spine: No evidence of marrow lesion Sinuses/Orbits: Bilateral cataract resection IMPRESSION: 1. 1 cm acute infarct in the left corona radiata. 2. Chronic small vessel ischemia in the cerebral white matter. Electronically Signed   By: Monte Fantasia M.D.   On: 06/23/2018 01:58     CBC Recent Labs  Lab 06/22/18 1606  WBC 4.7  HGB 13.0  HCT 45.1  PLT 187  MCV 91.1  MCH 26.3  MCHC 28.8*  RDW 13.2  LYMPHSABS 1.2  MONOABS 0.5  EOSABS 0.1  BASOSABS 0.0    Chemistries  Recent Labs  Lab 06/22/18 1606  NA 138  K 4.2  CL 103  CO2 25  GLUCOSE 170*  BUN 18  CREATININE 0.99  CALCIUM 10.0  AST 32  ALT 21  ALKPHOS 60  BILITOT 1.0  ------------------------------------------------------------------------------------------------------------------ No results for input(s): CHOL, HDL, LDLCALC, TRIG, CHOLHDL, LDLDIRECT in the last 72 hours.  Lab Results  Component Value Date   HGBA1C 6.1 (H) 06/23/2018   ------------------------------------------------------------------------------------------------------------------ No results for input(s): TSH, T4TOTAL, T3FREE, THYROIDAB in the last 72 hours.  Invalid input(s): FREET3 ------------------------------------------------------------------------------------------------------------------ No results  for input(s): VITAMINB12, FOLATE, FERRITIN, TIBC, IRON, RETICCTPCT in the last 72 hours.  Coagulation profile Recent Labs  Lab 06/22/18 1606  INR 1.19    No results for input(s): DDIMER in the last 72 hours.  Cardiac Enzymes No results for input(s): CKMB, TROPONINI, MYOGLOBIN in the last 168 hours.  Invalid input(s): CK ------------------------------------------------------------------------------------------------------------------ No results found for: BNP   Roxan Hockey M.D on 06/26/2018 at 5:24 PM  Pager---[phone] Go to [email or web address] - password [email or web address] for contact info  [facility] - Office  [phone]

## 2018-06-27 DIAGNOSIS — M6281 Muscle weakness (generalized): Secondary | ICD-10-CM | POA: Diagnosis not present

## 2018-06-27 DIAGNOSIS — R2689 Other abnormalities of gait and mobility: Secondary | ICD-10-CM | POA: Diagnosis not present

## 2018-06-27 DIAGNOSIS — I602 Nontraumatic subarachnoid hemorrhage from anterior communicating artery: Secondary | ICD-10-CM | POA: Diagnosis not present

## 2018-06-27 DIAGNOSIS — I69051 Hemiplegia and hemiparesis following nontraumatic subarachnoid hemorrhage affecting right dominant side: Secondary | ICD-10-CM | POA: Diagnosis not present

## 2018-06-27 DIAGNOSIS — I63522 Cerebral infarction due to unspecified occlusion or stenosis of left anterior cerebral artery: Secondary | ICD-10-CM | POA: Diagnosis not present

## 2018-06-27 DIAGNOSIS — R2681 Unsteadiness on feet: Secondary | ICD-10-CM | POA: Diagnosis not present

## 2018-06-27 DIAGNOSIS — I69322 Dysarthria following cerebral infarction: Secondary | ICD-10-CM | POA: Diagnosis not present

## 2018-06-27 DIAGNOSIS — I10 Essential (primary) hypertension: Secondary | ICD-10-CM | POA: Diagnosis not present

## 2018-06-27 DIAGNOSIS — R279 Unspecified lack of coordination: Secondary | ICD-10-CM | POA: Diagnosis not present

## 2018-06-27 DIAGNOSIS — R41841 Cognitive communication deficit: Secondary | ICD-10-CM | POA: Diagnosis not present

## 2018-06-27 DIAGNOSIS — I639 Cerebral infarction, unspecified: Secondary | ICD-10-CM | POA: Diagnosis not present

## 2018-06-27 DIAGNOSIS — E119 Type 2 diabetes mellitus without complications: Secondary | ICD-10-CM | POA: Diagnosis not present

## 2018-06-27 DIAGNOSIS — I480 Paroxysmal atrial fibrillation: Secondary | ICD-10-CM | POA: Diagnosis not present

## 2018-06-27 LAB — GLUCOSE, CAPILLARY
GLUCOSE-CAPILLARY: 134 mg/dL — AB (ref 70–99)
Glucose-Capillary: 233 mg/dL — ABNORMAL HIGH (ref 70–99)

## 2018-06-27 MED ORDER — ACETAMINOPHEN 325 MG PO TABS: 650.0000 mg | ORAL_TABLET | ORAL | 1 refills | Status: DC | PRN

## 2018-06-27 MED ORDER — ASPIRIN 81 MG PO TBEC: 81.0000 mg | DELAYED_RELEASE_TABLET | Freq: Every day | ORAL | 2 refills | Status: DC

## 2018-06-27 MED ORDER — GABAPENTIN 100 MG PO CAPS: 100.0000 mg | ORAL_CAPSULE | Freq: Three times a day (TID) | ORAL | 2 refills | Status: DC

## 2018-06-27 MED ORDER — METFORMIN HCL ER (MOD) 1000 MG PO TB24: 1000.0000 mg | ORAL_TABLET | Freq: Two times a day (BID) | ORAL | 1 refills | Status: DC

## 2018-06-27 NOTE — Discharge Instructions (Signed)
1) you are taking apixaban/Eliquis for blood thinner so Avoid ibuprofen/Advil/Aleve/Motrin/Goody Powders/Naproxen/BC powders/Meloxicam/Diclofenac/Indomethacin and other Nonsteroidal anti-inflammatory medications as these will make you more likely to bleed and can cause stomach ulcers, can also cause Kidney problems.

## 2018-06-27 NOTE — Clinical Social Work Note (Signed)
LATE NOTE ENTRY    Clinical Social Work Assessment  Patient Details  Name: Brian [name] MRN: [id number] Date of Birth: Nov 17, 1935  Date of referral:  06/24/18               Reason for consult:  Facility Placement                Permission sought to share information with:  Facility Sport and exercise psychologist, Family Supports Permission granted to share information::  Yes, Verbal Permission Granted  Name::     Social worker::  [place]  Relationship::  Brother  Sport and exercise psychologist Information:     Housing/Transportation Living arrangements for the past 2 months:  Single Family Home Source of Information:  Patient, Medical Team Patient Interpreter Needed:  None Criminal Activity/Legal Involvement Pertinent to Current Situation/Hospitalization:  No - Comment as needed Significant Relationships:  Siblings Lives with:  Self Do you feel safe going back to the place where you live?  Yes Need for family participation in patient care:  No (Coment)  Care giving concerns:  Patient from home alone, will benefit from short term rehab.   Social Worker assessment / plan:  CSW alerted by medical team yesterday that patient agreeable to SNF, wanted to be faxed out. CSW followed up with patient preference, [name], and confirmed bed offer and availability for patient. [name] will initiate insurance authorization with [employer]. CSW sent therapy notes to SNF. CSW to follow.  Employment status:  Retired Nurse, adult PT Recommendations:  Inpatient [place] / Referral to community resources:  [place]  Patient/Family's Response to care:  Patient agreeable to SNF, preference for [place].  Patient/Family's Understanding of and Emotional Response to Diagnosis, Current Treatment, and Prognosis:  Patient appreciative of assistance in coordinating transfer to SNF. Patient aware of deficits, and has goal to get back home without needing assistance as he  only has his brother who can check on him periodically.   Emotional Assessment Appearance:  Appears stated age Attitude/Demeanor/Rapport:  Engaged Affect (typically observed):  Pleasant Orientation:  Oriented to Self, Oriented to Place, Oriented to  Time, Oriented to Situation Alcohol / Substance use:  Not Applicable Psych involvement (Current and /or in the community):  No (Comment)  Discharge Needs  Concerns to be addressed:  Care Coordination Readmission within the last 30 days:  No Current discharge risk:  Physical Impairment, Dependent with Mobility, Lives alone Barriers to Discharge:  Continued Medical Work up, [place], [place] 06/27/2018, 12:30 PM

## 2018-06-27 NOTE — Discharge Summary (Signed)
Brian [name], is a 82 y.o. male  DOB 04/11/1936  MRN [id number].  Admission date:  06/22/2018  Admitting Physician  Modena Jansky, MD  Discharge Date:  06/27/2018   Primary MD  [name], MD  Recommendations for primary care physician for things to follow:  1) you are taking apixaban/Eliquis for blood thinner so Avoid ibuprofen/Advil/Aleve/Motrin/Goody Powders/Naproxen/BC powders/Meloxicam/Diclofenac/Indomethacin and other Nonsteroidal anti-inflammatory medications as these will make you more likely to bleed and can cause stomach ulcers, can also cause Kidney problems.    Admission Diagnosis  Stroke-like symptoms [R29.90]   Discharge Diagnosis  Stroke-like symptoms [R29.90]    Principal Problem:   Acute ischemic left ACA stroke (HCC) Active Problems:   Hyperlipidemia   Overweight (BMI 25.0-29.9)   Hypertension   Uncontrolled type 2 diabetes mellitus with circulatory disorder causing erectile dysfunction (HCC)   History of CVA (cerebrovascular accident)   PAF (paroxysmal atrial fibrillation) (HCC)   Diastolic dysfunction      Past Medical History:  Diagnosis Date  . ALLERGIC RHINITIS 10/13/2007  . ANEMIA, CHRONIC DISEASE NEC 03/31/2007  . BENIGN PROSTATIC HYPERTROPHY 03/31/2007  . Chest pain    Stress echo, normal, December, 2012  . Chronic anemia   . DIABETES MELLITUS, TYPE II 03/31/2007  . [place] DISEASE, LUMBAR 03/31/2007  . ED (erectile dysfunction)   . Ejection fraction    EF  normal, stress echo, December,  2012  . History of prostatitis   . HYPERLIPIDEMIA 03/31/2007  . HYPERTENSION 03/31/2007  . Nephrolithiasis 12/07/2010  . Obesity   . POLYP, ANAL AND RECTAL 03/31/2007  . Right eye trauma    hx as child  . Stroke [place])     Past Surgical History:  Procedure Laterality Date  . [place] SURGERY     1999  . PROSTATE BIOPSY     s/p  . RECTAL POLYPECTOMY     Transanal  excision 10/2005  . SKIN BIOPSY     s/p right upper back 2009- benign Dr. Tonia Brooms     HPI  from the history and physical done on the day of admission:    Chief Complaint: Right-sided weakness, slurred speech, facial droop.  HPI: Brian [name] is a pleasant 82 year old male, recently widowed 11/14/2017, lives alone, ambulates with the help of a cane, right handed, PMH of DM 2, HTN, HLD, CVA 04/01/2018 with no residual weakness, recently diagnosed PAF by 30-day event monitor placed on outpatient and started on Eliquis which he reportedly started 06/19/2018, presented to [facility] ED on 06/22/2018 due to right-sided weakness, slurred speech and facial droop that began the day prior to admission.  Patient reports that he was in his usual state of health until 10/5 when he was coming out of eyeglass store and at around 12 noon-1 PM noted that he was walking strangely due to weakness in his right lower extremity, had to lift up his leg and put it down, was unsteady and had to hold on to something to prevent fall.  When he  went home, he noted that he was also weak in the right upper extremity, was unable to write like he usually did or flip pages from a book.  He denied any tingling or numbness.  No headache or loss of consciousness.  He however did not seek any medical attention at that time.  On day of admission, when he looked in the mirror he noted that he had facial droop towards the right side, drooling of saliva on the right side and slurred speech at which time he decided to come to the ED.  He states that there has been no improvement or worsening of his symptoms since yesterday.  ED Course: CT head shows no acute intracranial abnormality, old right cerebellar infarct and chronic microvascular white matter disease.  Lab work only significant for glucose of 170.  Neurology was consulted by EDP.   Hospital Course:    Brief Summary 82 year old with past medical history relevant for  PAF , DM2, prior CVA 04/01/18,  HTN admitted on 06/22/2018 with right hemiparesis, speech disturbance and facial droop-work-up consistent with acute left anterior communicating artery CVA affecting coronary radiata .  Plan:- 1)Acute Lt ACA CVA---acute left anterior communicating artery CVA affecting coronary radiata , neurology input appreciated, LDL is 42, Even if his lipid panel is within desired limits, patient should still take Lipitor/Statin for it's Pleiotropic effects (beyond cholesterol lowering benefits), continue Lipitor 80 mg daily, continue Eliquis and aspirin as per neurologist, gait is somewhat unsteady, right hemiparesis noted, d/c to SNF for rehab  2)DM2-last A1c 6.5, good control overall, restart metformin and Januvia   3)PAF- CHA2D-Vasc score 6 (DM2, age (2), HTN,CVA (2))------ continue Eliquis as above #1, currently rate controlled without AV nodal agents, patient actually had intermittent RVR required IV digoxin at the time, will discharge on metoprolol   4)HTN--BP remains elevated after acute stroke, we have allowed some permissive hypertension, may increase amlodipine to 5 mg daily  Code Status : full  Disposition Plan  : See above, PT OT eval appreciated  Consults  : Neurology  DVT Prophylaxis  : Apixaban  Discharge Condition: stable  Follow UP   Contact information for follow-up providers    [facility]. Go on 08/06/2018.   Specialty:  Neurology Contact information: [address] [place] [phone]           Contact information for after-discharge care    Destination    [name] .   Service:  Skilled Nursing Contact information: [place] [phone]                  Diet and Activity recommendation:  As advised  Discharge Instructions    Discharge Instructions    Call MD for:  difficulty breathing, headache or visual disturbances   Complete  by:  As directed    Call MD for:  persistant dizziness or light-headedness   Complete by:  As directed    Call MD for:  persistant nausea and vomiting   Complete by:  As directed    Call MD for:  severe uncontrolled pain   Complete by:  As directed    Call MD for:  temperature >100.4   Complete by:  As directed    Diet - low sodium heart healthy   Complete by:  As directed    Discharge instructions   Complete by:  As directed    1) you are taking apixaban/Eliquis  for blood thinner so Avoid ibuprofen/Advil/Aleve/Motrin/Goody Powders/Naproxen/BC powders/Meloxicam/Diclofenac/Indomethacin and other Nonsteroidal anti-inflammatory medications as these will make you more likely to bleed and can cause stomach ulcers, can also cause Kidney problems.   Increase activity slowly   Complete by:  As directed        Discharge Medications     Allergies as of 06/27/2018   No Known Allergies     Medication List    TAKE these medications   ACCU-CHEK SOFTCLIX LANCETS lancets Use as instructed twice per day Dx Code E11.9   acetaminophen 325 MG tablet Commonly known as:  TYLENOL Take 2 tablets (650 mg total) by mouth every 4 (four) hours as needed for mild pain or fever (or temp > 37.5 C (99.5 F)).   amLODipine 5 MG tablet Commonly known as:  NORVASC Take 1 tablet (5 mg total) by mouth daily.   apixaban 5 MG Tabs tablet Commonly known as:  ELIQUIS Take 1 tablet (5 mg total) by mouth 2 (two) times daily. What changed:    when to take this  additional instructions   aspirin 81 MG EC tablet Take 1 tablet (81 mg total) by mouth daily. With breakfast Start taking on:  06/28/2018   atorvastatin 80 MG tablet Commonly known as:  LIPITOR Take 1 tablet (80 mg total) by mouth daily at 6 PM.   fluticasone 50 MCG/ACT nasal spray Commonly known as:  FLONASE Place 2 sprays into both nostrils daily as needed. What changed:  reasons to take this   gabapentin 100 MG capsule Commonly known as:   NEURONTIN Take 1 capsule (100 mg total) by mouth 3 (three) times daily. What changed:  See the new instructions.   glucose blood test strip Use as instructed twice per day Dx E11.9   losartan 100 MG tablet Commonly known as:  COZAAR Take 1 tablet (100 mg total) by mouth daily.   metFORMIN 1000 MG (MOD) 24 hr tablet Commonly known as:  GLUMETZA Take 1 tablet (1,000 mg total) by mouth 2 (two) times daily before a meal. What changed:    medication strength  when to take this   OVER THE COUNTER MEDICATION Place 1 drop into both eyes daily as needed (dry eyes). Over the counter lubricating eye drop   sitaGLIPtin 100 MG tablet Commonly known as:  JANUVIA Take 1 tablet (100 mg total) by mouth daily. What changed:  when to take this       Major procedures and Radiology Reports - PLEASE review detailed and final reports for all details, in brief -   Ct Head Wo Contrast  Result Date: 06/22/2018 CLINICAL DATA:  Right-sided weakness with right facial droop. EXAM: CT HEAD WITHOUT CONTRAST TECHNIQUE: Contiguous axial images were obtained from the base of the skull through the vertex without intravenous contrast. COMPARISON:  04/01/2018. FINDINGS: Brain: There is no evidence for acute hemorrhage, hydrocephalus, mass lesion, or abnormal extra-axial fluid collection. No definite CT evidence for acute infarction. Old right cerebellar infarct noted. Patchy low attenuation in the deep hemispheric and periventricular white matter is nonspecific, but likely reflects chronic microvascular ischemic demyelination. Vascular: No hyperdense vessel or unexpected calcification. Skull: No evidence for fracture. No worrisome lytic or sclerotic lesion. Sinuses/Orbits: The visualized paranasal sinuses and mastoid air cells are clear. Visualized portions of the globes and intraorbital fat are unremarkable. Other: None. IMPRESSION: 1. No acute intracranial abnormality. 2. Old right cerebellar infarct. 3. Chronic  microvascular white matter disease Electronically Signed   By: Randall Hiss  Tery Sanfilippo M.D.   On: 06/22/2018 16:25   Mr Brain Wo Contrast (neuro Protocol)  Result Date: 06/23/2018 CLINICAL DATA:  Focal neuro deficit. Right-sided weakness with slurred speech and facial droop EXAM: MRI HEAD WITHOUT CONTRAST TECHNIQUE: Multiplanar, multiecho pulse sequences of the brain and surrounding structures were obtained without intravenous contrast. COMPARISON:  04/01/2018 brain MRI.  Head CT from yesterday FINDINGS: Brain: 1 cm acute infarct in the central left corona radiata. Increased diffusion signal in the right cerebral white matter is facilitated by ADC map and related to remote lacunar infarct. Chronic small vessel ischemia causes confluent gliosis in the deep cerebral white matter. Remote small vessel infarct in the right cerebellum. Age normal brain volume. No blood products, hydrocephalus, or masslike finding. Vascular: Major flow voids are preserved Skull and upper cervical spine: No evidence of marrow lesion Sinuses/Orbits: Bilateral cataract resection IMPRESSION: 1. 1 cm acute infarct in the left corona radiata. 2. Chronic small vessel ischemia in the cerebral white matter. Electronically Signed   By: Monte Fantasia M.D.   On: 06/23/2018 01:58    Micro Results    No results found for this or any previous visit (from the past 240 hour(s)).   Today   Subjective    Nas Hosek today has no new complaints, Rt  Sided Hemiparesis and speech issues are improving        Patient has been seen and examined prior to discharge   Objective   Blood pressure (!) 170/80, pulse 61, temperature (!) 97.5 F (36.4 C), temperature source Oral, resp. rate 18, height 6\' 1"  (1.854 m), weight 96 kg, SpO2 98 %.   Intake/Output Summary (Last 24 hours) at 06/27/2018 1102 Last data filed at 06/27/2018 0800 Gross per 24 hour  Intake 480 ml  Output 0 ml  Net 480 ml    Exam Gen:- Awake Alert,  In no apparent distress    HEENT:- [place].AT, No sclera icterus Neck-Supple Neck,No JVD,.  Lungs-  CTAB , fairly symmetrical air movement CV- S1, S2 normal, regular Abd-  +ve B.Sounds, Abd Soft, No tenderness,    Extremity/Skin:- No  edema,   good pulses Psych-affect is appropriate, oriented x3 Neuro-right-sided hemiparesis is improving, right-sided facial droop and speech concerns are also improving, no tremors   Data Review   CBC w Diff:  Lab Results  Component Value Date   WBC 4.7 06/22/2018   HGB 13.0 06/22/2018   HCT 45.1 06/22/2018   PLT 187 06/22/2018   LYMPHOPCT 26 06/22/2018   MONOPCT 10 06/22/2018   EOSPCT 2 06/22/2018   BASOPCT 1 06/22/2018    CMP:  Lab Results  Component Value Date   NA 138 06/22/2018   K 4.2 06/22/2018   CL 103 06/22/2018   CO2 25 06/22/2018   BUN 18 06/22/2018   CREATININE 0.99 06/22/2018   PROT 7.3 06/22/2018   ALBUMIN 4.4 06/22/2018   BILITOT 1.0 06/22/2018   ALKPHOS 60 06/22/2018   AST 32 06/22/2018   ALT 21 06/22/2018  . Total Discharge time is about 33 minutes  Roxan Hockey M.D on 06/27/2018 at 11:02 AM  Pager---[phone]  Go to [email or web address] - password [email or web address] for contact info  [facility] - Office  [phone]

## 2018-06-27 NOTE — Progress Notes (Signed)
CSW following for discharge. CSW contacted by [facility] that [employer] was requesting a peer to peer review, sent information to MD. MD will conduct peer to peer review.  CSW to follow.  Laveda Abbe, [place] Clinical Social Worker [phone]

## 2018-06-27 NOTE — Clinical Social Work Placement (Signed)
Nurse to call report to [phone], Room [place]  NOTE  Date:  06/27/2018  Patient Details  Name: Brian [name] MRN: [id number] Date of Birth: 09-24-1935  Clinical Social Work is seeking post-discharge placement for this patient at the [place] level of care (*CSW will initial, date and re-position this form in  chart as items are completed):  Yes   Patient/family provided with [place] Work Department's list of facilities offering this level of care within the geographic area requested by the patient (or if unable, by the patient's family).  Yes   Patient/family informed of their freedom to choose among providers that offer the needed level of care, that participate in Medicare, Medicaid or managed care program needed by the patient, have an available bed and are willing to accept the patient.  Yes   Patient/family informed of [place]'s ownership interest in [facility] and [facility], as well as of the fact that they are under no obligation to receive care at these facilities.  PASRR submitted to EDS on       PASRR number received on       Existing PASRR number confirmed on       FL2 transmitted to all facilities in geographic area requested by pt/family on       FL2 transmitted to all facilities within larger geographic area on       Patient informed that his/her managed care company has contracts with or will negotiate with certain facilities, including the following:        Yes   Patient/family informed of bed offers received.  Patient chooses bed at [facility]     Physician recommends and patient chooses bed at      Patient to be transferred to [facility] on 06/27/18.  Patient to be transferred to facility by Family car     Patient family notified on 06/27/18 of transfer.  Name of family member notified:        PHYSICIAN       Additional Comment:     _______________________________________________ Geralynn Ochs, LCSW 06/27/2018, 12:03 PM

## 2018-06-27 NOTE — Care Management Important Message (Signed)
Important Message  Patient Details  Name: Brian [name] MRN: [id number] Date of Birth: [birth year]   Medicare Important Message Given:  Yes    [name] Montine Circle 06/27/2018, 12:44 PM

## 2018-06-30 ENCOUNTER — Ambulatory Visit: Payer: Medicare HMO | Admitting: Internal Medicine

## 2018-06-30 DIAGNOSIS — I10 Essential (primary) hypertension: Secondary | ICD-10-CM | POA: Diagnosis not present

## 2018-06-30 DIAGNOSIS — I480 Paroxysmal atrial fibrillation: Secondary | ICD-10-CM | POA: Diagnosis not present

## 2018-06-30 DIAGNOSIS — I639 Cerebral infarction, unspecified: Secondary | ICD-10-CM | POA: Diagnosis not present

## 2018-06-30 DIAGNOSIS — E119 Type 2 diabetes mellitus without complications: Secondary | ICD-10-CM | POA: Diagnosis not present

## 2018-07-01 DIAGNOSIS — I69322 Dysarthria following cerebral infarction: Secondary | ICD-10-CM | POA: Diagnosis not present

## 2018-07-01 DIAGNOSIS — R2689 Other abnormalities of gait and mobility: Secondary | ICD-10-CM | POA: Diagnosis not present

## 2018-07-03 ENCOUNTER — Ambulatory Visit (HOSPITAL_COMMUNITY): Payer: Medicare HMO | Admitting: Nurse Practitioner

## 2018-07-04 ENCOUNTER — Other Ambulatory Visit: Payer: Self-pay | Admitting: Internal Medicine

## 2018-07-07 DIAGNOSIS — M6281 Muscle weakness (generalized): Secondary | ICD-10-CM | POA: Diagnosis not present

## 2018-07-07 DIAGNOSIS — E119 Type 2 diabetes mellitus without complications: Secondary | ICD-10-CM | POA: Diagnosis not present

## 2018-07-07 DIAGNOSIS — I480 Paroxysmal atrial fibrillation: Secondary | ICD-10-CM | POA: Diagnosis not present

## 2018-07-07 DIAGNOSIS — I639 Cerebral infarction, unspecified: Secondary | ICD-10-CM | POA: Diagnosis not present

## 2018-07-07 DIAGNOSIS — R279 Unspecified lack of coordination: Secondary | ICD-10-CM | POA: Diagnosis not present

## 2018-07-07 DIAGNOSIS — R41841 Cognitive communication deficit: Secondary | ICD-10-CM | POA: Diagnosis not present

## 2018-07-07 DIAGNOSIS — R2689 Other abnormalities of gait and mobility: Secondary | ICD-10-CM | POA: Diagnosis not present

## 2018-07-07 DIAGNOSIS — I69322 Dysarthria following cerebral infarction: Secondary | ICD-10-CM | POA: Diagnosis not present

## 2018-07-07 DIAGNOSIS — I63522 Cerebral infarction due to unspecified occlusion or stenosis of left anterior cerebral artery: Secondary | ICD-10-CM | POA: Diagnosis not present

## 2018-07-07 DIAGNOSIS — R2681 Unsteadiness on feet: Secondary | ICD-10-CM | POA: Diagnosis not present

## 2018-07-07 DIAGNOSIS — I10 Essential (primary) hypertension: Secondary | ICD-10-CM | POA: Diagnosis not present

## 2018-07-07 DIAGNOSIS — I602 Nontraumatic subarachnoid hemorrhage from anterior communicating artery: Secondary | ICD-10-CM | POA: Diagnosis not present

## 2018-07-11 ENCOUNTER — Other Ambulatory Visit: Payer: Self-pay | Admitting: *Deleted

## 2018-07-11 ENCOUNTER — Encounter: Payer: Self-pay | Admitting: *Deleted

## 2018-07-11 NOTE — Patient Outreach (Addendum)
[facility]) Care Management  07/11/2018  Brian [name] Oct 26, 1935 [id number]   EMMI-stroke  (hospital d/c 06/27/18 to [facility]- acute ischemic left ACA stroke)  RED ON EMMI ALERT Day # 13 Date: 07/10/18 1001 Red Alert Reason: went to follow up appointment? No  Scheduled a follow up appointment? No  Insurance [employer] attempt # 1 unsuccessful at the home number listed  No answer. [facility] RN CM left HIPAA compliant voicemail message along with CM's contact info.    This CM has made contact with this patient previously   Social: Brian [name] is a recent widow (11/14/17-Brian [name]) who lives alone but has support of his brother He ambulates with a cane   Conditions: Acute ischemic left ACA stroke, Overweight (BMI 25.0-29.9), Uncontrolled type 2 diabetes mellitus with circulatory disorder causing erectile dysfunction, hx of CVA, recently diagnosed PAF by 30-day event monitor placed on outpatient and started on Eliquis which he reportedly started 17/03/1164, diastolic dysfunction, chronic anemia, allergic rhinitis, chest pain, lumbar disc disease, right eye trauma as a child,   Plan: [facility] RN CM sent an unsuccessful outreach letter and scheduled this patient for another call attempt within 4 business days   Lorea Kupfer L. Lavina Hamman, RN, BSN, [place] Coordinator Office number [phone] Mobile number [phone]  Main [facility] number [phone] Fax number [phone]

## 2018-07-14 ENCOUNTER — Other Ambulatory Visit: Payer: Self-pay | Admitting: *Deleted

## 2018-07-14 NOTE — Patient Outreach (Addendum)
  [facility]) Care Management  07/14/2018  Brian [name] [place] 30-Jan-1936 [id number]   EMMI-stroke  (hospital d/c 06/27/18 to [facility]- acute ischemic left ACA stroke)  RED ON EMMI ALERT Day # 13 Date: 07/10/18 1001 Red Alert Reason: went to follow up appointment? No  Scheduled a follow up appointment? No  Insurance [employer] attempt # 2 unsuccessful at the home number listed  No answer. [facility] RN CM left HIPAA compliant voicemail message along with CM's contact info.  [facility] RN CM called [place] and spoke with Marcie Bal to confirm [name] did d/c from [facility] on 07/07/18  [facility] RN called and left a message with his brother [name] as listed per his DPR in Epic [name] confirms [name] left [place] and went to stay with his son in [place] for a short period of time and is doing okay.  [name] voiced appreciation with CM following up with [name]    This CM has made contact with this patient previously   Social: [name] is a recent widow (11/14/17-Louise) who lives alone but has support of his brother He ambulates with a cane   Conditions: Acute ischemic left ACA stroke, Overweight (BMI 25.0-29.9), Uncontrolled type 2 diabetes mellitus with circulatory disorder causing erectile dysfunction, hx of CVA, recently diagnosed PAF by 30-day event monitor placed on outpatient and started on Eliquis which he reportedly started 21/09/9415, diastolic dysfunction, chronic anemia, allergic rhinitis, chest pain, lumbar disc disease, right eye trauma as a child,   Plan: [facility] RN CM scheduled this patient for another call attempt within 7 business days   Krystle Oberman L. Lavina Hamman, RN, BSN, [place] Coordinator Office number [phone] Mobile number [phone]  Main [facility] number [phone] Fax number [phone]

## 2018-07-21 ENCOUNTER — Other Ambulatory Visit: Payer: Self-pay | Admitting: *Deleted

## 2018-07-21 NOTE — Patient Outreach (Signed)
[facility]) Care Management  07/21/2018  Brian [name] [place] 1936/01/31 [id number]   EMMI-stroke(hospital d/c 06/27/18 to [facility]- acute ischemic left ACA stroke)  RED ON EMMI ALERT Day #13 Date:07/10/18 1001 Red Alert Reason:went to follow up appointment? No  Scheduled a follow up appointment? No Product manager  Patient returned a call to [facility] RN CM Patient is able to verify HIPAA Reviewed and addressed referral to [facility] with patient Brian [name] confirms he ws out of state with his son in [place] and enjoyed his time. He reports he began having some of the same cva s/s, went to ED, Was hospitalized, discharged to rehab and spent a week with his son.  He reports that "I really did not feel sick this time and they really did not do much at the rehab."  Brian [name] report the EMMI answers are correct He did not have f/u appointment with his primary MD because he went out of state with his son. He will be scheduling the follow up appointment today since he has recently returned back home  Social: Brian [name] continues to live alone but has the help of his brother for transportation to and from medical appointments. He is independent with all his care.  He is not driving since his first CVA s/s as his MD informed him it would be best not to drive for 6 months He is hoping that will change soon. He reports missing the opportunity to go places by himself st the time he wants to go like church, grocery store, etc.  CM discussed possible community resources but he does not prefer to use these resources.   Brian [name] is a recent widow (11/14/17-Brian [name]) He has a sister and a daughter that he reports will be visiting for Thanksgiving and he plans on purchasing food from a local store. He ambulates with a cane  Conditions: Medications: denies concerns with taking medications as prescribed, affording medications, side effects of medications and questions about  medications  Appointments: Cm and Brian [name] went over his medical appointments in detail about 5 times as he was noted to repeat incorrect times at intervals   07/28/18 0840 CVD Dr Dorris Carnes  07/28/18 1415 dentist  11/20 neurologist 81 NP Brian [name] - 3 month f/u He reports he will be getting his brother to take him to these appointments He inquired about the purpose of a neurologist was and Cm reviewed this  He reports he keeps all appointments in a book down stairs but today he called CM while in bed and re wrote all appointments to put in his book  He will call Dr Cathlean Cower after speaking with CM to scheduled a hospital f/u but confirms he had been seen to get his flu shot on 06/05/18 "saw him about a week before I went in the hospital but everything was okay then" He denies any lost of interest in doing things or depression.   Advance Directives: Denies need for assist with advance directives    Consent: [facility] RN CM reviewed [facility] services with patient. Patient gave verbal consent for services. CM offered [facility] RN CM but Brian Dayal refused the offer   Advised patient that there will be further automated EMMI-post discharge calls to assess how the patient is doing following the recent hospitalization Advised the patient that another call may be received from a nurse if any of their responses were abnormal. Patient voiced understanding and was appreciative of f/u call.  Plan  [facility] RN CM will close case at this time as patient has been assessed and no needs identified.    [facility] RN CM provided CM contact number again Routed note to MDs listed in [place]. Lavina Hamman, RN, BSN, [place] Coordinator Office number [phone] Mobile number [phone]  Main [facility] number [phone] Fax number [phone]

## 2018-07-23 DIAGNOSIS — Z7984 Long term (current) use of oral hypoglycemic drugs: Secondary | ICD-10-CM | POA: Diagnosis not present

## 2018-07-23 DIAGNOSIS — E785 Hyperlipidemia, unspecified: Secondary | ICD-10-CM | POA: Diagnosis not present

## 2018-07-23 DIAGNOSIS — I4891 Unspecified atrial fibrillation: Secondary | ICD-10-CM | POA: Diagnosis not present

## 2018-07-23 DIAGNOSIS — E1142 Type 2 diabetes mellitus with diabetic polyneuropathy: Secondary | ICD-10-CM | POA: Diagnosis not present

## 2018-07-23 DIAGNOSIS — Z833 Family history of diabetes mellitus: Secondary | ICD-10-CM | POA: Diagnosis not present

## 2018-07-23 DIAGNOSIS — Z809 Family history of malignant neoplasm, unspecified: Secondary | ICD-10-CM | POA: Diagnosis not present

## 2018-07-23 DIAGNOSIS — I10 Essential (primary) hypertension: Secondary | ICD-10-CM | POA: Diagnosis not present

## 2018-07-23 DIAGNOSIS — Z7982 Long term (current) use of aspirin: Secondary | ICD-10-CM | POA: Diagnosis not present

## 2018-07-23 DIAGNOSIS — Z7901 Long term (current) use of anticoagulants: Secondary | ICD-10-CM | POA: Diagnosis not present

## 2018-07-23 DIAGNOSIS — Z8249 Family history of ischemic heart disease and other diseases of the circulatory system: Secondary | ICD-10-CM | POA: Diagnosis not present

## 2018-07-25 ENCOUNTER — Other Ambulatory Visit: Payer: Self-pay | Admitting: Internal Medicine

## 2018-07-25 ENCOUNTER — Encounter: Payer: Self-pay | Admitting: Internal Medicine

## 2018-07-28 ENCOUNTER — Encounter: Payer: Self-pay | Admitting: Internal Medicine

## 2018-07-28 ENCOUNTER — Ambulatory Visit: Payer: Medicare HMO | Admitting: Internal Medicine

## 2018-07-28 VITALS — BP 172/100 | HR 70 | Ht 73.0 in | Wt 209.0 lb

## 2018-07-28 DIAGNOSIS — I639 Cerebral infarction, unspecified: Secondary | ICD-10-CM | POA: Diagnosis not present

## 2018-07-28 DIAGNOSIS — I10 Essential (primary) hypertension: Secondary | ICD-10-CM | POA: Diagnosis not present

## 2018-07-28 DIAGNOSIS — I480 Paroxysmal atrial fibrillation: Secondary | ICD-10-CM

## 2018-07-28 MED ORDER — AMLODIPINE BESYLATE 10 MG PO TABS: 10.0000 mg | ORAL_TABLET | Freq: Every day | ORAL | 3 refills | Status: DC

## 2018-07-28 NOTE — Patient Instructions (Signed)
Medication Instructions:  Your physician has recommended you make the following change in your medication:  1.) increase amlodipine to 10 mg once a day  If you need a refill on your cardiac medications before your next appointment, please call your pharmacy.   Lab work: none If you have labs (blood work) drawn today and your tests are completely normal, you will receive your results only by: [name] MyChart Message (if you have MyChart) OR . A paper copy in the mail If you have any lab test that is abnormal or we need to change your treatment, we will call you to review the results.  Testing/Procedures: none  . Follow-Up: . BP check with nurse in one month; bring your cuff and list of readings  Any Other Special Instructions Will Be Listed Below (If Applicable). none

## 2018-07-28 NOTE — Progress Notes (Signed)
Cardiology Office Note   Date:  07/28/2018   ID:  Brian [name], DOB 08-May-1936, MRN [id number]  PCP:  [name], MD  Cardiologist:   Dorris Carnes, MD       History of Present Illness:  Brian [name] is a 82 y.o. male with a h/o DM, HTN, CVA diagnosed 04/01/18. A 30 day  event monitor was placed  and showed an episode of afib so he was  referred to the afib clinic  He was placed on Eliquis   CHADSVAAc 6    PT was admitted to [facility] in [employer] October with new CVA    Sent home on ASA and Elquiis and lipitor   Since d/c he says his breathing  is OK   No dizziness   No new weakness   Not doing as much walking   Would like to go to gym Takes BP at home   Says it is better    Uses wrist cuff    Current Meds  Medication Sig  . ACCU-CHEK SOFTCLIX LANCETS lancets Use as instructed twice per day Dx Code E11.9  . acetaminophen (TYLENOL) 325 MG tablet Take 2 tablets (650 mg total) by mouth every 4 (four) hours as needed for mild pain or fever (or temp > 37.5 C (99.5 F)).  [name] amLODipine (NORVASC) 5 MG tablet Take 1 tablet (5 mg total) by mouth daily.  [name] apixaban (ELIQUIS) 5 MG TABS tablet Take 1 tablet (5 mg total) by mouth 2 (two) times daily. (Patient taking differently: Take 5 mg by mouth 2 (two) times daily with a meal. 8am and 6pm)  . aspirin EC 81 MG EC tablet Take 1 tablet (81 mg total) by mouth daily. With breakfast  . atorvastatin (LIPITOR) 80 MG tablet Take 1 tablet (80 mg total) by mouth daily at 6 PM.  . clopidogrel (PLAVIX) 75 MG tablet TAKE 1 TABLET BY MOUTH EVERY DAY  . fluticasone (FLONASE) 50 MCG/ACT nasal spray Place 2 sprays into both nostrils daily as needed. (Patient taking differently: Place 2 sprays into both nostrils daily as needed for allergies or rhinitis. )  . gabapentin (NEURONTIN) 100 MG capsule Take 1 capsule (100 mg total) by mouth 3 (three) times daily.  [name] glucose blood (ACCU-CHEK AVIVA) test strip Use as instructed twice per day Dx E11.9  . JANUVIA 100 MG tablet  TAKE 1 TABLET BY MOUTH EVERY DAY  . losartan (COZAAR) 100 MG tablet Take 1 tablet (100 mg total) by mouth daily.  . metFORMIN (GLUMETZA) 1000 MG (MOD) 24 hr tablet Take 1 tablet (1,000 mg total) by mouth 2 (two) times daily before a meal.  . OVER THE COUNTER MEDICATION Place 1 drop into both eyes daily as needed (dry eyes). Over the counter lubricating eye drop     Allergies:   Patient has no known allergies.   Past Medical History:  Diagnosis Date  . ALLERGIC RHINITIS 10/13/2007  . ANEMIA, CHRONIC DISEASE NEC 03/31/2007  . BENIGN PROSTATIC HYPERTROPHY 03/31/2007  . Chest pain    Stress echo, normal, December, 2012  . Chronic anemia   . DIABETES MELLITUS, TYPE II 03/31/2007  . [place] DISEASE, LUMBAR 03/31/2007  . ED (erectile dysfunction)   . Ejection fraction    EF  normal, stress echo, December,  2012  . History of prostatitis   . HYPERLIPIDEMIA 03/31/2007  . HYPERTENSION 03/31/2007  . Nephrolithiasis 12/07/2010  . Obesity   . POLYP, ANAL AND RECTAL 03/31/2007  .  Right eye trauma    hx as child  . Stroke [facility])     Past Surgical History:  Procedure Laterality Date  . [place] SURGERY     1999  . PROSTATE BIOPSY     s/p  . RECTAL POLYPECTOMY     Transanal excision 10/2005  . SKIN BIOPSY     s/p right upper back 2009- benign Dr. Tonia Brooms     Social History:  The patient  reports that he quit smoking about 24 years ago. He quit after 10.00 years of use. He has never used smokeless tobacco. He reports that he drinks about 1.0 standard drinks of alcohol per week. He reports that he does not use drugs.   Family History:  The patient's family history includes Colon polyps in his mother; Stroke in his mother.    ROS:  Please see the history of present illness. All other systems are reviewed and  Negative to the above problem except as noted.    PHYSICAL EXAM: VS:  BP (!) 172/100 Comment: 190/110 manual left arm  Pulse 70   Ht 6\' 1"  (1.854 m)   Wt 209 lb (94.8 kg)   BMI 27.57  kg/m   GEN: Overweight, in no acute distress  HEENT: normal  Neck: no JVD, carotid bruits, or masses Cardiac: RRR with occasional skip  ; no murmurs, rubs, or gallops,No signif   edema  Respiratory:  clear to auscultation bilaterally, normal work of breathing GI: soft, nontender, nondistended, + BS  No hepatomegaly  MS: no deformity Moving all extremities   Skin: warm and dry, no rash Neuro:  Deferred   Psych: euthymic mood, full affect   EKG:  EKG is ordered today. SR 70   PR 228  LVH     Lipid Panel    Component Value Date/Time   CHOL 95 06/23/2018 0700   TRIG 62 06/23/2018 0700   HDL 41 06/23/2018 0700   CHOLHDL 2.3 06/23/2018 0700   VLDL 12 06/23/2018 0700   LDLCALC 42 06/23/2018 0700      Wt Readings from Last 3 Encounters:  07/28/18 209 lb (94.8 kg)  06/22/18 211 lb 10.3 oz (96 kg)  06/05/18 212 lb (96.2 kg)      ASSESSMENT AND PLAN:  `  PAF  Pt is asymptamotic  Picked up on monitor  Continue anticoagulation   Also on ASA and PLavix   Need to review with neuro.    2  HTN  BP was extremely elevated in afib fib clinci   Eelevated today    He says it is better at howm   Has wrist cuff I would recomm that he increase amlodipine to 10 mg    Follow BP   Keep log  Track of times   Bring in log and BP cuff in 4 wks    Adjust meds as needed after review and BP check    3  CVA  Pt now has had 2 events    He has f/u in neuro in 1 wk          Current medicines are reviewed at length with the patient today.  The patient does not have concerns regarding medicines.  Signed, Dorris Carnes, MD  07/28/2018 9:11 AM    [address], [place], [place]  [address] Phone: [phone]; Fax: [phone]

## 2018-07-30 ENCOUNTER — Ambulatory Visit (INDEPENDENT_AMBULATORY_CARE_PROVIDER_SITE_OTHER): Payer: Medicare HMO | Admitting: Internal Medicine

## 2018-07-30 ENCOUNTER — Encounter: Payer: Self-pay | Admitting: Internal Medicine

## 2018-07-30 ENCOUNTER — Encounter

## 2018-07-30 VITALS — BP 134/86 | HR 79 | Temp 97.7°F | Ht 73.0 in | Wt 205.0 lb

## 2018-07-30 DIAGNOSIS — E1159 Type 2 diabetes mellitus with other circulatory complications: Secondary | ICD-10-CM

## 2018-07-30 DIAGNOSIS — I10 Essential (primary) hypertension: Secondary | ICD-10-CM

## 2018-07-30 DIAGNOSIS — E1165 Type 2 diabetes mellitus with hyperglycemia: Secondary | ICD-10-CM

## 2018-07-30 DIAGNOSIS — N521 Erectile dysfunction due to diseases classified elsewhere: Secondary | ICD-10-CM

## 2018-07-30 DIAGNOSIS — I639 Cerebral infarction, unspecified: Secondary | ICD-10-CM

## 2018-07-30 DIAGNOSIS — IMO0002 Reserved for concepts with insufficient information to code with codable children: Secondary | ICD-10-CM

## 2018-07-30 NOTE — Patient Instructions (Addendum)
Please continue all other medications as before, and refills have been done if requested.  Please have the pharmacy call with any other refills you may need.  Please continue your efforts at being more active, low cholesterol diet, and weight control..  Please keep your appointments with your specialists as you may have planned - neurology next week  Please return in 6 months, or sooner if needed, with Lab testing done 3-5 days before

## 2018-07-30 NOTE — Assessment & Plan Note (Signed)
stable overall by history and exam, recent data reviewed with pt, and pt to continue medical treatment as before,  to f/u any worsening symptoms or concerns  

## 2018-07-30 NOTE — Progress Notes (Signed)
Subjective:    Patient ID: Brian [name], male    DOB: 11/07/1935, 82 y.o.   MRN: [id number]  HPI  Here to f/u recent hospn with brother who drives him, with 1 cm left corona radiata stroke with garbled speech, right facial and UE weakness, about 4 days after changed from plavix to eliquis as outpatient.  S/p 1 wk rehab but overall much improved already.  Pt denies new neurological symptoms such as new headache, or facial or extremity weakness or numbness  Pt denies chest pain, increased sob or doe, wheezing, orthopnea, PND, increased LE swelling, palpitations, dizziness or syncope.   Pt denies polydipsia, polyuria.   Pt denies fever, wt loss, night sweats, loss of appetite, or other constitutional symptoms No new complaints.  Past Medical History:  Diagnosis Date  . ALLERGIC RHINITIS 10/13/2007  . ANEMIA, CHRONIC DISEASE NEC 03/31/2007  . BENIGN PROSTATIC HYPERTROPHY 03/31/2007  . Chest pain    Stress echo, normal, December, 2012  . Chronic anemia   . DIABETES MELLITUS, TYPE II 03/31/2007  . [place] DISEASE, LUMBAR 03/31/2007  . ED (erectile dysfunction)   . Ejection fraction    EF  normal, stress echo, December,  2012  . History of prostatitis   . HYPERLIPIDEMIA 03/31/2007  . HYPERTENSION 03/31/2007  . Nephrolithiasis 12/07/2010  . Obesity   . POLYP, ANAL AND RECTAL 03/31/2007  . Right eye trauma    hx as child  . Stroke [facility])    Past Surgical History:  Procedure Laterality Date  . [place] SURGERY     1999  . PROSTATE BIOPSY     s/p  . RECTAL POLYPECTOMY     Transanal excision 10/2005  . SKIN BIOPSY     s/p right upper back 2009- benign Dr. Tonia Brooms    reports that he quit smoking about 24 years ago. He quit after 10.00 years of use. He has never used smokeless tobacco. He reports that he drinks about 1.0 standard drinks of alcohol per week. He reports that he does not use drugs. family history includes Colon polyps in his mother; Stroke in his mother. No Known Allergies Current  Outpatient Medications on File Prior to Visit  Medication Sig Dispense Refill  . ACCU-CHEK SOFTCLIX LANCETS lancets Use as instructed twice per day Dx Code E11.9 200 each 3  . acetaminophen (TYLENOL) 325 MG tablet Take 2 tablets (650 mg total) by mouth every 4 (four) hours as needed for mild pain or fever (or temp > 37.5 C (99.5 F)). 20 tablet 1  . amLODipine (NORVASC) 10 MG tablet Take 1 tablet (10 mg total) by mouth daily. (Patient taking differently: Take 20 mg by mouth daily. ) 90 tablet 3  . apixaban (ELIQUIS) 5 MG TABS tablet Take 1 tablet (5 mg total) by mouth 2 (two) times daily. (Patient taking differently: Take 5 mg by mouth 2 (two) times daily with a meal. 8am and 6pm) 60 tablet 6  . aspirin EC 81 MG EC tablet Take 1 tablet (81 mg total) by mouth daily. With breakfast 30 tablet 2  . atorvastatin (LIPITOR) 80 MG tablet Take 1 tablet (80 mg total) by mouth daily at 6 PM. 90 tablet 1  . fluticasone (FLONASE) 50 MCG/ACT nasal spray Place 2 sprays into both nostrils daily as needed. (Patient taking differently: Place 2 sprays into both nostrils daily as needed for allergies or rhinitis. ) 16 g 5  . gabapentin (NEURONTIN) 100 MG capsule Take 1 capsule (100 mg  total) by mouth 3 (three) times daily. 90 capsule 2  . glucose blood (ACCU-CHEK AVIVA) test strip Use as instructed twice per day Dx E11.9 200 each 3  . JANUVIA 100 MG tablet TAKE 1 TABLET BY MOUTH EVERY DAY 90 tablet 3  . losartan (COZAAR) 100 MG tablet Take 1 tablet (100 mg total) by mouth daily. 90 tablet 1  . metFORMIN (GLUMETZA) 1000 MG (MOD) 24 hr tablet Take 1 tablet (1,000 mg total) by mouth 2 (two) times daily before a meal. 60 tablet 1  . OVER THE COUNTER MEDICATION Place 1 drop into both eyes daily as needed (dry eyes). Over the counter lubricating eye drop     No current facility-administered medications on file prior to visit.    Review of Systems  Constitutional: Negative for other unusual diaphoresis or sweats HENT:  Negative for ear discharge or swelling Eyes: Negative for other worsening visual disturbances Respiratory: Negative for stridor or other swelling  Gastrointestinal: Negative for worsening distension or other blood Genitourinary: Negative for retention or other urinary change Musculoskeletal: Negative for other MSK pain or swelling Skin: Negative for color change or other new lesions Neurological: Negative for worsening tremors and other numbness  Psychiatric/Behavioral: Negative for worsening agitation or other fatigue All other system neg per pt    Objective:   Physical Exam BP 134/86   Pulse 79   Temp 97.7 F (36.5 C) (Oral)   Ht 6\' 1"  (1.854 m)   Wt 205 lb (93 kg)   SpO2 98%   BMI 27.05 kg/m  VS noted,  Constitutional: Pt appears in NAD HENT: Head: NCAT.  Right Ear: External ear normal.  Left Ear: External ear normal.  Eyes: . Pupils are equal, round, and reactive to light. Conjunctivae and EOM are normal Nose: without d/c or deformity Neck: Neck supple. Gross normal ROM Cardiovascular: Normal rate and regular rhythm.   Pulmonary/Chest: Effort normal and breath sounds without rales or wheezing.  Abd:  Soft, NT, ND, + BS, no organomegaly Neurological: Pt is alert. At baseline orientation, motor grossly intact except has subtle right facial weakness and speech change, as well as very mild right grip strength loss Skin: Skin is warm. No rashes, other new lesions, no LE edema Psychiatric: Pt behavior is normal without agitation  No other exam findings Lab Results  Component Value Date   WBC 4.7 06/22/2018   HGB 13.0 06/22/2018   HCT 45.1 06/22/2018   PLT 187 06/22/2018   GLUCOSE 170 (H) 06/22/2018   CHOL 95 06/23/2018   TRIG 62 06/23/2018   HDL 41 06/23/2018   LDLCALC 42 06/23/2018   ALT 21 06/22/2018   AST 32 06/22/2018   NA 138 06/22/2018   K 4.2 06/22/2018   CL 103 06/22/2018   CREATININE 0.99 06/22/2018   BUN 18 06/22/2018   CO2 25 06/22/2018   TSH 1.62  07/18/2017   PSA 2.64 07/18/2017   INR 1.19 06/22/2018   HGBA1C 6.1 (H) 06/23/2018   MICROALBUR 1.7 07/18/2017       Assessment & Plan:

## 2018-07-30 NOTE — Assessment & Plan Note (Signed)
stable overall by history and exam, recent data reviewed with pt, and pt to continue medical treatment as before,  to f/u any worsening symptoms or concerns, cont eliquis and statin

## 2018-08-05 DIAGNOSIS — I639 Cerebral infarction, unspecified: Secondary | ICD-10-CM | POA: Diagnosis not present

## 2018-08-05 DIAGNOSIS — H35033 Hypertensive retinopathy, bilateral: Secondary | ICD-10-CM | POA: Diagnosis not present

## 2018-08-05 DIAGNOSIS — E113293 Type 2 diabetes mellitus with mild nonproliferative diabetic retinopathy without macular edema, bilateral: Secondary | ICD-10-CM | POA: Diagnosis not present

## 2018-08-05 DIAGNOSIS — E119 Type 2 diabetes mellitus without complications: Secondary | ICD-10-CM | POA: Diagnosis not present

## 2018-08-05 LAB — HM DIABETES EYE EXAM

## 2018-08-06 ENCOUNTER — Ambulatory Visit: Payer: Medicare HMO | Admitting: Adult Health

## 2018-08-07 ENCOUNTER — Telehealth: Payer: Self-pay

## 2018-08-07 ENCOUNTER — Encounter: Payer: Self-pay | Admitting: Adult Health

## 2018-08-07 NOTE — Telephone Encounter (Signed)
PT was late for appt on 08/06/2018. Pt reschedule.

## 2018-08-08 NOTE — Progress Notes (Signed)
[facility] [address] [place]. [place] [address] [phone]       OFFICE FOLLOW UP NOTE  [name] Date of Birth:  June 03, 1936 Medical Record Number:  [id number]   Reason for Referral:  seizure activity and stroke follow up  CHIEF COMPLAINT:  Chief Complaint  Patient presents with  . Follow-up    Stroke follow up room 9 pt with walter his brother, pt wants to drive now     HPI: Brian [name] is being seen today in the office for seizure activity and right occipital lobe infarct on 04/01/2018. History obtained from patient, brother and chart review. Reviewed all radiology images and labs personally.  Brian [name] is a 82 year old male with history of BPH, DM, HLD, HTN who presented with seizure-like activity with left arm stiffness and left gaze preference, lasting for a few minutes which was ollowed by speech difficulty and drooling.  Symptoms resolved rapidly.  EEG negative for seizure activity and patient denied history of seizure.  CTA head and neck reviewed and showed left distal M1 70% stenosis in the right VA origin atherosclerosis.  MRI head reviewed and showed punctate right occipital infarct and chronic right cerebellum infarct.    2D echo showed an EF 45 to 50%.    Lower extremity Doppler was negative for DVT.  A1c 5.4 and LDL 152.  It was determined that his presentation was concerning for seizure-like activity with postictal changes.  However no history of seizure and EEG negative.  However MRI showed right punctate infarct which cannot fully explain the seizure activity.  Etiology unclear at this moment, however the punctate right parietal occipital infarct could be due to small vessel disease versus embolic source.    Recommended aspirin and Plavix DAPT for 3 weeks and then Plavix alone.    Also recommended continuation of Lipitor. Recommend 30-day cardio event monitoring as outpatient to rule out A. Fib.  Patient was discharged home in satisfactory  condition.  05/06/2018 visit: Since hospital discharge, patient has been doing well and is accompanied at today's visit by his brother.  Patient is currently wearing 30-day cardiac monitor and will be completed this coming Saturday.  He denies any seizure activity or stroke/TIA symptoms.  Brother believes that seizure was related to increased stress due to recent traveling along with not taking any of his medications due to traveling.  Continues on Plavix as he is completed 3-week of DAPT therapy and denies side effects of bleeding or bruising.  Continues to take Lipitor without side effects of myalgias.  Blood pressure today 147/60 and does monitor this at home with typical SBP 130s.  Does monitor BG's at home which have been stable.  Patient is planning on taking a trip to [place] to visit family members next month.  Denies new or worsening stroke/TIA symptoms.  08/11/2018 interval history: Patient returns today for 16-month follow-up visit.  He did complete 30-day cardiac monitor which showed evidence of PAF and was placed on Eliquis which started on 06/19/2018.  Patient presented to ED on 06/22/2018 due to right-sided weakness, slurred speech and facial droop.  CT head was negative for acute abnormality.  MRI brain reviewed and showed left CR infarct.  Infarct secondary to small vessel disease source.  It was recommended to continue Eliquis twice daily along with initiating aspirin 81 mg daily for secondary stroke prevention.  Recommended long-term BP goal 1 30-1 50 given left M1 70% stenosis that was found on CTA head  and neck performed on 03/2018.  No indication for repeat during this admission.  Recommended continuation of atorvastatin 80 mg daily for HLD management.  He has been recovering well without residual symptoms. Continues to take eliquis and aspirin 81mg  without bleeding or bruising. Continues to take Lipitor without side effects of myalgias. Blood pressure today 103/75. Does monitor at home and  typically SBP 140's. Blood pressure yesterday evening 140s/78.  He does state that this morning when he initially got out of bed, he did feel little "woozy" along with a warm sensation and did not sleep well overnight.  Upon assessment, rapid heart rate auscultated apically counting approximately 155 bpm regular rate and rhythm.  Attempted for repeat blood pressure manually but unable to auscultate BP reading.  Automatic blood pressure repeated with a reading of 84/58.  Temp 36.1C orally.  Patient denies current symptoms of dizziness, lightheadedness, visual changes or neurological symptoms.  He denies any type of S OB, palpitations or chest pains.  He does endorse eating well and drinking plenty of fluids but unable to state exact amount during the day.  Highly recommended patient be evaluated in the ED for further evaluation and possible stroke/MI prevention.  Patient states that he feels as though he has a bowel movement this will correct the numbers.  Patient agreed to attempt to have a bowel movement, will drink water and will reassess.  Rechecked manual blood pressure 90/58 and heart rate 142.  He continues to deny any type of symptoms.  Due to continued elevated heart rate and hypotension, it was highly recommended again for patient to be evaluated in the ED via EMS.  Patient agreed and EMS was called.  During the interval time, patient laid down on examination table to help perfuse.  As EMS arrived, blood pressure rechecked for 142/85 with heart rate 73 while supine position.  In sitting position, blood pressure rechecked for 144/86 and heart rate 93.  Patient was transported to ED via EMS.     ROS:   14 system review of systems performed and negative with exception of no complaints  PMH:  Past Medical History:  Diagnosis Date  . ALLERGIC RHINITIS 10/13/2007  . ANEMIA, CHRONIC DISEASE NEC 03/31/2007  . BENIGN PROSTATIC HYPERTROPHY 03/31/2007  . Chest pain    Stress echo, normal, December, 2012    . Chronic anemia   . DIABETES MELLITUS, TYPE II 03/31/2007  . [place] DISEASE, LUMBAR 03/31/2007  . ED (erectile dysfunction)   . Ejection fraction    EF  normal, stress echo, December,  2012  . History of prostatitis   . HYPERLIPIDEMIA 03/31/2007  . HYPERTENSION 03/31/2007  . Nephrolithiasis 12/07/2010  . Obesity   . POLYP, ANAL AND RECTAL 03/31/2007  . Right eye trauma    hx as child  . Stroke [facility])     PSH:  Past Surgical History:  Procedure Laterality Date  . [place] SURGERY     1999  . PROSTATE BIOPSY     s/p  . RECTAL POLYPECTOMY     Transanal excision 10/2005  . SKIN BIOPSY     s/p right upper back 2009- benign Dr. Tonia Brooms    Social History:  Social History   Socioeconomic History  . Marital status: Widowed    Spouse name: Not on file  . Number of children: 2  . Years of education: Not on file  . Highest education level: Not on file  Occupational History  . Occupation: Acupuncturist  Comment: Retired 1999  Social Needs  . Financial resource strain: Not on file  . Food insecurity:    Worry: Not on file    Inability: Not on file  . Transportation needs:    Medical: Not on file    Non-medical: Not on file  Tobacco Use  . Smoking status: Former Smoker    Years: 10.00    Last attempt to quit: 1995    Years since quitting: 24.9  . Smokeless tobacco: Never Used  Substance and Sexual Activity  . Alcohol use: Yes    Alcohol/week: 1.0 standard drinks    Types: 1 Cans of beer per week    Comment: occasional or yearly  . Drug use: No  . Sexual activity: Not on file  Lifestyle  . Physical activity:    Days per week: Not on file    Minutes per session: Not on file  . Stress: Not on file  Relationships  . Social connections:    Talks on phone: Not on file    Gets together: Not on file    Attends religious service: Not on file    Active member of club or organization: Not on file    Attends meetings of clubs or organizations: Not on file     Relationship status: Not on file  . Intimate partner violence:    Fear of current or ex partner: Not on file    Emotionally abused: Not on file    Physically abused: Not on file    Forced sexual activity: Not on file  Other Topics Concern  . Not on file  Social History Narrative  . Not on file    Family History:  Family History  Problem Relation Age of Onset  . Colon polyps Mother   . Stroke Mother        due to ICA stenosis  . CVA Neg Hx   . Seizures Neg Hx     Medications:   Current Outpatient Medications on File Prior to Visit  Medication Sig Dispense Refill  . ACCU-CHEK SOFTCLIX LANCETS lancets Use as instructed twice per day Dx Code E11.9 200 each 3  . acetaminophen (TYLENOL) 325 MG tablet Take 2 tablets (650 mg total) by mouth every 4 (four) hours as needed for mild pain or fever (or temp > 37.5 C (99.5 F)). 20 tablet 1  . amLODipine (NORVASC) 10 MG tablet Take 1 tablet (10 mg total) by mouth daily. (Patient taking differently: Take 20 mg by mouth daily. ) 90 tablet 3  . apixaban (ELIQUIS) 5 MG TABS tablet Take 1 tablet (5 mg total) by mouth 2 (two) times daily. (Patient taking differently: Take 5 mg by mouth 2 (two) times daily with a meal. 8am and 6pm) 60 tablet 6  . aspirin EC 81 MG EC tablet Take 1 tablet (81 mg total) by mouth daily. With breakfast 30 tablet 2  . atorvastatin (LIPITOR) 80 MG tablet Take 1 tablet (80 mg total) by mouth daily at 6 PM. 90 tablet 1  . gabapentin (NEURONTIN) 100 MG capsule Take 1 capsule (100 mg total) by mouth 3 (three) times daily. 90 capsule 2  . glucose blood (ACCU-CHEK AVIVA) test strip Use as instructed twice per day Dx E11.9 200 each 3  . JANUVIA 100 MG tablet TAKE 1 TABLET BY MOUTH EVERY DAY 90 tablet 3  . losartan (COZAAR) 100 MG tablet Take 1 tablet (100 mg total) by mouth daily. 90 tablet 1  . metFORMIN (GLUMETZA)  1000 MG (MOD) 24 hr tablet Take 1 tablet (1,000 mg total) by mouth 2 (two) times daily before a meal. 60 tablet 1  .  OVER THE COUNTER MEDICATION Place 1 drop into both eyes daily as needed (dry eyes). Over the counter lubricating eye drop     No current facility-administered medications on file prior to visit.     Allergies:  No Known Allergies   Physical Exam  Vitals:   08/11/18 1006  BP: 103/75  Pulse: 67  Weight: 206 lb (93.4 kg)   Body mass index is 27.18 kg/m. No exam data present  General: well developed, well nourished, pleasant elderly [place] male, seated, in no evident distress Head: head normocephalic and atraumatic.   Neck: supple with no carotid or supraclavicular bruits Cardiovascular: Tachycardic with regular rate and rhythm, no murmurs   Neurologic Exam Mental Status: Awake and fully alert. Oriented to place and time. Recent and remote memory intact. Attention span, concentration and fund of knowledge appropriate. Mood and affect appropriate.  Cranial Nerves: Unable to assess due to abnormal vital readings Motor: Unable to assess due to abnormal vital readings Sensory.:  Unable to assess due to abnormal vital readings Coordination: Unable to assess due to abnormal vital readings Gait and Station: Patient was ambulating to bathroom, he was able to stand independently without difficulty.  Upon walking, patient did sway with holding onto walls for assistance but was able to ambulate to the bathroom without assistance Reflexes: Unable to assess due to abnormal readings       Diagnostic Data (Labs, Imaging, Testing)  CT head without contrast 04/01/2018 IMPRESSION: 1. 5 mm acute ischemic nonhemorrhagic subcortical infarct at the right parieto-occipital region, posterior right MCA distribution. 2. Age-related cerebral atrophy with moderate chronic small vessel ischemic disease.  CTA head/neck with and without contrast 04/01/2018 IMPRESSION: Mild atherosclerotic change at both carotid bifurcations. Slight irregularity of the ICA bulb on the right, but without  measurable stenosis. Intracranial medium vessel atherosclerotic change. 70% stenosis of the distal M1 segment on the left. I do not identify any occluded branches. 50% stenosis of the right vertebral artery origin.  MRI brain without contrast 04/01/2018 IMPRESSION: 1. 5 mm acute ischemic nonhemorrhagic subcortical infarct at the right parieto-occipital region, posterior right MCA distribution. 2. Age-related cerebral atrophy with moderate chronic small vessel ischemic disease.     ASSESSMENT: RAYLAND HAMED is a 82 y.o. year old male here with right parietal-occipital region infarct on 04/01/2018 secondary to small vessel disease versus cardioembolic source along with seizure activity. Vascular risk factors include DM, HTN and HLD.  30-day cardiac monitor showed evidence of atrial fibrillation and was placed on Eliquis.  On 06/22/2018, patient was found to have left CR infarct secondary to small vessel disease.  Patient returns today for follow-up visit and was found to be hypotensive with tachycardia and was transported to ED via EMS.    PLAN: -Advised patient to be evaluated in the ED due to hypotension and tachycardia.  His blood pressure and heart rate normalized after 3 bottles of water and laying in the supine position.  Most likely due to dehydration but due to history of left M1 stenosis and history of atrial fibrillation, patient was educated on importance of being evaluated further in the ED with correction of blood pressure and heart rate to prevent recurrent stroke and prevent possible MI.  Unable to adequately complete 53-month follow-up regarding stroke.  Recommended for patient to reschedule follow-up appointment in 2 weeks time  for evaluation and follow-up of stroke.  Patient was transported to ED via EMS.  Follow up in 2 weeks or call earlier if needed   Greater than 50% of time during this 60-minute visit was spent obtaining vital signs along with educating patient on  importance of obtaining satisfactory vital signs as important to prevent hypotension due to left M1 stenosis along with ensuring heart rhythm is regular and does not show evidence of AF in RVR which can only be treated in the hospital setting.  He was also advised the importance of ruling out dehydration or possible infection as cause of abnormal vital readings.    Venancio Poisson, AGNP-BC  [facility] [address] [place],  [address]  Phone [phone] Fax [phone]

## 2018-08-11 ENCOUNTER — Encounter: Payer: Self-pay | Admitting: Adult Health

## 2018-08-11 ENCOUNTER — Emergency Department (HOSPITAL_COMMUNITY)
Admission: EM | Admit: 2018-08-11 | Discharge: 2018-08-11 | Disposition: A | Discharge status: Home / Self Care | Payer: Medicare HMO | Attending: Emergency Medicine | Admitting: Emergency Medicine

## 2018-08-11 ENCOUNTER — Ambulatory Visit: Payer: Medicare HMO | Admitting: Adult Health

## 2018-08-11 ENCOUNTER — Other Ambulatory Visit: Payer: Self-pay

## 2018-08-11 ENCOUNTER — Encounter (HOSPITAL_COMMUNITY): Payer: Self-pay

## 2018-08-11 VITALS — BP 90/58 | HR 142 | Temp 96.9°F | Wt 206.0 lb

## 2018-08-11 DIAGNOSIS — I959 Hypotension, unspecified: Secondary | ICD-10-CM | POA: Insufficient documentation

## 2018-08-11 DIAGNOSIS — G4489 Other headache syndrome: Secondary | ICD-10-CM | POA: Diagnosis not present

## 2018-08-11 DIAGNOSIS — R0789 Other chest pain: Secondary | ICD-10-CM | POA: Diagnosis not present

## 2018-08-11 DIAGNOSIS — R079 Chest pain, unspecified: Secondary | ICD-10-CM | POA: Diagnosis not present

## 2018-08-11 DIAGNOSIS — Z7984 Long term (current) use of oral hypoglycemic drugs: Secondary | ICD-10-CM | POA: Insufficient documentation

## 2018-08-11 DIAGNOSIS — R0602 Shortness of breath: Secondary | ICD-10-CM | POA: Diagnosis not present

## 2018-08-11 DIAGNOSIS — I639 Cerebral infarction, unspecified: Secondary | ICD-10-CM

## 2018-08-11 DIAGNOSIS — Z79899 Other long term (current) drug therapy: Secondary | ICD-10-CM | POA: Insufficient documentation

## 2018-08-11 DIAGNOSIS — E119 Type 2 diabetes mellitus without complications: Secondary | ICD-10-CM | POA: Diagnosis not present

## 2018-08-11 DIAGNOSIS — E1165 Type 2 diabetes mellitus with hyperglycemia: Secondary | ICD-10-CM | POA: Diagnosis not present

## 2018-08-11 DIAGNOSIS — Z87891 Personal history of nicotine dependence: Secondary | ICD-10-CM | POA: Insufficient documentation

## 2018-08-11 DIAGNOSIS — Z7982 Long term (current) use of aspirin: Secondary | ICD-10-CM | POA: Insufficient documentation

## 2018-08-11 DIAGNOSIS — R000 Tachycardia, unspecified: Secondary | ICD-10-CM

## 2018-08-11 LAB — I-STAT CHEM 8, ED
BUN: 17 mg/dL (ref 8–23)
CALCIUM ION: 1.22 mmol/L (ref 1.15–1.40)
CREATININE: 1.2 mg/dL (ref 0.61–1.24)
Chloride: 99 mmol/L (ref 98–111)
GLUCOSE: 222 mg/dL — AB (ref 70–99)
HEMATOCRIT: 40 % (ref 39.0–52.0)
HEMOGLOBIN: 13.6 g/dL (ref 13.0–17.0)
Potassium: 4.1 mmol/L (ref 3.5–5.1)
Sodium: 138 mmol/L (ref 135–145)
TCO2: 30 mmol/L (ref 22–32)

## 2018-08-11 NOTE — ED Provider Notes (Signed)
[facility] EMERGENCY DEPARTMENT Provider Note   CSN: [id number] Arrival date & time: 08/11/18  1210     History   Chief Complaint Chief Complaint  Patient presents with  . Hypotension    HPI Brian [name] is a 82 y.o. male.  HPI 82 year old male presents the emergency department with reported tachycardia and hypotension at the neurology office today.  He has been without any symptoms today.  He was being seen in neurology for stroke follow-up.  He denies unilateral arm or leg weakness.  No palpitations.  No chest pain or shortness of breath.  Denies abdominal pain.  Reports his appetite is been normal.  Denies nausea vomiting diarrhea.  He states he felt normal this morning and had no complaints while at the doctor's office including absent for palpitations.  He was sent to the ER for further evaluation.  On EMS arrival the patient's blood pressure was 148/88 and his heart rate was 80.  His blood sugar was 322.  He continued to deny complaints.  He had no complaints for EMS.  He has no complaints here in the emergency department.  He has stable vital signs.  Past Medical History:  Diagnosis Date  . ALLERGIC RHINITIS 10/13/2007  . ANEMIA, CHRONIC DISEASE NEC 03/31/2007  . BENIGN PROSTATIC HYPERTROPHY 03/31/2007  . Chest pain    Stress echo, normal, December, 2012  . Chronic anemia   . DIABETES MELLITUS, TYPE II 03/31/2007  . [place] DISEASE, LUMBAR 03/31/2007  . ED (erectile dysfunction)   . Ejection fraction    EF  normal, stress echo, December,  2012  . History of prostatitis   . HYPERLIPIDEMIA 03/31/2007  . HYPERTENSION 03/31/2007  . Nephrolithiasis 12/07/2010  . Obesity   . POLYP, ANAL AND RECTAL 03/31/2007  . Right eye trauma    hx as child  . Stroke [facility])     Patient Active Problem List   Diagnosis Date Noted  . Acute ischemic left ACA stroke ([place]) 06/23/2018  . History of CVA (cerebrovascular accident)   . PAF (paroxysmal atrial fibrillation) ([place])   .  Diastolic dysfunction   . Acute CVA (cerebrovascular accident) ([place]) 04/09/2018  . Cerebral embolism with cerebral infarction 04/02/2018  . Right leg pain 12/04/2017  . Grief 12/04/2017  . Uncontrolled type 2 diabetes mellitus with circulatory disorder causing erectile dysfunction ([place]) 10/02/2016  . Allergic conjunctivitis 01/12/2016  . Abdominal pain, other specified site 06/23/2012  . Erectile dysfunction 12/13/2011  . Ejection fraction   . Fatigue 06/14/2011  . Encounter for well adult exam with abnormal findings 06/10/2011  . Nephrolithiasis 12/07/2010  . Hypertension 12/07/2010  . SKIN LESION 03/01/2008  . ALLERGIC RHINITIS 10/13/2007  . Hyperlipidemia 03/31/2007  . Overweight (BMI 25.0-29.9) 03/31/2007  . ANEMIA, CHRONIC DISEASE NEC 03/31/2007  . POLYP, ANAL AND RECTAL 03/31/2007  . BENIGN PROSTATIC HYPERTROPHY 03/31/2007  . [place] DISEASE, LUMBAR 03/31/2007    Past Surgical History:  Procedure Laterality Date  . [place] SURGERY     1999  . PROSTATE BIOPSY     s/p  . RECTAL POLYPECTOMY     Transanal excision 10/2005  . SKIN BIOPSY     s/p right upper back 2009- benign Dr. Tonia Brooms        Home Medications    Prior to Admission medications   Medication Sig Start Date End Date Taking? Authorizing Provider  ACCU-CHEK SOFTCLIX LANCETS lancets Use as instructed twice per day Dx Code E11.9 12/19/17   Gherghe,  Salena Saner, MD  acetaminophen (TYLENOL) 325 MG tablet Take 2 tablets (650 mg total) by mouth every 4 (four) hours as needed for mild pain or fever (or temp > 37.5 C (99.5 F)). 06/27/18   Roxan Hockey, MD  amLODipine (NORVASC) 10 MG tablet Take 1 tablet (10 mg total) by mouth daily. Patient taking differently: Take 20 mg by mouth daily.  07/28/18   Fay Records, MD  apixaban (ELIQUIS) 5 MG TABS tablet Take 1 tablet (5 mg total) by mouth 2 (two) times daily. Patient taking differently: Take 5 mg by mouth 2 (two) times daily with a meal. 8am and 6pm 06/12/18    Sherran Needs, NP  aspirin EC 81 MG EC tablet Take 1 tablet (81 mg total) by mouth daily. With breakfast 06/28/18   Roxan Hockey, MD  atorvastatin (LIPITOR) 80 MG tablet Take 1 tablet (80 mg total) by mouth daily at 6 PM. 06/05/18   [name], MD  gabapentin (NEURONTIN) 100 MG capsule Take 1 capsule (100 mg total) by mouth 3 (three) times daily. 06/27/18   Roxan Hockey, MD  glucose blood (ACCU-CHEK AVIVA) test strip Use as instructed twice per day Dx E11.9 12/19/17   Philemon Kingdom, MD  JANUVIA 100 MG tablet TAKE 1 TABLET BY MOUTH EVERY DAY 07/25/18   Philemon Kingdom, MD  losartan (COZAAR) 100 MG tablet Take 1 tablet (100 mg total) by mouth daily. 06/05/18   [name], MD  metFORMIN (GLUMETZA) 1000 MG (MOD) 24 hr tablet Take 1 tablet (1,000 mg total) by mouth 2 (two) times daily before a meal. 06/27/18   Emokpae, Courage, MD  OVER THE COUNTER MEDICATION Place 1 drop into both eyes daily as needed (dry eyes). Over the counter lubricating eye drop    [provider]    Family History Family History  Problem Relation Age of Onset  . Colon polyps Mother   . Stroke Mother        due to ICA stenosis  . CVA Neg Hx   . Seizures Neg Hx     Social History Social History   Tobacco Use  . Smoking status: Former Smoker    Years: 10.00    Last attempt to quit: 1995    Years since quitting: 24.9  . Smokeless tobacco: Never Used  Substance Use Topics  . Alcohol use: Yes    Alcohol/week: 1.0 standard drinks    Types: 1 Cans of beer per week    Comment: occasional or yearly  . Drug use: No     Allergies   Patient has no known allergies.   Review of Systems Review of Systems  All other systems reviewed and are negative.    Physical Exam Updated Vital Signs BP 130/86 (BP Location: Right Arm)   Pulse 77   Temp (!) 97.2 F (36.2 C) (Oral)   Resp 18   SpO2 100%   Physical Exam  Constitutional: He is oriented to person, place, and time. He appears  well-developed and well-nourished.  HENT:  Head: Normocephalic and atraumatic.  Eyes: EOM are normal.  Neck: Normal range of motion.  Cardiovascular: Normal rate, regular rhythm, normal heart sounds and intact distal pulses.  Pulmonary/Chest: Effort normal and breath sounds normal. No respiratory distress.  Abdominal: Soft. He exhibits no distension. There is no tenderness.  Musculoskeletal: Normal range of motion.  Neurological: He is alert and oriented to person, place, and time.  Skin: Skin is warm and dry.  Psychiatric: He  has a normal mood and affect. Judgment normal.  Nursing note and vitals reviewed.    ED Treatments / Results  Labs (all labs ordered are listed, but only abnormal results are displayed) Labs Reviewed  I-STAT CHEM 8, ED - Abnormal; Notable for the following components:      Result Value   Glucose, Bld 222 (*)    All other components within normal limits    EKG EKG Interpretation  Date/Time:  Monday August 11 2018 12:21:58 EST Ventricular Rate:  75 PR Interval:    QRS Duration: 102 QT Interval:  381 QTC Calculation: 426 R Axis:   -50 Text Interpretation:  Sinus rhythm Prolonged PR interval LAD, consider left anterior fascicular block Abnormal R-wave progression, early transition Left ventricular hypertrophy Anterior Q waves, possibly due to LVH No significant change was found Confirmed by Jola Schmidt [phone]) on 08/11/2018 12:56:38 PM   Radiology No results found.  Procedures Procedures (including critical care time)  Medications Ordered in ED Medications - No data to display   Initial Impression / Assessment and Plan / ED Course  I have reviewed the triage vital signs and the nursing notes.  Pertinent labs & imaging results that were available during my care of the patient were reviewed by me and considered in my medical decision making (see chart for details).     Observed in the emergency department.  Hemoglobin stable.  Electrolytes  and creatinine without abnormality.  Blood sugar noted to be 222.  No hypotension or tachycardia in the ER.  Patient will sinus rhythm at this time.  He said no arrhythmias.  He was observed in the ER for some time and will be discharged home.  He continues without complaint at this time.  Close primary care follow-up.  Unclear etiology of hypotension and tachycardia while at the neurology office.  Final Clinical Impressions(s) / ED Diagnoses   Final diagnoses:  Hypotension, unspecified hypotension type    ED Discharge Orders    None       Jola Schmidt, MD 08/11/18 1711

## 2018-08-11 NOTE — ED Triage Notes (Signed)
[facility]- pt coming from [facility] neurology after provider reported hypotension and tachycardia. BP initially 80/50- HR 160. When EMS arrived, BP 148/88 and HR 80. CBG 322. Pt denies dizziness or weakness. Transferred from gurney to bed on arrival.

## 2018-08-11 NOTE — Progress Notes (Signed)
I agree with the above plan 

## 2018-08-11 NOTE — ED Notes (Signed)
MD at bedside. 

## 2018-08-11 NOTE — ED Notes (Signed)
Pt ambulated to and from restroom without difficulty.   

## 2018-08-26 ENCOUNTER — Encounter: Payer: Self-pay | Admitting: Cardiovascular Disease

## 2018-08-26 ENCOUNTER — Ambulatory Visit (INDEPENDENT_AMBULATORY_CARE_PROVIDER_SITE_OTHER): Payer: Medicare HMO | Admitting: Cardiovascular Disease

## 2018-08-26 VITALS — BP 152/82 | HR 72

## 2018-08-26 DIAGNOSIS — I10 Essential (primary) hypertension: Secondary | ICD-10-CM

## 2018-08-26 MED ORDER — POTASSIUM CHLORIDE CRYS ER 20 MEQ PO TBCR: 20.0000 meq | EXTENDED_RELEASE_TABLET | Freq: Every day | ORAL | 0 refills | Status: DC

## 2018-08-26 MED ORDER — FUROSEMIDE 40 MG PO TABS: 40.0000 mg | ORAL_TABLET | Freq: Every day | ORAL | 0 refills | Status: DC

## 2018-08-26 MED ORDER — CARVEDILOL 6.25 MG PO TABS: 6.2500 mg | ORAL_TABLET | Freq: Two times a day (BID) | ORAL | 0 refills | Status: DC

## 2018-08-26 NOTE — Progress Notes (Signed)
1.  Reason for visit: BP check  2.  Name of MD requesting visit:  Ross  3. H&P:  See chart  4.  ROS related to problem:  n/a  5.  Assessment and plan per MD:    Pt comes in today for follow up BP check after Dr. Harrington Challenger increased Amlodipine last month.  BP today is 152/82. Pt brought home recordings: 12/5    11am  173/86            12:45 pm 160/86     6pm  163/86 12/6      158/58 12/7    830am 159/84     4pm  160/83       150/82     730 pm 169/86 12/9   8am  175/83       157/76  12/10   160/83      152/79  Reviewed w/ DOD, Dr. [name].  Orders received:  Start Carvedilol 6.25 mg BID, Lasix 40 mg daily, KDur 20 mEq daily.  (I only sent in 30 day supply for the new medication, with no refills.  Will leave it to pt's primary cardiologist to reorder w/ refills.) Long term goal, with depressed EF would be to discontinue Amlodipine use.  Will leave stopping this medication to his primary cardiologist. Pt given BP tracking sheet.  Pt advised to continue to monitor  BP at home.  Also advised to record his HRs also. Pt made aware that Dr. Alan Ripper nurse will follow up with him about any recommendations she may have and to arrange follow up blood work after starting Lasix.     Mertie Moores, MD  08/26/2018 5:38 PM    [address] [place],  [place], [place]  [address] Pager [phone] Phone: [phone]; Fax: [phone]

## 2018-08-26 NOTE — Patient Instructions (Addendum)
Medication Instructions:  Your physician has recommended you make the following change in your medication:  1. START Carvedilol 6.25 mg twice a day 2. START Furosemide (Lasix) 40 mg once daily  - take this in the morning 3. START Potassium supplementation 20 mg once daily - take this in the morning  * If you need a refill on your cardiac medications before your next appointment, please call your pharmacy.   Labwork: None ordered  Testing/Procedures: None ordered  Follow-Up: To be determined   Any Other Special Instructions Will Be Listed Below (If Applicable). Keep writing down your blood pressures  Nurse will contact you soon to follow up on your blood pressure and let you know Dr. Alan Ripper recommendation on stopping  Amlodipine at some point.  For now, continue taking your Amlodipine

## 2018-09-17 ENCOUNTER — Other Ambulatory Visit: Payer: Self-pay | Admitting: Cardiovascular Disease

## 2018-09-24 ENCOUNTER — Telehealth: Payer: Self-pay | Admitting: Adult Health

## 2018-09-24 NOTE — Telephone Encounter (Signed)
Pt is wanting to know the update on getting his drivers license. Please advise.

## 2018-09-24 NOTE — Telephone Encounter (Signed)
After 10/04/2018, patient will be 6 months seizure-free.  I am unsure if he surrendered his driver's license for not or exactly what his question is.  I would advise him to wait until he sees me on 10/09/2018 prior to returning to driving.

## 2018-09-25 NOTE — Telephone Encounter (Signed)
Left vm for patient to call back about his driving question.

## 2018-09-29 NOTE — Telephone Encounter (Signed)
I call patient back that he has an appt on 10/09/2018 with Janett Billow NP. I stated per Janett Billow NP no driving until revaluated at next appt. Pt verbalized understanding.

## 2018-10-09 ENCOUNTER — Telehealth: Payer: Self-pay

## 2018-10-09 ENCOUNTER — Encounter: Payer: Self-pay | Admitting: Adult Health

## 2018-10-09 ENCOUNTER — Encounter

## 2018-10-09 ENCOUNTER — Ambulatory Visit: Payer: Medicare HMO | Admitting: Adult Health

## 2018-10-09 VITALS — BP 160/86 | HR 68 | Ht 72.0 in | Wt 207.0 lb

## 2018-10-09 DIAGNOSIS — E119 Type 2 diabetes mellitus without complications: Secondary | ICD-10-CM | POA: Diagnosis not present

## 2018-10-09 DIAGNOSIS — I10 Essential (primary) hypertension: Secondary | ICD-10-CM | POA: Diagnosis not present

## 2018-10-09 DIAGNOSIS — I639 Cerebral infarction, unspecified: Secondary | ICD-10-CM

## 2018-10-09 DIAGNOSIS — I63522 Cerebral infarction due to unspecified occlusion or stenosis of left anterior cerebral artery: Secondary | ICD-10-CM

## 2018-10-09 DIAGNOSIS — E785 Hyperlipidemia, unspecified: Secondary | ICD-10-CM

## 2018-10-09 NOTE — Telephone Encounter (Signed)
Disability Parking Placard done by Janett Billow NP for pt. PT was given the total and permanent disability parking placard issues for 5 days that does not require medical provider re certification upon renewal.

## 2018-10-09 NOTE — Patient Instructions (Addendum)
Continue aspirin 81 mg daily and Eliquis (apixaban) daily  and lipitor  for secondary stroke prevention  Check cholesterol panel today to ensure adequate management of levels with lipitor  Please schedule appt with Dr. Harrington Challenger to follow up as recommended by Dr. [name]  [phone]  You can return to driving at this time as it has been over 6 months since any seizure activity  Handicap parking letter provided   Continue to follow up with PCP regarding cholesterol and blood pressure management   Schedule appt with endocrinologist for diabetic management   Continue to monitor blood pressure at home  Maintain strict control of hypertension with blood pressure goal below 130/90, diabetes with hemoglobin A1c goal below 6.5% and cholesterol with LDL cholesterol (bad cholesterol) goal below 70 mg/dL. I also advised the patient to eat a healthy diet with plenty of whole grains, cereals, fruits and vegetables, exercise regularly and maintain ideal body weight.  Followup in the future with me in 6 months or call earlier if needed       Thank you for coming to see [place] at [facility]. I hope we have been able to provide you high quality care today.  You may receive a patient satisfaction survey over the next few weeks. We would appreciate your feedback and comments so that we may continue to improve ourselves and the health of our patients.

## 2018-10-09 NOTE — Progress Notes (Signed)
[facility] [address] [place]. [place] [address] [phone]       OFFICE FOLLOW UP NOTE  Mr. [name] Date of Birth:  1936-05-21 Medical Record Number:  [id number]   Reason for Referral:  seizure activity and stroke follow up  CHIEF COMPLAINT:  Chief Complaint  Patient presents with  . Follow-up    Stroke follow up room in back hallway pt with [name] his brother Pt wants to drive and be evaluated by provider    HPI:  Brian [name] is a 83 year old male with PMH of BPH, DM, HDL, HTN, atrial fibrillation on Eliquis, intracranial stenosis and strokes.  He is being followed in this office due to recent strokes.  On 04/01/2018, patient presented with left arm stiffness, gaze deviation, garbled speech and drooling concerning for seizure activity.  EEG negative and imaging showed right occipital infarct and chronic right cerebellar infarct secondary to small vessel disease versus embolic source.  As seizure activity first event and not related to stroke with EEG negative, no AEDs initiated at that time and advised not to drive for 6 months.  Imaging showed evidence of left M1 70% stenosis.  He was discharged on DAPT and continuation of atorvastatin with recommendation of 30-day cardiac event monitor to rule out atrial fibrillation.  He returned to ED on 06/22/2018 with right-sided weakness, slurred speech and facial droop and was found to have left CR infarct secondary to small vessel disease.  During the interval time, patient did complete 30-day cardiac event monitor which showed evidence of PAF and was started on Eliquis.  Was also recommended to initiate aspirin 81 mg along with continuation of atorvastatin 80 mg daily for secondary stroke prevention and evidence of intracranial stenosis.  He did have follow-up appointment on 08/11/2018 in this office but due to symptomatic hypotension and tachycardia, patient was transferred to ED via EMS.  He continues to follow with Dr.  Cathie Olden for HTN management and Dr. Harrington Challenger for atrial fibrillation management.   Patient is being seen today for follow-up visit as he was transferred to ED via EMS after prior appointment due to hypotension and tachycardia.  Upon arrival to ED, blood pressure and heart rate normalized.  He was followed by cardiology Dr. [name] on 08/26/2018 with initiating carvedilol, furosemide and potassium supplementation.  Was also recommended to discontinue amlodipine due to depressed EF but advised to follow-up with primary cardiologist prior to discontinuation.  Blood pressure today 160/86. He does monitor at home and typically ranges 120-140s/60-70s. He continues on Eliquis and aspirin 81 mg without side effects of bleeding or bruising.  Continues on atorvastatin 80 mg without side effects myalgias.  Lipid panel has not been obtained since hospital admission on 06/23/2018.  Glucose levels have been stable.  He denies any recurrent seizure activity and is eager to return to driving as it has been greater than 6 months time.  He continues to do well from a stroke standpoint without residual deficits or recurring of symptoms.      ROS:   14 system review of systems performed and negative with exception of no complaints  PMH:  Past Medical History:  Diagnosis Date  . ALLERGIC RHINITIS 10/13/2007  . ANEMIA, CHRONIC DISEASE NEC 03/31/2007  . BENIGN PROSTATIC HYPERTROPHY 03/31/2007  . Chest pain    Stress echo, normal, December, 2012  . Chronic anemia   . DIABETES MELLITUS, TYPE II 03/31/2007  . [place] DISEASE, LUMBAR 03/31/2007  . ED (erectile dysfunction)   .  Ejection fraction    EF  normal, stress echo, December,  2012  . History of prostatitis   . HYPERLIPIDEMIA 03/31/2007  . HYPERTENSION 03/31/2007  . Nephrolithiasis 12/07/2010  . Obesity   . POLYP, ANAL AND RECTAL 03/31/2007  . Right eye trauma    hx as child  . Stroke [facility])     PSH:  Past Surgical History:  Procedure Laterality Date  . [place]  SURGERY     1999  . PROSTATE BIOPSY     s/p  . RECTAL POLYPECTOMY     Transanal excision 10/2005  . SKIN BIOPSY     s/p right upper back 2009- benign Dr. Tonia Brooms    Social History:  Social History   Socioeconomic History  . Marital status: Widowed    Spouse name: Not on file  . Number of children: 2  . Years of education: Not on file  . Highest education level: Not on file  Occupational History  . Occupation: Acupuncturist    Comment: Retired 1999  Social Needs  . Financial resource strain: Not on file  . Food insecurity:    Worry: Not on file    Inability: Not on file  . Transportation needs:    Medical: Not on file    Non-medical: Not on file  Tobacco Use  . Smoking status: Former Smoker    Years: 10.00    Last attempt to quit: 1995    Years since quitting: 25.0  . Smokeless tobacco: Never Used  Substance and Sexual Activity  . Alcohol use: Yes    Alcohol/week: 1.0 standard drinks    Types: 1 Cans of beer per week    Comment: occasional or yearly  . Drug use: No  . Sexual activity: Not on file  Lifestyle  . Physical activity:    Days per week: Not on file    Minutes per session: Not on file  . Stress: Not on file  Relationships  . Social connections:    Talks on phone: Not on file    Gets together: Not on file    Attends religious service: Not on file    Active member of club or organization: Not on file    Attends meetings of clubs or organizations: Not on file    Relationship status: Not on file  . Intimate partner violence:    Fear of current or ex partner: Not on file    Emotionally abused: Not on file    Physically abused: Not on file    Forced sexual activity: Not on file  Other Topics Concern  . Not on file  Social History Narrative  . Not on file    Family History:  Family History  Problem Relation Age of Onset  . Colon polyps Mother   . Stroke Mother        due to ICA stenosis  . CVA Neg Hx   . Seizures Neg Hx      Medications:   Current Outpatient Medications on File Prior to Visit  Medication Sig Dispense Refill  . ACCU-CHEK SOFTCLIX LANCETS lancets Use as instructed twice per day Dx Code E11.9 200 each 3  . acetaminophen (TYLENOL) 325 MG tablet Take 2 tablets (650 mg total) by mouth every 4 (four) hours as needed for mild pain or fever (or temp > 37.5 C (99.5 F)). 20 tablet 1  . amLODipine (NORVASC) 10 MG tablet Take 1 tablet (10 mg total) by mouth daily. (Patient taking  differently: Take 20 mg by mouth daily. ) 90 tablet 3  . apixaban (ELIQUIS) 5 MG TABS tablet Take 1 tablet (5 mg total) by mouth 2 (two) times daily. (Patient taking differently: Take 5 mg by mouth 2 (two) times daily with a meal. 8am and 6pm) 60 tablet 6  . aspirin EC 81 MG EC tablet Take 1 tablet (81 mg total) by mouth daily. With breakfast 30 tablet 2  . atorvastatin (LIPITOR) 80 MG tablet Take 1 tablet (80 mg total) by mouth daily at 6 PM. 90 tablet 1  . carvedilol (COREG) 6.25 MG tablet TAKE 1 TABLET BY MOUTH TWICE A DAY 60 tablet 11  . furosemide (LASIX) 40 MG tablet TAKE 1 TABLET BY MOUTH EVERY DAY 30 tablet 11  . gabapentin (NEURONTIN) 100 MG capsule Take 1 capsule (100 mg total) by mouth 3 (three) times daily. 90 capsule 2  . glucose blood (ACCU-CHEK AVIVA) test strip Use as instructed twice per day Dx E11.9 200 each 3  . JANUVIA 100 MG tablet TAKE 1 TABLET BY MOUTH EVERY DAY 90 tablet 3  . losartan (COZAAR) 100 MG tablet Take 1 tablet (100 mg total) by mouth daily. 90 tablet 1  . metFORMIN (GLUMETZA) 1000 MG (MOD) 24 hr tablet Take 1 tablet (1,000 mg total) by mouth 2 (two) times daily before a meal. 60 tablet 1  . OVER THE COUNTER MEDICATION Place 1 drop into both eyes daily as needed (dry eyes). Over the counter lubricating eye drop    . Potassium Chloride ER 20 MEQ TBCR Take 1 tablet by mouth daily. Please call and schedule appt for further refills 1st attempt 30 tablet 1   No current facility-administered medications  on file prior to visit.     Allergies:  No Known Allergies   Physical Exam  Vitals:   10/09/18 1247  BP: (!) 160/86  Pulse: 68  Weight: 207 lb (93.9 kg)  Height: 6' (1.829 m)   Body mass index is 28.07 kg/m. No exam data present  General: well developed, well nourished, pleasant elderly [place] male, seated, in no evident distress Head: head normocephalic and atraumatic.   Neck: supple with no carotid or supraclavicular bruits Cardiovascular:  regular rate and rhythm, no murmurs  Neurologic Exam Mental Status: Awake and fully alert. Oriented to place and time. Recent and remote memory intact. Attention span, concentration and fund of knowledge appropriate. Mood and affect appropriate.  Cranial Nerves: Fundoscopic exam reveals sharp disc margins. Pupils equal, briskly reactive to light. Extraocular movements full without nystagmus. Visual fields full to confrontation. Hearing intact. Facial sensation intact. Face, tongue, palate moves normally and symmetrically.  Motor: Normal bulk and tone. Normal strength in all tested extremity muscles except for mild left ankle dorsiflexion weakness Sensory.: intact to touch , pinprick , position and vibratory sensation.  Coordination: Rapid alternating movements normal in all extremities. Finger-to-nose and heel-to-shin performed accurately bilaterally. Gait and Station: Arises from chair without difficulty. Stance is normal. Gait demonstrates normal stride length and balance with assistance of cane. Reflexes: 1+ and symmetric. Toes downgoing.      Diagnostic Data (Labs, Imaging, Testing)  CT head without contrast 04/01/2018 IMPRESSION: 1. 5 mm acute ischemic nonhemorrhagic subcortical infarct at the right parieto-occipital region, posterior right MCA distribution. 2. Age-related cerebral atrophy with moderate chronic small vessel ischemic disease.  CTA head/neck with and without contrast 04/01/2018 IMPRESSION: Mild  atherosclerotic change at both carotid bifurcations. Slight irregularity of the ICA bulb on the right, but  without measurable stenosis. Intracranial medium vessel atherosclerotic change. 70% stenosis of the distal M1 segment on the left. I do not identify any occluded branches. 50% stenosis of the right vertebral artery origin.  MRI brain without contrast 04/01/2018 IMPRESSION: 1. 5 mm acute ischemic nonhemorrhagic subcortical infarct at the right parieto-occipital region, posterior right MCA distribution. 2. Age-related cerebral atrophy with moderate chronic small vessel ischemic disease.  CT HEAD WO CONTRAST 06/22/2018 IMPRESSION: 1. No acute intracranial abnormality. 2. Old right cerebellar infarct. 3. Chronic microvascular white matter disease  MR BRAIN WO CONTRAST 06/23/2018 IMPRESSION: 1. 1 cm acute infarct in the left corona radiata. 2. Chronic small vessel ischemia in the cerebral white matter.     ASSESSMENT: Brian [name] is a 83 y.o. year old male here with right parietal-occipital region infarct on 04/01/2018 secondary to small vessel disease versus cardioembolic source along with seizure activity. Vascular risk factors include DM, HTN and HLD.  30-day cardiac monitor showed evidence of atrial fibrillation and was placed on Eliquis.  On 06/22/2018, patient was found to have left CR infarct secondary to small vessel disease.  It was recommended to continue statin, Eliquis and aspirin for secondary stroke prevention.  Patient returns today for follow-up visit and overall continues to do well from a stroke standpoint with only residual deficit of mild left ankle dorsiflexion weakness.     PLAN: 1. Right parietal occipital region infarct and left CR infarct: Continue aspirin 81 mg daily and Eliquis (apixaban) daily  and atorvastatin for secondary stroke prevention. Maintain strict control of hypertension with blood pressure goal 130-150, diabetes with hemoglobin A1c goal below  6.5% and cholesterol with LDL cholesterol (bad cholesterol) goal below 70 mg/dL.  I also advised the patient to eat a healthy diet with plenty of whole grains, cereals, fruits and vegetables, exercise regularly with at least 30 minutes of continuous activity daily and maintain ideal body weight. 2. Atrial fibrillation: Continue to follow with Dr. Harrington Challenger for continued management along with continuation of Eliquis 3. Intracranial stenosis: Maintain BP 1 30-1 50 along with ensuring management of HTN, HLD and DM along with continued use of aspirin 81 mg daily and statin 4. HTN: Advised to continue current treatment regimen.  Today's BP 160/86.  Advised to continue to monitor at home along with continued follow-up with PCP for management 5. HLD: Advised to continue current treatment regimen along with continued follow-up with PCP for future prescribing and monitoring of lipid panel.  Lipid panel obtained today as this has not been completed since hospital admission 6. DMII: Advised to continue to monitor glucose levels at home along with continued follow-up with PCP for management and monitoring  7. Patient cleared to return to driving at this time as he has been seizure-free for 6 months.  Due to left ankle dorsiflexion weakness, he uses a cane for ambulation and does have difficulty with ambulating long distances therefore handicap parking permit provided  Follow-up in 6 months or call earlier if needed   Greater than 50% of time during this 25 minute visit was spent on counseling,explanation of diagnosis of right parietal occipital region infarct and left CR infarct, reviewing risk factor management of atrial fibrillation, intracranial stenosis, HTN, HLD and DM, planning of further management, discussion with patient and family and coordination of care     Venancio Poisson, [facility]  [facility] [address] [place], [place] [address]  Phone [phone] Fax  [phone]

## 2018-10-10 ENCOUNTER — Telehealth: Payer: Self-pay

## 2018-10-10 LAB — LIPID PANEL
CHOL/HDL RATIO: 2.5 ratio (ref 0.0–5.0)
CHOLESTEROL TOTAL: 141 mg/dL (ref 100–199)
HDL: 57 mg/dL (ref >39)
LDL CALC: 69 mg/dL (ref 0–99)
TRIGLYCERIDES: 75 mg/dL (ref 0–149)
VLDL Cholesterol Cal: 15 mg/dL (ref 5–40)

## 2018-10-10 NOTE — Telephone Encounter (Signed)
-----   Message from Venancio Poisson, NP sent at 10/10/2018  7:06 AM EST ----- Please advise patient that his recent cholesterol panel looked well with LDL 69 with goal less than 70.  Please advise him to continue current regimen.

## 2018-10-10 NOTE — Telephone Encounter (Signed)
Unable to get in contact with the patient. I left a detailed message with his lab results( DPR verified). Office number was provided in case he has any further questions.

## 2018-10-14 ENCOUNTER — Other Ambulatory Visit: Payer: Self-pay | Admitting: Cardiovascular Disease

## 2018-10-14 NOTE — Progress Notes (Signed)
I agree with the above plan 

## 2019-01-16 ENCOUNTER — Other Ambulatory Visit: Payer: Self-pay | Admitting: Internal Medicine

## 2019-01-28 DIAGNOSIS — Z20828 Contact with and (suspected) exposure to other viral communicable diseases: Secondary | ICD-10-CM | POA: Diagnosis not present

## 2019-01-29 ENCOUNTER — Ambulatory Visit (INDEPENDENT_AMBULATORY_CARE_PROVIDER_SITE_OTHER): Payer: Medicare HMO | Admitting: Internal Medicine

## 2019-01-29 DIAGNOSIS — N528 Other male erectile dysfunction: Secondary | ICD-10-CM | POA: Diagnosis not present

## 2019-01-29 DIAGNOSIS — E1159 Type 2 diabetes mellitus with other circulatory complications: Secondary | ICD-10-CM

## 2019-01-29 DIAGNOSIS — Z0001 Encounter for general adult medical examination with abnormal findings: Secondary | ICD-10-CM

## 2019-01-29 DIAGNOSIS — IMO0002 Reserved for concepts with insufficient information to code with codable children: Secondary | ICD-10-CM

## 2019-01-29 DIAGNOSIS — I10 Essential (primary) hypertension: Secondary | ICD-10-CM | POA: Diagnosis not present

## 2019-01-29 DIAGNOSIS — G4700 Insomnia, unspecified: Secondary | ICD-10-CM | POA: Diagnosis not present

## 2019-01-29 DIAGNOSIS — E1165 Type 2 diabetes mellitus with hyperglycemia: Secondary | ICD-10-CM | POA: Diagnosis not present

## 2019-01-29 DIAGNOSIS — N521 Erectile dysfunction due to diseases classified elsewhere: Secondary | ICD-10-CM | POA: Diagnosis not present

## 2019-01-29 MED ORDER — TADALAFIL 20 MG PO TABS: 20.0000 mg | ORAL_TABLET | Freq: Every day | ORAL | 11 refills | Status: DC | PRN

## 2019-01-29 NOTE — Patient Instructions (Signed)
Please take all new medication as prescribed - the cialis 20 mg as needed  Ok to try the Melatonin at night for sleep, and call if this does not help  Please continue all other medications as before, and refills have been done if requested.  Please have the pharmacy call with any other refills you may need.  Please continue your efforts at being more active, low cholesterol diet, and weight control.  You are otherwise up to date with prevention measures today.  Please keep your appointments with your specialists as you may have planned  Please go to the LAB in the Basement (turn left off the elevator) for the tests to be done today  You will be contacted by phone if any changes need to be made immediately.  Otherwise, you will receive a letter about your results with an explanation, but please check with MyChart first.  Please remember to sign up for MyChart if you have not done so, as this will be important to you in the future with finding out test results, communicating by private email, and scheduling acute appointments online when needed.  Please return in 6 months, or sooner if needed, with Lab testing done 3-5 days before

## 2019-01-29 NOTE — Progress Notes (Signed)
Patient ID: Brian [name], male   DOB: Dec 30, 1935, 83 y.o.   MRN: [id number]  Virtual Visit via Video Note  I connected with Brian [name] on 01/29/19 at  1:40 PM EDT by a video enabled telemedicine application and verified that I am speaking with the correct person using two identifiers.  Location: Patient: at home Provider: at office   I discussed the limitations of evaluation and management by telemedicine and the availability of in person appointments. The patient expressed understanding and agreed to proceed.  History of Present Illness: Here for wellness and f/u;  Overall doing ok;  Pt denies Chest pain, worsening SOB, DOE, wheezing, orthopnea, PND, worsening LE edema, palpitations, dizziness or syncope.  Pt denies neurological change such as new headache, facial or extremity weakness.  Pt denies polydipsia, polyuria, or low sugar symptoms. Pt states overall good compliance with treatment and medications, good tolerability, and has been trying to follow appropriate diet. No fever, night sweats, wt loss, loss of appetite, or other constitutional symptoms.  Pt states good ability with ADL's, has low fall risk, home safety reviewed and adequate, no other significant changes in hearing or vision, and only occasionally active with exercise. Also with worsening insomnia x  1 mo with difficulty getting to sleep, then waking up at 2 am and unable to get back to sleep.  Denies worsening depressive symptoms, suicidal ideation, or panic.  Also with c/o worsening ED symptoms x 6 mo, cant maintain and asking for cialis type med. Past Medical History:  Diagnosis Date  . ALLERGIC RHINITIS 10/13/2007  . ANEMIA, CHRONIC DISEASE NEC 03/31/2007  . BENIGN PROSTATIC HYPERTROPHY 03/31/2007  . Chest pain    Stress echo, normal, December, 2012  . Chronic anemia   . DIABETES MELLITUS, TYPE II 03/31/2007  . [place] DISEASE, LUMBAR 03/31/2007  . ED (erectile dysfunction)   . Ejection fraction    EF  normal, stress echo,  December,  2012  . History of prostatitis   . HYPERLIPIDEMIA 03/31/2007  . HYPERTENSION 03/31/2007  . Nephrolithiasis 12/07/2010  . Obesity   . POLYP, ANAL AND RECTAL 03/31/2007  . Right eye trauma    hx as child  . Stroke [facility]    Past Surgical History:  Procedure Laterality Date  . [place] SURGERY     1999  . PROSTATE BIOPSY     s/p  . RECTAL POLYPECTOMY     Transanal excision 10/2005  . SKIN BIOPSY     s/p right upper back 2009- benign Dr. Tonia Brooms    reports that he quit smoking about 25 years ago. He quit after 10.00 years of use. He has never used smokeless tobacco. He reports current alcohol use of about 1.0 standard drinks of alcohol per week. He reports that he does not use drugs. family history includes Colon polyps in his mother; Stroke in his mother. No Known Allergies Current Outpatient Medications on File Prior to Visit  Medication Sig Dispense Refill  . ACCU-CHEK SOFTCLIX LANCETS lancets Use as instructed twice per day Dx Code E11.9 200 each 3  . acetaminophen (TYLENOL) 325 MG tablet Take 2 tablets (650 mg total) by mouth every 4 (four) hours as needed for mild pain or fever (or temp > 37.5 C (99.5 F)). 20 tablet 1  . amLODipine (NORVASC) 10 MG tablet Take 1 tablet (10 mg total) by mouth daily. (Patient taking differently: Take 20 mg by mouth daily. ) 90 tablet 3  . apixaban (ELIQUIS) 5 MG TABS  tablet Take 1 tablet (5 mg total) by mouth 2 (two) times daily. (Patient taking differently: Take 5 mg by mouth 2 (two) times daily with a meal. 8am and 6pm) 60 tablet 6  . aspirin EC 81 MG EC tablet Take 1 tablet (81 mg total) by mouth daily. With breakfast 30 tablet 2  . atorvastatin (LIPITOR) 80 MG tablet Take 1 tablet (80 mg total) by mouth daily at 6 PM. 90 tablet 1  . carvedilol (COREG) 6.25 MG tablet TAKE 1 TABLET BY MOUTH TWICE A DAY 60 tablet 11  . furosemide (LASIX) 40 MG tablet TAKE 1 TABLET BY MOUTH EVERY DAY 30 tablet 11  . gabapentin (NEURONTIN) 100 MG capsule Take  1 capsule (100 mg total) by mouth 3 (three) times daily. 90 capsule 2  . glucose blood (ACCU-CHEK AVIVA) test strip Use as instructed twice per day Dx E11.9 200 each 3  . JANUVIA 100 MG tablet TAKE 1 TABLET BY MOUTH EVERY DAY 90 tablet 3  . losartan (COZAAR) 100 MG tablet TAKE 1 TABLET BY MOUTH EVERY DAY 90 tablet 0  . metFORMIN (GLUMETZA) 1000 MG (MOD) 24 hr tablet Take 1 tablet (1,000 mg total) by mouth 2 (two) times daily before a meal. 60 tablet 1  . OVER THE COUNTER MEDICATION Place 1 drop into both eyes daily as needed (dry eyes). Over the counter lubricating eye drop    . Potassium Chloride ER 20 MEQ TBCR TAKE 1 TABLET BY MOUTH DAILY. PLEASE CALL AND SCHEDULE APPT FOR FURTHER REFILLS 1ST ATTEMPT 90 tablet 3   No current facility-administered medications on file prior to visit.     Observations/Objective: Alert, NAD, appropriate mood and affect, resps normal, cn 2-12 intact, moves all 4s, no visible rash or swelling Lab Results  Component Value Date   WBC 4.7 06/22/2018   HGB 13.6 08/11/2018   HCT 40.0 08/11/2018   PLT 187 06/22/2018   GLUCOSE 222 (H) 08/11/2018   CHOL 141 10/09/2018   TRIG 75 10/09/2018   HDL 57 10/09/2018   LDLCALC 69 10/09/2018   ALT 21 06/22/2018   AST 32 06/22/2018   NA 138 08/11/2018   K 4.1 08/11/2018   CL 99 08/11/2018   CREATININE 1.20 08/11/2018   BUN 17 08/11/2018   CO2 25 06/22/2018   TSH 1.62 07/18/2017   PSA 2.64 07/18/2017   INR 1.19 06/22/2018   HGBA1C 6.1 (H) 06/23/2018   MICROALBUR 1.7 07/18/2017   Assessment and Plan: See notes  Follow Up Instructions: See notes   I discussed the assessment and treatment plan with the patient. The patient was provided an opportunity to ask questions and all were answered. The patient agreed with the plan and demonstrated an understanding of the instructions.   The patient was advised to call back or seek an in-person evaluation if the symptoms worsen or if the condition fails to improve as  anticipated.  Cathlean Cower, MD

## 2019-01-31 ENCOUNTER — Encounter: Payer: Self-pay | Admitting: Internal Medicine

## 2019-01-31 DIAGNOSIS — G4700 Insomnia, unspecified: Secondary | ICD-10-CM | POA: Insufficient documentation

## 2019-01-31 NOTE — Assessment & Plan Note (Addendum)
On d/w pt, pt states he will try OTC melatonin first, and maybe consider trazodone or ambien prn  In addition to the time spent performing CPE, I spent an additional 25 minutes face to face,in which greater than 50% of this time was spent in counseling and coordination of care for patient's acute illness as documented, including the differential dx, treatment, further evaluation and other management of insomnia, ED, HTN, DM

## 2019-01-31 NOTE — Assessment & Plan Note (Signed)
stable overall by history and exam, recent data reviewed with pt, and pt to continue medical treatment as before,  to f/u any worsening symptoms or concerns  

## 2019-01-31 NOTE — Assessment & Plan Note (Signed)
Ok for cialis prn,  to f/u any worsening symptoms or concerns  

## 2019-01-31 NOTE — Assessment & Plan Note (Signed)

## 2019-01-31 NOTE — Assessment & Plan Note (Signed)
Encouraged to cont to monitor BP at home, with goal < 140/90

## 2019-02-02 ENCOUNTER — Other Ambulatory Visit (INDEPENDENT_AMBULATORY_CARE_PROVIDER_SITE_OTHER): Payer: Medicare HMO

## 2019-02-02 ENCOUNTER — Telehealth: Payer: Self-pay | Admitting: Internal Medicine

## 2019-02-02 DIAGNOSIS — IMO0002 Reserved for concepts with insufficient information to code with codable children: Secondary | ICD-10-CM

## 2019-02-02 DIAGNOSIS — E1159 Type 2 diabetes mellitus with other circulatory complications: Secondary | ICD-10-CM

## 2019-02-02 DIAGNOSIS — Z0001 Encounter for general adult medical examination with abnormal findings: Secondary | ICD-10-CM | POA: Diagnosis not present

## 2019-02-02 DIAGNOSIS — N521 Erectile dysfunction due to diseases classified elsewhere: Secondary | ICD-10-CM

## 2019-02-02 DIAGNOSIS — E1165 Type 2 diabetes mellitus with hyperglycemia: Secondary | ICD-10-CM | POA: Diagnosis not present

## 2019-02-02 LAB — LIPID PANEL
Cholesterol: 118 mg/dL (ref 0–200)
HDL: 48.8 mg/dL (ref >39.00)
LDL Cholesterol: 54 mg/dL (ref 0–99)
NonHDL: 68.81
Total CHOL/HDL Ratio: 2
Triglycerides: 74 mg/dL (ref 0.0–149.0)
VLDL: 14.8 mg/dL (ref 0.0–40.0)

## 2019-02-02 LAB — TSH: TSH: 1.78 u[IU]/mL (ref 0.35–4.50)

## 2019-02-02 LAB — HEPATIC FUNCTION PANEL
ALT: 16 U/L (ref 0–53)
AST: 13 U/L (ref 0–37)
Albumin: 4.6 g/dL (ref 3.5–5.2)
Alkaline Phosphatase: 71 U/L (ref 39–117)
Bilirubin, Direct: 0.2 mg/dL (ref 0.0–0.3)
Total Bilirubin: 0.9 mg/dL (ref 0.2–1.2)
Total Protein: 7.5 g/dL (ref 6.0–8.3)

## 2019-02-02 LAB — CBC WITH DIFFERENTIAL/PLATELET
Basophils Absolute: 0 10*3/uL (ref 0.0–0.1)
Basophils Relative: 0.9 % (ref 0.0–3.0)
Eosinophils Absolute: 0.1 10*3/uL (ref 0.0–0.7)
Eosinophils Relative: 2.1 % (ref 0.0–5.0)
HCT: 36.2 % — ABNORMAL LOW (ref 39.0–52.0)
Hemoglobin: 12 g/dL — ABNORMAL LOW (ref 13.0–17.0)
Lymphocytes Relative: 25.3 % (ref 12.0–46.0)
Lymphs Abs: 1.2 10*3/uL (ref 0.7–4.0)
MCHC: 33 g/dL (ref 30.0–36.0)
MCV: 81.7 fl (ref 78.0–100.0)
Monocytes Absolute: 0.4 10*3/uL (ref 0.1–1.0)
Monocytes Relative: 9.7 % (ref 3.0–12.0)
Neutro Abs: 2.8 10*3/uL (ref 1.4–7.7)
Neutrophils Relative %: 62 % (ref 43.0–77.0)
Platelets: 187 10*3/uL (ref 150.0–400.0)
RBC: 4.43 Mil/uL (ref 4.22–5.81)
RDW: 13.8 % (ref 11.5–15.5)
WBC: 4.6 10*3/uL (ref 4.0–10.5)

## 2019-02-02 LAB — URINALYSIS, ROUTINE W REFLEX MICROSCOPIC
Bilirubin Urine: NEGATIVE
Ketones, ur: NEGATIVE
Leukocytes,Ua: NEGATIVE
Nitrite: NEGATIVE
Specific Gravity, Urine: 1.01 (ref 1.000–1.030)
Total Protein, Urine: NEGATIVE
Urine Glucose: >=1000 — AB
Urobilinogen, UA: 1 (ref 0.0–1.0)
pH: 6.5 (ref 5.0–8.0)

## 2019-02-02 LAB — BASIC METABOLIC PANEL
BUN: 13 mg/dL (ref 6–23)
CO2: 31 mEq/L (ref 19–32)
Calcium: 9.8 mg/dL (ref 8.4–10.5)
Chloride: 99 mEq/L (ref 96–112)
Creatinine, Ser: 1.04 mg/dL (ref 0.40–1.50)
GFR: 82.91 mL/min (ref >60.00)
Glucose, Bld: 420 mg/dL — ABNORMAL HIGH (ref 70–99)
Potassium: 4.4 mEq/L (ref 3.5–5.1)
Sodium: 137 mEq/L (ref 135–145)

## 2019-02-02 LAB — MICROALBUMIN / CREATININE URINE RATIO
Creatinine,U: 48.3 mg/dL
Microalb Creat Ratio: 1.4 mg/g (ref 0.0–30.0)
Microalb, Ur: <0.7 mg/dL (ref 0.0–1.9)

## 2019-02-02 LAB — HEMOGLOBIN A1C: Hgb A1c MFr Bld: 11.6 % — ABNORMAL HIGH (ref 4.6–6.5)

## 2019-02-02 NOTE — Telephone Encounter (Signed)
I called pt- he was requesting the phone # for Dr. Dorris Carnes' office. I gave him the cardiology [facility] office phone #.

## 2019-02-02 NOTE — Telephone Encounter (Signed)
Copied from [place] [id number]. Topic: Quick Communication - See Telephone Encounter >> Feb 02, 2019  1:52 PM [name] wrote: CRM for notification. See Telephone encounter for: 02/02/19. Pt called back says he can not get a hold of Dr Harrington Challenger.  Dr Jenny Reichmann has referred him to see her.  He does not know what kind of Dr she is. Pt is asking for contact info for this dr.  Please call [phone]

## 2019-02-04 ENCOUNTER — Encounter: Payer: Self-pay | Admitting: Internal Medicine

## 2019-02-05 ENCOUNTER — Telehealth: Payer: Self-pay

## 2019-02-05 DIAGNOSIS — H40013 Open angle with borderline findings, low risk, bilateral: Secondary | ICD-10-CM | POA: Diagnosis not present

## 2019-02-05 NOTE — Telephone Encounter (Signed)

## 2019-02-06 ENCOUNTER — Other Ambulatory Visit: Payer: Self-pay

## 2019-02-06 ENCOUNTER — Encounter: Payer: Self-pay | Admitting: Internal Medicine

## 2019-02-06 ENCOUNTER — Telehealth (INDEPENDENT_AMBULATORY_CARE_PROVIDER_SITE_OTHER): Payer: Medicare HMO | Admitting: Internal Medicine

## 2019-02-06 VITALS — BP 163/91 | HR 73 | Ht 72.0 in | Wt 200.0 lb

## 2019-02-06 DIAGNOSIS — E1159 Type 2 diabetes mellitus with other circulatory complications: Secondary | ICD-10-CM

## 2019-02-06 DIAGNOSIS — N521 Erectile dysfunction due to diseases classified elsewhere: Secondary | ICD-10-CM | POA: Diagnosis not present

## 2019-02-06 DIAGNOSIS — E1165 Type 2 diabetes mellitus with hyperglycemia: Secondary | ICD-10-CM | POA: Diagnosis not present

## 2019-02-06 DIAGNOSIS — I480 Paroxysmal atrial fibrillation: Secondary | ICD-10-CM

## 2019-02-06 DIAGNOSIS — IMO0002 Reserved for concepts with insufficient information to code with codable children: Secondary | ICD-10-CM

## 2019-02-06 DIAGNOSIS — E782 Mixed hyperlipidemia: Secondary | ICD-10-CM

## 2019-02-06 DIAGNOSIS — I10 Essential (primary) hypertension: Secondary | ICD-10-CM | POA: Diagnosis not present

## 2019-02-06 MED ORDER — FUROSEMIDE 20 MG PO TABS: 20.0000 mg | ORAL_TABLET | Freq: Every day | ORAL | 3 refills | Status: DC

## 2019-02-06 MED ORDER — POTASSIUM CHLORIDE CRYS ER 20 MEQ PO TBCR: 20.0000 meq | EXTENDED_RELEASE_TABLET | ORAL | 3 refills | Status: DC

## 2019-02-06 NOTE — Patient Instructions (Addendum)
Medication Instructions:  Your physician has recommended you make the following change in your medication:  1.) decrease furosemide to 20 mg daily 2.) decrease potassium chloride to 20 meq TWICE A WEEK If you need a refill on your cardiac medications before your next appointment, please call your pharmacy.   Lab work: none If you have labs (blood work) drawn today and your tests are completely normal, you will receive your results only by: [name] MyChart Message (if you have MyChart) OR . A paper copy in the mail If you have any lab test that is abnormal or we need to change your treatment, we will call you to review the results.  Testing/Procedures: none  Follow-Up: Dr. Harrington Challenger recommends a follow up PHONE visit in about 4 weeks.  We have scheduled this for June 25 at 10:00 am  Any Other Special Instructions Will Be Listed Below (If Applicable).

## 2019-02-06 NOTE — Progress Notes (Signed)
Virtual Visit via Telephone Note   This visit type was conducted due to national recommendations for restrictions regarding the COVID-19 Pandemic (e.g. social distancing) in an effort to limit this patient's exposure and mitigate transmission in our community.  Due to his co-morbid illnesses, this patient is at least at moderate risk for complications without adequate follow up.  This format is felt to be most appropriate for this patient at this time.  The patient did not have access to video technology/had technical difficulties with video requiring transitioning to audio format only (telephone).  All issues noted in this document were discussed and addressed.  No physical exam could be performed with this format.  Please refer to the patient's chart for his  consent to telehealth for [facility]   Date:  02/06/2019   ID:  Brian [name], DOB 1936-05-15, MRN [id number]  Patient Location: Home Provider Location: Home  PCP:  [name], MD  Cardiolo:  Harrington Challenger  Evaluation Performed:  Follow-Up Visit  Chief Complaint:  F/U of HTN and CVA  History of Present Illness:    Brian [name] is a 83 y.o. male with a history of DM, HTN, CVA (03/2018), PAF (CHADSVASC 6), CVA (06/2018)   I saw him in November 2019    The pt was seen in the ED in 07/2018 with tachycardia and  Hypotension noted in [place] office (seen for stroke f/u)  Sent to ED   BP was OK 148/88 F?U in clinic in Dec 2019 (P Nahser) for BP check  BP was high   Coreg , lasix, KCL started.    Pt says he is breathing OK   BP labile 120s to 150s/    Working to improve diet  No significant palpitations   No CP    The patient /does not have symptoms concerning for COVID-19 infection (fever, chills, cough, or new shortness of breath).    Past Medical History:  Diagnosis Date  . ALLERGIC RHINITIS 10/13/2007  . ANEMIA, CHRONIC DISEASE NEC 03/31/2007  . BENIGN PROSTATIC HYPERTROPHY 03/31/2007  . Chest pain    Stress echo, normal,  December, 2012  . Chronic anemia   . DIABETES MELLITUS, TYPE II 03/31/2007  . [place] DISEASE, LUMBAR 03/31/2007  . ED (erectile dysfunction)   . Ejection fraction    EF  normal, stress echo, December,  2012  . History of prostatitis   . HYPERLIPIDEMIA 03/31/2007  . HYPERTENSION 03/31/2007  . Nephrolithiasis 12/07/2010  . Obesity   . POLYP, ANAL AND RECTAL 03/31/2007  . Right eye trauma    hx as child  . Stroke [facility])    Past Surgical History:  Procedure Laterality Date  . [place] SURGERY     1999  . PROSTATE BIOPSY     s/p  . RECTAL POLYPECTOMY     Transanal excision 10/2005  . SKIN BIOPSY     s/p right upper back 2009- benign Dr. Tonia Brooms     Current Meds  Medication Sig  . ACCU-CHEK SOFTCLIX LANCETS lancets Use as instructed twice per day Dx Code E11.9  . acetaminophen (TYLENOL) 325 MG tablet Take 2 tablets (650 mg total) by mouth every 4 (four) hours as needed for mild pain or fever (or temp > 37.5 C (99.5 F)).  [name] amLODipine (NORVASC) 10 MG tablet Take 1 tablet (10 mg total) by mouth daily. (Patient taking differently: Take 20 mg by mouth daily. )  . apixaban (ELIQUIS) 5 MG TABS tablet Take 1  tablet (5 mg total) by mouth 2 (two) times daily. (Patient taking differently: Take 5 mg by mouth 2 (two) times daily with a meal. 8am and 6pm)  . aspirin EC 81 MG EC tablet Take 1 tablet (81 mg total) by mouth daily. With breakfast  . atorvastatin (LIPITOR) 80 MG tablet Take 1 tablet (80 mg total) by mouth daily at 6 PM.  . carvedilol (COREG) 6.25 MG tablet TAKE 1 TABLET BY MOUTH TWICE A DAY  . furosemide (LASIX) 40 MG tablet TAKE 1 TABLET BY MOUTH EVERY DAY  . gabapentin (NEURONTIN) 100 MG capsule Take 1 capsule (100 mg total) by mouth 3 (three) times daily.  [name] glucose blood (ACCU-CHEK AVIVA) test strip Use as instructed twice per day Dx E11.9  . JANUVIA 100 MG tablet TAKE 1 TABLET BY MOUTH EVERY DAY  . losartan (COZAAR) 100 MG tablet TAKE 1 TABLET BY MOUTH EVERY DAY  . metFORMIN  (GLUMETZA) 1000 MG (MOD) 24 hr tablet Take 1 tablet (1,000 mg total) by mouth 2 (two) times daily before a meal.  . OVER THE COUNTER MEDICATION Place 1 drop into both eyes daily as needed (dry eyes). Over the counter lubricating eye drop  . Potassium Chloride ER 20 MEQ TBCR TAKE 1 TABLET BY MOUTH DAILY. PLEASE CALL AND SCHEDULE APPT FOR FURTHER REFILLS 1ST ATTEMPT  . tadalafil (CIALIS) 20 MG tablet Take 1 tablet (20 mg total) by mouth daily as needed for up to 30 days for erectile dysfunction.     Allergies:   Patient has no known allergies.   Social History   Tobacco Use  . Smoking status: Former Smoker    Years: 10.00    Last attempt to quit: 1995    Years since quitting: 25.4  . Smokeless tobacco: Never Used  Substance Use Topics  . Alcohol use: Yes    Alcohol/week: 1.0 standard drinks    Types: 1 Cans of beer per week    Comment: occasional or yearly  . Drug use: No     Family Hx: The patient's family history includes Colon polyps in his mother; Stroke in his mother. There is no history of CVA or Seizures.  ROS:   Please see the history of present illness.     All other systems reviewed and are negative.   Prior CV studies:   The following studies were reviewed today: Left ventricle:  The cavity size was normal. There was moderate concentric hypertrophy. Systolic function was mildly reduced. The estimated ejection fraction was in the range of 45% to 50%. Regional wall motion abnormalities:  Mild global hypokinesis that is slightly worse in the inferolateral myocardium. Doppler parameters are consistent with abnormal left ventricular relaxation (grade 1 diastolic dysfunction). Doppler parameters are consistent with indeterminate ventricular filling pressure.  ------------------------------------------------------------------- Aortic valve:   Trileaflet; mildly thickened, mildly calcified leaflets. Mobility was not restricted.  Doppler:  Transvalvular velocity was  within the normal range. There was no stenosis. There was no regurgitation.  ------------------------------------------------------------------- Aorta:  Aortic root: The aortic root was normal in size.  ------------------------------------------------------------------- Mitral valve:   Structurally normal valve.   Mobility was not restricted.  Doppler:  Transvalvular velocity was within the normal range. There was no evidence for stenosis. There was no regurgitation.  ------------------------------------------------------------------- Left atrium:  The atrium was moderately dilated.  ------------------------------------------------------------------- Atrial septum:  No defect or patent foramen ovale was identified by color flow Doppler.  ------------------------------------------------------------------- Right ventricle:  The cavity size was normal. Wall thickness was  normal. Systolic function was normal.  ------------------------------------------------------------------- Pulmonic valve:    Structurally normal valve.   Cusp separation was normal.  Doppler:  Transvalvular velocity was within the normal range. There was no evidence for stenosis. There was no regurgitation.  ------------------------------------------------------------------- Tricuspid valve:   Structurally normal valve.    Doppler: Transvalvular velocity was within the normal range. There was trivial regurgitation.  ------------------------------------------------------------------- Pulmonary artery:   The main pulmonary artery was normal-sized. Systolic pressure was within the normal range.  ------------------------------------------------------------------- Right atrium:  The atrium was moderately dilated.  ------------------------------------------------------------------- Pericardium:  There was no pericardial effusion.  -------------------------------------------------------------------  Systemic veins: Inferior vena cava: The vessel was normal in size. The respirophasic diameter changes were blunted (< 50%), consistent with elevated central venous pressure.  ------------------------------------------------------------------- Measurements   Left ventricle                          Value        Reference  LV ID, ED, PLAX chordal                 46    mm     43 - 52  LV ID, ES, PLAX chordal                 36    mm     23 - 38  LV fx shortening, PLAX chordal  (L)     22    %      >=29  LV PW thickness, ED                     15    mm     ----------  IVS/LV PW ratio, ED                     1            <=1.3  Stroke volume, 2D                       81    ml     ----------  Stroke volume/bsa, 2D                   37    ml/m^2 ----------  LV e&', lateral                          5.66  cm/s   ----------  LV E/e&', lateral                        9.59         ----------  LV e&', medial                           4.9   cm/s   ----------  LV E/e&', medial                         11.08        ----------  LV e&', average                          5.28  cm/s   ----------  LV E/e&', average  10.28        ----------    Ventricular septum                      Value        Reference  IVS thickness, ED                       15    mm     ----------    LVOT                                    Value        Reference  LVOT ID, S                              24    mm     ----------  LVOT area                               4.52  cm^2   ----------  LVOT peak velocity, S                   84.2  cm/s   ----------  LVOT mean velocity, S                   54.8  cm/s   ----------  LVOT VTI, S                             17.9  cm     ----------  LVOT peak gradient, S                   3     mm Hg  ----------    Aorta                                   Value        Reference  Aortic root ID, ED                      37    mm     ----------    Left atrium                              Value        Reference  LA ID, A-P, ES                          32    mm     ----------  LA ID/bsa, A-P                          1.47  cm/m^2 <=2.2  LA volume, S                            66.3  ml     ----------  LA volume/bsa, S  30.5  ml/m^2 ----------  LA volume, ES, 1-p A4C                  72.5  ml     ----------  LA volume/bsa, ES, 1-p A4C              33.3  ml/m^2 ----------  LA volume, ES, 1-p A2C                  54.9  ml     ----------  LA volume/bsa, ES, 1-p A2C              25.2  ml/m^2 ----------    Mitral valve                            Value        Reference  Mitral E-wave peak velocity             54.3  cm/s   ----------  Mitral A-wave peak velocity             82.9  cm/s   ----------  Mitral deceleration time        (H)     278   ms     150 - 230  Mitral E/A ratio, peak                  0.7          ----------    Pulmonary arteries                      Value        Reference  PA pressure, S, DP              (H)     31    mm Hg  <=30    Tricuspid valve                         Value        Reference  Tricuspid regurg peak velocity          240   cm/s   ----------  Tricuspid peak RV-RA gradient           23    mm Hg  ----------    Right atrium                            Value        Reference  RA ID, S-I, ES, A4C             (H)     63.3  mm     34 - 49  RA area, ES, A4C                (H)     25.4  cm^2   8.3 - 19.5  RA volume, ES, A/L                      82.4  ml     ----------  RA volume/bsa, ES, A/L                  37.9  ml/m^2 ----------    Systemic veins  Value        Reference  Estimated CVP                           8     mm Hg  ----------    Right ventricle                         Value        Reference  RV ID, minor axis, ED, A4C base         34    mm     ----------  TAPSE                                   20.9  mm     ----------  RV pressure, S, DP              (H)     31    mm Hg  <=30  RV s&',  lateral, S                       14.8  cm/s   ----------  Legend: (L)  and  (H)  mark values outside specified reference range.  ------------------------------------------------------------------- Prepared and Electronically Authenticated by  Skeet Latch, MD 2019-07-17T12:53:42   Labs/Other Tests and Data Reviewed:    EKG:  No ECG reviewed.  Recent Labs: 02/02/2019: ALT 16; BUN 13; Creatinine, Ser 1.04; Hemoglobin 12.0; Platelets 187.0; Potassium 4.4; Sodium 137; TSH 1.78   Recent Lipid Panel Lab Results  Component Value Date/Time   CHOL 118 02/02/2019 11:42 AM   CHOL 141 10/09/2018 01:28 PM   TRIG 74.0 02/02/2019 11:42 AM   HDL 48.80 02/02/2019 11:42 AM   HDL 57 10/09/2018 01:28 PM   CHOLHDL 2 02/02/2019 11:42 AM   LDLCALC 54 02/02/2019 11:42 AM   LDLCALC 69 10/09/2018 01:28 PM    Wt Readings from Last 3 Encounters:  02/06/19 200 lb (90.7 kg)  10/09/18 207 lb (93.9 kg)  08/11/18 206 lb (93.4 kg)     Objective:    Vital Signs:  BP (!) 163/91   Pulse 73   Ht 6' (1.829 m)   Wt 200 lb (90.7 kg)   BMI 27.12 kg/m    VITAL SIGNS:  reviewed  ASSESSMENT & PLAN:    1. HTN  Meds adjusted in Jan 2020 with high BP   Today bp is higher than usually is   Normally  159/88    139/   125/   153/82   BP is a little high but with current conditions I would keep on current regimen  He does note that he is urinating more, sometimes with accidents     Recomm:   KCL Cut back to 2x per wk  Cut lasix in 1/2 to 20 mg    Check BP daily  Will call back in 3 to 4 wks to review response    2   PAF   Keep on eliquis    3   Hx CVA  Keep on Eliquis   4   HL  Lipids excellent   LDL 54  HDL 49    5  DM   Hgb A1C was 11.6  Was 6.1 7 months ago  Getting back on normal diet  5.  COVID-19 Education: The  signs and symptoms of COVID-19 were discussed with the patient and how to seek care for testing (follow up with PCP or arrange E-visit).  he importance of social distancing was  discussed today.  Time:   Today, I have spent 20  minutes with the patient with telehealth technology discussing the above problems.     Medication Adjustments/Labs and Tests Ordered: Current medicines are reviewed at length with the patient today.  Concerns regarding medicines are outlined above.   Tests Ordered: No orders of the defined types were placed in this encounter.   Medication Changes: No orders of the defined types were placed in this encounter.   Disposition:  Follow up 4 wks for BP reivew   Signed, Dorris Carnes, MD  02/06/2019 10:36 AM    [place]

## 2019-02-06 NOTE — Addendum Note (Signed)
Addended by: Rodman Key on: 02/06/2019 12:36 PM   Modules accepted: Orders

## 2019-02-15 ENCOUNTER — Other Ambulatory Visit: Payer: Self-pay | Admitting: Internal Medicine

## 2019-02-17 ENCOUNTER — Other Ambulatory Visit: Payer: Self-pay | Admitting: Internal Medicine

## 2019-02-22 ENCOUNTER — Other Ambulatory Visit (HOSPITAL_COMMUNITY): Payer: Self-pay | Admitting: Nurse Practitioner

## 2019-02-23 NOTE — Telephone Encounter (Signed)
Eliquis 5mg  refill request received; pt is 83 yrs old, wt-93.9kg, Crea-1.04 on 02/02/2019, last visit with Dr. Harrington Challenger on 02/06/2019; will send in refill to requested pharmacy.

## 2019-02-24 ENCOUNTER — Other Ambulatory Visit: Payer: Self-pay | Admitting: Family Medicine

## 2019-03-11 ENCOUNTER — Telehealth: Payer: Self-pay | Admitting: Internal Medicine

## 2019-03-11 NOTE — Telephone Encounter (Signed)
New Message     Left message to confirm appt

## 2019-03-12 ENCOUNTER — Encounter: Payer: Self-pay | Admitting: Internal Medicine

## 2019-03-12 ENCOUNTER — Other Ambulatory Visit: Payer: Self-pay

## 2019-03-12 ENCOUNTER — Telehealth (INDEPENDENT_AMBULATORY_CARE_PROVIDER_SITE_OTHER): Payer: Medicare HMO | Admitting: Internal Medicine

## 2019-03-12 VITALS — Ht 72.0 in | Wt 205.0 lb

## 2019-03-12 DIAGNOSIS — E782 Mixed hyperlipidemia: Secondary | ICD-10-CM | POA: Diagnosis not present

## 2019-03-12 DIAGNOSIS — I10 Essential (primary) hypertension: Secondary | ICD-10-CM | POA: Diagnosis not present

## 2019-03-12 DIAGNOSIS — I639 Cerebral infarction, unspecified: Secondary | ICD-10-CM

## 2019-03-12 DIAGNOSIS — I480 Paroxysmal atrial fibrillation: Secondary | ICD-10-CM

## 2019-03-12 NOTE — Patient Instructions (Signed)
Medication Instructions:  No changes If you need a refill on your cardiac medications before your next appointment, please call your pharmacy.   Lab work: None  Testing/Procedures: none  Follow-Up: At [facility], you and your health needs are our priority.  As part of our continuing mission to provide you with exceptional heart care, we have created designated Provider Care Teams.  These Care Teams include your primary Cardiologist (physician) and Advanced Practice Providers (APPs -  Physician Assistants and Nurse Practitioners) who all work together to provide you with the care you need, when you need it. You will need a follow up appointment in:  8-9 months.  Please call our office 2 months in advance to schedule this appointment.  You may see Dorris Carnes, MD or one of the following Advanced Practice Providers on your designated Care Team: Richardson Dopp, PA-C [place], [place] . Daune Perch, NP  Any Other Special Instructions Will Be Listed Below (If Applicable).

## 2019-03-12 NOTE — Progress Notes (Signed)
Virtual Visit via Telephone Note   This visit type was conducted due to national recommendations for restrictions regarding the COVID-19 Pandemic (e.g. social distancing) in an effort to limit this patient's exposure and mitigate transmission in our community.  Due to his co-morbid illnesses, this patient is at least at moderate risk for complications without adequate follow up.  This format is felt to be most appropriate for this patient at this time.  The patient did not have access to video technology/had technical difficulties with video requiring transitioning to audio format only (telephone).  All issues noted in this document were discussed and addressed.  No physical exam could be performed with this format.  Please refer to the patient's chart for his  consent to telehealth for [facility].   Date:  03/12/2019   ID:  Brian [name], DOB 06/26/1936, MRN [id number]  Patient Location: Home Provider Location: Home  PCP:  [name], MD  Cardiologist:  Harrington Challenger   Evaluation Performed:  Follow-Up Visit  Chief Complaint:  F?U of HTN    History of Present Illness:    Brian [name] is a 84 y.o. male with hx of DM, HTN, CVA (7.2019, 06/2018), PAF (CHADSVASc 6)  I saw him vertually in clinic in May  At that time BP labile, a little high   Cut lasix in 1/2 and K in 1/2   Plan to call bac    BP today 133/70   Pt says it is usually in this range  Breating is OK   No CP  No dizziness Taking Eliquis 2x per day   No palpitations     A1C  High  Pt admits to diet  Has changed   Better   Glu 120s    The patient does not have symptoms concerning for COVID-19 infection (fever, chills, cough, or new shortness of breath).    Past Medical History:  Diagnosis Date  . ALLERGIC RHINITIS 10/13/2007  . ANEMIA, CHRONIC DISEASE NEC 03/31/2007  . BENIGN PROSTATIC HYPERTROPHY 03/31/2007  . Chest pain    Stress echo, normal, December, 2012  . Chronic anemia   . DIABETES MELLITUS, TYPE II 03/31/2007  .  [place] DISEASE, LUMBAR 03/31/2007  . ED (erectile dysfunction)   . Ejection fraction    EF  normal, stress echo, December,  2012  . History of prostatitis   . HYPERLIPIDEMIA 03/31/2007  . HYPERTENSION 03/31/2007  . Nephrolithiasis 12/07/2010  . Obesity   . POLYP, ANAL AND RECTAL 03/31/2007  . Right eye trauma    hx as child  . Stroke [facility])    Past Surgical History:  Procedure Laterality Date  . [place] SURGERY     1999  . PROSTATE BIOPSY     s/p  . RECTAL POLYPECTOMY     Transanal excision 10/2005  . SKIN BIOPSY     s/p right upper back 2009- benign Dr. Tonia Brooms     Current Meds  Medication Sig  . ACCU-CHEK SOFTCLIX LANCETS lancets Use as instructed twice per day Dx Code E11.9  . acetaminophen (TYLENOL) 325 MG tablet Take 2 tablets (650 mg total) by mouth every 4 (four) hours as needed for mild pain or fever (or temp > 37.5 C (99.5 F)).  [name] amLODipine (NORVASC) 10 MG tablet Take 1 tablet (10 mg total) by mouth daily. (Patient taking differently: Take 20 mg by mouth daily. )  . aspirin EC 81 MG EC tablet Take 1 tablet (81 mg total) by  mouth daily. With breakfast  . atorvastatin (LIPITOR) 80 MG tablet TAKE 1 TABLET BY MOUTH EVERY DAY IN THE EVENING AT 6PM  . carvedilol (COREG) 6.25 MG tablet TAKE 1 TABLET BY MOUTH TWICE A DAY  . ELIQUIS 5 MG TABS tablet TAKE 1 TABLET BY MOUTH TWICE A DAY  . furosemide (LASIX) 20 MG tablet Take 1 tablet (20 mg total) by mouth daily.  [name] gabapentin (NEURONTIN) 100 MG capsule TAKE 1 CAPSULE BY MOUTH THREE TIMES A DAY  . glucose blood (ACCU-CHEK AVIVA) test strip Use as instructed twice per day Dx E11.9  . JANUVIA 100 MG tablet TAKE 1 TABLET BY MOUTH EVERY DAY  . losartan (COZAAR) 100 MG tablet TAKE 1 TABLET BY MOUTH EVERY DAY  . metFORMIN (GLUMETZA) 1000 MG (MOD) 24 hr tablet Take 1 tablet (1,000 mg total) by mouth 2 (two) times daily before a meal.  . OVER THE COUNTER MEDICATION Place 1 drop into both eyes daily as needed (dry eyes). Over the counter  lubricating eye drop  . potassium chloride SA (K-DUR) 20 MEQ tablet Take 1 tablet (20 mEq total) by mouth 2 (two) times a week.  . tadalafil (CIALIS) 20 MG tablet Take 1 tablet (20 mg total) by mouth daily as needed for up to 30 days for erectile dysfunction.     Allergies:   Patient has no known allergies.   Social History   Tobacco Use  . Smoking status: Former Smoker    Years: 10.00    Quit date: 1995    Years since quitting: 25.4  . Smokeless tobacco: Never Used  Substance Use Topics  . Alcohol use: Yes    Alcohol/week: 1.0 standard drinks    Types: 1 Cans of beer per week    Comment: occasional or yearly  . Drug use: No     Family Hx: The patient's family history includes Colon polyps in his mother; Stroke in his mother. There is no history of CVA or Seizures.  ROS:   Please see the history of present illness.     All other systems reviewed and are negative.   Prior CV studies:   The following studies were reviewed today:    Labs/Other Tests and Data Reviewed:    EKG:  No ECG reviewed.  Recent Labs: 02/02/2019: ALT 16; BUN 13; Creatinine, Ser 1.04; Hemoglobin 12.0; Platelets 187.0; Potassium 4.4; Sodium 137; TSH 1.78   Recent Lipid Panel Lab Results  Component Value Date/Time   CHOL 118 02/02/2019 11:42 AM   CHOL 141 10/09/2018 01:28 PM   TRIG 74.0 02/02/2019 11:42 AM   HDL 48.80 02/02/2019 11:42 AM   HDL 57 10/09/2018 01:28 PM   CHOLHDL 2 02/02/2019 11:42 AM   LDLCALC 54 02/02/2019 11:42 AM   LDLCALC 69 10/09/2018 01:28 PM    Wt Readings from Last 3 Encounters:  03/12/19 205 lb (93 kg)  02/06/19 200 lb (90.7 kg)  10/09/18 207 lb (93.9 kg)     Objective:    Vital Signs:  Ht 6' (1.829 m)   Wt 205 lb (93 kg)   BMI 27.80 kg/m    PT says bp 133/  ASSESSMENT & PLAN:    1. HTN  PEr his report BP has been in 130s  COntinue current regimen   K OK on 5/18   4.4    2  PAF Keep on Eliquis  Denies palpitations    3   Hx CVA   Keep on Eliquis   Taking  bid  4  HL    Lipids in May 2020 LDL 54  HDL 48    5  DM   A1C back up to 11.6  Since may when this was drawn, he is watching what he eats and glucose is much better   120s  COntinue   6  COVID-19 Education: The signs and symptoms of COVID-19 were discussed with the patient and how to seek care for testing (follow up with PCP or arrange E-visit).   The importance of social distancing was discussed today.  Time:   Today, I have spent 15  minutes with the patient with telehealth technology discussing the above problems.     Medication Adjustments/Labs and Tests Ordered: Current medicines are reviewed at length with the patient today.  Concerns regarding medicines are outlined above.   Tests Ordered: No orders of the defined types were placed in this encounter.   Medication Changes: No orders of the defined types were placed in this encounter.   Follow Up:  In Person in Feb/March 2021  Signed, Dorris Carnes, MD  03/12/2019 9:56 AM    [place]

## 2019-03-22 ENCOUNTER — Other Ambulatory Visit: Payer: Self-pay | Admitting: Internal Medicine

## 2019-03-27 ENCOUNTER — Other Ambulatory Visit: Payer: Self-pay | Admitting: Internal Medicine

## 2019-03-27 ENCOUNTER — Telehealth: Payer: Self-pay | Admitting: Internal Medicine

## 2019-03-27 MED ORDER — METFORMIN HCL ER (MOD) 1000 MG PO TB24: 1000.0000 mg | ORAL_TABLET | Freq: Two times a day (BID) | ORAL | 1 refills | Status: DC

## 2019-03-27 NOTE — Telephone Encounter (Signed)
Pt called stating he is no longer seeing the provider who prescribed his Metformin. Pt is requesting to get it refilled by PCP. Pt states he has been out of medication for 2 weeks. Please advise.   [id number] [address], [place] [address]  Phone: [phone] Fax: [phone]  Not a 24 hour pharmacy; exact hours not known.

## 2019-03-30 ENCOUNTER — Telehealth: Payer: Self-pay | Admitting: Internal Medicine

## 2019-03-30 MED ORDER — METFORMIN HCL 500 MG PO TABS: 1000.0000 mg | ORAL_TABLET | Freq: Two times a day (BID) | ORAL | 3 refills | Status: DC

## 2019-03-30 NOTE — Addendum Note (Signed)
Addended by: [name] on: 03/30/2019 07:01 PM   Modules accepted: Orders

## 2019-03-30 NOTE — Telephone Encounter (Signed)
Pt states his insurance will not cover metFORMIN (GLUMETZA) 1000 MG (MOD) 24 hr tablet.  He is requesting that Dr Jenny Reichmann send in a new rx for the 500 MG tablets.  Please call pt to advise: [phone]  Preferred Pharmacy:  [id number] [place], [place] [phone] (Phone) [phone] (Fax)

## 2019-03-30 NOTE — Telephone Encounter (Signed)
Done erx for regular metformin

## 2019-04-09 ENCOUNTER — Ambulatory Visit: Payer: Medicare HMO | Admitting: Adult Health

## 2019-04-10 ENCOUNTER — Other Ambulatory Visit: Payer: Self-pay | Admitting: Cardiovascular Disease

## 2019-04-10 NOTE — Telephone Encounter (Signed)
Outpatient Medication Detail   Disp Refills Start End   furosemide (LASIX) 20 MG tablet 90 tablet 3 02/06/2019    Sig - Route: Take 1 tablet (20 mg total) by mouth daily. - Oral   Sent to pharmacy as: furosemide (LASIX) 20 MG tablet   Notes to Pharmacy: Changed from 40 mg tablet. Please be sure pt is aware of strength of tablet is now 20 mg.   E-Prescribing Status: Receipt confirmed by pharmacy (02/06/2019 12:16 PM EDT)   Pharmacy  [id number] [place], [place]

## 2019-04-14 ENCOUNTER — Ambulatory Visit: Payer: Medicare HMO | Admitting: Adult Health

## 2019-04-14 ENCOUNTER — Other Ambulatory Visit: Payer: Self-pay

## 2019-04-14 ENCOUNTER — Encounter: Payer: Self-pay | Admitting: Adult Health

## 2019-04-14 VITALS — BP 152/86 | HR 66 | Temp 98.6°F | Ht 72.0 in | Wt 203.2 lb

## 2019-04-14 DIAGNOSIS — I480 Paroxysmal atrial fibrillation: Secondary | ICD-10-CM

## 2019-04-14 DIAGNOSIS — I10 Essential (primary) hypertension: Secondary | ICD-10-CM

## 2019-04-14 DIAGNOSIS — Z8673 Personal history of transient ischemic attack (TIA), and cerebral infarction without residual deficits: Secondary | ICD-10-CM | POA: Diagnosis not present

## 2019-04-14 DIAGNOSIS — E785 Hyperlipidemia, unspecified: Secondary | ICD-10-CM | POA: Diagnosis not present

## 2019-04-14 NOTE — Progress Notes (Signed)
[facility] [address] [place]. [place] [address] [phone]       OFFICE FOLLOW UP NOTE  [name] Date of Birth:  08/13/1936 Medical Record Number:  [id number]   Reason for Referral:  seizure activity and stroke follow up  CHIEF COMPLAINT:  Chief Complaint  Patient presents with   Follow-up    Rm 9, alone.     Cerebrovascular Accident    Stable.     HPI:  Brian [name] is a 83 year old male with PMH of BPH, DM, HDL, HTN, atrial fibrillation on Eliquis, intracranial stenosis and strokes.  He is being followed in this office due to recent strokes.  On 04/01/2018, patient presented with left arm stiffness, gaze deviation, garbled speech and drooling concerning for seizure activity.  EEG negative and imaging showed right occipital infarct and chronic right cerebellar infarct secondary to small vessel disease versus embolic source.  As seizure activity first event and not related to stroke with EEG negative, no AEDs initiated at that time and advised not to drive for 6 months.  Imaging showed evidence of left M1 70% stenosis.  He was discharged on DAPT and continuation of atorvastatin with recommendation of 30-day cardiac event monitor to rule out atrial fibrillation.  He returned to ED on 06/22/2018 with right-sided weakness, slurred speech and facial droop and was found to have left CR infarct secondary to small vessel disease.  During the interval time, patient did complete 30-day cardiac event monitor which showed evidence of PAF and was started on Eliquis.  Was also recommended to initiate aspirin 81 mg along with continuation of atorvastatin 80 mg daily for secondary stroke prevention and evidence of intracranial stenosis.  He did have follow-up appointment on 08/11/2018 in this office but due to symptomatic hypotension and tachycardia, patient was transferred to ED via EMS.  He continues to follow with Dr. Cathie Olden for HTN management and Dr. Harrington Challenger for atrial  fibrillation management.  10/09/2018 update: Patient is being seen today for follow-up visit as he was transferred to ED via EMS after prior appointment due to hypotension and tachycardia.  Upon arrival to ED, blood pressure and heart rate normalized.  He was followed by cardiology Dr. [name] on 08/26/2018 with initiating carvedilol, furosemide and potassium supplementation.  Was also recommended to discontinue amlodipine due to depressed EF but advised to follow-up with primary cardiologist prior to discontinuation.  Blood pressure today 160/86. He does monitor at home and typically ranges 120-140s/60-70s. He continues on Eliquis and aspirin 81 mg without side effects of bleeding or bruising.  Continues on atorvastatin 80 mg without side effects myalgias.  Lipid panel has not been obtained since hospital admission on 06/23/2018.  Glucose levels have been stable.  He denies any recurrent seizure activity and is eager to return to driving as it has been greater than 6 months time.  He continues to do well from a stroke standpoint without residual deficits or recurring of symptoms.   04/14/2019 update: Brian [name] is a 83 year old male who is being seen today for stroke and seizure follow-up.  He has been stable from a neurological standpoint.  He has not had any additional seizure activity.  Continues on Eliquis and aspirin 81 mg without bleeding or bruising and atorvastatin without myalgias.  Recent lipid panel by PCP on 02/02/2019 showed LDL 54.  A1c elevated at 11.6.  He does endorse having difficulty obtaining metformin medication and believes this is the reason for his recent elevated A1c.  He continues to follow with PCP for ongoing monitoring/management.  Blood pressure 152/86 but does monitor at home typically ranging SBP 120s to 140.  He does endorse left hand pain that has been present over the past couple weeks.  No further concerns at this time.    ROS:   14 system review of systems performed and  negative with exception of no complaints  PMH:  Past Medical History:  Diagnosis Date   ALLERGIC RHINITIS 10/13/2007   ANEMIA, CHRONIC DISEASE NEC 03/31/2007   BENIGN PROSTATIC HYPERTROPHY 03/31/2007   Chest pain    Stress echo, normal, December, 2012   Chronic anemia    DIABETES MELLITUS, TYPE II 03/31/2007   [place] DISEASE, LUMBAR 03/31/2007   ED (erectile dysfunction)    Ejection fraction    EF  normal, stress echo, December,  2012   History of prostatitis    HYPERLIPIDEMIA 03/31/2007   HYPERTENSION 03/31/2007   Nephrolithiasis 12/07/2010   Obesity    POLYP, ANAL AND RECTAL 03/31/2007   Right eye trauma    hx as child   Stroke [facility])     PSH:  Past Surgical History:  Procedure Laterality Date   LUMBAR [place]     s/p   RECTAL POLYPECTOMY     Transanal excision 10/2005   SKIN BIOPSY     s/p right upper back 2009- benign Dr. Tonia Brooms    Social History:  Social History   Socioeconomic History   Marital status: Widowed    Spouse name: Not on file   Number of children: 2   Years of education: Not on file   Highest education level: Not on file  Occupational History   Occupation: Acupuncturist    Comment: Retired 1999  Social Designer, fashion/clothing strain: Not on file   Food insecurity    Worry: Not on file    Inability: Not on Lexicographer needs    Medical: Not on file    Non-medical: Not on file  Tobacco Use   Smoking status: Former Smoker    Years: 10.00    Quit date: 1995    Years since quitting: 25.5   Smokeless tobacco: Never Used  Substance and Sexual Activity   Alcohol use: Yes    Alcohol/week: 1.0 standard drinks    Types: 1 Cans of beer per week    Comment: occasional or yearly   Drug use: No   Sexual activity: Not on file  Lifestyle   Physical activity    Days per week: Not on file    Minutes per session: Not on file   Stress: Not on file  Relationships    Social connections    Talks on phone: Not on file    Gets together: Not on file    Attends religious service: Not on file    Active member of club or organization: Not on file    Attends meetings of clubs or organizations: Not on file    Relationship status: Not on file   Intimate partner violence    Fear of current or ex partner: Not on file    Emotionally abused: Not on file    Physically abused: Not on file    Forced sexual activity: Not on file  Other Topics Concern   Not on file  Social History Narrative   Not on file    Family History:  Family History  Problem Relation Age of Onset   Colon polyps Mother    Stroke Mother        due to ICA stenosis   CVA Neg Hx    Seizures Neg Hx     Medications:   Current Outpatient Medications on File Prior to Visit  Medication Sig Dispense Refill   ACCU-CHEK SOFTCLIX LANCETS lancets Use as instructed twice per day Dx Code E11.9 200 each 3   acetaminophen (TYLENOL) 325 MG tablet Take 2 tablets (650 mg total) by mouth every 4 (four) hours as needed for mild pain or fever (or temp > 37.5 C (99.5 F)). 20 tablet 1   amLODipine (NORVASC) 10 MG tablet Take 1 tablet (10 mg total) by mouth daily. 90 tablet 3   aspirin EC 81 MG EC tablet Take 1 tablet (81 mg total) by mouth daily. With breakfast 30 tablet 2   atorvastatin (LIPITOR) 80 MG tablet TAKE 1 TABLET BY MOUTH EVERY DAY IN THE EVENING AT 6PM 30 tablet 5   carvedilol (COREG) 6.25 MG tablet TAKE 1 TABLET BY MOUTH TWICE A DAY 60 tablet 11   ELIQUIS 5 MG TABS tablet TAKE 1 TABLET BY MOUTH TWICE A DAY 60 tablet 5   furosemide (LASIX) 20 MG tablet Take 1 tablet (20 mg total) by mouth daily. 90 tablet 3   gabapentin (NEURONTIN) 100 MG capsule TAKE 1 CAPSULE BY MOUTH THREE TIMES A DAY 90 capsule 1   glucose blood (ACCU-CHEK AVIVA) test strip Use as instructed twice per day Dx E11.9 200 each 3   JANUVIA 100 MG tablet TAKE 1 TABLET BY MOUTH EVERY DAY 90 tablet 3   losartan  (COZAAR) 100 MG tablet TAKE 1 TABLET BY MOUTH EVERY DAY 90 tablet 0   metFORMIN (GLUCOPHAGE) 500 MG tablet Take 2 tablets (1,000 mg total) by mouth 2 (two) times daily with a meal. (Patient taking differently: Take 1,000 mg by mouth daily with breakfast. ) 360 tablet 3   OVER THE COUNTER MEDICATION Place 1 drop into both eyes daily as needed (dry eyes). Over the counter lubricating eye drop     potassium chloride SA (K-DUR) 20 MEQ tablet Take 1 tablet (20 mEq total) by mouth 2 (two) times a week. 25 tablet 3   No current facility-administered medications on file prior to visit.     Allergies:  No Known Allergies   Physical Exam  Vitals:   04/14/19 1433  BP: (!) 152/86  Pulse: 66  Temp: 98.6 F (37 C)  Weight: 203 lb 3.2 oz (92.2 kg)  Height: 6' (1.829 m)   Body mass index is 27.56 kg/m. No exam data present  General: well developed, well nourished, pleasant elderly [place] male, seated, in no evident distress Head: head normocephalic and atraumatic.   Neck: supple with no carotid or supraclavicular bruits Cardiovascular:  regular rate and rhythm, no murmurs  Neurologic Exam Mental Status: Awake and fully alert. Oriented to place and time. Recent and remote memory intact. Attention span, concentration and fund of knowledge appropriate. Mood and affect appropriate.  Cranial Nerves: Fundoscopic exam reveals sharp disc margins. Pupils equal, briskly reactive to light. Extraocular movements full without nystagmus. Visual fields full to confrontation. Hearing intact. Facial sensation intact. Face, tongue, palate moves normally and symmetrically.  Motor: Normal bulk and tone. Normal strength in all tested extremity muscles Sensory.: intact to touch , pinprick , position and vibratory sensation.  Coordination: Rapid alternating movements normal in all extremities. Finger-to-nose and heel-to-shin performed accurately  bilaterally. Gait and Station: Arises from chair without  difficulty. Stance is normal. Gait demonstrates normal stride length and balance with assistance of cane. Reflexes: 1+ and symmetric. Toes downgoing.      Diagnostic Data (Labs, Imaging, Testing)  CT head without contrast 04/01/2018 IMPRESSION: 1. 5 mm acute ischemic nonhemorrhagic subcortical infarct at the right parieto-occipital region, posterior right MCA distribution. 2. Age-related cerebral atrophy with moderate chronic small vessel ischemic disease.  CTA head/neck with and without contrast 04/01/2018 IMPRESSION: Mild atherosclerotic change at both carotid bifurcations. Slight irregularity of the ICA bulb on the right, but without measurable stenosis. Intracranial medium vessel atherosclerotic change. 70% stenosis of the distal M1 segment on the left. I do not identify any occluded branches. 50% stenosis of the right vertebral artery origin.  MRI brain without contrast 04/01/2018 IMPRESSION: 1. 5 mm acute ischemic nonhemorrhagic subcortical infarct at the right parieto-occipital region, posterior right MCA distribution. 2. Age-related cerebral atrophy with moderate chronic small vessel ischemic disease.  CT HEAD WO CONTRAST 06/22/2018 IMPRESSION: 1. No acute intracranial abnormality. 2. Old right cerebellar infarct. 3. Chronic microvascular white matter disease  MR BRAIN WO CONTRAST 06/23/2018 IMPRESSION: 1. 1 cm acute infarct in the left corona radiata. 2. Chronic small vessel ischemia in the cerebral white matter.     ASSESSMENT: Brian [name] is a 83 y.o. year old male here with right parietal-occipital region infarct on 04/01/2018 secondary to small vessel disease versus cardioembolic source along with seizure activity. Vascular risk factors include DM, HTN and HLD.  30-day cardiac monitor showed evidence of atrial fibrillation and was placed on Eliquis.  On 06/22/2018, patient was found to have left CR infarct secondary to small vessel disease.  It was  recommended to continue statin, Eliquis and aspirin for secondary stroke prevention.  He has recovered well from a stroke standpoint without residual deficits or reoccurring of symptoms.    PLAN: 1. Right parietal occipital region infarct and left CR infarct: Continue aspirin 81 mg daily and Eliquis (apixaban) daily  and atorvastatin for secondary stroke prevention. Maintain strict control of hypertension with blood pressure goal 130-150, diabetes with hemoglobin A1c goal below 6.5% and cholesterol with LDL cholesterol (bad cholesterol) goal below 70 mg/dL.  I also advised the patient to eat a healthy diet with plenty of whole grains, cereals, fruits and vegetables, exercise regularly with at least 30 minutes of continuous activity daily and maintain ideal body weight. 2. Atrial fibrillation: Continue to follow with Dr. Harrington Challenger for continued management along with continuation of Eliquis 3. Intracranial stenosis: Maintain BP 1 30-1 50 along with ensuring management of HTN, HLD and DM along with continued use of aspirin 81 mg daily and statin 4. HTN: Advised to continue current treatment regimen.  Today's BP 160/86.  Advised to continue to monitor at home along with continued follow-up with PCP for management 5. HLD: Advised to continue current treatment regimen along with continued follow-up with PCP for future prescribing and monitoring of lipid panel.  Lipid panel obtained today as this has not been completed since hospital admission 6. DMII: Advised to continue to monitor glucose levels at home along with continued follow-up with PCP for management and monitoring    Stable from stroke standpoint recommend follow-up as needed   Greater than 50% of time during this 25 minute visit was spent on counseling,explanation of diagnosis of right parietal occipital region infarct and left CR infarct, reviewing risk factor management of atrial fibrillation, intracranial stenosis, HTN, HLD and DM, planning  of  further management, discussion with patient and family and coordination of care     Brian [name], [facility]  [facility] [address] [place], [place] [address]  Phone [phone] Fax [phone]

## 2019-04-14 NOTE — Patient Instructions (Signed)
Continue Eliquis (apixaban) daily  and Lipitor for secondary stroke prevention  Continue to follow up with PCP regarding cholesterol, diabetes and blood pressure management   Ongoing follow-up with cardiology for atrial fibrillation and Eliquis management  Continue to monitor blood pressure at home  Maintain strict control of hypertension with blood pressure goal below 130/90, diabetes with hemoglobin A1c goal below 6.5% and cholesterol with LDL cholesterol (bad cholesterol) goal below 70 mg/dL. I also advised the patient to eat a healthy diet with plenty of whole grains, cereals, fruits and vegetables, exercise regularly and maintain ideal body weight.        Thank you for coming to see [place] at [facility]. I hope we have been able to provide you high quality care today.  You may receive a patient satisfaction survey over the next few weeks. We would appreciate your feedback and comments so that we may continue to improve ourselves and the health of our patients.

## 2019-04-15 NOTE — Progress Notes (Signed)
I agree with the above plan 

## 2019-04-16 ENCOUNTER — Other Ambulatory Visit: Payer: Self-pay | Admitting: Internal Medicine

## 2019-04-27 ENCOUNTER — Other Ambulatory Visit: Payer: Self-pay | Admitting: Family Medicine

## 2019-04-30 ENCOUNTER — Other Ambulatory Visit: Payer: Self-pay | Admitting: Internal Medicine

## 2019-05-18 ENCOUNTER — Other Ambulatory Visit: Payer: Self-pay | Admitting: Internal Medicine

## 2019-05-18 MED ORDER — ATORVASTATIN CALCIUM 80 MG PO TABS: ORAL_TABLET | ORAL | 5 refills | Status: DC

## 2019-05-18 NOTE — Telephone Encounter (Signed)
Medication Refill - Medication: atorvastatin (LIPITOR) 80 MG tablet  Has the patient contacted their pharmacy? yes (Agent: If no, request that the patient contact the pharmacy for the refill.) (Agent: If yes, when and what did the pharmacy advise?)  Preferred Pharmacy (with phone number or street name):  [id number] [address], [place] [phone] (Phone) [phone] (Fax)   Agent: Please be advised that RX refills may take up to 3 business days. We ask that you follow-up with your pharmacy.

## 2019-05-22 ENCOUNTER — Telehealth: Payer: Self-pay | Admitting: *Deleted

## 2019-05-22 ENCOUNTER — Other Ambulatory Visit: Payer: Self-pay

## 2019-05-22 ENCOUNTER — Ambulatory Visit (INDEPENDENT_AMBULATORY_CARE_PROVIDER_SITE_OTHER): Payer: Medicare HMO

## 2019-05-22 DIAGNOSIS — Z23 Encounter for immunization: Secondary | ICD-10-CM | POA: Diagnosis not present

## 2019-05-22 NOTE — Telephone Encounter (Signed)
I dont understand since the last rx was for the regular metfomin  And not the ER version.  No other need to change

## 2019-05-22 NOTE — Telephone Encounter (Signed)
Pt wanted to let MD know he received a letter from [facility] stating his Metformin ER med has been recalled. He is needing an alternative [facility] to [facility]../lmb

## 2019-05-22 NOTE — Telephone Encounter (Signed)
Notified pt w/MD response.../lmb 

## 2019-07-11 ENCOUNTER — Other Ambulatory Visit: Payer: Self-pay | Admitting: Internal Medicine

## 2019-08-06 DIAGNOSIS — H2512 Age-related nuclear cataract, left eye: Secondary | ICD-10-CM | POA: Diagnosis not present

## 2019-08-06 DIAGNOSIS — E113293 Type 2 diabetes mellitus with mild nonproliferative diabetic retinopathy without macular edema, bilateral: Secondary | ICD-10-CM | POA: Diagnosis not present

## 2019-08-06 DIAGNOSIS — H25012 Cortical age-related cataract, left eye: Secondary | ICD-10-CM | POA: Diagnosis not present

## 2019-08-06 DIAGNOSIS — H40013 Open angle with borderline findings, low risk, bilateral: Secondary | ICD-10-CM | POA: Diagnosis not present

## 2019-08-06 LAB — HM DIABETES EYE EXAM

## 2019-08-11 ENCOUNTER — Ambulatory Visit (INDEPENDENT_AMBULATORY_CARE_PROVIDER_SITE_OTHER): Payer: Medicare HMO | Admitting: Internal Medicine

## 2019-08-11 ENCOUNTER — Encounter: Payer: Self-pay | Admitting: Internal Medicine

## 2019-08-11 ENCOUNTER — Other Ambulatory Visit: Payer: Self-pay

## 2019-08-11 VITALS — BP 138/86 | HR 89 | Temp 98.1°F | Ht 72.0 in | Wt 208.0 lb

## 2019-08-11 DIAGNOSIS — I10 Essential (primary) hypertension: Secondary | ICD-10-CM

## 2019-08-11 DIAGNOSIS — E559 Vitamin D deficiency, unspecified: Secondary | ICD-10-CM

## 2019-08-11 DIAGNOSIS — Z0000 Encounter for general adult medical examination without abnormal findings: Secondary | ICD-10-CM

## 2019-08-11 DIAGNOSIS — E782 Mixed hyperlipidemia: Secondary | ICD-10-CM | POA: Diagnosis not present

## 2019-08-11 DIAGNOSIS — E119 Type 2 diabetes mellitus without complications: Secondary | ICD-10-CM

## 2019-08-11 DIAGNOSIS — E611 Iron deficiency: Secondary | ICD-10-CM | POA: Diagnosis not present

## 2019-08-11 DIAGNOSIS — Z8673 Personal history of transient ischemic attack (TIA), and cerebral infarction without residual deficits: Secondary | ICD-10-CM

## 2019-08-11 DIAGNOSIS — E1165 Type 2 diabetes mellitus with hyperglycemia: Secondary | ICD-10-CM | POA: Diagnosis not present

## 2019-08-11 DIAGNOSIS — N521 Erectile dysfunction due to diseases classified elsewhere: Secondary | ICD-10-CM

## 2019-08-11 DIAGNOSIS — IMO0002 Reserved for concepts with insufficient information to code with codable children: Secondary | ICD-10-CM

## 2019-08-11 DIAGNOSIS — R69 Illness, unspecified: Secondary | ICD-10-CM | POA: Diagnosis not present

## 2019-08-11 DIAGNOSIS — E1159 Type 2 diabetes mellitus with other circulatory complications: Secondary | ICD-10-CM | POA: Diagnosis not present

## 2019-08-11 DIAGNOSIS — E538 Deficiency of other specified B group vitamins: Secondary | ICD-10-CM

## 2019-08-11 LAB — POCT GLYCOSYLATED HEMOGLOBIN (HGB A1C): Hemoglobin A1C: 8.2 % — AB (ref 4.0–5.6)

## 2019-08-11 MED ORDER — LANCETS MISC: 3 refills | Status: AC

## 2019-08-11 MED ORDER — ONETOUCH VERIO W/DEVICE KIT: PACK | 0 refills | Status: AC

## 2019-08-11 MED ORDER — ONETOUCH VERIO VI STRP: ORAL_STRIP | 12 refills | Status: DC

## 2019-08-11 NOTE — Patient Instructions (Addendum)
Your A1c was high today  Please call if you change your mind about increased medication for this  Please continue all other medications as before, and refills have been done if requested.  Please have the pharmacy call with any other refills you may need.  Please continue your efforts at being more active, low cholesterol diabetic diet, and weight control..  Please keep your appointments with your specialists as you may have planned  Please return in 3 months, or sooner if needed, with Lab testing done 3-5 days before

## 2019-08-11 NOTE — Progress Notes (Signed)
Subjective:    Patient ID: Brian [name], male    DOB: 1936-05-13, 83 y.o.   MRN: [id number]  HPI  Here to f/u; overall doing ok,  Pt denies chest pain, increasing sob or doe, wheezing, orthopnea, PND, increased LE swelling, palpitations, dizziness or syncope.  Pt denies new neurological symptoms such as new headache, or facial or extremity weakness or numbness.  Pt denies polydipsia, polyuria, or low sugar episode.  Pt states overall good compliance with meds, but admits to not trying to follow appropriate diet as his wife died one yr ago and he eats out for every meal. Also with ongoing right knee pain for several months and years, mild, intermittent, worse to walk, no swelling, no giveaways or falls but keeps recurring.  Has a pain med at home that works but cant recall the name and not on the med list here.  Past Medical History:  Diagnosis Date  . ALLERGIC RHINITIS 10/13/2007  . ANEMIA, CHRONIC DISEASE NEC 03/31/2007  . BENIGN PROSTATIC HYPERTROPHY 03/31/2007  . Chest pain    Stress echo, normal, December, 2012  . Chronic anemia   . DIABETES MELLITUS, TYPE II 03/31/2007  . [place] DISEASE, LUMBAR 03/31/2007  . ED (erectile dysfunction)   . Ejection fraction    EF  normal, stress echo, December,  2012  . History of prostatitis   . HYPERLIPIDEMIA 03/31/2007  . HYPERTENSION 03/31/2007  . Nephrolithiasis 12/07/2010  . Obesity   . POLYP, ANAL AND RECTAL 03/31/2007  . Right eye trauma    hx as child  . Stroke [facility])    Past Surgical History:  Procedure Laterality Date  . [place] SURGERY     1999  . PROSTATE BIOPSY     s/p  . RECTAL POLYPECTOMY     Transanal excision 10/2005  . SKIN BIOPSY     s/p right upper back 2009- benign Dr. Tonia Brooms    reports that he quit smoking about 25 years ago. He quit after 10.00 years of use. He has never used smokeless tobacco. He reports current alcohol use of about 1.0 standard drinks of alcohol per week. He reports that he does not use drugs. family  history includes Colon polyps in his mother; Stroke in his mother. No Known Allergies Current Outpatient Medications on File Prior to Visit  Medication Sig Dispense Refill  . acetaminophen (TYLENOL) 325 MG tablet Take 2 tablets (650 mg total) by mouth every 4 (four) hours as needed for mild pain or fever (or temp > 37.5 C (99.5 F)). 20 tablet 1  . amLODipine (NORVASC) 10 MG tablet TAKE 1 TABLET BY MOUTH EVERY DAY 90 tablet 2  . aspirin EC 81 MG EC tablet Take 1 tablet (81 mg total) by mouth daily. With breakfast 30 tablet 2  . atorvastatin (LIPITOR) 80 MG tablet TAKE 1 TABLET BY MOUTH EVERY DAY IN THE EVENING AT 6PM 30 tablet 5  . carvedilol (COREG) 6.25 MG tablet TAKE 1 TABLET BY MOUTH TWICE A DAY 60 tablet 11  . ELIQUIS 5 MG TABS tablet TAKE 1 TABLET BY MOUTH TWICE A DAY 60 tablet 5  . furosemide (LASIX) 20 MG tablet Take 1 tablet (20 mg total) by mouth daily. 90 tablet 3  . gabapentin (NEURONTIN) 100 MG capsule TAKE 1 CAPSULE BY MOUTH THREE TIMES A DAY 90 capsule 1  . JANUVIA 100 MG tablet TAKE 1 TABLET BY MOUTH EVERY DAY 90 tablet 3  . losartan (COZAAR) 100 MG tablet TAKE  1 TABLET BY MOUTH EVERY DAY 90 tablet 1  . metFORMIN (GLUCOPHAGE) 500 MG tablet Take 2 tablets (1,000 mg total) by mouth 2 (two) times daily with a meal. (Patient taking differently: Take 1,000 mg by mouth daily with breakfast. ) 360 tablet 3  . OVER THE COUNTER MEDICATION Place 1 drop into both eyes daily as needed (dry eyes). Over the counter lubricating eye drop    . potassium chloride SA (K-DUR) 20 MEQ tablet Take 1 tablet (20 mEq total) by mouth 2 (two) times a week. 25 tablet 3   No current facility-administered medications on file prior to visit.    Review of Systems  Constitutional: Negative for other unusual diaphoresis or sweats HENT: Negative for ear discharge or swelling Eyes: Negative for other worsening visual disturbances Respiratory: Negative for stridor or other swelling  Gastrointestinal: Negative for  worsening distension or other blood Genitourinary: Negative for retention or other urinary change Musculoskeletal: Negative for other MSK pain or swelling Skin: Negative for color change or other new lesions Neurological: Negative for worsening tremors and other numbness  Psychiatric/Behavioral: Negative for worsening agitation or other fatigue All otherwise neg per pt     Objective:   Physical Exam BP 138/86   Pulse 89   Temp 98.1 F (36.7 C) (Oral)   Ht 6' (1.829 m)   Wt 208 lb (94.3 kg)   SpO2 98%   BMI 28.21 kg/m  VS noted,  Constitutional: Pt appears in NAD HENT: Head: NCAT.  Right Ear: External ear normal.  Left Ear: External ear normal.  Eyes: . Pupils are equal, round, and reactive to light. Conjunctivae and EOM are normal Nose: without d/c or deformity Neck: Neck supple. Gross normal ROM Cardiovascular: Normal rate and regular rhythm.   Pulmonary/Chest: Effort normal and breath sounds without rales or wheezing.  Abd:  Soft, NT, ND, + BS, no organomegaly Neurological: Pt is alert. At baseline orientation, motor grossly intact Skin: Skin is warm. No rashes, other new lesions, no LE edema Psychiatric: Pt behavior is normal without agitation  All otherwise neg per pt  POCT HgB A1C Order: [id number] Status:  Final result Visible to patient:  No (not released) Dx:  Type 2 diabetes mellitus without comp...  Ref Range & Units 1d ago (08/11/19) 78mo ago (02/02/19) 58yr ago (06/23/18) 55yr ago (04/02/18) 6yr ago (12/19/17) 49yr ago (08/19/17) 60yr ago (05/10/17)  Hemoglobin A1C 4.0 - 5.6 % 8.2Abnormal   11.6High  R, CM  6.1High  R, CM  6.4High  R, CM  6.4 R  6.4 R  8.8 R             Assessment & Plan:

## 2019-08-15 ENCOUNTER — Encounter: Payer: Self-pay | Admitting: Internal Medicine

## 2019-08-15 NOTE — Assessment & Plan Note (Signed)
Severe uncontrolled due to diet non compliance, now plans to be compliant and refuses other increased med tx or diet referral; does need new meter and supplies sent

## 2019-08-15 NOTE — Assessment & Plan Note (Signed)
stable overall by history and exam, recent data reviewed with pt, and pt to continue medical treatment as before,  to f/u any worsening symptoms or concerns  

## 2019-09-05 ENCOUNTER — Other Ambulatory Visit (HOSPITAL_COMMUNITY): Payer: Self-pay | Admitting: Internal Medicine

## 2019-09-07 NOTE — Telephone Encounter (Signed)
Age 83, weight 94kg, SCr 1.04 on 02/02/19, last OV June 2020, afib indication

## 2019-09-25 ENCOUNTER — Other Ambulatory Visit: Payer: Self-pay | Admitting: Cardiovascular Disease

## 2019-09-30 DIAGNOSIS — N529 Male erectile dysfunction, unspecified: Secondary | ICD-10-CM | POA: Diagnosis not present

## 2019-09-30 DIAGNOSIS — Z7901 Long term (current) use of anticoagulants: Secondary | ICD-10-CM | POA: Diagnosis not present

## 2019-09-30 DIAGNOSIS — Z008 Encounter for other general examination: Secondary | ICD-10-CM | POA: Diagnosis not present

## 2019-09-30 DIAGNOSIS — Z833 Family history of diabetes mellitus: Secondary | ICD-10-CM | POA: Diagnosis not present

## 2019-09-30 DIAGNOSIS — E1142 Type 2 diabetes mellitus with diabetic polyneuropathy: Secondary | ICD-10-CM | POA: Diagnosis not present

## 2019-09-30 DIAGNOSIS — R609 Edema, unspecified: Secondary | ICD-10-CM | POA: Diagnosis not present

## 2019-09-30 DIAGNOSIS — M1990 Unspecified osteoarthritis, unspecified site: Secondary | ICD-10-CM | POA: Diagnosis not present

## 2019-09-30 DIAGNOSIS — Z8249 Family history of ischemic heart disease and other diseases of the circulatory system: Secondary | ICD-10-CM | POA: Diagnosis not present

## 2019-09-30 DIAGNOSIS — I10 Essential (primary) hypertension: Secondary | ICD-10-CM | POA: Diagnosis not present

## 2019-09-30 DIAGNOSIS — Z7984 Long term (current) use of oral hypoglycemic drugs: Secondary | ICD-10-CM | POA: Diagnosis not present

## 2019-09-30 DIAGNOSIS — E785 Hyperlipidemia, unspecified: Secondary | ICD-10-CM | POA: Diagnosis not present

## 2019-10-04 ENCOUNTER — Other Ambulatory Visit: Payer: Self-pay | Admitting: Internal Medicine

## 2019-10-11 ENCOUNTER — Ambulatory Visit: Payer: Medicare HMO

## 2019-10-14 ENCOUNTER — Other Ambulatory Visit: Payer: Self-pay | Admitting: Internal Medicine

## 2019-10-18 ENCOUNTER — Ambulatory Visit: Payer: Medicare HMO

## 2019-10-20 ENCOUNTER — Ambulatory Visit (INDEPENDENT_AMBULATORY_CARE_PROVIDER_SITE_OTHER): Payer: Medicare HMO | Admitting: Internal Medicine

## 2019-10-20 ENCOUNTER — Other Ambulatory Visit: Payer: Self-pay

## 2019-10-20 ENCOUNTER — Encounter: Payer: Self-pay | Admitting: Internal Medicine

## 2019-10-20 DIAGNOSIS — M5416 Radiculopathy, lumbar region: Secondary | ICD-10-CM | POA: Diagnosis not present

## 2019-10-20 DIAGNOSIS — N521 Erectile dysfunction due to diseases classified elsewhere: Secondary | ICD-10-CM

## 2019-10-20 DIAGNOSIS — E1165 Type 2 diabetes mellitus with hyperglycemia: Secondary | ICD-10-CM

## 2019-10-20 DIAGNOSIS — I10 Essential (primary) hypertension: Secondary | ICD-10-CM

## 2019-10-20 DIAGNOSIS — IMO0002 Reserved for concepts with insufficient information to code with codable children: Secondary | ICD-10-CM

## 2019-10-20 DIAGNOSIS — E1159 Type 2 diabetes mellitus with other circulatory complications: Secondary | ICD-10-CM | POA: Diagnosis not present

## 2019-10-20 MED ORDER — PREDNISONE 10 MG PO TABS: ORAL_TABLET | ORAL | 0 refills | Status: DC

## 2019-10-20 MED ORDER — TRAMADOL HCL 50 MG PO TABS: 50.0000 mg | ORAL_TABLET | Freq: Four times a day (QID) | ORAL | 0 refills | Status: DC | PRN

## 2019-10-20 NOTE — Patient Instructions (Signed)
Please take all new medication as prescribed - the pain medication as needed, and the prednisone  Please continue all other medications as before, and refills have been done if requested.  Please have the pharmacy call with any other refills you may need.  Please continue your efforts at being more active, low cholesterol diet, and weight control.  Please keep your appointments with your specialists as you may have planned

## 2019-10-20 NOTE — Progress Notes (Signed)
Subjective:    Patient ID: Brian [name], male    DOB: 01-11-1936, 84 y.o.   MRN: [id number]  HPI  Here with 1 wk onset mod right LBP with radicular pain with pain, numbness and mild weakness to RLE with falls x 2; feels like before he had his lumbar surgury for similar about 20 yrs ago; Pt denies bowel or bladder change, fever, wt loss, gait change or falls. Pt denies chest pain, increased sob or doe, wheezing, orthopnea, PND, increased LE swelling, palpitations, dizziness or syncope.   Pt denies polydipsia, polyuria Past Medical History:  Diagnosis Date  . ALLERGIC RHINITIS 10/13/2007  . ANEMIA, CHRONIC DISEASE NEC 03/31/2007  . BENIGN PROSTATIC HYPERTROPHY 03/31/2007  . Chest pain    Stress echo, normal, December, 2012  . Chronic anemia   . DIABETES MELLITUS, TYPE II 03/31/2007  . [place] DISEASE, LUMBAR 03/31/2007  . ED (erectile dysfunction)   . Ejection fraction    EF  normal, stress echo, December,  2012  . History of prostatitis   . HYPERLIPIDEMIA 03/31/2007  . HYPERTENSION 03/31/2007  . Nephrolithiasis 12/07/2010  . Obesity   . POLYP, ANAL AND RECTAL 03/31/2007  . Right eye trauma    hx as child  . Stroke [facility])    Past Surgical History:  Procedure Laterality Date  . [place] SURGERY     1999  . PROSTATE BIOPSY     s/p  . RECTAL POLYPECTOMY     Transanal excision 10/2005  . SKIN BIOPSY     s/p right upper back 2009- benign Dr. Tonia Brooms    reports that he quit smoking about 26 years ago. He quit after 10.00 years of use. He has never used smokeless tobacco. He reports current alcohol use of about 1.0 standard drinks of alcohol per week. He reports that he does not use drugs. family history includes Colon polyps in his mother; Stroke in his mother. No Known Allergies Current Outpatient Medications on File Prior to Visit  Medication Sig Dispense Refill  . acetaminophen (TYLENOL) 325 MG tablet Take 2 tablets (650 mg total) by mouth every 4 (four) hours as needed for mild pain or  fever (or temp > 37.5 C (99.5 F)). 20 tablet 1  . amLODipine (NORVASC) 10 MG tablet TAKE 1 TABLET BY MOUTH EVERY DAY 90 tablet 2  . aspirin EC 81 MG EC tablet Take 1 tablet (81 mg total) by mouth daily. With breakfast 30 tablet 2  . atorvastatin (LIPITOR) 80 MG tablet TAKE 1 TABLET BY MOUTH EVERY DAY IN THE EVENING AT 6PM 30 tablet 5  . carvedilol (COREG) 6.25 MG tablet TAKE 1 TABLET BY MOUTH TWICE A DAY 60 tablet 11  . ELIQUIS 5 MG TABS tablet TAKE 1 TABLET BY MOUTH TWICE A DAY 60 tablet 5  . furosemide (LASIX) 20 MG tablet Take 1 tablet (20 mg total) by mouth daily. 90 tablet 3  . gabapentin (NEURONTIN) 100 MG capsule TAKE 1 CAPSULE BY MOUTH THREE TIMES A DAY 90 capsule 1  . glucose blood (ONETOUCH VERIO) test strip Use as directed twice daily E11.9 200 each 12  . JANUVIA 100 MG tablet TAKE 1 TABLET BY MOUTH EVERY DAY 90 tablet 1  . Lancets MISC Use as directed twice daily E11.9 200 each 3  . losartan (COZAAR) 100 MG tablet TAKE 1 TABLET BY MOUTH EVERY DAY 90 tablet 1  . metFORMIN (GLUCOPHAGE) 500 MG tablet Take 2 tablets (1,000 mg total) by mouth 2 (  two) times daily with a meal. (Patient taking differently: Take 1,000 mg by mouth daily with breakfast. ) 360 tablet 3  . OVER THE COUNTER MEDICATION Place 1 drop into both eyes daily as needed (dry eyes). Over the counter lubricating eye drop    . potassium chloride SA (K-DUR) 20 MEQ tablet Take 1 tablet (20 mEq total) by mouth 2 (two) times a week. 25 tablet 3  . Blood Glucose Monitoring Suppl (ONETOUCH VERIO) w/Device KIT Use as directed twice daily E11.9 1 kit 0   No current facility-administered medications on file prior to visit.   Review of Systems ,All otherwise neg per pt     Objective:   Physical Exam BP (!) 148/70   Pulse 64   Temp 98 F (36.7 C)   Ht 6' (1.829 m)   Wt 208 lb (94.3 kg)   SpO2 98%   BMI 28.21 kg/m  VS noted,  Constitutional: Pt appears in NAD HENT: Head: NCAT.  Right Ear: External ear normal.  Left Ear:  External ear normal.  Eyes: . Pupils are equal, round, and reactive to light. Conjunctivae and EOM are normal Nose: without d/c or deformity Neck: Neck supple. Gross normal ROM Cardiovascular: Normal rate and regular rhythm.   Pulmonary/Chest: Effort normal and breath sounds without rales or wheezing.  Abd:  Soft, NT, ND, + BS, no organomegaly Neurological: Pt is alert. At baseline orientation, motor grossly intact Skin: Skin is warm. No rashes, other new lesions, no LE edema Psychiatric: Pt behavior is normal without agitation  All otherwise neg per pt Lab Results  Component Value Date   WBC 4.6 02/02/2019   HGB 12.0 (L) 02/02/2019   HCT 36.2 (L) 02/02/2019   PLT 187.0 02/02/2019   GLUCOSE 420 (H) 02/02/2019   CHOL 118 02/02/2019   TRIG 74.0 02/02/2019   HDL 48.80 02/02/2019   LDLCALC 54 02/02/2019   ALT 16 02/02/2019   AST 13 02/02/2019   NA 137 02/02/2019   K 4.4 02/02/2019   CL 99 02/02/2019   CREATININE 1.04 02/02/2019   BUN 13 02/02/2019   CO2 31 02/02/2019   TSH 1.78 02/02/2019   PSA 2.64 07/18/2017   INR 1.19 06/22/2018   HGBA1C 8.2 (A) 08/11/2019   MICROALBUR <0.7 02/02/2019      Assessment & Plan:

## 2019-10-20 NOTE — Assessment & Plan Note (Signed)
stable overall by history and exam, recent data reviewed with pt, and pt to continue medical treatment as before,  to f/u any worsening symptoms or concerns  

## 2019-10-20 NOTE — Assessment & Plan Note (Addendum)
Mod to severe, for tramadol, predpac asd, and MRI if persists  I spent 30 minutes preparing to see the patient by review of recent labs, imaging and procedures, obtaining and reviewing separately obtained history, communicating with the patient and family or caregiver, ordering medications, tests or procedures, and documenting clinical information in the EHR including the differential Dx, treatment, and any further evaluation and other management of right lumbar radiculopathy, HTN, DM

## 2019-10-21 ENCOUNTER — Other Ambulatory Visit: Payer: Self-pay | Admitting: Family Medicine

## 2019-10-23 ENCOUNTER — Ambulatory Visit: Payer: Medicare HMO

## 2019-11-02 NOTE — Telephone Encounter (Signed)
° ° ° ° ° ° °  1. Which medications need to be refilled? (please list name of each medication and dose if known) gabapentin (NEURONTIN) 100 MG capsule  2. Which pharmacy/location (including street and city if local pharmacy) is medication to be sent to?[id number] [place], [place] - [address]  3. Do they need a 30 day or 90 day supply? [place]

## 2019-11-03 NOTE — Telephone Encounter (Signed)
Reviewed chart pt is over due will need appt for refills.Marland KitchenJohny [name]

## 2019-11-12 ENCOUNTER — Other Ambulatory Visit: Payer: Self-pay | Admitting: Internal Medicine

## 2019-11-12 ENCOUNTER — Ambulatory Visit (INDEPENDENT_AMBULATORY_CARE_PROVIDER_SITE_OTHER): Payer: Medicare HMO | Admitting: Internal Medicine

## 2019-11-12 ENCOUNTER — Other Ambulatory Visit: Payer: Self-pay

## 2019-11-12 ENCOUNTER — Encounter: Payer: Self-pay | Admitting: Internal Medicine

## 2019-11-12 VITALS — BP 144/80 | HR 60 | Temp 97.6°F | Ht 72.0 in | Wt 200.4 lb

## 2019-11-12 DIAGNOSIS — E1165 Type 2 diabetes mellitus with hyperglycemia: Secondary | ICD-10-CM

## 2019-11-12 DIAGNOSIS — N521 Erectile dysfunction due to diseases classified elsewhere: Secondary | ICD-10-CM

## 2019-11-12 DIAGNOSIS — Z0000 Encounter for general adult medical examination without abnormal findings: Secondary | ICD-10-CM | POA: Diagnosis not present

## 2019-11-12 DIAGNOSIS — E538 Deficiency of other specified B group vitamins: Secondary | ICD-10-CM

## 2019-11-12 DIAGNOSIS — E1159 Type 2 diabetes mellitus with other circulatory complications: Secondary | ICD-10-CM

## 2019-11-12 DIAGNOSIS — E559 Vitamin D deficiency, unspecified: Secondary | ICD-10-CM | POA: Diagnosis not present

## 2019-11-12 DIAGNOSIS — IMO0002 Reserved for concepts with insufficient information to code with codable children: Secondary | ICD-10-CM

## 2019-11-12 LAB — CBC WITH DIFFERENTIAL/PLATELET
Basophils Absolute: 0.1 10*3/uL (ref 0.0–0.1)
Basophils Relative: 1.6 % (ref 0.0–3.0)
Eosinophils Absolute: 0.1 10*3/uL (ref 0.0–0.7)
Eosinophils Relative: 2 % (ref 0.0–5.0)
HCT: 40.8 % (ref 39.0–52.0)
Hemoglobin: 13.1 g/dL (ref 13.0–17.0)
Lymphocytes Relative: 31 % (ref 12.0–46.0)
Lymphs Abs: 1 10*3/uL (ref 0.7–4.0)
MCHC: 32.2 g/dL (ref 30.0–36.0)
MCV: 82.2 fl (ref 78.0–100.0)
Monocytes Absolute: 0.4 10*3/uL (ref 0.1–1.0)
Monocytes Relative: 11.1 % (ref 3.0–12.0)
Neutro Abs: 1.8 10*3/uL (ref 1.4–7.7)
Neutrophils Relative %: 54.3 % (ref 43.0–77.0)
Platelets: 173 10*3/uL (ref 150.0–400.0)
RBC: 4.96 Mil/uL (ref 4.22–5.81)
RDW: 14.1 % (ref 11.5–15.5)
WBC: 3.3 10*3/uL — ABNORMAL LOW (ref 4.0–10.5)

## 2019-11-12 LAB — HEPATIC FUNCTION PANEL
ALT: 23 U/L (ref 0–53)
AST: 18 U/L (ref 0–37)
Albumin: 4.6 g/dL (ref 3.5–5.2)
Alkaline Phosphatase: 75 U/L (ref 39–117)
Bilirubin, Direct: 0.1 mg/dL (ref 0.0–0.3)
Total Bilirubin: 0.9 mg/dL (ref 0.2–1.2)
Total Protein: 7.8 g/dL (ref 6.0–8.3)

## 2019-11-12 LAB — BASIC METABOLIC PANEL
BUN: 14 mg/dL (ref 6–23)
CO2: 31 mEq/L (ref 19–32)
Calcium: 10.2 mg/dL (ref 8.4–10.5)
Chloride: 98 mEq/L (ref 96–112)
Creatinine, Ser: 1.04 mg/dL (ref 0.40–1.50)
GFR: 82.75 mL/min (ref >60.00)
Glucose, Bld: 353 mg/dL — ABNORMAL HIGH (ref 70–99)
Potassium: 4.4 mEq/L (ref 3.5–5.1)
Sodium: 135 mEq/L (ref 135–145)

## 2019-11-12 LAB — MICROALBUMIN / CREATININE URINE RATIO
Creatinine,U: 21 mg/dL
Microalb Creat Ratio: 3.3 mg/g (ref 0.0–30.0)
Microalb, Ur: <0.7 mg/dL (ref 0.0–1.9)

## 2019-11-12 LAB — LIPID PANEL
Cholesterol: 171 mg/dL (ref 0–200)
HDL: 50.7 mg/dL (ref >39.00)
LDL Cholesterol: 100 mg/dL — ABNORMAL HIGH (ref 0–99)
NonHDL: 120.57
Total CHOL/HDL Ratio: 3
Triglycerides: 105 mg/dL (ref 0.0–149.0)
VLDL: 21 mg/dL (ref 0.0–40.0)

## 2019-11-12 LAB — VITAMIN D 25 HYDROXY (VIT D DEFICIENCY, FRACTURES): VITD: 10.76 ng/mL — ABNORMAL LOW (ref 30.00–100.00)

## 2019-11-12 LAB — TSH: TSH: 1.02 u[IU]/mL (ref 0.35–4.50)

## 2019-11-12 LAB — VITAMIN B12: Vitamin B-12: 413 pg/mL (ref 211–911)

## 2019-11-12 MED ORDER — GABAPENTIN 100 MG PO CAPS: ORAL_CAPSULE | ORAL | 1 refills | Status: DC

## 2019-11-12 NOTE — Assessment & Plan Note (Signed)

## 2019-11-12 NOTE — Assessment & Plan Note (Signed)
stable overall by history and exam, recent data reviewed with pt, and pt to continue medical treatment as before,  to f/u any worsening symptoms or concerns  

## 2019-11-12 NOTE — Patient Instructions (Signed)
Please continue all other medications as before, and refills have been done if requested.  Please have the pharmacy call with any other refills you may need.  Please continue your efforts at being more active, low cholesterol diet, and weight control.  You are otherwise up to date with prevention measures today.  Please keep your appointments with your specialists as you may have planned  You will be contacted regarding the referral for: Endocrinology  - Dr Cruzita Lederer  Please go to the LAB at the blood drawing area for the tests to be done  You will be contacted by phone if any changes need to be made immediately.  Otherwise, you will receive a letter about your results with an explanation, but please check with MyChart first.  Please remember to sign up for MyChart if you have not done so, as this will be important to you in the future with finding out test results, communicating by private email, and scheduling acute appointments online when needed.  Please make an Appointment to return in 6 months, or sooner if needed

## 2019-11-12 NOTE — Progress Notes (Signed)
Subjective:    Patient ID: Brian [name], male    DOB: [birth year], 84 y.o.   MRN: [id number]  HPI  Here for wellness and f/u;  Overall doing ok;  Pt denies Chest pain, worsening SOB, DOE, wheezing, orthopnea, PND, worsening LE edema, palpitations, dizziness or syncope.  Pt denies neurological change such as new headache, facial or extremity weakness.  Pt denies polydipsia, polyuria, or low sugar symptoms. Pt states overall good compliance with treatment and medications, good tolerability, and has been trying to follow appropriate diet.  Pt denies worsening depressive symptoms, suicidal ideation or panic. No fever, night sweats, wt loss, loss of appetite, or other constitutional symptoms.  Pt states good ability with ADL's, has low fall risk, home safety reviewed and adequate, no other significant changes in hearing or vision, and only occasionally active with exercise.  Did well with tramadol and prednisone, now pain resolved. COVID x 2 done.   Past Medical History:  Diagnosis Date  . ALLERGIC RHINITIS 10/13/2007  . ANEMIA, CHRONIC DISEASE NEC 03/31/2007  . BENIGN PROSTATIC HYPERTROPHY 03/31/2007  . Chest pain    Stress echo, normal, December, 2012  . Chronic anemia   . DIABETES MELLITUS, TYPE II 03/31/2007  . [place] DISEASE, LUMBAR 03/31/2007  . ED (erectile dysfunction)   . Ejection fraction    EF  normal, stress echo, December,  2012  . History of prostatitis   . HYPERLIPIDEMIA 03/31/2007  . HYPERTENSION 03/31/2007  . Nephrolithiasis 12/07/2010  . Obesity   . POLYP, ANAL AND RECTAL 03/31/2007  . Right eye trauma    hx as child  . Stroke [facility])    Past Surgical History:  Procedure Laterality Date  . [place] SURGERY     1999  . PROSTATE BIOPSY     s/p  . RECTAL POLYPECTOMY     Transanal excision 10/2005  . SKIN BIOPSY     s/p right upper back 2009- benign Dr. Tonia Brooms    reports that he quit smoking about 26 years ago. He quit after 10.00 years of use. He has never used smokeless  tobacco. He reports current alcohol use of about 1.0 standard drinks of alcohol per week. He reports that he does not use drugs. family history includes Colon polyps in his mother; Stroke in his mother. No Known Allergies Current Outpatient Medications on File Prior to Visit  Medication Sig Dispense Refill  . acetaminophen (TYLENOL) 325 MG tablet Take 2 tablets (650 mg total) by mouth every 4 (four) hours as needed for mild pain or fever (or temp > 37.5 C (99.5 F)). 20 tablet 1  . amLODipine (NORVASC) 10 MG tablet TAKE 1 TABLET BY MOUTH EVERY DAY 90 tablet 2  . aspirin EC 81 MG EC tablet Take 1 tablet (81 mg total) by mouth daily. With breakfast 30 tablet 2  . Blood Glucose Monitoring Suppl (ONETOUCH VERIO) w/Device KIT Use as directed twice daily E11.9 1 kit 0  . carvedilol (COREG) 6.25 MG tablet TAKE 1 TABLET BY MOUTH TWICE A DAY 60 tablet 11  . ELIQUIS 5 MG TABS tablet TAKE 1 TABLET BY MOUTH TWICE A DAY 60 tablet 5  . furosemide (LASIX) 20 MG tablet Take 1 tablet (20 mg total) by mouth daily. 90 tablet 3  . gabapentin (NEURONTIN) 100 MG capsule TAKE 1 CAPSULE BY MOUTH THREE TIMES A DAY 90 capsule 1  . glucose blood (ONETOUCH VERIO) test strip Use as directed twice daily E11.9 200 each 12  .  JANUVIA 100 MG tablet TAKE 1 TABLET BY MOUTH EVERY DAY 90 tablet 1  . Lancets MISC Use as directed twice daily E11.9 200 each 3  . losartan (COZAAR) 100 MG tablet TAKE 1 TABLET BY MOUTH EVERY DAY 90 tablet 1  . metFORMIN (GLUCOPHAGE) 500 MG tablet Take 2 tablets (1,000 mg total) by mouth 2 (two) times daily with a meal. (Patient taking differently: Take 1,000 mg by mouth daily with breakfast. ) 360 tablet 3  . OVER THE COUNTER MEDICATION Place 1 drop into both eyes daily as needed (dry eyes). Over the counter lubricating eye drop    . potassium chloride SA (K-DUR) 20 MEQ tablet Take 1 tablet (20 mEq total) by mouth 2 (two) times a week. 25 tablet 3  . predniSONE (DELTASONE) 10 MG tablet 3 tabs by mouth per  day for 3 days,2tabs per day for 3 days,1tab per day for 3 days 18 tablet 0  . traMADol (ULTRAM) 50 MG tablet Take 1 tablet (50 mg total) by mouth every 6 (six) hours as needed. 30 tablet 0   No current facility-administered medications on file prior to visit.  ROS" All otherwise neg per pt     Objective:   Physical Exam  BP (!) 144/80   Pulse 60   Temp 97.6 F (36.4 C)   Ht 6' (1.829 m)   Wt 200 lb 6.4 oz (90.9 kg)   SpO2 100%   BMI 27.18 kg/m  VS noted,  Constitutional: Pt appears in NAD HENT: Head: NCAT.  Right Ear: External ear normal.  Left Ear: External ear normal.  Eyes: . Pupils are equal, round, and reactive to light. Conjunctivae and EOM are normal Nose: without d/c or deformity Neck: Neck supple. Gross normal ROM Cardiovascular: Normal rate and regular rhythm.   Pulmonary/Chest: Effort normal and breath sounds without rales or wheezing.  Abd:  Soft, NT, ND, + BS, no organomegaly Neurological: Pt is alert. At baseline orientation, motor grossly intact Skin: Skin is warm. No rashes, other new lesions, no LE edema Psychiatric: Pt behavior is normal without agitation  All otherwise neg per pt Lab Results  Component Value Date   WBC 4.6 02/02/2019   HGB 12.0 (L) 02/02/2019   HCT 36.2 (L) 02/02/2019   PLT 187.0 02/02/2019   GLUCOSE 420 (H) 02/02/2019   CHOL 118 02/02/2019   TRIG 74.0 02/02/2019   HDL 48.80 02/02/2019   LDLCALC 54 02/02/2019   ALT 16 02/02/2019   AST 13 02/02/2019   NA 137 02/02/2019   K 4.4 02/02/2019   CL 99 02/02/2019   CREATININE 1.04 02/02/2019   BUN 13 02/02/2019   CO2 31 02/02/2019   TSH 1.78 02/02/2019   PSA 2.64 07/18/2017   INR 1.19 06/22/2018   HGBA1C 8.2 (A) 08/11/2019   MICROALBUR <0.7 02/02/2019      Assessment & Plan:

## 2019-11-13 LAB — URINALYSIS, ROUTINE W REFLEX MICROSCOPIC
Bilirubin Urine: NEGATIVE
Hgb urine dipstick: NEGATIVE
Ketones, ur: NEGATIVE
Leukocytes,Ua: NEGATIVE
Nitrite: NEGATIVE
Specific Gravity, Urine: 1.015 (ref 1.000–1.030)
Total Protein, Urine: NEGATIVE
Urine Glucose: >=1000 — AB
Urobilinogen, UA: 0.2 (ref 0.0–1.0)
WBC, UA: NONE SEEN (ref 0–?)
pH: 6 (ref 5.0–8.0)

## 2019-11-13 LAB — HEMOGLOBIN A1C: Hgb A1c MFr Bld: 9.8 % — ABNORMAL HIGH (ref 4.6–6.5)

## 2019-11-14 ENCOUNTER — Encounter: Payer: Self-pay | Admitting: Internal Medicine

## 2019-11-14 ENCOUNTER — Other Ambulatory Visit: Payer: Self-pay | Admitting: Internal Medicine

## 2019-11-14 MED ORDER — VITAMIN D (ERGOCALCIFEROL) 1.25 MG (50000 UNIT) PO CAPS: 50000.0000 [IU] | ORAL_CAPSULE | ORAL | 0 refills | Status: DC

## 2019-11-14 MED ORDER — PIOGLITAZONE HCL 15 MG PO TABS: 15.0000 mg | ORAL_TABLET | Freq: Every day | ORAL | 3 refills | Status: DC

## 2019-12-02 ENCOUNTER — Other Ambulatory Visit: Payer: Self-pay | Admitting: Cardiovascular Disease

## 2019-12-07 ENCOUNTER — Ambulatory Visit: Payer: Medicare HMO | Admitting: Internal Medicine

## 2019-12-07 ENCOUNTER — Other Ambulatory Visit: Payer: Self-pay

## 2019-12-07 ENCOUNTER — Encounter: Payer: Self-pay | Admitting: Internal Medicine

## 2019-12-07 VITALS — BP 160/88 | HR 69 | Ht 72.0 in | Wt 203.0 lb

## 2019-12-07 DIAGNOSIS — I480 Paroxysmal atrial fibrillation: Secondary | ICD-10-CM | POA: Diagnosis not present

## 2019-12-07 NOTE — Progress Notes (Signed)
Cardiology Office Note   Date:  12/07/2019   ID:  CALI CUARTAS, DOB [birth year], MRN [id number]  PCP:  [name], MD  Cardiologist:   Dorris Carnes, MD    F/u of HTN and PAF     History of Present Illness: Brian TOMPSON is a 84 y.o. male with a history of DM, HTN, CVA (7/19, 10/19), PAF (CHADSVASc 6)   I last saw him virtually in June 2020  Since seen he has done OK  He says his breathing is OK  He denies CP    Rare palpitations    No new neuro complaints   Says he is taking Eliquis 2x per day Does not take BP at home        Current Meds  Medication Sig  . acetaminophen (TYLENOL) 325 MG tablet Take 2 tablets (650 mg total) by mouth every 4 (four) hours as needed for mild pain or fever (or temp > 37.5 C (99.5 F)).  [name] amLODipine (NORVASC) 10 MG tablet TAKE 1 TABLET BY MOUTH EVERY DAY  . aspirin EC 81 MG EC tablet Take 1 tablet (81 mg total) by mouth daily. With breakfast  . atorvastatin (LIPITOR) 80 MG tablet TAKE 1 TABLET BY MOUTH EVERY DAY IN THE EVENING AT 6PM  . Blood Glucose Monitoring Suppl (ONETOUCH VERIO) w/Device KIT Use as directed twice daily E11.9  . carvedilol (COREG) 6.25 MG tablet TAKE 1 TABLET BY MOUTH TWICE A DAY  . ELIQUIS 5 MG TABS tablet TAKE 1 TABLET BY MOUTH TWICE A DAY  . furosemide (LASIX) 20 MG tablet Take 1 tablet (20 mg total) by mouth daily.  [name] gabapentin (NEURONTIN) 100 MG capsule TAKE 1 CAPSULE BY MOUTH THREE TIMES A DAY  . glucose blood (ONETOUCH VERIO) test strip Use as directed twice daily E11.9  . JANUVIA 100 MG tablet TAKE 1 TABLET BY MOUTH EVERY DAY  . Lancets MISC Use as directed twice daily E11.9  . losartan (COZAAR) 100 MG tablet TAKE 1 TABLET BY MOUTH EVERY DAY  . metFORMIN (GLUCOPHAGE) 500 MG tablet Take 2 tablets (1,000 mg total) by mouth 2 (two) times daily with a meal. (Patient taking differently: Take 1,000 mg by mouth daily with breakfast. )  . OVER THE COUNTER MEDICATION Place 1 drop into both eyes daily as needed (dry eyes).  Over the counter lubricating eye drop  . pioglitazone (ACTOS) 15 MG tablet Take 1 tablet (15 mg total) by mouth daily.  . potassium chloride SA (K-DUR) 20 MEQ tablet Take 1 tablet (20 mEq total) by mouth 2 (two) times a week.  . predniSONE (DELTASONE) 10 MG tablet 3 tabs by mouth per day for 3 days,2tabs per day for 3 days,1tab per day for 3 days  . traMADol (ULTRAM) 50 MG tablet Take 1 tablet (50 mg total) by mouth every 6 (six) hours as needed.  . Vitamin D, Ergocalciferol, (DRISDOL) 1.25 MG (50000 UNIT) CAPS capsule Take 1 capsule (50,000 Units total) by mouth every 7 (seven) days.     Allergies:   Patient has no known allergies.   Past Medical History:  Diagnosis Date  . ALLERGIC RHINITIS 10/13/2007  . ANEMIA, CHRONIC DISEASE NEC 03/31/2007  . BENIGN PROSTATIC HYPERTROPHY 03/31/2007  . Chest pain    Stress echo, normal, December, 2012  . Chronic anemia   . DIABETES MELLITUS, TYPE II 03/31/2007  . [place] DISEASE, LUMBAR 03/31/2007  . ED (erectile dysfunction)   . Ejection fraction  EF  normal, stress echo, December,  2012  . History of prostatitis   . HYPERLIPIDEMIA 03/31/2007  . HYPERTENSION 03/31/2007  . Nephrolithiasis 12/07/2010  . Obesity   . POLYP, ANAL AND RECTAL 03/31/2007  . Right eye trauma    hx as child  . Stroke [facility])     Past Surgical History:  Procedure Laterality Date  . [place] SURGERY     1999  . PROSTATE BIOPSY     s/p  . RECTAL POLYPECTOMY     Transanal excision 10/2005  . SKIN BIOPSY     s/p right upper back 2009- benign Dr. Tonia Brooms     Social History:  The patient  reports that he quit smoking about 26 years ago. He quit after 10.00 years of use. He has never used smokeless tobacco. He reports current alcohol use of about 1.0 standard drinks of alcohol per week. He reports that he does not use drugs.   Family History:  The patient's family history includes Colon polyps in his mother; Stroke in his mother.    ROS:  Please see the history of  present illness. All other systems are reviewed and  Negative to the above problem except as noted.    PHYSICAL EXAM: VS:  BP (!) 160/88   Pulse 69   Ht 6' (1.829 m)   Wt 203 lb (92.1 kg)   BMI 27.53 kg/m   GEN: Well nourished, well developed, in no acute distress  HEENT: normal  Neck: no JVD, No bruits  Cardiac: RRR; no murmurs, rubs, or gallops,no LE edema  Respiratory:  clear to auscultation bilaterally, normal work of breathing GI: soft, nontender, nondistended, + BS  No hepatomegaly  MS: no deformity Moving all extremities   Skin: warm and dry, no rash Neuro:  Strength and sensation are intact Psych: euthymic mood, full affect   EKG:  EKG is ordered today.  SR 70 bpm  First degree AV block  PR 212 msec    Lipid Panel    Component Value Date/Time   CHOL 171 11/12/2019 1406   CHOL 141 10/09/2018 1328   TRIG 105.0 11/12/2019 1406   HDL 50.70 11/12/2019 1406   HDL 57 10/09/2018 1328   CHOLHDL 3 11/12/2019 1406   VLDL 21.0 11/12/2019 1406   LDLCALC 100 (H) 11/12/2019 1406   LDLCALC 69 10/09/2018 1328      Wt Readings from Last 3 Encounters:  12/07/19 203 lb (92.1 kg)  11/12/19 200 lb 6.4 oz (90.9 kg)  10/20/19 208 lb (94.3 kg)      ASSESSMENT AND PLAN:  1.  HTN.  BP is up     I have asked him to follow   He has appt with Dr Cruzita Lederer in April   I have asked him t obring cuff to that visit  To see if agrees   He should take at home    Appt in end of May/June to eval BP  2  HL   He says his diet has not been as good   Says he is takng meds    Will set up for lipids later this spring   Keep on current regimen     3 Hx PAF   Rare skips  Keep on Eliuqis     4  Hx CVA  Continue anticoag and lipid agents     5 DM  Has appt with Gherghe in April      Current medicines are reviewed at length  with the patient today.  The patient does not have concerns regarding medicines.  Signed, Dorris Carnes, MD  12/07/2019 3:52 PM    [facility] [address], [place], [place]  [address] Phone: [phone]; Fax: [phone]

## 2019-12-07 NOTE — Patient Instructions (Signed)
Medication Instructions:  No changes today *If you need a refill on your cardiac medications before your next appointment, please call your pharmacy*   Lab Work: None today If you have labs (blood work) drawn today and your tests are completely normal, you will receive your results only by: [name] MyChart Message (if you have MyChart) OR . A paper copy in the mail If you have any lab test that is abnormal or we need to change your treatment, we will call you to review the results.   Testing/Procedures: none   Follow-Up: At [facility], you and your health needs are our priority.  As part of our continuing mission to provide you with exceptional heart care, we have created designated Provider Care Teams.  These Care Teams include your primary Cardiologist (physician) and Advanced Practice Providers (APPs -  Physician Assistants and Nurse Practitioners) who all work together to provide you with the care you need, when you need it.  We recommend signing up for the patient portal called "MyChart".  Sign up information is provided on this After Visit Summary.  MyChart is used to connect with patients for Virtual Visits (Telemedicine).  Patients are able to view lab/test results, encounter notes, upcoming appointments, etc.  Non-urgent messages can be sent to your provider as well.   To learn more about what you can do with MyChart, go to [email or web address].    Your next appointment:   2-3 month(s)  The format for your next appointment:   In Person  Provider:   You may see Dorris Carnes, MD or one of the following Advanced Practice Providers on your designated Care Team:    Richardson Dopp, PA-C  [place], [place]  Daune Perch, NP    Other Instructions We will get labs the day you come in for next visit. Please bring blood pressure cuff and list of readings that day.

## 2019-12-25 ENCOUNTER — Other Ambulatory Visit: Payer: Self-pay

## 2019-12-25 ENCOUNTER — Other Ambulatory Visit: Payer: Self-pay | Admitting: Internal Medicine

## 2019-12-25 ENCOUNTER — Ambulatory Visit (INDEPENDENT_AMBULATORY_CARE_PROVIDER_SITE_OTHER): Payer: Medicare HMO | Admitting: Internal Medicine

## 2019-12-25 ENCOUNTER — Encounter: Payer: Self-pay | Admitting: Internal Medicine

## 2019-12-25 VITALS — BP 140/92 | HR 102 | Ht 72.0 in | Wt 201.0 lb

## 2019-12-25 DIAGNOSIS — N521 Erectile dysfunction due to diseases classified elsewhere: Secondary | ICD-10-CM | POA: Diagnosis not present

## 2019-12-25 DIAGNOSIS — E1165 Type 2 diabetes mellitus with hyperglycemia: Secondary | ICD-10-CM | POA: Diagnosis not present

## 2019-12-25 DIAGNOSIS — E663 Overweight: Secondary | ICD-10-CM | POA: Diagnosis not present

## 2019-12-25 DIAGNOSIS — E782 Mixed hyperlipidemia: Secondary | ICD-10-CM

## 2019-12-25 DIAGNOSIS — IMO0002 Reserved for concepts with insufficient information to code with codable children: Secondary | ICD-10-CM

## 2019-12-25 DIAGNOSIS — E1159 Type 2 diabetes mellitus with other circulatory complications: Secondary | ICD-10-CM | POA: Diagnosis not present

## 2019-12-25 MED ORDER — LANTUS SOLOSTAR 100 UNIT/ML ~~LOC~~ SOPN: 10.0000 [IU] | PEN_INJECTOR | Freq: Every day | SUBCUTANEOUS | 11 refills | Status: DC

## 2019-12-25 MED ORDER — INSULIN PEN NEEDLE 32G X 4 MM MISC: 3 refills | Status: DC

## 2019-12-25 NOTE — Progress Notes (Signed)
Patient ID: Brian [name], male   DOB: 07-19-1936, 84 y.o.   MRN: [id number]   This visit occurred during the SARS-CoV-2 public health emergency.  Safety protocols were in place, including screening questions prior to the visit, additional usage of staff PPE, and extensive cleaning of exam room while observing appropriate contact time as indicated for disinfecting solutions.   HPI: Brian [name] is a 84 y.o.-year-old male-year-old male,returning for f/u for DM2, dx in early 1990s, non-insulin-dependent, uncontrolled, with complications (cerebrovascular disease-s/p stroke, ED). Last visit 2 years ago.  Since last visit, he had a stroke 06/2018. At that time, he relaxed his diet, started to drink juice. Sugars increased significantly.  He did not check sugars in the last at least 2 months.  Reviewed HbA1c levels: Lab Results  Component Value Date   HGBA1C 9.8 (H) 11/12/2019   HGBA1C 8.2 (A) 08/11/2019   HGBA1C 11.6 (H) 02/02/2019   HGBA1C 6.1 (H) 06/23/2018   HGBA1C 6.4 (H) 04/02/2018   HGBA1C 6.4 12/19/2017   HGBA1C 6.4 08/19/2017   HGBA1C 8.8 05/10/2017   HGBA1C 9.6 10/02/2016   HGBA1C 7.9 (H) 07/12/2016   HGBA1C 9.3 (H) 01/12/2016   HGBA1C 7.7 (H) 07/07/2015   HGBA1C 6.7 (H) 12/30/2014   HGBA1C 6.9 (H) 07/01/2014   HGBA1C 7.6 (H) 12/24/2013   HGBA1C 6.5 06/24/2013   HGBA1C 10.7 (H) 12/22/2012   HGBA1C 11.9 (H) 06/23/2012   HGBA1C 6.5 06/14/2011   HGBA1C 6.2 12/07/2010   He is on: - Metformin ER 1000 mg with dinner >> with breakfast >> 1000 mg 2x a day - Januvia 100 mg before brunch - Actos 15 mg in a.m. He was on Amaryl >> dizziness, palpitations.  Pt is not checking sugars - did not know how to use his meter. At today's visit: CBG: 394. From last visit: - am:  131-145 >> 110s, 120 >> 121-125 - 2h after b'fast: n/c >> 134 >> n/c - before lunch: 190-220 >> 130s-140 >> 120s - 2h after lunch: n/c - before dinner: n/c > 130s-140 >> 130s - 2h after dinner: n/c - bedtime: n/c - nighttime:  n/c Lowest sugar was 110 >> 115 >> ?; he has hypoglycemia awareness in the 70s. Highest sugar was 140s >> 130s >> ?.  Glucometer: Accu-Chek [employer]iva  - + mild CKD, last BUN/creatinine:  Lab Results  Component Value Date   BUN 14 11/12/2019   BUN 13 02/02/2019   CREATININE 1.04 11/12/2019   CREATININE 1.04 02/02/2019  On Cozaar. -+ HL; last set of lipids: Lab Results  Component Value Date   CHOL 171 11/12/2019   HDL 50.70 11/12/2019   LDLCALC 100 (H) 11/12/2019   TRIG 105.0 11/12/2019   CHOLHDL 3 11/12/2019  On Lipitor 80. - last eye exam was in 10/2019: No DR -No numbness and tingling in his feet.  On Neurontin.  ROS: Constitutional: no weight gain/no weight loss, no fatigue, no subjective hyperthermia, no subjective hypothermia Eyes: no blurry vision, no xerophthalmia ENT: no sore throat, no nodules palpated in neck, no dysphagia, no odynophagia, no hoarseness Cardiovascular: no CP/no SOB/no palpitations/no leg swelling Respiratory: no cough/no SOB/no wheezing Gastrointestinal: no N/no V/no D/no C/no acid reflux Musculoskeletal: no muscle aches/no joint aches Skin: no rashes, no hair loss Neurological: no tremors/no numbness/no tingling/no dizziness  I reviewed pt's medications, allergies, PMH, social hx, family hx, and changes were documented in the history of present illness. Otherwise, unchanged from my initial visit note.   Past Medical History:  Diagnosis Date  . ALLERGIC RHINITIS 10/13/2007  . ANEMIA, CHRONIC DISEASE NEC 03/31/2007  . BENIGN PROSTATIC HYPERTROPHY 03/31/2007  . Chest pain    Stress echo, normal, December, 2012  . Chronic anemia   . DIABETES MELLITUS, TYPE II 03/31/2007  . [place] DISEASE, LUMBAR 03/31/2007  . ED (erectile dysfunction)   . Ejection fraction    EF  normal, stress echo, December,  2012  . History of prostatitis   . HYPERLIPIDEMIA 03/31/2007  . HYPERTENSION 03/31/2007  . Nephrolithiasis 12/07/2010  . Obesity   . POLYP, ANAL AND RECTAL  03/31/2007  . Right eye trauma    hx as child  . Stroke [facility])    Past Surgical History:  Procedure Laterality Date  . [place] SURGERY     1999  . PROSTATE BIOPSY     s/p  . RECTAL POLYPECTOMY     Transanal excision 10/2005  . SKIN BIOPSY     s/p right upper back 2009- benign Dr. Tonia Brooms   Social History   Social History  . Marital status: Married    Spouse name: N/A  . Number of children: 2   Occupational History  . [employer] administrator     Retired 1999   Social History Main Topics  . Smoking status: Never Smoker  . Smokeless tobacco: Not on file  . Alcohol use Yes  . Drug use: No   Current Outpatient Medications on File Prior to Visit  Medication Sig Dispense Refill  . acetaminophen (TYLENOL) 325 MG tablet Take 2 tablets (650 mg total) by mouth every 4 (four) hours as needed for mild pain or fever (or temp > 37.5 C (99.5 F)). 20 tablet 1  . amLODipine (NORVASC) 10 MG tablet TAKE 1 TABLET BY MOUTH EVERY DAY 90 tablet 2  . aspirin EC 81 MG EC tablet Take 1 tablet (81 mg total) by mouth daily. With breakfast 30 tablet 2  . atorvastatin (LIPITOR) 80 MG tablet TAKE 1 TABLET BY MOUTH EVERY DAY IN THE EVENING AT 6PM 90 tablet 0  . Blood Glucose Monitoring Suppl (ONETOUCH VERIO) w/Device KIT Use as directed twice daily E11.9 1 kit 0  . carvedilol (COREG) 6.25 MG tablet TAKE 1 TABLET BY MOUTH TWICE A DAY 180 tablet 0  . ELIQUIS 5 MG TABS tablet TAKE 1 TABLET BY MOUTH TWICE A DAY 60 tablet 5  . furosemide (LASIX) 20 MG tablet Take 1 tablet (20 mg total) by mouth daily. 90 tablet 3  . gabapentin (NEURONTIN) 100 MG capsule TAKE 1 CAPSULE BY MOUTH THREE TIMES A DAY 270 capsule 1  . glucose blood (ONETOUCH VERIO) test strip Use as directed twice daily E11.9 200 each 12  . JANUVIA 100 MG tablet TAKE 1 TABLET BY MOUTH EVERY DAY 90 tablet 1  . Lancets MISC Use as directed twice daily E11.9 200 each 3  . losartan (COZAAR) 100 MG tablet TAKE 1 TABLET BY MOUTH EVERY DAY 90 tablet 1  .  metFORMIN (GLUCOPHAGE) 500 MG tablet Take 2 tablets (1,000 mg total) by mouth 2 (two) times daily with a meal. (Patient taking differently: Take 1,000 mg by mouth daily with breakfast. ) 360 tablet 3  . OVER THE COUNTER MEDICATION Place 1 drop into both eyes daily as needed (dry eyes). Over the counter lubricating eye drop    . pioglitazone (ACTOS) 15 MG tablet Take 1 tablet (15 mg total) by mouth daily. 90 tablet 3  . potassium chloride SA (K-DUR) 20 MEQ tablet  Take 1 tablet (20 mEq total) by mouth 2 (two) times a week. 25 tablet 3  . predniSONE (DELTASONE) 10 MG tablet 3 tabs by mouth per day for 3 days,2tabs per day for 3 days,1tab per day for 3 days 18 tablet 0  . traMADol (ULTRAM) 50 MG tablet Take 1 tablet (50 mg total) by mouth every 6 (six) hours as needed. 30 tablet 0  . Vitamin D, Ergocalciferol, (DRISDOL) 1.25 MG (50000 UNIT) CAPS capsule Take 1 capsule (50,000 Units total) by mouth every 7 (seven) days. 12 capsule 0   No current facility-administered medications on file prior to visit.   No Known Allergies Family History  Problem Relation Age of Onset  . Colon polyps Mother   . Stroke Mother        due to ICA stenosis  . CVA Neg Hx   . Seizures Neg Hx    PE: BP (!) 140/92   Pulse (!) 102   Ht 6' (1.829 m)   Wt 201 lb (91.2 kg)   SpO2 98%   BMI 27.26 kg/m  Wt Readings from Last 3 Encounters:  12/25/19 201 lb (91.2 kg)  12/07/19 203 lb (92.1 kg)  11/12/19 200 lb 6.4 oz (90.9 kg)   Constitutional: overweight, in NAD Eyes: PERRLA, EOMI, no exophthalmos ENT: moist mucous membranes, no thyromegaly, no cervical lymphadenopathy Cardiovascular: RRR, No MRG Respiratory: CTA B Gastrointestinal: abdomen soft, NT, ND, BS+ Musculoskeletal: no deformities, strength intact in all 4 Skin: moist, warm, no rashes Neurological: no tremor with outstretched hands, DTR normal in all 4  ASSESSMENT: 1. DM2, non-insulin-dependent, uncontrolled, with complications - Cerebrovascular  disease, status post stroke 06/2018 - ED  2. HL  3. Overweight  PLAN:  1. Patient with longstanding, previously uncontrolled, diabetes, doing great on the plant-based diet, with an HbA1c of 6.4% at last visit.  However, since then, she was lost for follow-up for 2 years and his HbA1c increased significantly to 11.6%.  A subsequent HbA1c improved, at 8.2%, but latest level obtained 1.5 months ago was higher, at 9.8%.  He mentions that he relaxed his diet after our last visit and is drinking no juice and regular sodas.  -Since last visit, he had a stroke on 06/2018.  He is now on Actos.  This was shown to improve the risk of stroke. -At this visit, he is not taking any sugars.  We checked his CBG at the time of the appointment this was 394.  He did drink juice today.  I advised him that he absolutely needs to stop drinking any sweet drinks.  However, since his sugars are so high, we also need to start him on basal insulin.  I do not anticipate that this will be a long term treatment for him. We will start at a low dose and increase as needed we will continue the rest of the regimen for now. -He was shown how to use the insulin pen.   -We also showed him how to use his meter - I suggested to:  Patient Instructions  Please continue: - Metformin ER 1000 mg 2x a day - Januvia 100 mg before b'fast - Actos 15 mg before b'fast  Please add: - Lantus 10 units at bedtime - increase by 2 units every 2 days until sugars in am are <140 or you reach 26 units.  Please return in 1.5 months with your sugar log.  - advised to check sugars at different times of the day - 1-2x a  day, rotating check times - advised for yearly eye exams >> he is UTD - return to clinic in 1.5 months   2. HL - Reviewed latest lipid panel from 10/2019: LDL higher than target of less than 70, as are the rest of the fractions Lab Results  Component Value Date   CHOL 171 11/12/2019   HDL 50.70 11/12/2019   LDLCALC 100 (H)  11/12/2019   TRIG 105.0 11/12/2019   CHOLHDL 3 11/12/2019  - Continues Lipitor 80 without side effects.  3. Overweight -Gained a net 3 pounds since last visit, 2 years ago -Unfortunately, we need to start insulin, which is also weight inducing -He is planning to improve his diet and I strongly advised him to do so and to stop drinking any sweet drinks  Philemon Kingdom, MD PhD [facility] Endocrinology

## 2019-12-25 NOTE — Progress Notes (Signed)
Before leaving office, pt was taught how to properly use insulin pen. Pt was taught proper cleaning technique in preparation for injection as well as proper injection techniques. Pt did have significant questions about how the insulin worked. Pt was taught how long acting insulin works to bring the blood sugar down, and he then verbalized understanding. After several times of demonstration, pt then return demonstrated the injection process properly 3 separate times.

## 2019-12-25 NOTE — Telephone Encounter (Signed)
OK to change?

## 2019-12-25 NOTE — Patient Instructions (Addendum)
Please continue: - Metformin ER 1000 mg 2x a day - Januvia 100 mg before b'fast - Actos 15 mg before b'fast  Please add: - Lantus 10 units at bedtime - increase by 2 units every 2 days until sugars in am are <140 or you reach 26 units.  Please return in 1.5 months with your sugar log.

## 2019-12-25 NOTE — Telephone Encounter (Signed)
Lantus not covered, pharmacy suggest [facility]

## 2020-01-01 ENCOUNTER — Telehealth: Payer: Self-pay

## 2020-01-01 MED ORDER — BASAGLAR KWIKPEN 100 UNIT/ML ~~LOC~~ SOPN: 26.0000 [IU] | PEN_INJECTOR | Freq: Every day | SUBCUTANEOUS | 2 refills | Status: DC

## 2020-01-01 NOTE — Telephone Encounter (Signed)
Patient is to take up to 26 units daily but RX was sent for 10 units.  New updated RX sent.

## 2020-01-01 NOTE — Telephone Encounter (Signed)
Patient calling today and states he used the basaglar according to the instructions -patient is almost out of the basaglar and called [facility] on [address] but was told it was too soon for a refill-please contact patient as soon as you can to discuss this matter-patient will be out of this medication by Sunday-patient wants a call today to find out status of this he is worried about running out of this [place]

## 2020-01-04 ENCOUNTER — Other Ambulatory Visit: Payer: Self-pay

## 2020-01-04 MED ORDER — BASAGLAR KWIKPEN 100 UNIT/ML ~~LOC~~ SOPN: 26.0000 [IU] | PEN_INJECTOR | Freq: Every day | SUBCUTANEOUS | 2 refills | Status: DC

## 2020-01-15 ENCOUNTER — Other Ambulatory Visit: Payer: Self-pay | Admitting: Internal Medicine

## 2020-01-23 ENCOUNTER — Other Ambulatory Visit: Payer: Self-pay | Admitting: Internal Medicine

## 2020-01-23 NOTE — Telephone Encounter (Signed)
Please refill as per office routine med refill policy (all routine meds refilled for 3 mo or monthly per pt preference up to one year from last visit, then month to month grace period for 3 mo, then further med refills will have to be denied)  

## 2020-02-05 ENCOUNTER — Other Ambulatory Visit: Payer: Self-pay | Admitting: Internal Medicine

## 2020-02-05 NOTE — Telephone Encounter (Signed)
Please change to OTC Vitamin D3 at 2000 units per day, indefinitely.  

## 2020-02-07 ENCOUNTER — Other Ambulatory Visit: Payer: Self-pay | Admitting: Internal Medicine

## 2020-02-09 ENCOUNTER — Other Ambulatory Visit: Payer: Self-pay

## 2020-02-11 ENCOUNTER — Encounter: Payer: Self-pay | Admitting: Internal Medicine

## 2020-02-11 ENCOUNTER — Other Ambulatory Visit: Payer: Self-pay

## 2020-02-11 ENCOUNTER — Ambulatory Visit (INDEPENDENT_AMBULATORY_CARE_PROVIDER_SITE_OTHER): Payer: Medicare HMO | Admitting: Internal Medicine

## 2020-02-11 VITALS — BP 130/60 | HR 78 | Ht 72.0 in | Wt 202.0 lb

## 2020-02-11 DIAGNOSIS — E663 Overweight: Secondary | ICD-10-CM

## 2020-02-11 DIAGNOSIS — E1159 Type 2 diabetes mellitus with other circulatory complications: Secondary | ICD-10-CM

## 2020-02-11 DIAGNOSIS — E1165 Type 2 diabetes mellitus with hyperglycemia: Secondary | ICD-10-CM

## 2020-02-11 DIAGNOSIS — E782 Mixed hyperlipidemia: Secondary | ICD-10-CM | POA: Diagnosis not present

## 2020-02-11 LAB — POCT GLYCOSYLATED HEMOGLOBIN (HGB A1C): Hemoglobin A1C: 7.8 % — AB (ref 4.0–5.6)

## 2020-02-11 MED ORDER — BASAGLAR KWIKPEN 100 UNIT/ML ~~LOC~~ SOPN: 20.0000 [IU] | PEN_INJECTOR | Freq: Every day | SUBCUTANEOUS | 5 refills | Status: DC

## 2020-02-11 NOTE — Progress Notes (Signed)
Patient ID: [name], male   DOB: [birth year], 84 y.o.   MRN: [id number]   This visit occurred during the SARS-CoV-2 public health emergency.  Safety protocols were in place, including screening questions prior to the visit, additional usage of staff PPE, and extensive cleaning of exam room while observing appropriate contact time as indicated for disinfecting solutions.   HPI: Luby Seamans Dimercurio is a 84 y.o.-year-old male,returning for f/u for DM2, dx in early 1990s, insulin-dependent, uncontrolled, with complications (cerebrovascular disease-s/p stroke, ED). Last visit 1.5 months ago.  He had a stroke 06/2018. At that time, he relaxed his diet, started to drink juice. Sugars increased significantly.  At last visit he was not checking sugars.  I advised him to start doing so and we also started insulin.  Unfortunately, he misunderstood instructions and after he got to the target insulin dose, he stopped it (approximately 2 weeks ago).  Sugars increased afterwards.  Reviewed HbA1c levels: Lab Results  Component Value Date   HGBA1C 9.8 (H) 11/12/2019   HGBA1C 8.2 (A) 08/11/2019   HGBA1C 11.6 (H) 02/02/2019   HGBA1C 6.1 (H) 06/23/2018   HGBA1C 6.4 (H) 04/02/2018   HGBA1C 6.4 12/19/2017   HGBA1C 6.4 08/19/2017   HGBA1C 8.8 05/10/2017   HGBA1C 9.6 10/02/2016   HGBA1C 7.9 (H) 07/12/2016   HGBA1C 9.3 (H) 01/12/2016   HGBA1C 7.7 (H) 07/07/2015   HGBA1C 6.7 (H) 12/30/2014   HGBA1C 6.9 (H) 07/01/2014   HGBA1C 7.6 (H) 12/24/2013   HGBA1C 6.5 06/24/2013   HGBA1C 10.7 (H) 12/22/2012   HGBA1C 11.9 (H) 06/23/2012   HGBA1C 6.5 06/14/2011   HGBA1C 6.2 12/07/2010   He is on: - Metformin ER 1000 mg with dinner >> with breakfast >> 1000 mg 2x a day - Januvia 100 mg before brunch - Actos 15 mg in a.m. - Lantus 10 >> 26 units at bedtime-started 10/2019 He was on Amaryl >> dizziness, palpitations.  He was not checking sugars at last visit.  Now checking once a day: - am:  131-145 >> 110s, 120 >>  121-125 >> 76-125 on insulin, 160-186 off insulin - 2h after b'fast: n/c >> 134 >> n/c - before lunch: 190-220 >> 130s-140 >> 120s >> n/c - 2h after lunch: n/c - before dinner: n/c > 130s-140 >> 130s >> n/c - 2h after dinner: n/c - bedtime: n/c - nighttime: n/c Lowest sugar was 110 >> 115 >> 76; he has hypoglycemia awareness in the 70s. Highest sugar was 140s >> 130s >> 186.  Glucometer: Accu-Chek Aviva  -+ Mild CKD, last BUN/creatinine:  Lab Results  Component Value Date   BUN 14 11/12/2019   BUN 13 02/02/2019   CREATININE 1.04 11/12/2019   CREATININE 1.04 02/02/2019  On Cozaar. -+ HL; last set of lipids: Lab Results  Component Value Date   CHOL 171 11/12/2019   HDL 50.70 11/12/2019   LDLCALC 100 (H) 11/12/2019   TRIG 105.0 11/12/2019   CHOLHDL 3 11/12/2019  On Lipitor 80. - last eye exam was in 10/2019: No DR -No numbness and tingling in his feet.  On Neurontin.  ROS: Constitutional: no weight gain/no weight loss, no fatigue, no subjective hyperthermia, no subjective hypothermia Eyes: no blurry vision, no xerophthalmia ENT: no sore throat, no nodules palpated in neck, no dysphagia, no odynophagia, no hoarseness Cardiovascular: no CP/no SOB/no palpitations/no leg swelling Respiratory: no cough/no SOB/no wheezing Gastrointestinal: no N/no V/no D/no C/no acid reflux Musculoskeletal: no muscle aches/no joint aches Skin: no rashes, no  hair loss Neurological: no tremors/no numbness/no tingling/no dizziness  I reviewed pt's medications, allergies, PMH, social hx, family hx, and changes were documented in the history of present illness. Otherwise, unchanged from my initial visit note.   Past Medical History:  Diagnosis Date  . ALLERGIC RHINITIS 10/13/2007  . ANEMIA, CHRONIC DISEASE NEC 03/31/2007  . BENIGN PROSTATIC HYPERTROPHY 03/31/2007  . Chest pain    Stress echo, normal, December, 2012  . Chronic anemia   . DIABETES MELLITUS, TYPE II 03/31/2007  . [place] DISEASE,  LUMBAR 03/31/2007  . ED (erectile dysfunction)   . Ejection fraction    EF  normal, stress echo, December,  2012  . History of prostatitis   . HYPERLIPIDEMIA 03/31/2007  . HYPERTENSION 03/31/2007  . Nephrolithiasis 12/07/2010  . Obesity   . POLYP, ANAL AND RECTAL 03/31/2007  . Right eye trauma    hx as child  . Stroke [facility])    Past Surgical History:  Procedure Laterality Date  . [place] SURGERY     1999  . PROSTATE BIOPSY     s/p  . RECTAL POLYPECTOMY     Transanal excision 10/2005  . SKIN BIOPSY     s/p right upper back 2009- benign Dr. Tonia Brooms   Social History   Social History  . Marital status: Married    Spouse name: N/A  . Number of children: 2   Occupational History  . [employer] administrator     Retired 1999   Social History Main Topics  . Smoking status: Never Smoker  . Smokeless tobacco: Not on file  . Alcohol use Yes  . Drug use: No   Current Outpatient Medications on File Prior to Visit  Medication Sig Dispense Refill  . acetaminophen (TYLENOL) 325 MG tablet Take 2 tablets (650 mg total) by mouth every 4 (four) hours as needed for mild pain or fever (or temp > 37.5 C (99.5 F)). 20 tablet 1  . amLODipine (NORVASC) 10 MG tablet TAKE 1 TABLET BY MOUTH EVERY DAY 90 tablet 2  . aspirin EC 81 MG EC tablet Take 1 tablet (81 mg total) by mouth daily. With breakfast 30 tablet 2  . atorvastatin (LIPITOR) 80 MG tablet TAKE 1 TABLET BY MOUTH EVERY DAY IN THE EVENING AT 6PM 90 tablet 0  . Blood Glucose Monitoring Suppl (ONETOUCH VERIO) w/Device KIT Use as directed twice daily E11.9 1 kit 0  . carvedilol (COREG) 6.25 MG tablet TAKE 1 TABLET BY MOUTH TWICE A DAY 180 tablet 0  . ELIQUIS 5 MG TABS tablet TAKE 1 TABLET BY MOUTH TWICE A DAY 60 tablet 5  . furosemide (LASIX) 20 MG tablet TAKE 1 TABLET BY MOUTH EVERY DAY 90 tablet 1  . gabapentin (NEURONTIN) 100 MG capsule TAKE 1 CAPSULE BY MOUTH THREE TIMES A DAY 270 capsule 1  . glucose blood (ONETOUCH VERIO) test strip Use as  directed twice daily E11.9 200 each 12  . Insulin Glargine (BASAGLAR KWIKPEN) 100 UNIT/ML Inject 0.26 mLs (26 Units total) into the skin daily. 23.4 mL 2  . Insulin Pen Needle 32G X 4 MM MISC Use 1x a day 100 each 3  . JANUVIA 100 MG tablet TAKE 1 TABLET BY MOUTH EVERY DAY 90 tablet 1  . KLOR-CON M20 20 MEQ tablet TAKE 1 TABLET (20 MEQ TOTAL) BY MOUTH 2 (TWO) TIMES A WEEK. 25 tablet 3  . Lancets MISC Use as directed twice daily E11.9 200 each 3  . losartan (COZAAR) 100 MG  tablet TAKE 1 TABLET BY MOUTH EVERY DAY 90 tablet 1  . metFORMIN (GLUCOPHAGE) 500 MG tablet Take 2 tablets (1,000 mg total) by mouth 2 (two) times daily with a meal. (Patient taking differently: Take 1,000 mg by mouth daily with breakfast. ) 360 tablet 3  . OVER THE COUNTER MEDICATION Place 1 drop into both eyes daily as needed (dry eyes). Over the counter lubricating eye drop    . pioglitazone (ACTOS) 15 MG tablet Take 1 tablet (15 mg total) by mouth daily. 90 tablet 3  . predniSONE (DELTASONE) 10 MG tablet 3 tabs by mouth per day for 3 days,2tabs per day for 3 days,1tab per day for 3 days 18 tablet 0  . traMADol (ULTRAM) 50 MG tablet Take 1 tablet (50 mg total) by mouth every 6 (six) hours as needed. 30 tablet 0  . Vitamin D, Ergocalciferol, (DRISDOL) 1.25 MG (50000 UNIT) CAPS capsule Take 1 capsule (50,000 Units total) by mouth every 7 (seven) days. 12 capsule 0   No current facility-administered medications on file prior to visit.   No Known Allergies Family History  Problem Relation Age of Onset  . Colon polyps Mother   . Stroke Mother        due to ICA stenosis  . CVA Neg Hx   . Seizures Neg Hx    PE: BP 130/60   Pulse 78   Ht 6' (1.829 m)   Wt 202 lb (91.6 kg)   SpO2 98%   BMI 27.40 kg/m  Wt Readings from Last 3 Encounters:  02/11/20 202 lb (91.6 kg)  12/25/19 201 lb (91.2 kg)  12/07/19 203 lb (92.1 kg)   Constitutional: overweight, in NAD Eyes: PERRLA, EOMI, no exophthalmos ENT: moist mucous  membranes, no thyromegaly, no cervical lymphadenopathy Cardiovascular: RRR, No MRG Respiratory: CTA B Gastrointestinal: abdomen soft, NT, ND, BS+ Musculoskeletal: no deformities, strength intact in all 4 Skin: moist, warm, no rashes Neurological: no tremor with outstretched hands, DTR normal in all 4  ASSESSMENT: 1. DM2, insulin-dependent, uncontrolled, with complications - Cerebrovascular disease, status post stroke 06/2018 - ED  2. HL  3. Overweight  PLAN:  1. Patient with longstanding, previously uncontrolled diabetes, doing great on a plant-based diet in the past, however, which sugars worsening after being lost to follow-up for 2 years.  HbA1c was 11.6% and he actually had a stroke on 06/2018.  He was started on Actos, which was shown to improve the risk for stroke. -Before our last visit, HbA1c was 9.8%.  CBG in the office was 394.  We checked this as he was not checking any sugars at home.  We discussed about stopping sweet drinks and we also started long-acting insulin low-dose. -Sugars improved significantly afterwards, but he misunderstood instructions and asked that he increase the dose to 26 units, he stopped it (?!).  He was telling me that his sugars were between 76 and 120 in the morning when he was taking the entire insulin dose.  He feels that the 70s were slightly lower for him so at this visit we discussed about restarting Lantus at a lower dose, of 18 units and increase to 20 units if the sugars are higher than 1:30 in the morning.  We will continue the rest of the regimen. -I refilled his Lantus - I suggested to:  Patient Instructions  Please continue: - Metformin ER 1000 mg 2x a day - Januvia 100 mg before b'fast - Actos 15 mg before b'fast  Please restart: -  Lantus 18 units at bedtime. If the sugars in am are >130 on this dose, increase to 20 units and stay on this dose.  Please return in 3 months with your sugar log.  - we checked his HbA1c: 7.8% (markedly  improved) - advised to check sugars at different times of the day - 1x a day, rotating check times - advised for yearly eye exams >> he is UTD - return to clinic in 3 months  2. HL -Reviewed latest lipid panel from 10/2019: LDL above target, the rest of the fractions at goal: Lab Results  Component Value Date   CHOL 171 11/12/2019   HDL 50.70 11/12/2019   LDLCALC 100 (H) 11/12/2019   TRIG 105.0 11/12/2019   CHOLHDL 3 11/12/2019  - continues Lipitor 80 without side effects  3. Overweight -Unfortunately, we need to continue with Actos and we had to add insulin at last visit, which are both weight inducing -At last visit he was planning to improve his diet and I strongly advised him to stop sweet drinks -At this visit, he gained 1 pound  Philemon Kingdom, MD PhD [facility] Endocrinology

## 2020-02-11 NOTE — Patient Instructions (Addendum)
Please continue: - Metformin ER 1000 mg 2x a day - Januvia 100 mg before b'fast - Actos 15 mg before b'fast  Please restart: - Lantus 18 units at bedtime. If the sugars in am are >130 on this dose, increase to 20 units and stay on this dose.  Please return in 3 months with your sugar log.

## 2020-02-11 NOTE — Addendum Note (Signed)
Addended by: Cardell Peach I on: 02/11/2020 03:25 PM   Modules accepted: Orders

## 2020-02-29 ENCOUNTER — Other Ambulatory Visit: Payer: Self-pay | Admitting: Cardiovascular Disease

## 2020-03-09 ENCOUNTER — Other Ambulatory Visit (HOSPITAL_COMMUNITY): Payer: Self-pay | Admitting: Internal Medicine

## 2020-03-09 NOTE — Telephone Encounter (Signed)
Pt last saw Dr Harrington Challenger 12/07/19, last labs 11/12/19 Creat 1.04, age 84, weight 91.6kg, based on specified criteria pt is on appropriate dosage of Eliquis 5mg  BID.  Will refill rx.

## 2020-04-02 ENCOUNTER — Other Ambulatory Visit: Payer: Self-pay | Admitting: Internal Medicine

## 2020-04-02 NOTE — Telephone Encounter (Signed)
Please refill as per office routine med refill policy (all routine meds refilled for 3 mo or monthly per pt preference up to one year from last visit, then month to month grace period for 3 mo, then further med refills will have to be denied)  

## 2020-04-14 ENCOUNTER — Other Ambulatory Visit: Payer: Self-pay | Admitting: Internal Medicine

## 2020-04-14 NOTE — Telephone Encounter (Signed)
Please refill as per office routine med refill policy (all routine meds refilled for 3 mo or monthly per pt preference up to one year from last visit, then month to month grace period for 3 mo, then further med refills will have to be denied)  

## 2020-04-21 DIAGNOSIS — E113293 Type 2 diabetes mellitus with mild nonproliferative diabetic retinopathy without macular edema, bilateral: Secondary | ICD-10-CM | POA: Diagnosis not present

## 2020-04-21 DIAGNOSIS — H40013 Open angle with borderline findings, low risk, bilateral: Secondary | ICD-10-CM | POA: Diagnosis not present

## 2020-04-21 DIAGNOSIS — H25012 Cortical age-related cataract, left eye: Secondary | ICD-10-CM | POA: Diagnosis not present

## 2020-04-21 DIAGNOSIS — H2512 Age-related nuclear cataract, left eye: Secondary | ICD-10-CM | POA: Diagnosis not present

## 2020-04-21 LAB — HM DIABETES EYE EXAM

## 2020-04-29 ENCOUNTER — Other Ambulatory Visit: Payer: Self-pay | Admitting: Internal Medicine

## 2020-04-29 NOTE — Telephone Encounter (Signed)
Please refill as per office routine med refill policy (all routine meds refilled for 3 mo or monthly per pt preference up to one year from last visit, then month to month grace period for 3 mo, then further med refills will have to be denied)  

## 2020-05-08 ENCOUNTER — Other Ambulatory Visit: Payer: Self-pay | Admitting: Internal Medicine

## 2020-06-09 ENCOUNTER — Ambulatory Visit (INDEPENDENT_AMBULATORY_CARE_PROVIDER_SITE_OTHER): Payer: Medicare HMO | Admitting: Internal Medicine

## 2020-06-09 ENCOUNTER — Other Ambulatory Visit: Payer: Self-pay

## 2020-06-09 ENCOUNTER — Encounter: Payer: Self-pay | Admitting: Internal Medicine

## 2020-06-09 VITALS — BP 160/82 | HR 71 | Ht 72.0 in | Wt 212.0 lb

## 2020-06-09 DIAGNOSIS — E1159 Type 2 diabetes mellitus with other circulatory complications: Secondary | ICD-10-CM

## 2020-06-09 DIAGNOSIS — E782 Mixed hyperlipidemia: Secondary | ICD-10-CM

## 2020-06-09 DIAGNOSIS — E663 Overweight: Secondary | ICD-10-CM

## 2020-06-09 DIAGNOSIS — E1165 Type 2 diabetes mellitus with hyperglycemia: Secondary | ICD-10-CM | POA: Diagnosis not present

## 2020-06-09 LAB — POCT GLYCOSYLATED HEMOGLOBIN (HGB A1C): Hemoglobin A1C: 5.3 % (ref 4.0–5.6)

## 2020-06-09 MED ORDER — BASAGLAR KWIKPEN 100 UNIT/ML ~~LOC~~ SOPN: 14.0000 [IU] | PEN_INJECTOR | Freq: Every day | SUBCUTANEOUS | 5 refills | Status: DC

## 2020-06-09 NOTE — Patient Instructions (Signed)
Please continue: - Metformin ER 1000 mg 2x a day - Januvia 100 mg before b'fast  Please stop: - Actos  Please decrease: - Lantus to 14 units at bedtime  Please let me know if sugars remain closer to 70.   Please return in 4 months with your sugar log.

## 2020-06-09 NOTE — Progress Notes (Signed)
Patient ID: Brian [name], male   DOB: 05-14-1936, 84 y.o.   MRN: [id number]   This visit occurred during the SARS-CoV-2 public health emergency.  Safety protocols were in place, including screening questions prior to the visit, additional usage of staff PPE, and extensive cleaning of exam room while observing appropriate contact time as indicated for disinfecting solutions.   HPI: Brian [name] is a 84 y.o.-year-old male,returning for f/u for DM2, dx in early 1990s, insulin-dependent, uncontrolled, with complications (cerebrovascular disease-s/p stroke 06/2018, ED). Last visit 4 months ago.  Reviewed HbA1c levels: Lab Results  Component Value Date   HGBA1C 7.8 (A) 02/11/2020   HGBA1C 9.8 (H) 11/12/2019   HGBA1C 8.2 (A) 08/11/2019   HGBA1C 11.6 (H) 02/02/2019   HGBA1C 6.1 (H) 06/23/2018   HGBA1C 6.4 (H) 04/02/2018   HGBA1C 6.4 12/19/2017   HGBA1C 6.4 08/19/2017   HGBA1C 8.8 05/10/2017   HGBA1C 9.6 10/02/2016   HGBA1C 7.9 (H) 07/12/2016   HGBA1C 9.3 (H) 01/12/2016   HGBA1C 7.7 (H) 07/07/2015   HGBA1C 6.7 (H) 12/30/2014   HGBA1C 6.9 (H) 07/01/2014   HGBA1C 7.6 (H) 12/24/2013   HGBA1C 6.5 06/24/2013   HGBA1C 10.7 (H) 12/22/2012   HGBA1C 11.9 (H) 06/23/2012   HGBA1C 6.5 06/14/2011   He is on: - Metformin ER 1000 mg with dinner >> with breakfast >> 1000 mg 2x a day - Januvia 100 mg before brunch - Actos 15 mg in a.m. - Lantus 10 >> 26 >> 18 >> 20 units at bedtime-restarted 01/2020 He was on Amaryl >> dizziness, palpitations.  He is checking sugars once a day per review of his log: - am:  131-145 >> 110s, 120 >> 121-125 >> 76-125 on insulin, 160-186 off insulin >> 70-90, 128 - 4 mo ago - 2h after b'fast: n/c >> 134 >> n/c - before lunch: 190-220 >> 130s-140 >> 120s >> n/c - 2h after lunch: n/c - before dinner: n/c > 130s-140 >> 130s >> n/c - 2h after dinner: n/c - bedtime: n/c - nighttime: n/c Lowest sugar was 110 >> 115 >> 76 >> 70; he has hypoglycemia awareness in the  70s. Highest sugar was 140s >> 130s >> 186 >> 128.  Glucometer: Accu-Chek Aviva  -+ Mild CKD, last BUN/creatinine:  Lab Results  Component Value Date   BUN 14 11/12/2019   BUN 13 02/02/2019   CREATININE 1.04 11/12/2019   CREATININE 1.04 02/02/2019  On Cozaar. -+ HL; last set of lipids: Lab Results  Component Value Date   CHOL 171 11/12/2019   HDL 50.70 11/12/2019   LDLCALC 100 (H) 11/12/2019   TRIG 105.0 11/12/2019   CHOLHDL 3 11/12/2019  On Lipitor 80. - last eye exam was in 10/2019: No DR - no numbness and tingling in his feet.  On Neurontin.  ROS: Constitutional: no weight gain/no weight loss, no fatigue, no subjective hyperthermia, no subjective hypothermia Eyes: no blurry vision, no xerophthalmia ENT: no sore throat, no nodules palpated in neck, no dysphagia, no odynophagia, no hoarseness Cardiovascular: no CP/no SOB/no palpitations/no leg swelling Respiratory: no cough/no SOB/no wheezing Gastrointestinal: no N/no V/no D/no C/no acid reflux Musculoskeletal: no muscle aches/no joint aches Skin: no rashes, no hair loss Neurological: no tremors/no numbness/no tingling/no dizziness  I reviewed pt's medications, allergies, PMH, social hx, family hx, and changes were documented in the history of present illness. Otherwise, unchanged from my initial visit note.   Past Medical History:  Diagnosis Date  . ALLERGIC RHINITIS 10/13/2007  .  ANEMIA, CHRONIC DISEASE NEC 03/31/2007  . BENIGN PROSTATIC HYPERTROPHY 03/31/2007  . Chest pain    Stress echo, normal, December, 2012  . Chronic anemia   . DIABETES MELLITUS, TYPE II 03/31/2007  . [place] DISEASE, LUMBAR 03/31/2007  . ED (erectile dysfunction)   . Ejection fraction    EF  normal, stress echo, December,  2012  . History of prostatitis   . HYPERLIPIDEMIA 03/31/2007  . HYPERTENSION 03/31/2007  . Nephrolithiasis 12/07/2010  . Obesity   . POLYP, ANAL AND RECTAL 03/31/2007  . Right eye trauma    hx as child  . Stroke [facility])     Past Surgical History:  Procedure Laterality Date  . [place] SURGERY     1999  . PROSTATE BIOPSY     s/p  . RECTAL POLYPECTOMY     Transanal excision 10/2005  . SKIN BIOPSY     s/p right upper back 2009- benign Dr. Tonia [name]   Social History   Social History  . Marital status: Married    Spouse name: N/A  . Number of children: 2   Occupational History  . [employer] administrator     Retired 1999   Social History Main Topics  . Smoking status: Never Smoker  . Smokeless tobacco: Not on file  . Alcohol use Yes  . Drug use: No   Current Outpatient Medications on File Prior to Visit  Medication Sig Dispense Refill  . acetaminophen (TYLENOL) 325 MG tablet Take 2 tablets (650 mg total) by mouth every 4 (four) hours as needed for mild pain or fever (or temp > 37.5 C (99.5 F)). 20 tablet 1  . amLODipine (NORVASC) 10 MG tablet TAKE 1 TABLET BY MOUTH EVERY DAY 90 tablet 2  . aspirin EC 81 MG EC tablet Take 1 tablet (81 mg total) by mouth daily. With breakfast 30 tablet 2  . atorvastatin (LIPITOR) 80 MG tablet TAKE 1 TABLET BY MOUTH EVERY DAY IN THE EVENING AT 6PM 90 tablet 1  . Blood Glucose Monitoring Suppl (ONETOUCH VERIO) w/Device KIT Use as directed twice daily E11.9 1 kit 0  . carvedilol (COREG) 6.25 MG tablet TAKE 1 TABLET BY MOUTH TWICE A DAY 180 tablet 2  . ELIQUIS 5 MG TABS tablet TAKE 1 TABLET BY MOUTH TWICE A DAY 60 tablet 5  . furosemide (LASIX) 20 MG tablet TAKE 1 TABLET BY MOUTH EVERY DAY 90 tablet 1  . gabapentin (NEURONTIN) 100 MG capsule TAKE 1 CAPSULE BY MOUTH THREE TIMES A DAY 270 capsule 1  . glucose blood (ONETOUCH VERIO) test strip Use as directed twice daily E11.9 200 each 12  . Insulin Glargine (BASAGLAR KWIKPEN) 100 UNIT/ML Inject 0.2 mLs (20 Units total) into the skin at bedtime. 15 mL 5  . Insulin Pen Needle 32G X 4 MM MISC Use 1x a day 100 each 3  . JANUVIA 100 MG tablet TAKE 1 TABLET BY MOUTH EVERY DAY 90 tablet 1  . KLOR-CON M20 20 MEQ tablet TAKE 1  TABLET (20 MEQ TOTAL) BY MOUTH 2 (TWO) TIMES A WEEK. 25 tablet 3  . Lancets MISC Use as directed twice daily E11.9 200 each 3  . losartan (COZAAR) 100 MG tablet TAKE 1 TABLET BY MOUTH EVERY DAY 90 tablet 1  . metFORMIN (GLUCOPHAGE) 500 MG tablet TAKE 2 TABLETS (1,000 MG TOTAL) BY MOUTH 2 (TWO) TIMES DAILY WITH A MEAL. 360 tablet 3  . OVER THE COUNTER MEDICATION Place 1 drop into both eyes daily as  needed (dry eyes). Over the counter lubricating eye drop    . pioglitazone (ACTOS) 15 MG tablet Take 1 tablet (15 mg total) by mouth daily. 90 tablet 3  . predniSONE (DELTASONE) 10 MG tablet 3 tabs by mouth per day for 3 days,2tabs per day for 3 days,1tab per day for 3 days 18 tablet 0  . traMADol (ULTRAM) 50 MG tablet Take 1 tablet (50 mg total) by mouth every 6 (six) hours as needed. 30 tablet 0  . Vitamin D, Ergocalciferol, (DRISDOL) 1.25 MG (50000 UNIT) CAPS capsule Take 1 capsule (50,000 Units total) by mouth every 7 (seven) days. 12 capsule 0   No current facility-administered medications on file prior to visit.   No Known Allergies Family History  Problem Relation Age of Onset  . Colon polyps Mother   . Stroke Mother        due to ICA stenosis  . CVA Neg Hx   . Seizures Neg Hx    PE: BP (!) 160/82 (BP Location: Left Arm, Patient Position: Sitting, Cuff Size: Normal)   Pulse 71   Ht 6' (1.829 m)   Wt 212 lb (96.2 kg)   SpO2 99%   BMI 28.75 kg/m  Wt Readings from Last 3 Encounters:  06/09/20 212 lb (96.2 kg)  02/11/20 202 lb (91.6 kg)  12/25/19 201 lb (91.2 kg)   Constitutional: overweight, in NAD Eyes: PERRLA, EOMI, no exophthalmos ENT: moist mucous membranes, no thyromegaly, no cervical lymphadenopathy Cardiovascular: RRR, No MRG Respiratory: CTA B Gastrointestinal: abdomen soft, NT, ND, BS+ Musculoskeletal: no deformities, strength intact in all 4 Skin: moist, warm, no rashes Neurological: no tremor with outstretched hands, DTR normal in all 4  ASSESSMENT: 1. DM2,  insulin-dependent, uncontrolled, with complications - Cerebrovascular disease, status post stroke 06/2018 - ED  2. HL  3. Overweight  PLAN:  1. Patient with longstanding, previously uncontrolled diabetes, previously doing great on a plant-based diet, but then been lost for follow-up for 2 years.  HbA1c was 11.6% in 01/2019.  He actually had a stroke in 06/2018 after he relaxed his diet and stopped checking sugars.  He was started on Actos.  -At last visit, sugars started to improve, but then increased as he actually misunderstood instructions and stopped his insulin completely after our previous visit.  We added this back at the lower dose.  At that time, HbA1c was much better, at 7.8%. - at today's visit, his sugars are excellent, even to the point of mild lows, at 70.  However, he only checks in the morning and I advised him to also try to check later in the day.  He does not have symptoms of hypoglycemia.  However, due to the excellent control, will decrease the dose of Lantus and also try to stop Actos.  Upon questioning, he stopped to drink since last visit and I think this is a major contributor to his significant blood sugar improvement at this visit - we checked his HbA1c: 5.3% (MUCH better) - I suggested to:  Patient Instructions  Please continue: - Metformin ER 1000 mg 2x a day - Januvia 100 mg before b'fast  Please stop: - Actos  Please decrease: - Lantus to 14 units at bedtime  Please let me know if sugars remain closer to 70.   Please return in 4 months with your sugar log.  - advised to check sugars at different times of the day - 1x a day, rotating check times - advised for yearly eye exams >>  he is UTD - return to clinic in 3-4 months  2. HL Reviewed latest lipid panel from 10/2019: LDL above goal of less than 70 Lab Results  Component Value Date   CHOL 171 11/12/2019   HDL 50.70 11/12/2019   LDLCALC 100 (H) 11/12/2019   TRIG 105.0 11/12/2019   CHOLHDL 3  11/12/2019  -Continues Lipitor at maximum dose without side effects. -Improving diabetes will also help  3. Overweight -We discussed in the past about the absolute importance of stopping sweet drinks >> he stopped  since last visit -He gained approximately 10 pounds since last visit, most likely due to addition of insulin.  We are starting to decrease the dose at this visit.  We are also stopping Actos.  Philemon Kingdom, MD PhD [facility] Endocrinology

## 2020-07-05 ENCOUNTER — Ambulatory Visit (INDEPENDENT_AMBULATORY_CARE_PROVIDER_SITE_OTHER): Payer: Medicare HMO | Admitting: Internal Medicine

## 2020-07-05 ENCOUNTER — Encounter: Payer: Self-pay | Admitting: Internal Medicine

## 2020-07-05 ENCOUNTER — Other Ambulatory Visit: Payer: Self-pay

## 2020-07-05 VITALS — BP 150/82 | HR 71 | Temp 97.8°F | Ht 72.0 in | Wt 213.0 lb

## 2020-07-05 DIAGNOSIS — T63441A Toxic effect of venom of bees, accidental (unintentional), initial encounter: Secondary | ICD-10-CM

## 2020-07-05 DIAGNOSIS — E1165 Type 2 diabetes mellitus with hyperglycemia: Secondary | ICD-10-CM

## 2020-07-05 DIAGNOSIS — E559 Vitamin D deficiency, unspecified: Secondary | ICD-10-CM

## 2020-07-05 DIAGNOSIS — I10 Essential (primary) hypertension: Secondary | ICD-10-CM | POA: Diagnosis not present

## 2020-07-05 DIAGNOSIS — E1159 Type 2 diabetes mellitus with other circulatory complications: Secondary | ICD-10-CM

## 2020-07-05 DIAGNOSIS — Z0001 Encounter for general adult medical examination with abnormal findings: Secondary | ICD-10-CM

## 2020-07-05 DIAGNOSIS — Z23 Encounter for immunization: Secondary | ICD-10-CM | POA: Diagnosis not present

## 2020-07-05 DIAGNOSIS — Z0000 Encounter for general adult medical examination without abnormal findings: Secondary | ICD-10-CM

## 2020-07-05 DIAGNOSIS — E782 Mixed hyperlipidemia: Secondary | ICD-10-CM

## 2020-07-05 NOTE — Patient Instructions (Addendum)
You had the flu shot today  Please continue all other medications as before, and refills have been done if requested.  Please have the pharmacy call with any other refills you may need.  Please continue your efforts at being more active, low cholesterol diet, and weight control.  You are otherwise up to date with prevention measures today.  Please keep your appointments with your specialists as you may have planned  Please make an Appointment to return in 6 months, or sooner if needed

## 2020-07-05 NOTE — Progress Notes (Signed)
Subjective:    Patient ID: Brian [name], male    DOB: [birth year], 84 y.o.   MRN: [id number]  HPI  Here for wellness and f/u;  Overall doing ok;  Pt denies Chest pain, worsening SOB, DOE, wheezing, orthopnea, PND, worsening LE edema, palpitations, dizziness or syncope.  Pt denies neurological change such as new headache, facial or extremity weakness.  Pt denies polydipsia, polyuria, or low sugar symptoms. Pt states overall good compliance with treatment and medications, good tolerability, and has been trying to follow appropriate diet.  Pt denies worsening depressive symptoms, suicidal ideation or panic. No fever, night sweats, wt loss, loss of appetite, or other constitutional symptoms.  Pt states good ability with ADL's, has low fall risk, home safety reviewed and adequate, no other significant changes in hearing or vision, and only occasionally active with exercise. Also had a bee sting to the left post neck working in the yard 4 days ago, now much indurated and swollen but still itchy.  Also had a naval d/c yellowish itchy about 1 wk ago as well, now dried up it seems by the time he gets here.  Has not yet started vit d. Past Medical History:  Diagnosis Date  . ALLERGIC RHINITIS 10/13/2007  . ANEMIA, CHRONIC DISEASE NEC 03/31/2007  . BENIGN PROSTATIC HYPERTROPHY 03/31/2007  . Chest pain    Stress echo, normal, December, 2012  . Chronic anemia   . DIABETES MELLITUS, TYPE II 03/31/2007  . [place] DISEASE, LUMBAR 03/31/2007  . ED (erectile dysfunction)   . Ejection fraction    EF  normal, stress echo, December,  2012  . History of prostatitis   . HYPERLIPIDEMIA 03/31/2007  . HYPERTENSION 03/31/2007  . Nephrolithiasis 12/07/2010  . Obesity   . POLYP, ANAL AND RECTAL 03/31/2007  . Right eye trauma    hx as child  . Stroke [facility])    Past Surgical History:  Procedure Laterality Date  . [place] SURGERY     1999  . PROSTATE BIOPSY     s/p  . RECTAL POLYPECTOMY     Transanal excision 10/2005    . SKIN BIOPSY     s/p right upper back 2009- benign Dr. Tonia Brooms    reports that he quit smoking about 26 years ago. He quit after 10.00 years of use. He has never used smokeless tobacco. He reports current alcohol use of about 1.0 standard drink of alcohol per week. He reports that he does not use drugs. family history includes Colon polyps in his mother; Stroke in his mother. No Known Allergies Current Outpatient Medications on File Prior to Visit  Medication Sig Dispense Refill  . acetaminophen (TYLENOL) 325 MG tablet Take 2 tablets (650 mg total) by mouth every 4 (four) hours as needed for mild pain or fever (or temp > 37.5 C (99.5 F)). 20 tablet 1  . amLODipine (NORVASC) 10 MG tablet TAKE 1 TABLET BY MOUTH EVERY DAY 90 tablet 2  . aspirin EC 81 MG EC tablet Take 1 tablet (81 mg total) by mouth daily. With breakfast 30 tablet 2  . atorvastatin (LIPITOR) 80 MG tablet TAKE 1 TABLET BY MOUTH EVERY DAY IN THE EVENING AT 6PM 90 tablet 1  . Blood Glucose Monitoring Suppl (ONETOUCH VERIO) w/Device KIT Use as directed twice daily E11.9 1 kit 0  . carvedilol (COREG) 6.25 MG tablet TAKE 1 TABLET BY MOUTH TWICE A DAY 180 tablet 2  . ELIQUIS 5 MG TABS tablet TAKE 1 TABLET BY  MOUTH TWICE A DAY 60 tablet 5  . furosemide (LASIX) 20 MG tablet TAKE 1 TABLET BY MOUTH EVERY DAY 90 tablet 1  . gabapentin (NEURONTIN) 100 MG capsule TAKE 1 CAPSULE BY MOUTH THREE TIMES A DAY 270 capsule 1  . glucose blood (ONETOUCH VERIO) test strip Use as directed twice daily E11.9 200 each 12  . Insulin Glargine (BASAGLAR KWIKPEN) 100 UNIT/ML Inject 14 Units into the skin at bedtime. 15 mL 5  . Insulin Pen Needle 32G X 4 MM MISC Use 1x a day 100 each 3  . JANUVIA 100 MG tablet TAKE 1 TABLET BY MOUTH EVERY DAY 90 tablet 1  . KLOR-CON M20 20 MEQ tablet TAKE 1 TABLET (20 MEQ TOTAL) BY MOUTH 2 (TWO) TIMES A WEEK. 25 tablet 3  . Lancets MISC Use as directed twice daily E11.9 200 each 3  . losartan (COZAAR) 100 MG tablet TAKE 1  TABLET BY MOUTH EVERY DAY 90 tablet 1  . metFORMIN (GLUCOPHAGE) 500 MG tablet TAKE 2 TABLETS (1,000 MG TOTAL) BY MOUTH 2 (TWO) TIMES DAILY WITH A MEAL. 360 tablet 3  . OVER THE COUNTER MEDICATION Place 1 drop into both eyes daily as needed (dry eyes). Over the counter lubricating eye drop    . predniSONE (DELTASONE) 10 MG tablet 3 tabs by mouth per day for 3 days,2tabs per day for 3 days,1tab per day for 3 days 18 tablet 0  . traMADol (ULTRAM) 50 MG tablet Take 1 tablet (50 mg total) by mouth every 6 (six) hours as needed. 30 tablet 0   No current facility-administered medications on file prior to visit.   Review of Systems All otherwise neg per pt     Objective:   Physical Exam BP (!) 150/82 (BP Location: Left Arm, Patient Position: Sitting, Cuff Size: Large)   Pulse 71   Temp 97.8 F (36.6 C) (Oral)   Ht 6' (1.829 m)   Wt 213 lb (96.6 kg)   SpO2 97%   BMI 28.89 kg/m  VS noted,  Constitutional: Pt appears in NAD HENT: Head: NCAT.  Right Ear: External ear normal.  Left Ear: External ear normal.  Eyes: . Pupils are equal, round, and reactive to light. Conjunctivae and EOM are normal Nose: without d/c or deformity Neck: Neck supple. Gross normal ROM, has some non tender induration to left post neck, no redness or abscess Cardiovascular: Normal rate and regular rhythm.   Pulmonary/Chest: Effort normal and breath sounds without rales or wheezing.  Abd:  Soft, NT, ND, + BS, no organomegaly Neurological: Pt is alert. At baseline orientation, motor grossly intact Skin: Skin is warm. No rashes, other new lesions, no LE edema Psychiatric: Pt behavior is normal without agitation  All otherwise neg per pt Lab Results  Component Value Date   WBC 3.3 (L) 11/12/2019   HGB 13.1 11/12/2019   HCT 40.8 11/12/2019   PLT 173.0 11/12/2019   GLUCOSE 353 (H) 11/12/2019   CHOL 171 11/12/2019   TRIG 105.0 11/12/2019   HDL 50.70 11/12/2019   LDLCALC 100 (H) 11/12/2019   ALT 23 11/12/2019    AST 18 11/12/2019   NA 135 11/12/2019   K 4.4 11/12/2019   CL 98 11/12/2019   CREATININE 1.04 11/12/2019   BUN 14 11/12/2019   CO2 31 11/12/2019   TSH 1.02 11/12/2019   PSA 2.64 07/18/2017   INR 1.19 06/22/2018   HGBA1C 5.3 06/09/2020   MICROALBUR <0.7 11/12/2019  Assessment & Plan:

## 2020-07-10 ENCOUNTER — Encounter: Payer: Self-pay | Admitting: Internal Medicine

## 2020-07-10 DIAGNOSIS — E559 Vitamin D deficiency, unspecified: Secondary | ICD-10-CM | POA: Insufficient documentation

## 2020-07-10 DIAGNOSIS — T63441A Toxic effect of venom of bees, accidental (unintentional), initial encounter: Secondary | ICD-10-CM | POA: Insufficient documentation

## 2020-07-10 NOTE — Assessment & Plan Note (Signed)
stable overall by history and exam, recent data reviewed with pt, and pt to continue medical treatment as before,  to f/u any worsening symptoms or concerns  

## 2020-07-10 NOTE — Assessment & Plan Note (Signed)

## 2020-07-10 NOTE — Assessment & Plan Note (Signed)
To start vit d 2000 qd

## 2020-07-10 NOTE — Assessment & Plan Note (Addendum)
Benign appearance, reassured, cont to follow with benadryl cream otc prn  I spent 31 minutes in addition to time for CPX wellness examination in preparing to see the patient by review of recent labs, imaging and procedures, obtaining and reviewing separately obtained history, communicating with the patient and family or caregiver, ordering medications, tests or procedures, and documenting clinical information in the EHR including the differential Dx, treatment, and any further evaluation and other management of bee sting, dm, htn, hld, vit d def

## 2020-07-13 ENCOUNTER — Other Ambulatory Visit: Payer: Self-pay | Admitting: Internal Medicine

## 2020-08-19 ENCOUNTER — Other Ambulatory Visit: Payer: Self-pay | Admitting: Internal Medicine

## 2020-08-19 DIAGNOSIS — R69 Illness, unspecified: Secondary | ICD-10-CM | POA: Diagnosis not present

## 2020-09-05 ENCOUNTER — Other Ambulatory Visit (HOSPITAL_COMMUNITY): Payer: Self-pay | Admitting: Internal Medicine

## 2020-09-05 NOTE — Telephone Encounter (Signed)
Pt last saw Dr Harrington Challenger 12/07/19, last labs 11/12/19 Creat 1.04, age 84, weight 96.6kg, based on specified criteria pt is on appropriate dosage of Eliquis 5mg  BID.  Will refill rx.

## 2020-09-24 ENCOUNTER — Other Ambulatory Visit: Payer: Self-pay | Admitting: Internal Medicine

## 2020-09-25 NOTE — Telephone Encounter (Signed)
Please refill as per office routine med refill policy (all routine meds refilled for 3 mo or monthly per pt preference up to one year from last visit, then month to month grace period for 3 mo, then further med refills will have to be denied)  

## 2020-10-10 ENCOUNTER — Other Ambulatory Visit: Payer: Self-pay | Admitting: Internal Medicine

## 2020-10-11 ENCOUNTER — Ambulatory Visit: Payer: Medicare HMO | Admitting: Internal Medicine

## 2020-10-30 ENCOUNTER — Other Ambulatory Visit: Payer: Self-pay | Admitting: Internal Medicine

## 2020-10-30 NOTE — Telephone Encounter (Signed)
Please refill as per office routine med refill policy (all routine meds refilled for 3 mo or monthly per pt preference up to one year from last visit, then month to month grace period for 3 mo, then further med refills will have to be denied)  

## 2020-11-04 DIAGNOSIS — M1990 Unspecified osteoarthritis, unspecified site: Secondary | ICD-10-CM | POA: Diagnosis not present

## 2020-11-04 DIAGNOSIS — E1142 Type 2 diabetes mellitus with diabetic polyneuropathy: Secondary | ICD-10-CM | POA: Diagnosis not present

## 2020-11-04 DIAGNOSIS — E785 Hyperlipidemia, unspecified: Secondary | ICD-10-CM | POA: Diagnosis not present

## 2020-11-04 DIAGNOSIS — R2681 Unsteadiness on feet: Secondary | ICD-10-CM | POA: Diagnosis not present

## 2020-11-04 DIAGNOSIS — Z7901 Long term (current) use of anticoagulants: Secondary | ICD-10-CM | POA: Diagnosis not present

## 2020-11-04 DIAGNOSIS — Z7984 Long term (current) use of oral hypoglycemic drugs: Secondary | ICD-10-CM | POA: Diagnosis not present

## 2020-11-04 DIAGNOSIS — E663 Overweight: Secondary | ICD-10-CM | POA: Diagnosis not present

## 2020-11-04 DIAGNOSIS — I10 Essential (primary) hypertension: Secondary | ICD-10-CM | POA: Diagnosis not present

## 2020-11-04 DIAGNOSIS — Z6829 Body mass index (BMI) 29.0-29.9, adult: Secondary | ICD-10-CM | POA: Diagnosis not present

## 2020-11-04 DIAGNOSIS — N529 Male erectile dysfunction, unspecified: Secondary | ICD-10-CM | POA: Diagnosis not present

## 2020-11-10 ENCOUNTER — Other Ambulatory Visit: Payer: Self-pay | Admitting: Internal Medicine

## 2020-11-14 ENCOUNTER — Ambulatory Visit (INDEPENDENT_AMBULATORY_CARE_PROVIDER_SITE_OTHER): Payer: Medicare HMO | Admitting: Internal Medicine

## 2020-11-14 ENCOUNTER — Other Ambulatory Visit: Payer: Self-pay

## 2020-11-14 ENCOUNTER — Encounter: Payer: Self-pay | Admitting: Internal Medicine

## 2020-11-14 VITALS — BP 146/80 | HR 70 | Temp 98.3°F | Ht 72.0 in | Wt 218.0 lb

## 2020-11-14 DIAGNOSIS — E538 Deficiency of other specified B group vitamins: Secondary | ICD-10-CM

## 2020-11-14 DIAGNOSIS — E1165 Type 2 diabetes mellitus with hyperglycemia: Secondary | ICD-10-CM

## 2020-11-14 DIAGNOSIS — L989 Disorder of the skin and subcutaneous tissue, unspecified: Secondary | ICD-10-CM

## 2020-11-14 DIAGNOSIS — E1159 Type 2 diabetes mellitus with other circulatory complications: Secondary | ICD-10-CM | POA: Diagnosis not present

## 2020-11-14 DIAGNOSIS — Z0001 Encounter for general adult medical examination with abnormal findings: Secondary | ICD-10-CM

## 2020-11-14 DIAGNOSIS — N521 Erectile dysfunction due to diseases classified elsewhere: Secondary | ICD-10-CM | POA: Diagnosis not present

## 2020-11-14 DIAGNOSIS — E559 Vitamin D deficiency, unspecified: Secondary | ICD-10-CM

## 2020-11-14 DIAGNOSIS — IMO0002 Reserved for concepts with insufficient information to code with codable children: Secondary | ICD-10-CM

## 2020-11-14 DIAGNOSIS — I10 Essential (primary) hypertension: Secondary | ICD-10-CM

## 2020-11-14 LAB — VITAMIN D 25 HYDROXY (VIT D DEFICIENCY, FRACTURES): VITD: 25.87 ng/mL — ABNORMAL LOW (ref 30.00–100.00)

## 2020-11-14 LAB — URINALYSIS, ROUTINE W REFLEX MICROSCOPIC
Bilirubin Urine: NEGATIVE
Ketones, ur: NEGATIVE
Leukocytes,Ua: NEGATIVE
Nitrite: NEGATIVE
Specific Gravity, Urine: 1.025 (ref 1.000–1.030)
Urine Glucose: 500 — AB
Urobilinogen, UA: 0.2 (ref 0.0–1.0)
pH: 6 (ref 5.0–8.0)

## 2020-11-14 LAB — LIPID PANEL
Cholesterol: 125 mg/dL (ref 0–200)
HDL: 53.1 mg/dL (ref >39.00)
LDL Cholesterol: 55 mg/dL (ref 0–99)
NonHDL: 71.53
Total CHOL/HDL Ratio: 2
Triglycerides: 82 mg/dL (ref 0.0–149.0)
VLDL: 16.4 mg/dL (ref 0.0–40.0)

## 2020-11-14 LAB — CBC WITH DIFFERENTIAL/PLATELET
Basophils Absolute: 0 10*3/uL (ref 0.0–0.1)
Basophils Relative: 0.9 % (ref 0.0–3.0)
Eosinophils Absolute: 0.1 10*3/uL (ref 0.0–0.7)
Eosinophils Relative: 3.2 % (ref 0.0–5.0)
HCT: 38.2 % — ABNORMAL LOW (ref 39.0–52.0)
Hemoglobin: 12.2 g/dL — ABNORMAL LOW (ref 13.0–17.0)
Lymphocytes Relative: 24.2 % (ref 12.0–46.0)
Lymphs Abs: 1.1 10*3/uL (ref 0.7–4.0)
MCHC: 32 g/dL (ref 30.0–36.0)
MCV: 82.4 fl (ref 78.0–100.0)
Monocytes Absolute: 0.5 10*3/uL (ref 0.1–1.0)
Monocytes Relative: 12.2 % — ABNORMAL HIGH (ref 3.0–12.0)
Neutro Abs: 2.7 10*3/uL (ref 1.4–7.7)
Neutrophils Relative %: 59.5 % (ref 43.0–77.0)
Platelets: 184 10*3/uL (ref 150.0–400.0)
RBC: 4.64 Mil/uL (ref 4.22–5.81)
RDW: 14.5 % (ref 11.5–15.5)
WBC: 4.5 10*3/uL (ref 4.0–10.5)

## 2020-11-14 LAB — HEPATIC FUNCTION PANEL
ALT: 17 U/L (ref 0–53)
AST: 20 U/L (ref 0–37)
Albumin: 4.5 g/dL (ref 3.5–5.2)
Alkaline Phosphatase: 58 U/L (ref 39–117)
Bilirubin, Direct: 0.1 mg/dL (ref 0.0–0.3)
Total Bilirubin: 0.7 mg/dL (ref 0.2–1.2)
Total Protein: 7.6 g/dL (ref 6.0–8.3)

## 2020-11-14 LAB — MICROALBUMIN / CREATININE URINE RATIO
Creatinine,U: 133.9 mg/dL
Microalb Creat Ratio: 4.7 mg/g (ref 0.0–30.0)
Microalb, Ur: 6.3 mg/dL — ABNORMAL HIGH (ref 0.0–1.9)

## 2020-11-14 LAB — VITAMIN B12: Vitamin B-12: 238 pg/mL (ref 211–911)

## 2020-11-14 LAB — BASIC METABOLIC PANEL
BUN: 18 mg/dL (ref 6–23)
CO2: 30 mEq/L (ref 19–32)
Calcium: 10.2 mg/dL (ref 8.4–10.5)
Chloride: 102 mEq/L (ref 96–112)
Creatinine, Ser: 0.99 mg/dL (ref 0.40–1.50)
GFR: 70.73 mL/min (ref >60.00)
Glucose, Bld: 188 mg/dL — ABNORMAL HIGH (ref 70–99)
Potassium: 4 mEq/L (ref 3.5–5.1)
Sodium: 139 mEq/L (ref 135–145)

## 2020-11-14 LAB — HEMOGLOBIN A1C: Hgb A1c MFr Bld: 6 % (ref 4.6–6.5)

## 2020-11-14 LAB — TSH: TSH: 1.98 u[IU]/mL (ref 0.35–4.50)

## 2020-11-14 MED ORDER — CARVEDILOL 12.5 MG PO TABS: 12.5000 mg | ORAL_TABLET | Freq: Two times a day (BID) | ORAL | 3 refills | Status: DC

## 2020-11-14 MED ORDER — TRAMADOL HCL 50 MG PO TABS: 50.0000 mg | ORAL_TABLET | Freq: Four times a day (QID) | ORAL | 0 refills | Status: DC | PRN

## 2020-11-14 NOTE — Assessment & Plan Note (Signed)
Age and sex appropriate education and counseling updated with regular exercise and diet Referrals for preventative services - none needed Immunizations addressed - declines tdap Smoking counseling  - none needed Evidence for depression or other mood disorder - none significant Most recent labs reviewed. I have personally reviewed and have noted: 1) the patient's medical and social history 2) The patient's current medications and supplements 3) The patient's height, weight, and BMI have been recorded in the chart  

## 2020-11-14 NOTE — Patient Instructions (Signed)
Ok to increase the coreg to 12.5 mg twice per day for blood pressure and heart  Please continue all other medications as before, and refills have been done if requested.  Please have the pharmacy call with any other refills you may need.  Please continue your efforts at being more active, low cholesterol diet, and weight control.  You are otherwise up to date with prevention measures today.  Please keep your appointments with your specialists as you may have planned  Please go to the LAB at the blood drawing area for the tests to be done  You will be contacted by phone if any changes need to be made immediately.  Otherwise, you will receive a letter about your results with an explanation, but please check with MyChart first.  Please remember to sign up for MyChart if you have not done so, as this will be important to you in the future with finding out test results, communicating by private email, and scheduling acute appointments online when needed.  Please make an Appointment to return in 6 months, or sooner if needed

## 2020-11-14 NOTE — Progress Notes (Signed)
Patient ID: Brian [name], male   DOB: 11-12-1935, 85 y.o.   MRN: [id number]         Chief Complaint:: wellness exam and Follow-up  htn, naval infection, leg sore healed with a dark mark, and dm       HPI:  Brian [name] is a 85 y.o. male here for wellness exam; due for foot exam, and tdap but declines tdap                        Also has ongoing pain to the right knee and hip, asks for pain medicine as per given 2021, cant recall the name.  Also has a 1cm dark area to right lateral mid lower leg after scratched off a mole about 6 wks ago, just wanted to make sure I twas not infected.    Also has a Best boy and infection that seems to come and go and drain sometime and smell, mild to mod, intermittent fo 4 wks, onthing seems to make better or worse  Also BP at home often 140/90, did not take at home today, Pt denies chest pain, increased sob or doe, wheezing, orthopnea, PND, increased LE swelling, palpitations, dizziness or syncope.   Pt denies polydipsia, polyuria, Denies new focal neuro s/s.   Pt denies fever, wt loss, night sweats, loss of appetite, or other constitutional symptoms   Wt Readings from Last 3 Encounters:  11/14/20 218 lb (98.9 kg)  07/05/20 213 lb (96.6 kg)  06/09/20 212 lb (96.2 kg)   BP Readings from Last 3 Encounters:  11/14/20 (!) 146/80  07/05/20 (!) 150/82  06/09/20 (!) 160/82   Immunization History  Administered Date(s) Administered  . Fluad Quad(high Dose 65+) 05/22/2019, 07/05/2020  . Influenza Split 06/14/2011, 06/23/2012  . Influenza Whole 06/17/2009, 05/29/2010  . Influenza, High Dose Seasonal PF 07/07/2015, 07/12/2016, 07/18/2017, 06/05/2018  . Influenza,inj,Quad PF,6+ Mos 07/01/2014  . PFIZER(Purple Top)SARS-COV-2 Vaccination 10/19/2019, 11/09/2019, 06/27/2020  . Pneumococcal Conjugate-13 06/24/2013  . Pneumococcal Polysaccharide-23 09/17/2006  . Td 12/22/2008  . Zoster 02/19/2011   There are no preventive care reminders to display for this  patient.    Past Medical History:  Diagnosis Date  . ALLERGIC RHINITIS 10/13/2007  . ANEMIA, CHRONIC DISEASE NEC 03/31/2007  . BENIGN PROSTATIC HYPERTROPHY 03/31/2007  . Chest pain    Stress echo, normal, December, 2012  . Chronic anemia   . DIABETES MELLITUS, TYPE II 03/31/2007  . [place] DISEASE, LUMBAR 03/31/2007  . ED (erectile dysfunction)   . Ejection fraction    EF  normal, stress echo, December,  2012  . History of prostatitis   . HYPERLIPIDEMIA 03/31/2007  . HYPERTENSION 03/31/2007  . Nephrolithiasis 12/07/2010  . Obesity   . POLYP, ANAL AND RECTAL 03/31/2007  . Right eye trauma    hx as child  . Stroke [facility])    Past Surgical History:  Procedure Laterality Date  . [place] SURGERY     1999  . PROSTATE BIOPSY     s/p  . RECTAL POLYPECTOMY     Transanal excision 10/2005  . SKIN BIOPSY     s/p right upper back 2009- benign Dr. Tonia Brooms    reports that he quit smoking about 27 years ago. He quit after 10.00 years of use. He has never used smokeless tobacco. He reports current alcohol use of about 1.0 standard drink of alcohol per week. He reports that he does not use drugs. family history includes  Colon polyps in his mother; Stroke in his mother. No Known Allergies Current Outpatient Medications on File Prior to Visit  Medication Sig Dispense Refill  . acetaminophen (TYLENOL) 325 MG tablet Take 2 tablets (650 mg total) by mouth every 4 (four) hours as needed for mild pain or fever (or temp > 37.5 C (99.5 F)). 20 tablet 1  . amLODipine (NORVASC) 10 MG tablet TAKE 1 TABLET BY MOUTH EVERY DAY 90 tablet 2  . aspirin EC 81 MG EC tablet Take 1 tablet (81 mg total) by mouth daily. With breakfast 30 tablet 2  . atorvastatin (LIPITOR) 80 MG tablet TAKE 1 TABLET BY MOUTH EVERY DAY IN THE EVENING AT 6PM 90 tablet 1  . Blood Glucose Monitoring Suppl (ONETOUCH VERIO) w/Device KIT Use as directed twice daily E11.9 1 kit 0  . ELIQUIS 5 MG TABS tablet TAKE 1 TABLET BY MOUTH TWICE A DAY 60  tablet 5  . furosemide (LASIX) 20 MG tablet Take 1 tablet (20 mg total) by mouth daily. Please make yearly appt with Dr. Harrington Challenger for March 2022 for future refills. Thank you 1st attempt 90 tablet 0  . gabapentin (NEURONTIN) 100 MG capsule TAKE 1 CAPSULE BY MOUTH THREE TIMES A DAY 270 capsule 1  . Insulin Glargine (BASAGLAR KWIKPEN) 100 UNIT/ML Inject 14 Units into the skin at bedtime. 15 mL 5  . Insulin Pen Needle 32G X 4 MM MISC Use 1x a day 100 each 3  . JANUVIA 100 MG tablet TAKE 1 TABLET BY MOUTH EVERY DAY 90 tablet 1  . KLOR-CON M20 20 MEQ tablet TAKE 1 TABLET (20 MEQ TOTAL) BY MOUTH 2 (TWO) TIMES A WEEK. 25 tablet 3  . Lancets MISC Use as directed twice daily E11.9 200 each 3  . losartan (COZAAR) 100 MG tablet TAKE 1 TABLET BY MOUTH EVERY DAY 90 tablet 2  . metFORMIN (GLUCOPHAGE) 500 MG tablet TAKE 2 TABLETS (1,000 MG TOTAL) BY MOUTH 2 (TWO) TIMES DAILY WITH A MEAL. 360 tablet 3  . ONETOUCH VERIO test strip USE AS DIRECTED TWICE DAILY E11.9 200 strip 12  . OVER THE COUNTER MEDICATION Place 1 drop into both eyes daily as needed (dry eyes). Over the counter lubricating eye drop    . pioglitazone (ACTOS) 15 MG tablet Take 15 mg by mouth daily.    . predniSONE (DELTASONE) 10 MG tablet 3 tabs by mouth per day for 3 days,2tabs per day for 3 days,1tab per day for 3 days (Patient not taking: Reported on 11/14/2020) 18 tablet 0   No current facility-administered medications on file prior to visit.        ROS:  All others reviewed and negative.  Objective        PE:  BP (!) 146/80   Pulse 70   Temp 98.3 F (36.8 C) (Oral)   Ht 6' (1.829 m)   Wt 218 lb (98.9 kg)   SpO2 98%   BMI 29.57 kg/m                 Constitutional: Pt appears in NAD               HENT: Head: NCAT.                Right Ear: External ear normal.                 Left Ear: External ear normal.  Eyes: . Pupils are equal, round, and reactive to light. Conjunctivae and EOM are normal               Nose:  without d/c or deformity               Neck: Neck supple. Gross normal ROM               Cardiovascular: Normal rate and regular rhythm.                 Pulmonary/Chest: Effort normal and breath sounds without rales or wheezing.                Abd:  Soft, NT, ND, + BS, no organomegaly               Neurological: Pt is alert. At baseline orientation, motor grossly intact               Skin: Skin is warm. No rashes, no other new lesions, LE edema - none               Psychiatric: Pt behavior is normal without agitation   Micro: none  Cardiac tracings I have personally interpreted today:  none  Pertinent Radiological findings (summarize): none   Lab Results  Component Value Date   WBC 4.5 11/14/2020   HGB 12.2 (L) 11/14/2020   HCT 38.2 (L) 11/14/2020   PLT 184.0 11/14/2020   GLUCOSE 188 (H) 11/14/2020   CHOL 125 11/14/2020   TRIG 82.0 11/14/2020   HDL 53.10 11/14/2020   LDLCALC 55 11/14/2020   ALT 17 11/14/2020   AST 20 11/14/2020   NA 139 11/14/2020   K 4.0 11/14/2020   CL 102 11/14/2020   CREATININE 0.99 11/14/2020   BUN 18 11/14/2020   CO2 30 11/14/2020   TSH 1.98 11/14/2020   PSA 2.64 07/18/2017   INR 1.19 06/22/2018   HGBA1C 6.0 11/14/2020   MICROALBUR 6.3 (H) 11/14/2020   Assessment/Plan:  Nadav Swindell Cozby is a 85 y.o. Black or African American [2] male with  has a past medical history of ALLERGIC RHINITIS (10/13/2007), ANEMIA, CHRONIC DISEASE NEC (03/31/2007), BENIGN PROSTATIC HYPERTROPHY (03/31/2007), Chest pain, Chronic anemia, DIABETES MELLITUS, TYPE II (03/31/2007), [place] DISEASE, LUMBAR (03/31/2007), ED (erectile dysfunction), Ejection fraction, History of prostatitis, HYPERLIPIDEMIA (03/31/2007), HYPERTENSION (03/31/2007), Nephrolithiasis (12/07/2010), Obesity, POLYP, ANAL AND RECTAL (03/31/2007), Right eye trauma, and Stroke ([place]).  Encounter for well adult exam with abnormal findings Age and sex appropriate education and counseling updated with regular exercise and  diet Referrals for preventative services - none needed Immunizations addressed - declines tdap Smoking counseling  - none needed Evidence for depression or other mood disorder - none significant Most recent labs reviewed. I have personally reviewed and have noted: 1) the patient's medical and social history 2) The patient's current medications and supplements 3) The patient's height, weight, and BMI have been recorded in the chart   Hypertension, uncontrolled Mild uncontrolled, to increase the coreg 12.5 bid, and f/u cardiology as planned  Vitamin D deficiency Last vitamin D Lab Results  Component Value Date   VD25OH 25.87 (L) 11/14/2020   Low, to start oral replacement   Skin lesion of right leg C/w postinflammatory hyperpigmentation, no specific tx needed, cont to follow  Poorly controlled type 2 diabetes mellitus with circulatory disorder ([place]) Lab Results  Component Value Date   HGBA1C 6.0 11/14/2020   Stable, pt to continue current medical treatment glargine, januvia, metformin, actos  Followup: Return in about 6 months (around 05/14/2021).  Cathlean Cower, MD 11/14/2020 9:07 PM [facility]

## 2020-11-14 NOTE — Assessment & Plan Note (Signed)
Last vitamin D Lab Results  Component Value Date   VD25OH 25.87 (L) 11/14/2020   Low, to start oral replacement

## 2020-11-14 NOTE — Assessment & Plan Note (Signed)
C/w postinflammatory hyperpigmentation, no specific tx needed, cont to follow

## 2020-11-14 NOTE — Assessment & Plan Note (Signed)
Mild uncontrolled, to increase the coreg 12.5 bid, and f/u cardiology as planned

## 2020-11-14 NOTE — Assessment & Plan Note (Signed)
Lab Results  Component Value Date   HGBA1C 6.0 11/14/2020   Stable, pt to continue current medical treatment glargine, januvia, metformin, actos

## 2020-11-16 ENCOUNTER — Encounter: Payer: Self-pay | Admitting: Internal Medicine

## 2020-11-17 ENCOUNTER — Other Ambulatory Visit: Payer: Self-pay | Admitting: Internal Medicine

## 2020-11-17 NOTE — Telephone Encounter (Signed)
Please refill as per office routine med refill policy (all routine meds refilled for 3 mo or monthly per pt preference up to one year from last visit, then month to month grace period for 3 mo, then further med refills will have to be denied)  

## 2020-11-24 ENCOUNTER — Other Ambulatory Visit: Payer: Self-pay | Admitting: Internal Medicine

## 2020-12-06 ENCOUNTER — Other Ambulatory Visit: Payer: Self-pay

## 2020-12-06 ENCOUNTER — Ambulatory Visit: Payer: Medicare HMO | Admitting: Internal Medicine

## 2020-12-06 ENCOUNTER — Encounter: Payer: Self-pay | Admitting: Internal Medicine

## 2020-12-06 VITALS — BP 142/74 | HR 62 | Ht 72.0 in | Wt 222.2 lb

## 2020-12-06 DIAGNOSIS — Z79899 Other long term (current) drug therapy: Secondary | ICD-10-CM

## 2020-12-06 DIAGNOSIS — I10 Essential (primary) hypertension: Secondary | ICD-10-CM

## 2020-12-06 MED ORDER — HYDROCHLOROTHIAZIDE 12.5 MG PO CAPS: 12.5000 mg | ORAL_CAPSULE | Freq: Every day | ORAL | 3 refills | Status: DC

## 2020-12-06 NOTE — Progress Notes (Signed)
Cardiology Office Note   Date:  12/06/2020   ID:  BRIANT ANGELILLO, DOB Nov 22, 1935, MRN [id number]  PCP:  [name], MD  Cardiologist:   Dorris Carnes, MD    F/u of HTN and PAF     History of Present Illness: Brian [name] is a 85 y.o. male with a history of DM, HTN, CVA (7/19, 10/19), PAF (CHADSVASc 6)   I saw the pt in March 2021 SInce then he has done OK   No CP  No SOB  Rare palpitations    He does say that he urinates a lot   WOuld lkie to come off of furosemide   Current Meds  Medication Sig  . acetaminophen (TYLENOL) 325 MG tablet Take 2 tablets (650 mg total) by mouth every 4 (four) hours as needed for mild pain or fever (or temp > 37.5 C (99.5 F)).  [name] amLODipine (NORVASC) 10 MG tablet TAKE 1 TABLET BY MOUTH EVERY DAY  . aspirin EC 81 MG EC tablet Take 1 tablet (81 mg total) by mouth daily. With breakfast  . atorvastatin (LIPITOR) 80 MG tablet TAKE 1 TABLET BY MOUTH EVERY DAY IN THE EVENING AT 6PM  . Blood Glucose Monitoring Suppl (ONETOUCH VERIO) w/Device KIT Use as directed twice daily E11.9  . carvedilol (COREG) 12.5 MG tablet Take 1 tablet (12.5 mg total) by mouth 2 (two) times daily with a meal.  . ELIQUIS 5 MG TABS tablet TAKE 1 TABLET BY MOUTH TWICE A DAY  . furosemide (LASIX) 20 MG tablet Take 1 tablet (20 mg total) by mouth daily. Please make yearly appt with Dr. Harrington Challenger for March 2022 for future refills. Thank you 1st attempt  . gabapentin (NEURONTIN) 100 MG capsule TAKE 1 CAPSULE BY MOUTH THREE TIMES A DAY  . Insulin Glargine (BASAGLAR KWIKPEN) 100 UNIT/ML Inject 14 Units into the skin at bedtime.  . Insulin Pen Needle 32G X 4 MM MISC Use 1x a day  . JANUVIA 100 MG tablet TAKE 1 TABLET BY MOUTH EVERY DAY  . KLOR-CON M20 20 MEQ tablet TAKE 1 TABLET (20 MEQ TOTAL) BY MOUTH 2 (TWO) TIMES A WEEK.  . Lancets MISC Use as directed twice daily E11.9  . losartan (COZAAR) 100 MG tablet TAKE 1 TABLET BY MOUTH EVERY DAY  . metFORMIN (GLUCOPHAGE) 500 MG tablet TAKE 2 TABLETS  (1,000 MG TOTAL) BY MOUTH 2 (TWO) TIMES DAILY WITH A MEAL.  [name] ONETOUCH VERIO test strip USE AS DIRECTED TWICE DAILY E11.9  . OVER THE COUNTER MEDICATION Place 1 drop into both eyes daily as needed (dry eyes). Over the counter lubricating eye drop  . pioglitazone (ACTOS) 15 MG tablet TAKE 1 TABLET BY MOUTH EVERY DAY  . [DISCONTINUED] predniSONE (DELTASONE) 10 MG tablet 3 tabs by mouth per day for 3 days,2tabs per day for 3 days,1tab per day for 3 days  . [DISCONTINUED] traMADol (ULTRAM) 50 MG tablet Take 1 tablet (50 mg total) by mouth every 6 (six) hours as needed.     Allergies:   Patient has no known allergies.   Past Medical History:  Diagnosis Date  . ALLERGIC RHINITIS 10/13/2007  . ANEMIA, CHRONIC DISEASE NEC 03/31/2007  . BENIGN PROSTATIC HYPERTROPHY 03/31/2007  . Chest pain    Stress echo, normal, December, 2012  . Chronic anemia   . DIABETES MELLITUS, TYPE II 03/31/2007  . [place] DISEASE, LUMBAR 03/31/2007  . ED (erectile dysfunction)   . Ejection fraction    EF  normal, stress echo, December,  2012  . History of prostatitis   . HYPERLIPIDEMIA 03/31/2007  . HYPERTENSION 03/31/2007  . Nephrolithiasis 12/07/2010  . Obesity   . POLYP, ANAL AND RECTAL 03/31/2007  . Right eye trauma    hx as child  . Stroke [facility])     Past Surgical History:  Procedure Laterality Date  . [place] SURGERY     1999  . PROSTATE BIOPSY     s/p  . RECTAL POLYPECTOMY     Transanal excision 10/2005  . SKIN BIOPSY     s/p right upper back 2009- benign Dr. Tonia Brooms     Social History:  The patient  reports that he quit smoking about 27 years ago. He quit after 10.00 years of use. He has never used smokeless tobacco. He reports current alcohol use of about 1.0 standard drink of alcohol per week. He reports that he does not use drugs.   Family History:  The patient's family history includes Colon polyps in his mother; Stroke in his mother.    ROS:  Please see the history of present illness. All other  systems are reviewed and  Negative to the above problem except as noted.    PHYSICAL EXAM: VS:  BP (!) 142/74   Pulse 62   Ht 6' (1.829 m)   Wt 222 lb 3.2 oz (100.8 kg)   SpO2 97%   BMI 30.14 kg/m   GEN: Obese 85 yo, in no acute distress  HEENT: normal  Neck: no JVD, No bruits  Cardiac: RRR; no murmurs, rubs, or gallops,no LE edema  Respiratory:  clear to auscultation bilaterally, GI: soft, nontender, nondistended, + BS  No hepatomegaly  MS: no deformity Moving all extremities   Skin: warm and dry, no rash Neuro:  Strength and sensation are intact Psych: euthymic mood, full affect   EKG:  EKG is ordered today.  SR 62  First degree AV Block  PR 248 msec    Lipid Panel    Component Value Date/Time   CHOL 125 11/14/2020 1345   CHOL 141 10/09/2018 1328   TRIG 82.0 11/14/2020 1345   HDL 53.10 11/14/2020 1345   HDL 57 10/09/2018 1328   CHOLHDL 2 11/14/2020 1345   VLDL 16.4 11/14/2020 1345   LDLCALC 55 11/14/2020 1345   LDLCALC 69 10/09/2018 1328      Wt Readings from Last 3 Encounters:  12/06/20 222 lb 3.2 oz (100.8 kg)  11/14/20 218 lb (98.9 kg)  07/05/20 213 lb (96.6 kg)      ASSESSMENT AND PLAN:  1.  HTN.  BP is fair here   At home it is 130 to 140       WIll stop lasix and start HCTZ 12.5    Nurses visit in 3 wks with BMET to reassess BP   Bring cuff at that time  2  HL   LDL 55  HDL 53     3 Hx PAF   Denies palpitions Keep on Eliuqis     4  Hx CVA  Continue anticoag and lipid agents     5 DM  Has appt with Gherghe in April      Current medicines are reviewed at length with the patient today.  The patient does not have concerns regarding medicines.  Signed, Dorris Carnes, MD  12/06/2020 [facility] [address], [place], [place]  [address] Phone: [phone]; Fax: [phone]

## 2020-12-06 NOTE — Patient Instructions (Signed)
Medication Instructions:  Please discontinue your Furosemide.  Start Hydrochlorothiazide 12.5 mg a day. Continue all other medication as listed.  *If you need a refill on your cardiac medications before your next appointment, please call your pharmacy*   Lab Work: Please have blood work in 3 weeks (BMP)  If you have labs (blood work) drawn today and your tests are completely normal, you will receive your results only by: [name] MyChart Message (if you have MyChart) OR . A paper copy in the mail If you have any lab test that is abnormal or we need to change your treatment, we will call you to review the results.  Follow-Up: At [facility], you and your health needs are our priority.  As part of our continuing mission to provide you with exceptional heart care, we have created designated Provider Care Teams.  These Care Teams include your primary Cardiologist (physician) and Advanced Practice Providers (APPs -  Physician Assistants and Nurse Practitioners) who all work together to provide you with the care you need, when you need it.  We recommend signing up for the patient portal called "MyChart".  Sign up information is provided on this After Visit Summary.  MyChart is used to connect with patients for Virtual Visits (Telemedicine).  Patients are able to view lab/test results, encounter notes, upcoming appointments, etc.  Non-urgent messages can be sent to your provider as well.   To learn more about what you can do with MyChart, go to [email or web address].    Please come for a blood pressure check in 3 weeks when you have your lab work.   Your next appointment:   6 month(s)  The format for your next appointment:   In Person  Provider:   Dorris Carnes, MD

## 2020-12-16 ENCOUNTER — Ambulatory Visit: Payer: Medicare HMO | Admitting: Internal Medicine

## 2020-12-16 NOTE — Progress Notes (Deleted)
Patient ID: Brian [name], male   DOB: 1935-10-29, 85 y.o.   MRN: [id number]   This visit occurred during the SARS-CoV-2 public health emergency.  Safety protocols were in place, including screening questions prior to the visit, additional usage of staff PPE, and extensive cleaning of exam room while observing appropriate contact time as indicated for disinfecting solutions.   HPI: Brian [name] is a 85 y.o.-year-old male,returning for f/u for DM2, dx in early 1990s, insulin-dependent, uncontrolled, with complications (cerebrovascular disease-s/p stroke 06/2018, ED). Last visit 6 months ago.  Interim history:  Reviewed HbA1c levels: Lab Results  Component Value Date   HGBA1C 6.0 11/14/2020   HGBA1C 5.3 06/09/2020   HGBA1C 7.8 (A) 02/11/2020   HGBA1C 9.8 (H) 11/12/2019   HGBA1C 8.2 (A) 08/11/2019   HGBA1C 11.6 (H) 02/02/2019   HGBA1C 6.1 (H) 06/23/2018   HGBA1C 6.4 (H) 04/02/2018   HGBA1C 6.4 12/19/2017   HGBA1C 6.4 08/19/2017   HGBA1C 8.8 05/10/2017   HGBA1C 9.6 10/02/2016   HGBA1C 7.9 (H) 07/12/2016   HGBA1C 9.3 (H) 01/12/2016   HGBA1C 7.7 (H) 07/07/2015   HGBA1C 6.7 (H) 12/30/2014   HGBA1C 6.9 (H) 07/01/2014   HGBA1C 7.6 (H) 12/24/2013   HGBA1C 6.5 06/24/2013   HGBA1C 10.7 (H) 12/22/2012   He is on: - Metformin ER 1000 mg with dinner >> with breakfast >> 1000 mg 2x a day - Januvia 100 mg before brunch - Lantus 10 >> 26 >> 18 >> 20 >> 18 units at bedtime He was on Amaryl >> dizziness, palpitations. We stopped Actos 05/2020.  He is checking sugars once a day per review of his log: - am:  76-125 on insulin, 160-186 off insulin >> 70-90, 128 - 4 mo ago - 2h after b'fast: n/c >> 134 >> n/c - before lunch: 190-220 >> 130s-140 >> 120s >> n/c - 2h after lunch: n/c - before dinner: n/c > 130s-140 >> 130s >> n/c - 2h after dinner: n/c - bedtime: n/c - nighttime: n/c Lowest sugar was 76 >> 70; he has hypoglycemia awareness in the 70s. Highest sugar was 186 >>  128.  Glucometer: Accu-Chek Aviva  -+ Mild CKD, last BUN/creatinine:  Lab Results  Component Value Date   BUN 18 11/14/2020   BUN 14 11/12/2019   CREATININE 0.99 11/14/2020   CREATININE 1.04 11/12/2019  On Cozaar. -+ HL; last set of lipids: Lab Results  Component Value Date   CHOL 125 11/14/2020   HDL 53.10 11/14/2020   LDLCALC 55 11/14/2020   TRIG 82.0 11/14/2020   CHOLHDL 2 11/14/2020  On Lipitor 80. - last eye exam was in 10/2019: No DR - no numbness and tingling in his feet.  On Neurontin.  ROS: Constitutional: + weight gain/no weight loss, no fatigue, no subjective hyperthermia, no subjective hypothermia Eyes: no blurry vision, no xerophthalmia ENT: no sore throat, no nodules palpated in neck, no dysphagia, no odynophagia, no hoarseness Cardiovascular: no CP/no SOB/no palpitations/no leg swelling Respiratory: no cough/no SOB/no wheezing Gastrointestinal: no N/no V/no D/no C/no acid reflux Musculoskeletal: no muscle aches/no joint aches Skin: no rashes, no hair loss Neurological: no tremors/no numbness/no tingling/no dizziness  I reviewed pt's medications, allergies, PMH, social hx, family hx, and changes were documented in the history of present illness. Otherwise, unchanged from my initial visit note.   Past Medical History:  Diagnosis Date  . ALLERGIC RHINITIS 10/13/2007  . ANEMIA, CHRONIC DISEASE NEC 03/31/2007  . BENIGN PROSTATIC HYPERTROPHY 03/31/2007  . Chest pain  Stress echo, normal, December, 2012  . Chronic anemia   . DIABETES MELLITUS, TYPE II 03/31/2007  . [place] DISEASE, LUMBAR 03/31/2007  . ED (erectile dysfunction)   . Ejection fraction    EF  normal, stress echo, December,  2012  . History of prostatitis   . HYPERLIPIDEMIA 03/31/2007  . HYPERTENSION 03/31/2007  . Nephrolithiasis 12/07/2010  . Obesity   . POLYP, ANAL AND RECTAL 03/31/2007  . Right eye trauma    hx as child  . Stroke [facility])    Past Surgical History:  Procedure Laterality Date  .  [place] SURGERY     1999  . PROSTATE BIOPSY     s/p  . RECTAL POLYPECTOMY     Transanal excision 10/2005  . SKIN BIOPSY     s/p right upper back 2009- benign Dr. Tonia Brooms   Social History   Social History  . Marital status: Married    Spouse name: N/A  . Number of children: 2   Occupational History  . [employer] administrator     Retired 1999   Social History Main Topics  . Smoking status: Never Smoker  . Smokeless tobacco: Not on file  . Alcohol use Yes  . Drug use: No   Current Outpatient Medications on File Prior to Visit  Medication Sig Dispense Refill  . acetaminophen (TYLENOL) 325 MG tablet Take 2 tablets (650 mg total) by mouth every 4 (four) hours as needed for mild pain or fever (or temp > 37.5 C (99.5 F)). 20 tablet 1  . amLODipine (NORVASC) 10 MG tablet TAKE 1 TABLET BY MOUTH EVERY DAY 90 tablet 2  . aspirin EC 81 MG EC tablet Take 1 tablet (81 mg total) by mouth daily. With breakfast 30 tablet 2  . atorvastatin (LIPITOR) 80 MG tablet TAKE 1 TABLET BY MOUTH EVERY DAY IN THE EVENING AT 6PM 90 tablet 1  . Blood Glucose Monitoring Suppl (ONETOUCH VERIO) w/Device KIT Use as directed twice daily E11.9 1 kit 0  . carvedilol (COREG) 12.5 MG tablet Take 1 tablet (12.5 mg total) by mouth 2 (two) times daily with a meal. 180 tablet 3  . ELIQUIS 5 MG TABS tablet TAKE 1 TABLET BY MOUTH TWICE A DAY 60 tablet 5  . gabapentin (NEURONTIN) 100 MG capsule TAKE 1 CAPSULE BY MOUTH THREE TIMES A DAY 270 capsule 1  . hydrochlorothiazide (MICROZIDE) 12.5 MG capsule Take 1 capsule (12.5 mg total) by mouth daily. 45 capsule 3  . Insulin Glargine (BASAGLAR KWIKPEN) 100 UNIT/ML Inject 14 Units into the skin at bedtime. 15 mL 5  . Insulin Pen Needle 32G X 4 MM MISC Use 1x a day 100 each 3  . JANUVIA 100 MG tablet TAKE 1 TABLET BY MOUTH EVERY DAY 90 tablet 1  . KLOR-CON M20 20 MEQ tablet TAKE 1 TABLET (20 MEQ TOTAL) BY MOUTH 2 (TWO) TIMES A WEEK. 25 tablet 3  . Lancets MISC Use as directed twice  daily E11.9 200 each 3  . losartan (COZAAR) 100 MG tablet TAKE 1 TABLET BY MOUTH EVERY DAY 90 tablet 2  . metFORMIN (GLUCOPHAGE) 500 MG tablet TAKE 2 TABLETS (1,000 MG TOTAL) BY MOUTH 2 (TWO) TIMES DAILY WITH A MEAL. 360 tablet 3  . ONETOUCH VERIO test strip USE AS DIRECTED TWICE DAILY E11.9 200 strip 12  . OVER THE COUNTER MEDICATION Place 1 drop into both eyes daily as needed (dry eyes). Over the counter lubricating eye drop    . pioglitazone (  ACTOS) 15 MG tablet TAKE 1 TABLET BY MOUTH EVERY DAY 90 tablet 3   No current facility-administered medications on file prior to visit.   No Known Allergies Family History  Problem Relation Age of Onset  . Colon polyps Mother   . Stroke Mother        due to ICA stenosis  . CVA Neg Hx   . Seizures Neg Hx    PE: There were no vitals taken for this visit. Wt Readings from Last 3 Encounters:  12/06/20 222 lb 3.2 oz (100.8 kg)  11/14/20 218 lb (98.9 kg)  07/05/20 213 lb (96.6 kg)   Constitutional: overweight, in NAD Eyes: PERRLA, EOMI, no exophthalmos ENT: moist mucous membranes, no thyromegaly, no cervical lymphadenopathy Cardiovascular: RRR, No MRG Respiratory: CTA B Gastrointestinal: abdomen soft, NT, ND, BS+ Musculoskeletal: no deformities, strength intact in all 4 Skin: moist, warm, no rashes Neurological: no tremor with outstretched hands, DTR normal in all 4  ASSESSMENT: 1. DM2, insulin-dependent, uncontrolled, with complications - Cerebrovascular disease, status post stroke 06/2018 - ED  2. HL  3. Overweight  PLAN:  1. Patient with longstanding, previously uncontrolled diabetes, returning in 12/2018 after long absence of 2 years.  Before this, doing great on the plant-based diet but then relaxed his diet and start checking sugars and ended up having a stroke in 06/2018.  After this, his HbA1c increased to 11.6% in 01/2019.  However, sugars started to improve afterwards especially after adding insulin.  At last visit, we were  able to stop Actos.  His HbA1c at that time was excellent, at 5.3%.  Since then, he had another HbA1c checked a month ago and this was 6.0%, higher, but still at goal.  - I suggested to:  Patient Instructions  Please continue: - Metformin ER 1000 mg 2x a day - Januvia 100 mg before b'fast - Lantus 14 units at bedtime  Please return in 4 months with your sugar log.  - we checked his HbA1c: 7%  - advised to check sugars at different times of the day - 1-2x a day, rotating check times - advised for yearly eye exams >> he is UTD - return to clinic in 4 months  2. HL -Reviewed latest lipid panel from 10/2020: All fractions at goal: Lab Results  Component Value Date   CHOL 125 11/14/2020   HDL 53.10 11/14/2020   LDLCALC 55 11/14/2020   TRIG 82.0 11/14/2020   CHOLHDL 2 11/14/2020  -Continues Lipitor 80 mg daily without side effects  3. Overweight -He was able to stop sweet drinks before last visit -We had to add insulin, which is weight inducing, but we were able to stop Actos, which also can cause weight gain -He gained approximately 10 pounds since last visit  Philemon Kingdom, MD PhD [facility] Endocrinology

## 2020-12-23 ENCOUNTER — Telehealth: Payer: Self-pay | Admitting: Internal Medicine

## 2020-12-23 NOTE — Telephone Encounter (Signed)
Pt reports that he started HCTZ 12/06/20 but stopped for about 5 days.. he says he was very flushed and nauseated when he took it. He says that his symptoms subsided when he stopped it.   He has an appt with the nurse and lab for BP and BMET on 12/27/20... pot is asking if he needs to keep those appts.   His BP today at home was 144/81 and HR 78.   Will forward to Dr. Harrington Challenger and Caren Hazy.

## 2020-12-23 NOTE — Telephone Encounter (Signed)
Pt c/o medication issue:  1. Name of Medication: hydrochlorothiazide (MICROZIDE) 12.5 MG capsule  2. How are you currently taking this medication (dosage and times per day)? Patient has not taken the medicine for ~ 3 days  3. Are you having a reaction (difficulty breathing--STAT)? Hot flashes, nausea  4. What is your medication issue? Patient was not able to tolerate this medication. He said he would get hot and nauseous after taking this medicine.   He was not sure what to do about his upcoming Nurse visit.

## 2020-12-27 ENCOUNTER — Other Ambulatory Visit: Payer: Medicare HMO

## 2020-12-27 ENCOUNTER — Ambulatory Visit: Payer: Medicare HMO

## 2020-12-27 NOTE — Telephone Encounter (Signed)
Called patient and cancelled his lab and nurse visit appointment.  He will continue to monitor BP at home.  Was nauseated and flushed.  Did not check BP while he was feeling poorly.  He is aware that we will review with Dr. Harrington Challenger and call him back with recommendations.

## 2021-01-03 ENCOUNTER — Ambulatory Visit (INDEPENDENT_AMBULATORY_CARE_PROVIDER_SITE_OTHER): Payer: Medicare HMO | Admitting: Internal Medicine

## 2021-01-03 ENCOUNTER — Encounter: Payer: Self-pay | Admitting: Internal Medicine

## 2021-01-03 ENCOUNTER — Other Ambulatory Visit: Payer: Self-pay

## 2021-01-03 ENCOUNTER — Ambulatory Visit: Payer: Medicare HMO | Admitting: Internal Medicine

## 2021-01-03 VITALS — BP 128/82 | HR 60 | Ht 72.0 in | Wt 219.0 lb

## 2021-01-03 DIAGNOSIS — E1165 Type 2 diabetes mellitus with hyperglycemia: Secondary | ICD-10-CM | POA: Diagnosis not present

## 2021-01-03 DIAGNOSIS — E1159 Type 2 diabetes mellitus with other circulatory complications: Secondary | ICD-10-CM | POA: Diagnosis not present

## 2021-01-03 DIAGNOSIS — E782 Mixed hyperlipidemia: Secondary | ICD-10-CM

## 2021-01-03 DIAGNOSIS — E663 Overweight: Secondary | ICD-10-CM

## 2021-01-03 MED ORDER — INSULIN PEN NEEDLE 32G X 4 MM MISC: 3 refills | Status: AC

## 2021-01-03 MED ORDER — SITAGLIPTIN PHOSPHATE 100 MG PO TABS: 100.0000 mg | ORAL_TABLET | Freq: Every day | ORAL | 3 refills | Status: DC

## 2021-01-03 MED ORDER — METFORMIN HCL 1000 MG PO TABS: 1000.0000 mg | ORAL_TABLET | Freq: Two times a day (BID) | ORAL | 3 refills | Status: DC

## 2021-01-03 NOTE — Progress Notes (Signed)
Patient ID: Brian [name], male   DOB: 09/26/1935, 85 y.o.   MRN: [id number]   This visit occurred during the SARS-CoV-2 public health emergency.  Safety protocols were in place, including screening questions prior to the visit, additional usage of staff PPE, and extensive cleaning of exam room while observing appropriate contact time as indicated for disinfecting solutions.   HPI: Brian [name] is a 85 y.o.-year-old male,returning for f/u for DM2, dx in early 1990s, insulin-dependent, uncontrolled, with complications (cerebrovascular disease-s/p stroke 06/2018, diabetic retinopathy, ED). Last visit 6 months ago.  Interim history: He had and was diagnosed with diabetic retinopathy.  He denies blurry vision. He feels he gained some weight as he relaxed his diet.  Reviewed HbA1c levels: Lab Results  Component Value Date   HGBA1C 6.0 11/14/2020   HGBA1C 5.3 06/09/2020   HGBA1C 7.8 (A) 02/11/2020   HGBA1C 9.8 (H) 11/12/2019   HGBA1C 8.2 (A) 08/11/2019   HGBA1C 11.6 (H) 02/02/2019   HGBA1C 6.1 (H) 06/23/2018   HGBA1C 6.4 (H) 04/02/2018   HGBA1C 6.4 12/19/2017   HGBA1C 6.4 08/19/2017   HGBA1C 8.8 05/10/2017   HGBA1C 9.6 10/02/2016   HGBA1C 7.9 (H) 07/12/2016   HGBA1C 9.3 (H) 01/12/2016   HGBA1C 7.7 (H) 07/07/2015   HGBA1C 6.7 (H) 12/30/2014   HGBA1C 6.9 (H) 07/01/2014   HGBA1C 7.6 (H) 12/24/2013   HGBA1C 6.5 06/24/2013   HGBA1C 10.7 (H) 12/22/2012   He is on: - Metformin ER 1000 mg with dinner >> with breakfast >> 1000 mg 2x a day - Januvia 100 mg before brunch - Lantus 10 >> 26 >> 18 >> 20 >> 18 >> 14 units at bedtime He was on Amaryl >> dizziness, palpitations. We stopped Actos 05/2020.  He is checking sugars once a day per review of his log: - am:  76-125 on insulin, 160-186 off insulin >> 70-90, 128 >> 92-125, 135 - 2h after b'fast: n/c >> 134 >> n/c - before lunch: 190-220 >> 130s-140 >> 120s >> n/c - 2h after lunch: n/c - before dinner: n/c > 130s-140 >> 130s >> n/c >>  217 x1 - 2h after dinner: n/c - bedtime: n/c - nighttime: n/c Lowest sugar was 76 >> 70 >> 92; he has hypoglycemia awareness in the 70s. Highest sugar was 186 >> 128 >> 217.  Glucometer: Accu-Chek Aviva  -+ Mild CKD, last BUN/creatinine:  Lab Results  Component Value Date   BUN 18 11/14/2020   BUN 14 11/12/2019   CREATININE 0.99 11/14/2020   CREATININE 1.04 11/12/2019  On Cozaar. -+ HL; last set of lipids: Lab Results  Component Value Date   CHOL 125 11/14/2020   HDL 53.10 11/14/2020   LDLCALC 55 11/14/2020   TRIG 82.0 11/14/2020   CHOLHDL 2 11/14/2020  On Lipitor 80. - last eye exam was in 04/2020: + DR - no numbness and tingling in his feet.  On Neurontin.  ROS: Constitutional: + weight gain/no weight loss, no fatigue, no subjective hyperthermia, no subjective hypothermia Eyes: no blurry vision, no xerophthalmia ENT: no sore throat, no nodules palpated in neck, no dysphagia, no odynophagia, no hoarseness Cardiovascular: no CP/no SOB/no palpitations/no leg swelling Respiratory: no cough/no SOB/no wheezing Gastrointestinal: no N/no V/no D/no C/no acid reflux Musculoskeletal: no muscle aches/no joint aches Skin: + rash on right hand, no hair loss Neurological: no tremors/no numbness/no tingling/no dizziness  I reviewed pt's medications, allergies, PMH, social hx, family hx, and changes were documented in the history of present illness.  Otherwise, unchanged from my initial visit note.   Past Medical History:  Diagnosis Date  . ALLERGIC RHINITIS 10/13/2007  . ANEMIA, CHRONIC DISEASE NEC 03/31/2007  . BENIGN PROSTATIC HYPERTROPHY 03/31/2007  . Chest pain    Stress echo, normal, December, 2012  . Chronic anemia   . DIABETES MELLITUS, TYPE II 03/31/2007  . [place] DISEASE, LUMBAR 03/31/2007  . ED (erectile dysfunction)   . Ejection fraction    EF  normal, stress echo, December,  2012  . History of prostatitis   . HYPERLIPIDEMIA 03/31/2007  . HYPERTENSION 03/31/2007  .  Nephrolithiasis 12/07/2010  . Obesity   . POLYP, ANAL AND RECTAL 03/31/2007  . Right eye trauma    hx as child  . Stroke [facility])    Past Surgical History:  Procedure Laterality Date  . [place] SURGERY     1999  . PROSTATE BIOPSY     s/p  . RECTAL POLYPECTOMY     Transanal excision 10/2005  . SKIN BIOPSY     s/p right upper back 2009- benign Dr. Tonia Brooms   Social History   Social History  . Marital status: Married    Spouse name: N/A  . Number of children: 2   Occupational History  . [employer] administrator     Retired 1999   Social History Main Topics  . Smoking status: Never Smoker  . Smokeless tobacco: Not on file  . Alcohol use Yes  . Drug use: No   Current Outpatient Medications on File Prior to Visit  Medication Sig Dispense Refill  . acetaminophen (TYLENOL) 325 MG tablet Take 2 tablets (650 mg total) by mouth every 4 (four) hours as needed for mild pain or fever (or temp > 37.5 C (99.5 F)). 20 tablet 1  . amLODipine (NORVASC) 10 MG tablet TAKE 1 TABLET BY MOUTH EVERY DAY 90 tablet 2  . aspirin EC 81 MG EC tablet Take 1 tablet (81 mg total) by mouth daily. With breakfast 30 tablet 2  . atorvastatin (LIPITOR) 80 MG tablet TAKE 1 TABLET BY MOUTH EVERY DAY IN THE EVENING AT 6PM 90 tablet 1  . Blood Glucose Monitoring Suppl (ONETOUCH VERIO) w/Device KIT Use as directed twice daily E11.9 1 kit 0  . carvedilol (COREG) 12.5 MG tablet Take 1 tablet (12.5 mg total) by mouth 2 (two) times daily with a meal. 180 tablet 3  . ELIQUIS 5 MG TABS tablet TAKE 1 TABLET BY MOUTH TWICE A DAY 60 tablet 5  . gabapentin (NEURONTIN) 100 MG capsule TAKE 1 CAPSULE BY MOUTH THREE TIMES A DAY 270 capsule 1  . Insulin Glargine (BASAGLAR KWIKPEN) 100 UNIT/ML Inject 14 Units into the skin at bedtime. 15 mL 5  . Insulin Pen Needle 32G X 4 MM MISC Use 1x a day 100 each 3  . JANUVIA 100 MG tablet TAKE 1 TABLET BY MOUTH EVERY DAY 90 tablet 1  . KLOR-CON M20 20 MEQ tablet TAKE 1 TABLET (20 MEQ TOTAL) BY  MOUTH 2 (TWO) TIMES A WEEK. 25 tablet 3  . Lancets MISC Use as directed twice daily E11.9 200 each 3  . losartan (COZAAR) 100 MG tablet TAKE 1 TABLET BY MOUTH EVERY DAY 90 tablet 2  . metFORMIN (GLUCOPHAGE) 500 MG tablet TAKE 2 TABLETS (1,000 MG TOTAL) BY MOUTH 2 (TWO) TIMES DAILY WITH A MEAL. 360 tablet 3  . ONETOUCH VERIO test strip USE AS DIRECTED TWICE DAILY E11.9 200 strip 12  . OVER THE COUNTER MEDICATION Place  1 drop into both eyes daily as needed (dry eyes). Over the counter lubricating eye drop    . pioglitazone (ACTOS) 15 MG tablet TAKE 1 TABLET BY MOUTH EVERY DAY 90 tablet 3   No current facility-administered medications on file prior to visit.   No Known Allergies Family History  Problem Relation Age of Onset  . Colon polyps Mother   . Stroke Mother        due to ICA stenosis  . CVA Neg Hx   . Seizures Neg Hx    PE: BP 128/82 (BP Location: Left Arm, Patient Position: Sitting, Cuff Size: Normal)   Pulse 60   Ht 6' (1.829 m)   Wt 219 lb (99.3 kg)   SpO2 99%   BMI 29.70 kg/m  Wt Readings from Last 3 Encounters:  01/03/21 219 lb (99.3 kg)  12/06/20 222 lb 3.2 oz (100.8 kg)  11/14/20 218 lb (98.9 kg)   Constitutional: overweight, in NAD Eyes: PERRLA, EOMI, no exophthalmos ENT: moist mucous membranes, no thyromegaly, no cervical lymphadenopathy Cardiovascular: RRR, No MRG Respiratory: CTA B Gastrointestinal: abdomen soft, NT, ND, BS+ Musculoskeletal: no deformities, strength intact in all 4 Skin: moist, warm, + round 2 cm crusted lesion on right hand after he kicked out of Band-Aid in place for longer.  This appears to be healing.  He put antibiotic ointment on it. Neurological: no tremor with outstretched hands, DTR normal in all 4  ASSESSMENT: 1. DM2, insulin-dependent, uncontrolled, with complications - Cerebrovascular disease, status post stroke 06/2018 - DR - ED  2. HL  3. Overweight  PLAN:  1. Patient with longstanding, previously uncontrolled  diabetes, returning in 12/2018 after a long absence of 2 years.  Before this, he was doing great on a plant-based diet but then he relaxed his diet and stopped checking his sugars and ended up having a stroke in 06/2018.  His HbA1c checked in 01/2019 was very high, at 11.6%.  Sugars started to improve afterwards especially after adding insulin.  At last visit, we were able to stop Actos.  At that time, his HbA1c was excellent, at 5.3%.  Since then, he had another HbA1c checked almost 2 months ago and this was higher, at 6.0%, but still at goal. - At this visit, he only checks sugars in the morning.  These are at goal, but we discussed about continuing to check once a day, but trying to rotate the checks to include some sugars later in the day.  He checked once an hour after eating dinner and the blood sugar was down to 200s.  I advised him to continue to check at different times of the day.  However, for now, we will not change his regimen. - I suggested to:  Patient Instructions  Please continue: - Metformin ER 1000 mg 2x a day - Januvia 100 mg before b'fast - Lantus 14 units at bedtime  Try to check some sugars later in the day, also.  Please return in 4 months with your sugar log. - advised for yearly eye exams >> he is UTD - return to clinic in 4 months  2. HL -Reviewed latest lipid panel from 10/2020: All fractions at goal: Lab Results  Component Value Date   CHOL 125 11/14/2020   HDL 53.10 11/14/2020   LDLCALC 55 11/14/2020   TRIG 82.0 11/14/2020   CHOLHDL 2 11/14/2020  -Continues Lipitor 80 mg daily without side effects  3. Overweight -He was able to stop sweet drinks before last  visit -We had to add insulin, which is weight inducing, but we were able to stop Actos, which can cause weight gain -He gained approximately 6 pounds since last visit  Philemon Kingdom, MD PhD [facility] Endocrinology

## 2021-01-03 NOTE — Patient Instructions (Signed)
Please continue: - Metformin ER 1000 mg 2x a day - Januvia 100 mg before b'fast - Lantus 14 units at bedtime  Try to check some sugars later in the day, also.  Please return in 4 months with your sugar log.

## 2021-01-04 ENCOUNTER — Other Ambulatory Visit: Payer: Self-pay | Admitting: Internal Medicine

## 2021-01-08 ENCOUNTER — Other Ambulatory Visit: Payer: Self-pay | Admitting: Internal Medicine

## 2021-01-23 ENCOUNTER — Inpatient Hospital Stay (HOSPITAL_COMMUNITY): Payer: Medicare HMO

## 2021-01-23 ENCOUNTER — Other Ambulatory Visit: Payer: Self-pay

## 2021-01-23 ENCOUNTER — Encounter (HOSPITAL_COMMUNITY): Payer: Self-pay

## 2021-01-23 ENCOUNTER — Emergency Department (HOSPITAL_COMMUNITY): Payer: Medicare HMO

## 2021-01-23 ENCOUNTER — Inpatient Hospital Stay (HOSPITAL_COMMUNITY)
Admission: EM | Admit: 2021-01-23 | Discharge: 2021-02-28 | DRG: 813 | Disposition: A | Discharge status: Skilled Nursing Facility | Payer: Medicare HMO | Attending: Internal Medicine | Admitting: Internal Medicine

## 2021-01-23 DIAGNOSIS — Z7984 Long term (current) use of oral hypoglycemic drugs: Secondary | ICD-10-CM

## 2021-01-23 DIAGNOSIS — I480 Paroxysmal atrial fibrillation: Secondary | ICD-10-CM | POA: Diagnosis not present

## 2021-01-23 DIAGNOSIS — I10 Essential (primary) hypertension: Secondary | ICD-10-CM

## 2021-01-23 DIAGNOSIS — Z794 Long term (current) use of insulin: Secondary | ICD-10-CM

## 2021-01-23 DIAGNOSIS — R32 Unspecified urinary incontinence: Secondary | ICD-10-CM | POA: Diagnosis present

## 2021-01-23 DIAGNOSIS — H0589 Other disorders of orbit: Secondary | ICD-10-CM | POA: Diagnosis not present

## 2021-01-23 DIAGNOSIS — Z20822 Contact with and (suspected) exposure to covid-19: Secondary | ICD-10-CM | POA: Diagnosis present

## 2021-01-23 DIAGNOSIS — I119 Hypertensive heart disease without heart failure: Secondary | ICD-10-CM | POA: Diagnosis present

## 2021-01-23 DIAGNOSIS — D6832 Hemorrhagic disorder due to extrinsic circulating anticoagulants: Secondary | ICD-10-CM | POA: Diagnosis not present

## 2021-01-23 DIAGNOSIS — I160 Hypertensive urgency: Secondary | ICD-10-CM | POA: Diagnosis present

## 2021-01-23 DIAGNOSIS — I639 Cerebral infarction, unspecified: Secondary | ICD-10-CM | POA: Diagnosis not present

## 2021-01-23 DIAGNOSIS — M24521 Contracture, right elbow: Secondary | ICD-10-CM | POA: Diagnosis not present

## 2021-01-23 DIAGNOSIS — I629 Nontraumatic intracranial hemorrhage, unspecified: Secondary | ICD-10-CM | POA: Diagnosis not present

## 2021-01-23 DIAGNOSIS — D72825 Bandemia: Secondary | ICD-10-CM | POA: Diagnosis not present

## 2021-01-23 DIAGNOSIS — E669 Obesity, unspecified: Secondary | ICD-10-CM | POA: Diagnosis present

## 2021-01-23 DIAGNOSIS — E870 Hyperosmolality and hypernatremia: Secondary | ICD-10-CM | POA: Diagnosis not present

## 2021-01-23 DIAGNOSIS — R29810 Facial weakness: Secondary | ICD-10-CM | POA: Diagnosis not present

## 2021-01-23 DIAGNOSIS — J189 Pneumonia, unspecified organism: Secondary | ICD-10-CM

## 2021-01-23 DIAGNOSIS — I616 Nontraumatic intracerebral hemorrhage, multiple localized: Secondary | ICD-10-CM | POA: Diagnosis not present

## 2021-01-23 DIAGNOSIS — I615 Nontraumatic intracerebral hemorrhage, intraventricular: Secondary | ICD-10-CM | POA: Diagnosis not present

## 2021-01-23 DIAGNOSIS — E1165 Type 2 diabetes mellitus with hyperglycemia: Secondary | ICD-10-CM | POA: Diagnosis present

## 2021-01-23 DIAGNOSIS — Z79899 Other long term (current) drug therapy: Secondary | ICD-10-CM

## 2021-01-23 DIAGNOSIS — I517 Cardiomegaly: Secondary | ICD-10-CM | POA: Diagnosis not present

## 2021-01-23 DIAGNOSIS — I161 Hypertensive emergency: Secondary | ICD-10-CM | POA: Diagnosis present

## 2021-01-23 DIAGNOSIS — I482 Chronic atrial fibrillation, unspecified: Secondary | ICD-10-CM | POA: Diagnosis present

## 2021-01-23 DIAGNOSIS — J9811 Atelectasis: Secondary | ICD-10-CM | POA: Diagnosis not present

## 2021-01-23 DIAGNOSIS — R509 Fever, unspecified: Secondary | ICD-10-CM

## 2021-01-23 DIAGNOSIS — E782 Mixed hyperlipidemia: Secondary | ICD-10-CM | POA: Diagnosis present

## 2021-01-23 DIAGNOSIS — I4892 Unspecified atrial flutter: Secondary | ICD-10-CM | POA: Diagnosis present

## 2021-01-23 DIAGNOSIS — Z7901 Long term (current) use of anticoagulants: Secondary | ICD-10-CM

## 2021-01-23 DIAGNOSIS — R4701 Aphasia: Secondary | ICD-10-CM | POA: Diagnosis present

## 2021-01-23 DIAGNOSIS — G8191 Hemiplegia, unspecified affecting right dominant side: Secondary | ICD-10-CM | POA: Diagnosis present

## 2021-01-23 DIAGNOSIS — G936 Cerebral edema: Secondary | ICD-10-CM | POA: Diagnosis not present

## 2021-01-23 DIAGNOSIS — I610 Nontraumatic intracerebral hemorrhage in hemisphere, subcortical: Secondary | ICD-10-CM

## 2021-01-23 DIAGNOSIS — S0083XA Contusion of other part of head, initial encounter: Secondary | ICD-10-CM | POA: Diagnosis not present

## 2021-01-23 DIAGNOSIS — Z8673 Personal history of transient ischemic attack (TIA), and cerebral infarction without residual deficits: Secondary | ICD-10-CM | POA: Diagnosis not present

## 2021-01-23 DIAGNOSIS — I169 Hypertensive crisis, unspecified: Secondary | ICD-10-CM | POA: Diagnosis not present

## 2021-01-23 DIAGNOSIS — E1142 Type 2 diabetes mellitus with diabetic polyneuropathy: Secondary | ICD-10-CM | POA: Diagnosis present

## 2021-01-23 DIAGNOSIS — Z87891 Personal history of nicotine dependence: Secondary | ICD-10-CM

## 2021-01-23 DIAGNOSIS — R404 Transient alteration of awareness: Secondary | ICD-10-CM | POA: Diagnosis not present

## 2021-01-23 DIAGNOSIS — Z931 Gastrostomy status: Secondary | ICD-10-CM

## 2021-01-23 DIAGNOSIS — G319 Degenerative disease of nervous system, unspecified: Secondary | ICD-10-CM | POA: Diagnosis not present

## 2021-01-23 DIAGNOSIS — I471 Supraventricular tachycardia: Secondary | ICD-10-CM | POA: Diagnosis not present

## 2021-01-23 DIAGNOSIS — R58 Hemorrhage, not elsewhere classified: Secondary | ICD-10-CM | POA: Diagnosis not present

## 2021-01-23 DIAGNOSIS — I440 Atrioventricular block, first degree: Secondary | ICD-10-CM | POA: Diagnosis present

## 2021-01-23 DIAGNOSIS — L8990 Pressure ulcer of unspecified site, unspecified stage: Secondary | ICD-10-CM | POA: Insufficient documentation

## 2021-01-23 DIAGNOSIS — I619 Nontraumatic intracerebral hemorrhage, unspecified: Secondary | ICD-10-CM | POA: Diagnosis not present

## 2021-01-23 DIAGNOSIS — Z515 Encounter for palliative care: Secondary | ICD-10-CM | POA: Diagnosis not present

## 2021-01-23 DIAGNOSIS — L89152 Pressure ulcer of sacral region, stage 2: Secondary | ICD-10-CM | POA: Diagnosis not present

## 2021-01-23 DIAGNOSIS — N400 Enlarged prostate without lower urinary tract symptoms: Secondary | ICD-10-CM | POA: Diagnosis present

## 2021-01-23 DIAGNOSIS — Z8371 Family history of colonic polyps: Secondary | ICD-10-CM

## 2021-01-23 DIAGNOSIS — J309 Allergic rhinitis, unspecified: Secondary | ICD-10-CM | POA: Diagnosis present

## 2021-01-23 DIAGNOSIS — Z6830 Body mass index (BMI) 30.0-30.9, adult: Secondary | ICD-10-CM | POA: Diagnosis not present

## 2021-01-23 DIAGNOSIS — J690 Pneumonitis due to inhalation of food and vomit: Secondary | ICD-10-CM | POA: Diagnosis not present

## 2021-01-23 DIAGNOSIS — R519 Headache, unspecified: Secondary | ICD-10-CM | POA: Diagnosis not present

## 2021-01-23 DIAGNOSIS — D6489 Other specified anemias: Secondary | ICD-10-CM | POA: Diagnosis not present

## 2021-01-23 DIAGNOSIS — R9082 White matter disease, unspecified: Secondary | ICD-10-CM | POA: Diagnosis not present

## 2021-01-23 DIAGNOSIS — R059 Cough, unspecified: Secondary | ICD-10-CM

## 2021-01-23 DIAGNOSIS — R54 Age-related physical debility: Secondary | ICD-10-CM | POA: Diagnosis present

## 2021-01-23 DIAGNOSIS — I618 Other nontraumatic intracerebral hemorrhage: Secondary | ICD-10-CM | POA: Diagnosis present

## 2021-01-23 DIAGNOSIS — G9389 Other specified disorders of brain: Secondary | ICD-10-CM | POA: Diagnosis not present

## 2021-01-23 DIAGNOSIS — Q433 Congenital malformations of intestinal fixation: Secondary | ICD-10-CM | POA: Diagnosis not present

## 2021-01-23 DIAGNOSIS — R414 Neurologic neglect syndrome: Secondary | ICD-10-CM | POA: Diagnosis present

## 2021-01-23 DIAGNOSIS — N179 Acute kidney failure, unspecified: Secondary | ICD-10-CM | POA: Diagnosis not present

## 2021-01-23 DIAGNOSIS — T45515A Adverse effect of anticoagulants, initial encounter: Secondary | ICD-10-CM | POA: Diagnosis present

## 2021-01-23 DIAGNOSIS — I69391 Dysphagia following cerebral infarction: Secondary | ICD-10-CM | POA: Diagnosis not present

## 2021-01-23 DIAGNOSIS — I959 Hypotension, unspecified: Secondary | ICD-10-CM | POA: Diagnosis not present

## 2021-01-23 DIAGNOSIS — I6389 Other cerebral infarction: Secondary | ICD-10-CM | POA: Diagnosis not present

## 2021-01-23 DIAGNOSIS — D72829 Elevated white blood cell count, unspecified: Secondary | ICD-10-CM

## 2021-01-23 DIAGNOSIS — Z743 Need for continuous supervision: Secondary | ICD-10-CM | POA: Diagnosis not present

## 2021-01-23 DIAGNOSIS — K5900 Constipation, unspecified: Secondary | ICD-10-CM | POA: Diagnosis not present

## 2021-01-23 DIAGNOSIS — Z87442 Personal history of urinary calculi: Secondary | ICD-10-CM

## 2021-01-23 DIAGNOSIS — K869 Disease of pancreas, unspecified: Secondary | ICD-10-CM | POA: Diagnosis not present

## 2021-01-23 DIAGNOSIS — R1310 Dysphagia, unspecified: Secondary | ICD-10-CM | POA: Diagnosis not present

## 2021-01-23 DIAGNOSIS — R0600 Dyspnea, unspecified: Secondary | ICD-10-CM

## 2021-01-23 DIAGNOSIS — Z823 Family history of stroke: Secondary | ICD-10-CM

## 2021-01-23 DIAGNOSIS — R471 Dysarthria and anarthria: Secondary | ICD-10-CM | POA: Diagnosis present

## 2021-01-23 DIAGNOSIS — Z4682 Encounter for fitting and adjustment of non-vascular catheter: Secondary | ICD-10-CM | POA: Diagnosis not present

## 2021-01-23 DIAGNOSIS — Z7189 Other specified counseling: Secondary | ICD-10-CM | POA: Diagnosis not present

## 2021-01-23 DIAGNOSIS — H53461 Homonymous bilateral field defects, right side: Secondary | ICD-10-CM | POA: Diagnosis present

## 2021-01-23 DIAGNOSIS — N200 Calculus of kidney: Secondary | ICD-10-CM | POA: Diagnosis not present

## 2021-01-23 DIAGNOSIS — R0602 Shortness of breath: Secondary | ICD-10-CM | POA: Diagnosis not present

## 2021-01-23 DIAGNOSIS — J3489 Other specified disorders of nose and nasal sinuses: Secondary | ICD-10-CM | POA: Diagnosis not present

## 2021-01-23 DIAGNOSIS — R1312 Dysphagia, oropharyngeal phase: Secondary | ICD-10-CM | POA: Diagnosis present

## 2021-01-23 DIAGNOSIS — R29721 NIHSS score 21: Secondary | ICD-10-CM | POA: Diagnosis present

## 2021-01-23 DIAGNOSIS — Z7982 Long term (current) use of aspirin: Secondary | ICD-10-CM

## 2021-01-23 LAB — CBG MONITORING, ED
Glucose-Capillary: 132 mg/dL — ABNORMAL HIGH (ref 70–99)
Glucose-Capillary: 147 mg/dL — ABNORMAL HIGH (ref 70–99)

## 2021-01-23 LAB — CBC
HCT: 38.4 % — ABNORMAL LOW (ref 39.0–52.0)
Hemoglobin: 11.6 g/dL — ABNORMAL LOW (ref 13.0–17.0)
MCH: 26.9 pg (ref 26.0–34.0)
MCHC: 30.2 g/dL (ref 30.0–36.0)
MCV: 88.9 fL (ref 80.0–100.0)
Platelets: 185 10*3/uL (ref 150–400)
RBC: 4.32 MIL/uL (ref 4.22–5.81)
RDW: 13.4 % (ref 11.5–15.5)
WBC: 4.5 10*3/uL (ref 4.0–10.5)
nRBC: 0 % (ref 0.0–0.2)

## 2021-01-23 LAB — I-STAT CHEM 8, ED
BUN: 20 mg/dL (ref 8–23)
Calcium, Ion: 1.15 mmol/L (ref 1.15–1.40)
Chloride: 103 mmol/L (ref 98–111)
Creatinine, Ser: 1.2 mg/dL (ref 0.61–1.24)
Glucose, Bld: 146 mg/dL — ABNORMAL HIGH (ref 70–99)
HCT: 36 % — ABNORMAL LOW (ref 39.0–52.0)
Hemoglobin: 12.2 g/dL — ABNORMAL LOW (ref 13.0–17.0)
Potassium: 4.7 mmol/L (ref 3.5–5.1)
Sodium: 140 mmol/L (ref 135–145)
TCO2: 30 mmol/L (ref 22–32)

## 2021-01-23 LAB — COMPREHENSIVE METABOLIC PANEL
ALT: 21 U/L (ref 0–44)
AST: 23 U/L (ref 15–41)
Albumin: 4 g/dL (ref 3.5–5.0)
Alkaline Phosphatase: 55 U/L (ref 38–126)
Anion gap: 8 (ref 5–15)
BUN: 16 mg/dL (ref 8–23)
CO2: 27 mmol/L (ref 22–32)
Calcium: 9.4 mg/dL (ref 8.9–10.3)
Chloride: 104 mmol/L (ref 98–111)
Creatinine, Ser: 1.22 mg/dL (ref 0.61–1.24)
GFR, Estimated: 59 mL/min — ABNORMAL LOW (ref >60)
Glucose, Bld: 151 mg/dL — ABNORMAL HIGH (ref 70–99)
Potassium: 4.7 mmol/L (ref 3.5–5.1)
Sodium: 139 mmol/L (ref 135–145)
Total Bilirubin: 0.8 mg/dL (ref 0.3–1.2)
Total Protein: 6.7 g/dL (ref 6.5–8.1)

## 2021-01-23 LAB — DIFFERENTIAL
Abs Immature Granulocytes: 0.03 10*3/uL (ref 0.00–0.07)
Basophils Absolute: 0 10*3/uL (ref 0.0–0.1)
Basophils Relative: 1 %
Eosinophils Absolute: 0.2 10*3/uL (ref 0.0–0.5)
Eosinophils Relative: 4 %
Immature Granulocytes: 1 %
Lymphocytes Relative: 25 %
Lymphs Abs: 1.1 10*3/uL (ref 0.7–4.0)
Monocytes Absolute: 0.5 10*3/uL (ref 0.1–1.0)
Monocytes Relative: 11 %
Neutro Abs: 2.7 10*3/uL (ref 1.7–7.7)
Neutrophils Relative %: 58 %

## 2021-01-23 LAB — GLUCOSE, CAPILLARY: Glucose-Capillary: 129 mg/dL — ABNORMAL HIGH (ref 70–99)

## 2021-01-23 LAB — PROTIME-INR
INR: 1.3 — ABNORMAL HIGH (ref 0.8–1.2)
Prothrombin Time: 16.3 seconds — ABNORMAL HIGH (ref 11.4–15.2)

## 2021-01-23 LAB — APTT: aPTT: 32 seconds (ref 24–36)

## 2021-01-23 MED ORDER — PANTOPRAZOLE SODIUM 40 MG IV SOLR
40.0000 mg | Freq: Every day | INTRAVENOUS | Status: DC
  Administered 2021-01-23 – 2021-01-27 (×5): 40 mg via INTRAVENOUS
  Filled 2021-01-23 (×4): qty 40

## 2021-01-23 MED ORDER — INSULIN ASPART 100 UNIT/ML IJ SOLN
0.0000 [IU] | INTRAMUSCULAR | Status: DC
  Administered 2021-01-24 (×2): 2 [IU] via SUBCUTANEOUS
  Administered 2021-01-24: 3 [IU] via SUBCUTANEOUS
  Administered 2021-01-24 (×2): 2 [IU] via SUBCUTANEOUS
  Administered 2021-01-25: 3 [IU] via SUBCUTANEOUS
  Administered 2021-01-25: 8 [IU] via SUBCUTANEOUS
  Administered 2021-01-25: 3 [IU] via SUBCUTANEOUS
  Administered 2021-01-25: 2 [IU] via SUBCUTANEOUS
  Administered 2021-01-25 (×2): 3 [IU] via SUBCUTANEOUS
  Administered 2021-01-26: 8 [IU] via SUBCUTANEOUS
  Administered 2021-01-26 (×2): 11 [IU] via SUBCUTANEOUS

## 2021-01-23 MED ORDER — CLEVIDIPINE BUTYRATE 0.5 MG/ML IV EMUL
INTRAVENOUS | Status: AC
  Filled 2021-01-23: qty 50

## 2021-01-23 MED ORDER — STROKE: EARLY STAGES OF RECOVERY BOOK
Freq: Once | Status: DC
  Filled 2021-01-23 (×3): qty 1

## 2021-01-23 MED ORDER — LABETALOL HCL 5 MG/ML IV SOLN
INTRAVENOUS | Status: AC
  Administered 2021-01-23: 20 mg
  Filled 2021-01-23: qty 4

## 2021-01-23 MED ORDER — ATORVASTATIN CALCIUM 80 MG PO TABS: 80.0000 mg | ORAL_TABLET | Freq: Every evening | ORAL | Status: DC

## 2021-01-23 MED ORDER — SODIUM CHLORIDE 0.9 % IV SOLN: INTRAVENOUS | Status: AC

## 2021-01-23 MED ORDER — ACETAMINOPHEN 650 MG RE SUPP
650.0000 mg | RECTAL | Status: DC | PRN
  Administered 2021-01-25: 650 mg via RECTAL
  Filled 2021-01-23 (×2): qty 1

## 2021-01-23 MED ORDER — EMPTY CONTAINERS FLEXIBLE MISC
1800.0000 mg | Freq: Once | Status: AC
  Administered 2021-01-23: 1800 mg via INTRAVENOUS
  Filled 2021-01-23: qty 180

## 2021-01-23 MED ORDER — SODIUM CHLORIDE 0.9% FLUSH: 3.0000 mL | Freq: Once | INTRAVENOUS | Status: DC

## 2021-01-23 MED ORDER — LABETALOL HCL 5 MG/ML IV SOLN
INTRAVENOUS | Status: AC
  Filled 2021-01-23: qty 4

## 2021-01-23 MED ORDER — ACETAMINOPHEN 160 MG/5ML PO SOLN
650.0000 mg | ORAL | Status: DC | PRN
  Administered 2021-01-25 – 2021-02-26 (×16): 650 mg
  Filled 2021-01-23 (×16): qty 20.3

## 2021-01-23 MED ORDER — CLEVIDIPINE BUTYRATE 0.5 MG/ML IV EMUL
0.0000 mg/h | INTRAVENOUS | Status: DC
  Administered 2021-01-23: 8 mg/h via INTRAVENOUS
  Administered 2021-01-23: 1 mg/h via INTRAVENOUS
  Administered 2021-01-24: 19 mg/h via INTRAVENOUS
  Administered 2021-01-24 (×2): 16 mg/h via INTRAVENOUS
  Administered 2021-01-24: 10 mg/h via INTRAVENOUS
  Administered 2021-01-24: 13 mg/h via INTRAVENOUS
  Administered 2021-01-24: 21 mg/h via INTRAVENOUS
  Administered 2021-01-24: 14 mg/h via INTRAVENOUS
  Administered 2021-01-24 – 2021-01-25 (×2): 18 mg/h via INTRAVENOUS
  Administered 2021-01-25 (×3): 21 mg/h via INTRAVENOUS
  Administered 2021-01-25: 5 mg/h via INTRAVENOUS
  Administered 2021-01-25: 15 mg/h via INTRAVENOUS
  Administered 2021-01-26: 21 mg/h via INTRAVENOUS
  Administered 2021-01-26: 15 mg/h via INTRAVENOUS
  Administered 2021-01-26: 21 mg/h via INTRAVENOUS
  Administered 2021-01-26: 18 mg/h via INTRAVENOUS
  Administered 2021-01-26: 15 mg/h via INTRAVENOUS
  Filled 2021-01-23: qty 50
  Filled 2021-01-23 (×3): qty 100
  Filled 2021-01-23: qty 50
  Filled 2021-01-23: qty 100
  Filled 2021-01-23: qty 50
  Filled 2021-01-23 (×3): qty 100
  Filled 2021-01-23: qty 50
  Filled 2021-01-23: qty 100
  Filled 2021-01-23: qty 200
  Filled 2021-01-23: qty 100
  Filled 2021-01-23: qty 200
  Filled 2021-01-23: qty 100
  Filled 2021-01-23: qty 50
  Filled 2021-01-23 (×3): qty 100
  Filled 2021-01-23: qty 50

## 2021-01-23 MED ORDER — SENNOSIDES-DOCUSATE SODIUM 8.6-50 MG PO TABS: 1.0000 | ORAL_TABLET | Freq: Two times a day (BID) | ORAL | Status: DC

## 2021-01-23 MED ORDER — GABAPENTIN 100 MG PO CAPS: 100.0000 mg | ORAL_CAPSULE | Freq: Three times a day (TID) | ORAL | Status: DC

## 2021-01-23 MED ORDER — ACETAMINOPHEN 325 MG PO TABS: 650.0000 mg | ORAL_TABLET | ORAL | Status: DC | PRN

## 2021-01-23 NOTE — ED Provider Notes (Signed)
[facility] EMERGENCY DEPARTMENT Provider Note   CSN: [id number] Arrival date & time: 01/23/21  1521     History Chief Complaint  Patient presents with  . Code Stroke    Brian [name] is a 85 y.o. male.  HPI   This patient is an 85 year old male [place] he presented with right-sided weakness.  Evidently the patient drove himself to the carwash went to sit in a chair but was not able to get up from the chair, he required assistance and then noted that he had right-sided dense facial weakness, right arm and leg weakness and was unable to speak.  He was hypertensive, had normal heart rate, was not able to give them any information as he was not able to speak legibly.  Review of the medical record shows that this patient has a history of prior stroke and ischemia, he is currently on Eliquis for paroxysmal atrial fibrillation, it is unclear when he has been taking his medications, there is no family here with him.  Paramedics report no seizure activity though he did have some urinary incontinence.  I evaluated the patient at the triage bridge on EMS arrival immediately  Level 5 caveat applies.  Past Medical History:  Diagnosis Date  . ALLERGIC RHINITIS 10/13/2007  . ANEMIA, CHRONIC DISEASE NEC 03/31/2007  . BENIGN PROSTATIC HYPERTROPHY 03/31/2007  . Chest pain    Stress echo, normal, December, 2012  . Chronic anemia   . DIABETES MELLITUS, TYPE II 03/31/2007  . [place] DISEASE, LUMBAR 03/31/2007  . ED (erectile dysfunction)   . Ejection fraction    EF  normal, stress echo, December,  2012  . History of prostatitis   . HYPERLIPIDEMIA 03/31/2007  . HYPERTENSION 03/31/2007  . Nephrolithiasis 12/07/2010  . Obesity   . POLYP, ANAL AND RECTAL 03/31/2007  . Right eye trauma    hx as child  . Stroke [facility])     Patient Active Problem List   Diagnosis Date Noted  . Skin lesion of right leg 11/14/2020  . Uncontrolled type 2 diabetes mellitus with  circulatory disorder causing erectile dysfunction ([place]) 11/14/2020  . Bee sting reaction 07/10/2020  . Vitamin D deficiency 07/10/2020  . Right lumbar radiculopathy 10/20/2019  . Insomnia 01/31/2019  . Acute ischemic left ACA stroke ([place]) 06/23/2018  . History of CVA (cerebrovascular accident)   . PAF (paroxysmal atrial fibrillation) ([place])   . Diastolic dysfunction   . Acute CVA (cerebrovascular accident) ([place]) 04/09/2018  . Cerebral embolism with cerebral infarction 04/02/2018  . Right leg pain 12/04/2017  . Grief 12/04/2017  . Poorly controlled type 2 diabetes mellitus with circulatory disorder ([place]) 10/02/2016  . Allergic conjunctivitis 01/12/2016  . Abdominal pain, other specified site 06/23/2012  . Erectile dysfunction 12/13/2011  . Ejection fraction   . Fatigue 06/14/2011  . Encounter for well adult exam with abnormal findings 06/10/2011  . Nephrolithiasis 12/07/2010  . Hypertension, uncontrolled 12/07/2010  . SKIN LESION 03/01/2008  . ALLERGIC RHINITIS 10/13/2007  . Mixed hyperlipidemia 03/31/2007  . Overweight (BMI 25.0-29.9) 03/31/2007  . ANEMIA, CHRONIC DISEASE NEC 03/31/2007  . POLYP, ANAL AND RECTAL 03/31/2007  . BENIGN PROSTATIC HYPERTROPHY 03/31/2007  . [place] DISEASE, LUMBAR 03/31/2007    Past Surgical History:  Procedure Laterality Date  . [place] SURGERY     1999  . PROSTATE BIOPSY     s/p  . RECTAL POLYPECTOMY     Transanal excision 10/2005  . SKIN BIOPSY  s/p right upper back 2009- benign Dr. Tonia Brooms       Family History  Problem Relation Age of Onset  . Colon polyps Mother   . Stroke Mother        due to ICA stenosis  . CVA Neg Hx   . Seizures Neg Hx     Social History   Tobacco Use  . Smoking status: Former Smoker    Years: 10.00    Quit date: 1995    Years since quitting: 27.3  . Smokeless tobacco: Never Used  Vaping Use  . Vaping Use: Never used  Substance Use Topics  . Alcohol use: Yes    Alcohol/week: 1.0 standard drink     Types: 1 Cans of beer per week    Comment: occasional or yearly  . Drug use: No    Home Medications Prior to Admission medications   Medication Sig Start Date End Date Taking? Authorizing Provider  acetaminophen (TYLENOL) 325 MG tablet Take 2 tablets (650 mg total) by mouth every 4 (four) hours as needed for mild pain or fever (or temp > 37.5 C (99.5 F)). 06/27/18   Roxan Hockey, MD  amLODipine (NORVASC) 10 MG tablet TAKE 1 TABLET BY MOUTH EVERY DAY 01/09/21   Fay Records, MD  aspirin EC 81 MG EC tablet Take 1 tablet (81 mg total) by mouth daily. With breakfast 06/28/18   Roxan Hockey, MD  atorvastatin (LIPITOR) 80 MG tablet TAKE 1 TABLET BY MOUTH EVERY DAY IN THE EVENING AT 6PM 10/31/20   [name], MD  Blood Glucose Monitoring Suppl [facility] VERIO) w/Device KIT Use as directed twice daily E11.9 08/11/19   [name], MD  carvedilol (COREG) 12.5 MG tablet Take 1 tablet (12.5 mg total) by mouth 2 (two) times daily with a meal. 11/14/20   [name], MD  ELIQUIS 5 MG TABS tablet TAKE 1 TABLET BY MOUTH TWICE A DAY 09/05/20   Fay Records, MD  gabapentin (NEURONTIN) 100 MG capsule TAKE 1 CAPSULE BY MOUTH THREE TIMES A DAY 11/10/20   [name], MD  Insulin Glargine [facility] KWIKPEN) 100 UNIT/ML Inject 14 Units into the skin at bedtime. 06/09/20   Philemon Kingdom, MD  Insulin Pen Needle 32G X 4 MM MISC Use 1x a day 01/03/21   Philemon Kingdom, MD  KLOR-CON M20 20 MEQ tablet TAKE 1 TABLET (20 MEQ TOTAL) BY MOUTH 2 (TWO) TIMES A WEEK. 02/09/20   Fay Records, MD  Lancets MISC Use as directed twice daily E11.9 08/11/19   [name], MD  losartan (COZAAR) 100 MG tablet TAKE 1 TABLET BY MOUTH EVERY DAY 09/27/20   [name], MD  metFORMIN (GLUCOPHAGE) 1000 MG tablet Take 1 tablet (1,000 mg total) by mouth 2 (two) times daily with a meal. 01/03/21   Philemon Kingdom, MD  [facility] VERIO test strip USE AS DIRECTED TWICE DAILY E11.9 08/19/20   [name], MD  OVER THE COUNTER  MEDICATION Place 1 drop into both eyes daily as needed (dry eyes). Over the counter lubricating eye drop    [provider]  sitaGLIPtin (JANUVIA) 100 MG tablet Take 1 tablet (100 mg total) by mouth daily. 01/03/21   Philemon Kingdom, MD    Allergies    Patient has no known allergies.  Review of Systems   Review of Systems  All other systems reviewed and are negative.   Physical Exam Updated Vital Signs BP (!) 168/81  Pulse 63   Temp 97.8 F (36.6 C) (Oral)   Resp 14   Ht 1.829 m (6')   SpO2 98%   BMI 29.70 kg/m   Physical Exam Vitals and nursing note reviewed.  Constitutional:      Appearance: He is well-developed.     Comments: Acute distress - can't talk - L sided gaze preference  HENT:     Head: Normocephalic and atraumatic.     Mouth/Throat:     Mouth: Mucous membranes are moist.     Pharynx: No oropharyngeal exudate.  Eyes:     General: No scleral icterus.       Right eye: No discharge.        Left eye: No discharge.     Conjunctiva/sclera: Conjunctivae normal.     Pupils: Pupils are equal, round, and reactive to light.     Comments: L sided gauze deficit  Neck:     Thyroid: No thyromegaly.     Vascular: No JVD.  Cardiovascular:     Rate and Rhythm: Normal rate and regular rhythm.     Heart sounds: Normal heart sounds. No murmur heard. No friction rub. No gallop.   Pulmonary:     Effort: Pulmonary effort is normal. No respiratory distress.     Breath sounds: Normal breath sounds. No wheezing or rales.  Abdominal:     General: Bowel sounds are normal. There is no distension.     Palpations: Abdomen is soft. There is no mass.     Tenderness: There is no abdominal tenderness.  Musculoskeletal:        General: No tenderness. Normal range of motion.     Cervical back: Normal range of motion and neck supple.     Right lower leg: No edema.     Left lower leg: No edema.  Lymphadenopathy:     Cervical: No cervical adenopathy.  Skin:    General:  Skin is warm and dry.     Findings: No erythema or rash.  Neurological:     Cranial Nerves: Cranial nerve deficit present.     Motor: Weakness present.     Coordination: Coordination abnormal.     Comments: Has obvious R sided facial droop - R arm and leg weakness - garbled speech - L sided gaze preference     ED Results / Procedures / Treatments   Labs (all labs ordered are listed, but only abnormal results are displayed) Labs Reviewed  I-STAT CHEM 8, ED - Abnormal; Notable for the following components:      Result Value   Glucose, Bld 146 (*)    Hemoglobin 12.2 (*)    HCT 36.0 (*)    All other components within normal limits  CBG MONITORING, ED - Abnormal; Notable for the following components:   Glucose-Capillary 132 (*)    All other components within normal limits  PROTIME-INR  APTT  CBC  DIFFERENTIAL  COMPREHENSIVE METABOLIC PANEL    EKG None  Radiology CT HEAD CODE STROKE WO CONTRAST  Result Date: 01/23/2021 CLINICAL DATA:  Code stroke.  Left-sided facial droop EXAM: CT HEAD WITHOUT CONTRAST TECHNIQUE: Contiguous axial images were obtained from the base of the skull through the vertex without intravenous contrast. COMPARISON:  2019 FINDINGS: Brain: There is acute hemorrhage centered within the left thalamus measuring 2.6 x 2.8 x 3 cm. Surrounding edema is present. There is partial effacement of the third ventricle. Intraventricular extension is present with hemorrhage seen within the left lateral and  third ventricles. Gray-white differentiation is preserved. Patchy and confluent areas of hypoattenuation in the supratentorial white matter are nonspecific but probably reflect moderate chronic microvascular ischemic changes. Small chronic right cerebellar infarct. Prominence of the ventricles and sulci reflects generalized parenchymal volume loss. Vascular: There is intracranial atherosclerotic calcification at the skull base. Skull: Unremarkable. Sinuses/Orbits: Paranasal sinus  mucosal thickening. No acute orbital abnormality. Other: Mastoid air cells are clear. IMPRESSION: Acute left thalamic hemorrhage with mild regional mass effect and intraventricular extension. No hydrocephalus. Chronic microvascular ischemic changes. Small chronic right cerebellar infarct. Initial results were communicated to Dr. Lorrin Goodell at 3:42 pm on 01/23/2021 by text page via the [facility] messaging system. Electronically Signed   By: Macy Mis M.D.   On: 01/23/2021 15:45    Procedures .Critical Care Performed by: Noemi Chapel, MD Authorized by: Noemi Chapel, MD   Critical care provider statement:    Critical care time (minutes):  35   Critical care time was exclusive of:  Separately billable procedures and treating other patients and teaching time   Critical care was necessary to treat or prevent imminent or life-threatening deterioration of the following conditions:  CNS failure or compromise   Critical care was time spent personally by me on the following activities:  Blood draw for specimens, development of treatment plan with patient or surrogate, discussions with consultants, evaluation of patient's response to treatment, examination of patient, obtaining history from patient or surrogate, ordering and performing treatments and interventions, ordering and review of laboratory studies, ordering and review of radiographic studies, pulse oximetry, re-evaluation of patient's condition and review of old charts     Medications Ordered in ED Medications  sodium chloride flush (NS) 0.9 % injection 3 mL (has no administration in time range)  coag fact Xa recombinant (ANDEXXA) high dose infusion 1800 mg (has no administration in time range)  clevidipine (CLEVIPREX) 0.5 MG/ML infusion (has no administration in time range)  labetalol (NORMODYNE) 5 MG/ML injection (has no administration in time range)    ED Course  I have reviewed the triage vital signs and the nursing notes.  Pertinent  labs & imaging results that were available during my care of the patient were reviewed by me and considered in my medical decision making (see chart for details).    MDM Rules/Calculators/A&P                           This patient presents to the ED for concern of stroke, possible hemorrhage, possible ischemia, this involves an extensive number of treatment options, and is a complaint that carries with it a high risk of complications and morbidity.  The differential diagnosis includes stroke, ischemia, seizure   Lab Tests:  I Ordered, reviewed, and interpreted labs, which included CBC, metabolic panel, EKG.  Chemistry shows mild hyperglycemia at 146, normal hemoglobin EKG with left ventricular hypertrophy, no signs of acute ischemia  Medicines ordered:   I ordered medication antihypertensives and reversal medications for Eliquis for intracranial hemorrhage  Imaging Studies ordered:   I ordered imaging studies which included CT scan of the brain and  I independently visualized and interpreted imaging which showed intracranial hemorrhage  Additional history obtained:   Additional history obtained from EMS  Previous records obtained and reviewed prior stroke, prior paroxysmal atrial fibrillation  Consultations Obtained:   I consulted neurology who was also present at the bedside and went to CT scan with the patient, unfortunately this patient is critically ill,  has intracranial hemorrhage, is hypertensive and will need to be in the ICU tonight, he is currently getting a blood pressure control with a drip of a titratable antihypertensive, he is having Eliquis reversed, thankfully he is protecting his airway and does not need to be intubated at this time  Reevaluation:  After the interventions stated above, I reevaluated the patient and found no significant changes, the patient is still critically ill  Critical Interventions:  . Reversing anticoagulant . Cardiac monitoring,  neurologic reevaluations . Consultation with neurology . Blood pressure control with titratable antihypertensive . ICU admission   Final Clinical Impression(s) / ED Diagnoses Final diagnoses:  Hemorrhagic stroke ([place])  Primary hypertension      Noemi Chapel, MD 01/23/21 1600

## 2021-01-23 NOTE — ED Triage Notes (Signed)
Pt brought in by EMS foe a possible stroke. Pt was at the car wash and started c/o right sided weakness and confusion.

## 2021-01-23 NOTE — ED Notes (Signed)
Belongs for patient given to brother [name].  [name] is taking home with him the following belongings: 1. Glasses 2. Cane 3. Clothing - shirt, pants and white shoes 4. Hat 5. Wallet  6. Cell Phone

## 2021-01-23 NOTE — H&P (Signed)
[facility] and P NOTE   Date of service: Jan 23, 2021 Patient Name: Brian [name] MRN:  [id number] DOB:  [birth year] Requesting Provider: Noemi Chapel, MD _ _ _   _ __   _ __ _ _  __ __   _ __   __ _  History of Present Illness  Brian [name] is a 85 y.o. male with PMH significant for Afibb on eliquis, DM2, HLD, HTN, obesity, prior L MCA stroke who presents with acute onset R sided weakness + R facial droop + R sided numbness + aphasia with left gaze preference. He is unable to provide any meaningful hx secondary to global aphasia.  Per EMS, was at a car wash, he was talking to the staff there and started having acute onset R sided weakness and language deficit. LKW of Brian [name] on 01/23/21.  Workup with CTH w/o contrast with a L thalamic ICH.  I was unable to get in touch with the listed family members Brian [name] or Brian [name].  LKW: 1445 NIHSS: 21 MRS: Presumed to be 0 given he was walking at the car wash and drove there himself. ICH score: 2(Age > 80 and some Intraventricular extension)   ROS   Unable to obtain secondary to aphasia.  Past History   Past Medical History:  Diagnosis Date  . ALLERGIC RHINITIS 10/13/2007  . ANEMIA, CHRONIC DISEASE NEC 03/31/2007  . BENIGN PROSTATIC HYPERTROPHY 03/31/2007  . Chest pain    Stress echo, normal, December, 2012  . Chronic anemia   . DIABETES MELLITUS, TYPE II 03/31/2007  . [place] DISEASE, LUMBAR 03/31/2007  . ED (erectile dysfunction)   . Ejection fraction    EF  normal, stress echo, December,  2012  . History of prostatitis   . HYPERLIPIDEMIA 03/31/2007  . HYPERTENSION 03/31/2007  . Nephrolithiasis 12/07/2010  . Obesity   . POLYP, ANAL AND RECTAL 03/31/2007  . Right eye trauma    hx as child  . Stroke [facility])    Past Surgical History:  Procedure Laterality Date  . [place] SURGERY     1999  . PROSTATE BIOPSY     s/p  . RECTAL POLYPECTOMY     Transanal excision 10/2005  . SKIN BIOPSY     s/p right upper back 2009- benign Dr. Tonia Brooms    Family History  Problem Relation Age of Onset  . Colon polyps Mother   . Stroke Mother        due to ICA stenosis  . CVA Neg Hx   . Seizures Neg Hx    Social History   Socioeconomic History  . Marital status: Widowed    Spouse name: Not on file  . Number of children: 2  . Years of education: Not on file  . Highest education level: Not on file  Occupational History  . Occupation: Acupuncturist    Comment: Retired 1999  Tobacco Use  . Smoking status: Former Smoker    Years: 10.00    Quit date: 1995    Years since quitting: 27.3  . Smokeless tobacco: Never Used  Vaping Use  . Vaping Use: Never used  Substance and Sexual Activity  . Alcohol use: Yes    Alcohol/week: 1.0 standard drink    Types: 1 Cans of beer per week    Comment: occasional or yearly  . Drug use: No  . Sexual activity: Not on file  Other Topics Concern  . Not on file  Social History Narrative  .  Not on file   Social Determinants of Health   Financial Resource Strain: Not on file  Food Insecurity: Not on file  Transportation Needs: Not on file  Physical Activity: Not on file  Stress: Not on file  Social Connections: Not on file   No Known Allergies  Medications  (Not in a hospital admission)    Vitals   Vitals:   01/23/21 1552 01/23/21 1554 01/23/21 1556 01/23/21 1600  BP: (!) 153/71 (!) 147/68 138/70 133/66  Pulse: 66 60 66 63  Resp: 17 11 12 15   Temp:      TempSrc:      SpO2: 98% 97% 98%   Height:         Body mass index is 29.7 kg/m.  Physical Exam   General: Laying comfortably in bed; in no acute distress. HENT: Normal oropharynx and mucosa. Normal external appearance of ears and nose. Neck: Supple, no pain or tenderness CV: No JVD. No peripheral edema. Pulmonary: Symmetric Chest rise. Normal respiratory effort. Abdomen: Soft to touch, non-tender. Ext: No cyanosis, edema, or deformity Skin: No rash. Normal palpation of skin.  Musculoskeletal: Normal digits and  nails by inspection. No clubbing.  Neurologic Examination  Mental status/Cognition: Awake, Alert, unable to answer orientation questions. Speech/language: mute mostly does attempt to mimic but unable to answer any questions or follow any commands. Did say "I dont know" and speech was mildly dysarthric.  Cranial nerves:   CN II Pupils equal and reactive to light, ? Partial R hemianopsia.   CN III,IV,VI L gaze preference, does cross midline.   CN V No response to scratch on the R face.   CN VII R facial droop   CN VIII normal hearing to speech turns towards speech.   CN IX & X    CN XI    CN XII midline tongue protrusion   Motor:  Muscle bulk: normal, tone normal Unable to do full strength testing secondary to aphasia. Moves LUE and LLE spontaneously. Minimal movement in RUE, does withdraw RLE when eliciting planter reflex.  Reflexes:  Right Left Comments  Pectoralis      Biceps (C5/6) 2 2   Brachioradialis (C5/6) 2 2    Triceps (C6/7) 2 2    Patellar (L3/4) 2 2    Achilles (S1)      Hoffman      Plantar     Jaw jerk    Sensation: No response to noxious stimuli in RUE and RLE.  Coordination/Complex Motor:  No obvious ataxia or incorrdination.  Labs   CBC:  Recent Labs  Lab 01/23/21 1537 01/23/21 1543  WBC 4.5  --   NEUTROABS 2.7  --   HGB 11.6* 12.2*  HCT 38.4* 36.0*  MCV 88.9  --   PLT 185  --     Basic Metabolic Panel:  Lab Results  Component Value Date   NA 140 01/23/2021   K 4.7 01/23/2021   CO2 30 11/14/2020   GLUCOSE 146 (H) 01/23/2021   BUN 20 01/23/2021   CREATININE 1.20 01/23/2021   CALCIUM 10.2 11/14/2020   GFRNONAA >60 06/22/2018   GFRAA >60 06/22/2018   Lipid Panel:  Lab Results  Component Value Date   LDLCALC 55 11/14/2020   HgbA1c:  Lab Results  Component Value Date   HGBA1C 6.0 11/14/2020   Urine Drug Screen:     Component Value Date/Time   LABOPIA NONE DETECTED 04/01/2018 1148   COCAINSCRNUR NONE DETECTED 04/01/2018 1148  LABBENZ NONE DETECTED 04/01/2018 1148   AMPHETMU NONE DETECTED 04/01/2018 1148   THCU NONE DETECTED 04/01/2018 1148   LABBARB (A) 04/01/2018 1148    Result not available. Reagent lot number recalled by manufacturer.    Alcohol Level No results found for: [facility]  CT Head without contrast: Acute left thalamic hemorrhage with mild regional mass effect and intraventricular extension. No hydrocephalus.  Chronic microvascular ischemic changes. Small chronic right cerebellar infarct.  MR Angio head without contrast and Carotid Duplex BL: pending  MRI Brain with and without contrast: Pending.  Impression   Brian [name] is a 85 y.o. male presenting with L Thalamic ICH with some IVH and an ICH score of 2. Unable to provide any history secondary to the stroke but given Eliquis listed on home medications, suspect the ICH is secondary to Eliquis. He was given Andexanet alpha for presumed ICH on Eliquis.   Primary Diagnosis:  Intracerebral Hemorrhage secondary to Apixaban.  Secondary Diagnosis: Cerebral edema, Hypertension Emergency (SBP > 180 or DBP > 120 & end organ damage), Paroxysmal atrial fibrillation, Type 2 diabetes mellitus with hyperglycemia  and Obesity  Recommendations  - Admit to ICU - reversal with Andexanet Alpha. - Stability scan in 6 hours or STAT with any neurological decline - Frequent neuro checks; q65min for 1 hour, then q1hour - No antiplatelets or anticoagulants due to [place] - SCD for DVT prophylaxis for 24 hours. - Blood pressure control with goal systolic 081 - 448, cleverplex and labetalol PRN - Stroke labs, HgbA1c, fasting lipid panel - MRI brain with and without contrast when stabilized to evaluate for underlying mass - MRA without contrast of the brain to evaluate for underlying vascular lesion along with MRA neck. - Risk factor modification - Echocardiogram - PT consult, OT consult, Speech consult when patient stabilized  - Stroke team to follow  DM2: SSI  with Q4H glucose checks.  HTN: Goal SBP as above Cleviprex gtt prn for SBP > goal.   HLD: Continue home Atorvastatin 80mg  daily  Peripheral neuropathy: - continue home Gabapentin.  ______________________________________________________________________  This patient is critically ill and at significant risk of neurological worsening, death and care requires constant monitoring of vital signs, hemodynamics,respiratory and cardiac monitoring, neurological assessment, discussion with family, other specialists and medical decision making of high complexity. I spent 70 minutes of neurocritical care time  in the care of  this patient. This was time spent independent of any time provided by nurse practitioner or PA.  Donnetta Simpers [facility] Pager Number [phone] 01/23/2021  4:33 PM   Thank you for the opportunity to take part in the care of this patient. If you have any further questions, please contact the neurology consultation attending.  Signed,  [place] Pager Number [phone] _ _ _   _ __   _ __ _ _  __ __   _ __   __ _

## 2021-01-23 NOTE — Progress Notes (Signed)
Patient arrived to unit at 2245 from ED with no complications. Patient has 1 gold ring on L hand. Belongings at bedside are: belt, watch, and bracelet.

## 2021-01-23 NOTE — Code Documentation (Signed)
Patient was at a car wash inside waiting when employees found him with garbeled speech, facial droop and right-sided weakness. [facility] arrived and activated the code stroke. Pt's BP in the 170's and CBG wnl. Pt taken to [facility] and cleared by EDP for CT scan. NIHSS 21 (see stroke documentation for details). Pt met by the stroke team in CT. CT results below showing a left thalamic hemorrhage. Clevidipine gtt started to control blood pressure and keep systolic BP <287. Andexxa also ordered for reversal. Pt was taking Eliquis. Pharmacy to deliver reversal to bedside when it is mixed. Care Plan: BP <140, MRI, Q1 hour neuro checks/vitals and pupils. 4N ICU charge RN made aware of bed need. Hand off with [place], RN.   "IMPRESSION: Acute left thalamic hemorrhage with mild regional mass effect and intraventricular extension. No hydrocephalus.  Chronic microvascular ischemic changes. Small chronic right cerebellar infarct."    Beronica Lansdale, Rande Brunt, RN  Stroke Response Nurse

## 2021-01-23 NOTE — ED Notes (Signed)
Lab to add on A1c 

## 2021-01-23 NOTE — Progress Notes (Signed)
Per EMS- pt's car and keys are at [address], [place], [place].

## 2021-01-23 NOTE — ED Notes (Signed)
Pt transported over to MRI 

## 2021-01-24 ENCOUNTER — Inpatient Hospital Stay (HOSPITAL_COMMUNITY): Payer: Medicare HMO

## 2021-01-24 DIAGNOSIS — I610 Nontraumatic intracerebral hemorrhage in hemisphere, subcortical: Secondary | ICD-10-CM | POA: Diagnosis not present

## 2021-01-24 DIAGNOSIS — I6389 Other cerebral infarction: Secondary | ICD-10-CM | POA: Diagnosis not present

## 2021-01-24 LAB — ECHOCARDIOGRAM COMPLETE
Area-P 1/2: 3.12 cm2
Calc EF: 59.9 %
Height: 72 in
S' Lateral: 3 cm
Single Plane A2C EF: 60.8 %
Single Plane A4C EF: 57.2 %
Weight: 3449.76 oz

## 2021-01-24 LAB — HEMOGLOBIN A1C
Hgb A1c MFr Bld: 5.8 % — ABNORMAL HIGH (ref 4.8–5.6)
Mean Plasma Glucose: 120 mg/dL

## 2021-01-24 LAB — GLUCOSE, CAPILLARY
Glucose-Capillary: 113 mg/dL — ABNORMAL HIGH (ref 70–99)
Glucose-Capillary: 127 mg/dL — ABNORMAL HIGH (ref 70–99)
Glucose-Capillary: 130 mg/dL — ABNORMAL HIGH (ref 70–99)
Glucose-Capillary: 141 mg/dL — ABNORMAL HIGH (ref 70–99)
Glucose-Capillary: 151 mg/dL — ABNORMAL HIGH (ref 70–99)
Glucose-Capillary: 175 mg/dL — ABNORMAL HIGH (ref 70–99)

## 2021-01-24 LAB — MRSA PCR SCREENING: MRSA by PCR: NEGATIVE

## 2021-01-24 LAB — SARS CORONAVIRUS 2 (TAT 6-24 HRS): SARS Coronavirus 2: NEGATIVE

## 2021-01-24 MED ORDER — LABETALOL HCL 5 MG/ML IV SOLN
10.0000 mg | INTRAVENOUS | Status: DC | PRN
  Administered 2021-01-24 – 2021-01-27 (×8): 10 mg via INTRAVENOUS
  Filled 2021-01-24 (×8): qty 4

## 2021-01-24 MED ORDER — LABETALOL HCL 5 MG/ML IV SOLN
20.0000 mg | INTRAVENOUS | Status: DC | PRN
  Administered 2021-01-24: 20 mg via INTRAVENOUS
  Filled 2021-01-24: qty 4

## 2021-01-24 MED ORDER — LABETALOL HCL 5 MG/ML IV SOLN: 20.0000 mg | INTRAVENOUS | Status: DC | PRN

## 2021-01-24 MED ORDER — HYDRALAZINE HCL 20 MG/ML IJ SOLN
10.0000 mg | INTRAMUSCULAR | Status: DC | PRN
  Administered 2021-01-24 – 2021-01-30 (×16): 10 mg via INTRAVENOUS
  Filled 2021-01-24 (×16): qty 1

## 2021-01-24 MED ORDER — HYDRALAZINE HCL 20 MG/ML IJ SOLN: 10.0000 mg | INTRAMUSCULAR | Status: DC | PRN

## 2021-01-24 MED ORDER — DILTIAZEM LOAD VIA INFUSION
10.0000 mg | Freq: Once | INTRAVENOUS | Status: DC
  Filled 2021-01-24: qty 10

## 2021-01-24 MED ORDER — CHLORHEXIDINE GLUCONATE CLOTH 2 % EX PADS
6.0000 | MEDICATED_PAD | Freq: Every day | CUTANEOUS | Status: DC
  Administered 2021-01-24 – 2021-02-14 (×21): 6 via TOPICAL

## 2021-01-24 MED ORDER — DILTIAZEM HCL-DEXTROSE 125-5 MG/125ML-% IV SOLN (PREMIX)
5.0000 mg/h | INTRAVENOUS | Status: DC
  Filled 2021-01-24: qty 125

## 2021-01-24 NOTE — Progress Notes (Signed)
STROKE TEAM PROGRESS NOTE   INTERVAL HISTORY No acute events.  Inattentive to examiners, aphasic.  Resists exam, tried to cover self with blankets. Not following commands. Spontaneously and  purposefully moving right side. Remains on cleviprex infusion for blood pressure control. Nursing reports some transient tachycardia last pm for which cardizem gtt was ordered overnight but never started as the tachycardia resolved.   Vitals:   01/24/21 0630 01/24/21 0700 01/24/21 0800 01/24/21 0900  BP: 135/85 117/63 (!) 147/78 (!) 154/62  Pulse: 72 71 77 68  Resp: 15 15 (!) 28 11  Temp:   99 F (37.2 C)   TempSrc:   Axillary   SpO2: 95% 91% 92% 92%  Weight:      Height:       CBC:  Recent Labs  Lab 01/23/21 1537 01/23/21 1543  WBC 4.5  --   NEUTROABS 2.7  --   HGB 11.6* 12.2*  HCT 38.4* 36.0*  MCV 88.9  --   PLT 185  --    Basic Metabolic Panel:  Recent Labs  Lab 01/23/21 1537 01/23/21 1543  NA 139 140  K 4.7 4.7  CL 104 103  CO2 27  --   GLUCOSE 151* 146*  BUN 16 20  CREATININE 1.22 1.20  CALCIUM 9.4  --    Recent Labs  Lab 01/23/21 1542  HGBA1C 5.8*   Lipid panel is pending  IMAGING and DIAGNOSTICS CT head: repeat on 5/9 Stable appearing left thalamic hemorrhage with intraventricular component. No new focal abnormality is noted.  MRI Brain 1. Motion degraded exam. 2. 3.0 x 3.1 x 3.4 cm acute intraparenchymal hemorrhage centered at the left thalamus. Surrounding vasogenic edema with mild regional mass effect and 5 mm of left-to-right shift. No visible underlying mass lesion or other structure abnormality on this motion degraded exam. 3. Associated intraventricular extension with blood within the left greater than right lateral ventricles. No hydrocephalus or ventricular trapping at this time. 4. Underlying age-related cerebral atrophy with mild chronic small vessel ischemic disease, with a few small remote right cerebellar infarcts.  MRA HEAD 1.  Technically limited exam due to extensive motion artifact. 2. Grossly negative intracranial MRA. No large vessel occlusion, hemodynamically significant stenosis, or other acute vascular abnormality. No vascular abnormality seen underlying the acute left thalamic hemorrhage. Please note that while a MRA neck was also ordered for this exam, this was unable to be performed due to the patient's inability to tolerate the full length of the study and extensive motion artifact  CT Head Code Stroke 5/9 Acute left thalamic hemorrhage with mild regional mass effect and intraventricular extension. No hydrocephalus. Chronic microvascular ischemic changes. Small chronic right cerebellar infarct.  PHYSICAL EXAM  General:  Mildly obese elderly African-American male lying in bed, somewhat restless, resists exam.  HENT: Normal oropharynx and mucosa. Normal external appearance of ears and nose. Neck: Supple, no pain or tenderness CV: No JVD. No peripheral edema. Pulmonary: Symmetric Chest rise. Normal respiratory effort. Abdomen: Soft to touch, non-tender. Ext: No cyanosis, edema, or deformity Skin: No rash. Normal palpation of skin.  Musculoskeletal: Normal digits and nails by inspection. No clubbing.  Neurologic Examination  Mental status/Cognition: Keeps eyes closed, Inattentive to examiner. Restless. Does not follow commands.  Speech/language: No verbal response to verbal or tactile stimuli  Cranial nerves:   CN II Pupils equal and reactive to light, ? Partial R hemianopsia.   CN III,IV,VI L gaze preference, does cross midline.   CN V No response  to scratch on the R face.   CN VII R facial droop   CN VIII normal hearing to speech turns towards speech.   CN IX & X    CN XI    CN XII midline tongue protrusion   Motor:  Muscle bulk: normal, tone normal Unable to do full strength testing secondary to aphasia. Moves LUE and LLE spontaneously. Minimal movement in RUE, does withdraw RLE  when eliciting planter reflex.  Only trace withdrawal in the right upper extremity.  Tone is increased on the right side.  Gait not tested.  ASSESSMENT/PLAN Brian [name] is a 85 y.o. male presenting with L Thalamic ICH with some IVH and an ICH score of 2. Unable to provide any history secondary to the stroke but given Eliquis listed on home medications, suspect the ICH is secondary to Eliquis. He is s/p Andexanet alpha for presumed ICH on Eliquis.  ICH with associated cerebral edema in the setting hypertensive emergency and Apixaban therapy for paroxysmal atrial fibrillation.   Plan for repeat HCT 5/11 pending  Code Stroke: Acute left thalamic hemorrhage with regional mass effect and IVH  MRI head: 3.0 x 3.1 x 3.4 cm acute intraparenchymal hemorrhage centered at the left thalamus. Surrounding vasogenic edema with mild regional mass effect and 5 mm of left-to-right shift.  MRA head: Grossly negative, No LVO  MRA neck: unable to complete due to extensive motion  2D Echo: Pending  VTE prophylaxis - PPX    Diet   Diet NPO time specified     On Eliquis for known Aflutter prior to admission. On hold due to hemorrhage. No anticoagulant or antiplatelets for now.   Therapy recommendations:  SNF  Disposition:  TBD  Atrial fibrillation  Home medication: Eliquis   Followed by Dr. Harrington Challenger  2019 Echo showed EF 45-50% with left atrium moderately dilated   Hypertension  Home meds: Norvasc 10, Coreg 12.5, Cozaar 100mg    Unstable, hypertensive emergency on arrival. Requiring cleviprex infusion   Hyperlipidemia  Home meds:  Lipitor 80mg   LDL at 55, goal < 70  High intensity statin: at goal with  home regimen. Hold statin in setting of hemorrhage.   Consider continuing statin at discharge  DM2, controlled  Home meds are glargine 14 u hs, metformin 1000 BID, Januvia 100 daily  HgbA1c 5.8, goal < 7.0  CBGs Recent Labs    01/24/21 0345 01/24/21 0816 01/24/21 1200  GLUCAP  151* 130* 127*      SSI  NPO  Other Stroke Risk Factors  Advanced Age >/= 42   Former Cigarette smoker quit 1995.   Current ETOH use, advised to drink no more than 2 drink(s) a day  Overweight, Body mass index is 29.24 kg/m., BMI >/= 30 associated with increased stroke risk, recommend weight loss, diet and exercise as appropriate   Hx stroke/TIA  Other Active Problems    Hospital day # 1 Patient presented with aphasia and right hemiparesis due to left basal ganglia hemorrhage likely related to hypertension in the setting of anticoagulation with Eliquis which was reversed with Andexxa.  He remains at risk for neurological worsening and hematoma expansion.  Continue strict blood pressure control with systolic goal below 086 for the first 24 hours and then below 160.  Use as needed IV hydralazine and labetalol and wean Cleviprex drip as tolerated.  Repeat CT scan of the head tomorrow morning.  Mobilize out of bed.  Therapy consults.  Long discussion the patient and his brother  at the bedside and answered questions about his care. This patient is critically ill and at significant risk of neurological worsening, death and care requires constant monitoring of vital signs, hemodynamics,respiratory and cardiac monitoring, extensive review of multiple databases, frequent neurological assessment, discussion with family, other specialists and medical decision making of high complexity.I have made any additions or clarifications directly to the above note.This critical care time does not reflect procedure time, or teaching time or supervisory time of PA/NP/Med Resident etc but could involve care discussion time.  I spent 30 minutes of neurocritical care time  in the care of  this patient.  Antony Contras, MD  To contact Stroke Continuity provider, please refer to [email or web address] After hours, contact General Neurology

## 2021-01-24 NOTE — Plan of Care (Signed)
Patient & family need reinforced education.

## 2021-01-24 NOTE — Progress Notes (Signed)
OT Cancellation Note  Patient Details Name: DEMETRES PROCHNOW MRN: [id number] DOB: May 24, 1936   Cancelled Treatment:    Reason Eval/Treat Not Completed: Active bedrest order;Patient not medically ready  [place], OT/L   Acute OT Clinical Specialist Acute Rehabilitation Services Pager [phone] Office [phone]  01/24/2021, 9:43 AM

## 2021-01-24 NOTE — Evaluation (Signed)
Physical Therapy Evaluation Patient Details Name: Brian [name] MRN: [id number] DOB: 1936-08-19 Today's Date: 01/24/2021   History of Present Illness  Pt is a 85 y.o. M who presents with acute onset R sided weakness, facial droop, numbness, aphasia with left gaze preference. CTH showing L thalamic ICH. Significant PMH: afib, DM2, HLD, HTN, stroke.  Clinical Impression  Prior to admission, pt lives alone with intermittent assist from brother (information obtained via chart review). Pt presents with decreased functional mobility secondary to R sided hemiparesis, poor sitting balance, global aphasia, decreased cognition. Pt requiring two person max-total assist for bed mobility. Due to heavy right lateral and posterior lean, pt was unable to stand. BP 160/75 supine, 137/83 sitting, 134/98 supine post mobility. Recommend SNF at discharge to address deficits and maximize functional mobility.     Follow Up Recommendations SNF    Equipment Recommendations  Other (comment) (defer)    Recommendations for Other Services       Precautions / Restrictions Precautions Precautions: Fall;Other (comment) Precaution Comments: R hemiparesis, global aphasia Restrictions Weight Bearing Restrictions: No      Mobility  Bed Mobility Overal bed mobility: Needs Assistance Bed Mobility: Rolling;Sit to Supine;Supine to Sit Rolling: Max assist;+2 for physical assistance   Supine to sit: Total assist;+2 for physical assistance Sit to supine: Total assist;+2 for physical assistance   General bed mobility comments: Max-totalA + 2 to roll towards right side, and sit up on the side of the bed. Heavy assist at trunk    Transfers                 General transfer comment: unable  Ambulation/Gait                Stairs            Wheelchair Mobility    Modified Rankin (Stroke Patients Only) Modified Rankin (Stroke Patients Only) Pre-Morbid Rankin Score: Slight disability Modified  Rankin: Moderately severe disability     Balance Overall balance assessment: Needs assistance Sitting-balance support: Feet supported Sitting balance-Leahy Scale: Poor Sitting balance - Comments: Heavy posterior and right lateral lean, pushing with LUE, requiring maxA. Frequent guidance of L hand into lap to prevent pushing, left lateral leans onto forearm to promote L sided awareness                                     Pertinent Vitals/Pain Pain Assessment: Faces Faces Pain Scale: No hurt    Home Living Family/patient expects to be discharged to:: Private residence Living Arrangements: Alone Available Help at Discharge: Family;Available PRN/intermittently (brother) Type of Home: House Home Access: Stairs to enter   [employer] of Steps: 2 Home Layout: Multi-level Home Equipment: Kasandra Knudsen - single point Additional Comments: Information per chart review in 2019    Prior Function Level of Independence: Independent with assistive device(s)         Comments: Using cane (information per chart review 2019). Pt at car wash when stroke occurred.     Hand Dominance        Extremity/Trunk Assessment   Upper Extremity Assessment Upper Extremity Assessment: Defer to OT evaluation    Lower Extremity Assessment Lower Extremity Assessment: RLE deficits/detail RLE Deficits / Details: No active movement noted, reactive to painful stimulus       Communication   Communication: Receptive difficulties;Expressive difficulties  Cognition Arousal/Alertness: Lethargic Behavior During Therapy: Flat affect Overall Cognitive  Status: Difficult to assess Area of Impairment: Following commands                       Following Commands: Follows one step commands inconsistently       General Comments: Pt following ~25% of one step commands, able to repeat words back inconsistently i.e. "Cheng," or "Saunders Bunkley," but otherwise nonverbal.      General  Comments      Exercises     Assessment/Plan    PT Assessment Patient needs continued PT services  PT Problem List Decreased strength;Decreased range of motion;Decreased activity tolerance;Decreased balance;Decreased mobility;Decreased cognition;Decreased safety awareness       PT Treatment Interventions Functional mobility training;Therapeutic activities;Therapeutic exercise;Balance training;Wheelchair mobility training;Patient/family education;Neuromuscular re-education;Cognitive remediation    PT Goals (Current goals can be found in the Care Plan section)  Acute Rehab PT Goals Patient Stated Goal: unable PT Goal Formulation: Patient unable to participate in goal setting Time For Goal Achievement: 02/07/21 Potential to Achieve Goals: Fair    Frequency Min 3X/week   Barriers to discharge        Co-evaluation PT/OT/SLP Co-Evaluation/Treatment: Yes Reason for Co-Treatment: Complexity of the patient's impairments (multi-system involvement);Necessary to address cognition/behavior during functional activity;For patient/therapist safety;To address functional/ADL transfers PT goals addressed during session: Mobility/safety with mobility;Balance         AM-PAC PT "6 Clicks" Mobility  Outcome Measure Help needed turning from your back to your side while in a flat bed without using bedrails?: Total Help needed moving from lying on your back to sitting on the side of a flat bed without using bedrails?: Total Help needed moving to and from a bed to a chair (including a wheelchair)?: Total Help needed standing up from a chair using your arms (e.g., wheelchair or bedside chair)?: Total Help needed to walk in hospital room?: Total Help needed climbing 3-5 steps with a railing? : Total 6 Click Score: 6    End of Session   Activity Tolerance: Patient limited by lethargy Patient left: in bed;with call bell/phone within reach;with bed alarm set Nurse Communication: Mobility status PT  Visit Diagnosis: Other abnormalities of gait and mobility (R26.89);Hemiplegia and hemiparesis Hemiplegia - Right/Left: Right Hemiplegia - caused by: Nontraumatic intracerebral hemorrhage    Time: 1150-1220 PT Time Calculation (min) (ACUTE ONLY): 30 min   Charges:   PT Evaluation $PT Eval Moderate Complexity: 1 Mod          Wyona Almas, PT, DPT Acute Rehabilitation Services Pager [phone] Office [place] 01/24/2021, 2:00 PM

## 2021-01-24 NOTE — Evaluation (Signed)
Occupational Therapy Evaluation Patient Details Name: Brian [name] MRN: [id number] DOB: 1936/05/16 Today's Date: 01/24/2021    History of Present Illness Pt is a 85 y.o. M who presents with acute onset R sided weakness, facial droop, numbness, aphasia with left gaze preference. CTH showing L thalamic ICH. Significant PMH: afib, DM2, HLD, HTN, stroke.   Clinical Impression   Pt PTA: Pt living alone, independent. Pt currently, totalA +2 for ADL and bed mobility. Pt minguardA to maxA for EOB static sitting. Pt stating "Dominyk" 2 times during session, but kept eyes closed following  <25% of commands. Pt limited by dense R hemiplegia with no AROM on R side noted other than response to painful stimuli. Pt would benefit from continued OT skilled services for ADL, mobility and safety in SNF setting. OT following acutely.    Follow Up Recommendations  SNF;Supervision/Assistance - 24 hour    Equipment Recommendations  None recommended by OT    Recommendations for Other Services       Precautions / Restrictions Precautions Precautions: Fall;Other (comment) Precaution Comments: R hemiparesis, global aphasia Restrictions Weight Bearing Restrictions: No      Mobility Bed Mobility Overal bed mobility: Needs Assistance Bed Mobility: Rolling;Sit to Supine;Supine to Sit Rolling: Max assist;+2 for physical assistance   Supine to sit: Total assist;+2 for physical assistance Sit to supine: Total assist;+2 for physical assistance   General bed mobility comments: Max-totalA + 2 to roll towards right side, and sit up on the side of the bed. Heavy assist at trunk    Transfers                 General transfer comment: unable    Balance Overall balance assessment: Needs assistance Sitting-balance support: Feet supported Sitting balance-Leahy Scale: Poor Sitting balance - Comments: R lateral lean; pushing with LUE requiring maxA to frequently put L hand in lap.                                    ADL either performed or assessed with clinical judgement   ADL Overall ADL's : Needs assistance/impaired                                     Functional mobility during ADLs: Total assistance;+2 for physical assistance;+2 for safety/equipment General ADL Comments: Pt totalA  for ADL at this time.     Vision   Vision Assessment?: Vision impaired- to be further tested in functional context Additional Comments: Pt keeping eyes closed  for session. Per chart, L gaze preference.     Perception     Praxis      Pertinent Vitals/Pain Pain Assessment: Faces Faces Pain Scale: No hurt Pain Intervention(s): Monitored during session     Hand Dominance Right   Extremity/Trunk Assessment Upper Extremity Assessment Upper Extremity Assessment: RUE deficits/detail RUE Deficits / Details: responds to painful stimuli; no AROM noted, increased time for elbow extension RUE Coordination: decreased fine motor;decreased gross motor   Lower Extremity Assessment Lower Extremity Assessment: RLE deficits/detail RLE Deficits / Details: No active movement noted, reactive to painful stimulus   Cervical / Trunk Assessment Cervical / Trunk Assessment: Normal   Communication Communication Communication: Receptive difficulties;Expressive difficulties   Cognition Arousal/Alertness: Lethargic Behavior During Therapy: Flat affect Overall Cognitive Status: Difficult to assess Area of Impairment: Following commands  Following Commands: Follows one step commands inconsistently       General Comments: Pt following ~25% of one step commands, able to repeat words back inconsistently i.e. "Williard," or "Jermie Silverthorn," but otherwise nonverbal. pt keep eyes closed throughout session.   General Comments  160/75 (98) supine  137/83 (98) sitting  134/98 (99) supine post mobility   86-90  > 90% on RA    Exercises     Shoulder Instructions       Home Living Family/patient expects to be discharged to:: Private residence Living Arrangements: Alone Available Help at Discharge: Family;Available PRN/intermittently (brother) Type of Home: House Home Access: Stairs to enter [employer] of Steps: 2   Home Layout: Multi-level               Home Equipment: Kasandra Knudsen - single point   Additional Comments: Information per chart review in 2019      Prior Functioning/Environment Level of Independence: Independent with assistive device(s)        Comments: Using cane (information per chart review 2019). Pt at car wash when stroke occurred.        OT Problem List: Decreased strength;Decreased range of motion;Decreased activity tolerance;Impaired balance (sitting and/or standing);Impaired vision/perception;Decreased coordination;Decreased cognition;Decreased safety awareness;Impaired UE functional use;Increased edema      OT Treatment/Interventions: Self-care/ADL training;Therapeutic exercise;Neuromuscular education;Energy conservation;DME and/or AE instruction;Therapeutic activities;Cognitive remediation/compensation;Patient/family education;Balance training    OT Goals(Current goals can be found in the care plan section) Acute Rehab OT Goals Patient Stated Goal: unable OT Goal Formulation: Patient unable to participate in goal setting Time For Goal Achievement: 02/07/21 Potential to Achieve Goals: Good ADL Goals Pt Will Perform Grooming: with min assist;sitting Pt Will Transfer to Toilet: with max assist;with +2 assist;squat pivot transfer;bedside commode Pt/caregiver will Perform Home Exercise Program: Right Upper extremity;Increased ROM;Increased strength;With minimal assist Additional ADL Goal #1: Pt will attend to task x5 mins with moderate cues to stay alert. Additional ADL Goal #2: Pt will increase to sitting EOB x10 mins with minguardA for static sitting as precursor for ADL.  OT Frequency: Min 2X/week    Barriers to D/C: Decreased caregiver support          Co-evaluation PT/OT/SLP Co-Evaluation/Treatment: Yes Reason for Co-Treatment: Complexity of the patient's impairments (multi-system involvement);To address functional/ADL transfers PT goals addressed during session: Mobility/safety with mobility;Balance OT goals addressed during session: Strengthening/ROM      AM-PAC OT "6 Clicks" Daily Activity     Outcome Measure Help from another person eating meals?: Total Help from another person taking care of personal grooming?: Total Help from another person toileting, which includes using toliet, bedpan, or urinal?: Total Help from another person bathing (including washing, rinsing, drying)?: Total Help from another person to put on and taking off regular upper body clothing?: Total Help from another person to put on and taking off regular lower body clothing?: Total 6 Click Score: 6   End of Session Nurse Communication: Mobility status  Activity Tolerance: Patient limited by fatigue Patient left: in bed;with call bell/phone within reach;with bed alarm set  OT Visit Diagnosis: Unsteadiness on feet (R26.81);Muscle weakness (generalized) (M62.81);Other symptoms and signs involving cognitive function;Hemiplegia and hemiparesis Hemiplegia - Right/Left: Right Hemiplegia - dominant/non-dominant: Dominant Hemiplegia - caused by: Nontraumatic intracerebral hemorrhage                Time: 1155-1220 OT Time Calculation (min): 25 min Charges:  OT General Charges $OT Visit: 1 Visit OT Evaluation $OT Eval Moderate Complexity: 1 Mod  Jefferey Pica, OTR/L Acute Rehabilitation Services Pager: [phone] Office: [phone]   Jefferey Pica 01/24/2021, 5:42 PM

## 2021-01-24 NOTE — Progress Notes (Signed)
  Echocardiogram 2D Echocardiogram has been performed.  Brian [name] G Brian [name] 01/24/2021, 3:08 PM

## 2021-01-24 NOTE — Progress Notes (Signed)
Patient with AFIB, but unable to take PO currently with recent ICH, therefore has not started coreg. Now with afib with RVR. I will start diltiazem drip for rate control for the time being, but can consider transitioning back to coreg once able to take PO.   Roland Rack, MD [facility] [phone]  If 7pm- 7am, please page neurology on call as listed in [place].

## 2021-01-24 NOTE — Progress Notes (Signed)
   01/24/21 2346  Provider Notification  Provider Name/Title Dr Leonel Ramsay  Date Provider Notified 01/24/21  Time Provider Notified 2345  Notification Type Face-to-face  Notification Reason Change in status (SPB over 160, maxed out on cleviprex and both PRNs utilized.)  Provider response See new orders  Date of Provider Response 01/24/21  Time of Provider Response 2345   Orders for labetalol 10 mg prn 10 minutes

## 2021-01-25 ENCOUNTER — Inpatient Hospital Stay (HOSPITAL_COMMUNITY): Payer: Medicare HMO

## 2021-01-25 DIAGNOSIS — I610 Nontraumatic intracerebral hemorrhage in hemisphere, subcortical: Secondary | ICD-10-CM | POA: Diagnosis not present

## 2021-01-25 LAB — GLUCOSE, CAPILLARY
Glucose-Capillary: 146 mg/dL — ABNORMAL HIGH (ref 70–99)
Glucose-Capillary: 183 mg/dL — ABNORMAL HIGH (ref 70–99)
Glucose-Capillary: 193 mg/dL — ABNORMAL HIGH (ref 70–99)
Glucose-Capillary: 199 mg/dL — ABNORMAL HIGH (ref 70–99)
Glucose-Capillary: 259 mg/dL — ABNORMAL HIGH (ref 70–99)
Glucose-Capillary: 266 mg/dL — ABNORMAL HIGH (ref 70–99)

## 2021-01-25 LAB — LIPID PANEL
Cholesterol: 147 mg/dL (ref 0–200)
HDL: 53 mg/dL (ref >40)
LDL Cholesterol: 69 mg/dL (ref 0–99)
Total CHOL/HDL Ratio: 2.8 RATIO
Triglycerides: 125 mg/dL (ref <150)
VLDL: 25 mg/dL (ref 0–40)

## 2021-01-25 LAB — MAGNESIUM: Magnesium: 1.7 mg/dL (ref 1.7–2.4)

## 2021-01-25 LAB — PHOSPHORUS: Phosphorus: 3.2 mg/dL (ref 2.5–4.6)

## 2021-01-25 MED ORDER — JEVITY 1.5 CAL/FIBER PO LIQD
1000.0000 mL | ORAL | Status: DC
  Administered 2021-01-25 – 2021-02-14 (×17): 1000 mL
  Filled 2021-01-25 (×34): qty 1000

## 2021-01-25 MED ORDER — SENNOSIDES-DOCUSATE SODIUM 8.6-50 MG PO TABS
1.0000 | ORAL_TABLET | Freq: Two times a day (BID) | ORAL | Status: DC
  Administered 2021-01-25 – 2021-02-28 (×61): 1
  Filled 2021-01-25 (×63): qty 1

## 2021-01-25 MED ORDER — SODIUM CHLORIDE 0.9 % IV BOLUS
500.0000 mL | Freq: Once | INTRAVENOUS | Status: AC
  Administered 2021-01-25: 500 mL via INTRAVENOUS

## 2021-01-25 MED ORDER — SODIUM CHLORIDE 0.9 % IV SOLN: INTRAVENOUS | Status: AC

## 2021-01-25 MED ORDER — GABAPENTIN 250 MG/5ML PO SOLN
100.0000 mg | Freq: Three times a day (TID) | ORAL | Status: DC
  Filled 2021-01-25 (×3): qty 2

## 2021-01-25 MED ORDER — GABAPENTIN 250 MG/5ML PO SOLN
100.0000 mg | Freq: Three times a day (TID) | ORAL | Status: DC
  Administered 2021-01-25 – 2021-02-28 (×101): 100 mg
  Filled 2021-01-25 (×109): qty 2

## 2021-01-25 MED ORDER — PROSOURCE TF PO LIQD
45.0000 mL | Freq: Three times a day (TID) | ORAL | Status: DC
  Administered 2021-01-25 – 2021-02-14 (×61): 45 mL
  Filled 2021-01-25 (×61): qty 45

## 2021-01-25 NOTE — Progress Notes (Signed)
STROKE TEAM PROGRESS NOTE   INTERVAL HISTORY No acute events.  Improved attention, trying to talk today. Some 3 word phrases are comprehensible with obvious word finding difficulty.  Lives by himself. Adult children are out of town. Coming in later this weekend.  Still requring cleviprex for adequate blood pressure control.. ST contacted for swallow eval. Will start home BP meds today if able.  Neurological exam is unchanged  Vitals:   01/25/21 0630 01/25/21 0645 01/25/21 0700 01/25/21 0800  BP: (!) 162/77 (!) 162/70 (!) 159/80 (!) 154/74  Pulse: 95 92 95 79  Resp: (!) 22 (!) 21 (!) 21 (!) 22  Temp:    98.3 F (36.8 C)  TempSrc:    Axillary  SpO2: 95% 91% 97% 95%  Weight:      Height:       CBC:  Recent Labs  Lab 01/23/21 1537 01/23/21 1543  WBC 4.5  --   NEUTROABS 2.7  --   HGB 11.6* 12.2*  HCT 38.4* 36.0*  MCV 88.9  --   PLT 185  --    Basic Metabolic Panel:  Recent Labs  Lab 01/23/21 1537 01/23/21 1543  NA 139 140  K 4.7 4.7  CL 104 103  CO2 27  --   GLUCOSE 151* 146*  BUN 16 20  CREATININE 1.22 1.20  CALCIUM 9.4  --    Recent Labs  Lab 01/23/21 1542  HGBA1C 5.8*   Lipid panel is pending  IMAGING and DIAGNOSTICS Repeat CT head 5/11 No significant interval change in size and morphology of left thalamic hemorrhage. Surrounding edema has mildly increased from prior, although regional mass effect and localized left-to-right shift is not significantly changed. 2. Intraventricular extension with blood within the lateral and third ventricles, stable. Stable ventricular size and morphology without hydrocephalus or trapping. 3. No other new acute intracranial abnormality.  CT head: repeat on 5/9 Stable appearing left thalamic hemorrhage with intraventricular component. No new focal abnormality is noted.  MRI Brain 1. Motion degraded exam. 2. 3.0 x 3.1 x 3.4 cm acute intraparenchymal hemorrhage centered at the left thalamus. Surrounding vasogenic edema  with mild regional mass effect and 5 mm of left-to-right shift. No visible underlying mass lesion or other structure abnormality on this motion degraded exam. 3. Associated intraventricular extension with blood within the left greater than right lateral ventricles. No hydrocephalus or ventricular trapping at this time. 4. Underlying age-related cerebral atrophy with mild chronic small vessel ischemic disease, with a few small remote right cerebellar infarcts.  MRA HEAD 1. Technically limited exam due to extensive motion artifact. 2. Grossly negative intracranial MRA. No large vessel occlusion, hemodynamically significant stenosis, or other acute vascular abnormality. No vascular abnormality seen underlying the acute left thalamic hemorrhage. Please note that while a MRA neck was also ordered for this exam, this was unable to be performed due to the patient's inability to tolerate the full length of the study and extensive motion artifact  CT Head Code Stroke 5/9 Acute left thalamic hemorrhage with mild regional mass effect and intraventricular extension. No hydrocephalus. Chronic microvascular ischemic changes. Small chronic right cerebellar infarct.  PHYSICAL EXAM  General:  Mildly obese elderly African-American male lying in bed, somewhat restless, resists exam.  HENT: Normal oropharynx and mucosa. Normal external appearance of ears and nose. Neck: Supple, no pain or tenderness CV: No JVD. No peripheral edema. Pulmonary: Symmetric Chest rise. Normal respiratory effort. Abdomen: Soft to touch, non-tender. Ext: No cyanosis, edema, or deformity Skin: No  rash. Normal palpation of skin.  Musculoskeletal: Normal digits and nails by inspection. No clubbing.  Neurologic Examination  Mental status/Cognition: Keeps eyes closed, Inattentive to examiner. Restless. Does not follow commands.  Speech/language: No verbal response to verbal or tactile stimuli  Cranial nerves:   CN II  Pupils equal and reactive to light, ? Partial R hemianopsia.   CN III,IV,VI L gaze preference, does cross midline.   CN V No response to scratch on the R face.   CN VII R facial droop   CN VIII normal hearing to speech turns towards speech.   CN IX & X    CN XI    CN XII midline tongue protrusion   Motor:  Muscle bulk: normal, tone normal Unable to do full strength testing secondary to aphasia. Moves LUE and LLE spontaneously. Minimal movement in RUE, does withdraw RLE when eliciting planter reflex.  Only trace withdrawal in the right upper extremity.  Tone is increased on the right side.  Gait not tested.  ASSESSMENT/PLAN Brian [name] is a 85 y.o. male presenting with L Thalamic ICH with some IVH and an ICH score of 2. Unable to provide any history secondary to the stroke but given Eliquis listed on home medications, suspect the ICH is secondary to Eliquis. He is s/p Andexanet alpha for presumed ICH on Eliquis.  ICH with associated cerebral edema in the setting hypertensive emergency and apixaban therapy for paroxysmal atrial fibrillation.   Plan for repeat HCT 5/11 pending  Code Stroke: Acute left thalamic hemorrhage with regional mass effect and IVH  MRI head: 3.0 x 3.1 x 3.4 cm acute intraparenchymal hemorrhage centered at the left thalamus. Surrounding vasogenic edema with mild regional mass effect and 5 mm of left-to-right shift.  MRA head: Grossly negative, No LVO  MRA neck: unable to complete due to extensive motion  2D Echo: EF 55-60%, +Mildly dilated left atrial size, Grade 1 diastolic dysfunction. No thrombus, wall motion abnormality or shunt found.   Repeat CT Head 5/1: stable IPH, edema and mass effect.   VTE prophylaxis - PPX  On Eliquis for known Aflutter prior to admission. On hold due to hemorrhage. No anticoagulant or antiplatelets for now.   Transfer to floor when able to transition away from IV medications for hypertension with stable BP  Therapy  recommendations:  SNF  Disposition:  TBD  Atrial fibrillation  Home medication: Eliquis   Followed by Dr. Harrington Challenger  2019 Echo showed EF 45-50%  now with improved EF 55-60%   Hypertension  Home meds: Norvasc 10, Coreg 12.5, Cozaar 100mg    Unstable, hypertensive emergency on arrival. Requiring cleviprex infusion. Weaning attempt discussed with RN to prn IV medications.  Unable to transition to po meds as he failed his swallow eval awaiting tube placement.   BP goal less than 160.  Hyperlipidemia  Home meds:  Lipitor 80mg   LDL at 55, goal < 70  High intensity statin: at goal with home regimen. Hold statin in setting of hemorrhage.   Consider continuing statin at discharge  DM2, controlled  Home meds are glargine 14 u hs, metformin 1000 BID, Januvia 100 daily  HgbA1c 5.8, goal < 7.0  CBGs Recent Labs    01/24/21 0345 01/24/21 0816 01/24/21 1200  GLUCAP 151* 130* 127*      SSI  NPO  Dysphagia NPO  Failed swallow eval 5/11, NPO rec  Cortrack tube placement pending  RD consulted for Tube feeding recs  Other Stroke Risk Factors  Advanced  Age >/= 71   Former Cigarette smoker quit 1995.   Current ETOH use, advised to drink no more than 2       drink(s) a day  Overweight, Body mass index is 29.24 kg/m., BMI >/= 30 associated with increased stroke risk, recommend weight loss, diet and exercise as appropriate   Hx stroke/TIA  Other Active Problems   I have personally obtained history,examined this patient, reviewed notes, independently viewed imaging studies, participated in medical decision making and plan of care.ROS completed by me personally and pertinent positives fully documented  I have made any additions or clarifications directly to the above note. Agree with note above..  Continue strict blood pressure control with systolic goal below 694.  Wean Cleviprex drip and use as needed IV hydralazine and labetalol and oral blood pressure medication after  core track tube placement.  Mobilize out of bed.  Therapy consults.  Long discussion with sister at the bedside and answered questions.This patient is critically ill and at significant risk of neurological worsening, death and care requires constant monitoring of vital signs, hemodynamics,respiratory and cardiac monitoring, extensive review of multiple databases, frequent neurological assessment, discussion with family, other specialists and medical decision making of high complexity.I have made any additions or clarifications directly to the above note.This critical care time does not reflect procedure time, or teaching time or supervisory time of PA/NP/Med Resident etc but could involve care discussion time.  I spent 30 minutes of neurocritical care time  in the care of  this patient.      Antony Contras, MD Medical Director [place] Pager: [phone] 01/25/2021 3:17 PM  To contact Stroke Continuity provider, please refer to [email or web address] After hours, contact General Neurology

## 2021-01-25 NOTE — Progress Notes (Signed)
Speech-language-Cognitive evaluation     01/25/21 1059  SLP Visit Information  SLP Received On 01/25/21  SLP Time Calculation  SLP Start Time (ACUTE ONLY) 1046  SLP Stop Time (ACUTE ONLY) 1052  SLP Time Calculation (min) (ACUTE ONLY) 6 min  General Information  HPI Pt is a 85 y.o. M who presents with acute onset R sided weakness, facial droop, numbness, aphasia with left gaze preference. CTH showing L thalamic ICH. Significant PMH: afib, DM2, HLD, HTN, stroke  Prior Functional Status  Cognitive/Linguistic Baseline Information not available  Pain Assessment  Pain Assessment Faces  Faces Pain Scale 2  Pain Location  (eyes burning?)  Pain Intervention(s) Monitored during session  Oral Motor/Sensory Function  Overall Oral Motor/Sensory Function Mild impairment  Facial Symmetry Abnormal symmetry right;Suspected CN VII (facial) dysfunction  Cognition  Overall Cognitive Status Impaired/Different from baseline  Arousal/Alertness  (awake but drowsy)  Orientation Level  (no response to y/n questions)  Attention Sustained  Sustained Attention Impaired  Sustained Attention Impairment Functional basic  Memory  (TBA)  Awareness Impaired  Awareness Impairment Emergent impairment  Problem Solving Impaired  Problem Solving Impairment Functional basic  Safety/Judgment Impaired  Auditory Comprehension  Overall Auditory Comprehension Impaired  Yes/No Questions  (no response to y/n)  Commands X  One Step Basic Commands 0-24% accurate (0%)  Interfering Components Attention  Visual Recognition/Discrimination  Discrimination Not tested  Reading Comprehension  Reading Status X  Word level Impaired  Sentence Level Impaired  Expression  Primary Mode of Expression  (mostly non verbal)  Verbal Expression  Overall Verbal Expression Impaired  Initiation Impaired  Automatic Speech  (no response)  Level of Generative/Spontaneous Verbalization  (spontaneous word x 2)  Repetition  Impaired  Level of Impairment  (phoneme)  Naming Impairment  Confrontation Impaired  Convergent Not tested  Divergent Not tested  Pragmatics Impairment  Impairments Eye contact;Abnormal affect  Interfering Components Attention  Written Expression  Dominant Hand Right  Written Expression Not tested  Motor Speech  Overall Motor Speech  (need to assess further)  Intelligibility Intelligible (for one word)  Motor Planning  (will further assess)  SLP - End of Session  Patient left in bed;with bed alarm set  Nurse Communication Treatment plan  Assessment  Clinical Impression Statement (ACUTE ONLY) Pt presents with global aphasia, suspected verbal apraxia, right facial asymmetry and cognitive impairments. He verbalized 1-2 times "yeah" and unable to phonate to imitate or in unison for familiar speech tasks. Focused attention to therapist when asked, otherwise looked downward or was distracted by watery/suspected burning eyes. Reuired assist to use common objects for feeding. No response to yes/no questions or commands. Continued treatment recommended in acute and post acute for communication-cognition.  SLP Recommendation/Assessment Patient needs continued Speech Lanaguage Pathology Services  SLP Visit Diagnosis Aphasia (R47.01);Cognitive communication deficit (R41.841)  Problem List Auditory comprehension;Verbal expression;Attention  Plan  Speech Therapy Frequency (ACUTE ONLY) min 2x/week  Duration 2 weeks  Treatment/Interventions Language facilitation;Environmental controls;Cueing hierarchy;SLP instruction and feedback;Compensatory strategies;Patient/family education;Multimodal communcation approach;Functional tasks  Potential to Achieve Goals (ACUTE ONLY)  (fair-good)  SLP Recommendations  Follow up Recommendations Skilled Nursing facility  SLP Equipment None recommended by SLP  Individuals Consulted  Consulted and Agree with Results and Recommendations Patient unable/family or  caregive not available  SLP Evaluations  $ SLP Speech Visit 1 Visit  SLP Evaluations  $ SLP EVAL LANGUAGE/SOUND PRODUCTION 1 Procedure  Orbie Pyo Val Farnam M.Ed Risk analyst [phone] Office [phone]

## 2021-01-25 NOTE — Progress Notes (Signed)
Alerted Stroke team that the patient had low urine out put,163mLs, during shift.  Bladder scan showed 56mLs.  Was given orders for a 500 mL bolus of normal saline and MIVF of 46ml/hr NS.  Will administer and continue to monitor and act accordingly.

## 2021-01-25 NOTE — Progress Notes (Signed)
Transitions of Care Team following this patient for discharge planning and SNF recommendation. Currently, patient not medically stable; will continue to follow as patient progresses.    Fahd Galea LCSW

## 2021-01-25 NOTE — Procedures (Signed)
Cortrak  Person Inserting Tube:  Euriah Matlack, RD Tube Type:  Cortrak - 43 inches Tube Location:  Right nare Initial Placement:  Stomach Secured by: Bridle Technique Used to Measure Tube Placement:  Documented cm marking at nare/ corner of mouth Cortrak Secured At:  75 cm    No x-Adelbert is required. RN may begin using tube.   If the tube becomes dislodged please keep the tube and contact the Cortrak team at [email or web address] (password [email or web address]) for replacement.  If after hours and replacement cannot be delayed, place a NG tube and confirm placement with an abdominal x-Marris.    Ari Engelbrecht RD, LDN Clinical Nutrition Pager listed in AMION   

## 2021-01-25 NOTE — Evaluation (Signed)
Clinical/Bedside Swallow Evaluation Patient Details  Name: Brian [name] MRN: [id number] Date of Birth: 02-Sep-1936  Today's Date: 01/25/2021 Time: SLP Start Time (ACUTE ONLY): 97 SLP Stop Time (ACUTE ONLY): 1052 SLP Time Calculation (min) (ACUTE ONLY): 9 min  Past Medical History:  Past Medical History:  Diagnosis Date  . ALLERGIC RHINITIS 10/13/2007  . ANEMIA, CHRONIC DISEASE NEC 03/31/2007  . BENIGN PROSTATIC HYPERTROPHY 03/31/2007  . Chest pain    Stress echo, normal, December, 2012  . Chronic anemia   . DIABETES MELLITUS, TYPE II 03/31/2007  . [place] DISEASE, LUMBAR 03/31/2007  . ED (erectile dysfunction)   . Ejection fraction    EF  normal, stress echo, December,  2012  . History of prostatitis   . HYPERLIPIDEMIA 03/31/2007  . HYPERTENSION 03/31/2007  . Nephrolithiasis 12/07/2010  . Obesity   . POLYP, ANAL AND RECTAL 03/31/2007  . Right eye trauma    hx as child  . Stroke [facility])    Past Surgical History:  Past Surgical History:  Procedure Laterality Date  . [place] SURGERY     1999  . PROSTATE BIOPSY     s/p  . RECTAL POLYPECTOMY     Transanal excision 10/2005  . SKIN BIOPSY     s/p right upper back 2009- benign Dr. Tonia Brooms   HPI:  Pt is a 85 y.o. M who presents with acute onset R sided weakness, facial droop, numbness, aphasia with left gaze preference. CTH showing L thalamic ICH. Significant PMH: afib, DM2, HLD, HTN, stroke   Assessment / Plan / Recommendation Clinical Impression  Mr. Macmurray was awake, prefers to keep eyes closed or in downward gaze and distracted by watery/burning eyes. He is globally aphasic and was unable to perform labial/lingual tasks on command. At rest, evidence of CN VII insult with droop noted, decreased tone and residue with applesauce. First trial of evaluation was applesauce followed by an immediate cough. No cough with large sip size thin with labial leakage. Given pt's suspected apraxia, current alertness/awareness level recommend to  defer further instrumental swallow assessment presently. Cortak has been ordered and ST service will work with pt moving toward po's. SLP Visit Diagnosis: Dysphagia, unspecified (R13.10)    Aspiration Risk  Moderate aspiration risk    Diet Recommendation NPO   Medication Administration: Via alternative means    Other  Recommendations Oral Care Recommendations: Oral care QID   Follow up Recommendations Inpatient Rehab      Frequency and Duration min 2x/week  2 weeks       Prognosis Prognosis for Safe Diet Advancement:  (fair-good)      Swallow Study   General HPI: Pt is a 85 y.o. M who presents with acute onset R sided weakness, facial droop, numbness, aphasia with left gaze preference. CTH showing L thalamic ICH. Significant PMH: afib, DM2, HLD, HTN, stroke Type of Study: Bedside Swallow Evaluation Previous Swallow Assessment:  (none; SLE only) Diet Prior to this Study: NPO Temperature Spikes Noted: No Respiratory Status: Nasal cannula History of Recent Intubation: No Behavior/Cognition: Requires cueing;Doesn't follow directions;Other (Comment) (awake) Oral Cavity Assessment:  (not fully viewed) Oral Care Completed by SLP: Yes Oral Cavity - Dentition: Dentures, top;Dentures, bottom Vision: Impaired for self-feeding Self-Feeding Abilities: Needs assist Patient Positioning: Upright in bed Baseline Vocal Quality: Other (comment) (clear for one utterance) Volitional Cough:  (unable to elicit) Volitional Swallow: Unable to elicit    Oral/Motor/Sensory Function Overall Oral Motor/Sensory Function: Mild impairment Facial Symmetry: Abnormal symmetry right;Suspected CN VII (  facial) dysfunction Lingual ROM:  (not follow commands to protrude)   Ice Chips Ice chips: Not tested   Thin Liquid Thin Liquid: Impaired Presentation: Cup;Spoon Oral Phase Impairments: Reduced labial seal Oral Phase Functional Implications: Left anterior spillage Pharyngeal  Phase Impairments: Suspected  delayed Swallow    Nectar Thick Nectar Thick Liquid: Not tested   Honey Thick Honey Thick Liquid: Not tested   Puree Puree: Impaired Oral Phase Impairments: Other (comment) (dec sensation) Oral Phase Functional Implications: Other (comment) (right labial residue) Pharyngeal Phase Impairments: Cough - Immediate   Solid     Solid: Not tested      Houston Siren 01/25/2021,11:23 AM  Orbie Pyo Colvin Caroli.Ed Risk analyst [phone] Office [phone]

## 2021-01-25 NOTE — Progress Notes (Signed)
Initial Nutrition Assessment  DOCUMENTATION CODES:   Not applicable  INTERVENTION:   Initiate tube feeding via Cortrak tube: Jevity 1.5 at 60 ml/h (1440 ml per day) Prosource TF 45 ml TID  Provides 2280 kcal, 123 gm protein, 1094 ml free water daily   NUTRITION DIAGNOSIS:   Inadequate oral intake related to inability to eat as evidenced by NPO status.  GOAL:   Patient will meet greater than or equal to 90% of their needs  MONITOR:   TF tolerance,Diet advancement  REASON FOR ASSESSMENT:   Consult Enteral/tube feeding initiation and management  ASSESSMENT:   Pt with PMH of Afib, DM, HLD, HTN, obesity, prior L MCA now admitted with ICH and associated cerebral edema in setting of hypertensive emergency and Apixaban therapy for Afib.   Pt discussed during ICU rounds and with RN.  Pt failed swallow eval, per SLP pt globally aphasic.  Pt unable to answer questions, pt lives alone, family lives out of town. Therapy recommends SNF placement.   5/11 cortak placed, tip gastric   Medications reviewed and include: SSI, protonix, senokot-s Labs reviewed:  CBG's: 146-199  NUTRITION - FOCUSED PHYSICAL EXAM:  Flowsheet Row Most Recent Value  Orbital Region No depletion  Upper Arm Region No depletion  Thoracic and Lumbar Region No depletion  Buccal Region No depletion  Temple Region No depletion  Clavicle Bone Region No depletion  Clavicle and Acromion Bone Region No depletion  Scapular Bone Region Unable to assess  Dorsal Hand Unable to assess  Patellar Region No depletion  Anterior Thigh Region No depletion  Posterior Calf Region No depletion  Edema (RD Assessment) None  Hair Reviewed  Eyes Reviewed  Skin Unable to assess  Nails Unable to assess       Diet Order:   Diet Order            Diet NPO time specified  Diet effective now                 EDUCATION NEEDS:   No education needs have been identified at this time  Skin:  Skin Assessment: Reviewed  RN Assessment  Last BM:  unknown  Height:   Ht Readings from Last 1 Encounters:  01/23/21 6' (1.829 m)    Weight:   Wt Readings from Last 1 Encounters:  01/23/21 97.8 kg    Ideal Body Weight:  80.9 kg  BMI:  Body mass index is 29.24 kg/m.  Estimated Nutritional Needs:   Kcal:  2200-2400  Protein:  115-130 grams  Fluid:  > 2 L/day  Lockie Pares., RD, LDN, CNSC See AMiON for contact information

## 2021-01-26 ENCOUNTER — Inpatient Hospital Stay (HOSPITAL_COMMUNITY): Payer: Medicare HMO

## 2021-01-26 DIAGNOSIS — I610 Nontraumatic intracerebral hemorrhage in hemisphere, subcortical: Secondary | ICD-10-CM | POA: Diagnosis not present

## 2021-01-26 LAB — URINALYSIS, ROUTINE W REFLEX MICROSCOPIC
Bilirubin Urine: NEGATIVE
Glucose, UA: 150 mg/dL — AB
Hgb urine dipstick: NEGATIVE
Ketones, ur: NEGATIVE mg/dL
Leukocytes,Ua: NEGATIVE
Nitrite: NEGATIVE
Protein, ur: 30 mg/dL — AB
Specific Gravity, Urine: 1.026 (ref 1.005–1.030)
pH: 5 (ref 5.0–8.0)

## 2021-01-26 LAB — BASIC METABOLIC PANEL
Anion gap: 9 (ref 5–15)
BUN: 30 mg/dL — ABNORMAL HIGH (ref 8–23)
CO2: 23 mmol/L (ref 22–32)
Calcium: 9 mg/dL (ref 8.9–10.3)
Chloride: 102 mmol/L (ref 98–111)
Creatinine, Ser: 1.39 mg/dL — ABNORMAL HIGH (ref 0.61–1.24)
GFR, Estimated: 50 mL/min — ABNORMAL LOW (ref >60)
Glucose, Bld: 332 mg/dL — ABNORMAL HIGH (ref 70–99)
Potassium: 4 mmol/L (ref 3.5–5.1)
Sodium: 134 mmol/L — ABNORMAL LOW (ref 135–145)

## 2021-01-26 LAB — GLUCOSE, CAPILLARY
Glucose-Capillary: 319 mg/dL — ABNORMAL HIGH (ref 70–99)
Glucose-Capillary: 317 mg/dL — ABNORMAL HIGH (ref 70–99)
Glucose-Capillary: 221 mg/dL — ABNORMAL HIGH (ref 70–99)
Glucose-Capillary: 272 mg/dL — ABNORMAL HIGH (ref 70–99)
Glucose-Capillary: 208 mg/dL — ABNORMAL HIGH (ref 70–99)

## 2021-01-26 LAB — CBC
HCT: 38.5 % — ABNORMAL LOW (ref 39.0–52.0)
Hemoglobin: 11.8 g/dL — ABNORMAL LOW (ref 13.0–17.0)
MCH: 25.9 pg — ABNORMAL LOW (ref 26.0–34.0)
MCHC: 30.6 g/dL (ref 30.0–36.0)
MCV: 84.6 fL (ref 80.0–100.0)
Platelets: 178 10*3/uL (ref 150–400)
RBC: 4.55 MIL/uL (ref 4.22–5.81)
RDW: 13.9 % (ref 11.5–15.5)
WBC: 13 10*3/uL — ABNORMAL HIGH (ref 4.0–10.5)
nRBC: 0.2 % (ref 0.0–0.2)

## 2021-01-26 LAB — PHOSPHORUS
Phosphorus: 2.3 mg/dL — ABNORMAL LOW (ref 2.5–4.6)
Phosphorus: 3.2 mg/dL (ref 2.5–4.6)

## 2021-01-26 LAB — MAGNESIUM
Magnesium: 1.8 mg/dL (ref 1.7–2.4)
Magnesium: 1.8 mg/dL (ref 1.7–2.4)

## 2021-01-26 MED ORDER — AMLODIPINE BESYLATE 10 MG PO TABS
10.0000 mg | ORAL_TABLET | Freq: Every day | ORAL | Status: DC
  Administered 2021-01-26 – 2021-02-28 (×33): 10 mg
  Filled 2021-01-26 (×34): qty 1

## 2021-01-26 MED ORDER — LOSARTAN POTASSIUM 50 MG PO TABS
50.0000 mg | ORAL_TABLET | Freq: Every day | ORAL | Status: DC
  Administered 2021-01-26 – 2021-01-28 (×3): 50 mg
  Filled 2021-01-26 (×3): qty 1

## 2021-01-26 MED ORDER — ORAL CARE MOUTH RINSE
15.0000 mL | Freq: Two times a day (BID) | OROMUCOSAL | Status: DC
  Administered 2021-01-26 – 2021-02-23 (×54): 15 mL via OROMUCOSAL

## 2021-01-26 MED ORDER — SODIUM CHLORIDE 0.9 % IV BOLUS
500.0000 mL | Freq: Once | INTRAVENOUS | Status: AC
  Administered 2021-01-26: 500 mL via INTRAVENOUS

## 2021-01-26 MED ORDER — SODIUM CHLORIDE 0.9 % IV SOLN: INTRAVENOUS | Status: DC

## 2021-01-26 MED ORDER — CHLORHEXIDINE GLUCONATE 0.12 % MT SOLN
15.0000 mL | Freq: Two times a day (BID) | OROMUCOSAL | Status: DC
  Administered 2021-01-26 – 2021-02-28 (×65): 15 mL via OROMUCOSAL
  Filled 2021-01-26 (×54): qty 15

## 2021-01-26 MED ORDER — INSULIN ASPART 100 UNIT/ML IJ SOLN
3.0000 [IU] | INTRAMUSCULAR | Status: DC
  Administered 2021-01-26 – 2021-01-27 (×6): 3 [IU] via SUBCUTANEOUS

## 2021-01-26 MED ORDER — INSULIN ASPART 100 UNIT/ML IJ SOLN
0.0000 [IU] | INTRAMUSCULAR | Status: DC
  Administered 2021-01-26: 7 [IU] via SUBCUTANEOUS
  Administered 2021-01-26: 11 [IU] via SUBCUTANEOUS
  Administered 2021-01-26: 7 [IU] via SUBCUTANEOUS
  Administered 2021-01-27: 3 [IU] via SUBCUTANEOUS
  Administered 2021-01-27: 7 [IU] via SUBCUTANEOUS
  Administered 2021-01-27: 4 [IU] via SUBCUTANEOUS
  Administered 2021-01-27: 11 [IU] via SUBCUTANEOUS
  Administered 2021-01-27 (×2): 4 [IU] via SUBCUTANEOUS
  Administered 2021-01-27: 7 [IU] via SUBCUTANEOUS
  Administered 2021-01-28 (×2): 4 [IU] via SUBCUTANEOUS
  Administered 2021-01-28: 7 [IU] via SUBCUTANEOUS
  Administered 2021-01-28 (×2): 4 [IU] via SUBCUTANEOUS
  Administered 2021-01-29 (×2): 7 [IU] via SUBCUTANEOUS
  Administered 2021-01-29: 4 [IU] via SUBCUTANEOUS

## 2021-01-26 NOTE — Progress Notes (Signed)
Inpatient Diabetes Program Recommendations  AACE/ADA: New Consensus Statement on Inpatient Glycemic Control (2015)  Target Ranges:  Prepandial:   less than 140 mg/dL      Peak postprandial:   less than 180 mg/dL (1-2 hours)      Critically ill patients:  140 - 180 mg/dL   Lab Results  Component Value Date   GLUCAP 317 (H) 01/26/2021   HGBA1C 5.8 (H) 01/23/2021    Review of Glycemic Control Results for Brian [name], Brian [name] (MRN [id number]) as of 01/26/2021 10:19  Ref. Range 01/25/2021 15:48 01/25/2021 19:41 01/25/2021 23:31 01/26/2021 03:49 01/26/2021 07:51  Glucose-Capillary Latest Ref Range: 70 - 99 mg/dL 183 (H) 266 (H) 259 (H) 319 (H) 317 (H)   Diabetes history: DM 2 Outpatient Diabetes medications: Basaglar 20 units qhs, Metformin 1000 mg bid, Januvia 100 mg Daily Current orders for Inpatient glycemic control:  Novolog 0-15 units Q4 hours  Jevity 60 ml/hour  Inpatient Diabetes Program Recommendations:    - Add Lantus 15 units  May need addition of Tube feed coverage Q4 if still elevated.  Thanks,  Tama Headings RN, MSN, BC-ADM Inpatient Diabetes Coordinator Team Pager [phone] (8a-5p)

## 2021-01-26 NOTE — Progress Notes (Signed)
   01/26/21 1954  Vitals  Pulse Rate (!) 166  ECG Heart Rate (!) 168  Resp 18  Oxygen Therapy  SpO2 97 %  MEWS Score  MEWS Temp 0  MEWS Systolic 0  MEWS Pulse 3  MEWS RR 0  MEWS LOC 1  MEWS Score 4  MEWS Score Color Red  Provider Notification  Provider Name/Title Dr. Leonel Ramsay  Date Provider Notified 01/26/21  Time Provider Notified 1955  Notification Type Page  Notification Reason Change in status (Patient's HR 160s-170s, possible SVT not-sustained.)  Provider response See new orders (500 NS fluid bolus)  Date of Provider Response 01/26/21  Time of Provider Response 1958    This RN attempted to capture EKG, patient flipped back into ST on his own.

## 2021-01-26 NOTE — Progress Notes (Signed)
Physical Therapy Treatment Patient Details Name: Brian [name] MRN: [id number] DOB: Jan 27, 1936 Today's Date: 01/26/2021    History of Present Illness Pt is a 85 y.o. M who presents with acute onset R sided weakness, facial droop, numbness, aphasia with left gaze preference. CTH showing L thalamic ICH. Significant PMH: afib, DM2, HLD, HTN, stroke.    PT Comments    Pt with increased lethargy today, able to maintain arousal/attention for short bouts (~1 min) with initial change in position such as reclined to supine, sitting EOB, and lateral leaning in sitting. The pt continues to require max-totalA of 2 to complete all bed mobility and repositioning in bed. The pt initially presented with posterior and R lateral lean in sitting, but improved to minA through session after ~8 min sitting EOB. The pt will continue to benefit from acute therapy as well as continued therapies at SNF following d/c to maximize pt engagement, return of function and strength to decrease caregiver burden.     Follow Up Recommendations  SNF     Equipment Recommendations  Other (comment) (defer to post acute)    Recommendations for Other Services       Precautions / Restrictions Precautions Precautions: Fall;Other (comment) Precaution Comments: R hemiparesis, global aphasia, cortrak Restrictions Weight Bearing Restrictions: No    Mobility  Bed Mobility Overal bed mobility: Needs Assistance Bed Mobility: Rolling;Sit to Supine;Supine to Sit Rolling: Total assist;+2 for physical assistance   Supine to sit: Total assist;+2 for physical assistance Sit to supine: Total assist;+2 for physical assistance   General bed mobility comments: Max-totalA + 2 to roll towards right side, and sit up on the side of the bed. Heavy assist at trunk    Transfers                 General transfer comment: unable   Modified Rankin (Stroke Patients Only) Modified Rankin (Stroke Patients Only) Pre-Morbid Rankin Score:  Slight disability Modified Rankin: Severe disability     Balance Overall balance assessment: Needs assistance Sitting-balance support: Feet supported Sitting balance-Leahy Scale: Poor Sitting balance - Comments: initially with strong R and posterior lean, progressed from maxA to minA to maintain positioning. frequent repositioning of LUE to reduce pushing. tolerated lateral lean to L x5 with guidance to position LUE. pt able to push back to sitting witout assist Postural control: Posterior lean;Right lateral lean                                  Cognition Arousal/Alertness: Lethargic Behavior During Therapy: Flat affect Overall Cognitive Status: Impaired/Different from baseline Area of Impairment: Following commands                               General Comments: Pt with increased lethargy, difficult to arouse and maintain attention even with change in positioning to sitting upright at EOB. pt following <10% of cues given this session. withdrew to noxious stimuli on LUE, LLE, and RLE, not RUE.      Exercises General Exercises - Lower Extremity Ankle Circles/Pumps: PROM;Both;10 reps Heel Slides: PROM;Both;10 reps;Supine    General Comments General comments (skin integrity, edema, etc.): BP stable with changes in position. 115/73 sitting in chair position at end of session. SpO2 in 90s on RA      Pertinent Vitals/Pain Pain Assessment: No/denies pain Pain Intervention(s): Monitored during session  PT Goals (current goals can now be found in the care plan section) Acute Rehab PT Goals Patient Stated Goal: none stated PT Goal Formulation: Patient unable to participate in goal setting Time For Goal Achievement: 02/07/21 Potential to Achieve Goals: Fair Progress towards PT goals: Not progressing toward goals - comment (continued lethargy)    Frequency    Min 3X/week      PT Plan Current plan remains appropriate       AM-PAC PT "6  Clicks" Mobility   Outcome Measure  Help needed turning from your back to your side while in a flat bed without using bedrails?: Total Help needed moving from lying on your back to sitting on the side of a flat bed without using bedrails?: Total Help needed moving to and from a bed to a chair (including a wheelchair)?: Total Help needed standing up from a chair using your arms (e.g., wheelchair or bedside chair)?: Total Help needed to walk in hospital room?: Total Help needed climbing 3-5 steps with a railing? : Total 6 Click Score: 6    End of Session   Activity Tolerance: Patient limited by lethargy Patient left: in bed;with call bell/phone within reach;with bed alarm set Nurse Communication: Mobility status PT Visit Diagnosis: Other abnormalities of gait and mobility (R26.89);Hemiplegia and hemiparesis Hemiplegia - Right/Left: Right Hemiplegia - caused by: Nontraumatic intracerebral hemorrhage     Time: 1205-1232 PT Time Calculation (min) (ACUTE ONLY): 27 min  Charges:  $Therapeutic Exercise: 8-22 mins $Therapeutic Activity: 8-22 mins                     Karma Ganja, PT, DPT   Acute Rehabilitation Department Pager #: [phone]   Otho Bellows 01/26/2021, 1:04 PM

## 2021-01-26 NOTE — Progress Notes (Signed)
STROKE TEAM PROGRESS NOTE   INTERVAL HISTORY Febrile 101.8, will obtain work up Urine output marginal after bolus and IVF overnight, MIVF restarted.  Respirations mildly labored with upper airwaycongestion on nasal cannula O2 with adequate oxygen saturations Iin high 90s. RN reports patient maxed out on cleviprex overnight. Feeding tube has been placed with TF started.  Lethargic today but otherwise neurologic exam is stable.   No visitors at bedside. He is inattentive to examiners.  Vitals:   01/26/21 0600 01/26/21 0630 01/26/21 0700 01/26/21 0800  BP: (!) 154/60 (!) 161/53 (!) 175/58 137/64  Pulse: 100 99 (!) 103 92  Resp: 17 (!) 21 20 15   Temp:    (!) 101.7 F (38.7 C)  TempSrc:    Axillary  SpO2: 97% 98% 98% 97%  Weight:      Height:       CBC:  Recent Labs  Lab 01/23/21 1537 01/23/21 1543  WBC 4.5  --   NEUTROABS 2.7  --   HGB 11.6* 12.2*  HCT 38.4* 36.0*  MCV 88.9  --   PLT 185  --    Basic Metabolic Panel:  Recent Labs  Lab 01/23/21 1537 01/23/21 1543 01/25/21 1645 01/26/21 0744  NA 139 140  --   --   K 4.7 4.7  --   --   CL 104 103  --   --   CO2 27  --   --   --   GLUCOSE 151* 146*  --   --   BUN 16 20  --   --   CREATININE 1.22 1.20  --   --   CALCIUM 9.4  --   --   --   MG  --   --  1.7 1.8  PHOS  --   --  3.2 2.3*   Recent Labs  Lab 01/23/21 1542  HGBA1C 5.8*   Lipid panel is pending  IMAGING and DIAGNOSTICS Repeat CT head 5/11 No significant interval change in size and morphology of left thalamic hemorrhage. Surrounding edema has mildly increased from prior, although regional mass effect and localized left-to-right shift is not significantly changed. 2. Intraventricular extension with blood within the lateral and third ventricles, stable. Stable ventricular size and morphology without hydrocephalus or trapping. 3. No other new acute intracranial abnormality.  CT head: repeat on 5/9 Stable appearing left thalamic hemorrhage with  intraventricular component. No new focal abnormality is noted.  MRI Brain 1. Motion degraded exam. 2. 3.0 x 3.1 x 3.4 cm acute intraparenchymal hemorrhage centered at the left thalamus. Surrounding vasogenic edema with mild regional mass effect and 5 mm of left-to-right shift. No visible underlying mass lesion or other structure abnormality on this motion degraded exam. 3. Associated intraventricular extension with blood within the left greater than right lateral ventricles. No hydrocephalus or ventricular trapping at this time. 4. Underlying age-related cerebral atrophy with mild chronic small vessel ischemic disease, with a few small remote right cerebellar infarcts.  MRA HEAD 1. Technically limited exam due to extensive motion artifact. 2. Grossly negative intracranial MRA. No large vessel occlusion, hemodynamically significant stenosis, or other acute vascular abnormality. No vascular abnormality seen underlying the acute left thalamic hemorrhage. Please note that while a MRA neck was also ordered for this exam, this was unable to be performed due to the patient's inability to tolerate the full length of the study and extensive motion artifact  CT Head Code Stroke 5/9 Acute left thalamic hemorrhage with mild regional mass effect  and intraventricular extension. No hydrocephalus. Chronic microvascular ischemic changes. Small chronic right cerebellar infarct.  PHYSICAL EXAM  General:  Mildly obese elderly African-American male lying in bed, somewhat restless, resists exam.  HENT: Normal oropharynx and mucosa. Normal external appearance of ears and nose. Neck: Supple, no pain or tenderness CV: No JVD. No peripheral edema. Pulmonary: Symmetric Chest rise. Normal respiratory effort. Abdomen: Soft to touch, non-tender. Ext: No cyanosis, edema, or deformity Skin: No rash. Normal palpation of skin.  Musculoskeletal: Normal digits and nails by inspection. No  clubbing.  Neurologic Examination  Mental status/Cognition: Keeps eyes closed, Inattentive to examiner. Restless. Does not follow commands.  Speech/language: No verbal response to verbal or tactile stimuli  Cranial nerves:   CN II Pupils equal and reactive to light, ? Partial R hemianopsia.   CN III,IV,VI L gaze preference, does cross midline.   CN V No response to scratch on the R face.   CN VII R facial droop   CN VIII normal hearing to speech turns towards speech.   CN IX & X    CN XI    CN XII midline tongue protrusion   Motor:  Muscle bulk: normal, tone normal Unable to do full strength testing secondary to aphasia. Moves LUE and LLE spontaneously. Minimal movement in RUE, does withdraw RLE when eliciting planter reflex.  Only trace withdrawal in the right upper extremity.  Tone is increased on the right side.  Gait not tested.  ASSESSMENT/PLAN Brian [name] is a 85 y.o. male presenting with L Thalamic ICH with some IVH and an ICH score of 2. Unable to provide any history secondary to the stroke but given Eliquis listed on home medications, suspect the ICH is secondary to Eliquis. He is s/p Andexanet alpha for presumed ICH on Eliquis.  ICH with associated cerebral edema in the setting hypertensive emergency and apixaban therapy for paroxysmal atrial fibrillation.   Repeat HCT 5/11 stable hemorrhage  Code Stroke: Acute left thalamic hemorrhage with regional mass effect and IVH  MRI head: 3.0 x 3.1 x 3.4 cm acute intraparenchymal hemorrhage centered at the left thalamus. Surrounding vasogenic edema with mild regional mass effect and 5 mm of left-to-right shift.  MRA head: Grossly negative, No LVO  MRA neck: unable to complete due to extensive motion  2D Echo: EF 55-60%, +Mildly dilated left atrial size, Grade 1 diastolic dysfunction. No thrombus, wall motion abnormality or shunt found.   Repeat CT Head 5/1: stable IPH, edema and mass effect.   VTE prophylaxis -  PPX  On Eliquis for known Aflutter prior to admission. On hold due to hemorrhage. No anticoagulant or antiplatelets for now.   Transfer to floor when able to transition away from IV medications for hypertension with stable BP  Therapy recommendations:  SNF  Disposition:  TBD  Atrial fibrillation  Home medication: Eliquis   Followed by Dr. Harrington Challenger  2019 Echo showed EF 45-50%  now with improved EF 55-60%   Hypertension  Home meds: Norvasc 10, Coreg 12.5, Cozaar 100mg    Unstable, hypertensive emergency on arrival. Requiring cleviprex infusion. Weaning attempt again discussed with RN to prn IV medications.  Home medications of Norvasc 10mg  and partial dose home cozaar restarted.   BP goal less than 160.  Hyperlipidemia  Home meds:  Lipitor 80mg   LDL at 55, goal < 70  High intensity statin: at goal with home regimen. Hold statin in setting of hemorrhage.   Consider continuing statin at discharge  Hyponatremia Na 134, mild.  Monitor  DM2, controlled  Home meds are glargine 14 u hs, metformin 1000 BID, Januvia 100 daily  HgbA1c 5.8, goal < 7.0  CBGs Recent Labs    01/24/21 0345 01/24/21 0816 01/24/21 1200  GLUCAP 151* 130* 127*      SSI  NPO  Dysphagia Feeding  Failed swallow eval 5/11, NPO rec  Cortrack tube placed  Jevity @ 60cc/hr  AKI  Cre 1.22->1.39  Setting of severe hypertension  Follow renal function and urine output  Avoid nephrotoxins  Bolus and MIVF overnight  Continue MIVF  Fever Leukocytosis   Monitor fever curve  CXR: no acute findings  CBC with leukocytosis wbc 13  UA: rare bacteria,  Urine Culture pending  Consider blood cultures if fever is persistent per Dr. Leonie Man  Other Stroke Risk Factors  Advanced Age >/= 78   Former Cigarette smoker quit 1995.   Current ETOH use, advised to drink no more than 2       drink(s) a day  Overweight, Body mass index is 28.46 kg/m., BMI >/= 30 associated with increased  stroke risk, recommend weight loss, diet and exercise as appropriate   Hx stroke/TIA  Other Active Problems Fever  I have personally obtained history,examined this patient, reviewed notes, independently viewed imaging studies, participated in medical decision making and plan of care.ROS completed by me personally and pertinent positives fully documented  I have made any additions or clarifications directly to the above note. Agree with note above.  Patient is neurological exam remains unchanged with his febrile hence we will check UA, chest x-Brian [name] and if he spikes again will culture and start antibiotics for presumed aspiration pneumonia.  Continue to wean Cleviprex drip and consult medical hospitalist team resume care once he leaves ICU. This patient is critically ill and at significant risk of neurological worsening, death and care requires constant monitoring of vital signs, hemodynamics,respiratory and cardiac monitoring, extensive review of multiple databases, frequent neurological assessment, discussion with family, other specialists and medical decision making of high complexity.I have made any additions or clarifications directly to the above note.This critical care time does not reflect procedure time, or teaching time or supervisory time of PA/NP/Med Resident etc but could involve care discussion time.  I spent 30 minutes of neurocritical care time  in the care of  this patient.      Antony Contras, MD Medical Director [place] Pager: [phone] 01/26/2021 3:48 PM    To contact Stroke Continuity provider, please refer to [email or web address] After hours, contact General Neurology

## 2021-01-26 NOTE — Progress Notes (Addendum)
  Speech Language Pathology Treatment: Dysphagia  Patient Details Name: Brian [name] MRN: [id number] DOB: 11/05/1935 Today's Date: 01/26/2021 Time: 8016-5537 SLP Time Calculation (min) (ACUTE ONLY): 9 min  Assessment / Plan / Recommendation Clinical Impression  SLP followed up for PO readiness. Pts eyes closed initially. Brian [name] did open eyes briefly during oral care but proceeded to close during remainder of the session. No verbal output exhibited this date despite cues, global aphasia persists.Trialed single ice chip and 1/4 tsp puree. Brian [name] with no attempts for bolus propulsion and reduced awareness of PO. Extracted via oral care with suction. Continue NPO. SLP to continue to closely monitor.    HPI HPI: Brian [name] is a 85 y.o. M who presents with acute onset R sided weakness, facial droop, numbness, aphasia with left gaze preference. CTH showing L thalamic ICH. Significant PMH: afib, DM2, HLD, HTN, stroke      SLP Plan  Continue with current plan of care       Recommendations  Diet recommendations: NPO Medication Administration: Via alternative means                Oral Care Recommendations: Oral care QID Follow up Recommendations: Skilled Nursing facility SLP Visit Diagnosis: Dysphagia, unspecified (R13.10) Plan: Continue with current plan of care       [place], [place]   01/26/2021, 9:19 AM

## 2021-01-26 NOTE — TOC Initial Note (Signed)
Transition of Care [facility]) - Initial/Assessment Note    Patient Details  Name: Brian [name] MRN: [id number] Date of Birth: [birth year]  [facility]) CM/SW Contact:    Benard Halsted, LCSW Phone Number: 01/26/2021, 5:08 PM  Clinical Narrative:                 CSW received SNF consult and left voicemail for patient's son, [name].   Expected Discharge Plan: Skilled Nursing Facility Barriers to Discharge: Insurance Authorization,Continued Medical Work up   Patient Goals and CMS Choice Patient states their goals for this hospitalization and ongoing recovery are:: Rehab CMS Medicare.gov Compare Post Acute Care list provided to:: Patient Represenative (must comment) Choice offered to / list presented to : Adult Children  Expected Discharge Plan and Services Expected Discharge Plan: [place] In-house Referral: Clinical Social Work   Post Acute Care Choice: [place] Living arrangements for the past 2 months: [place]                                      Prior Living Arrangements/Services Living arrangements for the past 2 months: Single Family Home Lives with:: Self Patient language and need for interpreter reviewed:: Yes Do you feel safe going back to the place where you live?: No   needs more assitance  Need for Family Participation in Patient Care: Yes (Comment) Care giver support system in place?: Yes (comment)   Criminal Activity/Legal Involvement Pertinent to Current Situation/Hospitalization: No - Comment as needed  Activities of Daily Living      Permission Sought/Granted Permission sought to share information with : Facility Contact Representative,Family Supports Permission granted to share information with : No  Share Information with NAME: [name]     Permission granted to share info w Relationship: Son  Permission granted to share info w Contact Information: [phone]  Emotional Assessment Appearance::  Appears stated age Attitude/Demeanor/Rapport: Unable to Assess Affect (typically observed): Unable to Assess Orientation: :  (Disoriented x4) Alcohol / Substance Use: Not Applicable Psych Involvement: No (comment)  Admission diagnosis:  Primary hypertension [I10] Hemorrhagic stroke ([place]) [I61.9] ICH (intracerebral hemorrhage) ([place]) [I61.9] Patient Active Problem List   Diagnosis Date Noted  . ICH (intracerebral hemorrhage) ([place]) 01/23/2021  . Skin lesion of right leg 11/14/2020  . Uncontrolled type 2 diabetes mellitus with circulatory disorder causing erectile dysfunction ([place]) 11/14/2020  . Bee sting reaction 07/10/2020  . Vitamin D deficiency 07/10/2020  . Right lumbar radiculopathy 10/20/2019  . Insomnia 01/31/2019  . Acute ischemic left ACA stroke ([place]) 06/23/2018  . History of CVA (cerebrovascular accident)   . PAF (paroxysmal atrial fibrillation) ([place])   . Diastolic dysfunction   . Acute CVA (cerebrovascular accident) ([place]) 04/09/2018  . Cerebral embolism with cerebral infarction 04/02/2018  . Right leg pain 12/04/2017  . Grief 12/04/2017  . Poorly controlled type 2 diabetes mellitus with circulatory disorder ([place]) 10/02/2016  . Allergic conjunctivitis 01/12/2016  . Abdominal pain, other specified site 06/23/2012  . Erectile dysfunction 12/13/2011  . Ejection fraction   . Fatigue 06/14/2011  . Encounter for well adult exam with abnormal findings 06/10/2011  . Nephrolithiasis 12/07/2010  . Hypertension, uncontrolled 12/07/2010  . SKIN LESION 03/01/2008  . ALLERGIC RHINITIS 10/13/2007  . Mixed hyperlipidemia 03/31/2007  . Overweight (BMI 25.0-29.9) 03/31/2007  . ANEMIA, CHRONIC DISEASE NEC 03/31/2007  . POLYP, ANAL AND RECTAL 03/31/2007  . BENIGN  PROSTATIC HYPERTROPHY 03/31/2007  . [place] DISEASE, LUMBAR 03/31/2007   PCP:  [name], MD Pharmacy:   [id number] - [address] [place] [address] Phone:  [phone] Fax: [phone]     Social Determinants of Health (SDOH) Interventions    Readmission Risk Interventions No flowsheet data found.

## 2021-01-27 ENCOUNTER — Inpatient Hospital Stay (HOSPITAL_COMMUNITY): Payer: Medicare HMO

## 2021-01-27 DIAGNOSIS — I610 Nontraumatic intracerebral hemorrhage in hemisphere, subcortical: Secondary | ICD-10-CM

## 2021-01-27 DIAGNOSIS — I471 Supraventricular tachycardia: Secondary | ICD-10-CM

## 2021-01-27 LAB — CBC
HCT: 38.3 % — ABNORMAL LOW (ref 39.0–52.0)
Hemoglobin: 11.7 g/dL — ABNORMAL LOW (ref 13.0–17.0)
MCH: 26.1 pg (ref 26.0–34.0)
MCHC: 30.5 g/dL (ref 30.0–36.0)
MCV: 85.3 fL (ref 80.0–100.0)
Platelets: 169 10*3/uL (ref 150–400)
RBC: 4.49 MIL/uL (ref 4.22–5.81)
RDW: 14.2 % (ref 11.5–15.5)
WBC: 8.8 10*3/uL (ref 4.0–10.5)
nRBC: 0.2 % (ref 0.0–0.2)

## 2021-01-27 LAB — COMPREHENSIVE METABOLIC PANEL
ALT: 21 U/L (ref 0–44)
AST: 34 U/L (ref 15–41)
Albumin: 3.3 g/dL — ABNORMAL LOW (ref 3.5–5.0)
Alkaline Phosphatase: 52 U/L (ref 38–126)
Anion gap: 11 (ref 5–15)
BUN: 34 mg/dL — ABNORMAL HIGH (ref 8–23)
CO2: 21 mmol/L — ABNORMAL LOW (ref 22–32)
Calcium: 9.1 mg/dL (ref 8.9–10.3)
Chloride: 110 mmol/L (ref 98–111)
Creatinine, Ser: 1.11 mg/dL (ref 0.61–1.24)
GFR, Estimated: >60 mL/min (ref >60)
Glucose, Bld: 216 mg/dL — ABNORMAL HIGH (ref 70–99)
Potassium: 3.9 mmol/L (ref 3.5–5.1)
Sodium: 142 mmol/L (ref 135–145)
Total Bilirubin: 1.1 mg/dL (ref 0.3–1.2)
Total Protein: 6.7 g/dL (ref 6.5–8.1)

## 2021-01-27 LAB — CBC WITH DIFFERENTIAL/PLATELET
Abs Immature Granulocytes: 0.09 10*3/uL — ABNORMAL HIGH (ref 0.00–0.07)
Basophils Absolute: 0 10*3/uL (ref 0.0–0.1)
Basophils Relative: 0 %
Eosinophils Absolute: 0.4 10*3/uL (ref 0.0–0.5)
Eosinophils Relative: 4 %
HCT: 37.1 % — ABNORMAL LOW (ref 39.0–52.0)
Hemoglobin: 11.4 g/dL — ABNORMAL LOW (ref 13.0–17.0)
Immature Granulocytes: 1 %
Lymphocytes Relative: 9 %
Lymphs Abs: 1 10*3/uL (ref 0.7–4.0)
MCH: 26.1 pg (ref 26.0–34.0)
MCHC: 30.7 g/dL (ref 30.0–36.0)
MCV: 85.1 fL (ref 80.0–100.0)
Monocytes Absolute: 1.2 10*3/uL — ABNORMAL HIGH (ref 0.1–1.0)
Monocytes Relative: 11 %
Neutro Abs: 7.9 10*3/uL — ABNORMAL HIGH (ref 1.7–7.7)
Neutrophils Relative %: 75 %
Platelets: 165 10*3/uL (ref 150–400)
RBC: 4.36 MIL/uL (ref 4.22–5.81)
RDW: 13.9 % (ref 11.5–15.5)
WBC: 10.6 10*3/uL — ABNORMAL HIGH (ref 4.0–10.5)
nRBC: 0.2 % (ref 0.0–0.2)

## 2021-01-27 LAB — BASIC METABOLIC PANEL
Anion gap: 9 (ref 5–15)
BUN: 29 mg/dL — ABNORMAL HIGH (ref 8–23)
CO2: 24 mmol/L (ref 22–32)
Calcium: 9.2 mg/dL (ref 8.9–10.3)
Chloride: 105 mmol/L (ref 98–111)
Creatinine, Ser: 1.17 mg/dL (ref 0.61–1.24)
GFR, Estimated: >60 mL/min (ref >60)
Glucose, Bld: 223 mg/dL — ABNORMAL HIGH (ref 70–99)
Potassium: 3.8 mmol/L (ref 3.5–5.1)
Sodium: 138 mmol/L (ref 135–145)

## 2021-01-27 LAB — GLUCOSE, CAPILLARY
Glucose-Capillary: 148 mg/dL — ABNORMAL HIGH (ref 70–99)
Glucose-Capillary: 158 mg/dL — ABNORMAL HIGH (ref 70–99)
Glucose-Capillary: 178 mg/dL — ABNORMAL HIGH (ref 70–99)
Glucose-Capillary: 193 mg/dL — ABNORMAL HIGH (ref 70–99)
Glucose-Capillary: 216 mg/dL — ABNORMAL HIGH (ref 70–99)
Glucose-Capillary: 225 mg/dL — ABNORMAL HIGH (ref 70–99)
Glucose-Capillary: 262 mg/dL — ABNORMAL HIGH (ref 70–99)

## 2021-01-27 LAB — SODIUM
Sodium: 138 mmol/L (ref 135–145)
Sodium: 143 mmol/L (ref 135–145)

## 2021-01-27 LAB — MAGNESIUM: Magnesium: 2 mg/dL (ref 1.7–2.4)

## 2021-01-27 MED ORDER — MAGNESIUM SULFATE 2 GM/50ML IV SOLN
2.0000 g | Freq: Once | INTRAVENOUS | Status: AC
  Administered 2021-01-27: 2 g via INTRAVENOUS
  Filled 2021-01-27: qty 50

## 2021-01-27 MED ORDER — ADENOSINE 6 MG/2ML IV SOLN
INTRAVENOUS | Status: AC
  Administered 2021-01-27: 6 mg
  Filled 2021-01-27: qty 2

## 2021-01-27 MED ORDER — METOPROLOL TARTRATE 25 MG PO TABS
25.0000 mg | ORAL_TABLET | Freq: Two times a day (BID) | ORAL | Status: DC
  Administered 2021-01-27 – 2021-01-28 (×2): 25 mg
  Filled 2021-01-27 (×2): qty 1

## 2021-01-27 MED ORDER — INSULIN ASPART 100 UNIT/ML IJ SOLN
5.0000 [IU] | INTRAMUSCULAR | Status: DC
  Administered 2021-01-27 – 2021-02-01 (×23): 5 [IU] via SUBCUTANEOUS

## 2021-01-27 MED ORDER — POTASSIUM CHLORIDE 20 MEQ PO PACK
40.0000 meq | PACK | Freq: Once | ORAL | Status: AC
  Administered 2021-01-27: 40 meq
  Filled 2021-01-27: qty 2

## 2021-01-27 MED ORDER — SODIUM CHLORIDE 0.9 % IV SOLN
3.0000 g | Freq: Three times a day (TID) | INTRAVENOUS | Status: DC
  Filled 2021-01-27 (×2): qty 8

## 2021-01-27 MED ORDER — SODIUM CHLORIDE 0.9 % IV SOLN
3.0000 g | Freq: Four times a day (QID) | INTRAVENOUS | Status: AC
  Administered 2021-01-27 – 2021-02-03 (×27): 3 g via INTRAVENOUS
  Filled 2021-01-27: qty 8
  Filled 2021-01-27 (×5): qty 3
  Filled 2021-01-27: qty 8
  Filled 2021-01-27: qty 3
  Filled 2021-01-27: qty 8
  Filled 2021-01-27 (×2): qty 3
  Filled 2021-01-27: qty 8
  Filled 2021-01-27 (×4): qty 3
  Filled 2021-01-27 (×2): qty 8
  Filled 2021-01-27 (×3): qty 3
  Filled 2021-01-27: qty 8
  Filled 2021-01-27 (×3): qty 3
  Filled 2021-01-27: qty 8
  Filled 2021-01-27: qty 3
  Filled 2021-01-27: qty 8

## 2021-01-27 MED ORDER — SODIUM CHLORIDE 3 % IV SOLN
INTRAVENOUS | Status: DC
  Filled 2021-01-27 (×4): qty 500

## 2021-01-27 MED ORDER — ADENOSINE 6 MG/2ML IV SOLN
6.0000 mg | Freq: Once | INTRAVENOUS | Status: AC
  Administered 2021-01-27: 6 mg via INTRAVENOUS
  Filled 2021-01-27: qty 2

## 2021-01-27 MED ORDER — ENOXAPARIN SODIUM 40 MG/0.4ML IJ SOSY
40.0000 mg | PREFILLED_SYRINGE | INTRAMUSCULAR | Status: DC
  Administered 2021-01-27 – 2021-02-07 (×12): 40 mg via SUBCUTANEOUS
  Filled 2021-01-27 (×12): qty 0.4

## 2021-01-27 MED ORDER — LABETALOL HCL 5 MG/ML IV SOLN
10.0000 mg | INTRAVENOUS | Status: DC | PRN
  Administered 2021-01-27 – 2021-01-30 (×14): 10 mg via INTRAVENOUS
  Filled 2021-01-27 (×14): qty 4

## 2021-01-27 MED ORDER — METOPROLOL TARTRATE 25 MG PO TABS: 25.0000 mg | ORAL_TABLET | Freq: Two times a day (BID) | ORAL | Status: DC

## 2021-01-27 NOTE — Progress Notes (Signed)
EEG Completed; Results Pending  

## 2021-01-27 NOTE — Progress Notes (Signed)
OT NOTE  RN STAFF  Please check splint every 4 hours during shift ( remove splint ) to assess for: * pain * redness *swelling  If any symptoms above present remove splint for 15 minutes. If symptoms continue - keep the splint removed and notify OT staff [phone] immediately.   Keep the UE elevated at all times on pillows / towels.  Splint should have two splint covers (blue cloth) with a mesh bag for cleaning. The splint cover (blue cloth) should be washed with soapy water and hung out to dry or washed on delicate in home washer. Do not throw away splint cover it is washable. Please have a set location to store the splints in patients room for daily application.    Splints are to be worn for 4 hours and off for 4 hours  Examples of schedule:  Splints off at 4am Splints on at 8 am Splints off at 12 pm Splints on at 4 pm Splints off at 6pm Splints on at 8 pm    To place the splint on:  1. Place the wrist in position first and secure strap 2. Position each digit and apply strap 3. The thumb and forearm strap should be applied last   The splints should fit as appeared here Strap over the PIP joint of finger Strap over the MCP ( knuckles)  joints of the hand Strap at the thumb Strap at the wrist Strap at the forearm   Fleeta Emmer, OTR/L  Acute Rehabilitation Services Pager: [phone] Office: [phone] .

## 2021-01-27 NOTE — Progress Notes (Signed)
  Speech Language Pathology Treatment: Dysphagia;Cognitive-Linquistic  Patient Details Name: Brian [name] MRN: [id number] DOB: 1936/04/20 Today's Date: 01/27/2021 Time: 1210-1225 SLP Time Calculation (min) (ACUTE ONLY): 15 min  Assessment / Plan / Recommendation Clinical Impression  Pt is not recommended for po's or instrumental at present primarily due to his alertness level. He participated in po trials with water and pudding for 5 minutes with s/s compromised airway. Throat clearing consistently immediately and delayed after straw sips thin and pudding. Fed himself with hand over hand assist until he fatigued/sleepy. Prognosis for participation in instrumental assessment when appropriate is good once he can maintain alert state for longer periods.  Aphasia continues to be significant as visual, written assist for comprehension was not effective. He did not follow commands or gesture to y/n questions with inattention to right side. Vocalizations not present spontaneously (except throat clearing with po's), or singing to song/video on You tube. ST will continue.   HPI HPI: Pt is a 85 y.o. M who presents with acute onset R sided weakness, facial droop, numbness, aphasia with left gaze preference. CTH showing L thalamic ICH. Significant PMH: afib, DM2, HLD, HTN, stroke      SLP Plan  Continue with current plan of care       Recommendations  Diet recommendations: NPO Medication Administration: Via alternative means                Oral Care Recommendations: Oral care QID Follow up Recommendations: Skilled Nursing facility SLP Visit Diagnosis: Dysphagia, unspecified (R13.10);Aphasia (R47.01) Plan: Continue with current plan of care                       Houston Siren 01/27/2021, 2:32 PM  Brian [name] Risk analyst [phone] Office [phone]

## 2021-01-27 NOTE — Progress Notes (Addendum)
MD [place] paged [phone] as HR sustaining 150s x41min; BP now 80s/60s MAP 70s.  Pt resting in bed with eyes open, moving left side. PRN labetalol given 1625 per orders for SBP >160. TF stopped at this time.   Return page [phone], CCM consulted per P. Sethi. No orders at this time.   [place] at bedside. EKG obtained, stat lab orders placed. Pt placed on 2L [place].  1730 MD J. Dewald to bedside, orders for adenosine; CCM MD, PA, NP and this RN as well as other unit RN at bedside for adenosine administration--see MAR for admin times; HR now elevated to 170s SVT. EKG obtained post adenosine; HR low 100s with 1st degree heart block. See orders/ CCM note. Pt resting in bed, this RN attempted to explain episode.

## 2021-01-27 NOTE — Progress Notes (Signed)
Occupational Therapy Treatment Patient Details Name: Brian [name] MRN: [id number] DOB: Oct 29, 1935 Today's Date: 01/27/2021    History of present illness Pt is a 85 y.o. M who presents with acute onset R sided weakness, facial droop, numbness, aphasia with left gaze preference. CTH showing L thalamic ICH. Significant PMH: afib, DM2, HLD, HTN, stroke.   OT comments  Pt aroused and localized to name call on arrival. Pt following simple command to kick L LE and delayed response to lifting L UE. Pt perseverating at times on previous commands. Pt requires 15 second time to respond to commands. Recommendation for SNf at this time.   Follow Up Recommendations  SNF;Supervision/Assistance - 24 hour    Equipment Recommendations  Wheelchair (measurements OT);Wheelchair cushion (measurements OT);Hospital bed (lift)    Recommendations for Other Services      Precautions / Restrictions Precautions Precautions: Fall;Other (comment) Precaution Comments: R hemiparesis, global aphasia, cortrak       Mobility Bed Mobility Overal bed mobility: Needs Assistance Bed Mobility: Rolling;Sit to Supine;Supine to Sit Rolling: Total assist;+2 for physical assistance   Supine to sit: Total assist;+2 for physical assistance Sit to supine: Total assist;+2 for physical assistance   General bed mobility comments: total (A) sitting and when fully aroused pushing with L UE and total (A) due to posterior bias. Pt requires hand over hand facilitation of R UE for weight bearing    Transfers                 General transfer comment: not tested    Balance Overall balance assessment: Needs assistance Sitting-balance support: Feet supported Sitting balance-Leahy Scale: Poor   Postural control:  (LOB in all directions)                                 ADL either performed or assessed with clinical judgement   ADL Overall ADL's : Needs assistance/impaired                                        General ADL Comments: total (A) for all adls. attempted to wash face. Pt taking wash cloth but resisting cloth toward face with hand over hand. pt reaching with L UE to scratch face and inspect cortrak at times     Vision       Perception     Praxis      Cognition Arousal/Alertness: Lethargic Behavior During Therapy: Flat affect Overall Cognitive Status: Impaired/Different from baseline Area of Impairment: Following commands                       Following Commands: Follows one step commands inconsistently;Follows one step commands with increased time       General Comments: pt following commands on L side only. pt does hold R hand with L hand when positioned. pt requires 15 second delay to respond to all commans        Exercises Exercises: Other exercises Other Exercises Other Exercises: PROM of R UE for extension of shoulder, elbow wrist and hand   Shoulder Instructions       General Comments BP initially with 13 point SBP drop and 17 point DBP drop. pt with trunk activation task and rebounded with increased arousal. pt requires arousal to maintain BP    Pertinent Vitals/ Pain  Pain Assessment: No/denies pain  Home Living                                          Prior Functioning/Environment              Frequency  Min 2X/week        Progress Toward Goals  OT Goals(current goals can now be found in the care plan section)  Progress towards OT goals: Progressing toward goals  Acute Rehab OT Goals Patient Stated Goal: none stated OT Goal Formulation: Patient unable to participate in goal setting Time For Goal Achievement: 02/07/21 Potential to Achieve Goals: Good ADL Goals Pt Will Perform Grooming: with min assist;sitting Pt Will Transfer to Toilet: with max assist;with +2 assist;squat pivot transfer;bedside commode Pt/caregiver will Perform Home Exercise Program: Right Upper extremity;Increased  ROM;Increased strength;With minimal assist Additional ADL Goal #1: Pt will attend to task x5 mins with moderate cues to stay alert. Additional ADL Goal #2: Pt will increase to sitting EOB x10 mins with minguardA for static sitting as precursor for ADL.  Plan Discharge plan remains appropriate    Co-evaluation    PT/OT/SLP Co-Evaluation/Treatment: Yes Reason for Co-Treatment: Complexity of the patient's impairments (multi-system involvement);Necessary to address cognition/behavior during functional activity;For patient/therapist safety;To address functional/ADL transfers   OT goals addressed during session: ADL's and self-care;Proper use of Adaptive equipment and DME;Strengthening/ROM      AM-PAC OT "6 Clicks" Daily Activity     Outcome Measure   Help from another person eating meals?: Total Help from another person taking care of personal grooming?: Total Help from another person toileting, which includes using toliet, bedpan, or urinal?: Total Help from another person bathing (including washing, rinsing, drying)?: Total Help from another person to put on and taking off regular upper body clothing?: Total Help from another person to put on and taking off regular lower body clothing?: Total 6 Click Score: 6    End of Session Equipment Utilized During Treatment: Oxygen  OT Visit Diagnosis: Unsteadiness on feet (R26.81);Muscle weakness (generalized) (M62.81);Other symptoms and signs involving cognitive function;Hemiplegia and hemiparesis Hemiplegia - Right/Left: Right Hemiplegia - dominant/non-dominant: Dominant Hemiplegia - caused by: Nontraumatic intracerebral hemorrhage   Activity Tolerance Patient limited by fatigue   Patient Left in bed;with call bell/phone within reach;with bed alarm set   Nurse Communication Mobility status        Time: 3329-5188 OT Time Calculation (min): 21 min  Charges: OT General Charges $OT Visit: 1 Visit OT Treatments $Self Care/Home  Management : 8-22 mins   Brynn, OTR/L  Acute Rehabilitation Services Pager: [phone] Office: [phone] .    Jeri Modena 01/27/2021, 1:09 PM

## 2021-01-27 NOTE — TOC Progression Note (Signed)
Transition of Care [facility]) - Progression Note    Patient Details  Name: GEROLD SAR MRN: [id number] Date of Birth: 02-07-1936  [facility] CM/SW [place], LCSW Phone Number: 01/27/2021, 12:36 PM  Clinical Narrative:    CSW received return call from patient's son, [name]. CSW discussed SNF recommendation with him. He stated he lives in [place] and his Barbaraann Rondo is currently unable to care for patient given patient's current physical needs and fall risk. He expressed understanding of PT recommendation and is agreeable to SNF placement at time of discharge. He stated patient has been to SNF before [facility] but they are not in network with [employer] anymore). CSW discussed insurance authorization process and provided Medicare SNF ratings list. Patient has received the COVID vaccines and booster. No further questions reported at this time.     Expected Discharge Plan: [place] Barriers to Discharge: Insurance Authorization,Continued Medical Work up  Expected Discharge Plan and Services Expected Discharge Plan: [place] In-house Referral: Clinical Social Work   Post Acute Care Choice: [place] Living arrangements for the past 2 months: Single Family Home                                       Social Determinants of Health (SDOH) Interventions    Readmission Risk Interventions No flowsheet data found.

## 2021-01-27 NOTE — Consult Note (Signed)
NAME:  Brian [name], MRN:  [id number], DOB:  05-Apr-1936, LOS: 4 ADMISSION DATE:  01/23/2021, CONSULTATION DATE: 5/13 REFERRING MD:  Dr. Stroke, CHIEF COMPLAINT: ICH  History of Present Illness:   Patient is 85 yo AA male admitted to [facility] on 01/23/2021 for acute onset right sided weakness and language deficit. Pertinent PMH of HTN, HLD, EF 45-50% on 2019, T2DM, Chronic anemia, Afib on Eliquis at home, prior L MCa Stroke. CTH w/o contrast and MRI on 5/9 showing L thalamic ICH with cytoxic edema/with mild 2m left to right shift. Patient was anticoagulated with Apixaban for A-fib which was reversed in ED with Andexanet Alpha. Also requiring Cleviprex infusion for hypertensive emergency. Admitted to Neuro ICU under the stroke service.   Course complicated with ongoing HTN requiring Cleviprex infusion. Mentation initially somewhat improving. Patient developed fever with increased lethargy on 5/12. Failed swallow eval and core-track was placed. Cleviprex stopped on 5/12 and started on oral anti hypertensives. On 5/13 low grade fever persist with downtrending WBC. EEG shows focal left-sided slowing but no focal seizures. Hypertonic saline started on 5/13. Patient developed HR in the 150-160s and PCCM consulted.   Pertinent  Medical History   Past Medical History:  Diagnosis Date  . ALLERGIC RHINITIS 10/13/2007  . ANEMIA, CHRONIC DISEASE NEC 03/31/2007  . BENIGN PROSTATIC HYPERTROPHY 03/31/2007  . Chest pain    Stress echo, normal, December, 2012  . Chronic anemia   . DIABETES MELLITUS, TYPE II 03/31/2007  . DArpelarDISEASE, LUMBAR 03/31/2007  . ED (erectile dysfunction)   . Ejection fraction    EF  normal, stress echo, December,  2012  . History of prostatitis   . HYPERLIPIDEMIA 03/31/2007  . HYPERTENSION 03/31/2007  . Nephrolithiasis 12/07/2010  . Obesity   . POLYP, ANAL AND RECTAL 03/31/2007  . Right eye trauma    hx as child  . Stroke ([facility]      Significant Hospital Events: Including procedures,  antibiotic start and stop dates in addition to other pertinent events   . 5/9- admitted for ICH.  .[name]5/9- CT head w/o: acute left thalamic hemorrhage with mild mass effect. . 5/9 MR brain w/o: 3.0 x 3.1 x 3.4 cm acute intraparenchymal hemorrhage centered at the left thalamus. Surrounding vasogenic edema with mild regional mass effect and 5 mm of left-to-right shift. . 5/11 CT Head w/o: No significant change. Surrounding edema mildly increased.  . 5/13 Increasing lethargy. Repeat CT showed increasing midline shift. Hypertonic saline initiated.   Interim History / Subjective:    Objective   Blood pressure 94/64, pulse (!) 163, temperature 98.5 F (36.9 C), temperature source Axillary, resp. rate (!) 25, height 6' (1.829 m), weight 95.2 kg, SpO2 95 %.        Intake/Output Summary (Last 24 hours) at 01/27/2021 1801 Last data filed at 01/27/2021 1600 Gross per 24 hour  Intake 2889.34 ml  Output 925 ml  Net 1964.34 ml   Filed Weights   01/23/21 2245 01/26/21 0500  Weight: 97.8 kg 95.2 kg    Examination: General:  Lethargic ill appearing male. HEENT: MM pink/moist. Core track in place Neuro: not able to follow commands. lethargic CV: HR 170s, appears regular, s1s2, no m/r/g PULM: bibasilar crackles, O2 sats mid 90s on RA GI: soft, bsx4 active  Extremities: warm/dry Skin: no rashes or lesions appreciated   Labs/imaging that I havepersonally reviewed  (right click and "Reselect all SmartList Selections" daily)  Imaging as above, cbc, cmp  Resolved Hospital Problem  list     Assessment & Plan:   ICH: Left thalamic ICH with 7 mm midline shift Hypertensive crisis -Management per stroke service -Hypertonic saline started at 75 ml/hr -BP management for Systolic <833 -Amlodipine, Losartan, Metoprolol scheduled -PRN labetolol and hydralazine -continue statin -Continue to hold Eliquis   SVT: patient HR in 170s with narrow complex when arrived to bedside. Treated with 6 mg, then  12 mg of Adenosine. Rhythm slowed to normal sinus then mild sinus tachycardia. EKG showing sinus tachycardia with 1st degree block. Patient was hypotensive with SVT but BP improved after converting to sinus tachycardia.  Atrial Fibrillation: Eliquis  -holding anticoagulation -Start metoprolol 25 mg BID -mag and K given -Stat BMP, CBC, mag pending -ongoing telemetry monitoring  Possible aspiration pneumonia -Starting Unasyn for likely aspiration -Sputum culture sent -Trend fever curve, CBC  DMT2 -SSI  AKI: improving -Follow BMP  Dysphagia -TF via Core-track    Best practice (right click and "Reselect all SmartList Selections" daily)  Diet:  Tube Feed  Pain/Anxiety/Delirium protocol (if indicated): No VAP protocol (if indicated): Not indicated DVT prophylaxis: Contraindicated GI prophylaxis: PPI Glucose control:  SSI Yes Central venous access:  N/A Arterial line:  N/A Foley:  N/A Mobility:  bed rest  PT consulted: Yes Last date of multidisciplinary goals of care discussion []  Code Status:  full code Disposition: ICU  Labs   CBC: Recent Labs  Lab 01/23/21 1537 01/23/21 1543 01/26/21 0907 01/27/21 0445  WBC 4.5  --  13.0* 10.6*  NEUTROABS 2.7  --   --  7.9*  HGB 11.6* 12.2* 11.8* 11.4*  HCT 38.4* 36.0* 38.5* 37.1*  MCV 88.9  --  84.6 85.1  PLT 185  --  178 825    Basic Metabolic Panel: Recent Labs  Lab 01/23/21 1537 01/23/21 1543 01/25/21 1645 01/26/21 0744 01/26/21 0907 01/26/21 1708 01/27/21 0445 01/27/21 1425  NA 139 140  --   --  134*  --  138 138  K 4.7 4.7  --   --  4.0  --  3.8  --   CL 104 103  --   --  102  --  105  --   CO2 27  --   --   --  23  --  24  --   GLUCOSE 151* 146*  --   --  332*  --  223*  --   BUN 16 20  --   --  30*  --  29*  --   CREATININE 1.22 1.20  --   --  1.39*  --  1.17  --   CALCIUM 9.4  --   --   --  9.0  --  9.2  --   MG  --   --  1.7 1.8  --  1.8  --   --   PHOS  --   --  3.2 2.3*  --  3.2  --   --     GFR: Estimated Creatinine Clearance: 57.2 mL/min (by C-G formula based on SCr of 1.17 mg/dL). Recent Labs  Lab 01/23/21 1537 01/26/21 0907 01/27/21 0445  WBC 4.5 13.0* 10.6*    Liver Function Tests: Recent Labs  Lab 01/23/21 1537  AST 23  ALT 21  ALKPHOS 55  BILITOT 0.8  PROT 6.7  ALBUMIN 4.0   No results for input(s): LIPASE, AMYLASE in the last 168 hours. No results for input(s): AMMONIA in the last 168 hours.  ABG  Component Value Date/Time   TCO2 30 01/23/2021 1543     Coagulation Profile: Recent Labs  Lab 01/23/21 1537  INR 1.3*    Cardiac Enzymes: No results for input(s): CKTOTAL, CKMB, CKMBINDEX, TROPONINI in the last 168 hours.  HbA1C: Hgb A1c MFr Bld  Date/Time Value Ref Range Status  01/23/2021 03:42 PM 5.8 (H) 4.8 - 5.6 % Final    Comment:    (NOTE)         Prediabetes: 5.7 - 6.4         Diabetes: >6.4         Glycemic control for adults with diabetes: <7.0   11/14/2020 01:45 PM 6.0 4.6 - 6.5 % Final    Comment:    Glycemic Control Guidelines for People with Diabetes:Non Diabetic:  <6%Goal of Therapy: <7%Additional Action Suggested:  >8%     CBG: Recent Labs  Lab 01/26/21 2358 01/27/21 0357 01/27/21 0808 01/27/21 1249 01/27/21 1604  GLUCAP 178* 193* 216* 262* 225*    Review of Systems:   Patient is encephalopathic and/or intubated. Therefore history has been obtained from chart review.   Past Medical History:  He,  has a past medical history of ALLERGIC RHINITIS (10/13/2007), ANEMIA, CHRONIC DISEASE NEC (03/31/2007), BENIGN PROSTATIC HYPERTROPHY (03/31/2007), Chest pain, Chronic anemia, DIABETES MELLITUS, TYPE II (03/31/2007), DISC DISEASE, LUMBAR (03/31/2007), ED (erectile dysfunction), Ejection fraction, History of prostatitis, HYPERLIPIDEMIA (03/31/2007), HYPERTENSION (03/31/2007), Nephrolithiasis (12/07/2010), Obesity, POLYP, ANAL AND RECTAL (03/31/2007), Right eye trauma, and Stroke ([place]).   Surgical History:   Past Surgical  History:  Procedure Laterality Date  . [place] SURGERY     1999  . PROSTATE BIOPSY     s/p  . RECTAL POLYPECTOMY     Transanal excision 10/2005  . SKIN BIOPSY     s/p right upper back 2009- benign Dr. Tonia Brooms     Social History:   reports that he quit smoking about 27 years ago. He quit after 10.00 years of use. He has never used smokeless tobacco. He reports current alcohol use of about 1.0 standard drink of alcohol per week. He reports that he does not use drugs.   Family History:  His family history includes Colon polyps in his mother; Stroke in his mother. There is no history of CVA or Seizures.   Allergies No Known Allergies   Home Medications  Prior to Admission medications   Medication Sig Start Date End Date Taking? Authorizing Provider  acetaminophen (TYLENOL) 325 MG tablet Take 2 tablets (650 mg total) by mouth every 4 (four) hours as needed for mild pain or fever (or temp > 37.5 C (99.5 F)). 06/27/18  Yes Emokpae, Courage, MD  amLODipine (NORVASC) 10 MG tablet TAKE 1 TABLET BY MOUTH EVERY DAY Patient taking differently: Take 10 mg by mouth daily. 01/09/21  Yes Fay Records, MD  atorvastatin (LIPITOR) 80 MG tablet TAKE 1 TABLET BY MOUTH EVERY DAY IN THE EVENING AT 6PM Patient taking differently: Take 80 mg by mouth every evening. 10/31/20  Yes [name], MD  Blood Glucose Monitoring Suppl [facility] VERIO) w/Device KIT Use as directed twice daily E11.9 08/11/19  Yes [name], MD  carvedilol (COREG) 12.5 MG tablet Take 1 tablet (12.5 mg total) by mouth 2 (two) times daily with a meal. 11/14/20  Yes [name], MD  ELIQUIS 5 MG TABS tablet TAKE 1 TABLET BY MOUTH TWICE A DAY Patient taking differently: Take 5 mg by mouth 2 (two) times daily. 09/05/20  Yes Fay Records, MD  gabapentin (NEURONTIN) 100 MG capsule TAKE 1 CAPSULE BY MOUTH THREE TIMES A DAY Patient taking differently: Take 100 mg by mouth 3 (three) times daily. 11/10/20  Yes [name], MD  Insulin  Glargine [facility]) 100 UNIT/ML Inject 14 Units into the skin at bedtime. Patient taking differently: Inject 20 Units into the skin at bedtime. 06/09/20  Yes Philemon Kingdom, MD  Insulin Pen Needle 32G X 4 MM MISC Use 1x a day 01/03/21  Yes Philemon Kingdom, MD  KLOR-CON M20 20 MEQ tablet TAKE 1 TABLET (20 MEQ TOTAL) BY MOUTH 2 (TWO) TIMES A WEEK. Patient taking differently: Take 20 mEq by mouth 2 (two) times a week. 02/09/20  Yes Fay Records, MD  Lancets MISC Use as directed twice daily E11.9 08/11/19  Yes [name], MD  losartan (COZAAR) 100 MG tablet TAKE 1 TABLET BY MOUTH EVERY DAY Patient taking differently: Take 100 mg by mouth daily. 09/27/20  Yes [name], MD  metFORMIN (GLUCOPHAGE) 1000 MG tablet Take 1 tablet (1,000 mg total) by mouth 2 (two) times daily with a meal. 01/03/21  Yes Philemon Kingdom, MD  [facility] VERIO test strip USE AS DIRECTED TWICE DAILY E11.9 Patient taking differently: 1 each by Other route as directed. Twice daily. 08/19/20  Yes [name], MD  OVER THE COUNTER MEDICATION Place 1 drop into both eyes daily as needed (dry eyes). Over the counter lubricating eye drop   Yes [provider]  sitaGLIPtin (JANUVIA) 100 MG tablet Take 1 tablet (100 mg total) by mouth daily. 01/03/21  Yes Philemon Kingdom, MD  aspirin EC 81 MG EC tablet Take 1 tablet (81 mg total) by mouth daily. With breakfast Patient not taking: Reported on 01/23/2021 06/28/18   Roxan Hockey, MD     Critical care time: Turner, PA-C [facility] 01/27/2021, 6:02 PM  Please see [email or web address] for pager details.  From 7A-7P if no response, please call [phone]. After hours, please call ELink [phone].

## 2021-01-27 NOTE — Progress Notes (Signed)
Physical Therapy Treatment Patient Details Name: Brian [name] MRN: [id number] DOB: November 23, 1935 Today's Date: 01/27/2021    History of Present Illness Pt is a 85 y.o. M who presents with acute onset R sided weakness, facial droop, numbness, aphasia with left gaze preference. CTH showing L thalamic ICH. Significant PMH: afib, DM2, HLD, HTN, stroke.    PT Comments    The pt was much more alert this session, maintained arousal well both in supine and with sitting EOB compared to previous session. The pt was able to follow ~60% of commands with significantly increased time, but with L sided extremities only. The pt continues to require significant assist of 2 to complete bed mobility and maxA of posterior support to maintain static sitting EOB this session. Slight drop in BP noted with transition to sitting, but stabilized with continued mobility, perturbations, and activity in sitting position. Continue to recommend acute PT and SNF at d/c for continued intervention for strength, stability, and activity tolerance.    Follow Up Recommendations  SNF     Equipment Recommendations  Other (comment) (defer to post acute)    Recommendations for Other Services       Precautions / Restrictions Precautions Precautions: Fall;Other (comment) Precaution Comments: R hemiparesis, global aphasia, cortrak Restrictions Weight Bearing Restrictions: No    Mobility  Bed Mobility Overal bed mobility: Needs Assistance Bed Mobility: Rolling;Sit to Supine;Supine to Sit Rolling: Total assist;+2 for physical assistance   Supine to sit: Total assist;+2 for physical assistance Sit to supine: Total assist;+2 for physical assistance   General bed mobility comments: total (A) sitting and when fully aroused pushing with L UE and total (A) due to posterior bias. Pt requires hand over hand facilitation of R UE for weight bearing    Transfers                 General transfer comment: not tested      Modified Rankin (Stroke Patients Only) Modified Rankin (Stroke Patients Only) Pre-Morbid Rankin Score: Slight disability Modified Rankin: Severe disability     Balance Overall balance assessment: Needs assistance Sitting-balance support: Feet supported Sitting balance-Leahy Scale: Poor Sitting balance - Comments: requires totalA due to posterior lean and limited ability to maintain position despite cues. pushing with LUE towards R Postural control: Posterior lean;Right lateral lean (LOB in all directions)                                  Cognition Arousal/Alertness: Lethargic Behavior During Therapy: Flat affect Overall Cognitive Status: Impaired/Different from baseline Area of Impairment: Following commands                       Following Commands: Follows one step commands inconsistently;Follows one step commands with increased time       General Comments: pt following commands on L side only. pt does hold R hand with L hand when positioned. pt requires 15 second delay to respond to all commands but is still inconsistent with command following ~60% of time with increased time.      Exercises Other Exercises Other Exercises: PROM of R UE for extension of shoulder, elbow wrist and hand    General Comments General comments (skin integrity, edema, etc.): BP with 13 point drop in SBP and 17 point drop in DBP with transition to sitting. pt with increased lethargy, but increased arousal and improvement of BP with perturbations and  movement.      Pertinent Vitals/Pain Pain Assessment: Faces Faces Pain Scale: No hurt Pain Intervention(s): Monitored during session           PT Goals (current goals can now be found in the care plan section) Acute Rehab PT Goals Patient Stated Goal: none stated PT Goal Formulation: Patient unable to participate in goal setting Time For Goal Achievement: 02/07/21 Potential to Achieve Goals: Fair Progress towards PT  goals: Progressing toward goals    Frequency    Min 3X/week      PT Plan Current plan remains appropriate    Co-evaluation PT/OT/SLP Co-Evaluation/Treatment: Yes Reason for Co-Treatment: Complexity of the patient's impairments (multi-system involvement);Necessary to address cognition/behavior during functional activity;For patient/therapist safety;To address functional/ADL transfers PT goals addressed during session: Mobility/safety with mobility;Balance OT goals addressed during session: ADL's and self-care;Proper use of Adaptive equipment and DME;Strengthening/ROM      AM-PAC PT "6 Clicks" Mobility   Outcome Measure  Help needed turning from your back to your side while in a flat bed without using bedrails?: Total Help needed moving from lying on your back to sitting on the side of a flat bed without using bedrails?: Total Help needed moving to and from a bed to a chair (including a wheelchair)?: Total Help needed standing up from a chair using your arms (e.g., wheelchair or bedside chair)?: Total Help needed to walk in hospital room?: Total Help needed climbing 3-5 steps with a railing? : Total 6 Click Score: 6    End of Session   Activity Tolerance: Patient limited by lethargy Patient left: in bed;with call bell/phone within reach;with bed alarm set Nurse Communication: Mobility status PT Visit Diagnosis: Other abnormalities of gait and mobility (R26.89);Hemiplegia and hemiparesis Hemiplegia - Right/Left: Right Hemiplegia - dominant/non-dominant: Dominant Hemiplegia - caused by: Nontraumatic intracerebral hemorrhage     Time: 2952-8413 PT Time Calculation (min) (ACUTE ONLY): 23 min  Charges:  $Therapeutic Activity: 8-22 mins                     Karma Ganja, PT, DPT   Acute Rehabilitation Department Pager #: [phone]   Otho Bellows 01/27/2021, 1:25 PM

## 2021-01-27 NOTE — Progress Notes (Addendum)
Pharmacy Antibiotic Note  Brian [name] is a 85 y.o. male admitted on 01/23/2021 with hypertensive crisis and ICH.  Pharmacy has been consulted for Unasyn dosing for aspiration pneumonia.  WBC 4.49, Tmax 100.1 F; Scr 1.11, CrCrl 60.3 ml/min  Plan: Unasyn 3 gm IV Q 6 hrs Monitor WBC, temp, clinical improvement, renal function  Height: 6' (182.9 cm) Weight: 95.2 kg (209 lb 14.1 oz) IBW/kg (Calculated) : 77.6  Temp (24hrs), Avg:99.2 F (37.3 C), Min:98.4 F (36.9 C), Max:100.1 F (37.8 C)  Recent Labs  Lab 01/23/21 1537 01/23/21 1543 01/26/21 0907 01/27/21 0445 01/27/21 1737  WBC 4.5  --  13.0* 10.6* 8.8  CREATININE 1.22 1.20 1.39* 1.17 1.11    Estimated Creatinine Clearance: 60.3 mL/min (by C-G formula based on SCr of 1.11 mg/dL).    No Known Allergies  Antimicrobials this admission: Unasyn 5/13 >>  Microbiology results: 5/9 MRSA PCR: negative 5/9 COVID: negative  Thank you for allowing pharmacy to be a part of this patient's care.  Gillermina Hu, PharmD, BCPS, [facility] Clinical Pharmacist 01/27/2021 7:11 PM

## 2021-01-27 NOTE — Progress Notes (Addendum)
STROKE TEAM PROGRESS NOTE   INTERVAL HISTORY Low-grade fever persists.  Chest x-Maddon unremarkable.  UA negative for infection.  WBC count is now improved at 10.6.  Patient remains sleepy but can be aroused with some difficulty.  Repeat CT scan of the head done this morning shows stable appearance of thalamic hemorrhage with cytotoxic edema and mild 6 mm left-to-right midline shift.  EEG was also obtained stat which shows focal left-sided slowing and mild irritability but no definite seizures.  No visitors at bedside. He is inattentive to examiners.  Vitals:   01/27/21 1100 01/27/21 1130 01/27/21 1200 01/27/21 1230  BP: (!) 168/76 (!) 152/84 131/77 135/70  Pulse: (!) 101 95 98 95  Resp: (!) 24 20 (!) 27 16  Temp:   99.4 F (37.4 C)   TempSrc:   Oral   SpO2: 97% 99% 98% 96%  Weight:      Height:       CBC:  Recent Labs  Lab 01/23/21 1537 01/23/21 1543 01/26/21 0907 01/27/21 0445  WBC 4.5  --  13.0* 10.6*  NEUTROABS 2.7  --   --  7.9*  HGB 11.6*   < > 11.8* 11.4*  HCT 38.4*   < > 38.5* 37.1*  MCV 88.9  --  84.6 85.1  PLT 185  --  178 165   < > = values in this interval not displayed.   Basic Metabolic Panel:  Recent Labs  Lab 01/26/21 0744 01/26/21 0907 01/26/21 1708 01/27/21 0445  NA  --  134*  --  138  K  --  4.0  --  3.8  CL  --  102  --  105  CO2  --  23  --  24  GLUCOSE  --  332*  --  223*  BUN  --  30*  --  29*  CREATININE  --  1.39*  --  1.17  CALCIUM  --  9.0  --  9.2  MG 1.8  --  1.8  --   PHOS 2.3*  --  3.2  --    Recent Labs  Lab 01/23/21 1542  HGBA1C 5.8*   Lipid panel is pending  IMAGING and DIAGNOSTICS Repeat CT head 5/11 No significant interval change in size and morphology of left thalamic hemorrhage. Surrounding edema has mildly increased from prior, although regional mass effect and localized left-to-right shift is not significantly changed. 2. Intraventricular extension with blood within the lateral and third ventricles, stable. Stable  ventricular size and morphology without hydrocephalus or trapping. 3. No other new acute intracranial abnormality.  CT head: repeat on 5/9 Stable appearing left thalamic hemorrhage with intraventricular component. No new focal abnormality is noted.  MRI Brain 1. Motion degraded exam. 2. 3.0 x 3.1 x 3.4 cm acute intraparenchymal hemorrhage centered at the left thalamus. Surrounding vasogenic edema with mild regional mass effect and 5 mm of left-to-right shift. No visible underlying mass lesion or other structure abnormality on this motion degraded exam. 3. Associated intraventricular extension with blood within the left greater than right lateral ventricles. No hydrocephalus or ventricular trapping at this time. 4. Underlying age-related cerebral atrophy with mild chronic small vessel ischemic disease, with a few small remote right cerebellar infarcts.  MRA HEAD 1. Technically limited exam due to extensive motion artifact. 2. Grossly negative intracranial MRA. No large vessel occlusion, hemodynamically significant stenosis, or other acute vascular abnormality. No vascular abnormality seen underlying the acute left thalamic hemorrhage. Please note that while a MRA  neck was also ordered for this exam, this was unable to be performed due to the patient's inability to tolerate the full length of the study and extensive motion artifact  CT Head Code Stroke 5/9 Acute left thalamic hemorrhage with mild regional mass effect and intraventricular extension. No hydrocephalus. Chronic microvascular ischemic changes. Small chronic right cerebellar infarct.  PHYSICAL EXAM  General:  Mildly obese elderly African-American male lying in bed, somewhat restless, resists exam.  HENT: Normal oropharynx and mucosa. Normal external appearance of ears and nose. Neck: Supple, no pain or tenderness CV: No JVD. No peripheral edema. Pulmonary: Symmetric Chest rise. Normal respiratory effort. Abdomen:  Soft to touch, non-tender. Ext: No cyanosis, edema, or deformity Skin: No rash. Normal palpation of skin.  Musculoskeletal: Normal digits and nails by inspection. No clubbing.  Neurologic Examination  Mental status/Cognition: Keeps eyes closed, Inattentive to examiner. Restless. Does not follow commands.  Globally aphasic Speech/language: No verbal response to verbal or tactile stimuli  Cranial nerves:   CN II Pupils equal and reactive to light, ? Partial R hemianopsia.   CN III,IV,VI L gaze preference, does cross midline.   CN V No response to scratch on the R face.   CN VII R facial droop   CN VIII normal hearing to speech turns towards speech.   CN IX & X    CN XI    CN XII midline tongue protrusion   Motor:  Muscle bulk: normal, tone normal Unable to do full strength testing secondary to aphasia. Moves LUE and LLE spontaneously. Minimal movement in RUE, does withdraw RLE when eliciting planter reflex.  Only trace withdrawal in the right upper extremity.  Tone is increased on the right side.  Gait not tested.  ASSESSMENT/PLAN Zade HYATT CAPOBIANCO is a 85 y.o. male presenting with L Thalamic ICH with some IVH and an ICH score of 2. Unable to provide any history secondary to the stroke but given Eliquis listed on home medications, suspect the ICH is secondary to Eliquis. He is s/p Andexanet alpha for presumed ICH on Eliquis.  ICH with associated cerebral edema in the setting hypertensive emergency and apixaban therapy for paroxysmal atrial fibrillation.  HCT 01/27/21  1. Stable LEFT thalamic hematoma and associated midline shift to the RIGHT of approximately 7 mm. 2. Decreasing intraventricular hemorrhage. 3. No new intracranial abnormality.  Will start 3% saline solution today with relevant protocol    Repeat HCT 5/11 stable hemorrhage  Code Stroke: Acute left thalamic hemorrhage with regional mass  effect and IVH  MRI head: 3.0 x 3.1 x 3.4 cm acute intraparenchymal  hemorrhage  centered at the left thalamus. Surrounding vasogenic edema with mild  regional mass effect and 5 mm of left-to-right shift.  MRA head: Grossly negative, No LVO  MRA neck: unable to complete due to extensive motion  2D Echo: EF 55-60%, +Mildly dilated left atrial size, Grade 1 diastolic  dysfunction. No thrombus, wall motion abnormality or shunt found.   Repeat CT Head 5/1: stable IPH, edema and mass effect.   VTE prophylaxis - PPX  On Eliquis for known Aflutter prior to admission. On hold due to  hemorrhage. No anticoagulant or antiplatelets for now.   Transfer to floor when able to transition away from IV medications for  hypertension with stable BP  Therapy recommendations:  SNF  Disposition:  TBD  Atrial fibrillation  Home medication: Eliquis   Followed by Dr. Harrington Challenger  2019 Echo showed EF 45-50%  now with improved EF 55-60%  Hypertension  Home meds: Norvasc 10, Coreg 12.5, Cozaar 100mg    Unstable, hypertensive emergency on arrival. Requiring cleviprex infusion.  Weaning attempt again discussed with RN to prn IV medications.  Home  medications of Norvasc 10mg  and partial dose home cozaar restarted.   BP goal less than 160.  Hyperlipidemia  Home meds:  Lipitor 80mg   LDL at 55, goal < 70  High intensity statin: at goal with home regimen. Hold statin in setting of  hemorrhage.   Consider continuing statin at discharge  Hyponatremia Na 134, mild.  Monitor- starting 3% saline solution for increased cerebral edema/ shift.   DM2, controlled  Home meds are glargine 14 u hs, metformin 1000 BID, Januvia 100 daily  HgbA1c 5.8, goal < 7.0  CBGs Recent Labs    01/24/21 0345 01/24/21 0816 01/24/21 1200  GLUCAP 151* 130* 127*      SSI  NPO  Dysphagia Feeding  Failed swallow eval 5/11, NPO rec  Cortrack tube placed  Jevity @ 60cc/hr  AKI  Cre 1.22->1.39  Setting of severe hypertension  Follow renal function and urine output  Avoid  nephrotoxins  Bolus and MIVF overnight  Continue MIVF  Fever Leukocytosis   Monitor fever curve  CXR: no acute findings  CBC with leukocytosis wbc 13  UA: rare bacteria,  Urine Culture negative  Chest xray unremarkable.   Consider blood cultures if fever is persistent   Other Stroke Risk Factors  Advanced Age >/= 22   Former Cigarette smoker quit 1995.   Current ETOH use, advised to drink no more than 2       drink(s) a day  Overweight, Body mass index is 28.46 kg/m., BMI >/= 30 associated with increased stroke risk, recommend weight loss, diet and exercise as appropriate   Hx stroke/TIA  Other Active Problems Fever on 01/26/21: Blood negative work-up with chest x-Nike, UA and white count improved on 01/27/21    .  Patient is neurological exam remains unchanged with negative work-up for infection.  CT scan shows persistent cytotoxic edema and mild midline shift hence we will start hypertonic saline through peripheral IV at 75 cc an hour.  Check serum sodium every 6 hourly.  No family available at bedside for discussion.This patient is critically ill and at significant risk of neurological worsening, death and care requires constant monitoring of vital signs, hemodynamics,respiratory and cardiac monitoring, extensive review of multiple databases, frequent neurological assessment, discussion with family, other specialists and medical decision making of high complexity.I have made any additions or clarifications directly to the above note.This critical care time does not reflect procedure time, or teaching time or supervisory time of PA/NP/Med Resident etc but could involve care discussion time.  I spent 30 minutes of neurocritical care time  in the care of  this patient.    Antony Contras MD

## 2021-01-27 NOTE — Progress Notes (Signed)
Inpatient Diabetes Program Recommendations  AACE/ADA: New Consensus Statement on Inpatient Glycemic Control (2015)  Target Ranges:  Prepandial:   less than 140 mg/dL      Peak postprandial:   less than 180 mg/dL (1-2 hours)      Critically ill patients:  140 - 180 mg/dL   Lab Results  Component Value Date   GLUCAP 216 (H) 01/27/2021   HGBA1C 5.8 (H) 01/23/2021    Review of Glycemic Control Results for Brian [name], Brian [name] (MRN [id number]) as of 01/27/2021 11:00  Ref. Range 01/26/2021 07:51 01/26/2021 11:43 01/26/2021 16:02 01/26/2021 19:37 01/26/2021 23:58 01/27/2021 03:57 01/27/2021 08:08  Glucose-Capillary Latest Ref Range: 70 - 99 mg/dL 317 (H)  Novolog 11 units 221 (H)  novolog 10 units 272 (H)  Novolog 14 units 208 (H)  novolog 10 units 178 (H)  novolog 7 units 193 (H)  Novolog 7 units 216 (H)  Novolog 10 units    Diabetes history: DM 2 Outpatient Diabetes medications: Basaglar 20 units qhs, Metformin 1000 mg bid, Januvia 100 mg Daily Current orders for Inpatient glycemic control:  Novolog 0-15 units Q4 hours  Jevity 60 ml/hour  Inpatient Diabetes Program Recommendations:    - increase Tube Feed Coverage to 6 units Q4  Thanks,  Tama Headings RN, MSN, BC-ADM Inpatient Diabetes Coordinator Team Pager [phone] (8a-5p)

## 2021-01-27 NOTE — NC FL2 (Addendum)
[place] MEDICAID FL2 LEVEL OF CARE SCREENING TOOL     IDENTIFICATION  Patient Name: Brian [name] Birthdate: September 05, 1936 Sex: male Admission Date (Current Location): 01/23/2021  [facility] and [place] Number:  Herbalist and Address:  [facility] [place], [place] [address], [place], [place] [address]      Provider Number: [id number]  Attending Physician Name and Address:  Stroke, Md, MD  Relative Name and Phone Number:  [name], son, [phone]    Current Level of Care: Hospital Recommended Level of Care: [place] Prior Approval Number:    Date Approved/Denied:   PASRR Number: [id number]  Discharge Plan: SNF    Current Diagnoses: Patient Active Problem List   Diagnosis Date Noted  . ICH (intracerebral hemorrhage) ([place]) 01/23/2021  . Skin lesion of right leg 11/14/2020  . Uncontrolled type 2 diabetes mellitus with circulatory disorder causing erectile dysfunction ([place]) 11/14/2020  . Bee sting reaction 07/10/2020  . Vitamin D deficiency 07/10/2020  . Right lumbar radiculopathy 10/20/2019  . Insomnia 01/31/2019  . Acute ischemic left ACA stroke ([place]) 06/23/2018  . History of CVA (cerebrovascular accident)   . PAF (paroxysmal atrial fibrillation) ([place])   . Diastolic dysfunction   . Acute CVA (cerebrovascular accident) ([place]) 04/09/2018  . Cerebral embolism with cerebral infarction 04/02/2018  . Right leg pain 12/04/2017  . Grief 12/04/2017  . Poorly controlled type 2 diabetes mellitus with circulatory disorder ([place]) 10/02/2016  . Allergic conjunctivitis 01/12/2016  . Abdominal pain, other specified site 06/23/2012  . Erectile dysfunction 12/13/2011  . Ejection fraction   . Fatigue 06/14/2011  . Encounter for well adult exam with abnormal findings 06/10/2011  . Nephrolithiasis 12/07/2010  . Hypertension, uncontrolled 12/07/2010  . SKIN LESION 03/01/2008  . ALLERGIC RHINITIS 10/13/2007  . Mixed hyperlipidemia 03/31/2007  .  Overweight (BMI 25.0-29.9) 03/31/2007  . ANEMIA, CHRONIC DISEASE NEC 03/31/2007  . POLYP, ANAL AND RECTAL 03/31/2007  . BENIGN PROSTATIC HYPERTROPHY 03/31/2007  . DISC DISEASE, LUMBAR 03/31/2007    Orientation RESPIRATION BLADDER Height & Weight     Self (Can't understand speech to assess)  O2 (2L Nasal cannula) Incontinent,External catheter Weight: 209 lb 14.1 oz (95.2 kg) Height:  6' (182.9 cm)  BEHAVIORAL SYMPTOMS/MOOD NEUROLOGICAL BOWEL NUTRITION STATUS      Continent Diet (Please see DC Summary)  AMBULATORY STATUS COMMUNICATION OF NEEDS Skin   Extensive Assist Verbally Normal                       Personal Care Assistance Level of Assistance  Bathing,Feeding,Dressing Bathing Assistance: Maximum assistance Feeding assistance: Limited assistance Dressing Assistance: Maximum assistance     Functional Limitations Info  Speech     Speech Info: Impaired    SPECIAL CARE FACTORS FREQUENCY  PT (By licensed PT),OT (By licensed OT)     PT Frequency: 5x/week OT Frequency: 5x/week            Contractures Contractures Info: Not present    Additional Factors Info  Code Status,Allergies,Insulin Sliding Scale Code Status Info: Full Allergies Info: NKA   Insulin Sliding Scale Info: See dc summary       Current Medications (01/27/2021):  This is the current hospital active medication list Current Facility-Administered Medications  Medication Dose Route Frequency Provider Last Rate Last Admin  .  stroke: mapping our early stages of recovery book   Does not apply Once Donnetta Simpers, MD      . 0.9 %  sodium chloride infusion   Intravenous Continuous Bailey-Modzik, Delila A, NP 50 mL/hr at 01/27/21 1200 Infusion Verify at 01/27/21 1200  . acetaminophen (TYLENOL) tablet 650 mg  650 mg Oral Q4H PRN Donnetta Simpers, MD       Or  . acetaminophen (TYLENOL) 160 MG/5ML solution 650 mg  650 mg Per Tube Q4H PRN Donnetta Simpers, MD   650 mg at 01/27/21 1027   Or  .  acetaminophen (TYLENOL) suppository 650 mg  650 mg Rectal Q4H PRN Donnetta Simpers, MD   650 mg at 01/25/21 1125  . amLODipine (NORVASC) tablet 10 mg  10 mg Per Tube Daily Bailey-Modzik, Delila A, NP   10 mg at 01/27/21 0902  . chlorhexidine (PERIDEX) 0.12 % solution 15 mL  15 mL Mouth Rinse BID Garvin Fila, MD   15 mL at 01/27/21 0902  . Chlorhexidine Gluconate Cloth 2 % PADS 6 each  6 each Topical Daily Greta Doom, MD   6 each at 01/27/21 0600  . enoxaparin (LOVENOX) injection 40 mg  40 mg Subcutaneous Q24H Garvin Fila, MD   40 mg at 01/27/21 1135  . feeding supplement (JEVITY 1.5 CAL/FIBER) liquid 1,000 mL  1,000 mL Per Tube Continuous Garvin Fila, MD 60 mL/hr at 01/27/21 0648 1,000 mL at 01/27/21 0648  . feeding supplement (PROSource TF) liquid 45 mL  45 mL Per Tube TID Garvin Fila, MD   45 mL at 01/27/21 0902  . gabapentin (NEURONTIN) 250 MG/5ML solution 100 mg  100 mg Per Tube TID Garvin Fila, MD   100 mg at 01/27/21 0902  . hydrALAZINE (APRESOLINE) injection 10 mg  10 mg Intravenous Q4H PRN Bailey-Modzik, Delila A, NP   10 mg at 01/27/21 0807  . insulin aspart (novoLOG) injection 0-20 Units  0-20 Units Subcutaneous Q4H Bailey-Modzik, Delila A, NP   7 Units at 01/27/21 0809  . insulin aspart (novoLOG) injection 5 Units  5 Units Subcutaneous Q4H Dennison Mascot, PA-C      . labetalol (NORMODYNE) injection 10 mg  10 mg Intravenous Q10 min PRN Garvin Fila, MD      . losartan (COZAAR) tablet 50 mg  50 mg Per Tube Daily Bailey-Modzik, Delila A, NP   50 mg at 01/27/21 0902  . MEDLINE mouth rinse  15 mL Mouth Rinse q12n4p Garvin Fila, MD   15 mL at 01/27/21 1128  . pantoprazole (PROTONIX) injection 40 mg  40 mg Intravenous QHS Donnetta Simpers, MD   40 mg at 01/26/21 2121  . senna-docusate (Senokot-S) tablet 1 tablet  1 tablet Per Tube BID Donnetta Simpers, MD   1 tablet at 01/27/21 0902  . sodium chloride flush (NS) 0.9 % injection 3 mL  3 mL  Intravenous Once Noemi Chapel, MD         Discharge Medications: Please see discharge summary for a list of discharge medications.  Relevant Imaging Results:  Relevant Lab Results:   Additional Information SS#: [id number]. Pfizer COVID-19 Vaccine 06/27/2020 , 11/09/2019 , 10/19/2019  Benard Halsted, LCSW  I have personally obtained history,examined this patient, reviewed notes, independently viewed imaging studies, participated in medical decision making and plan of care.ROS completed by me personally and pertinent positives fully documented  I have made any additions or clarifications directly to the above note. Agree with note above.   Antony Contras, MD Medical Director [place] Pager: [phone] 01/27/2021 12:53 PM

## 2021-01-27 NOTE — Procedures (Signed)
Patient Name: Brian [name]  MRN: [id number]  Epilepsy Attending: Lora Havens  Referring Physician/Provider: Dr. Antony Contras Date: 01/27/2021  Duration: 23.10 minutes  Patient history: 85 year old male with left thalamic hemorrhage.  EEG to evaluate for seizures.  Level of alertness: Awake  AEDs during EEG study: Gabapentin  Technical aspects: This EEG study was done with scalp electrodes positioned according to the 10-20 International system of electrode placement. Electrical activity was acquired at a sampling rate of 500Hz  and reviewed with a high frequency filter of 70Hz  and a low frequency filter of 1Hz . EEG data were recorded continuously and digitally stored.   Description: The posterior dominant rhythm consists of 8 Hz activity of moderate voltage (25-35 uV) seen predominantly in posterior head regions, symmetric and reactive to eye opening and eye closing.  EEG showed continuous rhythmic sharply contoured 3 to 5 Hz theta- delta slowing in left frontotemporal region hyperventilation and photic stimulation were not performed.     ABNORMALITY -Continuous slow, left frontotemporal region  IMPRESSION: This study is suggestive of cortical dysfunction in left frontotemporal region likely secondary to underlying bleed. No seizures or definite epileptiform discharges were seen throughout the recording.  Katherene Dinino Barbra Sarks

## 2021-01-28 DIAGNOSIS — R1310 Dysphagia, unspecified: Secondary | ICD-10-CM | POA: Diagnosis not present

## 2021-01-28 DIAGNOSIS — M24521 Contracture, right elbow: Secondary | ICD-10-CM | POA: Diagnosis not present

## 2021-01-28 DIAGNOSIS — R4701 Aphasia: Secondary | ICD-10-CM

## 2021-01-28 DIAGNOSIS — I169 Hypertensive crisis, unspecified: Secondary | ICD-10-CM | POA: Diagnosis not present

## 2021-01-28 DIAGNOSIS — I610 Nontraumatic intracerebral hemorrhage in hemisphere, subcortical: Secondary | ICD-10-CM | POA: Diagnosis not present

## 2021-01-28 LAB — CBC
HCT: 34.8 % — ABNORMAL LOW (ref 39.0–52.0)
Hemoglobin: 10.4 g/dL — ABNORMAL LOW (ref 13.0–17.0)
MCH: 26.1 pg (ref 26.0–34.0)
MCHC: 29.9 g/dL — ABNORMAL LOW (ref 30.0–36.0)
MCV: 87.4 fL (ref 80.0–100.0)
Platelets: 159 10*3/uL (ref 150–400)
RBC: 3.98 MIL/uL — ABNORMAL LOW (ref 4.22–5.81)
RDW: 14.3 % (ref 11.5–15.5)
WBC: 7.8 10*3/uL (ref 4.0–10.5)
nRBC: 0 % (ref 0.0–0.2)

## 2021-01-28 LAB — GLUCOSE, CAPILLARY
Glucose-Capillary: 154 mg/dL — ABNORMAL HIGH (ref 70–99)
Glucose-Capillary: 158 mg/dL — ABNORMAL HIGH (ref 70–99)
Glucose-Capillary: 175 mg/dL — ABNORMAL HIGH (ref 70–99)
Glucose-Capillary: 184 mg/dL — ABNORMAL HIGH (ref 70–99)
Glucose-Capillary: 230 mg/dL — ABNORMAL HIGH (ref 70–99)
Glucose-Capillary: 222 mg/dL — ABNORMAL HIGH (ref 70–99)

## 2021-01-28 LAB — BASIC METABOLIC PANEL
Anion gap: 12 (ref 5–15)
BUN: 30 mg/dL — ABNORMAL HIGH (ref 8–23)
CO2: 19 mmol/L — ABNORMAL LOW (ref 22–32)
Calcium: 9.1 mg/dL (ref 8.9–10.3)
Chloride: 116 mmol/L — ABNORMAL HIGH (ref 98–111)
Creatinine, Ser: 0.98 mg/dL (ref 0.61–1.24)
GFR, Estimated: >60 mL/min (ref >60)
Glucose, Bld: 163 mg/dL — ABNORMAL HIGH (ref 70–99)
Potassium: 4.8 mmol/L (ref 3.5–5.1)
Sodium: 147 mmol/L — ABNORMAL HIGH (ref 135–145)

## 2021-01-28 LAB — LIPID PANEL
Cholesterol: 113 mg/dL (ref 0–200)
HDL: 41 mg/dL (ref >40)
LDL Cholesterol: 64 mg/dL (ref 0–99)
Total CHOL/HDL Ratio: 2.8 RATIO
Triglycerides: 41 mg/dL (ref <150)
VLDL: 8 mg/dL (ref 0–40)

## 2021-01-28 LAB — SODIUM
Sodium: 148 mmol/L — ABNORMAL HIGH (ref 135–145)
Sodium: 150 mmol/L — ABNORMAL HIGH (ref 135–145)

## 2021-01-28 MED ORDER — METOPROLOL TARTRATE 50 MG PO TABS: 50.0000 mg | ORAL_TABLET | Freq: Two times a day (BID) | ORAL | Status: DC

## 2021-01-28 MED ORDER — AMIODARONE HCL IN DEXTROSE 360-4.14 MG/200ML-% IV SOLN
60.0000 mg/h | INTRAVENOUS | Status: DC
  Administered 2021-01-28 (×2): 60 mg/h via INTRAVENOUS
  Filled 2021-01-28: qty 200

## 2021-01-28 MED ORDER — SODIUM CHLORIDE 3 % IV SOLN
INTRAVENOUS | Status: AC
  Filled 2021-01-28: qty 500

## 2021-01-28 MED ORDER — PANTOPRAZOLE SODIUM 40 MG PO PACK
40.0000 mg | PACK | Freq: Every day | ORAL | Status: DC
  Administered 2021-01-28 – 2021-02-27 (×31): 40 mg
  Filled 2021-01-28 (×32): qty 20

## 2021-01-28 MED ORDER — AMIODARONE HCL IN DEXTROSE 360-4.14 MG/200ML-% IV SOLN
INTRAVENOUS | Status: AC
  Filled 2021-01-28: qty 200

## 2021-01-28 MED ORDER — METOPROLOL TARTRATE 25 MG PO TABS: 25.0000 mg | ORAL_TABLET | Freq: Once | ORAL | Status: DC

## 2021-01-28 MED ORDER — AMIODARONE LOAD VIA INFUSION: 150.0000 mg | Freq: Once | INTRAVENOUS | Status: DC

## 2021-01-28 MED ORDER — CARVEDILOL 3.125 MG PO TABS: 3.1250 mg | ORAL_TABLET | Freq: Two times a day (BID) | ORAL | Status: DC

## 2021-01-28 MED ORDER — CARVEDILOL 3.125 MG PO TABS
3.1250 mg | ORAL_TABLET | Freq: Two times a day (BID) | ORAL | Status: DC
  Administered 2021-01-28 – 2021-01-29 (×3): 3.125 mg
  Filled 2021-01-28 (×3): qty 1

## 2021-01-28 MED ORDER — AMIODARONE HCL IN DEXTROSE 360-4.14 MG/200ML-% IV SOLN
30.0000 mg/h | INTRAVENOUS | Status: DC
  Administered 2021-01-29 – 2021-01-31 (×5): 30 mg/h via INTRAVENOUS
  Filled 2021-01-28 (×5): qty 200

## 2021-01-28 NOTE — Progress Notes (Signed)
Orthopedic Tech Progress Note Patient Details:  Brian [name] 1936/02/01 [id number] Called in order to [facility] Patient ID: [name], male   DOB: 12/14/1935, 85 y.o.   MRN: [id number]   Chip Boer 01/28/2021, 8:55 AM

## 2021-01-28 NOTE — Progress Notes (Signed)
NAME:  Brian [name], MRN:  [id number], DOB:  09-Jan-1936, LOS: 5 ADMISSION DATE:  01/23/2021, CONSULTATION DATE: 5/13 REFERRING MD:  Dr. Stroke, CHIEF COMPLAINT: ICH  History of Present Illness:   Patient is 85 yo AA male admitted to [facility] on 01/23/2021 for acute onset right sided weakness and language deficit. Pertinent PMH of HTN, HLD, EF 45-50% on 2019, T2DM, Chronic anemia, Afib on Eliquis at home, prior L MCa Stroke. CTH w/o contrast and MRI on 5/9 showing L thalamic ICH with cytoxic edema/with mild 40mm left to right shift. Patient was anticoagulated with Apixaban for A-fib which was reversed in ED with Andexanet Alpha. Also requiring Cleviprex infusion for hypertensive emergency. Admitted to Neuro ICU under the stroke service.   Course complicated with ongoing HTN requiring Cleviprex infusion. Mentation initially somewhat improving. Patient developed fever with increased lethargy on 5/12. Failed swallow eval and core-track was placed. Cleviprex stopped on 5/12 and started on oral anti hypertensives. On 5/13 low grade fever persist with downtrending WBC. EEG shows focal left-sided slowing but no focal seizures. Hypertonic saline started on 5/13. Patient developed HR in the 150-160s and PCCM consulted.   Pertinent  Medical History  Stroke  Obesity HTN HLD Type 2 diabetes  Chronic anemia A-fib with chronic anticoagulation  Significant Hospital Events: Including procedures, antibiotic start and stop dates in addition to other pertinent events   . 5/9- admitted for ICH.  [name] 5/9- CT head w/o: acute left thalamic hemorrhage with mild mass effect. . 5/9 MR brain w/o: 3.0 x 3.1 x 3.4 cm acute intraparenchymal hemorrhage centered at the left thalamus. Surrounding vasogenic edema with mild regional mass effect and 5 mm of left-to-right shift. . 5/11 CT Head w/o: No significant change. Surrounding edema mildly increased.  . 5/13 Increasing lethargy. Repeat CT showed increasing midline shift.  Hypertonic saline initiated.  . 5/14 developed atrial fibrillation with rapid ventricular rate with a rate of 1 50-1 60, IV amiodarone drip started  Interim History / Subjective:  No acute events overnight  Objective   Blood pressure (!) 159/67, pulse 85, temperature 98.4 F (36.9 C), temperature source Axillary, resp. rate 17, height 6' (1.829 m), weight 101.5 kg, SpO2 93 %.        Intake/Output Summary (Last 24 hours) at 01/28/2021 1039 Last data filed at 01/28/2021 0800 Gross per 24 hour  Intake 2090.94 ml  Output 810 ml  Net 1280.94 ml   Filed Weights   01/23/21 2245 01/26/21 0500 01/28/21 0400  Weight: 97.8 kg 95.2 kg 101.5 kg    Examination: General: Chronically ill appearing elderly male lying in bed in NAD HEENT: [place]/AT, MM pink/moist, PERRL, core track in place Neuro: Alert and interactive but aphasic CV: s1s2 regular rate and rhythm, no murmur, rubs, or gallops,  PULM: Clear to auscultation bilaterally, no increased work of breathing, oxygen saturations appropriate on room air GI: soft, bowel sounds active in all 4 quadrants, non-tender, non-distended, tolerating TF Extremities: warm/dry, no edema  Skin: no rashes or lesions  Labs/imaging that I have personally reviewed   Imaging as above, cbc, cmp  Resolved Hospital Problem list   AKI  Assessment & Plan:   ICH -Left thalamic ICH with 7 mm midline shift Hypertensive crisis P:: Management per neurology/stroke Remains on hypertonic saline SBP goal less than 160 Continue amlodipine, losartan, Metroprolol.  Amiodarone started as below Maintain neuro protective measures; goal for eurothermia, euglycemia, eunatermia, normoxia, and PCO2 goal of 35-40 Nutrition and bowel regiment  Aspirations  precautions   -Management per stroke service -Hypertonic saline started at 75 ml/hr -BP management for Systolic <948 -Amlodipine, Losartan, Metoprolol scheduled -PRN labetolol and hydralazine -continue statin -Continue  to hold Eliquis   SVT -Developed 5/14 HR in 170s with narrow complex when arrived to bedside. Treated with 6 mg, then 12 mg of Adenosine.  -Patient again seen with SVT versus A. fib RVR on 5/14 with a rate of 160 History of Atrial Fibrillation -Chronically anticoagulated on Eliquis, this was reversed on arrival  P: Hold anticoagulation Continue scheduled Metroprolol Start IV amiodarone drip (converted quickly with IV drip) Optimize electrolytes Continuous telemetry  Possible aspiration pneumonia P: Remains on IV Unasyn, can likely de-escalate soon Sputum culture pending Trend CBC and fever curve Continue tube feeds via core track Aspiration precautions  DMT2 P: Continue SSI  Dysphagia P: Continue tube feeds via core track Aspiration precautions SLP following   Best practice   Diet:  Tube Feed  Pain/Anxiety/Delirium protocol (if indicated): No VAP protocol (if indicated): Not indicated DVT prophylaxis: Contraindicated GI prophylaxis: PPI Glucose control:  SSI Yes Central venous access:  N/A Arterial line:  N/A Foley:  N/A Mobility:  bed rest  PT consulted: Yes Last date of multidisciplinary goals of care discussion Per primary  Code Status:  full code Disposition: ICU    Critical care time:   Performed by: Johnsie Cancel  Total critical care time: 34 minutes  Critical care time was exclusive of separately billable procedures and treating other patients.  Critical care was necessary to treat or prevent imminent or life-threatening deterioration.  Critical care was time spent personally by me on the following activities: development of treatment plan with patient and/or surrogate as well as nursing, discussions with consultants, evaluation of patient's response to treatment, examination of patient, obtaining history from patient or surrogate, ordering and performing treatments and interventions, ordering and review of laboratory studies, ordering and review  of radiographic studies, pulse oximetry and re-evaluation of patient's condition.  Johnsie Cancel, NP-C [facility] Personal contact information can be found on Amion  01/28/2021, 10:55 AM

## 2021-01-28 NOTE — Progress Notes (Addendum)
STROKE TEAM PROGRESS NOTE   SUBJECTIVE (INTERVAL HISTORY) No family but Dr. Carlis Abbott is at the bedside. Pt was put on amiodarone drip overnight, this morning had 65min of SVT. Now back to sinus. TF restarted this am. He was on 3% saline yesterday but CT stable, now pt neuro stable, awake alert still has global aphasia and right hemiplegia, will taper off 3%.    OBJECTIVE Temp:  [98.4 F (36.9 C)-99.8 F (37.7 C)] 98.4 F (36.9 C) (05/14 0800) Pulse Rate:  [73-171] 90 (05/14 1035) Cardiac Rhythm: Normal sinus rhythm (05/14 0800) Resp:  [12-30] 20 (05/14 1035) BP: (89-170)/(49-112) 145/78 (05/14 1035) SpO2:  [93 %-100 %] 97 % (05/14 1035) Weight:  [101.5 kg] 101.5 kg (05/14 0400)  Recent Labs  Lab 01/27/21 1604 01/27/21 1932 01/27/21 2328 01/28/21 0337 01/28/21 0835  GLUCAP 225* 148* 158* 154* 158*   Recent Labs  Lab 01/23/21 1537 01/23/21 1543 01/25/21 1645 01/26/21 0744 01/26/21 0907 01/26/21 1708 01/27/21 0445 01/27/21 1425 01/27/21 1737 01/27/21 2016 01/28/21 0439  NA 139 140  --   --  134*  --  138 138 142 143 147*  K 4.7 4.7  --   --  4.0  --  3.8  --  3.9  --  4.8  CL 104 103  --   --  102  --  105  --  110  --  116*  CO2 27  --   --   --  23  --  24  --  21*  --  19*  GLUCOSE 151* 146*  --   --  332*  --  223*  --  216*  --  163*  BUN 16 20  --   --  30*  --  29*  --  34*  --  30*  CREATININE 1.22 1.20  --   --  1.39*  --  1.17  --  1.11  --  0.98  CALCIUM 9.4  --   --   --  9.0  --  9.2  --  9.1  --  9.1  MG  --   --  1.7 1.8  --  1.8  --   --  2.0  --   --   PHOS  --   --  3.2 2.3*  --  3.2  --   --   --   --   --    Recent Labs  Lab 01/23/21 1537 01/27/21 1737  AST 23 34  ALT 21 21  ALKPHOS 55 52  BILITOT 0.8 1.1  PROT 6.7 6.7  ALBUMIN 4.0 3.3*   Recent Labs  Lab 01/23/21 1537 01/23/21 1543 01/26/21 0907 01/27/21 0445 01/27/21 1737 01/28/21 0439  WBC 4.5  --  13.0* 10.6* 8.8 7.8  NEUTROABS 2.7  --   --  7.9*  --   --   HGB 11.6* 12.2* 11.8*  11.4* 11.7* 10.4*  HCT 38.4* 36.0* 38.5* 37.1* 38.3* 34.8*  MCV 88.9  --  84.6 85.1 85.3 87.4  PLT 185  --  178 165 169 159   No results for input(s): CKTOTAL, CKMB, CKMBINDEX, TROPONINI in the last 168 hours. No results for input(s): LABPROT, INR in the last 72 hours. Recent Labs    01/26/21 1054  COLORURINE AMBER*  LABSPEC 1.026  PHURINE 5.0  GLUCOSEU 150*  HGBUR NEGATIVE  BILIRUBINUR NEGATIVE  KETONESUR NEGATIVE  PROTEINUR 30*  NITRITE NEGATIVE  LEUKOCYTESUR NEGATIVE  Component Value Date/Time   CHOL 113 01/28/2021 0439   CHOL 141 10/09/2018 1328   TRIG 41 01/28/2021 0439   HDL 41 01/28/2021 0439   HDL 57 10/09/2018 1328   CHOLHDL 2.8 01/28/2021 0439   VLDL 8 01/28/2021 0439   LDLCALC 64 01/28/2021 0439   LDLCALC 69 10/09/2018 1328   Lab Results  Component Value Date   HGBA1C 5.8 (H) 01/23/2021      Component Value Date/Time   LABOPIA NONE DETECTED 04/01/2018 1148   COCAINSCRNUR NONE DETECTED 04/01/2018 1148   LABBENZ NONE DETECTED 04/01/2018 1148   AMPHETMU NONE DETECTED 04/01/2018 1148   THCU NONE DETECTED 04/01/2018 1148   LABBARB (A) 04/01/2018 1148    Result not available. Reagent lot number recalled by manufacturer.    No results for input(s): ETH in the last 168 hours.  I have personally reviewed the radiological images below and agree with the radiology interpretations.  CT Head Wo Contrast  Result Date: 01/27/2021 CLINICAL DATA:  Follow-up hemorrhagic stroke involving the LEFT thalamus with extension into the ventricular system. EXAM: CT HEAD WITHOUT CONTRAST TECHNIQUE: Contiguous axial images were obtained from the base of the skull through the vertex without intravenous contrast. COMPARISON:  01/25/2021 and earlier. FINDINGS: Brain: Hematoma involving the LEFT thalamus measuring approximately 2.6 x 2.8 x 2.8 cm, unchanged in size since the initial 01/23/2021 CT. Interval decrease in the amount of intraventricular hemorrhage in the lateral  ventricles and the third ventricle. Mass-effect with midline shift of approximately 7 mm to the RIGHT, unchanged. No new hemorrhage or hematoma. No extra-axial fluid collections. Vascular: Mild BILATERAL carotid siphon and vertebral artery atherosclerosis. Hyperdense vessel. Skull: No skull fracture or other focal osseous abnormality involving the skull. Sinuses/Orbits: Paranasal sinuses, mastoid air cells and middle ear cavities well-aerated. Benign senile scleral calcifications in both eyes. Other: None. IMPRESSION: 1. Stable LEFT thalamic hematoma and associated midline shift to the RIGHT of approximately 7 mm. 2. Decreasing intraventricular hemorrhage. 3. No new intracranial abnormality. Electronically Signed   By: Evangeline Dakin M.D.   On: 01/27/2021 10:55   CT HEAD WO CONTRAST  Result Date: 01/25/2021 CLINICAL DATA:  Follow-up examination for acute stroke. EXAM: CT HEAD WITHOUT CONTRAST TECHNIQUE: Contiguous axial images were obtained from the base of the skull through the vertex without intravenous contrast. COMPARISON:  Prior CT from 01/23/2021. FINDINGS: Brain: Intraparenchymal hemorrhage centered at the left thalamus is not significantly changed in size or morphology as compared to previous exam. Surrounding edema is stable to mildly increased. Similar regional mass effect with localized left-to-right shift. Intraventricular extension with blood within the lateral and third ventricles, relatively stable. Stable ventricular size and morphology without hydrocephalus or trapping. Basilar cisterns remain patent. No new intracranial hemorrhage or large vessel territory infarct. No extra-axial collection. Underlying atrophy with chronic microvascular ischemic disease again noted. Small remote right cerebellar infarcts noted as well. Vascular: No hyperdense vessel. Scattered vascular calcifications noted within the carotid siphons. Skull: Scalp soft tissues and calvarium demonstrate no acute finding.  Sinuses/Orbits: Globes and orbital soft tissues demonstrate no acute finding. Mild scattered mucosal thickening noted within the ethmoidal air cells and maxillary sinuses. Paranasal sinuses are otherwise clear. Mastoid air cells remain largely clear. Other: None. IMPRESSION: 1. No significant interval change in size and morphology of left thalamic hemorrhage. Surrounding edema has mildly increased from prior, although regional mass effect and localized left-to-right shift is not significantly changed. 2. Intraventricular extension with blood within the lateral and third ventricles, stable. Stable  ventricular size and morphology without hydrocephalus or trapping. 3. No other new acute intracranial abnormality. Electronically Signed   By: Jeannine Boga M.D.   On: 01/25/2021 04:19   CT HEAD WO CONTRAST  Result Date: 01/23/2021 CLINICAL DATA:  Follow-up intracranial hemorrhage EXAM: CT HEAD WITHOUT CONTRAST TECHNIQUE: Contiguous axial images were obtained from the base of the skull through the vertex without intravenous contrast. COMPARISON:  CT from earlier in the same day. FINDINGS: Brain: Parenchymal hemorrhage is again noted in the region of the left thalamus. Some surrounding edema is noted. The hematoma measures approximately 3.0 x 2.4 cm and roughly stable from the prior exam. Some intraventricular component is again noted particularly in the left lateral ventricle. Chronic white matter ischemic changes are noted. No new focal hemorrhage is seen. Chronic right cerebellar infarct is noted. Vascular: No hyperdense vessel or unexpected calcification. Skull: Normal. Negative for fracture or focal lesion. Sinuses/Orbits: No acute finding. Other: None. IMPRESSION: Stable appearing left thalamic hemorrhage with intraventricular component. No new focal abnormality is noted. Electronically Signed   By: Inez Catalina M.D.   On: 01/23/2021 21:54   MR MRA HEAD WO CONTRAST  Result Date: 01/23/2021 CLINICAL DATA:   Initial evaluation for acute intracranial hemorrhage, headache. EXAM: MRI HEAD WITHOUT CONTRAST MRA HEAD WITHOUT CONTRAST TECHNIQUE: Multiplanar, multi-echo pulse sequences of the brain and surrounding structures were acquired without intravenous contrast. Angiographic images of the Circle of Willis were acquired using MRA technique without intravenous contrast. COMPARISON: No pertinent prior exam. COMPARISON:  Prior CT from earlier the same day as well as previous MRI from 06/23/2018. FINDINGS: MRI HEAD FINDINGS Brain: Examination moderately to severely degraded by motion artifact. Generalized age-related cerebral atrophy. Patchy and confluent T2/FLAIR hyperintensity within the periventricular and deep white matter both cerebral hemispheres most consistent with chronic small vessel ischemic disease. Few small remote right cerebellar infarcts noted. Previously identified acute intraparenchymal hemorrhage centered at the left thalamus again seen. Bleed measures 3.0 x 3.1 x 3.4 cm on this motion degraded exam (estimated volume 16 cc). No visible underlying mass lesion or other structure abnormality on this motion degraded noncontrast examination. Surrounding vasogenic edema with mild regional mass effect. Associated mild localized left-to-right shift measures up to 5 mm at the septum pellucidum. Associated intraventricular extension with blood within the left greater than right lateral ventricles. No hydrocephalus or ventricular trapping at this time. No other evidence for acute intracranial hemorrhage. No other foci of susceptibility artifact to suggest chronic hemorrhage elsewhere within the brain. No other foci of restricted diffusion to suggest acute or subacute ischemia. Gray-white matter differentiation otherwise maintained. No mass lesion or mass effect elsewhere within the brain. No extra-axial fluid collection. Pituitary gland suprasellar region within normal limits. Vascular: Major intracranial vascular flow  voids are maintained. Skull and upper cervical spine: Craniocervical junction within normal limits. Bone marrow signal intensity normal. No scalp soft tissue abnormality. Sinuses/Orbits: Patient status post ocular lens replacement on the right. Globes and orbital soft tissues demonstrate no acute finding. Paranasal sinuses are largely clear. Trace bilateral mastoid effusions, of doubtful significance. Inner ear structures grossly normal. Other: None. MRA HEAD FINDINGS ANTERIOR CIRCULATION: Examination degraded by motion artifact. Both internal carotid arteries patent to the termini without stenosis or other appreciable abnormality. A1 segments patent. Anterior communicating artery complex grossly within normal limits. Anterior cerebral arteries grossly patent to their distal aspects without appreciable stenosis. No obvious M1 stenosis. Grossly normal MCA bifurcations. Distal MCA branches well perfused and symmetric. POSTERIOR CIRCULATION: Both  V4 segments patent to the vertebrobasilar junction without stenosis. Left vertebral artery dominant. Left PICA origin patent and normal. Right PICA origin not definitely seen. Basilar patent to its distal aspect without stenosis. Superior cerebellar arteries patent bilaterally. Both PCAs primarily supplied via the basilar. PCAs perfused to their distal aspects without appreciable stenosis. No intracranial aneurysm. No vascular abnormality seen underlying the acute left thalamic hemorrhage. IMPRESSION: MRI HEAD IMPRESSION: 1. Motion degraded exam. 2. 3.0 x 3.1 x 3.4 cm acute intraparenchymal hemorrhage centered at the left thalamus. Surrounding vasogenic edema with mild regional mass effect and 5 mm of left-to-right shift. No visible underlying mass lesion or other structure abnormality on this motion degraded exam. 3. Associated intraventricular extension with blood within the left greater than right lateral ventricles. No hydrocephalus or ventricular trapping at this time. 4.  Underlying age-related cerebral atrophy with mild chronic small vessel ischemic disease, with a few small remote right cerebellar infarcts. MRA HEAD IMPRESSION: 1. Technically limited exam due to extensive motion artifact. 2. Grossly negative intracranial MRA. No large vessel occlusion, hemodynamically significant stenosis, or other acute vascular abnormality. No vascular abnormality seen underlying the acute left thalamic hemorrhage. Please note that while a MRA neck was also ordered for this exam, this was unable to be performed due to the patient's inability to tolerate the full length of the study and extensive motion artifact. Electronically Signed   By: Jeannine Boga M.D.   On: 01/23/2021 21:41   MR ANGIO NECK W WO CONTRAST  Result Date: 01/23/2021 CLINICAL DATA:  Initial evaluation for acute intracranial hemorrhage, headache. EXAM: MRI HEAD WITHOUT CONTRAST MRA HEAD WITHOUT CONTRAST TECHNIQUE: Multiplanar, multi-echo pulse sequences of the brain and surrounding structures were acquired without intravenous contrast. Angiographic images of the Circle of Willis were acquired using MRA technique without intravenous contrast. COMPARISON: No pertinent prior exam. COMPARISON:  Prior CT from earlier the same day as well as previous MRI from 06/23/2018. FINDINGS: MRI HEAD FINDINGS Brain: Examination moderately to severely degraded by motion artifact. Generalized age-related cerebral atrophy. Patchy and confluent T2/FLAIR hyperintensity within the periventricular and deep white matter both cerebral hemispheres most consistent with chronic small vessel ischemic disease. Few small remote right cerebellar infarcts noted. Previously identified acute intraparenchymal hemorrhage centered at the left thalamus again seen. Bleed measures 3.0 x 3.1 x 3.4 cm on this motion degraded exam (estimated volume 16 cc). No visible underlying mass lesion or other structure abnormality on this motion degraded noncontrast  examination. Surrounding vasogenic edema with mild regional mass effect. Associated mild localized left-to-right shift measures up to 5 mm at the septum pellucidum. Associated intraventricular extension with blood within the left greater than right lateral ventricles. No hydrocephalus or ventricular trapping at this time. No other evidence for acute intracranial hemorrhage. No other foci of susceptibility artifact to suggest chronic hemorrhage elsewhere within the brain. No other foci of restricted diffusion to suggest acute or subacute ischemia. Gray-white matter differentiation otherwise maintained. No mass lesion or mass effect elsewhere within the brain. No extra-axial fluid collection. Pituitary gland suprasellar region within normal limits. Vascular: Major intracranial vascular flow voids are maintained. Skull and upper cervical spine: Craniocervical junction within normal limits. Bone marrow signal intensity normal. No scalp soft tissue abnormality. Sinuses/Orbits: Patient status post ocular lens replacement on the right. Globes and orbital soft tissues demonstrate no acute finding. Paranasal sinuses are largely clear. Trace bilateral mastoid effusions, of doubtful significance. Inner ear structures grossly normal. Other: None. MRA HEAD FINDINGS ANTERIOR CIRCULATION: Examination degraded  by motion artifact. Both internal carotid arteries patent to the termini without stenosis or other appreciable abnormality. A1 segments patent. Anterior communicating artery complex grossly within normal limits. Anterior cerebral arteries grossly patent to their distal aspects without appreciable stenosis. No obvious M1 stenosis. Grossly normal MCA bifurcations. Distal MCA branches well perfused and symmetric. POSTERIOR CIRCULATION: Both V4 segments patent to the vertebrobasilar junction without stenosis. Left vertebral artery dominant. Left PICA origin patent and normal. Right PICA origin not definitely seen. Basilar patent  to its distal aspect without stenosis. Superior cerebellar arteries patent bilaterally. Both PCAs primarily supplied via the basilar. PCAs perfused to their distal aspects without appreciable stenosis. No intracranial aneurysm. No vascular abnormality seen underlying the acute left thalamic hemorrhage. IMPRESSION: MRI HEAD IMPRESSION: 1. Motion degraded exam. 2. 3.0 x 3.1 x 3.4 cm acute intraparenchymal hemorrhage centered at the left thalamus. Surrounding vasogenic edema with mild regional mass effect and 5 mm of left-to-right shift. No visible underlying mass lesion or other structure abnormality on this motion degraded exam. 3. Associated intraventricular extension with blood within the left greater than right lateral ventricles. No hydrocephalus or ventricular trapping at this time. 4. Underlying age-related cerebral atrophy with mild chronic small vessel ischemic disease, with a few small remote right cerebellar infarcts. MRA HEAD IMPRESSION: 1. Technically limited exam due to extensive motion artifact. 2. Grossly negative intracranial MRA. No large vessel occlusion, hemodynamically significant stenosis, or other acute vascular abnormality. No vascular abnormality seen underlying the acute left thalamic hemorrhage. Please note that while a MRA neck was also ordered for this exam, this was unable to be performed due to the patient's inability to tolerate the full length of the study and extensive motion artifact. Electronically Signed   By: Jeannine Boga M.D.   On: 01/23/2021 21:41   MR BRAIN WO CONTRAST  Result Date: 01/23/2021 CLINICAL DATA:  Initial evaluation for acute intracranial hemorrhage, headache. EXAM: MRI HEAD WITHOUT CONTRAST MRA HEAD WITHOUT CONTRAST TECHNIQUE: Multiplanar, multi-echo pulse sequences of the brain and surrounding structures were acquired without intravenous contrast. Angiographic images of the Circle of Willis were acquired using MRA technique without intravenous contrast.  COMPARISON: No pertinent prior exam. COMPARISON:  Prior CT from earlier the same day as well as previous MRI from 06/23/2018. FINDINGS: MRI HEAD FINDINGS Brain: Examination moderately to severely degraded by motion artifact. Generalized age-related cerebral atrophy. Patchy and confluent T2/FLAIR hyperintensity within the periventricular and deep white matter both cerebral hemispheres most consistent with chronic small vessel ischemic disease. Few small remote right cerebellar infarcts noted. Previously identified acute intraparenchymal hemorrhage centered at the left thalamus again seen. Bleed measures 3.0 x 3.1 x 3.4 cm on this motion degraded exam (estimated volume 16 cc). No visible underlying mass lesion or other structure abnormality on this motion degraded noncontrast examination. Surrounding vasogenic edema with mild regional mass effect. Associated mild localized left-to-right shift measures up to 5 mm at the septum pellucidum. Associated intraventricular extension with blood within the left greater than right lateral ventricles. No hydrocephalus or ventricular trapping at this time. No other evidence for acute intracranial hemorrhage. No other foci of susceptibility artifact to suggest chronic hemorrhage elsewhere within the brain. No other foci of restricted diffusion to suggest acute or subacute ischemia. Gray-white matter differentiation otherwise maintained. No mass lesion or mass effect elsewhere within the brain. No extra-axial fluid collection. Pituitary gland suprasellar region within normal limits. Vascular: Major intracranial vascular flow voids are maintained. Skull and upper cervical spine: Craniocervical junction within normal  limits. Bone marrow signal intensity normal. No scalp soft tissue abnormality. Sinuses/Orbits: Patient status post ocular lens replacement on the right. Globes and orbital soft tissues demonstrate no acute finding. Paranasal sinuses are largely clear. Trace bilateral  mastoid effusions, of doubtful significance. Inner ear structures grossly normal. Other: None. MRA HEAD FINDINGS ANTERIOR CIRCULATION: Examination degraded by motion artifact. Both internal carotid arteries patent to the termini without stenosis or other appreciable abnormality. A1 segments patent. Anterior communicating artery complex grossly within normal limits. Anterior cerebral arteries grossly patent to their distal aspects without appreciable stenosis. No obvious M1 stenosis. Grossly normal MCA bifurcations. Distal MCA branches well perfused and symmetric. POSTERIOR CIRCULATION: Both V4 segments patent to the vertebrobasilar junction without stenosis. Left vertebral artery dominant. Left PICA origin patent and normal. Right PICA origin not definitely seen. Basilar patent to its distal aspect without stenosis. Superior cerebellar arteries patent bilaterally. Both PCAs primarily supplied via the basilar. PCAs perfused to their distal aspects without appreciable stenosis. No intracranial aneurysm. No vascular abnormality seen underlying the acute left thalamic hemorrhage. IMPRESSION: MRI HEAD IMPRESSION: 1. Motion degraded exam. 2. 3.0 x 3.1 x 3.4 cm acute intraparenchymal hemorrhage centered at the left thalamus. Surrounding vasogenic edema with mild regional mass effect and 5 mm of left-to-right shift. No visible underlying mass lesion or other structure abnormality on this motion degraded exam. 3. Associated intraventricular extension with blood within the left greater than right lateral ventricles. No hydrocephalus or ventricular trapping at this time. 4. Underlying age-related cerebral atrophy with mild chronic small vessel ischemic disease, with a few small remote right cerebellar infarcts. MRA HEAD IMPRESSION: 1. Technically limited exam due to extensive motion artifact. 2. Grossly negative intracranial MRA. No large vessel occlusion, hemodynamically significant stenosis, or other acute vascular  abnormality. No vascular abnormality seen underlying the acute left thalamic hemorrhage. Please note that while a MRA neck was also ordered for this exam, this was unable to be performed due to the patient's inability to tolerate the full length of the study and extensive motion artifact. Electronically Signed   By: Jeannine Boga M.D.   On: 01/23/2021 21:41   DG Chest Port 1 View  Result Date: 01/27/2021 CLINICAL DATA:  Code stroke EXAM: PORTABLE CHEST 1 VIEW COMPARISON:  01/26/2021 FINDINGS: Esophageal tube tip coiled within the proximal stomach. No focal opacity or pleural effusion. Stable cardiomediastinal silhouette with borderline cardiomegaly. No pneumothorax. IMPRESSION: No active disease. Electronically Signed   By: Donavan Foil M.D.   On: 01/27/2021 18:38   DG Chest Port 1 View  Result Date: 01/26/2021 CLINICAL DATA:  Fever, code stroke EXAM: PORTABLE CHEST 1 VIEW COMPARISON:  Radiograph 08/13/2011 FINDINGS: Unchanged cardiomediastinal silhouette. There is no focal airspace disease. There is no pleural effusion or visible pneumothorax. There is no acute osseous abnormality. There is a at weighted tip enteric tube overlying the stomach. IMPRESSION: No evidence of acute cardiopulmonary disease. Electronically Signed   By: Maurine Simmering   On: 01/26/2021 10:06   EEG adult  Result Date: 01/27/2021 Lora Havens, MD     01/27/2021 11:40 AM Patient Name: Brian [name] MRN: [id number] Epilepsy Attending: Lora Havens Referring Physician/Provider: Dr. Antony Contras Date: 01/27/2021 Duration: 23.10 minutes Patient history: 85 year old male with left thalamic hemorrhage.  EEG to evaluate for seizures. Level of alertness: Awake AEDs during EEG study: Gabapentin Technical aspects: This EEG study was done with scalp electrodes positioned according to the 10-20 International system of electrode placement. Electrical activity was acquired at  a sampling rate of 500Hz  and reviewed with a high frequency  filter of 70Hz  and a low frequency filter of 1Hz . EEG data were recorded continuously and digitally stored. Description: The posterior dominant rhythm consists of 8 Hz activity of moderate voltage (25-35 uV) seen predominantly in posterior head regions, symmetric and reactive to eye opening and eye closing.  EEG showed continuous rhythmic sharply contoured 3 to 5 Hz theta- delta slowing in left frontotemporal region hyperventilation and photic stimulation were not performed.   ABNORMALITY -Continuous slow, left frontotemporal region IMPRESSION: This study is suggestive of cortical dysfunction in left frontotemporal region likely secondary to underlying bleed. No seizures or definite epileptiform discharges were seen throughout the recording. Lora Havens   ECHOCARDIOGRAM COMPLETE  Result Date: 01/24/2021    ECHOCARDIOGRAM REPORT   Patient Name:   DAUNTAE POEPPING Date of Exam: 01/24/2021 Medical Rec #:  [id number]     Height:       72.0 in Accession #:    [id number]    Weight:       215.6 lb Date of Birth:  08-20-1936     BSA:          2.200 m Patient Age:    66 years      BP:           143/92 mmHg Patient Gender: M             HR:           77 bpm. Exam Location:  Inpatient Procedure: 2D Echo, Cardiac Doppler and Color Doppler Indications:    Stroke I63.9  History:        Patient has prior history of Echocardiogram examinations, most                 recent 04/02/2018. Stroke, Signs/Symptoms:Chest Pain; Risk                 Factors:Hypertension, Diabetes and Dyslipidemia.  Sonographer:    Tiffany Dance Referring Phys: [id number] [place]  Sonographer Comments: Suboptimal subcostal window. IMPRESSIONS  1. Left ventricular ejection fraction, by estimation, is 55 to 60%. The left ventricle has normal function. The left ventricle has no regional wall motion abnormalities. Left ventricular diastolic parameters are consistent with Grade I diastolic dysfunction (impaired relaxation).  2. Right ventricular systolic  function is normal. The right ventricular size is normal.  3. Left atrial size was mildly dilated.  4. The mitral valve is normal in structure. No evidence of mitral valve regurgitation. No evidence of mitral stenosis.  5. The aortic valve is normal in structure. Aortic valve regurgitation is not visualized. Mild aortic valve sclerosis is present, with no evidence of aortic valve stenosis.  6. The inferior vena cava is normal in size with greater than 50% respiratory variability, suggesting right atrial pressure of 3 mmHg. Comparison(s): Prior images unable to be directly viewed, comparison made by report only. Conclusion(s)/Recommendation(s): No intracardiac source of embolism detected on this transthoracic study. Consider saline contrast study or TEE to evaluate the atrial septal aneurysm for evidence of PFO/shunting. FINDINGS  Left Ventricle: Left ventricular ejection fraction, by estimation, is 55 to 60%. The left ventricle has normal function. The left ventricle has no regional wall motion abnormalities. The left ventricular internal cavity size was normal in size. There is  borderline concentric left ventricular hypertrophy. Left ventricular diastolic parameters are consistent with Grade I diastolic dysfunction (impaired relaxation). Indeterminate filling pressures. Right Ventricle: The right ventricular size is  normal. No increase in right ventricular wall thickness. Right ventricular systolic function is normal. Left Atrium: Left atrial size was mildly dilated. Right Atrium: Right atrial size was normal in size. Pericardium: There is no evidence of pericardial effusion. Mitral Valve: The mitral valve is normal in structure. Mild mitral annular calcification. No evidence of mitral valve regurgitation. No evidence of mitral valve stenosis. Tricuspid Valve: The tricuspid valve is normal in structure. Tricuspid valve regurgitation is not demonstrated. No evidence of tricuspid stenosis. Aortic Valve: The aortic  valve is normal in structure. Aortic valve regurgitation is not visualized. Mild aortic valve sclerosis is present, with no evidence of aortic valve stenosis. Pulmonic Valve: The pulmonic valve was normal in structure. Pulmonic valve regurgitation is not visualized. No evidence of pulmonic stenosis. Aorta: The aortic root is normal in size and structure. Venous: The inferior vena cava is normal in size with greater than 50% respiratory variability, suggesting right atrial pressure of 3 mmHg. IAS/Shunts: The interatrial septum is aneurysmal. No atrial level shunt detected by color flow Doppler.  LEFT VENTRICLE PLAX 2D LVIDd:         4.00 cm     Diastology LVIDs:         3.00 cm     LV e' medial:    3.70 cm/s LV PW:         1.30 cm     LV E/e' medial:  14.9 LV IVS:        1.20 cm     LV e' lateral:   6.64 cm/s LVOT diam:     2.40 cm     LV E/e' lateral: 8.3 LV SV:         80 LV SV Index:   36 LVOT Area:     4.52 cm  LV Volumes (MOD) LV vol d, MOD A2C: 73.3 ml LV vol d, MOD A4C: 82.0 ml LV vol s, MOD A2C: 28.7 ml LV vol s, MOD A4C: 35.1 ml LV SV MOD A2C:     44.6 ml LV SV MOD A4C:     82.0 ml LV SV MOD BP:      46.3 ml RIGHT VENTRICLE             IVC RV Basal diam:  1.80 cm     IVC diam: 1.60 cm RV S prime:     13.50 cm/s TAPSE (M-mode): 2.3 cm LEFT ATRIUM             Index       RIGHT ATRIUM           Index LA diam:        4.00 cm 1.82 cm/m  RA Area:     12.10 cm LA Vol (A2C):   74.9 ml 34.05 ml/m RA Volume:   22.20 ml  10.09 ml/m LA Vol (A4C):   32.2 ml 14.64 ml/m LA Biplane Vol: 50.7 ml 23.05 ml/m  AORTIC VALVE LVOT Vmax:   94.20 cm/s LVOT Vmean:  64.400 cm/s LVOT VTI:    0.176 m  AORTA Ao Root diam: 3.80 cm Ao Asc diam:  3.80 cm MITRAL VALVE MV Area (PHT): 3.12 cm    SHUNTS MV Decel Time: 243 msec    Systemic VTI:  0.18 m MV E velocity: 55.30 cm/s  Systemic Diam: 2.40 cm MV A velocity: 97.30 cm/s MV E/A ratio:  0.57 Mihai Croitoru MD Electronically signed by Sanda Klein MD Signature Date/Time:  01/24/2021/4:17:34 PM    Final  CT HEAD CODE STROKE WO CONTRAST  Result Date: 01/23/2021 CLINICAL DATA:  Code stroke.  Left-sided facial droop EXAM: CT HEAD WITHOUT CONTRAST TECHNIQUE: Contiguous axial images were obtained from the base of the skull through the vertex without intravenous contrast. COMPARISON:  2019 FINDINGS: Brain: There is acute hemorrhage centered within the left thalamus measuring 2.6 x 2.8 x 3 cm. Surrounding edema is present. There is partial effacement of the third ventricle. Intraventricular extension is present with hemorrhage seen within the left lateral and third ventricles. Gray-white differentiation is preserved. Patchy and confluent areas of hypoattenuation in the supratentorial white matter are nonspecific but probably reflect moderate chronic microvascular ischemic changes. Small chronic right cerebellar infarct. Prominence of the ventricles and sulci reflects generalized parenchymal volume loss. Vascular: There is intracranial atherosclerotic calcification at the skull base. Skull: Unremarkable. Sinuses/Orbits: Paranasal sinus mucosal thickening. No acute orbital abnormality. Other: Mastoid air cells are clear. IMPRESSION: Acute left thalamic hemorrhage with mild regional mass effect and intraventricular extension. No hydrocephalus. Chronic microvascular ischemic changes. Small chronic right cerebellar infarct. Initial results were communicated to Dr. Lorrin Goodell at 3:42 pm on 01/23/2021 by text page via the [facility] messaging system. Electronically Signed   By: Macy Mis M.D.   On: 01/23/2021 15:45    PHYSICAL EXAM  Temp:  [98.4 F (36.9 C)-99.8 F (37.7 C)] 98.4 F (36.9 C) (05/14 0800) Pulse Rate:  [73-171] 90 (05/14 1035) Resp:  [12-30] 20 (05/14 1035) BP: (89-170)/(49-112) 145/78 (05/14 1035) SpO2:  [93 %-100 %] 97 % (05/14 1035) Weight:  [101.5 kg] 101.5 kg (05/14 0400)  General - Well nourished, well developed, in no apparent distress.  Ophthalmologic -  fundi not visualized due to noncooperation.  Cardiovascular - Regular rhythm and rate, not in afib now.  Neuro - awake, alert, eyes open, global aphasia except able to close eyes on command but not other commands. Not able to name and repeat. Left gaze preference and not able to cross midline, tracking on the left, not blinking to visual threat on the right, PERRL. Right facial droop. Tongue protrusion not cooperative. LUE able to hold against gravity. LLE drift not cooperative on exam but spontaneous movement. RUE and RLE 0/5, RUE increased muscle tone. Sensation, coordination and gait not tested.    ASSESSMENT/PLAN Dao K Flowersis a 85 y.o.malewith history of A. fib on Eliquis, hypertension, hyperlipidemia, diabetes admitted for L Thalamic ICH withIVH and an ICH score of 2. He is s/p Andexanet alpha for presumed ICH on Eliquis.  ICH -left thalamic ICH and IVH with associated cerebral edema in the setting hypertensive emergency and apixaban therapy for PAF  01/23/2021 head CT acute left thalamic ICH and intraventricular extension, no hydrocephalus  MRI head: 3.0 x 3.1 x 3.4 cm acute intraparenchymal hemorrhage centered at the left thalamus. Mild regional mass effect and 5 mm of left-to-right shift.  MRA head: Grossly negative, No LVO  CUS pending  2D Echo: EF 55-60%,   Repeat CT Head 5/11 and 5/13 - stable IPH, edema and mass effect 62mm MLS.   VTE prophylaxis -Lovenox  On Eliquis for known PAF prior to admission, no anticoagulant or antiplatelets for now.   Therapy recommendations:  SNF  Disposition:  TBD  Cerebral edema  CT repeat showed stable ICH and IVH with 49mm MLS  on 3% saline  Na 138-142-147  Clinically stable, will taper off 3% saline in 24 hours  PAF  Home medication: Eliquis, reversed with Andexxa  Followed by Dr. Harrington Challenger  2019 Echo  showed EF 45-50%  now with improved EF 55-60%  On amiodarone and coreg   No antithrombotic now  Hypertension  Home  meds: Norvasc 10, Coreg 12.5, Cozaar 100mg    Off Cleviprex  On Coreg 3.125 and amlodipine 10 and losartan 50  BP goal < 160  Long term goal normotensive  Hyperlipidemia  Home meds:  Lipitor 80mg   LDL 55, goal < 70  Hold statin in setting of   current ICH    Consider to resume statin at discharge  DM2, controlled  Home meds are glargine 14 u hs, metformin 1000 BID, Januvia 100 daily  HgbA1c 5.8, goal < 7.0  CBGs  SSI  Dysphagia Feeding  Failed swallow eval 5/11, NPO now  Cortrack tube placed  Jevity @ 60cc/hr  Speech to follow  Need PEG if going to SNF  AKI  Cre 1.22->1.39->1.11->0.98  Setting of severe hypertension  Follow renal function and urine output  Avoid nephrotoxins  On tube feeding and IV fluid  Fever Leukocytosis, improved  Tmax 101.7-> afebrile  Monitor fever curve  CXR: no acute findings  CBC with leukocytosis wbc 13-> 8.8  UA: rare bacteria,  Urine Culture negative  Chest xray unremarkable.   Other Stroke Risk Factors  Advanced Age >/= 21   Former Cigarette smoker quit 1995.   Current ETOH use, advised to drink no more than 2 drink(s) a day  Hx stroke/TIA  Other Active Problems    Hospital day # 5  This patient is critically ill due to [place] status post Eliquis reversal, A. fib on Eliquis, IVH, fever, cerebral edema, dysphagia, leukocytosis and at significant risk of neurological worsening, death form hematoma expansion, ischemic stroke, heart failure, seizure, brain herniation. This patient's care requires constant monitoring of vital signs, hemodynamics, respiratory and cardiac monitoring, review of multiple databases, neurological assessment, discussion with family, other specialists and medical decision making of high complexity. I spent 40 minutes of neurocritical care time in the care of this patient.  Discussed with Dr. Carlis Abbott CCM  Rosalin Hawking, MD PhD Stroke Neurology 01/28/2021 11:18 AM    To contact  Stroke Continuity provider, please refer to [email or web address] After hours, contact General Neurology

## 2021-01-28 NOTE — Progress Notes (Signed)
Pt HR sustaining in the 150's BP 98/73. Whitney NP notified. Orders given for amio drip. As RN was gather supplies to start drip pt converted to NSR in the 80's BP 145/78. Orders given to continue to hang the amio but to hold the bolus.

## 2021-01-29 ENCOUNTER — Inpatient Hospital Stay (HOSPITAL_COMMUNITY): Payer: Medicare HMO

## 2021-01-29 DIAGNOSIS — I471 Supraventricular tachycardia: Secondary | ICD-10-CM

## 2021-01-29 DIAGNOSIS — I610 Nontraumatic intracerebral hemorrhage in hemisphere, subcortical: Secondary | ICD-10-CM | POA: Diagnosis not present

## 2021-01-29 DIAGNOSIS — I161 Hypertensive emergency: Secondary | ICD-10-CM | POA: Diagnosis not present

## 2021-01-29 LAB — BASIC METABOLIC PANEL
Anion gap: 7 (ref 5–15)
BUN: 25 mg/dL — ABNORMAL HIGH (ref 8–23)
CO2: 22 mmol/L (ref 22–32)
Calcium: 9.4 mg/dL (ref 8.9–10.3)
Chloride: 123 mmol/L — ABNORMAL HIGH (ref 98–111)
Creatinine, Ser: 0.9 mg/dL (ref 0.61–1.24)
GFR, Estimated: >60 mL/min (ref >60)
Glucose, Bld: 192 mg/dL — ABNORMAL HIGH (ref 70–99)
Potassium: 3.7 mmol/L (ref 3.5–5.1)
Sodium: 152 mmol/L — ABNORMAL HIGH (ref 135–145)

## 2021-01-29 LAB — GLUCOSE, CAPILLARY
Glucose-Capillary: 157 mg/dL — ABNORMAL HIGH (ref 70–99)
Glucose-Capillary: 157 mg/dL — ABNORMAL HIGH (ref 70–99)
Glucose-Capillary: 172 mg/dL — ABNORMAL HIGH (ref 70–99)
Glucose-Capillary: 189 mg/dL — ABNORMAL HIGH (ref 70–99)
Glucose-Capillary: 191 mg/dL — ABNORMAL HIGH (ref 70–99)
Glucose-Capillary: 213 mg/dL — ABNORMAL HIGH (ref 70–99)

## 2021-01-29 LAB — CBC
HCT: 33.8 % — ABNORMAL LOW (ref 39.0–52.0)
Hemoglobin: 10 g/dL — ABNORMAL LOW (ref 13.0–17.0)
MCH: 25.8 pg — ABNORMAL LOW (ref 26.0–34.0)
MCHC: 29.6 g/dL — ABNORMAL LOW (ref 30.0–36.0)
MCV: 87.3 fL (ref 80.0–100.0)
Platelets: 198 10*3/uL (ref 150–400)
RBC: 3.87 MIL/uL — ABNORMAL LOW (ref 4.22–5.81)
RDW: 14.6 % (ref 11.5–15.5)
WBC: 6.8 10*3/uL (ref 4.0–10.5)
nRBC: 0 % (ref 0.0–0.2)

## 2021-01-29 LAB — SODIUM
Sodium: 150 mmol/L — ABNORMAL HIGH (ref 135–145)
Sodium: 153 mmol/L — ABNORMAL HIGH (ref 135–145)
Sodium: 150 mmol/L — ABNORMAL HIGH (ref 135–145)

## 2021-01-29 MED ORDER — CARVEDILOL 12.5 MG PO TABS
12.5000 mg | ORAL_TABLET | Freq: Two times a day (BID) | ORAL | Status: DC
  Administered 2021-01-29 – 2021-01-30 (×2): 12.5 mg
  Filled 2021-01-29 (×2): qty 1

## 2021-01-29 MED ORDER — BISACODYL 10 MG RE SUPP
10.0000 mg | Freq: Once | RECTAL | Status: AC
  Administered 2021-01-29: 10 mg via RECTAL
  Filled 2021-01-29: qty 1

## 2021-01-29 MED ORDER — LOSARTAN POTASSIUM 50 MG PO TABS
50.0000 mg | ORAL_TABLET | Freq: Two times a day (BID) | ORAL | Status: DC
  Administered 2021-01-29 – 2021-02-28 (×60): 50 mg
  Filled 2021-01-29 (×62): qty 1

## 2021-01-29 MED ORDER — METOPROLOL TARTRATE 5 MG/5ML IV SOLN
5.0000 mg | INTRAVENOUS | Status: DC | PRN
  Administered 2021-01-30 (×2): 5 mg via INTRAVENOUS
  Filled 2021-01-29 (×3): qty 5

## 2021-01-29 MED ORDER — HYDRALAZINE HCL 20 MG/ML IJ SOLN
10.0000 mg | Freq: Once | INTRAMUSCULAR | Status: AC
  Administered 2021-01-29: 10 mg via INTRAVENOUS
  Filled 2021-01-29: qty 1

## 2021-01-29 MED ORDER — METOPROLOL TARTRATE 5 MG/5ML IV SOLN
5.0000 mg | INTRAVENOUS | Status: AC
  Administered 2021-01-29: 5 mg via INTRAVENOUS
  Filled 2021-01-29: qty 5

## 2021-01-29 MED ORDER — INSULIN DETEMIR 100 UNIT/ML ~~LOC~~ SOLN
15.0000 [IU] | Freq: Two times a day (BID) | SUBCUTANEOUS | Status: DC
  Administered 2021-01-29 – 2021-02-01 (×7): 15 [IU] via SUBCUTANEOUS
  Filled 2021-01-29 (×8): qty 0.15

## 2021-01-29 MED ORDER — INSULIN ASPART 100 UNIT/ML IJ SOLN
3.0000 [IU] | INTRAMUSCULAR | Status: DC
  Administered 2021-01-29 (×4): 6 [IU] via SUBCUTANEOUS
  Administered 2021-01-30: 3 [IU] via SUBCUTANEOUS
  Administered 2021-01-30: 6 [IU] via SUBCUTANEOUS
  Administered 2021-01-30: 3 [IU] via SUBCUTANEOUS
  Administered 2021-01-30 – 2021-01-31 (×3): 6 [IU] via SUBCUTANEOUS
  Administered 2021-01-31 (×2): 3 [IU] via SUBCUTANEOUS
  Administered 2021-01-31 – 2021-02-01 (×3): 6 [IU] via SUBCUTANEOUS
  Administered 2021-02-01: 3 [IU] via SUBCUTANEOUS
  Administered 2021-02-02: 6 [IU] via SUBCUTANEOUS
  Administered 2021-02-02: 3 [IU] via SUBCUTANEOUS
  Administered 2021-02-02 (×2): 6 [IU] via SUBCUTANEOUS
  Administered 2021-02-03: 3 [IU] via SUBCUTANEOUS
  Administered 2021-02-03 (×2): 6 [IU] via SUBCUTANEOUS
  Administered 2021-02-03: 3 [IU] via SUBCUTANEOUS
  Administered 2021-02-03 – 2021-02-04 (×3): 6 [IU] via SUBCUTANEOUS
  Administered 2021-02-04: 9 [IU] via SUBCUTANEOUS
  Administered 2021-02-04: 6 [IU] via SUBCUTANEOUS
  Administered 2021-02-04 (×2): 3 [IU] via SUBCUTANEOUS
  Administered 2021-02-04 – 2021-02-05 (×5): 6 [IU] via SUBCUTANEOUS
  Administered 2021-02-05: 3 [IU] via SUBCUTANEOUS
  Administered 2021-02-05: 9 [IU] via SUBCUTANEOUS
  Administered 2021-02-06: 3 [IU] via SUBCUTANEOUS
  Administered 2021-02-06: 6 [IU] via SUBCUTANEOUS
  Administered 2021-02-06: 3 [IU] via SUBCUTANEOUS
  Administered 2021-02-06 (×2): 6 [IU] via SUBCUTANEOUS
  Administered 2021-02-07: 3 [IU] via SUBCUTANEOUS
  Administered 2021-02-07 (×2): 6 [IU] via SUBCUTANEOUS
  Administered 2021-02-07: 3 [IU] via SUBCUTANEOUS
  Administered 2021-02-08: 9 [IU] via SUBCUTANEOUS
  Administered 2021-02-08: 6 [IU] via SUBCUTANEOUS
  Administered 2021-02-08 – 2021-02-09 (×2): 3 [IU] via SUBCUTANEOUS
  Administered 2021-02-09: 6 [IU] via SUBCUTANEOUS
  Administered 2021-02-09 (×2): 3 [IU] via SUBCUTANEOUS
  Administered 2021-02-09 – 2021-02-10 (×5): 6 [IU] via SUBCUTANEOUS
  Administered 2021-02-10 (×2): 3 [IU] via SUBCUTANEOUS
  Administered 2021-02-10: 6 [IU] via SUBCUTANEOUS
  Administered 2021-02-11: 3 [IU] via SUBCUTANEOUS
  Administered 2021-02-11 (×3): 6 [IU] via SUBCUTANEOUS
  Administered 2021-02-12: 9 [IU] via SUBCUTANEOUS
  Administered 2021-02-12 (×3): 6 [IU] via SUBCUTANEOUS
  Administered 2021-02-13 (×4): 3 [IU] via SUBCUTANEOUS
  Administered 2021-02-13: 6 [IU] via SUBCUTANEOUS
  Administered 2021-02-13 – 2021-02-14 (×3): 3 [IU] via SUBCUTANEOUS
  Administered 2021-02-14: 6 [IU] via SUBCUTANEOUS
  Administered 2021-02-14 – 2021-02-16 (×7): 3 [IU] via SUBCUTANEOUS
  Administered 2021-02-17: 6 [IU] via SUBCUTANEOUS
  Administered 2021-02-17 – 2021-02-22 (×14): 3 [IU] via SUBCUTANEOUS
  Administered 2021-02-23: 6 [IU] via SUBCUTANEOUS
  Administered 2021-02-23 – 2021-02-24 (×6): 3 [IU] via SUBCUTANEOUS
  Administered 2021-02-24: 6 [IU] via SUBCUTANEOUS
  Administered 2021-02-25: 3 [IU] via SUBCUTANEOUS
  Administered 2021-02-26: 6 [IU] via SUBCUTANEOUS
  Administered 2021-02-27 – 2021-02-28 (×3): 3 [IU] via SUBCUTANEOUS

## 2021-01-29 NOTE — Progress Notes (Signed)
NAME:  Brian [name], MRN:  [id number], DOB:  1936/08/27, LOS: 6 ADMISSION DATE:  01/23/2021, CONSULTATION DATE: 5/13 REFERRING MD:  Dr. Stroke, CHIEF COMPLAINT: ICH  History of Present Illness:   Patient is 85 yo AA male admitted to [facility] on 01/23/2021 for acute onset right sided weakness and language deficit. Pertinent PMH of HTN, HLD, EF 45-50% on 2019, T2DM, Chronic anemia, Afib on Eliquis at home, prior L MCa Stroke. CTH w/o contrast and MRI on 5/9 showing L thalamic ICH with cytoxic edema/with mild 52mm left to right shift. Patient was anticoagulated with Apixaban for A-fib which was reversed in ED with Andexanet Alpha. Also requiring Cleviprex infusion for hypertensive emergency. Admitted to Neuro ICU under the stroke service.   Course complicated with ongoing HTN requiring Cleviprex infusion. Mentation initially somewhat improving. Patient developed fever with increased lethargy on 5/12. Failed swallow eval and core-track was placed. Cleviprex stopped on 5/12 and started on oral anti hypertensives. On 5/13 low grade fever persist with downtrending WBC. EEG shows focal left-sided slowing but no focal seizures. Hypertonic saline started on 5/13. Patient developed HR in the 150-160s and PCCM consulted.   Pertinent  Medical History  Stroke  Obesity HTN HLD Type 2 diabetes  Chronic anemia A-fib with chronic anticoagulation  Significant Hospital Events: Including procedures, antibiotic start and stop dates in addition to other pertinent events   . 5/9- admitted for ICH.  [name] 5/9- CT head w/o: acute left thalamic hemorrhage with mild mass effect. . 5/9 MR brain w/o: 3.0 x 3.1 x 3.4 cm acute intraparenchymal hemorrhage centered at the left thalamus. Surrounding vasogenic edema with mild regional mass effect and 5 mm of left-to-right shift. . 5/11 CT Head w/o: No significant change. Surrounding edema mildly increased.  . 5/13 Increasing lethargy. Repeat CT showed increasing midline shift.  Hypertonic saline initiated.  . 5/14 developed atrial fibrillation with rapid ventricular rate with a rate of 1 50-1 60, IV amiodarone drip started . 5/15 less R sided neglect, moving out of ICU  Interim History / Subjective:  Hypertensive overnight, required several hypertension PRN meds.  Objective   Blood pressure (!) 160/67, pulse 83, temperature 99.3 F (37.4 C), temperature source Axillary, resp. rate 14, height 6' (1.829 m), weight 104.3 kg, SpO2 95 %.        Intake/Output Summary (Last 24 hours) at 01/29/2021 0837 Last data filed at 01/29/2021 0800 Gross per 24 hour  Intake 2838.46 ml  Output 2600 ml  Net 238.46 ml   Filed Weights   01/26/21 0500 01/28/21 0400 01/29/21 0400  Weight: 95.2 kg 101.5 kg 104.3 kg    Examination: General: elderly man lying in bed in NAD HEENT: [place]/AT, eyes anicteric, NGT in place Neuro: awake, alert, tracking more to the right but still has preference to the left. Mimicking motions more easily today- wiggled toes, bent left knee. Able to assist holding LUE off bed, but won't maintain it on his own.  CV: S1S2, RRR PULM: rhonchi bilaterally, saturating in mid-90s on RA GI: soft, NT, ND Extremities: warm, mild pedal edema worse on R Skin: no rashes or wounds  Labs/imaging that I have personally reviewed    Na+ 152 BUN 22 Cr 0.9 WBC 6.8 H/H 10/ 33.8  Resolved Hospital Problem list   AKI  Assessment & Plan:   ICH on eliquis; s/p andexant alpha -Left thalamic ICH with 7 mm midline shift Hypertensive emergency, remains very resistant to escalating meds P:: -Appreciate stroke team's management  -  con't to monitor Na+; let it drift down  -repeat head CT today -SBP goal less than 160- increasing coreg and losartan today -Continue amlodipine -con't PRN labetalol and hydralazine  -neuroprotective measures -aspiration precautions- NTS this morning; had a strong cough -con't statin, holding eliquis.  SVT, recurrent overnight -Developed  5/14 HR in 170s with narrow complex when arrived to bedside. Treated with 6 mg, then 12 mg of Adenosine.  -Patient again seen with SVT versus A. fib RVR on 5/14 with a rate of 160 History of Atrial Fibrillation -Chronically anticoagulated on Eliquis, this was reversed on arrival  P: -Holding anticoagulation due to [place] -increasing coreg -con't amiodarone infusion for now; hopefully can transition to PO if no repeat episodes -Optimize electrolytes -Continuous telemetry  Possible aspiration pneumonia P: -complete 7 days of unasyn (day #3) -Sputum culture not collected -Trend CBC and fever curve -Continue tube feeds via core track -Aspiration precautions, NTS PRN  DM 2; remains uncontrolled P: -adding levemir 15 units BID -con't TF coverage 5 units Q4h with hold parameters -SSI PRN -goal 140-180  Dysphagia P: -Continue tube feeds via core track -Aspiration precautions -SLP following -NTS PRN  Acute anemia likely due to critical illness -transfuse for Hb <7 or hemodynamically significant bleeding  -monitor   Transferring to the floor. PCCM will sign off, please call with questions.  Best practice   Diet:  Tube Feed  Pain/Anxiety/Delirium protocol (if indicated): No VAP protocol (if indicated): Not indicated DVT prophylaxis: LMWH and Contraindicated GI prophylaxis: PPI Glucose control:  SSI Yes Central venous access:  N/A Arterial line:  N/A Foley:  N/A Mobility:  bed rest  PT consulted: Yes Last date of multidisciplinary goals of care discussion Per primary  Code Status:  full code Disposition: ICU     Julian Hy, DO 01/29/21 10:40 AM [facility]

## 2021-01-29 NOTE — Progress Notes (Signed)
Repeat episode of narrow complex tachycardia earlier with drop in BP form 160s to 110s. Ordered adenosine, but prior to administration, he reverted to NSR. I have asked for cardiology consult.   Roland Rack, MD [facility] [phone]  If 7pm- 7am, please page neurology on call as listed in [place].

## 2021-01-29 NOTE — Progress Notes (Addendum)
Pt's SBP out of parameters after all PRN meds have been given. Called Neuro MD. Hydralazine order given. Will continue to monitor.

## 2021-01-29 NOTE — Consult Note (Signed)
Cardiology Consult    Patient ID: [name] MRN: [id number], DOB/AGE: 1935/11/04   Admit date: 01/23/2021 Date of Consult: 01/29/2021  Primary Physician: [name], MD Primary Cardiologist: Dorris Carnes, MD Requesting Provider: Roland Rack, MD  Patient Profile    Brian [name] is a 85 y.o. male with a history of paroxysmal AF, recurrent stroke, type 2 DM, and HTN. He is admitted for a thalamic hemorrhage and is being seen today for the evaluation of paroxysmal tachycardia.   History of Present Illness    The patient is aphasic. History obtain from chart, review of telemetry, RN, and neurology team.   He was admitted May 9 as code stroke after he was noted to have dysarthria, facial droop, and right sided weakness while at a car wash. CT and MRI revealed a left thalamic ICH with cytoxic edema associated with mild 41mm left shift. He was given Andexxa for apixaban reversal and admitted to the ICU on clevidipine. From a neurologic standpoint, he has remain stable, though still essentially aphasic. Failed swallow eval and Cortrak has been placed. He is off clevidipine and transitioned to oral/home antihypertensives, including beta blocker. He is on broad spectrum antibiotic due to low-grade fevers that have resolved. EEG showed focal left-sided slowing but no seizures.  Otherwise, he has had intermittent tachycardia since admission at a rate of 140-150. On my review of telemetry, he had brief episodes of a regular, narrow complex, short RP tachycardia lasting only a few minutes on 5/10 and 5/12, both initiating and terminating spontaneously with PACs. Then 5/13 there is an episode at a rate of about 160 lasting approximately 1 hour for which he was given 3 doses of adenosine with rapid conversion back to sinus rhythm with the third dose. He was started on low-dose metoprolol via NGT that has since been transitioned to carvedilol. Since the prolonged episode, they have become somewhat  more frequent, occurring twice on 5/14 and now for the third time today, though all of these episodes have lasted 23min or less. He had been started on an amiodarone infusion on 5/14, currently at 30mg /hr, and carvedilol was increased back to 12.5mg  this afternoon. His blood pressure with these episodes by cuff pressure tends to drop from the 150s/160s to the 0000000 systolic, but he does not display any overt symptoms according to his team. I do not see that he has any history of an SVT other than atrial fibrillation. Cardiology was consulted for further recommendations after the most recent episode this evening lasting 11 min.   Of note, his echo during this admission showed mild left atrial enlargement but was otherwise normal. Baseline ECG shows NSR with first degree AVB and LAFB. Regarding his AF history, this was diagnosed in 2019 by event monitor - there was actually only a single AF episode, but 17 SVTs that appear similar to the SVTs during this admission but much shorter duration with reports of rare palpitations only. He has been followed by Dr. Dorris Carnes for this and treated with rate control only thus far, in addition to apixaban.  Past Medical History   Past Medical History:  Diagnosis Date  . ALLERGIC RHINITIS 10/13/2007  . ANEMIA, CHRONIC DISEASE NEC 03/31/2007  . BENIGN PROSTATIC HYPERTROPHY 03/31/2007  . Chest pain    Stress echo, normal, December, 2012  . Chronic anemia   . DIABETES MELLITUS, TYPE II 03/31/2007  . [place] DISEASE, LUMBAR 03/31/2007  . ED (erectile dysfunction)   . Ejection fraction  EF  normal, stress echo, December,  2012  . History of prostatitis   . HYPERLIPIDEMIA 03/31/2007  . HYPERTENSION 03/31/2007  . Nephrolithiasis 12/07/2010  . Obesity   . POLYP, ANAL AND RECTAL 03/31/2007  . Right eye trauma    hx as child  . Stroke [facility])     Past Surgical History:  Procedure Laterality Date  . [place] SURGERY     1999  . PROSTATE BIOPSY     s/p  . RECTAL  POLYPECTOMY     Transanal excision 10/2005  . SKIN BIOPSY     s/p right upper back 2009- benign Dr. Tonia Brooms     No Known Allergies Inpatient Medications    .  stroke: mapping our early stages of recovery book   Does not apply Once  . amiodarone  150 mg Intravenous Once  . amLODipine  10 mg Per Tube Daily  . carvedilol  12.5 mg Per Tube BID WC  . chlorhexidine  15 mL Mouth Rinse BID  . Chlorhexidine Gluconate Cloth  6 each Topical Daily  . enoxaparin (LOVENOX) injection  40 mg Subcutaneous Q24H  . feeding supplement (PROSource TF)  45 mL Per Tube TID  . gabapentin  100 mg Per Tube TID  . insulin aspart  3-9 Units Subcutaneous Q4H  . insulin aspart  5 Units Subcutaneous Q4H  . insulin detemir  15 Units Subcutaneous BID  . losartan  50 mg Per Tube BID  . mouth rinse  15 mL Mouth Rinse q12n4p  . pantoprazole sodium  40 mg Per Tube QHS  . senna-docusate  1 tablet Per Tube BID  . sodium chloride flush  3 mL Intravenous Once    Family History    Family History  Problem Relation Age of Onset  . Colon polyps Mother   . Stroke Mother        due to ICA stenosis  . CVA Neg Hx   . Seizures Neg Hx    He indicated that the status of his mother is unknown. He indicated that the status of his father is unknown. He indicated that his maternal grandmother is deceased. He indicated that his maternal grandfather is deceased. He indicated that his paternal grandmother is deceased. He indicated that his paternal grandfather is deceased. He indicated that the status of his neg hx is unknown.   Social History    Social History   Socioeconomic History  . Marital status: Widowed    Spouse name: Not on file  . Number of children: 2  . Years of education: Not on file  . Highest education level: Not on file  Occupational History  . Occupation: Acupuncturist    Comment: Retired 1999  Tobacco Use  . Smoking status: Former Smoker    Years: 10.00    Quit date: 1995    Years since  quitting: 27.3  . Smokeless tobacco: Never Used  Vaping Use  . Vaping Use: Never used  Substance and Sexual Activity  . Alcohol use: Yes    Alcohol/week: 1.0 standard drink    Types: 1 Cans of beer per week    Comment: occasional or yearly  . Drug use: No  . Sexual activity: Not on file  Other Topics Concern  . Not on file  Social History Narrative  . Not on file   Social Determinants of Health   Financial Resource Strain: Not on file  Food Insecurity: Not on file  Transportation Needs: Not on file  Physical Activity: Not on file  Stress: Not on file  Social Connections: Not on file  Intimate Partner Violence: Not on file     Review of Systems    Unable to obtain due to patient condition  Physical Exam    Blood pressure (!) 147/69, pulse 79, temperature 99.1 F (37.3 C), temperature source Oral, resp. rate 11, height 6' (1.829 m), weight 104.3 kg, SpO2 93 %.     Intake/Output Summary (Last 24 hours) at 01/29/2021 2230 Last data filed at 01/29/2021 2200 Gross per 24 hour  Intake 2280.21 ml  Output 1650 ml  Net 630.21 ml   Wt Readings from Last 3 Encounters:  01/29/21 104.3 kg  01/03/21 99.3 kg  12/06/20 100.8 kg    CONSTITUTIONAL: aphasic and inattentive, mits and NG tube in place, overall ill-appearing HEENT: symmetric without obvious deformities NECK: no JVD, no cervical adenopathy, no thyromegaly CARDIOVASCULAR: Regular rhythm. No gallop, murmur, or rub. Normal S1/S2. Radial pulses intact. No carotid bruits. PULMONARY/CHEST WALL: no deformities, normal breath sounds bilaterally, normal work of breathing ABDOMINAL: soft, non-tender, non-distended EXTREMITIES: no edema or muscle atrophy, warm and well-perfused SKIN: Dry and intact without apparent rashes or wounds.  NEUROLOGIC: aphasic, inattentive, no abnormal movements.   Labs    Lab Results  Component Value Date   WBC 6.8 01/29/2021   HGB 10.0 (L) 01/29/2021   HCT 33.8 (L) 01/29/2021   MCV 87.3  01/29/2021   PLT 198 01/29/2021    Recent Labs  Lab 01/27/21 1737 01/27/21 2016 01/29/21 0640 01/29/21 1325 01/29/21 1852  NA 142   < > 152*   < > 150*  K 3.9   < > 3.7  --   --   CL 110   < > 123*  --   --   CO2 21*   < > 22  --   --   BUN 34*   < > 25*  --   --   CREATININE 1.11   < > 0.90  --   --   CALCIUM 9.1   < > 9.4  --   --   PROT 6.7  --   --   --   --   BILITOT 1.1  --   --   --   --   ALKPHOS 52  --   --   --   --   ALT 21  --   --   --   --   AST 34  --   --   --   --   GLUCOSE 216*   < > 192*  --   --    < > = values in this interval not displayed.   Lab Results  Component Value Date   CHOL 113 01/28/2021   HDL 41 01/28/2021   LDLCALC 64 01/28/2021   TRIG 41 01/28/2021   No results found for: [facility]   Radiology Studies    CT HEAD WO CONTRAST  Result Date: 01/29/2021 CLINICAL DATA:  Cerebral hemorrhage suspected EXAM: CT HEAD WITHOUT CONTRAST TECHNIQUE: Contiguous axial images were obtained from the base of the skull through the vertex without intravenous contrast. COMPARISON:  Two days ago FINDINGS: Brain: Acute hematoma centered in the left thalamus and adjacent white matter, 3.1 cm in maximal span on axial images, unchanged when remeasured in a similar fashion. Intraventricular hemorrhage seen at the lateral ventricles with stable mild ventriculomegaly. Chronic small vessel ischemic type low-density in the hemispheric white matter.  No evidence of acute cortical infarct. Small remote right cerebellar infarct. Regional mass effect from the hematoma is unchanged. Vascular: Atheromatous calcification Skull: Negative Sinuses/Orbits: Nasal intubation. IMPRESSION: 1. No progression of the left thalamic or intraventricular hemorrhage. 2. Unchanged ventriculomegaly. Electronically Signed   By: Monte Fantasia M.D.   On: 01/29/2021 10:21   CT Head Wo Contrast  Result Date: 01/27/2021 CLINICAL DATA:  Follow-up hemorrhagic stroke involving the LEFT thalamus with  extension into the ventricular system. EXAM: CT HEAD WITHOUT CONTRAST TECHNIQUE: Contiguous axial images were obtained from the base of the skull through the vertex without intravenous contrast. COMPARISON:  01/25/2021 and earlier. FINDINGS: Brain: Hematoma involving the LEFT thalamus measuring approximately 2.6 x 2.8 x 2.8 cm, unchanged in size since the initial 01/23/2021 CT. Interval decrease in the amount of intraventricular hemorrhage in the lateral ventricles and the third ventricle. Mass-effect with midline shift of approximately 7 mm to the RIGHT, unchanged. No new hemorrhage or hematoma. No extra-axial fluid collections. Vascular: Mild BILATERAL carotid siphon and vertebral artery atherosclerosis. Hyperdense vessel. Skull: No skull fracture or other focal osseous abnormality involving the skull. Sinuses/Orbits: Paranasal sinuses, mastoid air cells and middle ear cavities well-aerated. Benign senile scleral calcifications in both eyes. Other: None. IMPRESSION: 1. Stable LEFT thalamic hematoma and associated midline shift to the RIGHT of approximately 7 mm. 2. Decreasing intraventricular hemorrhage. 3. No new intracranial abnormality. Electronically Signed   By: Evangeline Dakin M.D.   On: 01/27/2021 10:55   CT HEAD WO CONTRAST  Result Date: 01/25/2021 CLINICAL DATA:  Follow-up examination for acute stroke. EXAM: CT HEAD WITHOUT CONTRAST TECHNIQUE: Contiguous axial images were obtained from the base of the skull through the vertex without intravenous contrast. COMPARISON:  Prior CT from 01/23/2021. FINDINGS: Brain: Intraparenchymal hemorrhage centered at the left thalamus is not significantly changed in size or morphology as compared to previous exam. Surrounding edema is stable to mildly increased. Similar regional mass effect with localized left-to-right shift. Intraventricular extension with blood within the lateral and third ventricles, relatively stable. Stable ventricular size and morphology without  hydrocephalus or trapping. Basilar cisterns remain patent. No new intracranial hemorrhage or large vessel territory infarct. No extra-axial collection. Underlying atrophy with chronic microvascular ischemic disease again noted. Small remote right cerebellar infarcts noted as well. Vascular: No hyperdense vessel. Scattered vascular calcifications noted within the carotid siphons. Skull: Scalp soft tissues and calvarium demonstrate no acute finding. Sinuses/Orbits: Globes and orbital soft tissues demonstrate no acute finding. Mild scattered mucosal thickening noted within the ethmoidal air cells and maxillary sinuses. Paranasal sinuses are otherwise clear. Mastoid air cells remain largely clear. Other: None. IMPRESSION: 1. No significant interval change in size and morphology of left thalamic hemorrhage. Surrounding edema has mildly increased from prior, although regional mass effect and localized left-to-right shift is not significantly changed. 2. Intraventricular extension with blood within the lateral and third ventricles, stable. Stable ventricular size and morphology without hydrocephalus or trapping. 3. No other new acute intracranial abnormality. Electronically Signed   By: Jeannine Boga M.D.   On: 01/25/2021 04:19   CT HEAD WO CONTRAST  Result Date: 01/23/2021 CLINICAL DATA:  Follow-up intracranial hemorrhage EXAM: CT HEAD WITHOUT CONTRAST TECHNIQUE: Contiguous axial images were obtained from the base of the skull through the vertex without intravenous contrast. COMPARISON:  CT from earlier in the same day. FINDINGS: Brain: Parenchymal hemorrhage is again noted in the region of the left thalamus. Some surrounding edema is noted. The hematoma measures approximately 3.0 x 2.4 cm  and roughly stable from the prior exam. Some intraventricular component is again noted particularly in the left lateral ventricle. Chronic white matter ischemic changes are noted. No new focal hemorrhage is seen. Chronic right  cerebellar infarct is noted. Vascular: No hyperdense vessel or unexpected calcification. Skull: Normal. Negative for fracture or focal lesion. Sinuses/Orbits: No acute finding. Other: None. IMPRESSION: Stable appearing left thalamic hemorrhage with intraventricular component. No new focal abnormality is noted. Electronically Signed   By: Inez Catalina M.D.   On: 01/23/2021 21:54   MR MRA HEAD WO CONTRAST  Result Date: 01/23/2021 CLINICAL DATA:  Initial evaluation for acute intracranial hemorrhage, headache. EXAM: MRI HEAD WITHOUT CONTRAST MRA HEAD WITHOUT CONTRAST TECHNIQUE: Multiplanar, multi-echo pulse sequences of the brain and surrounding structures were acquired without intravenous contrast. Angiographic images of the Circle of Willis were acquired using MRA technique without intravenous contrast. COMPARISON: No pertinent prior exam. COMPARISON:  Prior CT from earlier the same day as well as previous MRI from 06/23/2018. FINDINGS: MRI HEAD FINDINGS Brain: Examination moderately to severely degraded by motion artifact. Generalized age-related cerebral atrophy. Patchy and confluent T2/FLAIR hyperintensity within the periventricular and deep white matter both cerebral hemispheres most consistent with chronic small vessel ischemic disease. Few small remote right cerebellar infarcts noted. Previously identified acute intraparenchymal hemorrhage centered at the left thalamus again seen. Bleed measures 3.0 x 3.1 x 3.4 cm on this motion degraded exam (estimated volume 16 cc). No visible underlying mass lesion or other structure abnormality on this motion degraded noncontrast examination. Surrounding vasogenic edema with mild regional mass effect. Associated mild localized left-to-right shift measures up to 5 mm at the septum pellucidum. Associated intraventricular extension with blood within the left greater than right lateral ventricles. No hydrocephalus or ventricular trapping at this time. No other evidence for  acute intracranial hemorrhage. No other foci of susceptibility artifact to suggest chronic hemorrhage elsewhere within the brain. No other foci of restricted diffusion to suggest acute or subacute ischemia. Gray-white matter differentiation otherwise maintained. No mass lesion or mass effect elsewhere within the brain. No extra-axial fluid collection. Pituitary gland suprasellar region within normal limits. Vascular: Major intracranial vascular flow voids are maintained. Skull and upper cervical spine: Craniocervical junction within normal limits. Bone marrow signal intensity normal. No scalp soft tissue abnormality. Sinuses/Orbits: Patient status post ocular lens replacement on the right. Globes and orbital soft tissues demonstrate no acute finding. Paranasal sinuses are largely clear. Trace bilateral mastoid effusions, of doubtful significance. Inner ear structures grossly normal. Other: None. MRA HEAD FINDINGS ANTERIOR CIRCULATION: Examination degraded by motion artifact. Both internal carotid arteries patent to the termini without stenosis or other appreciable abnormality. A1 segments patent. Anterior communicating artery complex grossly within normal limits. Anterior cerebral arteries grossly patent to their distal aspects without appreciable stenosis. No obvious M1 stenosis. Grossly normal MCA bifurcations. Distal MCA branches well perfused and symmetric. POSTERIOR CIRCULATION: Both V4 segments patent to the vertebrobasilar junction without stenosis. Left vertebral artery dominant. Left PICA origin patent and normal. Right PICA origin not definitely seen. Basilar patent to its distal aspect without stenosis. Superior cerebellar arteries patent bilaterally. Both PCAs primarily supplied via the basilar. PCAs perfused to their distal aspects without appreciable stenosis. No intracranial aneurysm. No vascular abnormality seen underlying the acute left thalamic hemorrhage. IMPRESSION: MRI HEAD IMPRESSION: 1. Motion  degraded exam. 2. 3.0 x 3.1 x 3.4 cm acute intraparenchymal hemorrhage centered at the left thalamus. Surrounding vasogenic edema with mild regional mass effect and 5 mm of left-to-right  shift. No visible underlying mass lesion or other structure abnormality on this motion degraded exam. 3. Associated intraventricular extension with blood within the left greater than right lateral ventricles. No hydrocephalus or ventricular trapping at this time. 4. Underlying age-related cerebral atrophy with mild chronic small vessel ischemic disease, with a few small remote right cerebellar infarcts. MRA HEAD IMPRESSION: 1. Technically limited exam due to extensive motion artifact. 2. Grossly negative intracranial MRA. No large vessel occlusion, hemodynamically significant stenosis, or other acute vascular abnormality. No vascular abnormality seen underlying the acute left thalamic hemorrhage. Please note that while a MRA neck was also ordered for this exam, this was unable to be performed due to the patient's inability to tolerate the full length of the study and extensive motion artifact. Electronically Signed   By: Jeannine Boga M.D.   On: 01/23/2021 21:41   MR ANGIO NECK W WO CONTRAST  Result Date: 01/23/2021 CLINICAL DATA:  Initial evaluation for acute intracranial hemorrhage, headache. EXAM: MRI HEAD WITHOUT CONTRAST MRA HEAD WITHOUT CONTRAST TECHNIQUE: Multiplanar, multi-echo pulse sequences of the brain and surrounding structures were acquired without intravenous contrast. Angiographic images of the Circle of Willis were acquired using MRA technique without intravenous contrast. COMPARISON: No pertinent prior exam. COMPARISON:  Prior CT from earlier the same day as well as previous MRI from 06/23/2018. FINDINGS: MRI HEAD FINDINGS Brain: Examination moderately to severely degraded by motion artifact. Generalized age-related cerebral atrophy. Patchy and confluent T2/FLAIR hyperintensity within the periventricular  and deep white matter both cerebral hemispheres most consistent with chronic small vessel ischemic disease. Few small remote right cerebellar infarcts noted. Previously identified acute intraparenchymal hemorrhage centered at the left thalamus again seen. Bleed measures 3.0 x 3.1 x 3.4 cm on this motion degraded exam (estimated volume 16 cc). No visible underlying mass lesion or other structure abnormality on this motion degraded noncontrast examination. Surrounding vasogenic edema with mild regional mass effect. Associated mild localized left-to-right shift measures up to 5 mm at the septum pellucidum. Associated intraventricular extension with blood within the left greater than right lateral ventricles. No hydrocephalus or ventricular trapping at this time. No other evidence for acute intracranial hemorrhage. No other foci of susceptibility artifact to suggest chronic hemorrhage elsewhere within the brain. No other foci of restricted diffusion to suggest acute or subacute ischemia. Gray-white matter differentiation otherwise maintained. No mass lesion or mass effect elsewhere within the brain. No extra-axial fluid collection. Pituitary gland suprasellar region within normal limits. Vascular: Major intracranial vascular flow voids are maintained. Skull and upper cervical spine: Craniocervical junction within normal limits. Bone marrow signal intensity normal. No scalp soft tissue abnormality. Sinuses/Orbits: Patient status post ocular lens replacement on the right. Globes and orbital soft tissues demonstrate no acute finding. Paranasal sinuses are largely clear. Trace bilateral mastoid effusions, of doubtful significance. Inner ear structures grossly normal. Other: None. MRA HEAD FINDINGS ANTERIOR CIRCULATION: Examination degraded by motion artifact. Both internal carotid arteries patent to the termini without stenosis or other appreciable abnormality. A1 segments patent. Anterior communicating artery complex  grossly within normal limits. Anterior cerebral arteries grossly patent to their distal aspects without appreciable stenosis. No obvious M1 stenosis. Grossly normal MCA bifurcations. Distal MCA branches well perfused and symmetric. POSTERIOR CIRCULATION: Both V4 segments patent to the vertebrobasilar junction without stenosis. Left vertebral artery dominant. Left PICA origin patent and normal. Right PICA origin not definitely seen. Basilar patent to its distal aspect without stenosis. Superior cerebellar arteries patent bilaterally. Both PCAs primarily supplied via the basilar.  PCAs perfused to their distal aspects without appreciable stenosis. No intracranial aneurysm. No vascular abnormality seen underlying the acute left thalamic hemorrhage. IMPRESSION: MRI HEAD IMPRESSION: 1. Motion degraded exam. 2. 3.0 x 3.1 x 3.4 cm acute intraparenchymal hemorrhage centered at the left thalamus. Surrounding vasogenic edema with mild regional mass effect and 5 mm of left-to-right shift. No visible underlying mass lesion or other structure abnormality on this motion degraded exam. 3. Associated intraventricular extension with blood within the left greater than right lateral ventricles. No hydrocephalus or ventricular trapping at this time. 4. Underlying age-related cerebral atrophy with mild chronic small vessel ischemic disease, with a few small remote right cerebellar infarcts. MRA HEAD IMPRESSION: 1. Technically limited exam due to extensive motion artifact. 2. Grossly negative intracranial MRA. No large vessel occlusion, hemodynamically significant stenosis, or other acute vascular abnormality. No vascular abnormality seen underlying the acute left thalamic hemorrhage. Please note that while a MRA neck was also ordered for this exam, this was unable to be performed due to the patient's inability to tolerate the full length of the study and extensive motion artifact. Electronically Signed   By: Rise Mu M.D.    On: 01/23/2021 21:41   MR BRAIN WO CONTRAST  Result Date: 01/23/2021 CLINICAL DATA:  Initial evaluation for acute intracranial hemorrhage, headache. EXAM: MRI HEAD WITHOUT CONTRAST MRA HEAD WITHOUT CONTRAST TECHNIQUE: Multiplanar, multi-echo pulse sequences of the brain and surrounding structures were acquired without intravenous contrast. Angiographic images of the Circle of Willis were acquired using MRA technique without intravenous contrast. COMPARISON: No pertinent prior exam. COMPARISON:  Prior CT from earlier the same day as well as previous MRI from 06/23/2018. FINDINGS: MRI HEAD FINDINGS Brain: Examination moderately to severely degraded by motion artifact. Generalized age-related cerebral atrophy. Patchy and confluent T2/FLAIR hyperintensity within the periventricular and deep white matter both cerebral hemispheres most consistent with chronic small vessel ischemic disease. Few small remote right cerebellar infarcts noted. Previously identified acute intraparenchymal hemorrhage centered at the left thalamus again seen. Bleed measures 3.0 x 3.1 x 3.4 cm on this motion degraded exam (estimated volume 16 cc). No visible underlying mass lesion or other structure abnormality on this motion degraded noncontrast examination. Surrounding vasogenic edema with mild regional mass effect. Associated mild localized left-to-right shift measures up to 5 mm at the septum pellucidum. Associated intraventricular extension with blood within the left greater than right lateral ventricles. No hydrocephalus or ventricular trapping at this time. No other evidence for acute intracranial hemorrhage. No other foci of susceptibility artifact to suggest chronic hemorrhage elsewhere within the brain. No other foci of restricted diffusion to suggest acute or subacute ischemia. Gray-white matter differentiation otherwise maintained. No mass lesion or mass effect elsewhere within the brain. No extra-axial fluid collection. Pituitary  gland suprasellar region within normal limits. Vascular: Major intracranial vascular flow voids are maintained. Skull and upper cervical spine: Craniocervical junction within normal limits. Bone marrow signal intensity normal. No scalp soft tissue abnormality. Sinuses/Orbits: Patient status post ocular lens replacement on the right. Globes and orbital soft tissues demonstrate no acute finding. Paranasal sinuses are largely clear. Trace bilateral mastoid effusions, of doubtful significance. Inner ear structures grossly normal. Other: None. MRA HEAD FINDINGS ANTERIOR CIRCULATION: Examination degraded by motion artifact. Both internal carotid arteries patent to the termini without stenosis or other appreciable abnormality. A1 segments patent. Anterior communicating artery complex grossly within normal limits. Anterior cerebral arteries grossly patent to their distal aspects without appreciable stenosis. No obvious M1 stenosis. Grossly normal MCA  bifurcations. Distal MCA branches well perfused and symmetric. POSTERIOR CIRCULATION: Both V4 segments patent to the vertebrobasilar junction without stenosis. Left vertebral artery dominant. Left PICA origin patent and normal. Right PICA origin not definitely seen. Basilar patent to its distal aspect without stenosis. Superior cerebellar arteries patent bilaterally. Both PCAs primarily supplied via the basilar. PCAs perfused to their distal aspects without appreciable stenosis. No intracranial aneurysm. No vascular abnormality seen underlying the acute left thalamic hemorrhage. IMPRESSION: MRI HEAD IMPRESSION: 1. Motion degraded exam. 2. 3.0 x 3.1 x 3.4 cm acute intraparenchymal hemorrhage centered at the left thalamus. Surrounding vasogenic edema with mild regional mass effect and 5 mm of left-to-right shift. No visible underlying mass lesion or other structure abnormality on this motion degraded exam. 3. Associated intraventricular extension with blood within the left  greater than right lateral ventricles. No hydrocephalus or ventricular trapping at this time. 4. Underlying age-related cerebral atrophy with mild chronic small vessel ischemic disease, with a few small remote right cerebellar infarcts. MRA HEAD IMPRESSION: 1. Technically limited exam due to extensive motion artifact. 2. Grossly negative intracranial MRA. No large vessel occlusion, hemodynamically significant stenosis, or other acute vascular abnormality. No vascular abnormality seen underlying the acute left thalamic hemorrhage. Please note that while a MRA neck was also ordered for this exam, this was unable to be performed due to the patient's inability to tolerate the full length of the study and extensive motion artifact. Electronically Signed   By: Jeannine Boga M.D.   On: 01/23/2021 21:41   DG Chest Port 1 View  Result Date: 01/27/2021 CLINICAL DATA:  Code stroke EXAM: PORTABLE CHEST 1 VIEW COMPARISON:  01/26/2021 FINDINGS: Esophageal tube tip coiled within the proximal stomach. No focal opacity or pleural effusion. Stable cardiomediastinal silhouette with borderline cardiomegaly. No pneumothorax. IMPRESSION: No active disease. Electronically Signed   By: Donavan Foil M.D.   On: 01/27/2021 18:38   DG Chest Port 1 View  Result Date: 01/26/2021 CLINICAL DATA:  Fever, code stroke EXAM: PORTABLE CHEST 1 VIEW COMPARISON:  Radiograph 08/13/2011 FINDINGS: Unchanged cardiomediastinal silhouette. There is no focal airspace disease. There is no pleural effusion or visible pneumothorax. There is no acute osseous abnormality. There is a at weighted tip enteric tube overlying the stomach. IMPRESSION: No evidence of acute cardiopulmonary disease. Electronically Signed   By: Maurine Simmering   On: 01/26/2021 10:06   EEG adult  Result Date: 01/27/2021 Lora Havens, MD     01/27/2021 11:40 AM Patient Name: Brian [name] MRN: [id number] Epilepsy Attending: Lora Havens Referring Physician/Provider: Dr.  Antony Contras Date: 01/27/2021 Duration: 23.10 minutes Patient history: 85 year old male with left thalamic hemorrhage.  EEG to evaluate for seizures. Level of alertness: Awake AEDs during EEG study: Gabapentin Technical aspects: This EEG study was done with scalp electrodes positioned according to the 10-20 International system of electrode placement. Electrical activity was acquired at a sampling rate of 500Hz  and reviewed with a high frequency filter of 70Hz  and a low frequency filter of 1Hz . EEG data were recorded continuously and digitally stored. Description: The posterior dominant rhythm consists of 8 Hz activity of moderate voltage (25-35 uV) seen predominantly in posterior head regions, symmetric and reactive to eye opening and eye closing.  EEG showed continuous rhythmic sharply contoured 3 to 5 Hz theta- delta slowing in left frontotemporal region hyperventilation and photic stimulation were not performed.   ABNORMALITY -Continuous slow, left frontotemporal region IMPRESSION: This study is suggestive of cortical dysfunction in left frontotemporal  region likely secondary to underlying bleed. No seizures or definite epileptiform discharges were seen throughout the recording. Lora Havens   ECHOCARDIOGRAM COMPLETE  Result Date: 01/24/2021    ECHOCARDIOGRAM REPORT   Patient Name:   Brian [name] Date of Exam: 01/24/2021 Medical Rec #:  [id number]     Height:       72.0 in Accession #:    [id number]    Weight:       215.6 lb Date of Birth:  01-14-1936     BSA:          2.200 m Patient Age:    29 years      BP:           143/92 mmHg Patient Gender: M             HR:           77 bpm. Exam Location:  Inpatient Procedure: 2D Echo, Cardiac Doppler and Color Doppler Indications:    Stroke I63.9  History:        Patient has prior history of Echocardiogram examinations, most                 recent 04/02/2018. Stroke, Signs/Symptoms:Chest Pain; Risk                 Factors:Hypertension, Diabetes and Dyslipidemia.   Sonographer:    Tiffany Dance Referring Phys: [id number] [place]  Sonographer Comments: Suboptimal subcostal window. IMPRESSIONS  1. Left ventricular ejection fraction, by estimation, is 55 to 60%. The left ventricle has normal function. The left ventricle has no regional wall motion abnormalities. Left ventricular diastolic parameters are consistent with Grade I diastolic dysfunction (impaired relaxation).  2. Right ventricular systolic function is normal. The right ventricular size is normal.  3. Left atrial size was mildly dilated.  4. The mitral valve is normal in structure. No evidence of mitral valve regurgitation. No evidence of mitral stenosis.  5. The aortic valve is normal in structure. Aortic valve regurgitation is not visualized. Mild aortic valve sclerosis is present, with no evidence of aortic valve stenosis.  6. The inferior vena cava is normal in size with greater than 50% respiratory variability, suggesting right atrial pressure of 3 mmHg. Comparison(s): Prior images unable to be directly viewed, comparison made by report only. Conclusion(s)/Recommendation(s): No intracardiac source of embolism detected on this transthoracic study. Consider saline contrast study or TEE to evaluate the atrial septal aneurysm for evidence of PFO/shunting. FINDINGS  Left Ventricle: Left ventricular ejection fraction, by estimation, is 55 to 60%. The left ventricle has normal function. The left ventricle has no regional wall motion abnormalities. The left ventricular internal cavity size was normal in size. There is  borderline concentric left ventricular hypertrophy. Left ventricular diastolic parameters are consistent with Grade I diastolic dysfunction (impaired relaxation). Indeterminate filling pressures. Right Ventricle: The right ventricular size is normal. No increase in right ventricular wall thickness. Right ventricular systolic function is normal. Left Atrium: Left atrial size was mildly dilated. Right  Atrium: Right atrial size was normal in size. Pericardium: There is no evidence of pericardial effusion. Mitral Valve: The mitral valve is normal in structure. Mild mitral annular calcification. No evidence of mitral valve regurgitation. No evidence of mitral valve stenosis. Tricuspid Valve: The tricuspid valve is normal in structure. Tricuspid valve regurgitation is not demonstrated. No evidence of tricuspid stenosis. Aortic Valve: The aortic valve is normal in structure. Aortic valve regurgitation is not visualized. Mild aortic valve sclerosis  is present, with no evidence of aortic valve stenosis. Pulmonic Valve: The pulmonic valve was normal in structure. Pulmonic valve regurgitation is not visualized. No evidence of pulmonic stenosis. Aorta: The aortic root is normal in size and structure. Venous: The inferior vena cava is normal in size with greater than 50% respiratory variability, suggesting right atrial pressure of 3 mmHg. IAS/Shunts: The interatrial septum is aneurysmal. No atrial level shunt detected by color flow Doppler.  LEFT VENTRICLE PLAX 2D LVIDd:         4.00 cm     Diastology LVIDs:         3.00 cm     LV e' medial:    3.70 cm/s LV PW:         1.30 cm     LV E/e' medial:  14.9 LV IVS:        1.20 cm     LV e' lateral:   6.64 cm/s LVOT diam:     2.40 cm     LV E/e' lateral: 8.3 LV SV:         80 LV SV Index:   36 LVOT Area:     4.52 cm  LV Volumes (MOD) LV vol d, MOD A2C: 73.3 ml LV vol d, MOD A4C: 82.0 ml LV vol s, MOD A2C: 28.7 ml LV vol s, MOD A4C: 35.1 ml LV SV MOD A2C:     44.6 ml LV SV MOD A4C:     82.0 ml LV SV MOD BP:      46.3 ml RIGHT VENTRICLE             IVC RV Basal diam:  1.80 cm     IVC diam: 1.60 cm RV S prime:     13.50 cm/s TAPSE (M-mode): 2.3 cm LEFT ATRIUM             Index       RIGHT ATRIUM           Index LA diam:        4.00 cm 1.82 cm/m  RA Area:     12.10 cm LA Vol (A2C):   74.9 ml 34.05 ml/m RA Volume:   22.20 ml  10.09 ml/m LA Vol (A4C):   32.2 ml 14.64 ml/m LA  Biplane Vol: 50.7 ml 23.05 ml/m  AORTIC VALVE LVOT Vmax:   94.20 cm/s LVOT Vmean:  64.400 cm/s LVOT VTI:    0.176 m  AORTA Ao Root diam: 3.80 cm Ao Asc diam:  3.80 cm MITRAL VALVE MV Area (PHT): 3.12 cm    SHUNTS MV Decel Time: 243 msec    Systemic VTI:  0.18 m MV E velocity: 55.30 cm/s  Systemic Diam: 2.40 cm MV A velocity: 97.30 cm/s MV E/A ratio:  0.57 Mihai Croitoru MD Electronically signed by Sanda Klein MD Signature Date/Time: 01/24/2021/4:17:34 PM    Final    CT HEAD CODE STROKE WO CONTRAST  Result Date: 01/23/2021 CLINICAL DATA:  Code stroke.  Left-sided facial droop EXAM: CT HEAD WITHOUT CONTRAST TECHNIQUE: Contiguous axial images were obtained from the base of the skull through the vertex without intravenous contrast. COMPARISON:  2019 FINDINGS: Brain: There is acute hemorrhage centered within the left thalamus measuring 2.6 x 2.8 x 3 cm. Surrounding edema is present. There is partial effacement of the third ventricle. Intraventricular extension is present with hemorrhage seen within the left lateral and third ventricles. Gray-white differentiation is preserved. Patchy and confluent areas of hypoattenuation in the supratentorial  white matter are nonspecific but probably reflect moderate chronic microvascular ischemic changes. Small chronic right cerebellar infarct. Prominence of the ventricles and sulci reflects generalized parenchymal volume loss. Vascular: There is intracranial atherosclerotic calcification at the skull base. Skull: Unremarkable. Sinuses/Orbits: Paranasal sinus mucosal thickening. No acute orbital abnormality. Other: Mastoid air cells are clear. IMPRESSION: Acute left thalamic hemorrhage with mild regional mass effect and intraventricular extension. No hydrocephalus. Chronic microvascular ischemic changes. Small chronic right cerebellar infarct. Initial results were communicated to Dr. Lorrin Goodell at 3:42 pm on 01/23/2021 by text page via the [facility] messaging system. Electronically  Signed   By: Macy Mis M.D.   On: 01/23/2021 15:45    ECG & Cardiac Imaging    ECGs, personally reviewed: 5/13 17:59: NSR with first degree AVB, LAFB, and left atrial enlargement 5/13 17:27: Narrow complex SVT with short RP at a rate of 162 bpm with baseline LAFB.   TTE 03/2018:  Left ventricle: The cavity size was normal. There was moderate  concentric hypertrophy. Systolic function was mildly reduced. The  estimated ejection fraction was in the range of 45% to 50%. Mild  global hypokinesis that is slightly worse in the inferolateral  myocardium. Doppler parameters are consistent with abnormal left  ventricular relaxation (grade 1 diastolic dysfunction). Doppler  parameters are consistent with indeterminate ventricular filling  pressure.  - Aortic valve: Transvalvular velocity was within the normal range.  There was no stenosis. There was no regurgitation.  - Mitral valve: Transvalvular velocity was within the normal range.  There was no evidence for stenosis. There was no regurgitation.  - Left atrium: The atrium was moderately dilated.  - Right ventricle: The cavity size was normal. Wall thickness was  normal. Systolic function was normal.  - Right atrium: The atrium was moderately dilated.  - Atrial septum: No defect or patent foramen ovale was identified  by color flow Doppler.  - Tricuspid valve: There was trivial regurgitation.  - Pulmonary arteries: Systolic pressure was within the normal  range. PA peak pressure: 31 mm Hg (S).   TTE 01/24/2021: 1. Left ventricular ejection fraction, by estimation, is 55 to 60%. The  left ventricle has normal function. The left ventricle has no regional  wall motion abnormalities. Left ventricular diastolic parameters are  consistent with Grade I diastolic  dysfunction (impaired relaxation).  2. Right ventricular systolic function is normal. The right ventricular  size is normal.  3. Left atrial size was  mildly dilated.  4. The mitral valve is normal in structure. No evidence of mitral valve  regurgitation. No evidence of mitral stenosis.  5. The aortic valve is normal in structure. Aortic valve regurgitation is  not visualized. Mild aortic valve sclerosis is present, with no evidence  of aortic valve stenosis.  6. The inferior vena cava is normal in size with greater than 50%  respiratory variability, suggesting right atrial pressure of 3 mmHg.   Event monitor 05/26/2018: Predominant rhythm is sinus rhythm Multiple episodes of supraventricular tachycardia are noted (17) There is also a single episode of atrial fibrillation observed   Assessment & Plan    Supraventricular tachycardia: Narrow complex, regular, short RP with probable retrograde conduction, adenosine responsive. 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 He has normal LV, RV, and valvular function, and has tolerated these episodes well. It looks like he had similar SVTs detected with prior outpatient monitoring in 2019, though episodes were much more brief. A heightened adrenergic state is probably contributing significantly to the current episodes.  He also has a diseased left atrium and the episodes are all initiated by PACs, so suppression of atrial ectopy should be effective in preventing these episodes.  - Continue carvedilol with continued up-titration as tolerated. - Agree with continuing amiodarone while inpatient. This can be transitioned to oral dosing, 400mg  BID by NGT. Recommend a 30 day course at discharge while he recovers from this acute illness.  - As he is unable to perform vagal maneuvers, adenosine can be given for more prolonged episodes lasting > 10-15 minutes. He should be able to tolerate shorter episodes well without any issue.  - [place] at discharge with cardiology follow-up for further management.  History of paroxysmal atrial fibrillation: CHADS2-VASc is 6 with history of ischemic stroke and now [place]  for which [place] has been discontinued. His AF burden is unclear, but he has not had any occurrence during this hospitalization and it seems to mostly asymptomatic. Agree with continued rate control. Defer anticoagulation decision to neurology.  Signed, Marykay Lex, MD 01/29/2021, 10:30 PM  For questions or updates, please contact   Please consult [email or web address] for contact info under Cardiology/STEMI.

## 2021-01-29 NOTE — Progress Notes (Signed)
NT suctioned pt per MD request x1 through each nare. Pt very resistant and pushes RT's hand away with Left hand forcefully. Small amount of blood and tan secretions removed.

## 2021-01-29 NOTE — Consult Note (Incomplete)
Cardiology Consult    Patient ID: Brian [name] MRN: [id number], DOB/AGE: Sep 25, 1935   Admit date: 01/23/2021 Date of Consult: 01/29/2021  Primary Physician: [name], MD Primary Cardiologist: Dorris Carnes, MD Requesting Provider: ***  Patient Profile    Brian [name] is a 85 y.o. male with a history of paroxysmal AF, recurrent stroke, type 2 DM, and HTN. He is admitted for a thalamic hemorrhage and is being seen today for the evaluation of paroxysmal tachycardia.   History of Present Illness    The patient is aphasic. History obtain from chart, review of telemetry, RN, and neurology team.   He was admitted May 9 as code stroke after he was noted to have dysarthria, facial droop, and right sided weakness while at a car wash. CT and MRI revealed a left thalamic ICH with cytoxic edema associated with mild 11mm left shift. He was given Andexxa for apixaban reversal and admitted to the ICU on clevidipine. From a neurologic standpoint, he has remain stable, though still essentially aphasic. Failed swallow eval and Cortrak has been placed. He is off clevidipine and transitioned to oral/home antihypertensives, including beta blocker. He is on broad spectrum antibiotic due to low-grade fevers that have resolved. EEG showed focal left-sided slowing but no seizures.  Otherwise, he has had intermittent tachycardia since admission at a rate of 140-150. On my review of telemetry, he had brief episodes of a regular, narrow complex, short RP tachycardia lasting only a few minutes on 5/10 and 5/12, both initiating and terminating spontaneously with PACs. Then 5/13 there is an episode at a rate of about 160 lasting approximately 1 hour for which he was given 3 doses of adenosine with rapid conversion back to sinus rhythm with the third dose. He was started on low-dose metoprolol via NGT that has since been transitioned to carvedilol. Since the prolonged episode, they have become somewhat more frequent,  occurring twice on 5/14 and now for the third time today, though all of these episodes have lasted 36min or less. He had been started on an amiodarone infusion on 5/14, currently at 30mg /hr, and carvedilol was increased back to 12.5mg  this afternoon. His blood pressure with these episodes by cuff pressure tends to drop from the 150s/160s to the 671I systolic, but he does not display any overt symptoms according to his team. I do not see that he has any history of an SVT other than atrial fibrillation. Cardiology was consulted for further recommendations after the most recent episode this evening lasting 11 min. Of note, his echo during this admission showed mild left atrial enlargement but was otherwise normal. Baseline ECG shows NSR with first degree AVB and LAFB. Regarding his AF history, it appears this is paroxysmal and has been treated with rate control only.  Past Medical History   Past Medical History:  Diagnosis Date  . ALLERGIC RHINITIS 10/13/2007  . ANEMIA, CHRONIC DISEASE NEC 03/31/2007  . BENIGN PROSTATIC HYPERTROPHY 03/31/2007  . Chest pain    Stress echo, normal, December, 2012  . Chronic anemia   . DIABETES MELLITUS, TYPE II 03/31/2007  . [place] DISEASE, LUMBAR 03/31/2007  . ED (erectile dysfunction)   . Ejection fraction    EF  normal, stress echo, December,  2012  . History of prostatitis   . HYPERLIPIDEMIA 03/31/2007  . HYPERTENSION 03/31/2007  . Nephrolithiasis 12/07/2010  . Obesity   . POLYP, ANAL AND RECTAL 03/31/2007  . Right eye trauma    hx as child  .  Stroke [facility])     Past Surgical History:  Procedure Laterality Date  . [place] SURGERY     1999  . PROSTATE BIOPSY     s/p  . RECTAL POLYPECTOMY     Transanal excision 10/2005  . SKIN BIOPSY     s/p right upper back 2009- benign Dr. Tonia Brooms     No Known Allergies Inpatient Medications    .  stroke: mapping our early stages of recovery book   Does not apply Once  . amiodarone  150 mg Intravenous Once  .  amLODipine  10 mg Per Tube Daily  . carvedilol  12.5 mg Per Tube BID WC  . chlorhexidine  15 mL Mouth Rinse BID  . Chlorhexidine Gluconate Cloth  6 each Topical Daily  . enoxaparin (LOVENOX) injection  40 mg Subcutaneous Q24H  . feeding supplement (PROSource TF)  45 mL Per Tube TID  . gabapentin  100 mg Per Tube TID  . insulin aspart  3-9 Units Subcutaneous Q4H  . insulin aspart  5 Units Subcutaneous Q4H  . insulin detemir  15 Units Subcutaneous BID  . losartan  50 mg Per Tube BID  . mouth rinse  15 mL Mouth Rinse q12n4p  . pantoprazole sodium  40 mg Per Tube QHS  . senna-docusate  1 tablet Per Tube BID  . sodium chloride flush  3 mL Intravenous Once    Family History    Family History  Problem Relation Age of Onset  . Colon polyps Mother   . Stroke Mother        due to ICA stenosis  . CVA Neg Hx   . Seizures Neg Hx    He indicated that the status of his mother is unknown. He indicated that the status of his father is unknown. He indicated that his maternal grandmother is deceased. He indicated that his maternal grandfather is deceased. He indicated that his paternal grandmother is deceased. He indicated that his paternal grandfather is deceased. He indicated that the status of his neg hx is unknown.   Social History    Social History   Socioeconomic History  . Marital status: Widowed    Spouse name: Not on file  . Number of children: 2  . Years of education: Not on file  . Highest education level: Not on file  Occupational History  . Occupation: Acupuncturist    Comment: Retired 1999  Tobacco Use  . Smoking status: Former Smoker    Years: 10.00    Quit date: 1995    Years since quitting: 27.3  . Smokeless tobacco: Never Used  Vaping Use  . Vaping Use: Never used  Substance and Sexual Activity  . Alcohol use: Yes    Alcohol/week: 1.0 standard drink    Types: 1 Cans of beer per week    Comment: occasional or yearly  . Drug use: No  . Sexual activity:  Not on file  Other Topics Concern  . Not on file  Social History Narrative  . Not on file   Social Determinants of Health   Financial Resource Strain: Not on file  Food Insecurity: Not on file  Transportation Needs: Not on file  Physical Activity: Not on file  Stress: Not on file  Social Connections: Not on file  Intimate Partner Violence: Not on file     Review of Systems    General:  No chills, fever, night sweats or weight changes.  Cardiovascular:  No chest pain,  dyspnea on exertion, edema, orthopnea, palpitations, paroxysmal nocturnal dyspnea. Dermatological: No rash, lesions/masses Respiratory: No cough, dyspnea Urologic: No hematuria, dysuria Abdominal:   No nausea, vomiting, diarrhea, bright red blood per rectum, melena, or hematemesis Neurologic:  No visual changes, wkns, changes in mental status. All other systems reviewed and are otherwise negative except as noted above.  Physical Exam    Blood pressure (!) 147/69, pulse 79, temperature 99.1 F (37.3 C), temperature source Oral, resp. rate 11, height 6' (1.829 m), weight 104.3 kg, SpO2 93 %.     Intake/Output Summary (Last 24 hours) at 01/29/2021 2230 Last data filed at 01/29/2021 2200 Gross per 24 hour  Intake 2280.21 ml  Output 1650 ml  Net 630.21 ml   Wt Readings from Last 3 Encounters:  01/29/21 104.3 kg  01/03/21 99.3 kg  12/06/20 100.8 kg    CONSTITUTIONAL: alert and conversant, well-appearing, nourished, no distress HEENT: oropharynx clear and moist, no mucosal lesions, normal dentition, conjunctiva normal, EOM intact, pupils equal, no lid lag. NECK: supple, no cervical adenopathy, no thyromegaly CARDIOVASCULAR: Regular rhythm. No gallop, murmur, or rub. Normal S1/S2. Radial pulses intact. JVP ***. No carotid bruits. PULMONARY/CHEST WALL: no deformities, normal breath sounds bilaterally, normal work of breathing ABDOMINAL: soft, non-tender, non-distended EXTREMITIES: no edema or muscle atrophy,  warm and well-perfused SKIN: Dry and intact without apparent rashes or wounds. Wound: {Exam; wound:21617} NEUROLOGIC: alert, normal gait, no abnormal movements, cranial nerves grossly intact.   Labs    Troponin (Point of Care Test) No results for input(s): TROPIPOC in the last 72 hours. No results for input(s): CKTOTAL, CKMB, TROPONINI in the last 72 hours. Lab Results  Component Value Date   WBC 6.8 01/29/2021   HGB 10.0 (L) 01/29/2021   HCT 33.8 (L) 01/29/2021   MCV 87.3 01/29/2021   PLT 198 01/29/2021    Recent Labs  Lab 01/27/21 1737 01/27/21 2016 01/29/21 0640 01/29/21 1325 01/29/21 1852  NA 142   < > 152*   < > 150*  K 3.9   < > 3.7  --   --   CL 110   < > 123*  --   --   CO2 21*   < > 22  --   --   BUN 34*   < > 25*  --   --   CREATININE 1.11   < > 0.90  --   --   CALCIUM 9.1   < > 9.4  --   --   PROT 6.7  --   --   --   --   BILITOT 1.1  --   --   --   --   ALKPHOS 52  --   --   --   --   ALT 21  --   --   --   --   AST 34  --   --   --   --   GLUCOSE 216*   < > 192*  --   --    < > = values in this interval not displayed.   Lab Results  Component Value Date   CHOL 113 01/28/2021   HDL 41 01/28/2021   LDLCALC 64 01/28/2021   TRIG 41 01/28/2021   No results found for: [facility]   Radiology Studies    CT HEAD WO CONTRAST  Result Date: 01/29/2021 CLINICAL DATA:  Cerebral hemorrhage suspected EXAM: CT HEAD WITHOUT CONTRAST TECHNIQUE: Contiguous axial images were obtained from the base of  the skull through the vertex without intravenous contrast. COMPARISON:  Two days ago FINDINGS: Brain: Acute hematoma centered in the left thalamus and adjacent white matter, 3.1 cm in maximal span on axial images, unchanged when remeasured in a similar fashion. Intraventricular hemorrhage seen at the lateral ventricles with stable mild ventriculomegaly. Chronic small vessel ischemic type low-density in the hemispheric white matter. No evidence of acute cortical infarct. Small  remote right cerebellar infarct. Regional mass effect from the hematoma is unchanged. Vascular: Atheromatous calcification Skull: Negative Sinuses/Orbits: Nasal intubation. IMPRESSION: 1. No progression of the left thalamic or intraventricular hemorrhage. 2. Unchanged ventriculomegaly. Electronically Signed   By: Monte Fantasia M.D.   On: 01/29/2021 10:21   CT Head Wo Contrast  Result Date: 01/27/2021 CLINICAL DATA:  Follow-up hemorrhagic stroke involving the LEFT thalamus with extension into the ventricular system. EXAM: CT HEAD WITHOUT CONTRAST TECHNIQUE: Contiguous axial images were obtained from the base of the skull through the vertex without intravenous contrast. COMPARISON:  01/25/2021 and earlier. FINDINGS: Brain: Hematoma involving the LEFT thalamus measuring approximately 2.6 x 2.8 x 2.8 cm, unchanged in size since the initial 01/23/2021 CT. Interval decrease in the amount of intraventricular hemorrhage in the lateral ventricles and the third ventricle. Mass-effect with midline shift of approximately 7 mm to the RIGHT, unchanged. No new hemorrhage or hematoma. No extra-axial fluid collections. Vascular: Mild BILATERAL carotid siphon and vertebral artery atherosclerosis. Hyperdense vessel. Skull: No skull fracture or other focal osseous abnormality involving the skull. Sinuses/Orbits: Paranasal sinuses, mastoid air cells and middle ear cavities well-aerated. Benign senile scleral calcifications in both eyes. Other: None. IMPRESSION: 1. Stable LEFT thalamic hematoma and associated midline shift to the RIGHT of approximately 7 mm. 2. Decreasing intraventricular hemorrhage. 3. No new intracranial abnormality. Electronically Signed   By: Evangeline Dakin M.D.   On: 01/27/2021 10:55   CT HEAD WO CONTRAST  Result Date: 01/25/2021 CLINICAL DATA:  Follow-up examination for acute stroke. EXAM: CT HEAD WITHOUT CONTRAST TECHNIQUE: Contiguous axial images were obtained from the base of the skull through the  vertex without intravenous contrast. COMPARISON:  Prior CT from 01/23/2021. FINDINGS: Brain: Intraparenchymal hemorrhage centered at the left thalamus is not significantly changed in size or morphology as compared to previous exam. Surrounding edema is stable to mildly increased. Similar regional mass effect with localized left-to-right shift. Intraventricular extension with blood within the lateral and third ventricles, relatively stable. Stable ventricular size and morphology without hydrocephalus or trapping. Basilar cisterns remain patent. No new intracranial hemorrhage or large vessel territory infarct. No extra-axial collection. Underlying atrophy with chronic microvascular ischemic disease again noted. Small remote right cerebellar infarcts noted as well. Vascular: No hyperdense vessel. Scattered vascular calcifications noted within the carotid siphons. Skull: Scalp soft tissues and calvarium demonstrate no acute finding. Sinuses/Orbits: Globes and orbital soft tissues demonstrate no acute finding. Mild scattered mucosal thickening noted within the ethmoidal air cells and maxillary sinuses. Paranasal sinuses are otherwise clear. Mastoid air cells remain largely clear. Other: None. IMPRESSION: 1. No significant interval change in size and morphology of left thalamic hemorrhage. Surrounding edema has mildly increased from prior, although regional mass effect and localized left-to-right shift is not significantly changed. 2. Intraventricular extension with blood within the lateral and third ventricles, stable. Stable ventricular size and morphology without hydrocephalus or trapping. 3. No other new acute intracranial abnormality. Electronically Signed   By: Jeannine Boga M.D.   On: 01/25/2021 04:19   CT HEAD WO CONTRAST  Result Date: 01/23/2021 CLINICAL DATA:  Follow-up intracranial hemorrhage EXAM: CT HEAD WITHOUT CONTRAST TECHNIQUE: Contiguous axial images were obtained from the base of the skull  through the vertex without intravenous contrast. COMPARISON:  CT from earlier in the same day. FINDINGS: Brain: Parenchymal hemorrhage is again noted in the region of the left thalamus. Some surrounding edema is noted. The hematoma measures approximately 3.0 x 2.4 cm and roughly stable from the prior exam. Some intraventricular component is again noted particularly in the left lateral ventricle. Chronic white matter ischemic changes are noted. No new focal hemorrhage is seen. Chronic right cerebellar infarct is noted. Vascular: No hyperdense vessel or unexpected calcification. Skull: Normal. Negative for fracture or focal lesion. Sinuses/Orbits: No acute finding. Other: None. IMPRESSION: Stable appearing left thalamic hemorrhage with intraventricular component. No new focal abnormality is noted. Electronically Signed   By: Inez Catalina M.D.   On: 01/23/2021 21:54   MR MRA HEAD WO CONTRAST  Result Date: 01/23/2021 CLINICAL DATA:  Initial evaluation for acute intracranial hemorrhage, headache. EXAM: MRI HEAD WITHOUT CONTRAST MRA HEAD WITHOUT CONTRAST TECHNIQUE: Multiplanar, multi-echo pulse sequences of the brain and surrounding structures were acquired without intravenous contrast. Angiographic images of the Circle of Willis were acquired using MRA technique without intravenous contrast. COMPARISON: No pertinent prior exam. COMPARISON:  Prior CT from earlier the same day as well as previous MRI from 06/23/2018. FINDINGS: MRI HEAD FINDINGS Brain: Examination moderately to severely degraded by motion artifact. Generalized age-related cerebral atrophy. Patchy and confluent T2/FLAIR hyperintensity within the periventricular and deep white matter both cerebral hemispheres most consistent with chronic small vessel ischemic disease. Few small remote right cerebellar infarcts noted. Previously identified acute intraparenchymal hemorrhage centered at the left thalamus again seen. Bleed measures 3.0 x 3.1 x 3.4 cm on this  motion degraded exam (estimated volume 16 cc). No visible underlying mass lesion or other structure abnormality on this motion degraded noncontrast examination. Surrounding vasogenic edema with mild regional mass effect. Associated mild localized left-to-right shift measures up to 5 mm at the septum pellucidum. Associated intraventricular extension with blood within the left greater than right lateral ventricles. No hydrocephalus or ventricular trapping at this time. No other evidence for acute intracranial hemorrhage. No other foci of susceptibility artifact to suggest chronic hemorrhage elsewhere within the brain. No other foci of restricted diffusion to suggest acute or subacute ischemia. Gray-white matter differentiation otherwise maintained. No mass lesion or mass effect elsewhere within the brain. No extra-axial fluid collection. Pituitary gland suprasellar region within normal limits. Vascular: Major intracranial vascular flow voids are maintained. Skull and upper cervical spine: Craniocervical junction within normal limits. Bone marrow signal intensity normal. No scalp soft tissue abnormality. Sinuses/Orbits: Patient status post ocular lens replacement on the right. Globes and orbital soft tissues demonstrate no acute finding. Paranasal sinuses are largely clear. Trace bilateral mastoid effusions, of doubtful significance. Inner ear structures grossly normal. Other: None. MRA HEAD FINDINGS ANTERIOR CIRCULATION: Examination degraded by motion artifact. Both internal carotid arteries patent to the termini without stenosis or other appreciable abnormality. A1 segments patent. Anterior communicating artery complex grossly within normal limits. Anterior cerebral arteries grossly patent to their distal aspects without appreciable stenosis. No obvious M1 stenosis. Grossly normal MCA bifurcations. Distal MCA branches well perfused and symmetric. POSTERIOR CIRCULATION: Both V4 segments patent to the vertebrobasilar  junction without stenosis. Left vertebral artery dominant. Left PICA origin patent and normal. Right PICA origin not definitely seen. Basilar patent to its distal aspect without stenosis. Superior cerebellar arteries patent bilaterally. Both PCAs primarily  supplied via the basilar. PCAs perfused to their distal aspects without appreciable stenosis. No intracranial aneurysm. No vascular abnormality seen underlying the acute left thalamic hemorrhage. IMPRESSION: MRI HEAD IMPRESSION: 1. Motion degraded exam. 2. 3.0 x 3.1 x 3.4 cm acute intraparenchymal hemorrhage centered at the left thalamus. Surrounding vasogenic edema with mild regional mass effect and 5 mm of left-to-right shift. No visible underlying mass lesion or other structure abnormality on this motion degraded exam. 3. Associated intraventricular extension with blood within the left greater than right lateral ventricles. No hydrocephalus or ventricular trapping at this time. 4. Underlying age-related cerebral atrophy with mild chronic small vessel ischemic disease, with a few small remote right cerebellar infarcts. MRA HEAD IMPRESSION: 1. Technically limited exam due to extensive motion artifact. 2. Grossly negative intracranial MRA. No large vessel occlusion, hemodynamically significant stenosis, or other acute vascular abnormality. No vascular abnormality seen underlying the acute left thalamic hemorrhage. Please note that while a MRA neck was also ordered for this exam, this was unable to be performed due to the patient's inability to tolerate the full length of the study and extensive motion artifact. Electronically Signed   By: Jeannine Boga M.D.   On: 01/23/2021 21:41   MR ANGIO NECK W WO CONTRAST  Result Date: 01/23/2021 CLINICAL DATA:  Initial evaluation for acute intracranial hemorrhage, headache. EXAM: MRI HEAD WITHOUT CONTRAST MRA HEAD WITHOUT CONTRAST TECHNIQUE: Multiplanar, multi-echo pulse sequences of the brain and surrounding  structures were acquired without intravenous contrast. Angiographic images of the Circle of Willis were acquired using MRA technique without intravenous contrast. COMPARISON: No pertinent prior exam. COMPARISON:  Prior CT from earlier the same day as well as previous MRI from 06/23/2018. FINDINGS: MRI HEAD FINDINGS Brain: Examination moderately to severely degraded by motion artifact. Generalized age-related cerebral atrophy. Patchy and confluent T2/FLAIR hyperintensity within the periventricular and deep white matter both cerebral hemispheres most consistent with chronic small vessel ischemic disease. Few small remote right cerebellar infarcts noted. Previously identified acute intraparenchymal hemorrhage centered at the left thalamus again seen. Bleed measures 3.0 x 3.1 x 3.4 cm on this motion degraded exam (estimated volume 16 cc). No visible underlying mass lesion or other structure abnormality on this motion degraded noncontrast examination. Surrounding vasogenic edema with mild regional mass effect. Associated mild localized left-to-right shift measures up to 5 mm at the septum pellucidum. Associated intraventricular extension with blood within the left greater than right lateral ventricles. No hydrocephalus or ventricular trapping at this time. No other evidence for acute intracranial hemorrhage. No other foci of susceptibility artifact to suggest chronic hemorrhage elsewhere within the brain. No other foci of restricted diffusion to suggest acute or subacute ischemia. Gray-white matter differentiation otherwise maintained. No mass lesion or mass effect elsewhere within the brain. No extra-axial fluid collection. Pituitary gland suprasellar region within normal limits. Vascular: Major intracranial vascular flow voids are maintained. Skull and upper cervical spine: Craniocervical junction within normal limits. Bone marrow signal intensity normal. No scalp soft tissue abnormality. Sinuses/Orbits: Patient status  post ocular lens replacement on the right. Globes and orbital soft tissues demonstrate no acute finding. Paranasal sinuses are largely clear. Trace bilateral mastoid effusions, of doubtful significance. Inner ear structures grossly normal. Other: None. MRA HEAD FINDINGS ANTERIOR CIRCULATION: Examination degraded by motion artifact. Both internal carotid arteries patent to the termini without stenosis or other appreciable abnormality. A1 segments patent. Anterior communicating artery complex grossly within normal limits. Anterior cerebral arteries grossly patent to their distal aspects without appreciable stenosis. No  obvious M1 stenosis. Grossly normal MCA bifurcations. Distal MCA branches well perfused and symmetric. POSTERIOR CIRCULATION: Both V4 segments patent to the vertebrobasilar junction without stenosis. Left vertebral artery dominant. Left PICA origin patent and normal. Right PICA origin not definitely seen. Basilar patent to its distal aspect without stenosis. Superior cerebellar arteries patent bilaterally. Both PCAs primarily supplied via the basilar. PCAs perfused to their distal aspects without appreciable stenosis. No intracranial aneurysm. No vascular abnormality seen underlying the acute left thalamic hemorrhage. IMPRESSION: MRI HEAD IMPRESSION: 1. Motion degraded exam. 2. 3.0 x 3.1 x 3.4 cm acute intraparenchymal hemorrhage centered at the left thalamus. Surrounding vasogenic edema with mild regional mass effect and 5 mm of left-to-right shift. No visible underlying mass lesion or other structure abnormality on this motion degraded exam. 3. Associated intraventricular extension with blood within the left greater than right lateral ventricles. No hydrocephalus or ventricular trapping at this time. 4. Underlying age-related cerebral atrophy with mild chronic small vessel ischemic disease, with a few small remote right cerebellar infarcts. MRA HEAD IMPRESSION: 1. Technically limited exam due to  extensive motion artifact. 2. Grossly negative intracranial MRA. No large vessel occlusion, hemodynamically significant stenosis, or other acute vascular abnormality. No vascular abnormality seen underlying the acute left thalamic hemorrhage. Please note that while a MRA neck was also ordered for this exam, this was unable to be performed due to the patient's inability to tolerate the full length of the study and extensive motion artifact. Electronically Signed   By: Jeannine Boga M.D.   On: 01/23/2021 21:41   MR BRAIN WO CONTRAST  Result Date: 01/23/2021 CLINICAL DATA:  Initial evaluation for acute intracranial hemorrhage, headache. EXAM: MRI HEAD WITHOUT CONTRAST MRA HEAD WITHOUT CONTRAST TECHNIQUE: Multiplanar, multi-echo pulse sequences of the brain and surrounding structures were acquired without intravenous contrast. Angiographic images of the Circle of Willis were acquired using MRA technique without intravenous contrast. COMPARISON: No pertinent prior exam. COMPARISON:  Prior CT from earlier the same day as well as previous MRI from 06/23/2018. FINDINGS: MRI HEAD FINDINGS Brain: Examination moderately to severely degraded by motion artifact. Generalized age-related cerebral atrophy. Patchy and confluent T2/FLAIR hyperintensity within the periventricular and deep white matter both cerebral hemispheres most consistent with chronic small vessel ischemic disease. Few small remote right cerebellar infarcts noted. Previously identified acute intraparenchymal hemorrhage centered at the left thalamus again seen. Bleed measures 3.0 x 3.1 x 3.4 cm on this motion degraded exam (estimated volume 16 cc). No visible underlying mass lesion or other structure abnormality on this motion degraded noncontrast examination. Surrounding vasogenic edema with mild regional mass effect. Associated mild localized left-to-right shift measures up to 5 mm at the septum pellucidum. Associated intraventricular extension with  blood within the left greater than right lateral ventricles. No hydrocephalus or ventricular trapping at this time. No other evidence for acute intracranial hemorrhage. No other foci of susceptibility artifact to suggest chronic hemorrhage elsewhere within the brain. No other foci of restricted diffusion to suggest acute or subacute ischemia. Gray-white matter differentiation otherwise maintained. No mass lesion or mass effect elsewhere within the brain. No extra-axial fluid collection. Pituitary gland suprasellar region within normal limits. Vascular: Major intracranial vascular flow voids are maintained. Skull and upper cervical spine: Craniocervical junction within normal limits. Bone marrow signal intensity normal. No scalp soft tissue abnormality. Sinuses/Orbits: Patient status post ocular lens replacement on the right. Globes and orbital soft tissues demonstrate no acute finding. Paranasal sinuses are largely clear. Trace bilateral mastoid effusions, of doubtful  significance. Inner ear structures grossly normal. Other: None. MRA HEAD FINDINGS ANTERIOR CIRCULATION: Examination degraded by motion artifact. Both internal carotid arteries patent to the termini without stenosis or other appreciable abnormality. A1 segments patent. Anterior communicating artery complex grossly within normal limits. Anterior cerebral arteries grossly patent to their distal aspects without appreciable stenosis. No obvious M1 stenosis. Grossly normal MCA bifurcations. Distal MCA branches well perfused and symmetric. POSTERIOR CIRCULATION: Both V4 segments patent to the vertebrobasilar junction without stenosis. Left vertebral artery dominant. Left PICA origin patent and normal. Right PICA origin not definitely seen. Basilar patent to its distal aspect without stenosis. Superior cerebellar arteries patent bilaterally. Both PCAs primarily supplied via the basilar. PCAs perfused to their distal aspects without appreciable stenosis. No  intracranial aneurysm. No vascular abnormality seen underlying the acute left thalamic hemorrhage. IMPRESSION: MRI HEAD IMPRESSION: 1. Motion degraded exam. 2. 3.0 x 3.1 x 3.4 cm acute intraparenchymal hemorrhage centered at the left thalamus. Surrounding vasogenic edema with mild regional mass effect and 5 mm of left-to-right shift. No visible underlying mass lesion or other structure abnormality on this motion degraded exam. 3. Associated intraventricular extension with blood within the left greater than right lateral ventricles. No hydrocephalus or ventricular trapping at this time. 4. Underlying age-related cerebral atrophy with mild chronic small vessel ischemic disease, with a few small remote right cerebellar infarcts. MRA HEAD IMPRESSION: 1. Technically limited exam due to extensive motion artifact. 2. Grossly negative intracranial MRA. No large vessel occlusion, hemodynamically significant stenosis, or other acute vascular abnormality. No vascular abnormality seen underlying the acute left thalamic hemorrhage. Please note that while a MRA neck was also ordered for this exam, this was unable to be performed due to the patient's inability to tolerate the full length of the study and extensive motion artifact. Electronically Signed   By: Jeannine Boga M.D.   On: 01/23/2021 21:41   DG Chest Port 1 View  Result Date: 01/27/2021 CLINICAL DATA:  Code stroke EXAM: PORTABLE CHEST 1 VIEW COMPARISON:  01/26/2021 FINDINGS: Esophageal tube tip coiled within the proximal stomach. No focal opacity or pleural effusion. Stable cardiomediastinal silhouette with borderline cardiomegaly. No pneumothorax. IMPRESSION: No active disease. Electronically Signed   By: Donavan Foil M.D.   On: 01/27/2021 18:38   DG Chest Port 1 View  Result Date: 01/26/2021 CLINICAL DATA:  Fever, code stroke EXAM: PORTABLE CHEST 1 VIEW COMPARISON:  Radiograph 08/13/2011 FINDINGS: Unchanged cardiomediastinal silhouette. There is no  focal airspace disease. There is no pleural effusion or visible pneumothorax. There is no acute osseous abnormality. There is a at weighted tip enteric tube overlying the stomach. IMPRESSION: No evidence of acute cardiopulmonary disease. Electronically Signed   By: Maurine Simmering   On: 01/26/2021 10:06   EEG adult  Result Date: 01/27/2021 Lora Havens, MD     01/27/2021 11:40 AM Patient Name: COURVOISIER VREDEVELD MRN: [id number] Epilepsy Attending: Lora Havens Referring Physician/Provider: Dr. Antony Contras Date: 01/27/2021 Duration: 23.10 minutes Patient history: 85 year old male with left thalamic hemorrhage.  EEG to evaluate for seizures. Level of alertness: Awake AEDs during EEG study: Gabapentin Technical aspects: This EEG study was done with scalp electrodes positioned according to the 10-20 International system of electrode placement. Electrical activity was acquired at a sampling rate of 500Hz  and reviewed with a high frequency filter of 70Hz  and a low frequency filter of 1Hz . EEG data were recorded continuously and digitally stored. Description: The posterior dominant rhythm consists of 8 Hz activity of moderate  voltage (25-35 uV) seen predominantly in posterior head regions, symmetric and reactive to eye opening and eye closing.  EEG showed continuous rhythmic sharply contoured 3 to 5 Hz theta- delta slowing in left frontotemporal region hyperventilation and photic stimulation were not performed.   ABNORMALITY -Continuous slow, left frontotemporal region IMPRESSION: This study is suggestive of cortical dysfunction in left frontotemporal region likely secondary to underlying bleed. No seizures or definite epileptiform discharges were seen throughout the recording. Lora Havens   ECHOCARDIOGRAM COMPLETE  Result Date: 01/24/2021    ECHOCARDIOGRAM REPORT   Patient Name:   LIAHM TREMONTI Date of Exam: 01/24/2021 Medical Rec #:  [id number]     Height:       72.0 in Accession #:    [id number]    Weight:        215.6 lb Date of Birth:  1935/11/02     BSA:          2.200 m Patient Age:    42 years      BP:           143/92 mmHg Patient Gender: M             HR:           77 bpm. Exam Location:  Inpatient Procedure: 2D Echo, Cardiac Doppler and Color Doppler Indications:    Stroke I63.9  History:        Patient has prior history of Echocardiogram examinations, most                 recent 04/02/2018. Stroke, Signs/Symptoms:Chest Pain; Risk                 Factors:Hypertension, Diabetes and Dyslipidemia.  Sonographer:    Tiffany Dance Referring Phys: [id number] [place]  Sonographer Comments: Suboptimal subcostal window. IMPRESSIONS  1. Left ventricular ejection fraction, by estimation, is 55 to 60%. The left ventricle has normal function. The left ventricle has no regional wall motion abnormalities. Left ventricular diastolic parameters are consistent with Grade I diastolic dysfunction (impaired relaxation).  2. Right ventricular systolic function is normal. The right ventricular size is normal.  3. Left atrial size was mildly dilated.  4. The mitral valve is normal in structure. No evidence of mitral valve regurgitation. No evidence of mitral stenosis.  5. The aortic valve is normal in structure. Aortic valve regurgitation is not visualized. Mild aortic valve sclerosis is present, with no evidence of aortic valve stenosis.  6. The inferior vena cava is normal in size with greater than 50% respiratory variability, suggesting right atrial pressure of 3 mmHg. Comparison(s): Prior images unable to be directly viewed, comparison made by report only. Conclusion(s)/Recommendation(s): No intracardiac source of embolism detected on this transthoracic study. Consider saline contrast study or TEE to evaluate the atrial septal aneurysm for evidence of PFO/shunting. FINDINGS  Left Ventricle: Left ventricular ejection fraction, by estimation, is 55 to 60%. The left ventricle has normal function. The left ventricle has no regional  wall motion abnormalities. The left ventricular internal cavity size was normal in size. There is  borderline concentric left ventricular hypertrophy. Left ventricular diastolic parameters are consistent with Grade I diastolic dysfunction (impaired relaxation). Indeterminate filling pressures. Right Ventricle: The right ventricular size is normal. No increase in right ventricular wall thickness. Right ventricular systolic function is normal. Left Atrium: Left atrial size was mildly dilated. Right Atrium: Right atrial size was normal in size. Pericardium: There is no evidence of pericardial effusion. Mitral Valve:  The mitral valve is normal in structure. Mild mitral annular calcification. No evidence of mitral valve regurgitation. No evidence of mitral valve stenosis. Tricuspid Valve: The tricuspid valve is normal in structure. Tricuspid valve regurgitation is not demonstrated. No evidence of tricuspid stenosis. Aortic Valve: The aortic valve is normal in structure. Aortic valve regurgitation is not visualized. Mild aortic valve sclerosis is present, with no evidence of aortic valve stenosis. Pulmonic Valve: The pulmonic valve was normal in structure. Pulmonic valve regurgitation is not visualized. No evidence of pulmonic stenosis. Aorta: The aortic root is normal in size and structure. Venous: The inferior vena cava is normal in size with greater than 50% respiratory variability, suggesting right atrial pressure of 3 mmHg. IAS/Shunts: The interatrial septum is aneurysmal. No atrial level shunt detected by color flow Doppler.  LEFT VENTRICLE PLAX 2D LVIDd:         4.00 cm     Diastology LVIDs:         3.00 cm     LV e' medial:    3.70 cm/s LV PW:         1.30 cm     LV E/e' medial:  14.9 LV IVS:        1.20 cm     LV e' lateral:   6.64 cm/s LVOT diam:     2.40 cm     LV E/e' lateral: 8.3 LV SV:         80 LV SV Index:   36 LVOT Area:     4.52 cm  LV Volumes (MOD) LV vol d, MOD A2C: 73.3 ml LV vol d, MOD A4C: 82.0 ml  LV vol s, MOD A2C: 28.7 ml LV vol s, MOD A4C: 35.1 ml LV SV MOD A2C:     44.6 ml LV SV MOD A4C:     82.0 ml LV SV MOD BP:      46.3 ml RIGHT VENTRICLE             IVC RV Basal diam:  1.80 cm     IVC diam: 1.60 cm RV S prime:     13.50 cm/s TAPSE (M-mode): 2.3 cm LEFT ATRIUM             Index       RIGHT ATRIUM           Index LA diam:        4.00 cm 1.82 cm/m  RA Area:     12.10 cm LA Vol (A2C):   74.9 ml 34.05 ml/m RA Volume:   22.20 ml  10.09 ml/m LA Vol (A4C):   32.2 ml 14.64 ml/m LA Biplane Vol: 50.7 ml 23.05 ml/m  AORTIC VALVE LVOT Vmax:   94.20 cm/s LVOT Vmean:  64.400 cm/s LVOT VTI:    0.176 m  AORTA Ao Root diam: 3.80 cm Ao Asc diam:  3.80 cm MITRAL VALVE MV Area (PHT): 3.12 cm    SHUNTS MV Decel Time: 243 msec    Systemic VTI:  0.18 m MV E velocity: 55.30 cm/s  Systemic Diam: 2.40 cm MV A velocity: 97.30 cm/s MV E/A ratio:  0.57 Mihai Croitoru MD Electronically signed by Sanda Klein MD Signature Date/Time: 01/24/2021/4:17:34 PM    Final    CT HEAD CODE STROKE WO CONTRAST  Result Date: 01/23/2021 CLINICAL DATA:  Code stroke.  Left-sided facial droop EXAM: CT HEAD WITHOUT CONTRAST TECHNIQUE: Contiguous axial images were obtained from the base of the skull through the vertex without  intravenous contrast. COMPARISON:  2019 FINDINGS: Brain: There is acute hemorrhage centered within the left thalamus measuring 2.6 x 2.8 x 3 cm. Surrounding edema is present. There is partial effacement of the third ventricle. Intraventricular extension is present with hemorrhage seen within the left lateral and third ventricles. Gray-white differentiation is preserved. Patchy and confluent areas of hypoattenuation in the supratentorial white matter are nonspecific but probably reflect moderate chronic microvascular ischemic changes. Small chronic right cerebellar infarct. Prominence of the ventricles and sulci reflects generalized parenchymal volume loss. Vascular: There is intracranial atherosclerotic calcification at  the skull base. Skull: Unremarkable. Sinuses/Orbits: Paranasal sinus mucosal thickening. No acute orbital abnormality. Other: Mastoid air cells are clear. IMPRESSION: Acute left thalamic hemorrhage with mild regional mass effect and intraventricular extension. No hydrocephalus. Chronic microvascular ischemic changes. Small chronic right cerebellar infarct. Initial results were communicated to Dr. Lorrin Goodell at 3:42 pm on 01/23/2021 by text page via the [facility] messaging system. Electronically Signed   By: Macy Mis M.D.   On: 01/23/2021 15:45    ECG & Cardiac Imaging    *** - personally reviewed.  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   Signed, Marykay Lex, MD 01/29/2021, 10:30 PM  For questions or updates, please contact   Please consult [email or web address] for contact info under Cardiology/STEMI.

## 2021-01-29 NOTE — Progress Notes (Addendum)
STROKE TEAM PROGRESS NOTE   SUBJECTIVE (INTERVAL HISTORY) No family is at the bedside. Pt lying in bed, nonverbal, however, able to gaze to the right but still has right hemianopia. 3% tapered off this am. Still on amiodarone IV. Had one brief SVT episode last night.    OBJECTIVE Temp:  [98 F (36.7 C)-99.3 F (37.4 C)] 98.6 F (37 C) (05/15 0800) Pulse Rate:  [64-141] 83 (05/15 0820) Cardiac Rhythm: Normal sinus rhythm (05/15 0800) Resp:  [13-27] 14 (05/15 0820) BP: (98-178)/(50-84) 160/67 (05/15 0820) SpO2:  [90 %-98 %] 95 % (05/15 0820) Weight:  [104.3 kg] 104.3 kg (05/15 0400)  Recent Labs  Lab 01/28/21 1533 01/28/21 1935 01/28/21 2326 01/29/21 0324 01/29/21 0803  GLUCAP 184* 230* 222* 213* 189*   Recent Labs  Lab 01/25/21 1645 01/26/21 0744 01/26/21 0907 01/26/21 1708 01/27/21 0445 01/27/21 1425 01/27/21 1737 01/27/21 2016 01/28/21 0439 01/28/21 1309 01/28/21 2023 01/29/21 0054 01/29/21 0640  NA  --   --  134*  --  138   < > 142   < > 147* 148* 150* 150* 152*  K  --   --  4.0  --  3.8  --  3.9  --  4.8  --   --   --  3.7  CL  --   --  102  --  105  --  110  --  116*  --   --   --  123*  CO2  --   --  23  --  24  --  21*  --  19*  --   --   --  22  GLUCOSE  --   --  332*  --  223*  --  216*  --  163*  --   --   --  192*  BUN  --   --  30*  --  29*  --  34*  --  30*  --   --   --  25*  CREATININE  --   --  1.39*  --  1.17  --  1.11  --  0.98  --   --   --  0.90  CALCIUM  --   --  9.0  --  9.2  --  9.1  --  9.1  --   --   --  9.4  MG 1.7 1.8  --  1.8  --   --  2.0  --   --   --   --   --   --   PHOS 3.2 2.3*  --  3.2  --   --   --   --   --   --   --   --   --    < > = values in this interval not displayed.   Recent Labs  Lab 01/23/21 1537 01/27/21 1737  AST 23 34  ALT 21 21  ALKPHOS 55 52  BILITOT 0.8 1.1  PROT 6.7 6.7  ALBUMIN 4.0 3.3*   Recent Labs  Lab 01/23/21 1537 01/23/21 1543 01/26/21 0907 01/27/21 0445 01/27/21 1737 01/28/21 0439  01/29/21 0640  WBC 4.5  --  13.0* 10.6* 8.8 7.8 6.8  NEUTROABS 2.7  --   --  7.9*  --   --   --   HGB 11.6*   < > 11.8* 11.4* 11.7* 10.4* 10.0*  HCT 38.4*   < > 38.5* 37.1* 38.3* 34.8* 33.8*  MCV 88.9  --  84.6 85.1 85.3 87.4 87.3  PLT 185  --  178 165 169 159 198   < > = values in this interval not displayed.   No results for input(s): CKTOTAL, CKMB, CKMBINDEX, TROPONINI in the last 168 hours. No results for input(s): LABPROT, INR in the last 72 hours. Recent Labs    01/26/21 1054  COLORURINE AMBER*  LABSPEC 1.026  PHURINE 5.0  GLUCOSEU 150*  HGBUR NEGATIVE  BILIRUBINUR NEGATIVE  KETONESUR NEGATIVE  PROTEINUR 30*  NITRITE NEGATIVE  LEUKOCYTESUR NEGATIVE       Component Value Date/Time   CHOL 113 01/28/2021 0439   CHOL 141 10/09/2018 1328   TRIG 41 01/28/2021 0439   HDL 41 01/28/2021 0439   HDL 57 10/09/2018 1328   CHOLHDL 2.8 01/28/2021 0439   VLDL 8 01/28/2021 0439   LDLCALC 64 01/28/2021 0439   LDLCALC 69 10/09/2018 1328   Lab Results  Component Value Date   HGBA1C 5.8 (H) 01/23/2021      Component Value Date/Time   LABOPIA NONE DETECTED 04/01/2018 1148   COCAINSCRNUR NONE DETECTED 04/01/2018 1148   LABBENZ NONE DETECTED 04/01/2018 1148   AMPHETMU NONE DETECTED 04/01/2018 1148   THCU NONE DETECTED 04/01/2018 1148   LABBARB (A) 04/01/2018 1148    Result not available. Reagent lot number recalled by manufacturer.    No results for input(s): ETH in the last 168 hours.  I have personally reviewed the radiological images below and agree with the radiology interpretations.  CT Head Wo Contrast  Result Date: 01/27/2021 CLINICAL DATA:  Follow-up hemorrhagic stroke involving the LEFT thalamus with extension into the ventricular system. EXAM: CT HEAD WITHOUT CONTRAST TECHNIQUE: Contiguous axial images were obtained from the base of the skull through the vertex without intravenous contrast. COMPARISON:  01/25/2021 and earlier. FINDINGS: Brain: Hematoma involving the  LEFT thalamus measuring approximately 2.6 x 2.8 x 2.8 cm, unchanged in size since the initial 01/23/2021 CT. Interval decrease in the amount of intraventricular hemorrhage in the lateral ventricles and the third ventricle. Mass-effect with midline shift of approximately 7 mm to the RIGHT, unchanged. No new hemorrhage or hematoma. No extra-axial fluid collections. Vascular: Mild BILATERAL carotid siphon and vertebral artery atherosclerosis. Hyperdense vessel. Skull: No skull fracture or other focal osseous abnormality involving the skull. Sinuses/Orbits: Paranasal sinuses, mastoid air cells and middle ear cavities well-aerated. Benign senile scleral calcifications in both eyes. Other: None. IMPRESSION: 1. Stable LEFT thalamic hematoma and associated midline shift to the RIGHT of approximately 7 mm. 2. Decreasing intraventricular hemorrhage. 3. No new intracranial abnormality. Electronically Signed   By: Evangeline Dakin M.D.   On: 01/27/2021 10:55   CT HEAD WO CONTRAST  Result Date: 01/25/2021 CLINICAL DATA:  Follow-up examination for acute stroke. EXAM: CT HEAD WITHOUT CONTRAST TECHNIQUE: Contiguous axial images were obtained from the base of the skull through the vertex without intravenous contrast. COMPARISON:  Prior CT from 01/23/2021. FINDINGS: Brain: Intraparenchymal hemorrhage centered at the left thalamus is not significantly changed in size or morphology as compared to previous exam. Surrounding edema is stable to mildly increased. Similar regional mass effect with localized left-to-right shift. Intraventricular extension with blood within the lateral and third ventricles, relatively stable. Stable ventricular size and morphology without hydrocephalus or trapping. Basilar cisterns remain patent. No new intracranial hemorrhage or large vessel territory infarct. No extra-axial collection. Underlying atrophy with chronic microvascular ischemic disease again noted. Small remote right cerebellar infarcts  noted as well. Vascular: No hyperdense vessel. Scattered vascular calcifications noted  within the carotid siphons. Skull: Scalp soft tissues and calvarium demonstrate no acute finding. Sinuses/Orbits: Globes and orbital soft tissues demonstrate no acute finding. Mild scattered mucosal thickening noted within the ethmoidal air cells and maxillary sinuses. Paranasal sinuses are otherwise clear. Mastoid air cells remain largely clear. Other: None. IMPRESSION: 1. No significant interval change in size and morphology of left thalamic hemorrhage. Surrounding edema has mildly increased from prior, although regional mass effect and localized left-to-right shift is not significantly changed. 2. Intraventricular extension with blood within the lateral and third ventricles, stable. Stable ventricular size and morphology without hydrocephalus or trapping. 3. No other new acute intracranial abnormality. Electronically Signed   By: Jeannine Boga M.D.   On: 01/25/2021 04:19   CT HEAD WO CONTRAST  Result Date: 01/23/2021 CLINICAL DATA:  Follow-up intracranial hemorrhage EXAM: CT HEAD WITHOUT CONTRAST TECHNIQUE: Contiguous axial images were obtained from the base of the skull through the vertex without intravenous contrast. COMPARISON:  CT from earlier in the same day. FINDINGS: Brain: Parenchymal hemorrhage is again noted in the region of the left thalamus. Some surrounding edema is noted. The hematoma measures approximately 3.0 x 2.4 cm and roughly stable from the prior exam. Some intraventricular component is again noted particularly in the left lateral ventricle. Chronic white matter ischemic changes are noted. No new focal hemorrhage is seen. Chronic right cerebellar infarct is noted. Vascular: No hyperdense vessel or unexpected calcification. Skull: Normal. Negative for fracture or focal lesion. Sinuses/Orbits: No acute finding. Other: None. IMPRESSION: Stable appearing left thalamic hemorrhage with intraventricular  component. No new focal abnormality is noted. Electronically Signed   By: Inez Catalina M.D.   On: 01/23/2021 21:54   MR MRA HEAD WO CONTRAST  Result Date: 01/23/2021 CLINICAL DATA:  Initial evaluation for acute intracranial hemorrhage, headache. EXAM: MRI HEAD WITHOUT CONTRAST MRA HEAD WITHOUT CONTRAST TECHNIQUE: Multiplanar, multi-echo pulse sequences of the brain and surrounding structures were acquired without intravenous contrast. Angiographic images of the Circle of Willis were acquired using MRA technique without intravenous contrast. COMPARISON: No pertinent prior exam. COMPARISON:  Prior CT from earlier the same day as well as previous MRI from 06/23/2018. FINDINGS: MRI HEAD FINDINGS Brain: Examination moderately to severely degraded by motion artifact. Generalized age-related cerebral atrophy. Patchy and confluent T2/FLAIR hyperintensity within the periventricular and deep white matter both cerebral hemispheres most consistent with chronic small vessel ischemic disease. Few small remote right cerebellar infarcts noted. Previously identified acute intraparenchymal hemorrhage centered at the left thalamus again seen. Bleed measures 3.0 x 3.1 x 3.4 cm on this motion degraded exam (estimated volume 16 cc). No visible underlying mass lesion or other structure abnormality on this motion degraded noncontrast examination. Surrounding vasogenic edema with mild regional mass effect. Associated mild localized left-to-right shift measures up to 5 mm at the septum pellucidum. Associated intraventricular extension with blood within the left greater than right lateral ventricles. No hydrocephalus or ventricular trapping at this time. No other evidence for acute intracranial hemorrhage. No other foci of susceptibility artifact to suggest chronic hemorrhage elsewhere within the brain. No other foci of restricted diffusion to suggest acute or subacute ischemia. Gray-white matter differentiation otherwise maintained. No  mass lesion or mass effect elsewhere within the brain. No extra-axial fluid collection. Pituitary gland suprasellar region within normal limits. Vascular: Major intracranial vascular flow voids are maintained. Skull and upper cervical spine: Craniocervical junction within normal limits. Bone marrow signal intensity normal. No scalp soft tissue abnormality. Sinuses/Orbits: Patient status post ocular lens replacement  on the right. Globes and orbital soft tissues demonstrate no acute finding. Paranasal sinuses are largely clear. Trace bilateral mastoid effusions, of doubtful significance. Inner ear structures grossly normal. Other: None. MRA HEAD FINDINGS ANTERIOR CIRCULATION: Examination degraded by motion artifact. Both internal carotid arteries patent to the termini without stenosis or other appreciable abnormality. A1 segments patent. Anterior communicating artery complex grossly within normal limits. Anterior cerebral arteries grossly patent to their distal aspects without appreciable stenosis. No obvious M1 stenosis. Grossly normal MCA bifurcations. Distal MCA branches well perfused and symmetric. POSTERIOR CIRCULATION: Both V4 segments patent to the vertebrobasilar junction without stenosis. Left vertebral artery dominant. Left PICA origin patent and normal. Right PICA origin not definitely seen. Basilar patent to its distal aspect without stenosis. Superior cerebellar arteries patent bilaterally. Both PCAs primarily supplied via the basilar. PCAs perfused to their distal aspects without appreciable stenosis. No intracranial aneurysm. No vascular abnormality seen underlying the acute left thalamic hemorrhage. IMPRESSION: MRI HEAD IMPRESSION: 1. Motion degraded exam. 2. 3.0 x 3.1 x 3.4 cm acute intraparenchymal hemorrhage centered at the left thalamus. Surrounding vasogenic edema with mild regional mass effect and 5 mm of left-to-right shift. No visible underlying mass lesion or other structure abnormality on  this motion degraded exam. 3. Associated intraventricular extension with blood within the left greater than right lateral ventricles. No hydrocephalus or ventricular trapping at this time. 4. Underlying age-related cerebral atrophy with mild chronic small vessel ischemic disease, with a few small remote right cerebellar infarcts. MRA HEAD IMPRESSION: 1. Technically limited exam due to extensive motion artifact. 2. Grossly negative intracranial MRA. No large vessel occlusion, hemodynamically significant stenosis, or other acute vascular abnormality. No vascular abnormality seen underlying the acute left thalamic hemorrhage. Please note that while a MRA neck was also ordered for this exam, this was unable to be performed due to the patient's inability to tolerate the full length of the study and extensive motion artifact. Electronically Signed   By: Jeannine Boga M.D.   On: 01/23/2021 21:41   MR ANGIO NECK W WO CONTRAST  Result Date: 01/23/2021 CLINICAL DATA:  Initial evaluation for acute intracranial hemorrhage, headache. EXAM: MRI HEAD WITHOUT CONTRAST MRA HEAD WITHOUT CONTRAST TECHNIQUE: Multiplanar, multi-echo pulse sequences of the brain and surrounding structures were acquired without intravenous contrast. Angiographic images of the Circle of Willis were acquired using MRA technique without intravenous contrast. COMPARISON: No pertinent prior exam. COMPARISON:  Prior CT from earlier the same day as well as previous MRI from 06/23/2018. FINDINGS: MRI HEAD FINDINGS Brain: Examination moderately to severely degraded by motion artifact. Generalized age-related cerebral atrophy. Patchy and confluent T2/FLAIR hyperintensity within the periventricular and deep white matter both cerebral hemispheres most consistent with chronic small vessel ischemic disease. Few small remote right cerebellar infarcts noted. Previously identified acute intraparenchymal hemorrhage centered at the left thalamus again seen. Bleed  measures 3.0 x 3.1 x 3.4 cm on this motion degraded exam (estimated volume 16 cc). No visible underlying mass lesion or other structure abnormality on this motion degraded noncontrast examination. Surrounding vasogenic edema with mild regional mass effect. Associated mild localized left-to-right shift measures up to 5 mm at the septum pellucidum. Associated intraventricular extension with blood within the left greater than right lateral ventricles. No hydrocephalus or ventricular trapping at this time. No other evidence for acute intracranial hemorrhage. No other foci of susceptibility artifact to suggest chronic hemorrhage elsewhere within the brain. No other foci of restricted diffusion to suggest acute or subacute ischemia. Gray-white matter differentiation  otherwise maintained. No mass lesion or mass effect elsewhere within the brain. No extra-axial fluid collection. Pituitary gland suprasellar region within normal limits. Vascular: Major intracranial vascular flow voids are maintained. Skull and upper cervical spine: Craniocervical junction within normal limits. Bone marrow signal intensity normal. No scalp soft tissue abnormality. Sinuses/Orbits: Patient status post ocular lens replacement on the right. Globes and orbital soft tissues demonstrate no acute finding. Paranasal sinuses are largely clear. Trace bilateral mastoid effusions, of doubtful significance. Inner ear structures grossly normal. Other: None. MRA HEAD FINDINGS ANTERIOR CIRCULATION: Examination degraded by motion artifact. Both internal carotid arteries patent to the termini without stenosis or other appreciable abnormality. A1 segments patent. Anterior communicating artery complex grossly within normal limits. Anterior cerebral arteries grossly patent to their distal aspects without appreciable stenosis. No obvious M1 stenosis. Grossly normal MCA bifurcations. Distal MCA branches well perfused and symmetric. POSTERIOR CIRCULATION: Both V4  segments patent to the vertebrobasilar junction without stenosis. Left vertebral artery dominant. Left PICA origin patent and normal. Right PICA origin not definitely seen. Basilar patent to its distal aspect without stenosis. Superior cerebellar arteries patent bilaterally. Both PCAs primarily supplied via the basilar. PCAs perfused to their distal aspects without appreciable stenosis. No intracranial aneurysm. No vascular abnormality seen underlying the acute left thalamic hemorrhage. IMPRESSION: MRI HEAD IMPRESSION: 1. Motion degraded exam. 2. 3.0 x 3.1 x 3.4 cm acute intraparenchymal hemorrhage centered at the left thalamus. Surrounding vasogenic edema with mild regional mass effect and 5 mm of left-to-right shift. No visible underlying mass lesion or other structure abnormality on this motion degraded exam. 3. Associated intraventricular extension with blood within the left greater than right lateral ventricles. No hydrocephalus or ventricular trapping at this time. 4. Underlying age-related cerebral atrophy with mild chronic small vessel ischemic disease, with a few small remote right cerebellar infarcts. MRA HEAD IMPRESSION: 1. Technically limited exam due to extensive motion artifact. 2. Grossly negative intracranial MRA. No large vessel occlusion, hemodynamically significant stenosis, or other acute vascular abnormality. No vascular abnormality seen underlying the acute left thalamic hemorrhage. Please note that while a MRA neck was also ordered for this exam, this was unable to be performed due to the patient's inability to tolerate the full length of the study and extensive motion artifact. Electronically Signed   By: Jeannine Boga M.D.   On: 01/23/2021 21:41   MR BRAIN WO CONTRAST  Result Date: 01/23/2021 CLINICAL DATA:  Initial evaluation for acute intracranial hemorrhage, headache. EXAM: MRI HEAD WITHOUT CONTRAST MRA HEAD WITHOUT CONTRAST TECHNIQUE: Multiplanar, multi-echo pulse sequences of  the brain and surrounding structures were acquired without intravenous contrast. Angiographic images of the Circle of Willis were acquired using MRA technique without intravenous contrast. COMPARISON: No pertinent prior exam. COMPARISON:  Prior CT from earlier the same day as well as previous MRI from 06/23/2018. FINDINGS: MRI HEAD FINDINGS Brain: Examination moderately to severely degraded by motion artifact. Generalized age-related cerebral atrophy. Patchy and confluent T2/FLAIR hyperintensity within the periventricular and deep white matter both cerebral hemispheres most consistent with chronic small vessel ischemic disease. Few small remote right cerebellar infarcts noted. Previously identified acute intraparenchymal hemorrhage centered at the left thalamus again seen. Bleed measures 3.0 x 3.1 x 3.4 cm on this motion degraded exam (estimated volume 16 cc). No visible underlying mass lesion or other structure abnormality on this motion degraded noncontrast examination. Surrounding vasogenic edema with mild regional mass effect. Associated mild localized left-to-right shift measures up to 5 mm at the septum pellucidum. Associated  intraventricular extension with blood within the left greater than right lateral ventricles. No hydrocephalus or ventricular trapping at this time. No other evidence for acute intracranial hemorrhage. No other foci of susceptibility artifact to suggest chronic hemorrhage elsewhere within the brain. No other foci of restricted diffusion to suggest acute or subacute ischemia. Gray-white matter differentiation otherwise maintained. No mass lesion or mass effect elsewhere within the brain. No extra-axial fluid collection. Pituitary gland suprasellar region within normal limits. Vascular: Major intracranial vascular flow voids are maintained. Skull and upper cervical spine: Craniocervical junction within normal limits. Bone marrow signal intensity normal. No scalp soft tissue abnormality.  Sinuses/Orbits: Patient status post ocular lens replacement on the right. Globes and orbital soft tissues demonstrate no acute finding. Paranasal sinuses are largely clear. Trace bilateral mastoid effusions, of doubtful significance. Inner ear structures grossly normal. Other: None. MRA HEAD FINDINGS ANTERIOR CIRCULATION: Examination degraded by motion artifact. Both internal carotid arteries patent to the termini without stenosis or other appreciable abnormality. A1 segments patent. Anterior communicating artery complex grossly within normal limits. Anterior cerebral arteries grossly patent to their distal aspects without appreciable stenosis. No obvious M1 stenosis. Grossly normal MCA bifurcations. Distal MCA branches well perfused and symmetric. POSTERIOR CIRCULATION: Both V4 segments patent to the vertebrobasilar junction without stenosis. Left vertebral artery dominant. Left PICA origin patent and normal. Right PICA origin not definitely seen. Basilar patent to its distal aspect without stenosis. Superior cerebellar arteries patent bilaterally. Both PCAs primarily supplied via the basilar. PCAs perfused to their distal aspects without appreciable stenosis. No intracranial aneurysm. No vascular abnormality seen underlying the acute left thalamic hemorrhage. IMPRESSION: MRI HEAD IMPRESSION: 1. Motion degraded exam. 2. 3.0 x 3.1 x 3.4 cm acute intraparenchymal hemorrhage centered at the left thalamus. Surrounding vasogenic edema with mild regional mass effect and 5 mm of left-to-right shift. No visible underlying mass lesion or other structure abnormality on this motion degraded exam. 3. Associated intraventricular extension with blood within the left greater than right lateral ventricles. No hydrocephalus or ventricular trapping at this time. 4. Underlying age-related cerebral atrophy with mild chronic small vessel ischemic disease, with a few small remote right cerebellar infarcts. MRA HEAD IMPRESSION: 1.  Technically limited exam due to extensive motion artifact. 2. Grossly negative intracranial MRA. No large vessel occlusion, hemodynamically significant stenosis, or other acute vascular abnormality. No vascular abnormality seen underlying the acute left thalamic hemorrhage. Please note that while a MRA neck was also ordered for this exam, this was unable to be performed due to the patient's inability to tolerate the full length of the study and extensive motion artifact. Electronically Signed   By: Jeannine Boga M.D.   On: 01/23/2021 21:41   DG Chest Port 1 View  Result Date: 01/27/2021 CLINICAL DATA:  Code stroke EXAM: PORTABLE CHEST 1 VIEW COMPARISON:  01/26/2021 FINDINGS: Esophageal tube tip coiled within the proximal stomach. No focal opacity or pleural effusion. Stable cardiomediastinal silhouette with borderline cardiomegaly. No pneumothorax. IMPRESSION: No active disease. Electronically Signed   By: Donavan Foil M.D.   On: 01/27/2021 18:38   DG Chest Port 1 View  Result Date: 01/26/2021 CLINICAL DATA:  Fever, code stroke EXAM: PORTABLE CHEST 1 VIEW COMPARISON:  Radiograph 08/13/2011 FINDINGS: Unchanged cardiomediastinal silhouette. There is no focal airspace disease. There is no pleural effusion or visible pneumothorax. There is no acute osseous abnormality. There is a at weighted tip enteric tube overlying the stomach. IMPRESSION: No evidence of acute cardiopulmonary disease. Electronically Signed   By: Edison Nasuti  Chancy Milroy   On: 01/26/2021 10:06   EEG adult  Result Date: 01/27/2021 Lora Havens, MD     01/27/2021 11:40 AM Patient Name: JAYDUN WORTMAN MRN: [id number] Epilepsy Attending: Lora Havens Referring Physician/Provider: Dr. Antony Contras Date: 01/27/2021 Duration: 23.10 minutes Patient history: 85 year old male with left thalamic hemorrhage.  EEG to evaluate for seizures. Level of alertness: Awake AEDs during EEG study: Gabapentin Technical aspects: This EEG study was done with scalp  electrodes positioned according to the 10-20 International system of electrode placement. Electrical activity was acquired at a sampling rate of 500Hz  and reviewed with a high frequency filter of 70Hz  and a low frequency filter of 1Hz . EEG data were recorded continuously and digitally stored. Description: The posterior dominant rhythm consists of 8 Hz activity of moderate voltage (25-35 uV) seen predominantly in posterior head regions, symmetric and reactive to eye opening and eye closing.  EEG showed continuous rhythmic sharply contoured 3 to 5 Hz theta- delta slowing in left frontotemporal region hyperventilation and photic stimulation were not performed.   ABNORMALITY -Continuous slow, left frontotemporal region IMPRESSION: This study is suggestive of cortical dysfunction in left frontotemporal region likely secondary to underlying bleed. No seizures or definite epileptiform discharges were seen throughout the recording. Lora Havens   ECHOCARDIOGRAM COMPLETE  Result Date: 01/24/2021    ECHOCARDIOGRAM REPORT   Patient Name:   SHEDDRICK ARRONA Date of Exam: 01/24/2021 Medical Rec #:  [id number]     Height:       72.0 in Accession #:    [id number]    Weight:       215.6 lb Date of Birth:  1935/10/28     BSA:          2.200 m Patient Age:    59 years      BP:           143/92 mmHg Patient Gender: M             HR:           77 bpm. Exam Location:  Inpatient Procedure: 2D Echo, Cardiac Doppler and Color Doppler Indications:    Stroke I63.9  History:        Patient has prior history of Echocardiogram examinations, most                 recent 04/02/2018. Stroke, Signs/Symptoms:Chest Pain; Risk                 Factors:Hypertension, Diabetes and Dyslipidemia.  Sonographer:    Tiffany Dance Referring Phys: [id number] [address]  Sonographer Comments: Suboptimal subcostal window. IMPRESSIONS  1. Left ventricular ejection fraction, by estimation, is 55 to 60%. The left ventricle has normal function. The left ventricle  has no regional wall motion abnormalities. Left ventricular diastolic parameters are consistent with Grade I diastolic dysfunction (impaired relaxation).  2. Right ventricular systolic function is normal. The right ventricular size is normal.  3. Left atrial size was mildly dilated.  4. The mitral valve is normal in structure. No evidence of mitral valve regurgitation. No evidence of mitral stenosis.  5. The aortic valve is normal in structure. Aortic valve regurgitation is not visualized. Mild aortic valve sclerosis is present, with no evidence of aortic valve stenosis.  6. The inferior vena cava is normal in size with greater than 50% respiratory variability, suggesting right atrial pressure of 3 mmHg. Comparison(s): Prior images unable to be directly viewed, comparison made by report only. Conclusion(s)/Recommendation(s): No  intracardiac source of embolism detected on this transthoracic study. Consider saline contrast study or TEE to evaluate the atrial septal aneurysm for evidence of PFO/shunting. FINDINGS  Left Ventricle: Left ventricular ejection fraction, by estimation, is 55 to 60%. The left ventricle has normal function. The left ventricle has no regional wall motion abnormalities. The left ventricular internal cavity size was normal in size. There is  borderline concentric left ventricular hypertrophy. Left ventricular diastolic parameters are consistent with Grade I diastolic dysfunction (impaired relaxation). Indeterminate filling pressures. Right Ventricle: The right ventricular size is normal. No increase in right ventricular wall thickness. Right ventricular systolic function is normal. Left Atrium: Left atrial size was mildly dilated. Right Atrium: Right atrial size was normal in size. Pericardium: There is no evidence of pericardial effusion. Mitral Valve: The mitral valve is normal in structure. Mild mitral annular calcification. No evidence of mitral valve regurgitation. No evidence of mitral  valve stenosis. Tricuspid Valve: The tricuspid valve is normal in structure. Tricuspid valve regurgitation is not demonstrated. No evidence of tricuspid stenosis. Aortic Valve: The aortic valve is normal in structure. Aortic valve regurgitation is not visualized. Mild aortic valve sclerosis is present, with no evidence of aortic valve stenosis. Pulmonic Valve: The pulmonic valve was normal in structure. Pulmonic valve regurgitation is not visualized. No evidence of pulmonic stenosis. Aorta: The aortic root is normal in size and structure. Venous: The inferior vena cava is normal in size with greater than 50% respiratory variability, suggesting right atrial pressure of 3 mmHg. IAS/Shunts: The interatrial septum is aneurysmal. No atrial level shunt detected by color flow Doppler.  LEFT VENTRICLE PLAX 2D LVIDd:         4.00 cm     Diastology LVIDs:         3.00 cm     LV e' medial:    3.70 cm/s LV PW:         1.30 cm     LV E/e' medial:  14.9 LV IVS:        1.20 cm     LV e' lateral:   6.64 cm/s LVOT diam:     2.40 cm     LV E/e' lateral: 8.3 LV SV:         80 LV SV Index:   36 LVOT Area:     4.52 cm  LV Volumes (MOD) LV vol d, MOD A2C: 73.3 ml LV vol d, MOD A4C: 82.0 ml LV vol s, MOD A2C: 28.7 ml LV vol s, MOD A4C: 35.1 ml LV SV MOD A2C:     44.6 ml LV SV MOD A4C:     82.0 ml LV SV MOD BP:      46.3 ml RIGHT VENTRICLE             IVC RV Basal diam:  1.80 cm     IVC diam: 1.60 cm RV S prime:     13.50 cm/s TAPSE (M-mode): 2.3 cm LEFT ATRIUM             Index       RIGHT ATRIUM           Index LA diam:        4.00 cm 1.82 cm/m  RA Area:     12.10 cm LA Vol (A2C):   74.9 ml 34.05 ml/m RA Volume:   22.20 ml  10.09 ml/m LA Vol (A4C):   32.2 ml 14.64 ml/m LA Biplane Vol: 50.7 ml 23.05 ml/m  AORTIC  VALVE LVOT Vmax:   94.20 cm/s LVOT Vmean:  64.400 cm/s LVOT VTI:    0.176 m  AORTA Ao Root diam: 3.80 cm Ao Asc diam:  3.80 cm MITRAL VALVE MV Area (PHT): 3.12 cm    SHUNTS MV Decel Time: 243 msec    Systemic VTI:  0.18 m  MV E velocity: 55.30 cm/s  Systemic Diam: 2.40 cm MV A velocity: 97.30 cm/s MV E/A ratio:  0.57 Mihai Croitoru MD Electronically signed by Sanda Klein MD Signature Date/Time: 01/24/2021/4:17:34 PM    Final    CT HEAD CODE STROKE WO CONTRAST  Result Date: 01/23/2021 CLINICAL DATA:  Code stroke.  Left-sided facial droop EXAM: CT HEAD WITHOUT CONTRAST TECHNIQUE: Contiguous axial images were obtained from the base of the skull through the vertex without intravenous contrast. COMPARISON:  2019 FINDINGS: Brain: There is acute hemorrhage centered within the left thalamus measuring 2.6 x 2.8 x 3 cm. Surrounding edema is present. There is partial effacement of the third ventricle. Intraventricular extension is present with hemorrhage seen within the left lateral and third ventricles. Gray-white differentiation is preserved. Patchy and confluent areas of hypoattenuation in the supratentorial white matter are nonspecific but probably reflect moderate chronic microvascular ischemic changes. Small chronic right cerebellar infarct. Prominence of the ventricles and sulci reflects generalized parenchymal volume loss. Vascular: There is intracranial atherosclerotic calcification at the skull base. Skull: Unremarkable. Sinuses/Orbits: Paranasal sinus mucosal thickening. No acute orbital abnormality. Other: Mastoid air cells are clear. IMPRESSION: Acute left thalamic hemorrhage with mild regional mass effect and intraventricular extension. No hydrocephalus. Chronic microvascular ischemic changes. Small chronic right cerebellar infarct. Initial results were communicated to Dr. Lorrin Goodell at 3:42 pm on 01/23/2021 by text page via the [facility] messaging system. Electronically Signed   By: Macy Mis M.D.   On: 01/23/2021 15:45    PHYSICAL EXAM  Temp:  [98 F (36.7 C)-99.3 F (37.4 C)] 98.6 F (37 C) (05/15 0800) Pulse Rate:  [64-141] 83 (05/15 0820) Resp:  [13-27] 14 (05/15 0820) BP: (98-178)/(50-84) 160/67 (05/15  0820) SpO2:  [90 %-98 %] 95 % (05/15 0820) Weight:  [104.3 kg] 104.3 kg (05/15 0400)  General - Well nourished, well developed, in no apparent distress.  Ophthalmologic - fundi not visualized due to noncooperation.  Cardiovascular - Regular rhythm and rate, not in afib now.  Neuro - awake, alert, eyes open, global aphasia except able to close eyes and lift left leg on command but not other commands. Not able to name and repeat. Left gaze preference but able to cross midline with right gaze, tracking bilaterally, still not blinking to visual threat on the right, PERRL. Right facial droop. Tongue protrusion not cooperative. LUE able to hold against gravity. LLE able to briefly lift up against gravity with spontaneous movement. RUE and RLE 0/5, RUE increased muscle tone. Sensation, coordination and gait not tested.    ASSESSMENT/PLAN Bensen K Flowersis a 85 y.o.malewith history of A. fib on Eliquis, hypertension, hyperlipidemia, diabetes admitted for L Thalamic ICH withIVH and an ICH score of 2. He is s/p Andexanet alpha for presumed ICH on Eliquis.  ICH -left thalamic ICH and IVH with associated cerebral edema in the setting hypertensive emergency and apixaban therapy for PAF  01/23/2021 head CT acute left thalamic ICH and intraventricular extension, no hydrocephalus  MRI head: 3.0 x 3.1 x 3.4 cm acute intraparenchymal hemorrhage centered at the left thalamus. Mild regional mass effect and 5 mm of left-to-right shift.  MRA head: Grossly negative, No LVO  CT repeat pending  CUS pending  2D Echo: EF 55-60%,   Repeat CT Head 5/11 and 5/13 - stable IPH, edema and mass effect 15mm MLS.   VTE prophylaxis -Lovenox  On Eliquis for known PAF prior to admission, no anticoagulant or antiplatelets for now.   Therapy recommendations:  SNF  Disposition:  TBD  Cerebral edema  CT repeat showed stable ICH and IVH with 49mm MLS  on 3% saline -> tapered off  Na [phone]  CT repeat  pending  PAF SVT x 2   Home medication: Eliquis, reversed with Andexxa  Followed by Dr. Harrington Challenger  2019 Echo showed EF 45-50%  now with improved EF 55-60%  On amiodarone IV  On coreg 12.5 bid  No antithrombotic now  Hypertension  Home meds: Norvasc 10, Coreg 12.5, Cozaar 100mg    Off Cleviprex  On Coreg 12.5 and amlodipine 10 and losartan 50 bid  BP goal < 160  Long term goal normotensive  Hyperlipidemia  Home meds:  Lipitor 80mg   LDL 55, goal < 70  Hold statin in setting of   current ICH    Consider to resume statin at discharge  DM2, controlled  Home meds are glargine 14 u hs, metformin 1000 BID, Januvia 100 daily  HgbA1c 5.8, goal < 7.0  CBGs  SSI  Dysphagia Feeding  Failed swallow eval 5/11, NPO now  Cortrack tube placed  Jevity @ 60cc/hr  Speech to follow  Need PEG if going to SNF  AKI, resolved  Cre 1.22->1.39->1.11->0.98->0.90  Setting of severe hypertension  Avoid nephrotoxins  On tube feeding and IV fluid  Fever Leukocytosis, improved  Tmax 101.7-> afebrile  Monitor fever curve  CXR: no acute findings  CBC with leukocytosis wbc 13-> 8.8->6.8  UA: rare bacteria,  Urine Culture negative  Chest xray unremarkable.   Other Stroke Risk Factors  Advanced Age >/= 11   Former Cigarette smoker quit 1995.   Current ETOH use, advised to drink no more than 2 drink(s) a day  Hx stroke/TIA  Other Active Problems  Constipation - on senokot, will give dulcolax suppository   Hospital day # 6  This patient is critically ill due to [place] with eliquis reversal, afib and SVT on amiodarone, fever, AKI, cerebral edema on 3% Saline and at significant risk of neurological worsening, death form hematoma expansion, brain herniation, sepsis, heart failure. This patient's care requires constant monitoring of vital signs, hemodynamics, respiratory and cardiac monitoring, review of multiple databases, neurological assessment, discussion  with family, other specialists and medical decision making of high complexity. I spent 35 minutes of neurocritical care time in the care of this patient. I discussed with Dr. Carlis Abbott CCM.   Rosalin Hawking, MD PhD Stroke Neurology 01/29/2021 8:59 AM    To contact Stroke Continuity provider, please refer to [email or web address] After hours, contact General Neurology

## 2021-01-30 ENCOUNTER — Inpatient Hospital Stay (HOSPITAL_COMMUNITY): Payer: Medicare HMO

## 2021-01-30 DIAGNOSIS — I471 Supraventricular tachycardia: Secondary | ICD-10-CM | POA: Diagnosis not present

## 2021-01-30 DIAGNOSIS — I619 Nontraumatic intracerebral hemorrhage, unspecified: Secondary | ICD-10-CM | POA: Diagnosis not present

## 2021-01-30 DIAGNOSIS — I169 Hypertensive crisis, unspecified: Secondary | ICD-10-CM | POA: Diagnosis not present

## 2021-01-30 DIAGNOSIS — I615 Nontraumatic intracerebral hemorrhage, intraventricular: Secondary | ICD-10-CM

## 2021-01-30 DIAGNOSIS — G936 Cerebral edema: Secondary | ICD-10-CM

## 2021-01-30 DIAGNOSIS — I610 Nontraumatic intracerebral hemorrhage in hemisphere, subcortical: Secondary | ICD-10-CM | POA: Diagnosis not present

## 2021-01-30 LAB — CBC
HCT: 35.5 % — ABNORMAL LOW (ref 39.0–52.0)
Hemoglobin: 10.6 g/dL — ABNORMAL LOW (ref 13.0–17.0)
MCH: 26.2 pg (ref 26.0–34.0)
MCHC: 29.9 g/dL — ABNORMAL LOW (ref 30.0–36.0)
MCV: 87.9 fL (ref 80.0–100.0)
Platelets: 216 10*3/uL (ref 150–400)
RBC: 4.04 MIL/uL — ABNORMAL LOW (ref 4.22–5.81)
RDW: 14.8 % (ref 11.5–15.5)
WBC: 8.7 10*3/uL (ref 4.0–10.5)
nRBC: 0.3 % — ABNORMAL HIGH (ref 0.0–0.2)

## 2021-01-30 LAB — BASIC METABOLIC PANEL
Anion gap: 9 (ref 5–15)
BUN: 27 mg/dL — ABNORMAL HIGH (ref 8–23)
CO2: 24 mmol/L (ref 22–32)
Calcium: 9.7 mg/dL (ref 8.9–10.3)
Chloride: 120 mmol/L — ABNORMAL HIGH (ref 98–111)
Creatinine, Ser: 0.95 mg/dL (ref 0.61–1.24)
GFR, Estimated: >60 mL/min (ref >60)
Glucose, Bld: 101 mg/dL — ABNORMAL HIGH (ref 70–99)
Potassium: 3.8 mmol/L (ref 3.5–5.1)
Sodium: 153 mmol/L — ABNORMAL HIGH (ref 135–145)

## 2021-01-30 LAB — GLUCOSE, CAPILLARY
Glucose-Capillary: 166 mg/dL — ABNORMAL HIGH (ref 70–99)
Glucose-Capillary: 99 mg/dL (ref 70–99)
Glucose-Capillary: 169 mg/dL — ABNORMAL HIGH (ref 70–99)
Glucose-Capillary: 166 mg/dL — ABNORMAL HIGH (ref 70–99)
Glucose-Capillary: 136 mg/dL — ABNORMAL HIGH (ref 70–99)
Glucose-Capillary: 130 mg/dL — ABNORMAL HIGH (ref 70–99)

## 2021-01-30 LAB — HEMOGLOBIN A1C
Hgb A1c MFr Bld: 6.1 % — ABNORMAL HIGH (ref 4.8–5.6)
Mean Plasma Glucose: 128 mg/dL

## 2021-01-30 LAB — SODIUM
Sodium: 151 mmol/L — ABNORMAL HIGH (ref 135–145)
Sodium: 151 mmol/L — ABNORMAL HIGH (ref 135–145)
Sodium: 149 mmol/L — ABNORMAL HIGH (ref 135–145)

## 2021-01-30 MED ORDER — LABETALOL HCL 5 MG/ML IV SOLN
5.0000 mg | INTRAVENOUS | Status: DC | PRN
Start: 2021-01-30 — End: 2021-02-04
  Administered 2021-01-30 – 2021-01-31 (×3): 20 mg via INTRAVENOUS
  Administered 2021-02-01: 5 mg via INTRAVENOUS
  Administered 2021-02-01: 10 mg via INTRAVENOUS
  Filled 2021-01-30 (×5): qty 4

## 2021-01-30 MED ORDER — CARVEDILOL 12.5 MG PO TABS
12.5000 mg | ORAL_TABLET | Freq: Once | ORAL | Status: DC
  Filled 2021-01-30: qty 1

## 2021-01-30 MED ORDER — CARVEDILOL 12.5 MG PO TABS
12.5000 mg | ORAL_TABLET | Freq: Once | ORAL | Status: AC
  Administered 2021-01-30: 12.5 mg via NASOGASTRIC

## 2021-01-30 MED ORDER — CARVEDILOL 12.5 MG PO TABS
25.0000 mg | ORAL_TABLET | Freq: Two times a day (BID) | ORAL | Status: DC
  Administered 2021-01-30 – 2021-02-28 (×57): 25 mg
  Filled 2021-01-30 (×59): qty 2

## 2021-01-30 NOTE — Progress Notes (Signed)
Telemetry reviewed. He has had two more episodes of sustained atrial tachycardia, each lasting about 8 minutes, neither associated with obvious symptoms or with hemodynamic instability, both resolved spontaneously. Carvedilol dose has been increased. Amiodarone has unpredictable absorption when not receiving enteral feeds, but once receiving consistent enteral feeds, switch to PO. Anticoagulants have been appropriately discontinued (Andexxa reversal). No new recommendations. Will continue to follow.

## 2021-01-30 NOTE — Progress Notes (Signed)
  Speech Language Pathology Treatment: Dysphagia  Patient Details Name: RACE LATOUR MRN: [id number] DOB: [birth year] Today's Date: 01/30/2021 Time: 2263-3354 SLP Time Calculation (min) (ACUTE ONLY): 13 min  Assessment / Plan / Recommendation Clinical Impression  Pt alert, did not follow commands despite max multimodal cues.  Allowed oral care and anticipated arrival of spoon at lips, opening mouth receiving ice/water, containing it orally and masticating ice. Swallow was palpable, likely delayed, and followed by consistent coughing, both immediate and delayed, concerning for poor airway protection.  Provided with max cues in effort to facilitate voice or automatic speech - pt with no volitional attempts to vocalize. Continue acute care SLP for aphasia and dysphagia; pt will need instrumental swallow study, hopefully can participate by the end of this week.  Continue NPO.   HPI HPI: Pt is a 85 y.o. M who presents with acute onset R sided weakness, facial droop, numbness, aphasia with left gaze preference. CTH showing L thalamic ICH. Significant PMH: afib, DM2, HLD, HTN, stroke      SLP Plan  Continue with current plan of care       Recommendations  Diet recommendations: NPO Medication Administration: Via alternative means                Oral Care Recommendations: Oral care QID Follow up Recommendations: Skilled Nursing facility SLP Visit Diagnosis: Dysphagia, unspecified (R13.10);Aphasia (R47.01) Plan: Continue with current plan of care       GO                Assunta Curtis 01/30/2021, 5:36 PM  Tyashia Morrisette L. Tivis Ringer, [place] Office number [phone]

## 2021-01-30 NOTE — Progress Notes (Signed)
STROKE TEAM PROGRESS NOTE   SUBJECTIVE (INTERVAL HISTORY) No family is at the bedside. Pt lying in bed, nonverbal. Overnight had again SVT, self converted back to NSR before adenosine given. Cardiology consulted, continued on coreg and IV amiodarone. Overnight also high BP, had several PRNs. Will increase coreg dose. CT repeat showed decreased III ventricle ventriculometry. Still has a lot secretions in the throat, CCM on board, on unasyn.    OBJECTIVE Temp:  [98.3 F (36.8 C)-100.1 F (37.8 C)] 98.7 F (37.1 C) (05/16 0800) Pulse Rate:  [69-149] 79 (05/16 0730) Cardiac Rhythm: Normal sinus rhythm (05/16 0400) Resp:  [11-23] 16 (05/16 0730) BP: (106-176)/(59-83) 173/75 (05/16 0730) SpO2:  [92 %-97 %] 95 % (05/16 0730) Weight:  [103.2 kg] 103.2 kg (05/16 0400)  Recent Labs  Lab 01/29/21 1546 01/29/21 1940 01/29/21 2339 01/30/21 0355 01/30/21 0801  GLUCAP 157* 191* 157* 166* 99   Recent Labs  Lab 01/25/21 1645 01/26/21 0744 01/26/21 0907 01/26/21 1708 01/27/21 0445 01/27/21 1425 01/27/21 1737 01/27/21 2016 01/28/21 0439 01/28/21 1309 01/29/21 0640 01/29/21 1325 01/29/21 1852 01/30/21 0111 01/30/21 0748  NA  --   --    < >  --  138   < > 142   < > 147*   < > 152* 153* 150* 151* 153*  K  --   --    < >  --  3.8  --  3.9  --  4.8  --  3.7  --   --   --  3.8  CL  --   --    < >  --  105  --  110  --  116*  --  123*  --   --   --  120*  CO2  --   --    < >  --  24  --  21*  --  19*  --  22  --   --   --  24  GLUCOSE  --   --    < >  --  223*  --  216*  --  163*  --  192*  --   --   --  101*  BUN  --   --    < >  --  29*  --  34*  --  30*  --  25*  --   --   --  27*  CREATININE  --   --    < >  --  1.17  --  1.11  --  0.98  --  0.90  --   --   --  0.95  CALCIUM  --   --    < >  --  9.2  --  9.1  --  9.1  --  9.4  --   --   --  9.7  MG 1.7 1.8  --  1.8  --   --  2.0  --   --   --   --   --   --   --   --   PHOS 3.2 2.3*  --  3.2  --   --   --   --   --   --   --   --   --    --   --    < > = values in this interval not displayed.   Recent Labs  Lab 01/23/21 1537 01/27/21 1737  AST 23 34  ALT 21  21  ALKPHOS 55 52  BILITOT 0.8 1.1  PROT 6.7 6.7  ALBUMIN 4.0 3.3*   Recent Labs  Lab 01/23/21 1537 01/23/21 1543 01/27/21 0445 01/27/21 1737 01/28/21 0439 01/29/21 0640 01/30/21 0748  WBC 4.5   < > 10.6* 8.8 7.8 6.8 8.7  NEUTROABS 2.7  --  7.9*  --   --   --   --   HGB 11.6*   < > 11.4* 11.7* 10.4* 10.0* 10.6*  HCT 38.4*   < > 37.1* 38.3* 34.8* 33.8* 35.5*  MCV 88.9   < > 85.1 85.3 87.4 87.3 87.9  PLT 185   < > 165 169 159 198 216   < > = values in this interval not displayed.   No results for input(s): CKTOTAL, CKMB, CKMBINDEX, TROPONINI in the last 168 hours. No results for input(s): LABPROT, INR in the last 72 hours. No results for input(s): COLORURINE, LABSPEC, [place], GLUCOSEU, HGBUR, BILIRUBINUR, KETONESUR, PROTEINUR, UROBILINOGEN, NITRITE, LEUKOCYTESUR in the last 72 hours.  Invalid input(s): APPERANCEUR     Component Value Date/Time   CHOL 113 01/28/2021 0439   CHOL 141 10/09/2018 1328   TRIG 41 01/28/2021 0439   HDL 41 01/28/2021 0439   HDL 57 10/09/2018 1328   CHOLHDL 2.8 01/28/2021 0439   VLDL 8 01/28/2021 0439   LDLCALC 64 01/28/2021 0439   LDLCALC 69 10/09/2018 1328   Lab Results  Component Value Date   HGBA1C 5.8 (H) 01/23/2021      Component Value Date/Time   LABOPIA NONE DETECTED 04/01/2018 1148   COCAINSCRNUR NONE DETECTED 04/01/2018 1148   LABBENZ NONE DETECTED 04/01/2018 1148   AMPHETMU NONE DETECTED 04/01/2018 1148   THCU NONE DETECTED 04/01/2018 1148   LABBARB (A) 04/01/2018 1148    Result not available. Reagent lot number recalled by manufacturer.    No results for input(s): ETH in the last 168 hours.  I have personally reviewed the radiological images below and agree with the radiology interpretations.  CT HEAD WO CONTRAST  Result Date: 01/30/2021 CLINICAL DATA:  Intracranial hemorrhage EXAM: CT HEAD  WITHOUT CONTRAST TECHNIQUE: Contiguous axial images were obtained from the base of the skull through the vertex without intravenous contrast. COMPARISON:  01/29/2021 FINDINGS: Brain: Left thalamic hemorrhage unchanged measuring 28 mm in diameter. Intraventricular hemorrhage unchanged. Negative for hydrocephalus Generalized atrophy with moderate white matter hypodensity diffusely unchanged. No acute ischemic infarct. No midline shift. Vascular: Negative for hyperdense vessel Skull: Negative Sinuses/Orbits: Mild mucosal edema paranasal sinuses. NG tube in place. Other: None IMPRESSION: Stable head CT. Left thalamic hemorrhage and intraventricular hemorrhage unchanged. Negative for hydrocephalus. Electronically Signed   By: Franchot Gallo M.D.   On: 01/30/2021 08:30   CT HEAD WO CONTRAST  Result Date: 01/29/2021 CLINICAL DATA:  Cerebral hemorrhage suspected EXAM: CT HEAD WITHOUT CONTRAST TECHNIQUE: Contiguous axial images were obtained from the base of the skull through the vertex without intravenous contrast. COMPARISON:  Two days ago FINDINGS: Brain: Acute hematoma centered in the left thalamus and adjacent white matter, 3.1 cm in maximal span on axial images, unchanged when remeasured in a similar fashion. Intraventricular hemorrhage seen at the lateral ventricles with stable mild ventriculomegaly. Chronic small vessel ischemic type low-density in the hemispheric white matter. No evidence of acute cortical infarct. Small remote right cerebellar infarct. Regional mass effect from the hematoma is unchanged. Vascular: Atheromatous calcification Skull: Negative Sinuses/Orbits: Nasal intubation. IMPRESSION: 1. No progression of the left thalamic or intraventricular hemorrhage. 2. Unchanged ventriculomegaly. Electronically Signed  By: Monte Fantasia M.D.   On: 01/29/2021 10:21   CT Head Wo Contrast  Result Date: 01/27/2021 CLINICAL DATA:  Follow-up hemorrhagic stroke involving the LEFT thalamus with extension  into the ventricular system. EXAM: CT HEAD WITHOUT CONTRAST TECHNIQUE: Contiguous axial images were obtained from the base of the skull through the vertex without intravenous contrast. COMPARISON:  01/25/2021 and earlier. FINDINGS: Brain: Hematoma involving the LEFT thalamus measuring approximately 2.6 x 2.8 x 2.8 cm, unchanged in size since the initial 01/23/2021 CT. Interval decrease in the amount of intraventricular hemorrhage in the lateral ventricles and the third ventricle. Mass-effect with midline shift of approximately 7 mm to the RIGHT, unchanged. No new hemorrhage or hematoma. No extra-axial fluid collections. Vascular: Mild BILATERAL carotid siphon and vertebral artery atherosclerosis. Hyperdense vessel. Skull: No skull fracture or other focal osseous abnormality involving the skull. Sinuses/Orbits: Paranasal sinuses, mastoid air cells and middle ear cavities well-aerated. Benign senile scleral calcifications in both eyes. Other: None. IMPRESSION: 1. Stable LEFT thalamic hematoma and associated midline shift to the RIGHT of approximately 7 mm. 2. Decreasing intraventricular hemorrhage. 3. No new intracranial abnormality. Electronically Signed   By: Evangeline Dakin M.D.   On: 01/27/2021 10:55   CT HEAD WO CONTRAST  Result Date: 01/25/2021 CLINICAL DATA:  Follow-up examination for acute stroke. EXAM: CT HEAD WITHOUT CONTRAST TECHNIQUE: Contiguous axial images were obtained from the base of the skull through the vertex without intravenous contrast. COMPARISON:  Prior CT from 01/23/2021. FINDINGS: Brain: Intraparenchymal hemorrhage centered at the left thalamus is not significantly changed in size or morphology as compared to previous exam. Surrounding edema is stable to mildly increased. Similar regional mass effect with localized left-to-right shift. Intraventricular extension with blood within the lateral and third ventricles, relatively stable. Stable ventricular size and morphology without  hydrocephalus or trapping. Basilar cisterns remain patent. No new intracranial hemorrhage or large vessel territory infarct. No extra-axial collection. Underlying atrophy with chronic microvascular ischemic disease again noted. Small remote right cerebellar infarcts noted as well. Vascular: No hyperdense vessel. Scattered vascular calcifications noted within the carotid siphons. Skull: Scalp soft tissues and calvarium demonstrate no acute finding. Sinuses/Orbits: Globes and orbital soft tissues demonstrate no acute finding. Mild scattered mucosal thickening noted within the ethmoidal air cells and maxillary sinuses. Paranasal sinuses are otherwise clear. Mastoid air cells remain largely clear. Other: None. IMPRESSION: 1. No significant interval change in size and morphology of left thalamic hemorrhage. Surrounding edema has mildly increased from prior, although regional mass effect and localized left-to-right shift is not significantly changed. 2. Intraventricular extension with blood within the lateral and third ventricles, stable. Stable ventricular size and morphology without hydrocephalus or trapping. 3. No other new acute intracranial abnormality. Electronically Signed   By: Jeannine Boga M.D.   On: 01/25/2021 04:19   CT HEAD WO CONTRAST  Result Date: 01/23/2021 CLINICAL DATA:  Follow-up intracranial hemorrhage EXAM: CT HEAD WITHOUT CONTRAST TECHNIQUE: Contiguous axial images were obtained from the base of the skull through the vertex without intravenous contrast. COMPARISON:  CT from earlier in the same day. FINDINGS: Brain: Parenchymal hemorrhage is again noted in the region of the left thalamus. Some surrounding edema is noted. The hematoma measures approximately 3.0 x 2.4 cm and roughly stable from the prior exam. Some intraventricular component is again noted particularly in the left lateral ventricle. Chronic white matter ischemic changes are noted. No new focal hemorrhage is seen. Chronic right  cerebellar infarct is noted. Vascular: No hyperdense vessel or unexpected  calcification. Skull: Normal. Negative for fracture or focal lesion. Sinuses/Orbits: No acute finding. Other: None. IMPRESSION: Stable appearing left thalamic hemorrhage with intraventricular component. No new focal abnormality is noted. Electronically Signed   By: Inez Catalina M.D.   On: 01/23/2021 21:54   MR MRA HEAD WO CONTRAST  Result Date: 01/23/2021 CLINICAL DATA:  Initial evaluation for acute intracranial hemorrhage, headache. EXAM: MRI HEAD WITHOUT CONTRAST MRA HEAD WITHOUT CONTRAST TECHNIQUE: Multiplanar, multi-echo pulse sequences of the brain and surrounding structures were acquired without intravenous contrast. Angiographic images of the Circle of Willis were acquired using MRA technique without intravenous contrast. COMPARISON: No pertinent prior exam. COMPARISON:  Prior CT from earlier the same day as well as previous MRI from 06/23/2018. FINDINGS: MRI HEAD FINDINGS Brain: Examination moderately to severely degraded by motion artifact. Generalized age-related cerebral atrophy. Patchy and confluent T2/FLAIR hyperintensity within the periventricular and deep white matter both cerebral hemispheres most consistent with chronic small vessel ischemic disease. Few small remote right cerebellar infarcts noted. Previously identified acute intraparenchymal hemorrhage centered at the left thalamus again seen. Bleed measures 3.0 x 3.1 x 3.4 cm on this motion degraded exam (estimated volume 16 cc). No visible underlying mass lesion or other structure abnormality on this motion degraded noncontrast examination. Surrounding vasogenic edema with mild regional mass effect. Associated mild localized left-to-right shift measures up to 5 mm at the septum pellucidum. Associated intraventricular extension with blood within the left greater than right lateral ventricles. No hydrocephalus or ventricular trapping at this time. No other evidence for  acute intracranial hemorrhage. No other foci of susceptibility artifact to suggest chronic hemorrhage elsewhere within the brain. No other foci of restricted diffusion to suggest acute or subacute ischemia. Gray-white matter differentiation otherwise maintained. No mass lesion or mass effect elsewhere within the brain. No extra-axial fluid collection. Pituitary gland suprasellar region within normal limits. Vascular: Major intracranial vascular flow voids are maintained. Skull and upper cervical spine: Craniocervical junction within normal limits. Bone marrow signal intensity normal. No scalp soft tissue abnormality. Sinuses/Orbits: Patient status post ocular lens replacement on the right. Globes and orbital soft tissues demonstrate no acute finding. Paranasal sinuses are largely clear. Trace bilateral mastoid effusions, of doubtful significance. Inner ear structures grossly normal. Other: None. MRA HEAD FINDINGS ANTERIOR CIRCULATION: Examination degraded by motion artifact. Both internal carotid arteries patent to the termini without stenosis or other appreciable abnormality. A1 segments patent. Anterior communicating artery complex grossly within normal limits. Anterior cerebral arteries grossly patent to their distal aspects without appreciable stenosis. No obvious M1 stenosis. Grossly normal MCA bifurcations. Distal MCA branches well perfused and symmetric. POSTERIOR CIRCULATION: Both V4 segments patent to the vertebrobasilar junction without stenosis. Left vertebral artery dominant. Left PICA origin patent and normal. Right PICA origin not definitely seen. Basilar patent to its distal aspect without stenosis. Superior cerebellar arteries patent bilaterally. Both PCAs primarily supplied via the basilar. PCAs perfused to their distal aspects without appreciable stenosis. No intracranial aneurysm. No vascular abnormality seen underlying the acute left thalamic hemorrhage. IMPRESSION: MRI HEAD IMPRESSION: 1. Motion  degraded exam. 2. 3.0 x 3.1 x 3.4 cm acute intraparenchymal hemorrhage centered at the left thalamus. Surrounding vasogenic edema with mild regional mass effect and 5 mm of left-to-right shift. No visible underlying mass lesion or other structure abnormality on this motion degraded exam. 3. Associated intraventricular extension with blood within the left greater than right lateral ventricles. No hydrocephalus or ventricular trapping at this time. 4. Underlying age-related cerebral atrophy with mild chronic  small vessel ischemic disease, with a few small remote right cerebellar infarcts. MRA HEAD IMPRESSION: 1. Technically limited exam due to extensive motion artifact. 2. Grossly negative intracranial MRA. No large vessel occlusion, hemodynamically significant stenosis, or other acute vascular abnormality. No vascular abnormality seen underlying the acute left thalamic hemorrhage. Please note that while a MRA neck was also ordered for this exam, this was unable to be performed due to the patient's inability to tolerate the full length of the study and extensive motion artifact. Electronically Signed   By: Jeannine Boga M.D.   On: 01/23/2021 21:41   MR ANGIO NECK W WO CONTRAST  Result Date: 01/23/2021 CLINICAL DATA:  Initial evaluation for acute intracranial hemorrhage, headache. EXAM: MRI HEAD WITHOUT CONTRAST MRA HEAD WITHOUT CONTRAST TECHNIQUE: Multiplanar, multi-echo pulse sequences of the brain and surrounding structures were acquired without intravenous contrast. Angiographic images of the Circle of Willis were acquired using MRA technique without intravenous contrast. COMPARISON: No pertinent prior exam. COMPARISON:  Prior CT from earlier the same day as well as previous MRI from 06/23/2018. FINDINGS: MRI HEAD FINDINGS Brain: Examination moderately to severely degraded by motion artifact. Generalized age-related cerebral atrophy. Patchy and confluent T2/FLAIR hyperintensity within the periventricular  and deep white matter both cerebral hemispheres most consistent with chronic small vessel ischemic disease. Few small remote right cerebellar infarcts noted. Previously identified acute intraparenchymal hemorrhage centered at the left thalamus again seen. Bleed measures 3.0 x 3.1 x 3.4 cm on this motion degraded exam (estimated volume 16 cc). No visible underlying mass lesion or other structure abnormality on this motion degraded noncontrast examination. Surrounding vasogenic edema with mild regional mass effect. Associated mild localized left-to-right shift measures up to 5 mm at the septum pellucidum. Associated intraventricular extension with blood within the left greater than right lateral ventricles. No hydrocephalus or ventricular trapping at this time. No other evidence for acute intracranial hemorrhage. No other foci of susceptibility artifact to suggest chronic hemorrhage elsewhere within the brain. No other foci of restricted diffusion to suggest acute or subacute ischemia. Gray-white matter differentiation otherwise maintained. No mass lesion or mass effect elsewhere within the brain. No extra-axial fluid collection. Pituitary gland suprasellar region within normal limits. Vascular: Major intracranial vascular flow voids are maintained. Skull and upper cervical spine: Craniocervical junction within normal limits. Bone marrow signal intensity normal. No scalp soft tissue abnormality. Sinuses/Orbits: Patient status post ocular lens replacement on the right. Globes and orbital soft tissues demonstrate no acute finding. Paranasal sinuses are largely clear. Trace bilateral mastoid effusions, of doubtful significance. Inner ear structures grossly normal. Other: None. MRA HEAD FINDINGS ANTERIOR CIRCULATION: Examination degraded by motion artifact. Both internal carotid arteries patent to the termini without stenosis or other appreciable abnormality. A1 segments patent. Anterior communicating artery complex  grossly within normal limits. Anterior cerebral arteries grossly patent to their distal aspects without appreciable stenosis. No obvious M1 stenosis. Grossly normal MCA bifurcations. Distal MCA branches well perfused and symmetric. POSTERIOR CIRCULATION: Both V4 segments patent to the vertebrobasilar junction without stenosis. Left vertebral artery dominant. Left PICA origin patent and normal. Right PICA origin not definitely seen. Basilar patent to its distal aspect without stenosis. Superior cerebellar arteries patent bilaterally. Both PCAs primarily supplied via the basilar. PCAs perfused to their distal aspects without appreciable stenosis. No intracranial aneurysm. No vascular abnormality seen underlying the acute left thalamic hemorrhage. IMPRESSION: MRI HEAD IMPRESSION: 1. Motion degraded exam. 2. 3.0 x 3.1 x 3.4 cm acute intraparenchymal hemorrhage centered at the left thalamus.  Surrounding vasogenic edema with mild regional mass effect and 5 mm of left-to-right shift. No visible underlying mass lesion or other structure abnormality on this motion degraded exam. 3. Associated intraventricular extension with blood within the left greater than right lateral ventricles. No hydrocephalus or ventricular trapping at this time. 4. Underlying age-related cerebral atrophy with mild chronic small vessel ischemic disease, with a few small remote right cerebellar infarcts. MRA HEAD IMPRESSION: 1. Technically limited exam due to extensive motion artifact. 2. Grossly negative intracranial MRA. No large vessel occlusion, hemodynamically significant stenosis, or other acute vascular abnormality. No vascular abnormality seen underlying the acute left thalamic hemorrhage. Please note that while a MRA neck was also ordered for this exam, this was unable to be performed due to the patient's inability to tolerate the full length of the study and extensive motion artifact. Electronically Signed   By: Jeannine Boga M.D.    On: 01/23/2021 21:41   MR BRAIN WO CONTRAST  Result Date: 01/23/2021 CLINICAL DATA:  Initial evaluation for acute intracranial hemorrhage, headache. EXAM: MRI HEAD WITHOUT CONTRAST MRA HEAD WITHOUT CONTRAST TECHNIQUE: Multiplanar, multi-echo pulse sequences of the brain and surrounding structures were acquired without intravenous contrast. Angiographic images of the Circle of Willis were acquired using MRA technique without intravenous contrast. COMPARISON: No pertinent prior exam. COMPARISON:  Prior CT from earlier the same day as well as previous MRI from 06/23/2018. FINDINGS: MRI HEAD FINDINGS Brain: Examination moderately to severely degraded by motion artifact. Generalized age-related cerebral atrophy. Patchy and confluent T2/FLAIR hyperintensity within the periventricular and deep white matter both cerebral hemispheres most consistent with chronic small vessel ischemic disease. Few small remote right cerebellar infarcts noted. Previously identified acute intraparenchymal hemorrhage centered at the left thalamus again seen. Bleed measures 3.0 x 3.1 x 3.4 cm on this motion degraded exam (estimated volume 16 cc). No visible underlying mass lesion or other structure abnormality on this motion degraded noncontrast examination. Surrounding vasogenic edema with mild regional mass effect. Associated mild localized left-to-right shift measures up to 5 mm at the septum pellucidum. Associated intraventricular extension with blood within the left greater than right lateral ventricles. No hydrocephalus or ventricular trapping at this time. No other evidence for acute intracranial hemorrhage. No other foci of susceptibility artifact to suggest chronic hemorrhage elsewhere within the brain. No other foci of restricted diffusion to suggest acute or subacute ischemia. Gray-white matter differentiation otherwise maintained. No mass lesion or mass effect elsewhere within the brain. No extra-axial fluid collection. Pituitary  gland suprasellar region within normal limits. Vascular: Major intracranial vascular flow voids are maintained. Skull and upper cervical spine: Craniocervical junction within normal limits. Bone marrow signal intensity normal. No scalp soft tissue abnormality. Sinuses/Orbits: Patient status post ocular lens replacement on the right. Globes and orbital soft tissues demonstrate no acute finding. Paranasal sinuses are largely clear. Trace bilateral mastoid effusions, of doubtful significance. Inner ear structures grossly normal. Other: None. MRA HEAD FINDINGS ANTERIOR CIRCULATION: Examination degraded by motion artifact. Both internal carotid arteries patent to the termini without stenosis or other appreciable abnormality. A1 segments patent. Anterior communicating artery complex grossly within normal limits. Anterior cerebral arteries grossly patent to their distal aspects without appreciable stenosis. No obvious M1 stenosis. Grossly normal MCA bifurcations. Distal MCA branches well perfused and symmetric. POSTERIOR CIRCULATION: Both V4 segments patent to the vertebrobasilar junction without stenosis. Left vertebral artery dominant. Left PICA origin patent and normal. Right PICA origin not definitely seen. Basilar patent to its distal aspect without stenosis. Superior  cerebellar arteries patent bilaterally. Both PCAs primarily supplied via the basilar. PCAs perfused to their distal aspects without appreciable stenosis. No intracranial aneurysm. No vascular abnormality seen underlying the acute left thalamic hemorrhage. IMPRESSION: MRI HEAD IMPRESSION: 1. Motion degraded exam. 2. 3.0 x 3.1 x 3.4 cm acute intraparenchymal hemorrhage centered at the left thalamus. Surrounding vasogenic edema with mild regional mass effect and 5 mm of left-to-right shift. No visible underlying mass lesion or other structure abnormality on this motion degraded exam. 3. Associated intraventricular extension with blood within the left  greater than right lateral ventricles. No hydrocephalus or ventricular trapping at this time. 4. Underlying age-related cerebral atrophy with mild chronic small vessel ischemic disease, with a few small remote right cerebellar infarcts. MRA HEAD IMPRESSION: 1. Technically limited exam due to extensive motion artifact. 2. Grossly negative intracranial MRA. No large vessel occlusion, hemodynamically significant stenosis, or other acute vascular abnormality. No vascular abnormality seen underlying the acute left thalamic hemorrhage. Please note that while a MRA neck was also ordered for this exam, this was unable to be performed due to the patient's inability to tolerate the full length of the study and extensive motion artifact. Electronically Signed   By: Jeannine Boga M.D.   On: 01/23/2021 21:41   DG Chest Port 1 View  Result Date: 01/27/2021 CLINICAL DATA:  Code stroke EXAM: PORTABLE CHEST 1 VIEW COMPARISON:  01/26/2021 FINDINGS: Esophageal tube tip coiled within the proximal stomach. No focal opacity or pleural effusion. Stable cardiomediastinal silhouette with borderline cardiomegaly. No pneumothorax. IMPRESSION: No active disease. Electronically Signed   By: Donavan Foil M.D.   On: 01/27/2021 18:38   DG Chest Port 1 View  Result Date: 01/26/2021 CLINICAL DATA:  Fever, code stroke EXAM: PORTABLE CHEST 1 VIEW COMPARISON:  Radiograph 08/13/2011 FINDINGS: Unchanged cardiomediastinal silhouette. There is no focal airspace disease. There is no pleural effusion or visible pneumothorax. There is no acute osseous abnormality. There is a at weighted tip enteric tube overlying the stomach. IMPRESSION: No evidence of acute cardiopulmonary disease. Electronically Signed   By: Maurine Simmering   On: 01/26/2021 10:06   EEG adult  Result Date: 01/27/2021 Lora Havens, MD     01/27/2021 11:40 AM Patient Name: REDFORD BEHRLE MRN: [id number] Epilepsy Attending: Lora Havens Referring Physician/Provider: Dr.  Antony Contras Date: 01/27/2021 Duration: 23.10 minutes Patient history: 85 year old male with left thalamic hemorrhage.  EEG to evaluate for seizures. Level of alertness: Awake AEDs during EEG study: Gabapentin Technical aspects: This EEG study was done with scalp electrodes positioned according to the 10-20 International system of electrode placement. Electrical activity was acquired at a sampling rate of 500Hz  and reviewed with a high frequency filter of 70Hz  and a low frequency filter of 1Hz . EEG data were recorded continuously and digitally stored. Description: The posterior dominant rhythm consists of 8 Hz activity of moderate voltage (25-35 uV) seen predominantly in posterior head regions, symmetric and reactive to eye opening and eye closing.  EEG showed continuous rhythmic sharply contoured 3 to 5 Hz theta- delta slowing in left frontotemporal region hyperventilation and photic stimulation were not performed.   ABNORMALITY -Continuous slow, left frontotemporal region IMPRESSION: This study is suggestive of cortical dysfunction in left frontotemporal region likely secondary to underlying bleed. No seizures or definite epileptiform discharges were seen throughout the recording. Lora Havens   ECHOCARDIOGRAM COMPLETE  Result Date: 01/24/2021    ECHOCARDIOGRAM REPORT   Patient Name:   [place] Date of Exam:  01/24/2021 Medical Rec #:  [id number]     Height:       72.0 in Accession #:    [id number]    Weight:       215.6 lb Date of Birth:  Sep 27, 1935     BSA:          2.200 m Patient Age:    46 years      BP:           143/92 mmHg Patient Gender: M             HR:           77 bpm. Exam Location:  Inpatient Procedure: 2D Echo, Cardiac Doppler and Color Doppler Indications:    Stroke I63.9  History:        Patient has prior history of Echocardiogram examinations, most                 recent 04/02/2018. Stroke, Signs/Symptoms:Chest Pain; Risk                 Factors:Hypertension, Diabetes and Dyslipidemia.   Sonographer:    Tiffany Dance Referring Phys: [id number] [place]  Sonographer Comments: Suboptimal subcostal window. IMPRESSIONS  1. Left ventricular ejection fraction, by estimation, is 55 to 60%. The left ventricle has normal function. The left ventricle has no regional wall motion abnormalities. Left ventricular diastolic parameters are consistent with Grade I diastolic dysfunction (impaired relaxation).  2. Right ventricular systolic function is normal. The right ventricular size is normal.  3. Left atrial size was mildly dilated.  4. The mitral valve is normal in structure. No evidence of mitral valve regurgitation. No evidence of mitral stenosis.  5. The aortic valve is normal in structure. Aortic valve regurgitation is not visualized. Mild aortic valve sclerosis is present, with no evidence of aortic valve stenosis.  6. The inferior vena cava is normal in size with greater than 50% respiratory variability, suggesting right atrial pressure of 3 mmHg. Comparison(s): Prior images unable to be directly viewed, comparison made by report only. Conclusion(s)/Recommendation(s): No intracardiac source of embolism detected on this transthoracic study. Consider saline contrast study or TEE to evaluate the atrial septal aneurysm for evidence of PFO/shunting. FINDINGS  Left Ventricle: Left ventricular ejection fraction, by estimation, is 55 to 60%. The left ventricle has normal function. The left ventricle has no regional wall motion abnormalities. The left ventricular internal cavity size was normal in size. There is  borderline concentric left ventricular hypertrophy. Left ventricular diastolic parameters are consistent with Grade I diastolic dysfunction (impaired relaxation). Indeterminate filling pressures. Right Ventricle: The right ventricular size is normal. No increase in right ventricular wall thickness. Right ventricular systolic function is normal. Left Atrium: Left atrial size was mildly dilated. Right  Atrium: Right atrial size was normal in size. Pericardium: There is no evidence of pericardial effusion. Mitral Valve: The mitral valve is normal in structure. Mild mitral annular calcification. No evidence of mitral valve regurgitation. No evidence of mitral valve stenosis. Tricuspid Valve: The tricuspid valve is normal in structure. Tricuspid valve regurgitation is not demonstrated. No evidence of tricuspid stenosis. Aortic Valve: The aortic valve is normal in structure. Aortic valve regurgitation is not visualized. Mild aortic valve sclerosis is present, with no evidence of aortic valve stenosis. Pulmonic Valve: The pulmonic valve was normal in structure. Pulmonic valve regurgitation is not visualized. No evidence of pulmonic stenosis. Aorta: The aortic root is normal in size and structure. Venous: The inferior vena cava is  normal in size with greater than 50% respiratory variability, suggesting right atrial pressure of 3 mmHg. IAS/Shunts: The interatrial septum is aneurysmal. No atrial level shunt detected by color flow Doppler.  LEFT VENTRICLE PLAX 2D LVIDd:         4.00 cm     Diastology LVIDs:         3.00 cm     LV e' medial:    3.70 cm/s LV PW:         1.30 cm     LV E/e' medial:  14.9 LV IVS:        1.20 cm     LV e' lateral:   6.64 cm/s LVOT diam:     2.40 cm     LV E/e' lateral: 8.3 LV SV:         80 LV SV Index:   36 LVOT Area:     4.52 cm  LV Volumes (MOD) LV vol d, MOD A2C: 73.3 ml LV vol d, MOD A4C: 82.0 ml LV vol s, MOD A2C: 28.7 ml LV vol s, MOD A4C: 35.1 ml LV SV MOD A2C:     44.6 ml LV SV MOD A4C:     82.0 ml LV SV MOD BP:      46.3 ml RIGHT VENTRICLE             IVC RV Basal diam:  1.80 cm     IVC diam: 1.60 cm RV S prime:     13.50 cm/s TAPSE (M-mode): 2.3 cm LEFT ATRIUM             Index       RIGHT ATRIUM           Index LA diam:        4.00 cm 1.82 cm/m  RA Area:     12.10 cm LA Vol (A2C):   74.9 ml 34.05 ml/m RA Volume:   22.20 ml  10.09 ml/m LA Vol (A4C):   32.2 ml 14.64 ml/m LA  Biplane Vol: 50.7 ml 23.05 ml/m  AORTIC VALVE LVOT Vmax:   94.20 cm/s LVOT Vmean:  64.400 cm/s LVOT VTI:    0.176 m  AORTA Ao Root diam: 3.80 cm Ao Asc diam:  3.80 cm MITRAL VALVE MV Area (PHT): 3.12 cm    SHUNTS MV Decel Time: 243 msec    Systemic VTI:  0.18 m MV E velocity: 55.30 cm/s  Systemic Diam: 2.40 cm MV A velocity: 97.30 cm/s MV E/A ratio:  0.57 Mihai Croitoru MD Electronically signed by Sanda Klein MD Signature Date/Time: 01/24/2021/4:17:34 PM    Final    CT HEAD CODE STROKE WO CONTRAST  Result Date: 01/23/2021 CLINICAL DATA:  Code stroke.  Left-sided facial droop EXAM: CT HEAD WITHOUT CONTRAST TECHNIQUE: Contiguous axial images were obtained from the base of the skull through the vertex without intravenous contrast. COMPARISON:  2019 FINDINGS: Brain: There is acute hemorrhage centered within the left thalamus measuring 2.6 x 2.8 x 3 cm. Surrounding edema is present. There is partial effacement of the third ventricle. Intraventricular extension is present with hemorrhage seen within the left lateral and third ventricles. Gray-white differentiation is preserved. Patchy and confluent areas of hypoattenuation in the supratentorial white matter are nonspecific but probably reflect moderate chronic microvascular ischemic changes. Small chronic right cerebellar infarct. Prominence of the ventricles and sulci reflects generalized parenchymal volume loss. Vascular: There is intracranial atherosclerotic calcification at the skull base. Skull: Unremarkable. Sinuses/Orbits: Paranasal sinus mucosal thickening.  No acute orbital abnormality. Other: Mastoid air cells are clear. IMPRESSION: Acute left thalamic hemorrhage with mild regional mass effect and intraventricular extension. No hydrocephalus. Chronic microvascular ischemic changes. Small chronic right cerebellar infarct. Initial results were communicated to Dr. Lorrin Goodell at 3:42 pm on 01/23/2021 by text page via the [facility] messaging system. Electronically  Signed   By: Macy Mis M.D.   On: 01/23/2021 15:45    PHYSICAL EXAM  Temp:  [98.3 F (36.8 C)-100.1 F (37.8 C)] 98.7 F (37.1 C) (05/16 0800) Pulse Rate:  [69-149] 79 (05/16 0730) Resp:  [11-23] 16 (05/16 0730) BP: (106-176)/(59-83) 173/75 (05/16 0730) SpO2:  [92 %-97 %] 95 % (05/16 0730) Weight:  [103.2 kg] 103.2 kg (05/16 0400)  General - Well nourished, well developed, in no apparent distress.  Ophthalmologic - fundi not visualized due to noncooperation.  Cardiovascular - Regular rhythm and rate, not in afib now.  Neuro - awake, alert, eyes open, global aphasia except able to close eyes and lift left leg on command but not other commands. Not able to name and repeat. Left gaze preference but able to cross midline with right gaze, tracking bilaterally, still not blinking to visual threat on the right, PERRL. Right facial droop. Tongue protrusion not cooperative. LUE able to hold against gravity. LLE able to briefly lift up against gravity with spontaneous movement. RUE and RLE 0/5, RUE increased muscle tone. Sensation, coordination and gait not tested.    ASSESSMENT/PLAN Ayoub K Flowersis a 85 y.o.malewith history of A. fib on Eliquis, hypertension, hyperlipidemia, diabetes admitted for L Thalamic ICH withIVH and an ICH score of 2. He is s/p Andexanet alpha for presumed ICH on Eliquis.  ICH -left thalamic ICH and IVH with associated cerebral edema in the setting hypertensive emergency and apixaban therapy for PAF  01/23/2021 head CT acute left thalamic ICH and intraventricular extension, no hydrocephalus  MRI head: 3.0 x 3.1 x 3.4 cm acute intraparenchymal hemorrhage centered at the left thalamus. Mild regional mass effect and 5 mm of left-to-right shift.  MRA head: Grossly negative, No LVO  CT repeat 5/15 increased size of 3rd ventricle and frontal and temproal horn  CT repeat 5/16 decreased size of 3rd ventricle and frontal and temporal horn   CUS pending  2D Echo:  EF 55-60%,   Repeat CT Head 5/11 and 5/13 - stable IPH, edema and mass effect 59mm MLS.   VTE prophylaxis -Lovenox  On Eliquis for known PAF prior to admission, no anticoagulant or antiplatelets for now.   Therapy recommendations:  SNF  Disposition:  TBD  Cerebral edema  CT repeat showed stable ICH and IVH with 62mm MLS  on 3% saline -> tapered off  Na 138-142-147->153  Allow Na gradually trending down  CT repeat 5/15 increased size of 3rd ventricle and frontal and temproal horn  CT repeat 5/16 decreased size of 3rd ventricle and frontal and temporal horn  PAF SVT x 3  Home medication: Eliquis, reversed with Andexxa  Followed by Dr. Harrington Challenger  2019 Echo showed EF 45-50%  now with improved EF 55-60%  On amiodarone IV -> will change to po 400mg  bid once stable  On coreg 12.5 bid -> 25 bid  No antithrombotic now  Cardiology on board, appreciate help  Hypertension  Home meds: Norvasc 10, Coreg 12.5, Cozaar 100mg    Off Cleviprex  On Coreg 12.5 -> 25  amlodipine 10 and losartan 50 bid  BP goal < 160  Long term goal normotensive  Hyperlipidemia  Home meds:  Lipitor 80mg   LDL 55, goal < 70  Hold statin in setting of current ICH    Consider to resume statin at discharge  DM2, controlled  Home meds are glargine 14 u hs, metformin 1000 BID, Januvia 100 daily  HgbA1c 5.8, goal < 7.0  CBGs  SSI  Dysphagia Feeding  Failed swallow eval 5/11, NPO now  Cortrack tube placed  Jevity @ 60cc/hr  Speech to follow  Need PEG if going to SNF  AKI, resolved  Cre 1.22->1.39->1.11->0.98->0.90->0.95  Setting of severe hypertension  Avoid nephrotoxins  On tube feeding and IV fluid  Fever Leukocytosis, improved Copious secretion  Tmax 101.7-> afebrile  Monitor fever curve  CXR: no acute findings  CBC with leukocytosis wbc 13-> 8.8->6.8->8.7  UA: rare bacteria,  Urine Culture negative  Chest xray pending  On unasyn    Other Stroke  Risk Factors  Advanced Age >/= 84   Former Cigarette smoker quit 1995.   Current ETOH use, advised to drink no more than 2 drink(s) a day  Hx stroke/TIA  Other Active Problems  Constipation - on senokot, will give dulcolax suppository   Hospital day # 7  This patient is critically ill due to [place] on eliquis, chronic afib, SVT x3, cerebral edema and hydrocephalus and at significant risk of neurological worsening, death form heart failure, hematoma expansion, obstructive hydrocephalus, brain herniation, sepsis. This patient's care requires constant monitoring of vital signs, hemodynamics, respiratory and cardiac monitoring, review of multiple databases, neurological assessment, discussion with family, other specialists and medical decision making of high complexity. I spent 35 minutes of neurocritical care time in the care of this patient. I discussed with Dr. Tacy Learn CCM  Rosalin Hawking, MD PhD Stroke Neurology 01/30/2021 9:24 AM    To contact Stroke Continuity provider, please refer to [email or web address] After hours, contact General Neurology

## 2021-01-30 NOTE — Progress Notes (Signed)
NAME:  Brian [name], MRN:  [id number], DOB:  12-19-1935, LOS: 7 ADMISSION DATE:  01/23/2021, CONSULTATION DATE: 5/13 REFERRING MD:  Dr. Stroke, CHIEF COMPLAINT: ICH  History of Present Illness:   Patient is 85 yo AA male admitted [facility] on [facility]/23/2021 for acute onset right sided weakness and language deficit. Pertinent PMH of HTN, HLD, EF 45-50% on 2019, T2DM, Chronic anemia, Afib on Eliquis at home, prior L MCa Stroke. CTH w/o contrast and MRI on 5/9 showing L thalamic ICH with cytoxic edema/with mild 68mm left to right shift. Patient was anticoagulated with Apixaban for A-fib which was reversed in ED with Andexanet Alpha. Also requiring Cleviprex infusion for hypertensive emergency. Admitted to Neuro ICU under the stroke service.   Course complicated with ongoing HTN requiring Cleviprex infusion. Mentation initially somewhat improving. Patient developed fever with increased lethargy on 5/12. Failed swallow eval and core-track was placed. Cleviprex stopped on 5/12 and started on oral anti hypertensives. On 5/13 low grade fever persist with downtrending WBC. EEG shows focal left-sided slowing but no focal seizures. Hypertonic saline started on 5/13. Patient developed HR in the 150-160s and PCCM consulted.   Pertinent  Medical History  Stroke  Obesity HTN HLD Type 2 diabetes  Chronic anemia A-fib with chronic anticoagulation  Significant Hospital Events: Including procedures, antibiotic start and stop dates in addition to other pertinent events   . 5/9- admitted for ICH.  [name] 5/9- CT head w/o: acute left thalamic hemorrhage with mild mass effect. . 5/9 MR brain w/o: 3.0 x 3.1 x 3.4 cm acute intraparenchymal hemorrhage centered at the left thalamus. Surrounding vasogenic edema with mild regional mass effect and 5 mm of left-to-right shift. . 5/11 CT Head w/o: No significant change. Surrounding edema mildly increased.  . 5/13 Increasing lethargy. Repeat CT showed increasing midline shift.  Hypertonic saline initiated.  . 5/14 developed atrial fibrillation with rapid ventricular rate with a rate of 1 50-1 60, IV amiodarone drip started . 5/15 less R sided neglect, moving out of ICU  Interim History / Subjective:  Patient is unable to clear oral/oropharyngeal secretions, needed nasal suction yesterday Remained afebrile  Objective   Blood pressure (!) 147/62, pulse 73, temperature 98.7 F (37.1 C), temperature source Axillary, resp. rate (!) 23, height 6' (1.829 m), weight 103.2 kg, SpO2 96 %.        Intake/Output Summary (Last 24 hours) at 01/30/2021 1114 Last data filed at 01/30/2021 1000 Gross per 24 hour  Intake 1923.27 ml  Output 1400 ml  Net 523.27 ml   Filed Weights   01/28/21 0400 01/29/21 0400 01/30/21 0400  Weight: 101.5 kg 104.3 kg 103.2 kg    Examination: General: Critically ill, elderly African-American male lying in the bed HEENT: [place]/AT, eyes anicteric, NGT in place Neuro: Opens eyes with vocal stimuli, tracking examiner, left-sided gaze deviation, does not cross midline.  Intermittently following few commands CV: S1S2, RRR PULM: rhonchi bilaterally, saturating in mid-90s on RA GI: soft, NT, ND Extremities: warm, mild pedal edema worse on R Skin: no rashes or wounds  Labs/imaging that I have personally reviewed    Na+ 153 BUN 27 Cr 0.95 WBC 8.7 H/H 10.6/ 35.5  Resolved Hospital Problem list   AKI  Assessment & Plan:   ICH on eliquis; s/p andexant alpha Left thalamic ICH with 7 mm midline shift Induced hypernatremia Hypertensive emergency, remains very resistant to escalating meds Stroke team is following Appreciate stroke team's management  Continue to monitor serum sodium as  it drifts down SBP goal less than 160- increasing coreg and losartan today Continue amlodipine Con't PRN labetalol and hydralazine  Neuroprotective measures Aspiration precautions- NTS this morning; had a strong cough Con't statin, holding eliquis.  SVT,  recurrent overnight Developed 5/14 HR in 170s with narrow complex when arrived to bedside. Treated with 6 mg, then 12 mg of Adenosine.  Patient again seen with SVT versus A. fib RVR on 5/14 with a rate of 160 History of Atrial Fibrillation Chronically anticoagulated on Eliquis, this was reversed on arrival  Holding anticoagulation due to [facility] Cardiology is following, currently on Coreg 25 mg twice daily Con't amiodarone infusion for now; hopefully can transition to PO if no repeat episodes Continuous telemetry  Possible aspiration pneumonia Complete 7 days of unasyn (day #4) Remained afebrile with no leukocytosis Continue tube feeds via cortrak Aspiration precautions, NTS PRN  Diabetes type 2 Fingersticks are well controlled now Continue levemir 15 units BID Con't TF coverage 5 units Q4h with hold parameters SSI PRN CBG goal 140-180  Dysphagi Continue tube feeds via core track Aspiration precautions SLP following NTS PRN  Acute anemia likely due to critical illness -transfuse for Hb <7 or hemodynamically significant bleeding  -monitor  Best practice   Diet:  Tube Feed  Pain/Anxiety/Delirium protocol (if indicated): No VAP protocol (if indicated): Not indicated DVT prophylaxis: Lovenox GI prophylaxis: PPI Glucose control:  SSI Yes Central venous access:  N/A Arterial line:  N/A Foley:  N/A Mobility: Out of bed to chair PT consulted: Yes Last date of multidisciplinary goals of care discussion Per primary  Code Status:  full code Disposition: ICU    Total critical care time: 33 minutes  Performed by: [place] care time was exclusive of separately billable procedures and treating other patients.   Critical care was necessary to treat or prevent imminent or life-threatening deterioration.   Critical care was time spent personally by me on the following activities: development of treatment plan with patient and/or surrogate as well as nursing,  discussions with consultants, evaluation of patient's response to treatment, examination of patient, obtaining history from patient or surrogate, ordering and performing treatments and interventions, ordering and review of laboratory studies, ordering and review of radiographic studies, pulse oximetry and re-evaluation of patient's condition.   Jacky Kindle MD [place] [facility] See Amion for pager If no response to pager, please call [phone] until 7pm After 7pm, Please call E-link [phone]

## 2021-01-30 NOTE — Progress Notes (Signed)
Physical Therapy Treatment Patient Details Name: Brian [name] MRN: [id number] DOB: 03/02/1936 Today's Date: 01/30/2021    History of Present Illness Pt is a 85 y.o. M who presents with acute onset R sided weakness, facial droop, numbness, aphasia with left gaze preference. CTH showing L thalamic ICH. Significant PMH: afib, DM2, HLD, HTN, stroke.    PT Comments    The pt presents with improved alertness, visually tracking therapist and responding with head and eye movement to both therapist and familiar voices (brother). The pt's SBP was elevated initially, but dropped to desired level with elevation of HOB to 50 deg. Improved command following at start of session, completing multiple tasks such as "raise your arm", "shake my hand" and "look at [name]" (the pt's brother) with increased time (2-8 seconds). Pt probably followed ~50% of commands initially, and <10% after 30 min activity, no active command following with R extremities this session. The pt tolerated sitting EOB for 15 min with good engagement in reaching, head turning, and lateral leaning activities from sitting positions, but did need modA to Mark to maintain balance due to strong posterior and R lateral leaning at times. 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 BP remained within limits through session, but HR elevated to 145-155bpm by end of session, RN present and managing medically. Continue to recommend SNF level rehab following d/c.   Follow Up Recommendations  SNF     Equipment Recommendations  Other (comment) (defer to post acute)    Recommendations for Other Services       Precautions / Restrictions Precautions Precautions: Fall;Other (comment) Precaution Comments: R hemiparesis, global aphasia, cortrak Restrictions Weight Bearing Restrictions: No    Mobility  Bed Mobility Overal bed mobility: Needs Assistance Bed  Mobility: Rolling;Supine to Sit;Sit to Sidelying Rolling: Total assist;+2 for physical assistance   Supine to sit: +2 for physical assistance;Max assist   Sit to sidelying: Max assist;+2 for physical assistance General bed mobility comments: totalA for rolling, pt with some initiation of lateral movement of LLE with supine to sit, needing maxA to maintain sitting position. The pt was ablet to initiate sit to sidelying with cues and increased time to come down to L elbow and then to L shoulder, maxA to BLE to achieve sidelying.    Transfers Overall transfer level: Needs assistance   Transfers: Lateral/Scoot Transfers          Lateral/Scoot Transfers: Total assist;+2 physical assistance General transfer comment: attempted lateral scoot, pt able to follow cues for holding PT arm, forward lean to initiate hip lift, but did not engage with LE to assist with scoot or partial stand.     Modified Rankin (Stroke Patients Only) Modified Rankin (Stroke Patients Only) Pre-Morbid Rankin Score: Slight disability Modified Rankin: Severe disability     Balance Overall balance assessment: Needs assistance Sitting-balance support: Feet supported Sitting balance-Leahy Scale: Poor Sitting balance - Comments: between totalA and modA to maintain due to posterior lean and strong R pushing. attempted to decrease use of LUE by giving pt objects to hold and initiating lateral leans to L elbow. Postural control: Posterior lean;Right lateral lean (LOB in all directions without support)                                  Cognition Arousal/Alertness: Awake/alert Behavior During Therapy: Flat affect Overall Cognitive Status:  Impaired/Different from baseline Area of Impairment: Following commands                       Following Commands: Follows one step commands inconsistently;Follows one step commands with increased time       General Comments: pt following limited commands with  increased time on L side only. Improved command following at start of session, completing multiple tasks such as "raise your arm", "shake my hand" and "look at [name]" (the pt's brother) with increased time (2-8 seconds). pt probably followed ~50% of commands initially, and <10% after 30 min activity. no active command following with R extremities      Exercises Other Exercises Other Exercises: lateral leaning to L and R. support to R shoulder with R leaning, facilitated reaching with LUE to facilitate wt shift onto R side. Other Exercises: manual therapy to L upper traps and neck Other Exercises: cervical rotation from L to R with verbal cues and then manual overpressure to increase R rotation x10 reps    General Comments General comments (skin integrity, edema, etc.): SBP in 160s at start of session, with HOB elevated to 50 deg, BP dropeed to 150s. BP sitting EOB 148/97. HR elevated and sustaining 145-155bpm at end of session, RN present and addressing with medications.      Pertinent Vitals/Pain Pain Assessment: Faces Faces Pain Scale: Hurts even more Pain Location: grimacing with manual therapy and stretching to L upper traps Pain Descriptors / Indicators: Grimacing Pain Intervention(s): Monitored during session;Repositioned           PT Goals (current goals can now be found in the care plan section) Acute Rehab PT Goals Patient Stated Goal: none stated PT Goal Formulation: Patient unable to participate in goal setting Time For Goal Achievement: 02/07/21 Potential to Achieve Goals: Fair Progress towards PT goals: Progressing toward goals    Frequency    Min 3X/week      PT Plan Current plan remains appropriate    Co-evaluation              AM-PAC PT "6 Clicks" Mobility   Outcome Measure  Help needed turning from your back to your side while in a flat bed without using bedrails?: Total Help needed moving from lying on your back to sitting on the side of a flat  bed without using bedrails?: Total Help needed moving to and from a bed to a chair (including a wheelchair)?: Total Help needed standing up from a chair using your arms (e.g., wheelchair or bedside chair)?: Total Help needed to walk in hospital room?: Total Help needed climbing 3-5 steps with a railing? : Total 6 Click Score: 6    End of Session Equipment Utilized During Treatment: Gait belt Activity Tolerance: Patient limited by fatigue Patient left: in bed;with call bell/phone within reach;with family/visitor present;with nursing/sitter in room;with bed alarm set Nurse Communication: Mobility status PT Visit Diagnosis: Other abnormalities of gait and mobility (R26.89);Hemiplegia and hemiparesis Hemiplegia - Right/Left: Right Hemiplegia - dominant/non-dominant: Dominant Hemiplegia - caused by: Nontraumatic intracerebral hemorrhage     Time: 3825-0539 PT Time Calculation (min) (ACUTE ONLY): 29 min  Charges:  $Therapeutic Activity: 8-22 mins $Neuromuscular Re-education: 8-22 mins                     Hardie Pulley, DPT   Acute Rehabilitation Department Pager #: [phone]   Otho Bellows 01/30/2021, 10:09 AM

## 2021-01-30 NOTE — Progress Notes (Signed)
Carotid duplex has been completed.   Preliminary results in CV Proc.   Abram Sander 01/30/2021 10:13 AM

## 2021-01-31 DIAGNOSIS — I480 Paroxysmal atrial fibrillation: Secondary | ICD-10-CM

## 2021-01-31 DIAGNOSIS — I169 Hypertensive crisis, unspecified: Secondary | ICD-10-CM | POA: Diagnosis not present

## 2021-01-31 DIAGNOSIS — I619 Nontraumatic intracerebral hemorrhage, unspecified: Secondary | ICD-10-CM | POA: Diagnosis not present

## 2021-01-31 DIAGNOSIS — I610 Nontraumatic intracerebral hemorrhage in hemisphere, subcortical: Secondary | ICD-10-CM | POA: Diagnosis not present

## 2021-01-31 DIAGNOSIS — I471 Supraventricular tachycardia: Secondary | ICD-10-CM | POA: Diagnosis not present

## 2021-01-31 LAB — BASIC METABOLIC PANEL
Anion gap: 11 (ref 5–15)
BUN: 35 mg/dL — ABNORMAL HIGH (ref 8–23)
CO2: 26 mmol/L (ref 22–32)
Calcium: 9.5 mg/dL (ref 8.9–10.3)
Chloride: 116 mmol/L — ABNORMAL HIGH (ref 98–111)
Creatinine, Ser: 1.08 mg/dL (ref 0.61–1.24)
GFR, Estimated: >60 mL/min (ref >60)
Glucose, Bld: 73 mg/dL (ref 70–99)
Potassium: 4 mmol/L (ref 3.5–5.1)
Sodium: 153 mmol/L — ABNORMAL HIGH (ref 135–145)

## 2021-01-31 LAB — GLUCOSE, CAPILLARY
Glucose-Capillary: 128 mg/dL — ABNORMAL HIGH (ref 70–99)
Glucose-Capillary: 132 mg/dL — ABNORMAL HIGH (ref 70–99)
Glucose-Capillary: 170 mg/dL — ABNORMAL HIGH (ref 70–99)
Glucose-Capillary: 198 mg/dL — ABNORMAL HIGH (ref 70–99)
Glucose-Capillary: 198 mg/dL — ABNORMAL HIGH (ref 70–99)
Glucose-Capillary: 98 mg/dL (ref 70–99)

## 2021-01-31 LAB — CBC
HCT: 38.8 % — ABNORMAL LOW (ref 39.0–52.0)
Hemoglobin: 11.3 g/dL — ABNORMAL LOW (ref 13.0–17.0)
MCH: 25.5 pg — ABNORMAL LOW (ref 26.0–34.0)
MCHC: 29.1 g/dL — ABNORMAL LOW (ref 30.0–36.0)
MCV: 87.6 fL (ref 80.0–100.0)
Platelets: 171 10*3/uL (ref 150–400)
RBC: 4.43 MIL/uL (ref 4.22–5.81)
RDW: 15.1 % (ref 11.5–15.5)
WBC: 9.5 10*3/uL (ref 4.0–10.5)
nRBC: 0.4 % — ABNORMAL HIGH (ref 0.0–0.2)

## 2021-01-31 LAB — SODIUM: Sodium: 152 mmol/L — ABNORMAL HIGH (ref 135–145)

## 2021-01-31 MED ORDER — FREE WATER
100.0000 mL | Status: DC
  Administered 2021-01-31 – 2021-02-28 (×166): 100 mL

## 2021-01-31 MED ORDER — AMIODARONE HCL 200 MG PO TABS
400.0000 mg | ORAL_TABLET | Freq: Two times a day (BID) | ORAL | Status: AC
  Administered 2021-01-31 – 2021-02-06 (×14): 400 mg
  Filled 2021-01-31 (×14): qty 2

## 2021-01-31 MED ORDER — SODIUM CHLORIDE 0.9 % IV SOLN
INTRAVENOUS | Status: DC | PRN
  Administered 2021-01-31: 500 mL via INTRAVENOUS

## 2021-01-31 NOTE — Progress Notes (Signed)
Inpatient Diabetes Program Recommendations  AACE/ADA: New Consensus Statement on Inpatient Glycemic Control (2015)  Target Ranges:  Prepandial:   less than 140 mg/dL      Peak postprandial:   less than 180 mg/dL (1-2 hours)      Critically ill patients:  140 - 180 mg/dL   Lab Results  Component Value Date   GLUCAP 198 (H) 01/31/2021   HGBA1C 6.1 (H) 01/28/2021    Review of Glycemic Control  Inpatient Diabetes Program Recommendations:   Received consult regarding diabetes management. lab blood glucose decreased to 72 post tube feed coverage + Novolog tube feed coverage. Consider: -Decrease in Novolog correction to 0-3 units q 4 hrs.  Thank you, Nani Gasser. Mikesha Migliaccio, RN, MSN, CDE  Diabetes Coordinator Inpatient Glycemic Control Team Team Pager [phone] (8am-5pm) 01/31/2021 2:07 PM

## 2021-01-31 NOTE — Progress Notes (Signed)
Occupational Therapy Treatment Patient Details Name: Brian [name] MRN: [id number] DOB: 25-Feb-1936 Today's Date: 01/31/2021    History of present illness Pt is a 85 y.o. M who presents with acute onset R sided weakness, facial droop, numbness, aphasia with left gaze preference. CTH showing L thalamic ICH. Significant PMH: afib, DM2, HLD, HTN, stroke.   OT comments  Pt lifted OOB to the chair this session and tolerated well. Recommend OOB to chair for max 2 hours for skin integrity. Pt was incontinent of bowel and bladder this session with no awareness. Total +2 (A) for hygiene and noted to have some early signs of moisture break down at this time. Recommendation for SNF for d/c planning   Follow Up Recommendations  SNF;Supervision/Assistance - 24 hour    Equipment Recommendations  Wheelchair (measurements OT);Wheelchair cushion (measurements OT);Hospital bed    Recommendations for Other Services      Precautions / Restrictions Precautions Precautions: Fall;Other (comment) Precaution Comments: R hemiparesis, global aphasia, cortrak Restrictions Weight Bearing Restrictions: No       Mobility Bed Mobility Overal bed mobility: Needs Assistance Bed Mobility: Rolling Rolling: Total assist;+2 for physical assistance         General bed mobility comments: Total A for rolling to right and left for peri care and placement of maxi move sling    Transfers                      Balance Overall balance assessment: Needs assistance Sitting-balance support: Feet supported Sitting balance-Leahy Scale: Poor Sitting balance - Comments: Right lateral lean up in chair, use of pillow wedge to maintain neutral alignment Postural control: Right lateral lean                                 ADL either performed or assessed with clinical judgement   ADL Overall ADL's : Needs assistance/impaired                                       General ADL  Comments: total (A) for all adls. attempted to wash face. Pt taking wash cloth but resisting cloth toward face with hand over hand. pt reaching with L UE to scratch face and inspect cortrak at times     Vision       Perception     Praxis      Cognition Arousal/Alertness: Lethargic Behavior During Therapy: Flat affect Overall Cognitive Status: Impaired/Different from baseline Area of Impairment: Following commands                       Following Commands: Follows one step commands inconsistently       General Comments: Minimal command following today and nonverbal. Tendency to keep left eye closed, right eye opening minimally with min-mod stimulation and name call        Exercises Other Exercises Other Exercises: PROM R UE provided and flexion tone very present   Shoulder Instructions       General Comments VSS    Pertinent Vitals/ Pain       Pain Assessment: Faces Faces Pain Scale: No hurt  Home Living  Prior Functioning/Environment              Frequency  Min 2X/week        Progress Toward Goals  OT Goals(current goals can now be found in the care plan section)  Progress towards OT goals: Progressing toward goals  Acute Rehab OT Goals Patient Stated Goal: none stated OT Goal Formulation: Patient unable to participate in goal setting Time For Goal Achievement: 02/07/21 Potential to Achieve Goals: Good ADL Goals Pt Will Perform Grooming: with min assist;sitting Pt Will Transfer to Toilet: with max assist;with +2 assist;squat pivot transfer;bedside commode Pt/caregiver will Perform Home Exercise Program: Right Upper extremity;Increased ROM;Increased strength;With minimal assist Additional ADL Goal #1: Pt will attend to task x5 mins with moderate cues to stay alert. Additional ADL Goal #2: Pt will increase to sitting EOB x10 mins with minguardA for static sitting as precursor for ADL.   Plan Discharge plan remains appropriate    Co-evaluation      Reason for Co-Treatment: Necessary to address cognition/behavior during functional activity;For patient/therapist safety;Complexity of the patient's impairments (multi-system involvement);To address functional/ADL transfers PT goals addressed during session: Mobility/safety with mobility OT goals addressed during session: ADL's and self-care;Proper use of Adaptive equipment and DME;Strengthening/ROM      AM-PAC OT "6 Clicks" Daily Activity     Outcome Measure   Help from another person eating meals?: Total Help from another person taking care of personal grooming?: Total Help from another person toileting, which includes using toliet, bedpan, or urinal?: Total Help from another person bathing (including washing, rinsing, drying)?: Total Help from another person to put on and taking off regular upper body clothing?: Total Help from another person to put on and taking off regular lower body clothing?: Total 6 Click Score: 6    End of Session Equipment Utilized During Treatment: Oxygen  OT Visit Diagnosis: Unsteadiness on feet (R26.81);Muscle weakness (generalized) (M62.81);Other symptoms and signs involving cognitive function;Hemiplegia and hemiparesis Hemiplegia - Right/Left: Right Hemiplegia - dominant/non-dominant: Dominant Hemiplegia - caused by: Nontraumatic intracerebral hemorrhage   Activity Tolerance Patient limited by fatigue   Patient Left in chair;with call bell/phone within reach;with chair alarm set;with restraints reapplied   Nurse Communication Mobility status;Precautions;Need for lift equipment        Time: 4128-7867 OT Time Calculation (min): 29 min  Charges: OT General Charges $OT Visit: 1 Visit OT Treatments $Self Care/Home Management : 8-22 mins   Brynn, OTR/L  Acute Rehabilitation Services Pager: [phone] Office: [phone] .    Jeri Modena 01/31/2021, 4:25 PM

## 2021-01-31 NOTE — Progress Notes (Signed)
Physical Therapy Treatment Patient Details Name: Brian [name] MRN: [id number] DOB: Sep 14, 1936 Today's Date: 01/31/2021    History of Present Illness Pt is a 85 y.o. M who presents with acute onset R sided weakness, facial droop, numbness, aphasia with left gaze preference. CTH showing L thalamic ICH. Significant PMH: afib, DM2, HLD, HTN, stroke.    PT Comments    Session focused on maxi move transfer out of bed to chair to promote arousal and upright positioning. Pt received with bowel incontinence; performed rolling to right/left for peri care, pad and gown change. Pt demonstrating minimal command following this session and nonverbal. BP 150/69, SpO2 95%, HR 75. May benefit from AM session vs PM for improved alertness.    Follow Up Recommendations  SNF     Equipment Recommendations  Other (comment) (defer to post acute)    Recommendations for Other Services       Precautions / Restrictions Precautions Precautions: Fall;Other (comment) Precaution Comments: R hemiparesis, global aphasia, cortrak Restrictions Weight Bearing Restrictions: No    Mobility  Bed Mobility Overal bed mobility: Needs Assistance Bed Mobility: Rolling Rolling: Total assist;+2 for physical assistance         General bed mobility comments: Total A for rolling to right and left for peri care and placement of maxi move sling    Transfers                    Ambulation/Gait                 Stairs             Wheelchair Mobility    Modified Rankin (Stroke Patients Only) Modified Rankin (Stroke Patients Only) Pre-Morbid Rankin Score: Slight disability Modified Rankin: Severe disability     Balance Overall balance assessment: Needs assistance Sitting-balance support: Feet supported Sitting balance-Leahy Scale: Poor Sitting balance - Comments: Right lateral lean up in chair, use of pillow wedge to maintain neutral alignment Postural control: Right lateral lean                                   Cognition Arousal/Alertness: Lethargic Behavior During Therapy: Flat affect Overall Cognitive Status: Impaired/Different from baseline Area of Impairment: Following commands                       Following Commands: Follows one step commands inconsistently       General Comments: Minimal command following today and nonverbal. Tendency to keep left eye closed, right eye opening minimally with min-mod stimulation and name call      Exercises      General Comments        Pertinent Vitals/Pain Pain Assessment: Faces Faces Pain Scale: No hurt    Home Living                      Prior Function            PT Goals (current goals can now be found in the care plan section) Acute Rehab PT Goals Patient Stated Goal: none stated PT Goal Formulation: Patient unable to participate in goal setting Time For Goal Achievement: 02/07/21 Potential to Achieve Goals: Fair    Frequency    Min 3X/week      PT Plan Current plan remains appropriate    Co-evaluation PT/OT/SLP Co-Evaluation/Treatment: Yes Reason for Co-Treatment: Complexity of the  patient's impairments (multi-system involvement);Necessary to address cognition/behavior during functional activity;For patient/therapist safety;To address functional/ADL transfers PT goals addressed during session: Mobility/safety with mobility        AM-PAC PT "6 Clicks" Mobility   Outcome Measure  Help needed turning from your back to your side while in a flat bed without using bedrails?: Total Help needed moving from lying on your back to sitting on the side of a flat bed without using bedrails?: Total Help needed moving to and from a bed to a chair (including a wheelchair)?: Total Help needed standing up from a chair using your arms (e.g., wheelchair or bedside chair)?: Total Help needed to walk in hospital room?: Total Help needed climbing 3-5 steps with a railing? :  Total 6 Click Score: 6    End of Session Equipment Utilized During Treatment: Gait belt Activity Tolerance: Patient limited by fatigue Patient left: in bed;with call bell/phone within reach;with family/visitor present;with nursing/sitter in room;with bed alarm set Nurse Communication: Mobility status PT Visit Diagnosis: Other abnormalities of gait and mobility (R26.89);Hemiplegia and hemiparesis Hemiplegia - Right/Left: Right Hemiplegia - dominant/non-dominant: Dominant Hemiplegia - caused by: Nontraumatic intracerebral hemorrhage     Time: 6063-0160 PT Time Calculation (min) (ACUTE ONLY): 29 min  Charges:  $Therapeutic Activity: 8-22 mins                     Wyona Almas, PT, DPT Acute Rehabilitation Services Pager [phone] Office [phone]    Deno Etienne 01/31/2021, 2:46 PM

## 2021-01-31 NOTE — Progress Notes (Signed)
NAME:  Brian [name], MRN:  [id number], DOB:  [birth year], LOS: 8 ADMISSION DATE:  01/23/2021, CONSULTATION DATE: 5/13 REFERRING MD:  Dr. Stroke, CHIEF COMPLAINT: ICH  History of Present Illness:   Patient is 85 yo AA male admitted to [facility] on 01/23/2021 for acute onset right sided weakness and language deficit. Pertinent PMH of HTN, HLD, EF 45-50% on 2019, T2DM, Chronic anemia, Afib on Eliquis at home, prior L MCa Stroke. CTH w/o contrast and MRI on 5/9 showing L thalamic ICH with cytoxic edema/with mild 63mm left to right shift. Patient was anticoagulated with Apixaban for A-fib which was reversed in ED with Andexanet Alpha. Also requiring Cleviprex infusion for hypertensive emergency. Admitted to Neuro ICU under the stroke service.   Course complicated with ongoing HTN requiring Cleviprex infusion. Mentation initially somewhat improving. Patient developed fever with increased lethargy on 5/12. Failed swallow eval and core-track was placed. Cleviprex stopped on 5/12 and started on oral anti hypertensives. On 5/13 low grade fever persist with downtrending WBC. EEG shows focal left-sided slowing but no focal seizures. Hypertonic saline started on 5/13. Patient developed HR in the 150-160s and PCCM consulted.   Pertinent  Medical History  Stroke  Obesity HTN HLD Type 2 diabetes  Chronic anemia A-fib with chronic anticoagulation  Significant Hospital Events: Including procedures, antibiotic start and stop dates in addition to other pertinent events   . 5/9- admitted for ICH.  [name] 5/9- CT head w/o: acute left thalamic hemorrhage with mild mass effect. . 5/9 MR brain w/o: 3.0 x 3.1 x 3.4 cm acute intraparenchymal hemorrhage centered at the left thalamus. Surrounding vasogenic edema with mild regional mass effect and 5 mm of left-to-right shift. . 5/11 CT Head w/o: No significant change. Surrounding edema mildly increased.  . 5/13 Increasing lethargy. Repeat CT showed increasing midline shift.  Hypertonic saline initiated.  . 5/14 developed atrial fibrillation with rapid ventricular rate with a rate of 1 50-1 60, IV amiodarone drip started . 5/15 less R sided neglect, moving out of ICU . 5/16- sustained atrial tachycardia, given increased carvedilol and amiodarone  Interim History / Subjective:  Patient is unable to clear oral/oropharyngeal secretions, needed nasal suction yesterday Remained afebrile  Objective   Blood pressure (!) 179/74, pulse 77, temperature (!) 100.7 F (38.2 C), temperature source Axillary, resp. rate 19, height 6' (1.829 m), weight 99.9 kg, SpO2 96 %.        Intake/Output Summary (Last 24 hours) at 01/31/2021 1000 Last data filed at 01/31/2021 0700 Gross per 24 hour  Intake 2117.97 ml  Output 525 ml  Net 1592.97 ml   Filed Weights   01/29/21 0400 01/30/21 0400 01/31/21 0500  Weight: 104.3 kg 103.2 kg 99.9 kg    Examination: General: Critically ill, elderly African-American male lying in the bed HEENT: [place]/AT, eyes anicteric, NGT in place Neuro: Opens eyes with vocal stimuli, does not follow commands CV: S1S2, RRR PULM: inspiratory rhonchi bilaterally, saturating in mid-90s on RA GI: soft, NT, ND Extremities: warm, mild pedal edema worse on R Skin: no rashes or wounds  Labs/imaging that I have personally reviewed   Na+ 153 BUN trends up from 27 to 35 Cr trends up from 0.95 to 1.08 WBC trends up from 8.7 to 9.5 H/H improved from 10.6/ 35.5-> 11.2/ 38.8  Resolved Hospital Problem list   AKI  Assessment & Plan:  ICH on eliquis; s/p andexant alpha - Left thalamic ICH with 7 mm midline shift - Induced hypernatremia (Na trends up  from 149->153) - stopped eliquis  Hypertensive emergency, remains very resistant to escalating meds - Stroke team is following - Appreciate stroke team's management  - Continue to monitor serum sodium as it drifts down - SBP goal less than 160 - Con't coreg and losartan, amlodipine - Con't PRN labetalol and  hydralazine  - Neuroprotective measures - Aspiration precautions - Con't statin  Sustained atrial tachycardia - developed 2 episodes on 5/16 - on carvedilol and amiodarone  History of Atrial Fibrillation - Chronically anticoagulated on Eliquis, this was reversed on arrival  - Holding anticoagulation due to [facility] - Cardiology is following, currently on Coreg 25 mg twice daily - Con't amiodarone infusion for now - Continuous telemetry  Possible aspiration pneumonia - Complete 7 days of unasyn (day #5) - Temp spiked to 100.7 this morning but no leukocytosis - CXR (5/16)- clear lungs bilaterally - Continue tube feeds via cortrak - Aspiration precautions, NTS PRN  Diabetes type 2 Fingersticks are well controlled now Continue levemir 15 units BID Con't TF coverage 5 units Q4h with hold parameters SSI PRN CBG goal 140-180  Dysphagia Continue tube feeds via core track Aspiration precautions SLP following NTS PRN  Acute anemia likely due to critical illness (normocytic, hypochromic) -transfuse for Hb <7 or hemodynamically significant bleeding  -monitor  Best practice   Diet:  Tube Feed  Pain/Anxiety/Delirium protocol (if indicated): No VAP protocol (if indicated): Not indicated DVT prophylaxis: Lovenox GI prophylaxis: PPI Glucose control:  SSI Yes Central venous access:  N/A Arterial line:  N/A Foley:  N/A Mobility: Out of bed to chair PT consulted: Yes Last date of multidisciplinary goals of care discussion Per primary  Code Status:  full code Disposition: ICU

## 2021-01-31 NOTE — Progress Notes (Signed)
STROKE TEAM PROGRESS NOTE   SUBJECTIVE (INTERVAL HISTORY) No family is at the bedside. I later met pt brother in room. Pt neuro unchanged, still has left gaze preference, global aphasia and right hemiplegia. No more SVT episode, will change amiodarone to PO. Na still on the high end, will add free water. Repeat CT in am.    OBJECTIVE Temp:  [98.8 F (37.1 C)-100.7 F (38.2 C)] 100.6 F (38.1 C) (05/17 1200) Pulse Rate:  [56-78] 60 (05/17 1900) Cardiac Rhythm: Normal sinus rhythm (05/17 0400) Resp:  [10-21] 10 (05/17 1900) BP: (125-179)/(50-92) 161/63 (05/17 1900) SpO2:  [91 %-97 %] 93 % (05/17 1900) Weight:  [99.9 kg] 99.9 kg (05/17 0500)  Recent Labs  Lab 01/31/21 0349 01/31/21 0759 01/31/21 1137 01/31/21 1537 01/31/21 1946  GLUCAP 128* 98 198* 132* 198*   Recent Labs  Lab 01/25/21 1645 01/26/21 0744 01/26/21 0907 01/26/21 1708 01/27/21 0445 01/27/21 1737 01/27/21 2016 01/28/21 0439 01/28/21 1309 01/29/21 0640 01/29/21 1325 01/30/21 0748 01/30/21 1246 01/30/21 1844 01/31/21 0125 01/31/21 0645  NA  --   --    < >  --    < > 142   < > 147*   < > 152*   < > 153* 151* 149* 152* 153*  K  --   --    < >  --    < > 3.9  --  4.8  --  3.7  --  3.8  --   --   --  4.0  CL  --   --    < >  --    < > 110  --  116*  --  123*  --  120*  --   --   --  116*  CO2  --   --    < >  --    < > 21*  --  19*  --  22  --  24  --   --   --  26  GLUCOSE  --   --    < >  --    < > 216*  --  163*  --  192*  --  101*  --   --   --  73  BUN  --   --    < >  --    < > 34*  --  30*  --  25*  --  27*  --   --   --  35*  CREATININE  --   --    < >  --    < > 1.11  --  0.98  --  0.90  --  0.95  --   --   --  1.08  CALCIUM  --   --    < >  --    < > 9.1  --  9.1  --  9.4  --  9.7  --   --   --  9.5  MG 1.7 1.8  --  1.8  --  2.0  --   --   --   --   --   --   --   --   --   --   PHOS 3.2 2.3*  --  3.2  --   --   --   --   --   --   --   --   --   --   --   --    < > =  values in this interval not  displayed.   Recent Labs  Lab 01/27/21 1737  AST 34  ALT 21  ALKPHOS 52  BILITOT 1.1  PROT 6.7  ALBUMIN 3.3*   Recent Labs  Lab 01/27/21 0445 01/27/21 1737 01/28/21 0439 01/29/21 0640 01/30/21 0748 01/31/21 0645  WBC 10.6* 8.8 7.8 6.8 8.7 9.5  NEUTROABS 7.9*  --   --   --   --   --   HGB 11.4* 11.7* 10.4* 10.0* 10.6* 11.3*  HCT 37.1* 38.3* 34.8* 33.8* 35.5* 38.8*  MCV 85.1 85.3 87.4 87.3 87.9 87.6  PLT 165 169 159 198 216 171   No results for input(s): CKTOTAL, CKMB, CKMBINDEX, TROPONINI in the last 168 hours. No results for input(s): LABPROT, INR in the last 72 hours. No results for input(s): COLORURINE, LABSPEC, [place], GLUCOSEU, HGBUR, BILIRUBINUR, KETONESUR, PROTEINUR, UROBILINOGEN, NITRITE, LEUKOCYTESUR in the last 72 hours.  Invalid input(s): APPERANCEUR     Component Value Date/Time   CHOL 113 01/28/2021 0439   CHOL 141 10/09/2018 1328   TRIG 41 01/28/2021 0439   HDL 41 01/28/2021 0439   HDL 57 10/09/2018 1328   CHOLHDL 2.8 01/28/2021 0439   VLDL 8 01/28/2021 0439   LDLCALC 64 01/28/2021 0439   LDLCALC 69 10/09/2018 1328   Lab Results  Component Value Date   HGBA1C 6.1 (H) 01/28/2021      Component Value Date/Time   LABOPIA NONE DETECTED 04/01/2018 1148   COCAINSCRNUR NONE DETECTED 04/01/2018 1148   LABBENZ NONE DETECTED 04/01/2018 1148   AMPHETMU NONE DETECTED 04/01/2018 1148   THCU NONE DETECTED 04/01/2018 1148   LABBARB (A) 04/01/2018 1148    Result not available. Reagent lot number recalled by manufacturer.    No results for input(s): ETH in the last 168 hours.  I have personally reviewed the radiological images below and agree with the radiology interpretations.  CT HEAD WO CONTRAST  Result Date: 01/30/2021 CLINICAL DATA:  Intracranial hemorrhage EXAM: CT HEAD WITHOUT CONTRAST TECHNIQUE: Contiguous axial images were obtained from the base of the skull through the vertex without intravenous contrast. COMPARISON:  01/29/2021 FINDINGS: Brain:  Left thalamic hemorrhage unchanged measuring 28 mm in diameter. Intraventricular hemorrhage unchanged. Negative for hydrocephalus Generalized atrophy with moderate white matter hypodensity diffusely unchanged. No acute ischemic infarct. No midline shift. Vascular: Negative for hyperdense vessel Skull: Negative Sinuses/Orbits: Mild mucosal edema paranasal sinuses. NG tube in place. Other: None IMPRESSION: Stable head CT. Left thalamic hemorrhage and intraventricular hemorrhage unchanged. Negative for hydrocephalus. Electronically Signed   By: Franchot Gallo M.D.   On: 01/30/2021 08:30   CT HEAD WO CONTRAST  Result Date: 01/29/2021 CLINICAL DATA:  Cerebral hemorrhage suspected EXAM: CT HEAD WITHOUT CONTRAST TECHNIQUE: Contiguous axial images were obtained from the base of the skull through the vertex without intravenous contrast. COMPARISON:  Two days ago FINDINGS: Brain: Acute hematoma centered in the left thalamus and adjacent white matter, 3.1 cm in maximal span on axial images, unchanged when remeasured in a similar fashion. Intraventricular hemorrhage seen at the lateral ventricles with stable mild ventriculomegaly. Chronic small vessel ischemic type low-density in the hemispheric white matter. No evidence of acute cortical infarct. Small remote right cerebellar infarct. Regional mass effect from the hematoma is unchanged. Vascular: Atheromatous calcification Skull: Negative Sinuses/Orbits: Nasal intubation. IMPRESSION: 1. No progression of the left thalamic or intraventricular hemorrhage. 2. Unchanged ventriculomegaly. Electronically Signed   By: Monte Fantasia M.D.   On: 01/29/2021 10:21   CT Head Wo Contrast  Result Date: 01/27/2021 CLINICAL DATA:  Follow-up hemorrhagic stroke involving the LEFT thalamus with extension into the ventricular system. EXAM: CT HEAD WITHOUT CONTRAST TECHNIQUE: Contiguous axial images were obtained from the base of the skull through the vertex without intravenous contrast.  COMPARISON:  01/25/2021 and earlier. FINDINGS: Brain: Hematoma involving the LEFT thalamus measuring approximately 2.6 x 2.8 x 2.8 cm, unchanged in size since the initial 01/23/2021 CT. Interval decrease in the amount of intraventricular hemorrhage in the lateral ventricles and the third ventricle. Mass-effect with midline shift of approximately 7 mm to the RIGHT, unchanged. No new hemorrhage or hematoma. No extra-axial fluid collections. Vascular: Mild BILATERAL carotid siphon and vertebral artery atherosclerosis. Hyperdense vessel. Skull: No skull fracture or other focal osseous abnormality involving the skull. Sinuses/Orbits: Paranasal sinuses, mastoid air cells and middle ear cavities well-aerated. Benign senile scleral calcifications in both eyes. Other: None. IMPRESSION: 1. Stable LEFT thalamic hematoma and associated midline shift to the RIGHT of approximately 7 mm. 2. Decreasing intraventricular hemorrhage. 3. No new intracranial abnormality. Electronically Signed   By: Evangeline Dakin M.D.   On: 01/27/2021 10:55   CT HEAD WO CONTRAST  Result Date: 01/25/2021 CLINICAL DATA:  Follow-up examination for acute stroke. EXAM: CT HEAD WITHOUT CONTRAST TECHNIQUE: Contiguous axial images were obtained from the base of the skull through the vertex without intravenous contrast. COMPARISON:  Prior CT from 01/23/2021. FINDINGS: Brain: Intraparenchymal hemorrhage centered at the left thalamus is not significantly changed in size or morphology as compared to previous exam. Surrounding edema is stable to mildly increased. Similar regional mass effect with localized left-to-right shift. Intraventricular extension with blood within the lateral and third ventricles, relatively stable. Stable ventricular size and morphology without hydrocephalus or trapping. Basilar cisterns remain patent. No new intracranial hemorrhage or large vessel territory infarct. No extra-axial collection. Underlying atrophy with chronic  microvascular ischemic disease again noted. Small remote right cerebellar infarcts noted as well. Vascular: No hyperdense vessel. Scattered vascular calcifications noted within the carotid siphons. Skull: Scalp soft tissues and calvarium demonstrate no acute finding. Sinuses/Orbits: Globes and orbital soft tissues demonstrate no acute finding. Mild scattered mucosal thickening noted within the ethmoidal air cells and maxillary sinuses. Paranasal sinuses are otherwise clear. Mastoid air cells remain largely clear. Other: None. IMPRESSION: 1. No significant interval change in size and morphology of left thalamic hemorrhage. Surrounding edema has mildly increased from prior, although regional mass effect and localized left-to-right shift is not significantly changed. 2. Intraventricular extension with blood within the lateral and third ventricles, stable. Stable ventricular size and morphology without hydrocephalus or trapping. 3. No other new acute intracranial abnormality. Electronically Signed   By: Jeannine Boga M.D.   On: 01/25/2021 04:19   CT HEAD WO CONTRAST  Result Date: 01/23/2021 CLINICAL DATA:  Follow-up intracranial hemorrhage EXAM: CT HEAD WITHOUT CONTRAST TECHNIQUE: Contiguous axial images were obtained from the base of the skull through the vertex without intravenous contrast. COMPARISON:  CT from earlier in the same day. FINDINGS: Brain: Parenchymal hemorrhage is again noted in the region of the left thalamus. Some surrounding edema is noted. The hematoma measures approximately 3.0 x 2.4 cm and roughly stable from the prior exam. Some intraventricular component is again noted particularly in the left lateral ventricle. Chronic white matter ischemic changes are noted. No new focal hemorrhage is seen. Chronic right cerebellar infarct is noted. Vascular: No hyperdense vessel or unexpected calcification. Skull: Normal. Negative for fracture or focal lesion. Sinuses/Orbits: No acute finding. Other:  None. IMPRESSION: Stable  appearing left thalamic hemorrhage with intraventricular component. No new focal abnormality is noted. Electronically Signed   By: Inez Catalina M.D.   On: 01/23/2021 21:54   MR MRA HEAD WO CONTRAST  Result Date: 01/23/2021 CLINICAL DATA:  Initial evaluation for acute intracranial hemorrhage, headache. EXAM: MRI HEAD WITHOUT CONTRAST MRA HEAD WITHOUT CONTRAST TECHNIQUE: Multiplanar, multi-echo pulse sequences of the brain and surrounding structures were acquired without intravenous contrast. Angiographic images of the Circle of Willis were acquired using MRA technique without intravenous contrast. COMPARISON: No pertinent prior exam. COMPARISON:  Prior CT from earlier the same day as well as previous MRI from 06/23/2018. FINDINGS: MRI HEAD FINDINGS Brain: Examination moderately to severely degraded by motion artifact. Generalized age-related cerebral atrophy. Patchy and confluent T2/FLAIR hyperintensity within the periventricular and deep white matter both cerebral hemispheres most consistent with chronic small vessel ischemic disease. Few small remote right cerebellar infarcts noted. Previously identified acute intraparenchymal hemorrhage centered at the left thalamus again seen. Bleed measures 3.0 x 3.1 x 3.4 cm on this motion degraded exam (estimated volume 16 cc). No visible underlying mass lesion or other structure abnormality on this motion degraded noncontrast examination. Surrounding vasogenic edema with mild regional mass effect. Associated mild localized left-to-right shift measures up to 5 mm at the septum pellucidum. Associated intraventricular extension with blood within the left greater than right lateral ventricles. No hydrocephalus or ventricular trapping at this time. No other evidence for acute intracranial hemorrhage. No other foci of susceptibility artifact to suggest chronic hemorrhage elsewhere within the brain. No other foci of restricted diffusion to suggest acute  or subacute ischemia. Gray-white matter differentiation otherwise maintained. No mass lesion or mass effect elsewhere within the brain. No extra-axial fluid collection. Pituitary gland suprasellar region within normal limits. Vascular: Major intracranial vascular flow voids are maintained. Skull and upper cervical spine: Craniocervical junction within normal limits. Bone marrow signal intensity normal. No scalp soft tissue abnormality. Sinuses/Orbits: Patient status post ocular lens replacement on the right. Globes and orbital soft tissues demonstrate no acute finding. Paranasal sinuses are largely clear. Trace bilateral mastoid effusions, of doubtful significance. Inner ear structures grossly normal. Other: None. MRA HEAD FINDINGS ANTERIOR CIRCULATION: Examination degraded by motion artifact. Both internal carotid arteries patent to the termini without stenosis or other appreciable abnormality. A1 segments patent. Anterior communicating artery complex grossly within normal limits. Anterior cerebral arteries grossly patent to their distal aspects without appreciable stenosis. No obvious M1 stenosis. Grossly normal MCA bifurcations. Distal MCA branches well perfused and symmetric. POSTERIOR CIRCULATION: Both V4 segments patent to the vertebrobasilar junction without stenosis. Left vertebral artery dominant. Left PICA origin patent and normal. Right PICA origin not definitely seen. Basilar patent to its distal aspect without stenosis. Superior cerebellar arteries patent bilaterally. Both PCAs primarily supplied via the basilar. PCAs perfused to their distal aspects without appreciable stenosis. No intracranial aneurysm. No vascular abnormality seen underlying the acute left thalamic hemorrhage. IMPRESSION: MRI HEAD IMPRESSION: 1. Motion degraded exam. 2. 3.0 x 3.1 x 3.4 cm acute intraparenchymal hemorrhage centered at the left thalamus. Surrounding vasogenic edema with mild regional mass effect and 5 mm of  left-to-right shift. No visible underlying mass lesion or other structure abnormality on this motion degraded exam. 3. Associated intraventricular extension with blood within the left greater than right lateral ventricles. No hydrocephalus or ventricular trapping at this time. 4. Underlying age-related cerebral atrophy with mild chronic small vessel ischemic disease, with a few small remote right cerebellar infarcts. MRA HEAD IMPRESSION: 1. Technically  limited exam due to extensive motion artifact. 2. Grossly negative intracranial MRA. No large vessel occlusion, hemodynamically significant stenosis, or other acute vascular abnormality. No vascular abnormality seen underlying the acute left thalamic hemorrhage. Please note that while a MRA neck was also ordered for this exam, this was unable to be performed due to the patient's inability to tolerate the full length of the study and extensive motion artifact. Electronically Signed   By: Jeannine Boga M.D.   On: 01/23/2021 21:41   MR ANGIO NECK W WO CONTRAST  Result Date: 01/23/2021 CLINICAL DATA:  Initial evaluation for acute intracranial hemorrhage, headache. EXAM: MRI HEAD WITHOUT CONTRAST MRA HEAD WITHOUT CONTRAST TECHNIQUE: Multiplanar, multi-echo pulse sequences of the brain and surrounding structures were acquired without intravenous contrast. Angiographic images of the Circle of Willis were acquired using MRA technique without intravenous contrast. COMPARISON: No pertinent prior exam. COMPARISON:  Prior CT from earlier the same day as well as previous MRI from 06/23/2018. FINDINGS: MRI HEAD FINDINGS Brain: Examination moderately to severely degraded by motion artifact. Generalized age-related cerebral atrophy. Patchy and confluent T2/FLAIR hyperintensity within the periventricular and deep white matter both cerebral hemispheres most consistent with chronic small vessel ischemic disease. Few small remote right cerebellar infarcts noted. Previously  identified acute intraparenchymal hemorrhage centered at the left thalamus again seen. Bleed measures 3.0 x 3.1 x 3.4 cm on this motion degraded exam (estimated volume 16 cc). No visible underlying mass lesion or other structure abnormality on this motion degraded noncontrast examination. Surrounding vasogenic edema with mild regional mass effect. Associated mild localized left-to-right shift measures up to 5 mm at the septum pellucidum. Associated intraventricular extension with blood within the left greater than right lateral ventricles. No hydrocephalus or ventricular trapping at this time. No other evidence for acute intracranial hemorrhage. No other foci of susceptibility artifact to suggest chronic hemorrhage elsewhere within the brain. No other foci of restricted diffusion to suggest acute or subacute ischemia. Gray-white matter differentiation otherwise maintained. No mass lesion or mass effect elsewhere within the brain. No extra-axial fluid collection. Pituitary gland suprasellar region within normal limits. Vascular: Major intracranial vascular flow voids are maintained. Skull and upper cervical spine: Craniocervical junction within normal limits. Bone marrow signal intensity normal. No scalp soft tissue abnormality. Sinuses/Orbits: Patient status post ocular lens replacement on the right. Globes and orbital soft tissues demonstrate no acute finding. Paranasal sinuses are largely clear. Trace bilateral mastoid effusions, of doubtful significance. Inner ear structures grossly normal. Other: None. MRA HEAD FINDINGS ANTERIOR CIRCULATION: Examination degraded by motion artifact. Both internal carotid arteries patent to the termini without stenosis or other appreciable abnormality. A1 segments patent. Anterior communicating artery complex grossly within normal limits. Anterior cerebral arteries grossly patent to their distal aspects without appreciable stenosis. No obvious M1 stenosis. Grossly normal MCA  bifurcations. Distal MCA branches well perfused and symmetric. POSTERIOR CIRCULATION: Both V4 segments patent to the vertebrobasilar junction without stenosis. Left vertebral artery dominant. Left PICA origin patent and normal. Right PICA origin not definitely seen. Basilar patent to its distal aspect without stenosis. Superior cerebellar arteries patent bilaterally. Both PCAs primarily supplied via the basilar. PCAs perfused to their distal aspects without appreciable stenosis. No intracranial aneurysm. No vascular abnormality seen underlying the acute left thalamic hemorrhage. IMPRESSION: MRI HEAD IMPRESSION: 1. Motion degraded exam. 2. 3.0 x 3.1 x 3.4 cm acute intraparenchymal hemorrhage centered at the left thalamus. Surrounding vasogenic edema with mild regional mass effect and 5 mm of left-to-right shift. No visible underlying  mass lesion or other structure abnormality on this motion degraded exam. 3. Associated intraventricular extension with blood within the left greater than right lateral ventricles. No hydrocephalus or ventricular trapping at this time. 4. Underlying age-related cerebral atrophy with mild chronic small vessel ischemic disease, with a few small remote right cerebellar infarcts. MRA HEAD IMPRESSION: 1. Technically limited exam due to extensive motion artifact. 2. Grossly negative intracranial MRA. No large vessel occlusion, hemodynamically significant stenosis, or other acute vascular abnormality. No vascular abnormality seen underlying the acute left thalamic hemorrhage. Please note that while a MRA neck was also ordered for this exam, this was unable to be performed due to the patient's inability to tolerate the full length of the study and extensive motion artifact. Electronically Signed   By: Jeannine Boga M.D.   On: 01/23/2021 21:41   MR BRAIN WO CONTRAST  Result Date: 01/23/2021 CLINICAL DATA:  Initial evaluation for acute intracranial hemorrhage, headache. EXAM: MRI HEAD  WITHOUT CONTRAST MRA HEAD WITHOUT CONTRAST TECHNIQUE: Multiplanar, multi-echo pulse sequences of the brain and surrounding structures were acquired without intravenous contrast. Angiographic images of the Circle of Willis were acquired using MRA technique without intravenous contrast. COMPARISON: No pertinent prior exam. COMPARISON:  Prior CT from earlier the same day as well as previous MRI from 06/23/2018. FINDINGS: MRI HEAD FINDINGS Brain: Examination moderately to severely degraded by motion artifact. Generalized age-related cerebral atrophy. Patchy and confluent T2/FLAIR hyperintensity within the periventricular and deep white matter both cerebral hemispheres most consistent with chronic small vessel ischemic disease. Few small remote right cerebellar infarcts noted. Previously identified acute intraparenchymal hemorrhage centered at the left thalamus again seen. Bleed measures 3.0 x 3.1 x 3.4 cm on this motion degraded exam (estimated volume 16 cc). No visible underlying mass lesion or other structure abnormality on this motion degraded noncontrast examination. Surrounding vasogenic edema with mild regional mass effect. Associated mild localized left-to-right shift measures up to 5 mm at the septum pellucidum. Associated intraventricular extension with blood within the left greater than right lateral ventricles. No hydrocephalus or ventricular trapping at this time. No other evidence for acute intracranial hemorrhage. No other foci of susceptibility artifact to suggest chronic hemorrhage elsewhere within the brain. No other foci of restricted diffusion to suggest acute or subacute ischemia. Gray-white matter differentiation otherwise maintained. No mass lesion or mass effect elsewhere within the brain. No extra-axial fluid collection. Pituitary gland suprasellar region within normal limits. Vascular: Major intracranial vascular flow voids are maintained. Skull and upper cervical spine: Craniocervical junction  within normal limits. Bone marrow signal intensity normal. No scalp soft tissue abnormality. Sinuses/Orbits: Patient status post ocular lens replacement on the right. Globes and orbital soft tissues demonstrate no acute finding. Paranasal sinuses are largely clear. Trace bilateral mastoid effusions, of doubtful significance. Inner ear structures grossly normal. Other: None. MRA HEAD FINDINGS ANTERIOR CIRCULATION: Examination degraded by motion artifact. Both internal carotid arteries patent to the termini without stenosis or other appreciable abnormality. A1 segments patent. Anterior communicating artery complex grossly within normal limits. Anterior cerebral arteries grossly patent to their distal aspects without appreciable stenosis. No obvious M1 stenosis. Grossly normal MCA bifurcations. Distal MCA branches well perfused and symmetric. POSTERIOR CIRCULATION: Both V4 segments patent to the vertebrobasilar junction without stenosis. Left vertebral artery dominant. Left PICA origin patent and normal. Right PICA origin not definitely seen. Basilar patent to its distal aspect without stenosis. Superior cerebellar arteries patent bilaterally. Both PCAs primarily supplied via the basilar. PCAs perfused to their distal aspects  without appreciable stenosis. No intracranial aneurysm. No vascular abnormality seen underlying the acute left thalamic hemorrhage. IMPRESSION: MRI HEAD IMPRESSION: 1. Motion degraded exam. 2. 3.0 x 3.1 x 3.4 cm acute intraparenchymal hemorrhage centered at the left thalamus. Surrounding vasogenic edema with mild regional mass effect and 5 mm of left-to-right shift. No visible underlying mass lesion or other structure abnormality on this motion degraded exam. 3. Associated intraventricular extension with blood within the left greater than right lateral ventricles. No hydrocephalus or ventricular trapping at this time. 4. Underlying age-related cerebral atrophy with mild chronic small vessel  ischemic disease, with a few small remote right cerebellar infarcts. MRA HEAD IMPRESSION: 1. Technically limited exam due to extensive motion artifact. 2. Grossly negative intracranial MRA. No large vessel occlusion, hemodynamically significant stenosis, or other acute vascular abnormality. No vascular abnormality seen underlying the acute left thalamic hemorrhage. Please note that while a MRA neck was also ordered for this exam, this was unable to be performed due to the patient's inability to tolerate the full length of the study and extensive motion artifact. Electronically Signed   By: Jeannine Boga M.D.   On: 01/23/2021 21:41   DG CHEST PORT 1 VIEW  Result Date: 01/30/2021 CLINICAL DATA:  Leukocytosis. EXAM: PORTABLE CHEST 1 VIEW COMPARISON:  Jan 27, 2021. FINDINGS: The heart size and mediastinal contours are within normal limits. Distal tip of feeding tube is seen in proximal stomach. Both lungs are clear. The visualized skeletal structures are unremarkable. IMPRESSION: No active disease. Electronically Signed   By: Marijo Conception M.D.   On: 01/30/2021 11:38   DG Chest Port 1 View  Result Date: 01/27/2021 CLINICAL DATA:  Code stroke EXAM: PORTABLE CHEST 1 VIEW COMPARISON:  01/26/2021 FINDINGS: Esophageal tube tip coiled within the proximal stomach. No focal opacity or pleural effusion. Stable cardiomediastinal silhouette with borderline cardiomegaly. No pneumothorax. IMPRESSION: No active disease. Electronically Signed   By: Donavan Foil M.D.   On: 01/27/2021 18:38   DG Chest Port 1 View  Result Date: 01/26/2021 CLINICAL DATA:  Fever, code stroke EXAM: PORTABLE CHEST 1 VIEW COMPARISON:  Radiograph 08/13/2011 FINDINGS: Unchanged cardiomediastinal silhouette. There is no focal airspace disease. There is no pleural effusion or visible pneumothorax. There is no acute osseous abnormality. There is a at weighted tip enteric tube overlying the stomach. IMPRESSION: No evidence of acute  cardiopulmonary disease. Electronically Signed   By: Maurine Simmering   On: 01/26/2021 10:06   EEG adult  Result Date: 01/27/2021 Lora Havens, MD     01/27/2021 11:40 AM Patient Name: VINCENT STREATER MRN: [id number] Epilepsy Attending: Lora Havens Referring Physician/Provider: Dr. Antony Contras Date: 01/27/2021 Duration: 23.10 minutes Patient history: 84 year old male with left thalamic hemorrhage.  EEG to evaluate for seizures. Level of alertness: Awake AEDs during EEG study: Gabapentin Technical aspects: This EEG study was done with scalp electrodes positioned according to the 10-20 International system of electrode placement. Electrical activity was acquired at a sampling rate of 500Hz  and reviewed with a high frequency filter of 70Hz  and a low frequency filter of 1Hz . EEG data were recorded continuously and digitally stored. Description: The posterior dominant rhythm consists of 8 Hz activity of moderate voltage (25-35 uV) seen predominantly in posterior head regions, symmetric and reactive to eye opening and eye closing.  EEG showed continuous rhythmic sharply contoured 3 to 5 Hz theta- delta slowing in left frontotemporal region hyperventilation and photic stimulation were not performed.   ABNORMALITY -Continuous slow, left  frontotemporal region IMPRESSION: This study is suggestive of cortical dysfunction in left frontotemporal region likely secondary to underlying bleed. No seizures or definite epileptiform discharges were seen throughout the recording. Lora Havens   ECHOCARDIOGRAM COMPLETE  Result Date: 01/24/2021    ECHOCARDIOGRAM REPORT   Patient Name:   SHELBY ANDERLE Date of Exam: 01/24/2021 Medical Rec #:  [id number]     Height:       72.0 in Accession #:    [id number]    Weight:       215.6 lb Date of Birth:  06/15/1936     BSA:          2.200 m Patient Age:    33 years      BP:           143/92 mmHg Patient Gender: M             HR:           77 bpm. Exam Location:  Inpatient Procedure: 2D  Echo, Cardiac Doppler and Color Doppler Indications:    Stroke I63.9  History:        Patient has prior history of Echocardiogram examinations, most                 recent 04/02/2018. Stroke, Signs/Symptoms:Chest Pain; Risk                 Factors:Hypertension, Diabetes and Dyslipidemia.  Sonographer:    Tiffany Dance Referring Phys: [id number] [place]  Sonographer Comments: Suboptimal subcostal window. IMPRESSIONS  1. Left ventricular ejection fraction, by estimation, is 55 to 60%. The left ventricle has normal function. The left ventricle has no regional wall motion abnormalities. Left ventricular diastolic parameters are consistent with Grade I diastolic dysfunction (impaired relaxation).  2. Right ventricular systolic function is normal. The right ventricular size is normal.  3. Left atrial size was mildly dilated.  4. The mitral valve is normal in structure. No evidence of mitral valve regurgitation. No evidence of mitral stenosis.  5. The aortic valve is normal in structure. Aortic valve regurgitation is not visualized. Mild aortic valve sclerosis is present, with no evidence of aortic valve stenosis.  6. The inferior vena cava is normal in size with greater than 50% respiratory variability, suggesting right atrial pressure of 3 mmHg. Comparison(s): Prior images unable to be directly viewed, comparison made by report only. Conclusion(s)/Recommendation(s): No intracardiac source of embolism detected on this transthoracic study. Consider saline contrast study or TEE to evaluate the atrial septal aneurysm for evidence of PFO/shunting. FINDINGS  Left Ventricle: Left ventricular ejection fraction, by estimation, is 55 to 60%. The left ventricle has normal function. The left ventricle has no regional wall motion abnormalities. The left ventricular internal cavity size was normal in size. There is  borderline concentric left ventricular hypertrophy. Left ventricular diastolic parameters are consistent with  Grade I diastolic dysfunction (impaired relaxation). Indeterminate filling pressures. Right Ventricle: The right ventricular size is normal. No increase in right ventricular wall thickness. Right ventricular systolic function is normal. Left Atrium: Left atrial size was mildly dilated. Right Atrium: Right atrial size was normal in size. Pericardium: There is no evidence of pericardial effusion. Mitral Valve: The mitral valve is normal in structure. Mild mitral annular calcification. No evidence of mitral valve regurgitation. No evidence of mitral valve stenosis. Tricuspid Valve: The tricuspid valve is normal in structure. Tricuspid valve regurgitation is not demonstrated. No evidence of tricuspid stenosis. Aortic Valve: The aortic valve is  normal in structure. Aortic valve regurgitation is not visualized. Mild aortic valve sclerosis is present, with no evidence of aortic valve stenosis. Pulmonic Valve: The pulmonic valve was normal in structure. Pulmonic valve regurgitation is not visualized. No evidence of pulmonic stenosis. Aorta: The aortic root is normal in size and structure. Venous: The inferior vena cava is normal in size with greater than 50% respiratory variability, suggesting right atrial pressure of 3 mmHg. IAS/Shunts: The interatrial septum is aneurysmal. No atrial level shunt detected by color flow Doppler.  LEFT VENTRICLE PLAX 2D LVIDd:         4.00 cm     Diastology LVIDs:         3.00 cm     LV e' medial:    3.70 cm/s LV PW:         1.30 cm     LV E/e' medial:  14.9 LV IVS:        1.20 cm     LV e' lateral:   6.64 cm/s LVOT diam:     2.40 cm     LV E/e' lateral: 8.3 LV SV:         80 LV SV Index:   36 LVOT Area:     4.52 cm  LV Volumes (MOD) LV vol d, MOD A2C: 73.3 ml LV vol d, MOD A4C: 82.0 ml LV vol s, MOD A2C: 28.7 ml LV vol s, MOD A4C: 35.1 ml LV SV MOD A2C:     44.6 ml LV SV MOD A4C:     82.0 ml LV SV MOD BP:      46.3 ml RIGHT VENTRICLE             IVC RV Basal diam:  1.80 cm     IVC diam:  1.60 cm RV S prime:     13.50 cm/s TAPSE (M-mode): 2.3 cm LEFT ATRIUM             Index       RIGHT ATRIUM           Index LA diam:        4.00 cm 1.82 cm/m  RA Area:     12.10 cm LA Vol (A2C):   74.9 ml 34.05 ml/m RA Volume:   22.20 ml  10.09 ml/m LA Vol (A4C):   32.2 ml 14.64 ml/m LA Biplane Vol: 50.7 ml 23.05 ml/m  AORTIC VALVE LVOT Vmax:   94.20 cm/s LVOT Vmean:  64.400 cm/s LVOT VTI:    0.176 m  AORTA Ao Root diam: 3.80 cm Ao Asc diam:  3.80 cm MITRAL VALVE MV Area (PHT): 3.12 cm    SHUNTS MV Decel Time: 243 msec    Systemic VTI:  0.18 m MV E velocity: 55.30 cm/s  Systemic Diam: 2.40 cm MV A velocity: 97.30 cm/s MV E/A ratio:  0.57 Mihai Croitoru MD Electronically signed by Sanda Klein MD Signature Date/Time: 01/24/2021/4:17:34 PM    Final    CT HEAD CODE STROKE WO CONTRAST  Result Date: 01/23/2021 CLINICAL DATA:  Code stroke.  Left-sided facial droop EXAM: CT HEAD WITHOUT CONTRAST TECHNIQUE: Contiguous axial images were obtained from the base of the skull through the vertex without intravenous contrast. COMPARISON:  2019 FINDINGS: Brain: There is acute hemorrhage centered within the left thalamus measuring 2.6 x 2.8 x 3 cm. Surrounding edema is present. There is partial effacement of the third ventricle. Intraventricular extension is present with hemorrhage seen within the left lateral and third ventricles.  Gray-white differentiation is preserved. Patchy and confluent areas of hypoattenuation in the supratentorial white matter are nonspecific but probably reflect moderate chronic microvascular ischemic changes. Small chronic right cerebellar infarct. Prominence of the ventricles and sulci reflects generalized parenchymal volume loss. Vascular: There is intracranial atherosclerotic calcification at the skull base. Skull: Unremarkable. Sinuses/Orbits: Paranasal sinus mucosal thickening. No acute orbital abnormality. Other: Mastoid air cells are clear. IMPRESSION: Acute left thalamic hemorrhage with  mild regional mass effect and intraventricular extension. No hydrocephalus. Chronic microvascular ischemic changes. Small chronic right cerebellar infarct. Initial results were communicated to Dr. Lorrin Goodell at 3:42 pm on 01/23/2021 by text page via the [facility] messaging system. Electronically Signed   By: Macy Mis M.D.   On: 01/23/2021 15:45   VAS US CAROTID  Result Date: 01/30/2021 Carotid Arterial Duplex Study Patient Name:  AIZIK REH  Date of Exam:   01/30/2021 Medical Rec #: [id number]      Accession #:    [id number] Date of Birth: 1936-05-11      Patient Gender: M Patient Age:   85Y Exam Location:  [facility] Procedure:      VAS US CAROTID Referring Phys: [id number] Rosalin Hawking --------------------------------------------------------------------------------  Indications:       CVA. Risk Factors:      Hypertension, hyperlipidemia, Diabetes, prior CVA. Comparison Study:  no prior Performing Technologist: Abram Sander RVS  Examination Guidelines: A complete evaluation includes B-mode imaging, spectral Doppler, color Doppler, and power Doppler as needed of all accessible portions of each vessel. Bilateral testing is considered an integral part of a complete examination. Limited examinations for reoccurring indications may be performed as noted.  Right Carotid Findings: +----------+--------+--------+--------+------------------+--------+           PSV cm/sEDV cm/sStenosisPlaque DescriptionComments +----------+--------+--------+--------+------------------+--------+ CCA Prox  92      10              heterogenous               +----------+--------+--------+--------+------------------+--------+ CCA Distal80      16              heterogenous               +----------+--------+--------+--------+------------------+--------+ ICA Prox  59      15      1-39%   heterogenous               +----------+--------+--------+--------+------------------+--------+ ICA Distal86      19                                          +----------+--------+--------+--------+------------------+--------+ ECA       99                                                 +----------+--------+--------+--------+------------------+--------+ +----------+--------+-------+--------+-------------------+           PSV cm/sEDV cmsDescribeArm Pressure (mmHG) +----------+--------+-------+--------+-------------------+ Subclavian109                                        +----------+--------+-------+--------+-------------------+ +---------+--------+--+--------+--+---------+ VertebralPSV cm/s58EDV cm/s12Antegrade +---------+--------+--+--------+--+---------+  Left Carotid Findings: +----------+--------+--------+--------+------------------+--------+           PSV cm/sEDV  cm/sStenosisPlaque DescriptionComments +----------+--------+--------+--------+------------------+--------+ CCA Prox  98      11              heterogenous               +----------+--------+--------+--------+------------------+--------+ CCA Distal64      12              heterogenous               +----------+--------+--------+--------+------------------+--------+ ICA Prox  55      13      1-39%   heterogenous               +----------+--------+--------+--------+------------------+--------+ ICA Distal62      13                                         +----------+--------+--------+--------+------------------+--------+ ECA       81                                                 +----------+--------+--------+--------+------------------+--------+ +----------+--------+--------+--------+-------------------+           PSV cm/sEDV cm/sDescribeArm Pressure (mmHG) +----------+--------+--------+--------+-------------------+ [id number]                                          +----------+--------+--------+--------+-------------------+ +---------+--------+--+--------+--+---------+ VertebralPSV  cm/s53EDV cm/s10Antegrade +---------+--------+--+--------+--+---------+   Summary: Right Carotid: Velocities in the right ICA are consistent with a 1-39% stenosis. Left Carotid: Velocities in the left ICA are consistent with a 1-39% stenosis. Vertebrals: Bilateral vertebral arteries demonstrate antegrade flow. *See table(s) above for measurements and observations.     Preliminary     PHYSICAL EXAM  Temp:  [98.8 F (37.1 C)-100.7 F (38.2 C)] 100.6 F (38.1 C) (05/17 1200) Pulse Rate:  [56-78] 60 (05/17 1900) Resp:  [10-21] 10 (05/17 1900) BP: (125-179)/(50-92) 161/63 (05/17 1900) SpO2:  [91 %-97 %] 93 % (05/17 1900) Weight:  [99.9 kg] 99.9 kg (05/17 0500)  General - Well nourished, well developed, in no apparent distress.  Ophthalmologic - fundi not visualized due to noncooperation.  Cardiovascular - Regular rhythm and rate, not in afib now.  Neuro - awake, alert, eyes open, global aphasia except able to close eyes and lift left leg on command but not other commands. Not able to name and repeat. Left gaze preference but able to cross midline with right gaze, tracking bilaterally, still not blinking to visual threat on the right, PERRL. Right facial droop. Tongue protrusion not cooperative. LUE able to hold against gravity. LLE able to briefly lift up against gravity with spontaneous movement. RUE and RLE 0/5, RUE increased muscle tone. Sensation, coordination and gait not tested.    ASSESSMENT/PLAN Mateus K Flowersis a 85 y.o.malewith history of A. fib on Eliquis, hypertension, hyperlipidemia, diabetes admitted for L Thalamic ICH withIVH and an ICH score of 2. He is s/p Andexanet alpha for presumed ICH on Eliquis.  ICH -left thalamic ICH and IVH with associated cerebral edema in the setting hypertensive emergency and apixaban therapy for PAF  01/23/2021 head CT acute left thalamic ICH and intraventricular extension, no hydrocephalus  Repeat CT Head 5/11 and 5/13 - stable IPH,  edema and  mass effect 35m MLS.   MRI head: 3.0 x 3.1 x 3.4 cm acute intraparenchymal hemorrhage centered at the left thalamus. Mild regional mass effect and 5 mm of left-to-right shift.  MRA head: Grossly negative, No LVO  CT repeat 5/15 increased size of 3rd ventricle and frontal and temproal horn  CT repeat 5/16 decreased size of 3rd ventricle and frontal and temporal horn   Repeat CT in am  CUS unremarkable  2D Echo: EF 55-60%  VTE prophylaxis -Lovenox  On Eliquis for known PAF prior to admission, no anticoagulant or antiplatelets for now given ICH.   Therapy recommendations:  SNF  Disposition:  TBD  Cerebral edema  CT repeat showed stable ICH and IVH with 759mMLS  on 3% saline -> tapered off  Na 138-142-147->153  Allow Na gradually trending down  CT repeat 5/15 increased size of 3rd ventricle and frontal and temproal horn  CT repeat 5/16 decreased size of 3rd ventricle and frontal and temporal horn  CT repeat in am  PAF SVT x 3  Home medication: Eliquis, reversed with Andexxa  Followed by Dr. RoHarrington Challenger2019 Echo showed EF 45-50%  now with improved EF 55-60%  On amiodarone IV -> will change to po [id number]  On coreg 12.5 bid -> 25 bid  No antithrombotic now  Cardiology on board, appreciate help  Hypertension  Home meds: Norvasc 10, Coreg 12.5, Cozaar 100m73mOff Cleviprex  On Coreg 12.5 -> 25  amlodipine 10 and losartan 50 bid  BP goal < 160  Long term goal normotensive  Hyperlipidemia  Home meds:  Lipitor 80mg28mL 55, goal < 70  Hold statin in setting of current ICH    Consider to resume statin at discharge  DM2, controlled  Home meds are glargine 14 u hs, metformin 1000 BID, Januvia 100 daily  HgbA1c 5.8, goal < 7.0  CBGs  SSI  Dysphagia Feeding  Failed swallow eval 5/11, NPO now  Cortrack tube placed  Jevity @ 60cc/hr, add free water  Speech to follow  Need PEG if going to SNF  AKI, resolved  Cre  1.22->1.39->1.11->0.98->0.90->0.95->1.08  Setting of severe hypertension  Avoid nephrotoxins  On tube feeding and IV fluid, add FW  Fever Leukocytosis, improved Copious secretion  Tmax 101.7-> afebrile  Monitor fever curve  CXR: no acute findings  CBC with leukocytosis wbc 13-> 8.8->6.8->8.7->9.5  Urine Culture negative  Chest xray neg  On unasyn    Other Stroke Risk Factors  Advanced Age >/= 65   7rmer Cigarette smoker quit 1995.   Current ETOH use, advised to drink no more than 2 drink(s) a day  Hx stroke/TIA  Other Active Problems  Constipation - on senokot, will give dulcolax suppository   Hospital day # 8  This patient is critically ill due to ICH [place] on Eliquis, chronic A. fib, SVT, cerebral edema, leukocytosis, AKI and at significant risk of neurological worsening, death form heart failure, hematoma expansion, brain herniation, sepsis, seizure, obstructive hydrocephalus. This patient's care requires constant monitoring of vital signs, hemodynamics, respiratory and cardiac monitoring, review of multiple databases, neurological assessment, discussion with family, other specialists and medical decision making of high complexity. I spent 35 minutes of neurocritical care time in the care of this patient. I had long discussion with Brother at bedside, updated pt current condition, treatment plan and potential prognosis, and answered all the questions.  He expressed understanding and appreciation.    JindoRosalin HawkingPhD Stroke  Neurology 01/31/2021 7:54 PM    To contact Stroke Continuity provider, please refer to [email or web address] After hours, contact General Neurology

## 2021-01-31 NOTE — Progress Notes (Signed)
Nutrition Follow-up  DOCUMENTATION CODES:   Not applicable  INTERVENTION:   Tube feeding via Cortrak tube: Jevity 1.5 at 60 ml/h (1440 ml per day) Prosource TF 45 ml TID  Provides 2280 kcal, 123 gm protein, 1094 ml free water daily   NUTRITION DIAGNOSIS:   Inadequate oral intake related to inability to eat as evidenced by NPO status. Ongoing.   GOAL:   Patient will meet greater than or equal to 90% of their needs Met.   MONITOR:   TF tolerance,Diet advancement  REASON FOR ASSESSMENT:   Consult Enteral/tube feeding initiation and management  ASSESSMENT:   Pt with PMH of Afib, DM, HLD, HTN, obesity, prior L MCA now admitted with ICH and associated cerebral edema in setting of hypertensive emergency and Apixaban therapy for Afib.   Pt discussed during ICU rounds and with RN.  SLP following, per notes possible instrumental study later this week if ready. Pt not following commands and continues to have R sided neglect.   5/11 cortak placed, tip gastric 5/13 worsening lethargy started on 3% 5/14 developed Afib, followed by cardiology    Medications reviewed and include: SSI, novolog, levemir, protonix, senokot-s Labs reviewed: Na 153 CBG's: 98-130    Diet Order:   Diet Order            Diet NPO time specified  Diet effective now                 EDUCATION NEEDS:   No education needs have been identified at this time  Skin:  Skin Assessment: Reviewed RN Assessment  Last BM:  5/17  Height:   Ht Readings from Last 1 Encounters:  01/23/21 6' (1.829 m)    Weight:   Wt Readings from Last 1 Encounters:  01/31/21 99.9 kg    Ideal Body Weight:  80.9 kg  BMI:  Body mass index is 29.87 kg/m.  Estimated Nutritional Needs:   Kcal:  2200-2400  Protein:  115-130 grams  Fluid:  > 2 L/day  Lockie Pares., RD, LDN, CNSC See AMiON for contact information

## 2021-01-31 NOTE — Progress Notes (Signed)
Progress Note  Patient Name: Brian [name] Date of Encounter: 01/31/2021  [facility] Cardiologist: Dorris Carnes, MD   Subjective   Opens eyes to voice, does not speak or follow commands. No further SVT since 16 minute episode around 1530h on 5/16, resolved spontaneously. BP mostly 140-160/60-70s. Getting tube feeds.  Inpatient Medications    Scheduled Meds: .  stroke: mapping our early stages of recovery book   Does not apply Once  . amiodarone  150 mg Intravenous Once  . amLODipine  10 mg Per Tube Daily  . carvedilol  25 mg Per Tube BID WC  . chlorhexidine  15 mL Mouth Rinse BID  . Chlorhexidine Gluconate Cloth  6 each Topical Daily  . enoxaparin (LOVENOX) injection  40 mg Subcutaneous Q24H  . feeding supplement (PROSource TF)  45 mL Per Tube TID  . gabapentin  100 mg Per Tube TID  . insulin aspart  3-9 Units Subcutaneous Q4H  . insulin aspart  5 Units Subcutaneous Q4H  . insulin detemir  15 Units Subcutaneous BID  . losartan  50 mg Per Tube BID  . mouth rinse  15 mL Mouth Rinse q12n4p  . pantoprazole sodium  40 mg Per Tube QHS  . senna-docusate  1 tablet Per Tube BID  . sodium chloride flush  3 mL Intravenous Once   Continuous Infusions: . sodium chloride 10 mL/hr at 01/31/21 0700  . amiodarone 30 mg/hr (01/31/21 0700)  . ampicillin-sulbactam (UNASYN) IV 3 g (01/31/21 0741)  . feeding supplement (JEVITY 1.5 CAL/FIBER) 1,000 mL (01/31/21 0525)   PRN Meds: sodium chloride, acetaminophen **OR** acetaminophen (TYLENOL) oral liquid 160 mg/5 mL **OR** acetaminophen, labetalol, metoprolol tartrate   Vital Signs    Vitals:   01/31/21 0600 01/31/21 0700 01/31/21 0741 01/31/21 0800  BP: (!) 159/71 (!) 154/67 (!) 179/74   Pulse: 69 77    Resp: 18 19    Temp:    (!) 100.7 F (38.2 C)  TempSrc:    Axillary  SpO2: 96% 96%    Weight:      Height:        Intake/Output Summary (Last 24 hours) at 01/31/2021 1106 Last data filed at 01/31/2021 0700 Gross per 24 hour   Intake 2117.97 ml  Output 525 ml  Net 1592.97 ml   Last 3 Weights 01/31/2021 01/30/2021 01/29/2021  Weight (lbs) 220 lb 3.8 oz 227 lb 8.2 oz 229 lb 15 oz  Weight (kg) 99.9 kg 103.2 kg 104.3 kg      Telemetry    NSR w PACs and PVCs; last atrial tachycardia 1530h (starts straight off, without long-short initiation) - Personally Reviewed  ECG    No new tracing - Personally Reviewed  Physical Exam  Obtunded GEN: No acute distress.   Neck: No JVD Cardiac: RRR, no murmurs, rubs, or gallops.  Respiratory: Clear to auscultation bilaterally. GI: Soft, nontender, non-distended  MS: No edema; No deformity. Neuro:  R hemiplegia Psych: Normal affect   Labs    High Sensitivity Troponin:  No results for input(s): TROPONINIHS in the last 720 hours.    Chemistry Recent Labs  Lab 01/27/21 1737 01/27/21 2016 01/29/21 0640 01/29/21 1325 01/30/21 0748 01/30/21 1246 01/30/21 1844 01/31/21 0125 01/31/21 0645  NA 142   < > 152*   < > 153*   < > 149* 152* 153*  K 3.9   < > 3.7  --  3.8  --   --   --  4.0  CL  110   < > 123*  --  120*  --   --   --  116*  CO2 21*   < > 22  --  24  --   --   --  26  GLUCOSE 216*   < > 192*  --  101*  --   --   --  73  BUN 34*   < > 25*  --  27*  --   --   --  35*  CREATININE 1.11   < > 0.90  --  0.95  --   --   --  1.08  CALCIUM 9.1   < > 9.4  --  9.7  --   --   --  9.5  PROT 6.7  --   --   --   --   --   --   --   --   ALBUMIN 3.3*  --   --   --   --   --   --   --   --   AST 34  --   --   --   --   --   --   --   --   ALT 21  --   --   --   --   --   --   --   --   ALKPHOS 52  --   --   --   --   --   --   --   --   BILITOT 1.1  --   --   --   --   --   --   --   --   GFRNONAA >60   < > >60  --  >60  --   --   --  >60  ANIONGAP 11   < > 7  --  9  --   --   --  11   < > = values in this interval not displayed.     Hematology Recent Labs  Lab 01/29/21 0640 01/30/21 0748 01/31/21 0645  WBC 6.8 8.7 9.5  RBC 3.87* 4.04* 4.43  HGB 10.0* 10.6*  11.3*  HCT 33.8* 35.5* 38.8*  MCV 87.3 87.9 87.6  MCH 25.8* 26.2 25.5*  MCHC 29.6* 29.9* 29.1*  RDW 14.6 14.8 15.1  PLT 198 216 171    BNPNo results for input(s): BNP, PROBNP in the last 168 hours.   DDimer No results for input(s): DDIMER in the last 168 hours.   Radiology    CT HEAD WO CONTRAST  Result Date: 01/30/2021 CLINICAL DATA:  Intracranial hemorrhage EXAM: CT HEAD WITHOUT CONTRAST TECHNIQUE: Contiguous axial images were obtained from the base of the skull through the vertex without intravenous contrast. COMPARISON:  01/29/2021 FINDINGS: Brain: Left thalamic hemorrhage unchanged measuring 28 mm in diameter. Intraventricular hemorrhage unchanged. Negative for hydrocephalus Generalized atrophy with moderate white matter hypodensity diffusely unchanged. No acute ischemic infarct. No midline shift. Vascular: Negative for hyperdense vessel Skull: Negative Sinuses/Orbits: Mild mucosal edema paranasal sinuses. NG tube in place. Other: None IMPRESSION: Stable head CT. Left thalamic hemorrhage and intraventricular hemorrhage unchanged. Negative for hydrocephalus. Electronically Signed   By: Franchot Gallo M.D.   On: 01/30/2021 08:30   DG CHEST PORT 1 VIEW  Result Date: 01/30/2021 CLINICAL DATA:  Leukocytosis. EXAM: PORTABLE CHEST 1 VIEW COMPARISON:  Jan 27, 2021. FINDINGS: The heart size and mediastinal contours are within normal limits. Distal tip of feeding  tube is seen in proximal stomach. Both lungs are clear. The visualized skeletal structures are unremarkable. IMPRESSION: No active disease. Electronically Signed   By: Marijo Conception M.D.   On: 01/30/2021 11:38   VAS US CAROTID  Result Date: 01/30/2021 Carotid Arterial Duplex Study Patient Name:  Brian [name]  Date of Exam:   01/30/2021 Medical Rec #: [id number]      Accession #:    [id number] Date of Birth: May 18, 1936      Patient Gender: M Patient Age:   85Y Exam Location:  [facility] Procedure:      VAS US CAROTID Referring  Phys: [id number] Rosalin Hawking --------------------------------------------------------------------------------  Indications:       CVA. Risk Factors:      Hypertension, hyperlipidemia, Diabetes, prior CVA. Comparison Study:  no prior Performing Technologist: Abram Sander RVS  Examination Guidelines: A complete evaluation includes B-mode imaging, spectral Doppler, color Doppler, and power Doppler as needed of all accessible portions of each vessel. Bilateral testing is considered an integral part of a complete examination. Limited examinations for reoccurring indications may be performed as noted.  Right Carotid Findings: +----------+--------+--------+--------+------------------+--------+           PSV cm/sEDV cm/sStenosisPlaque DescriptionComments +----------+--------+--------+--------+------------------+--------+ CCA Prox  92      10              heterogenous               +----------+--------+--------+--------+------------------+--------+ CCA Distal80      16              heterogenous               +----------+--------+--------+--------+------------------+--------+ ICA Prox  59      15      1-39%   heterogenous               +----------+--------+--------+--------+------------------+--------+ ICA Distal86      19                                         +----------+--------+--------+--------+------------------+--------+ ECA       99                                                 +----------+--------+--------+--------+------------------+--------+ +----------+--------+-------+--------+-------------------+           PSV cm/sEDV cmsDescribeArm Pressure (mmHG) +----------+--------+-------+--------+-------------------+ Subclavian109                                        +----------+--------+-------+--------+-------------------+ +---------+--------+--+--------+--+---------+ VertebralPSV cm/s58EDV cm/s12Antegrade +---------+--------+--+--------+--+---------+  Left  Carotid Findings: +----------+--------+--------+--------+------------------+--------+           PSV cm/sEDV cm/sStenosisPlaque DescriptionComments +----------+--------+--------+--------+------------------+--------+ CCA Prox  98      11              heterogenous               +----------+--------+--------+--------+------------------+--------+ CCA Distal64      12              heterogenous               +----------+--------+--------+--------+------------------+--------+ ICA Prox  55      13  1-39%   heterogenous               +----------+--------+--------+--------+------------------+--------+ ICA Distal62      13                                         +----------+--------+--------+--------+------------------+--------+ ECA       81                                                 +----------+--------+--------+--------+------------------+--------+ +----------+--------+--------+--------+-------------------+           PSV cm/sEDV cm/sDescribeArm Pressure (mmHG) +----------+--------+--------+--------+-------------------+ [id number]                                          +----------+--------+--------+--------+-------------------+ +---------+--------+--+--------+--+---------+ VertebralPSV cm/s53EDV cm/s10Antegrade +---------+--------+--+--------+--+---------+   Summary: Right Carotid: Velocities in the right ICA are consistent with a 1-39% stenosis. Left Carotid: Velocities in the left ICA are consistent with a 1-39% stenosis. Vertebrals: Bilateral vertebral arteries demonstrate antegrade flow. *See table(s) above for measurements and observations.     Preliminary     Cardiac Studies   TTE 03/2018:  Left ventricle: The cavity size was normal. There was moderate  concentric hypertrophy. Systolic function was mildly reduced. The  estimated ejection fraction was in the range of 45% to 50%. Mild  global hypokinesis that is slightly worse in the  inferolateral  myocardium. Doppler parameters are consistent with abnormal left  ventricular relaxation (grade 1 diastolic dysfunction). Doppler  parameters are consistent with indeterminate ventricular filling  pressure.  - Aortic valve: Transvalvular velocity was within the normal range.  There was no stenosis. There was no regurgitation.  - Mitral valve: Transvalvular velocity was within the normal range.  There was no evidence for stenosis. There was no regurgitation.  - Left atrium: The atrium was moderately dilated.  - Right ventricle: The cavity size was normal. Wall thickness was  normal. Systolic function was normal.  - Right atrium: The atrium was moderately dilated.  - Atrial septum: No defect or patent foramen ovale was identified  by color flow Doppler.  - Tricuspid valve: There was trivial regurgitation.  - Pulmonary arteries: Systolic pressure was within the normal  range. PA peak pressure: 31 mm Hg (S).   TTE 01/24/2021: 1. Left ventricular ejection fraction, by estimation, is 55 to 60%. The  left ventricle has normal function. The left ventricle has no regional  wall motion abnormalities. Left ventricular diastolic parameters are  consistent with Grade I diastolic  dysfunction (impaired relaxation).  2. Right ventricular systolic function is normal. The right ventricular  size is normal.  3. Left atrial size was mildly dilated.  4. The mitral valve is normal in structure. No evidence of mitral valve  regurgitation. No evidence of mitral stenosis.  5. The aortic valve is normal in structure. Aortic valve regurgitation is  not visualized. Mild aortic valve sclerosis is present, with no evidence  of aortic valve stenosis.  6. The inferior vena cava is normal in size with greater than 50%  respiratory variability, suggesting right atrial pressure of 3 mmHg.   Event monitor 05/26/2018: Predominant rhythm is sinus rhythm Multiple episodes of  supraventricular tachycardia are noted (17) There is also a single episode of atrial fibrillation observed   Patient Profile     85 y.o. male with a history of paroxysmal AF, recurrent stroke, type 2 DM, and HTN., admitted for a thalamic hemorrhage, complicated by recurrent brief paroxysmal narrow complex short RP tachycardia.   Assessment & Plan    1. PSVT: 12 lead appearance w retrograde P in lead I and response to adenosine suggests AVNRT, but it starts and stops without triggering ectopy or long-short sequence. Improved on carvedilol + amiodarone. Switch to enteral administration of amio. 2. Hx of PAFib: none so far on this admission. Anticoagulants stopped/reversed for ICH. 3. HTN: per Neuro.      For questions or updates, please contact [place] Please consult [email or web address] for contact info under        Signed, Sanda Klein, MD  01/31/2021, 11:06 AM

## 2021-02-01 ENCOUNTER — Inpatient Hospital Stay (HOSPITAL_COMMUNITY): Payer: Medicare HMO

## 2021-02-01 DIAGNOSIS — I471 Supraventricular tachycardia: Secondary | ICD-10-CM | POA: Diagnosis not present

## 2021-02-01 DIAGNOSIS — I10 Essential (primary) hypertension: Secondary | ICD-10-CM | POA: Diagnosis not present

## 2021-02-01 DIAGNOSIS — I619 Nontraumatic intracerebral hemorrhage, unspecified: Secondary | ICD-10-CM | POA: Diagnosis not present

## 2021-02-01 DIAGNOSIS — I169 Hypertensive crisis, unspecified: Secondary | ICD-10-CM | POA: Diagnosis not present

## 2021-02-01 DIAGNOSIS — I610 Nontraumatic intracerebral hemorrhage in hemisphere, subcortical: Secondary | ICD-10-CM | POA: Diagnosis not present

## 2021-02-01 LAB — GLUCOSE, CAPILLARY
Glucose-Capillary: 102 mg/dL — ABNORMAL HIGH (ref 70–99)
Glucose-Capillary: 115 mg/dL — ABNORMAL HIGH (ref 70–99)
Glucose-Capillary: 134 mg/dL — ABNORMAL HIGH (ref 70–99)
Glucose-Capillary: 156 mg/dL — ABNORMAL HIGH (ref 70–99)
Glucose-Capillary: 159 mg/dL — ABNORMAL HIGH (ref 70–99)
Glucose-Capillary: 52 mg/dL — ABNORMAL LOW (ref 70–99)
Glucose-Capillary: 99 mg/dL (ref 70–99)

## 2021-02-01 LAB — BASIC METABOLIC PANEL
Anion gap: 5 (ref 5–15)
BUN: 36 mg/dL — ABNORMAL HIGH (ref 8–23)
CO2: 26 mmol/L (ref 22–32)
Calcium: 9.3 mg/dL (ref 8.9–10.3)
Chloride: 119 mmol/L — ABNORMAL HIGH (ref 98–111)
Creatinine, Ser: 1.09 mg/dL (ref 0.61–1.24)
GFR, Estimated: >60 mL/min (ref >60)
Glucose, Bld: 159 mg/dL — ABNORMAL HIGH (ref 70–99)
Potassium: 3.9 mmol/L (ref 3.5–5.1)
Sodium: 150 mmol/L — ABNORMAL HIGH (ref 135–145)

## 2021-02-01 LAB — CBC
HCT: 33.6 % — ABNORMAL LOW (ref 39.0–52.0)
Hemoglobin: 9.6 g/dL — ABNORMAL LOW (ref 13.0–17.0)
MCH: 25.7 pg — ABNORMAL LOW (ref 26.0–34.0)
MCHC: 28.6 g/dL — ABNORMAL LOW (ref 30.0–36.0)
MCV: 90.1 fL (ref 80.0–100.0)
Platelets: 231 10*3/uL (ref 150–400)
RBC: 3.73 MIL/uL — ABNORMAL LOW (ref 4.22–5.81)
RDW: 15.1 % (ref 11.5–15.5)
WBC: 11.3 10*3/uL — ABNORMAL HIGH (ref 4.0–10.5)
nRBC: 0.3 % — ABNORMAL HIGH (ref 0.0–0.2)

## 2021-02-01 MED ORDER — DEXTROSE 50 % IV SOLN
INTRAVENOUS | Status: AC
  Filled 2021-02-01: qty 50

## 2021-02-01 MED ORDER — DEXTROSE 50 % IV SOLN
25.0000 g | INTRAVENOUS | Status: AC
  Administered 2021-02-01: 25 g via INTRAVENOUS

## 2021-02-01 MED ORDER — INSULIN DETEMIR 100 UNIT/ML ~~LOC~~ SOLN
20.0000 [IU] | Freq: Two times a day (BID) | SUBCUTANEOUS | Status: DC
  Administered 2021-02-01 – 2021-02-04 (×7): 20 [IU] via SUBCUTANEOUS
  Filled 2021-02-01 (×10): qty 0.2

## 2021-02-01 MED ORDER — AMIODARONE HCL 200 MG PO TABS
400.0000 mg | ORAL_TABLET | Freq: Every day | ORAL | Status: AC
  Administered 2021-02-07 – 2021-02-27 (×21): 400 mg
  Filled 2021-02-01 (×21): qty 2

## 2021-02-01 MED ORDER — AMIODARONE HCL 200 MG PO TABS
200.0000 mg | ORAL_TABLET | Freq: Every day | ORAL | Status: DC
  Administered 2021-02-28: 200 mg
  Filled 2021-02-01: qty 1

## 2021-02-01 NOTE — Progress Notes (Signed)
Two more episodes of regular SVT 150 bpm with abrupt onset and spontaneous termination, lasting 9 minutes and 11 minutes, respectively, at 1500h and 2100h. No AFib. Now on PO amiodarone and carvedilol.  [facility] will sign off.   Medication Recommendations:   - Recommend reducing amiodarone to 400 mg daily in a week, 200 mg daily after another 3 weeks. - The dose of carvedilol may need to be reduced as the amiodarone fully "kicks in" if he develops sinus bradycardia. - off Eliquis for the foreseeable future Other recommendations (labs, testing, etc):  LFTs and TSH every 6 months while on amiodarone. Follow up as an outpatient:  Please notify us of DC plans.

## 2021-02-01 NOTE — Progress Notes (Signed)
PROGRESS NOTE    Brian [name]  ZOX:[id number] DOB: September 01, 1936 DOA: 01/23/2021 PCP: [name], MD   Brief Narrative:  Patient is 85 yo male admitted to [facility] on 01/23/2021 for acute onset right sided weakness and language deficit. Pertinent PMH of HTN, HLD, EF 45-50% on 2019, T2DM, Chronic anemia, Afib on Eliquis at home, prior L MCa Stroke. CT head w/o contrast and MRI on 5/9 showing L thalamic intracranial hemorrhage with cytoxic edema/with mild 33mm left to right shift. Patient was admitted with known Afib on anticoagulation with Apixaban which was reversed in ED with Andexanet Alpha. Also requiring Cleviprex infusion for hypertensive emergency. Admitted to Neuro ICU under the stroke service.   Course complicated with ongoing HTN requiring Cleviprex infusion. Mentation initially somewhat improving. Patient developed fever with increased lethargy on 5/12. Failed swallow eval and core-track was placed. Cleviprex stopped on 5/12 and started on oral anti hypertensives. On 5/13 low grade fever persist with downtrending WBC. EEG shows focal left-sided slowing but no focal seizures.  Repeat imaging shows what appears to be evolving intracranial hemorrhaging per neurology with poor prognosis.  Transitioning out of the ICU, hospitalist called to take over care on 02/01/2021.  Assessment & Plan:   Active Problems:   Hypertensive crisis   ICH (intracerebral hemorrhage) (HCC)   SVT (supraventricular tachycardia) (HCC)   ICH previously on eliquis; s/p andexant alpha, POA History of ischemic stroke - Left thalamic ICH with 7 mm midline shift - Neurology following, appreciate insight and recommendations -repeat imaging shows evolving intracranial hemorrhage on the 15th 16th and 18 May - Induced hypernatremia in the setting of cerebral edema, now downtrending - stopped eliquis; reversed at admit - Statin ongoing  Hypertensive emergency, remains very resistant despite increased regimen - Stroke team is  following - SBP goal less than 160 - Continue coreg, losartan, amlodipine; PRN labetalol and hydralazine - Cardiology following - Neuroprotective measures as above - Aspiration precautions  Sustained atrial tachycardia - developed 2 episodes on 5/16 of SVT - Cardiology following, appreciate insights/recs - on carvedilol, amiodarone with as needed labetalol  History of Atrial Fibrillation - Chronically anticoagulated on Eliquis, this was reversed on arrival  - Holding anticoagulation due to [place] - Follow rate control as above  Possible aspiration pneumonia, resolving - Complete 7 days of unasyn -Afebrile for 24 hours - CXR (5/16)- clear lungs bilaterally - Continue tube feeds via cortrak - Aspiration precautions, NTS PRN  Diabetes type 2 -Increase Levemir 20 twice daily, discontinue Premeal coverage given he is on continuous tube feeds -Continue every 6 hour Accu-Chek; continue hypoglycemic protocol -CBG goal 140-180  Dysphagia secondary to mental status and stroke - Continue tube feeds via core track - Aspiration precautions - SLP following - NTS PRN  Acute anemia likely due to critical illness (normocytic, hypochromic) -transfuse for Hb <7 or hemodynamically significant bleeding    DVT prophylaxis: None given intracranial hemorrhage as above Code Status: Full Family Communication: None present  Status is: Inpatient  Dispo: The patient is from: Home              Anticipated d/c is to: SNF              Anticipated d/c date is: >72h              Patient currently not medically stable for discharge  Consultants:   Cards, Neuro, PCCM  Procedures:   None  Antimicrobials:  Completed Unasyn as above  Subjective: No acute issues  or events overnight patient's review of systems markedly limited given mental status  Objective: Vitals:   02/01/21 0430 02/01/21 0500 02/01/21 0600 02/01/21 0700  BP:  (!) 161/75 (!) 159/82   Pulse: 73 67 74 77  Resp: 16 15 16  17   Temp:      TempSrc:      SpO2: 95% 94% 94% 95%  Weight:      Height:        Intake/Output Summary (Last 24 hours) at 02/01/2021 0710 Last data filed at 02/01/2021 0600 Gross per 24 hour  Intake 2559.84 ml  Output 975 ml  Net 1584.84 ml   Filed Weights   01/30/21 0400 01/31/21 0500 02/01/21 0355  Weight: 103.2 kg 99.9 kg 100 kg    Examination:  General:  Pleasantly resting in bed, No acute distress.  Poorly interactive HEENT: Pinpoint nonreactive pupils, minimal reflex, core track in place Neck:  Without mass or deformity.  Trachea is midline. Lungs: Coarse breath sounds bilaterally without overt wheeze or rales Heart:  Regular rate and rhythm.  Without murmurs, rubs, or gallops. Abdomen:  Soft, nontender, nondistended.  Without guarding or rebound. Extremities: Right upper extremity flaccid, bilateral lower extremities and left upper extremity nonreactive, moving left upper extremity occasionally at will but without purpose. Vascular:  Dorsalis pedis and posterior tibial pulses palpable bilaterally. Skin:  Warm and dry, no erythema, no ulcerations.   Data Reviewed: I have personally reviewed following labs and imaging studies  CBC: Recent Labs  Lab 01/27/21 0445 01/27/21 1737 01/28/21 0439 01/29/21 0640 01/30/21 0748 01/31/21 0645 02/01/21 0036  WBC 10.6*   < > 7.8 6.8 8.7 9.5 11.3*  NEUTROABS 7.9*  --   --   --   --   --   --   HGB 11.4*   < > 10.4* 10.0* 10.6* 11.3* 9.6*  HCT 37.1*   < > 34.8* 33.8* 35.5* 38.8* 33.6*  MCV 85.1   < > 87.4 87.3 87.9 87.6 90.1  PLT 165   < > 159 198 216 171 231   < > = values in this interval not displayed.   Basic Metabolic Panel: Recent Labs  Lab 01/25/21 1645 01/26/21 0744 01/26/21 0907 01/26/21 1708 01/27/21 0445 01/27/21 1737 01/27/21 2016 01/28/21 0439 01/28/21 1309 01/29/21 0640 01/29/21 1325 01/30/21 0748 01/30/21 1246 01/30/21 1844 01/31/21 0125 01/31/21 0645 02/01/21 0036  NA  --   --    < >  --    <  > 142   < > 147*   < > 152*   < > 153* 151* 149* 152* 153* 150*  K  --   --    < >  --    < > 3.9  --  4.8  --  3.7  --  3.8  --   --   --  4.0 3.9  CL  --   --    < >  --    < > 110  --  116*  --  123*  --  120*  --   --   --  116* 119*  CO2  --   --    < >  --    < > 21*  --  19*  --  22  --  24  --   --   --  26 26  GLUCOSE  --   --    < >  --    < > 216*  --  163*  --  192*  --  101*  --   --   --  73 159*  BUN  --   --    < >  --    < > 34*  --  30*  --  25*  --  27*  --   --   --  35* 36*  CREATININE  --   --    < >  --    < > 1.11  --  0.98  --  0.90  --  0.95  --   --   --  1.08 1.09  CALCIUM  --   --    < >  --    < > 9.1  --  9.1  --  9.4  --  9.7  --   --   --  9.5 9.3  MG 1.7 1.8  --  1.8  --  2.0  --   --   --   --   --   --   --   --   --   --   --   PHOS 3.2 2.3*  --  3.2  --   --   --   --   --   --   --   --   --   --   --   --   --    < > = values in this interval not displayed.   GFR: Estimated Creatinine Clearance: 62.9 mL/min (by C-G formula based on SCr of 1.09 mg/dL). Liver Function Tests: Recent Labs  Lab 01/27/21 1737  AST 34  ALT 21  ALKPHOS 52  BILITOT 1.1  PROT 6.7  ALBUMIN 3.3*   No results for input(s): LIPASE, AMYLASE in the last 168 hours. No results for input(s): AMMONIA in the last 168 hours. Coagulation Profile: No results for input(s): INR, PROTIME in the last 168 hours. Cardiac Enzymes: No results for input(s): CKTOTAL, CKMB, CKMBINDEX, TROPONINI in the last 168 hours. BNP (last 3 results) No results for input(s): PROBNP in the last 8760 hours. HbA1C: No results for input(s): HGBA1C in the last 72 hours. CBG: Recent Labs  Lab 01/31/21 1537 01/31/21 1946 01/31/21 2345 02/01/21 0340 02/01/21 0407  GLUCAP 132* 198* 170* 52* 99   Lipid Profile: No results for input(s): CHOL, HDL, LDLCALC, TRIG, CHOLHDL, LDLDIRECT in the last 72 hours. Thyroid Function Tests: No results for input(s): TSH, T4TOTAL, FREET4, T3FREE, THYROIDAB in the last 72  hours. Anemia Panel: No results for input(s): VITAMINB12, FOLATE, FERRITIN, TIBC, IRON, RETICCTPCT in the last 72 hours. Sepsis Labs: No results for input(s): PROCALCITON, LATICACIDVEN in the last 168 hours.  Recent Results (from the past 240 hour(s))  MRSA PCR Screening     Status: None   Collection Time: 01/23/21 10:45 PM   Specimen: Nasopharyngeal Wash  Result Value Ref Range Status   MRSA by PCR NEGATIVE NEGATIVE Final    Comment:        The GeneXpert MRSA Assay (FDA approved for NASAL specimens only), is one component of a comprehensive MRSA colonization surveillance program. It is not intended to diagnose MRSA infection nor to guide or monitor treatment for MRSA infections. Performed at [facility], [place] [address] [place], [place] [address]   SARS CORONAVIRUS 2 (TAT 6-24 HRS) Nasopharyngeal Nasopharyngeal Swab     Status: None   Collection Time: 01/23/21 11:58 PM   Specimen: Nasopharyngeal Swab  Result Value Ref Range  Status   SARS Coronavirus 2 NEGATIVE NEGATIVE Final    Comment: (NOTE) SARS-CoV-2 target nucleic acids are NOT DETECTED.  The SARS-CoV-2 RNA is generally detectable in upper and lower respiratory specimens during the acute phase of infection. Negative results do not preclude SARS-CoV-2 infection, do not rule out co-infections with other pathogens, and should not be used as the sole basis for treatment or other patient management decisions. Negative results must be combined with clinical observations, patient history, and epidemiological information. The expected result is Negative.  Fact Sheet for Patients: SugarRoll.be  Fact Sheet for Healthcare Providers: https://www.woods-mathews.com/  This test is not yet approved or cleared by the [place] FDA and  has been authorized for detection and/or diagnosis of SARS-CoV-2 by FDA under an Emergency Use Authorization (EUA). This EUA will remain  in  effect (meaning this test can be used) for the duration of the COVID-19 declaration under Se ction 564(b)(1) of the Act, 21 U.S.C. section 360bbb-3(b)(1), unless the authorization is terminated or revoked sooner.  Performed at [facility], [place] [address], [place], [place] [address]          Radiology Studies: DG CHEST PORT 1 VIEW  Result Date: 01/30/2021 CLINICAL DATA:  Leukocytosis. EXAM: PORTABLE CHEST 1 VIEW COMPARISON:  Jan 27, 2021. FINDINGS: The heart size and mediastinal contours are within normal limits. Distal tip of feeding tube is seen in proximal stomach. Both lungs are clear. The visualized skeletal structures are unremarkable. IMPRESSION: No active disease. Electronically Signed   By: Marijo Conception M.D.   On: 01/30/2021 11:38   VAS US CAROTID  Result Date: 01/30/2021 Carotid Arterial Duplex Study Patient Name:  Brian [name]  Date of Exam:   01/30/2021 Medical Rec #: [id number]      Accession #:    [id number] Date of Birth: 1936/02/11      Patient Gender: M Patient Age:   85Y Exam [facility]:  [facility] Procedure:      VAS US CAROTID Referring Phys: [id number] Rosalin Hawking --------------------------------------------------------------------------------  Indications:       CVA. Risk Factors:      Hypertension, hyperlipidemia, Diabetes, prior CVA. Comparison Study:  no prior Performing Technologist: Abram Sander RVS  Examination Guidelines: A complete evaluation includes B-mode imaging, spectral Doppler, color Doppler, and power Doppler as needed of all accessible portions of each vessel. Bilateral testing is considered an integral part of a complete examination. Limited examinations for reoccurring indications may be performed as noted.  Right Carotid Findings: +----------+--------+--------+--------+------------------+--------+           PSV cm/sEDV cm/sStenosisPlaque DescriptionComments +----------+--------+--------+--------+------------------+--------+ CCA Prox   92      10              heterogenous               +----------+--------+--------+--------+------------------+--------+ CCA Distal80      16              heterogenous               +----------+--------+--------+--------+------------------+--------+ ICA Prox  59      15      1-39%   heterogenous               +----------+--------+--------+--------+------------------+--------+ ICA Distal86      19                                         +----------+--------+--------+--------+------------------+--------+  ECA       99                                                 +----------+--------+--------+--------+------------------+--------+ +----------+--------+-------+--------+-------------------+           PSV cm/sEDV cmsDescribeArm Pressure (mmHG) +----------+--------+-------+--------+-------------------+ Subclavian109                                        +----------+--------+-------+--------+-------------------+ +---------+--------+--+--------+--+---------+ VertebralPSV cm/s58EDV cm/s12Antegrade +---------+--------+--+--------+--+---------+  Left Carotid Findings: +----------+--------+--------+--------+------------------+--------+           PSV cm/sEDV cm/sStenosisPlaque DescriptionComments +----------+--------+--------+--------+------------------+--------+ CCA Prox  98      11              heterogenous               +----------+--------+--------+--------+------------------+--------+ CCA Distal64      12              heterogenous               +----------+--------+--------+--------+------------------+--------+ ICA Prox  55      13      1-39%   heterogenous               +----------+--------+--------+--------+------------------+--------+ ICA Distal62      13                                         +----------+--------+--------+--------+------------------+--------+ ECA       81                                                  +----------+--------+--------+--------+------------------+--------+ +----------+--------+--------+--------+-------------------+           PSV cm/sEDV cm/sDescribeArm Pressure (mmHG) +----------+--------+--------+--------+-------------------+ [id number]                                          +----------+--------+--------+--------+-------------------+ +---------+--------+--+--------+--+---------+ VertebralPSV cm/s53EDV cm/s10Antegrade +---------+--------+--+--------+--+---------+   Summary: Right Carotid: Velocities in the right ICA are consistent with a 1-39% stenosis. Left Carotid: Velocities in the left ICA are consistent with a 1-39% stenosis. Vertebrals: Bilateral vertebral arteries demonstrate antegrade flow. *See table(s) above for measurements and observations.     Preliminary         Scheduled Meds: .  stroke: mapping our early stages of recovery book   Does not apply Once  . amiodarone  400 mg Per Tube BID  . amLODipine  10 mg Per Tube Daily  . carvedilol  25 mg Per Tube BID WC  . chlorhexidine  15 mL Mouth Rinse BID  . Chlorhexidine Gluconate Cloth  6 each Topical Daily  . enoxaparin (LOVENOX) injection  40 mg Subcutaneous Q24H  . feeding supplement (PROSource TF)  45 mL Per Tube TID  . free water  100 mL Per Tube Q4H  . gabapentin  100 mg Per Tube TID  . insulin aspart  3-9 Units Subcutaneous Q4H  . insulin aspart  5 Units  Subcutaneous Q4H  . insulin detemir  15 Units Subcutaneous BID  . losartan  50 mg Per Tube BID  . mouth rinse  15 mL Mouth Rinse q12n4p  . pantoprazole sodium  40 mg Per Tube QHS  . senna-docusate  1 tablet Per Tube BID  . sodium chloride flush  3 mL Intravenous Once   Continuous Infusions: . sodium chloride 10 mL/hr at 02/01/21 0600  . ampicillin-sulbactam (UNASYN) IV Stopped (02/01/21 0215)  . feeding supplement (JEVITY 1.5 CAL/FIBER) 1,000 mL (01/31/21 0525)     LOS: 9 days   Time spent: 78min  Nelsy Madonna C Jsoeph Podesta, DO [facility]  If 7PM-7AM, please contact night-coverage [email or web address]  02/01/2021, 7:10 AM

## 2021-02-01 NOTE — Progress Notes (Signed)
  Speech Language Pathology Treatment: Dysphagia  Patient Details Name: Brian [name] MRN: [id number] DOB: [birth year] Today's Date: 02/01/2021 Time: [id number] SLP Time Calculation (min) (ACUTE ONLY): 30 min  Assessment / Plan / Recommendation Clinical Impression  Pt consistently alert throughout entire treatment session without cueing. He initially resisted oral care, but ultimately allowed prior to PO trials. Active oral manipulation of ice chips and pureed solids observed with consistent audible swallows, intermittent congested throat clearing and immediate coughing noted across trials. Suspect delay in swallow initiation resulting in airway compromise. Trials of NTL via teaspoon eliminated clinical s/sx of aspiration in 2/3 trials with no overt s/sx noted with HTL via teaspoon. Pt demonstrates readiness for instrumental testing to further assess swallow physiology. Scheduling conflicts prohibit from completion today, however plan to schedule with radiology pending level of alertness maintains in order to participate.    HPI HPI: Pt is a 85 y.o. M who presents with acute onset R sided weakness, facial droop, numbness, aphasia with left gaze preference. CTH showing L thalamic ICH. Significant PMH: afib, DM2, HLD, HTN, stroke      SLP Plan  Continue with current plan of care;MBS       Recommendations  Diet recommendations: NPO Medication Administration: Via alternative means                Oral Care Recommendations: Oral care QID Follow up Recommendations: Skilled Nursing facility SLP Visit Diagnosis: Dysphagia, unspecified (R13.10);Aphasia (R47.01) Plan: Continue with current plan of care;MBS       [facility], [place], [place] Office Number: [phone]   Brian [name] 02/01/2021, 3:10 PM

## 2021-02-01 NOTE — Progress Notes (Signed)
STROKE TEAM PROGRESS NOTE   SUBJECTIVE (INTERVAL HISTORY) Brother is at the bedside. Pt lying in bed, awake alert eyes open, less secretions in the throat. Afebrile overnight but WBC elevated today. Did have hypoglycemia after novolog correction dose. DM coordinator on board recommend decrease novolog correction dose.    OBJECTIVE Temp:  [98.5 F (36.9 C)-99 F (37.2 C)] 98.7 F (37.1 C) (05/18 0800) Pulse Rate:  [59-79] 67 (05/18 1100) Cardiac Rhythm: Normal sinus rhythm (05/18 0730) Resp:  [10-21] 17 (05/18 1100) BP: (126-163)/(54-106) 138/54 (05/18 1100) SpO2:  [93 %-99 %] 95 % (05/18 1100) Weight:  [100 kg] 100 kg (05/18 0355)  Recent Labs  Lab 01/31/21 2345 02/01/21 0340 02/01/21 0407 02/01/21 0807 02/01/21 1147  GLUCAP 170* 52* 99 156* 102*   Recent Labs  Lab 01/25/21 1645 01/26/21 0744 01/26/21 0907 01/26/21 1708 01/27/21 0445 01/27/21 1737 01/27/21 2016 01/28/21 0439 01/28/21 1309 01/29/21 0640 01/29/21 1325 01/30/21 0748 01/30/21 1246 01/30/21 1844 01/31/21 0125 01/31/21 0645 02/01/21 0036  NA  --   --    < >  --    < > 142   < > 147*   < > 152*   < > 153* 151* 149* 152* 153* 150*  K  --   --    < >  --    < > 3.9  --  4.8  --  3.7  --  3.8  --   --   --  4.0 3.9  CL  --   --    < >  --    < > 110  --  116*  --  123*  --  120*  --   --   --  116* 119*  CO2  --   --    < >  --    < > 21*  --  19*  --  22  --  24  --   --   --  26 26  GLUCOSE  --   --    < >  --    < > 216*  --  163*  --  192*  --  101*  --   --   --  73 159*  BUN  --   --    < >  --    < > 34*  --  30*  --  25*  --  27*  --   --   --  35* 36*  CREATININE  --   --    < >  --    < > 1.11  --  0.98  --  0.90  --  0.95  --   --   --  1.08 1.09  CALCIUM  --   --    < >  --    < > 9.1  --  9.1  --  9.4  --  9.7  --   --   --  9.5 9.3  MG 1.7 1.8  --  1.8  --  2.0  --   --   --   --   --   --   --   --   --   --   --   PHOS 3.2 2.3*  --  3.2  --   --   --   --   --   --   --   --   --   --   --    --   --    < > =  values in this interval not displayed.   Recent Labs  Lab 01/27/21 1737  AST 34  ALT 21  ALKPHOS 52  BILITOT 1.1  PROT 6.7  ALBUMIN 3.3*   Recent Labs  Lab 01/27/21 0445 01/27/21 1737 01/28/21 0439 01/29/21 0640 01/30/21 0748 01/31/21 0645 02/01/21 0036  WBC 10.6*   < > 7.8 6.8 8.7 9.5 11.3*  NEUTROABS 7.9*  --   --   --   --   --   --   HGB 11.4*   < > 10.4* 10.0* 10.6* 11.3* 9.6*  HCT 37.1*   < > 34.8* 33.8* 35.5* 38.8* 33.6*  MCV 85.1   < > 87.4 87.3 87.9 87.6 90.1  PLT 165   < > 159 198 216 171 231   < > = values in this interval not displayed.   No results for input(s): CKTOTAL, CKMB, CKMBINDEX, TROPONINI in the last 168 hours. No results for input(s): LABPROT, INR in the last 72 hours. No results for input(s): COLORURINE, LABSPEC, [place], GLUCOSEU, HGBUR, BILIRUBINUR, KETONESUR, PROTEINUR, UROBILINOGEN, NITRITE, LEUKOCYTESUR in the last 72 hours.  Invalid input(s): APPERANCEUR     Component Value Date/Time   CHOL 113 01/28/2021 0439   CHOL 141 10/09/2018 1328   TRIG 41 01/28/2021 0439   HDL 41 01/28/2021 0439   HDL 57 10/09/2018 1328   CHOLHDL 2.8 01/28/2021 0439   VLDL 8 01/28/2021 0439   LDLCALC 64 01/28/2021 0439   LDLCALC 69 10/09/2018 1328   Lab Results  Component Value Date   HGBA1C 6.1 (H) 01/28/2021      Component Value Date/Time   LABOPIA NONE DETECTED 04/01/2018 1148   COCAINSCRNUR NONE DETECTED 04/01/2018 1148   LABBENZ NONE DETECTED 04/01/2018 1148   AMPHETMU NONE DETECTED 04/01/2018 1148   THCU NONE DETECTED 04/01/2018 1148   LABBARB (A) 04/01/2018 1148    Result not available. Reagent lot number recalled by manufacturer.    No results for input(s): ETH in the last 168 hours.  I have personally reviewed the radiological images below and agree with the radiology interpretations.  CT HEAD WO CONTRAST  Result Date: 02/01/2021 CLINICAL DATA:  85 year old male with left thalamic hemorrhage presenting on 01/23/2021.  EXAM: CT HEAD WITHOUT CONTRAST TECHNIQUE: Contiguous axial images were obtained from the base of the skull through the vertex without intravenous contrast. COMPARISON:  Head CTs 01/30/2021 and earlier. FINDINGS: Brain: Rounded hyperdense hemorrhage centered at the left thalamus is 28 x 29 x 26 mm (AP by transverse by CC) now for an estimated blood volume of 11 mL, mildly decreased compared to 01/23/2021 (16 mL estimated at that time). Surrounding edema and mild regional mass effect remain stable. Small volume of intraventricular extension of blood is stable along with ventricle size and configuration. No new intracranial hemorrhage. No significant midline shift. Basilar cisterns remain normal. Stable gray-white matter differentiation throughout the brain. Chronic bilateral white matter disease, chronic right cerebellar infarcts. Vascular: Calcified atherosclerosis at the skull base. Skull: No acute osseous abnormality identified. Sinuses/Orbits: Visualized paranasal sinuses and mastoids are stable and well aerated. Other: Right nasoenteric tube remains in place. Stable orbit and scalp soft tissues. IMPRESSION: 1. Slowly evolving left thalamic hemorrhage, mildly decreased in volume since 01/23/2021. Stable small volume IVH, regional edema and mass effect. 2. No new intracranial abnormality. Electronically Signed   By: Genevie Ann M.D.   On: 02/01/2021 08:06   CT HEAD WO CONTRAST  Result Date: 01/30/2021 CLINICAL DATA:  Intracranial hemorrhage EXAM: CT HEAD  WITHOUT CONTRAST TECHNIQUE: Contiguous axial images were obtained from the base of the skull through the vertex without intravenous contrast. COMPARISON:  01/29/2021 FINDINGS: Brain: Left thalamic hemorrhage unchanged measuring 28 mm in diameter. Intraventricular hemorrhage unchanged. Negative for hydrocephalus Generalized atrophy with moderate white matter hypodensity diffusely unchanged. No acute ischemic infarct. No midline shift. Vascular: Negative for  hyperdense vessel Skull: Negative Sinuses/Orbits: Mild mucosal edema paranasal sinuses. NG tube in place. Other: None IMPRESSION: Stable head CT. Left thalamic hemorrhage and intraventricular hemorrhage unchanged. Negative for hydrocephalus. Electronically Signed   By: Franchot Gallo M.D.   On: 01/30/2021 08:30   CT HEAD WO CONTRAST  Result Date: 01/29/2021 CLINICAL DATA:  Cerebral hemorrhage suspected EXAM: CT HEAD WITHOUT CONTRAST TECHNIQUE: Contiguous axial images were obtained from the base of the skull through the vertex without intravenous contrast. COMPARISON:  Two days ago FINDINGS: Brain: Acute hematoma centered in the left thalamus and adjacent white matter, 3.1 cm in maximal span on axial images, unchanged when remeasured in a similar fashion. Intraventricular hemorrhage seen at the lateral ventricles with stable mild ventriculomegaly. Chronic small vessel ischemic type low-density in the hemispheric white matter. No evidence of acute cortical infarct. Small remote right cerebellar infarct. Regional mass effect from the hematoma is unchanged. Vascular: Atheromatous calcification Skull: Negative Sinuses/Orbits: Nasal intubation. IMPRESSION: 1. No progression of the left thalamic or intraventricular hemorrhage. 2. Unchanged ventriculomegaly. Electronically Signed   By: Monte Fantasia M.D.   On: 01/29/2021 10:21   CT Head Wo Contrast  Result Date: 01/27/2021 CLINICAL DATA:  Follow-up hemorrhagic stroke involving the LEFT thalamus with extension into the ventricular system. EXAM: CT HEAD WITHOUT CONTRAST TECHNIQUE: Contiguous axial images were obtained from the base of the skull through the vertex without intravenous contrast. COMPARISON:  01/25/2021 and earlier. FINDINGS: Brain: Hematoma involving the LEFT thalamus measuring approximately 2.6 x 2.8 x 2.8 cm, unchanged in size since the initial 01/23/2021 CT. Interval decrease in the amount of intraventricular hemorrhage in the lateral ventricles and  the third ventricle. Mass-effect with midline shift of approximately 7 mm to the RIGHT, unchanged. No new hemorrhage or hematoma. No extra-axial fluid collections. Vascular: Mild BILATERAL carotid siphon and vertebral artery atherosclerosis. Hyperdense vessel. Skull: No skull fracture or other focal osseous abnormality involving the skull. Sinuses/Orbits: Paranasal sinuses, mastoid air cells and middle ear cavities well-aerated. Benign senile scleral calcifications in both eyes. Other: None. IMPRESSION: 1. Stable LEFT thalamic hematoma and associated midline shift to the RIGHT of approximately 7 mm. 2. Decreasing intraventricular hemorrhage. 3. No new intracranial abnormality. Electronically Signed   By: Evangeline Dakin M.D.   On: 01/27/2021 10:55   CT HEAD WO CONTRAST  Result Date: 01/25/2021 CLINICAL DATA:  Follow-up examination for acute stroke. EXAM: CT HEAD WITHOUT CONTRAST TECHNIQUE: Contiguous axial images were obtained from the base of the skull through the vertex without intravenous contrast. COMPARISON:  Prior CT from 01/23/2021. FINDINGS: Brain: Intraparenchymal hemorrhage centered at the left thalamus is not significantly changed in size or morphology as compared to previous exam. Surrounding edema is stable to mildly increased. Similar regional mass effect with localized left-to-right shift. Intraventricular extension with blood within the lateral and third ventricles, relatively stable. Stable ventricular size and morphology without hydrocephalus or trapping. Basilar cisterns remain patent. No new intracranial hemorrhage or large vessel territory infarct. No extra-axial collection. Underlying atrophy with chronic microvascular ischemic disease again noted. Small remote right cerebellar infarcts noted as well. Vascular: No hyperdense vessel. Scattered vascular calcifications noted within the carotid  siphons. Skull: Scalp soft tissues and calvarium demonstrate no acute finding. Sinuses/Orbits:  Globes and orbital soft tissues demonstrate no acute finding. Mild scattered mucosal thickening noted within the ethmoidal air cells and maxillary sinuses. Paranasal sinuses are otherwise clear. Mastoid air cells remain largely clear. Other: None. IMPRESSION: 1. No significant interval change in size and morphology of left thalamic hemorrhage. Surrounding edema has mildly increased from prior, although regional mass effect and localized left-to-right shift is not significantly changed. 2. Intraventricular extension with blood within the lateral and third ventricles, stable. Stable ventricular size and morphology without hydrocephalus or trapping. 3. No other new acute intracranial abnormality. Electronically Signed   By: Jeannine Boga M.D.   On: 01/25/2021 04:19   CT HEAD WO CONTRAST  Result Date: 01/23/2021 CLINICAL DATA:  Follow-up intracranial hemorrhage EXAM: CT HEAD WITHOUT CONTRAST TECHNIQUE: Contiguous axial images were obtained from the base of the skull through the vertex without intravenous contrast. COMPARISON:  CT from earlier in the same day. FINDINGS: Brain: Parenchymal hemorrhage is again noted in the region of the left thalamus. Some surrounding edema is noted. The hematoma measures approximately 3.0 x 2.4 cm and roughly stable from the prior exam. Some intraventricular component is again noted particularly in the left lateral ventricle. Chronic white matter ischemic changes are noted. No new focal hemorrhage is seen. Chronic right cerebellar infarct is noted. Vascular: No hyperdense vessel or unexpected calcification. Skull: Normal. Negative for fracture or focal lesion. Sinuses/Orbits: No acute finding. Other: None. IMPRESSION: Stable appearing left thalamic hemorrhage with intraventricular component. No new focal abnormality is noted. Electronically Signed   By: Inez Catalina M.D.   On: 01/23/2021 21:54   MR MRA HEAD WO CONTRAST  Result Date: 01/23/2021 CLINICAL DATA:  Initial  evaluation for acute intracranial hemorrhage, headache. EXAM: MRI HEAD WITHOUT CONTRAST MRA HEAD WITHOUT CONTRAST TECHNIQUE: Multiplanar, multi-echo pulse sequences of the brain and surrounding structures were acquired without intravenous contrast. Angiographic images of the Circle of Willis were acquired using MRA technique without intravenous contrast. COMPARISON: No pertinent prior exam. COMPARISON:  Prior CT from earlier the same day as well as previous MRI from 06/23/2018. FINDINGS: MRI HEAD FINDINGS Brain: Examination moderately to severely degraded by motion artifact. Generalized age-related cerebral atrophy. Patchy and confluent T2/FLAIR hyperintensity within the periventricular and deep white matter both cerebral hemispheres most consistent with chronic small vessel ischemic disease. Few small remote right cerebellar infarcts noted. Previously identified acute intraparenchymal hemorrhage centered at the left thalamus again seen. Bleed measures 3.0 x 3.1 x 3.4 cm on this motion degraded exam (estimated volume 16 cc). No visible underlying mass lesion or other structure abnormality on this motion degraded noncontrast examination. Surrounding vasogenic edema with mild regional mass effect. Associated mild localized left-to-right shift measures up to 5 mm at the septum pellucidum. Associated intraventricular extension with blood within the left greater than right lateral ventricles. No hydrocephalus or ventricular trapping at this time. No other evidence for acute intracranial hemorrhage. No other foci of susceptibility artifact to suggest chronic hemorrhage elsewhere within the brain. No other foci of restricted diffusion to suggest acute or subacute ischemia. Gray-white matter differentiation otherwise maintained. No mass lesion or mass effect elsewhere within the brain. No extra-axial fluid collection. Pituitary gland suprasellar region within normal limits. Vascular: Major intracranial vascular flow voids  are maintained. Skull and upper cervical spine: Craniocervical junction within normal limits. Bone marrow signal intensity normal. No scalp soft tissue abnormality. Sinuses/Orbits: Patient status post ocular lens replacement on the  right. Globes and orbital soft tissues demonstrate no acute finding. Paranasal sinuses are largely clear. Trace bilateral mastoid effusions, of doubtful significance. Inner ear structures grossly normal. Other: None. MRA HEAD FINDINGS ANTERIOR CIRCULATION: Examination degraded by motion artifact. Both internal carotid arteries patent to the termini without stenosis or other appreciable abnormality. A1 segments patent. Anterior communicating artery complex grossly within normal limits. Anterior cerebral arteries grossly patent to their distal aspects without appreciable stenosis. No obvious M1 stenosis. Grossly normal MCA bifurcations. Distal MCA branches well perfused and symmetric. POSTERIOR CIRCULATION: Both V4 segments patent to the vertebrobasilar junction without stenosis. Left vertebral artery dominant. Left PICA origin patent and normal. Right PICA origin not definitely seen. Basilar patent to its distal aspect without stenosis. Superior cerebellar arteries patent bilaterally. Both PCAs primarily supplied via the basilar. PCAs perfused to their distal aspects without appreciable stenosis. No intracranial aneurysm. No vascular abnormality seen underlying the acute left thalamic hemorrhage. IMPRESSION: MRI HEAD IMPRESSION: 1. Motion degraded exam. 2. 3.0 x 3.1 x 3.4 cm acute intraparenchymal hemorrhage centered at the left thalamus. Surrounding vasogenic edema with mild regional mass effect and 5 mm of left-to-right shift. No visible underlying mass lesion or other structure abnormality on this motion degraded exam. 3. Associated intraventricular extension with blood within the left greater than right lateral ventricles. No hydrocephalus or ventricular trapping at this time. 4.  Underlying age-related cerebral atrophy with mild chronic small vessel ischemic disease, with a few small remote right cerebellar infarcts. MRA HEAD IMPRESSION: 1. Technically limited exam due to extensive motion artifact. 2. Grossly negative intracranial MRA. No large vessel occlusion, hemodynamically significant stenosis, or other acute vascular abnormality. No vascular abnormality seen underlying the acute left thalamic hemorrhage. Please note that while a MRA neck was also ordered for this exam, this was unable to be performed due to the patient's inability to tolerate the full length of the study and extensive motion artifact. Electronically Signed   By: Jeannine Boga M.D.   On: 01/23/2021 21:41   MR ANGIO NECK W WO CONTRAST  Result Date: 01/23/2021 CLINICAL DATA:  Initial evaluation for acute intracranial hemorrhage, headache. EXAM: MRI HEAD WITHOUT CONTRAST MRA HEAD WITHOUT CONTRAST TECHNIQUE: Multiplanar, multi-echo pulse sequences of the brain and surrounding structures were acquired without intravenous contrast. Angiographic images of the Circle of Willis were acquired using MRA technique without intravenous contrast. COMPARISON: No pertinent prior exam. COMPARISON:  Prior CT from earlier the same day as well as previous MRI from 06/23/2018. FINDINGS: MRI HEAD FINDINGS Brain: Examination moderately to severely degraded by motion artifact. Generalized age-related cerebral atrophy. Patchy and confluent T2/FLAIR hyperintensity within the periventricular and deep white matter both cerebral hemispheres most consistent with chronic small vessel ischemic disease. Few small remote right cerebellar infarcts noted. Previously identified acute intraparenchymal hemorrhage centered at the left thalamus again seen. Bleed measures 3.0 x 3.1 x 3.4 cm on this motion degraded exam (estimated volume 16 cc). No visible underlying mass lesion or other structure abnormality on this motion degraded noncontrast  examination. Surrounding vasogenic edema with mild regional mass effect. Associated mild localized left-to-right shift measures up to 5 mm at the septum pellucidum. Associated intraventricular extension with blood within the left greater than right lateral ventricles. No hydrocephalus or ventricular trapping at this time. No other evidence for acute intracranial hemorrhage. No other foci of susceptibility artifact to suggest chronic hemorrhage elsewhere within the brain. No other foci of restricted diffusion to suggest acute or subacute ischemia. Gray-white matter differentiation otherwise maintained.  No mass lesion or mass effect elsewhere within the brain. No extra-axial fluid collection. Pituitary gland suprasellar region within normal limits. Vascular: Major intracranial vascular flow voids are maintained. Skull and upper cervical spine: Craniocervical junction within normal limits. Bone marrow signal intensity normal. No scalp soft tissue abnormality. Sinuses/Orbits: Patient status post ocular lens replacement on the right. Globes and orbital soft tissues demonstrate no acute finding. Paranasal sinuses are largely clear. Trace bilateral mastoid effusions, of doubtful significance. Inner ear structures grossly normal. Other: None. MRA HEAD FINDINGS ANTERIOR CIRCULATION: Examination degraded by motion artifact. Both internal carotid arteries patent to the termini without stenosis or other appreciable abnormality. A1 segments patent. Anterior communicating artery complex grossly within normal limits. Anterior cerebral arteries grossly patent to their distal aspects without appreciable stenosis. No obvious M1 stenosis. Grossly normal MCA bifurcations. Distal MCA branches well perfused and symmetric. POSTERIOR CIRCULATION: Both V4 segments patent to the vertebrobasilar junction without stenosis. Left vertebral artery dominant. Left PICA origin patent and normal. Right PICA origin not definitely seen. Basilar patent  to its distal aspect without stenosis. Superior cerebellar arteries patent bilaterally. Both PCAs primarily supplied via the basilar. PCAs perfused to their distal aspects without appreciable stenosis. No intracranial aneurysm. No vascular abnormality seen underlying the acute left thalamic hemorrhage. IMPRESSION: MRI HEAD IMPRESSION: 1. Motion degraded exam. 2. 3.0 x 3.1 x 3.4 cm acute intraparenchymal hemorrhage centered at the left thalamus. Surrounding vasogenic edema with mild regional mass effect and 5 mm of left-to-right shift. No visible underlying mass lesion or other structure abnormality on this motion degraded exam. 3. Associated intraventricular extension with blood within the left greater than right lateral ventricles. No hydrocephalus or ventricular trapping at this time. 4. Underlying age-related cerebral atrophy with mild chronic small vessel ischemic disease, with a few small remote right cerebellar infarcts. MRA HEAD IMPRESSION: 1. Technically limited exam due to extensive motion artifact. 2. Grossly negative intracranial MRA. No large vessel occlusion, hemodynamically significant stenosis, or other acute vascular abnormality. No vascular abnormality seen underlying the acute left thalamic hemorrhage. Please note that while a MRA neck was also ordered for this exam, this was unable to be performed due to the patient's inability to tolerate the full length of the study and extensive motion artifact. Electronically Signed   By: Jeannine Boga M.D.   On: 01/23/2021 21:41   MR BRAIN WO CONTRAST  Result Date: 01/23/2021 CLINICAL DATA:  Initial evaluation for acute intracranial hemorrhage, headache. EXAM: MRI HEAD WITHOUT CONTRAST MRA HEAD WITHOUT CONTRAST TECHNIQUE: Multiplanar, multi-echo pulse sequences of the brain and surrounding structures were acquired without intravenous contrast. Angiographic images of the Circle of Willis were acquired using MRA technique without intravenous contrast.  COMPARISON: No pertinent prior exam. COMPARISON:  Prior CT from earlier the same day as well as previous MRI from 06/23/2018. FINDINGS: MRI HEAD FINDINGS Brain: Examination moderately to severely degraded by motion artifact. Generalized age-related cerebral atrophy. Patchy and confluent T2/FLAIR hyperintensity within the periventricular and deep white matter both cerebral hemispheres most consistent with chronic small vessel ischemic disease. Few small remote right cerebellar infarcts noted. Previously identified acute intraparenchymal hemorrhage centered at the left thalamus again seen. Bleed measures 3.0 x 3.1 x 3.4 cm on this motion degraded exam (estimated volume 16 cc). No visible underlying mass lesion or other structure abnormality on this motion degraded noncontrast examination. Surrounding vasogenic edema with mild regional mass effect. Associated mild localized left-to-right shift measures up to 5 mm at the septum pellucidum. Associated intraventricular extension  with blood within the left greater than right lateral ventricles. No hydrocephalus or ventricular trapping at this time. No other evidence for acute intracranial hemorrhage. No other foci of susceptibility artifact to suggest chronic hemorrhage elsewhere within the brain. No other foci of restricted diffusion to suggest acute or subacute ischemia. Gray-white matter differentiation otherwise maintained. No mass lesion or mass effect elsewhere within the brain. No extra-axial fluid collection. Pituitary gland suprasellar region within normal limits. Vascular: Major intracranial vascular flow voids are maintained. Skull and upper cervical spine: Craniocervical junction within normal limits. Bone marrow signal intensity normal. No scalp soft tissue abnormality. Sinuses/Orbits: Patient status post ocular lens replacement on the right. Globes and orbital soft tissues demonstrate no acute finding. Paranasal sinuses are largely clear. Trace bilateral  mastoid effusions, of doubtful significance. Inner ear structures grossly normal. Other: None. MRA HEAD FINDINGS ANTERIOR CIRCULATION: Examination degraded by motion artifact. Both internal carotid arteries patent to the termini without stenosis or other appreciable abnormality. A1 segments patent. Anterior communicating artery complex grossly within normal limits. Anterior cerebral arteries grossly patent to their distal aspects without appreciable stenosis. No obvious M1 stenosis. Grossly normal MCA bifurcations. Distal MCA branches well perfused and symmetric. POSTERIOR CIRCULATION: Both V4 segments patent to the vertebrobasilar junction without stenosis. Left vertebral artery dominant. Left PICA origin patent and normal. Right PICA origin not definitely seen. Basilar patent to its distal aspect without stenosis. Superior cerebellar arteries patent bilaterally. Both PCAs primarily supplied via the basilar. PCAs perfused to their distal aspects without appreciable stenosis. No intracranial aneurysm. No vascular abnormality seen underlying the acute left thalamic hemorrhage. IMPRESSION: MRI HEAD IMPRESSION: 1. Motion degraded exam. 2. 3.0 x 3.1 x 3.4 cm acute intraparenchymal hemorrhage centered at the left thalamus. Surrounding vasogenic edema with mild regional mass effect and 5 mm of left-to-right shift. No visible underlying mass lesion or other structure abnormality on this motion degraded exam. 3. Associated intraventricular extension with blood within the left greater than right lateral ventricles. No hydrocephalus or ventricular trapping at this time. 4. Underlying age-related cerebral atrophy with mild chronic small vessel ischemic disease, with a few small remote right cerebellar infarcts. MRA HEAD IMPRESSION: 1. Technically limited exam due to extensive motion artifact. 2. Grossly negative intracranial MRA. No large vessel occlusion, hemodynamically significant stenosis, or other acute vascular  abnormality. No vascular abnormality seen underlying the acute left thalamic hemorrhage. Please note that while a MRA neck was also ordered for this exam, this was unable to be performed due to the patient's inability to tolerate the full length of the study and extensive motion artifact. Electronically Signed   By: Jeannine Boga M.D.   On: 01/23/2021 21:41   DG CHEST PORT 1 VIEW  Result Date: 01/30/2021 CLINICAL DATA:  Leukocytosis. EXAM: PORTABLE CHEST 1 VIEW COMPARISON:  Jan 27, 2021. FINDINGS: The heart size and mediastinal contours are within normal limits. Distal tip of feeding tube is seen in proximal stomach. Both lungs are clear. The visualized skeletal structures are unremarkable. IMPRESSION: No active disease. Electronically Signed   By: Marijo Conception M.D.   On: 01/30/2021 11:38   DG Chest Port 1 View  Result Date: 01/27/2021 CLINICAL DATA:  Code stroke EXAM: PORTABLE CHEST 1 VIEW COMPARISON:  01/26/2021 FINDINGS: Esophageal tube tip coiled within the proximal stomach. No focal opacity or pleural effusion. Stable cardiomediastinal silhouette with borderline cardiomegaly. No pneumothorax. IMPRESSION: No active disease. Electronically Signed   By: Donavan Foil M.D.   On: 01/27/2021 18:38  DG Chest Port 1 View  Result Date: 01/26/2021 CLINICAL DATA:  Fever, code stroke EXAM: PORTABLE CHEST 1 VIEW COMPARISON:  Radiograph 08/13/2011 FINDINGS: Unchanged cardiomediastinal silhouette. There is no focal airspace disease. There is no pleural effusion or visible pneumothorax. There is no acute osseous abnormality. There is a at weighted tip enteric tube overlying the stomach. IMPRESSION: No evidence of acute cardiopulmonary disease. Electronically Signed   By: Maurine Simmering   On: 01/26/2021 10:06   EEG adult  Result Date: 01/27/2021 Lora Havens, MD     01/27/2021 11:40 AM Patient Name: Brian [name] MRN: [id number] Epilepsy Attending: Lora Havens Referring Physician/Provider: Dr.  Antony Contras Date: 01/27/2021 Duration: 23.10 minutes Patient history: 85 year old male with left thalamic hemorrhage.  EEG to evaluate for seizures. Level of alertness: Awake AEDs during EEG study: Gabapentin Technical aspects: This EEG study was done with scalp electrodes positioned according to the 10-20 International system of electrode placement. Electrical activity was acquired at a sampling rate of 500Hz  and reviewed with a high frequency filter of 70Hz  and a low frequency filter of 1Hz . EEG data were recorded continuously and digitally stored. Description: The posterior dominant rhythm consists of 8 Hz activity of moderate voltage (25-35 uV) seen predominantly in posterior head regions, symmetric and reactive to eye opening and eye closing.  EEG showed continuous rhythmic sharply contoured 3 to 5 Hz theta- delta slowing in left frontotemporal region hyperventilation and photic stimulation were not performed.   ABNORMALITY -Continuous slow, left frontotemporal region IMPRESSION: This study is suggestive of cortical dysfunction in left frontotemporal region likely secondary to underlying bleed. No seizures or definite epileptiform discharges were seen throughout the recording. Lora Havens   ECHOCARDIOGRAM COMPLETE  Result Date: 01/24/2021    ECHOCARDIOGRAM REPORT   Patient Name:   Brian [name] Date of Exam: 01/24/2021 Medical Rec #:  [id number]     Height:       72.0 in Accession #:    [id number]    Weight:       215.6 lb Date of Birth:  02/18/1936     BSA:          2.200 m Patient Age:    74 years      BP:           143/92 mmHg Patient Gender: M             HR:           77 bpm. Exam Location:  Inpatient Procedure: 2D Echo, Cardiac Doppler and Color Doppler Indications:    Stroke I63.9  History:        Patient has prior history of Echocardiogram examinations, most                 recent 04/02/2018. Stroke, Signs/Symptoms:Chest Pain; Risk                 Factors:Hypertension, Diabetes and Dyslipidemia.   Sonographer:    Tiffany Dance Referring Phys: [id number] [place]  Sonographer Comments: Suboptimal subcostal window. IMPRESSIONS  1. Left ventricular ejection fraction, by estimation, is 55 to 60%. The left ventricle has normal function. The left ventricle has no regional wall motion abnormalities. Left ventricular diastolic parameters are consistent with Grade I diastolic dysfunction (impaired relaxation).  2. Right ventricular systolic function is normal. The right ventricular size is normal.  3. Left atrial size was mildly dilated.  4. The mitral valve is normal in structure. No evidence of mitral  valve regurgitation. No evidence of mitral stenosis.  5. The aortic valve is normal in structure. Aortic valve regurgitation is not visualized. Mild aortic valve sclerosis is present, with no evidence of aortic valve stenosis.  6. The inferior vena cava is normal in size with greater than 50% respiratory variability, suggesting right atrial pressure of 3 mmHg. Comparison(s): Prior images unable to be directly viewed, comparison made by report only. Conclusion(s)/Recommendation(s): No intracardiac source of embolism detected on this transthoracic study. Consider saline contrast study or TEE to evaluate the atrial septal aneurysm for evidence of PFO/shunting. FINDINGS  Left Ventricle: Left ventricular ejection fraction, by estimation, is 55 to 60%. The left ventricle has normal function. The left ventricle has no regional wall motion abnormalities. The left ventricular internal cavity size was normal in size. There is  borderline concentric left ventricular hypertrophy. Left ventricular diastolic parameters are consistent with Grade I diastolic dysfunction (impaired relaxation). Indeterminate filling pressures. Right Ventricle: The right ventricular size is normal. No increase in right ventricular wall thickness. Right ventricular systolic function is normal. Left Atrium: Left atrial size was mildly dilated. Right  Atrium: Right atrial size was normal in size. Pericardium: There is no evidence of pericardial effusion. Mitral Valve: The mitral valve is normal in structure. Mild mitral annular calcification. No evidence of mitral valve regurgitation. No evidence of mitral valve stenosis. Tricuspid Valve: The tricuspid valve is normal in structure. Tricuspid valve regurgitation is not demonstrated. No evidence of tricuspid stenosis. Aortic Valve: The aortic valve is normal in structure. Aortic valve regurgitation is not visualized. Mild aortic valve sclerosis is present, with no evidence of aortic valve stenosis. Pulmonic Valve: The pulmonic valve was normal in structure. Pulmonic valve regurgitation is not visualized. No evidence of pulmonic stenosis. Aorta: The aortic root is normal in size and structure. Venous: The inferior vena cava is normal in size with greater than 50% respiratory variability, suggesting right atrial pressure of 3 mmHg. IAS/Shunts: The interatrial septum is aneurysmal. No atrial level shunt detected by color flow Doppler.  LEFT VENTRICLE PLAX 2D LVIDd:         4.00 cm     Diastology LVIDs:         3.00 cm     LV e' medial:    3.70 cm/s LV PW:         1.30 cm     LV E/e' medial:  14.9 LV IVS:        1.20 cm     LV e' lateral:   6.64 cm/s LVOT diam:     2.40 cm     LV E/e' lateral: 8.3 LV SV:         80 LV SV Index:   36 LVOT Area:     4.52 cm  LV Volumes (MOD) LV vol d, MOD A2C: 73.3 ml LV vol d, MOD A4C: 82.0 ml LV vol s, MOD A2C: 28.7 ml LV vol s, MOD A4C: 35.1 ml LV SV MOD A2C:     44.6 ml LV SV MOD A4C:     82.0 ml LV SV MOD BP:      46.3 ml RIGHT VENTRICLE             IVC RV Basal diam:  1.80 cm     IVC diam: 1.60 cm RV S prime:     13.50 cm/s TAPSE (M-mode): 2.3 cm LEFT ATRIUM             Index  RIGHT ATRIUM           Index LA diam:        4.00 cm 1.82 cm/m  RA Area:     12.10 cm LA Vol (A2C):   74.9 ml 34.05 ml/m RA Volume:   22.20 ml  10.09 ml/m LA Vol (A4C):   32.2 ml 14.64 ml/m LA  Biplane Vol: 50.7 ml 23.05 ml/m  AORTIC VALVE LVOT Vmax:   94.20 cm/s LVOT Vmean:  64.400 cm/s LVOT VTI:    0.176 m  AORTA Ao Root diam: 3.80 cm Ao Asc diam:  3.80 cm MITRAL VALVE MV Area (PHT): 3.12 cm    SHUNTS MV Decel Time: 243 msec    Systemic VTI:  0.18 m MV E velocity: 55.30 cm/s  Systemic Diam: 2.40 cm MV A velocity: 97.30 cm/s MV E/A ratio:  0.57 Mihai Croitoru MD Electronically signed by Sanda Klein MD Signature Date/Time: 01/24/2021/4:17:34 PM    Final    CT HEAD CODE STROKE WO CONTRAST  Result Date: 01/23/2021 CLINICAL DATA:  Code stroke.  Left-sided facial droop EXAM: CT HEAD WITHOUT CONTRAST TECHNIQUE: Contiguous axial images were obtained from the base of the skull through the vertex without intravenous contrast. COMPARISON:  2019 FINDINGS: Brain: There is acute hemorrhage centered within the left thalamus measuring 2.6 x 2.8 x 3 cm. Surrounding edema is present. There is partial effacement of the third ventricle. Intraventricular extension is present with hemorrhage seen within the left lateral and third ventricles. Gray-white differentiation is preserved. Patchy and confluent areas of hypoattenuation in the supratentorial white matter are nonspecific but probably reflect moderate chronic microvascular ischemic changes. Small chronic right cerebellar infarct. Prominence of the ventricles and sulci reflects generalized parenchymal volume loss. Vascular: There is intracranial atherosclerotic calcification at the skull base. Skull: Unremarkable. Sinuses/Orbits: Paranasal sinus mucosal thickening. No acute orbital abnormality. Other: Mastoid air cells are clear. IMPRESSION: Acute left thalamic hemorrhage with mild regional mass effect and intraventricular extension. No hydrocephalus. Chronic microvascular ischemic changes. Small chronic right cerebellar infarct. Initial results were communicated to Dr. Lorrin Goodell at 3:42 pm on 01/23/2021 by text page via the [facility] messaging system. Electronically  Signed   By: Macy Mis M.D.   On: 01/23/2021 15:45   VAS US CAROTID  Result Date: 02/01/2021 Carotid Arterial Duplex Study Patient Name:  Brian [name]  Date of Exam:   01/30/2021 Medical Rec #: [id number]      Accession #:    [id number] Date of Birth: Feb 01, 1936      Patient Gender: M Patient Age:   85Y Exam Location:  [facility] Procedure:      VAS US CAROTID Referring Phys: [id number] Rosalin Hawking --------------------------------------------------------------------------------  Indications:       CVA. Risk Factors:      Hypertension, hyperlipidemia, Diabetes, prior CVA. Comparison Study:  no prior Performing Technologist: Abram Sander RVS  Examination Guidelines: A complete evaluation includes B-mode imaging, spectral Doppler, color Doppler, and power Doppler as needed of all accessible portions of each vessel. Bilateral testing is considered an integral part of a complete examination. Limited examinations for reoccurring indications may be performed as noted.  Right Carotid Findings: +----------+--------+--------+--------+------------------+--------+           PSV cm/sEDV cm/sStenosisPlaque DescriptionComments +----------+--------+--------+--------+------------------+--------+ CCA Prox  92      10              heterogenous               +----------+--------+--------+--------+------------------+--------+ CCA  Distal80      16              heterogenous               +----------+--------+--------+--------+------------------+--------+ ICA Prox  59      15      1-39%   heterogenous               +----------+--------+--------+--------+------------------+--------+ ICA Distal86      19                                         +----------+--------+--------+--------+------------------+--------+ ECA       99                                                 +----------+--------+--------+--------+------------------+--------+  +----------+--------+-------+--------+-------------------+           PSV cm/sEDV cmsDescribeArm Pressure (mmHG) +----------+--------+-------+--------+-------------------+ Subclavian109                                        +----------+--------+-------+--------+-------------------+ +---------+--------+--+--------+--+---------+ VertebralPSV cm/s58EDV cm/s12Antegrade +---------+--------+--+--------+--+---------+  Left Carotid Findings: +----------+--------+--------+--------+------------------+--------+           PSV cm/sEDV cm/sStenosisPlaque DescriptionComments +----------+--------+--------+--------+------------------+--------+ CCA Prox  98      11              heterogenous               +----------+--------+--------+--------+------------------+--------+ CCA Distal64      12              heterogenous               +----------+--------+--------+--------+------------------+--------+ ICA Prox  55      13      1-39%   heterogenous               +----------+--------+--------+--------+------------------+--------+ ICA Distal62      13                                         +----------+--------+--------+--------+------------------+--------+ ECA       81                                                 +----------+--------+--------+--------+------------------+--------+ +----------+--------+--------+--------+-------------------+           PSV cm/sEDV cm/sDescribeArm Pressure (mmHG) +----------+--------+--------+--------+-------------------+ [id number]                                          +----------+--------+--------+--------+-------------------+ +---------+--------+--+--------+--+---------+ VertebralPSV cm/s53EDV cm/s10Antegrade +---------+--------+--+--------+--+---------+   Summary: Right Carotid: Velocities in the right ICA are consistent with a 1-39% stenosis. Left Carotid: Velocities in the left ICA are consistent with a 1-39% stenosis.  Vertebrals: Bilateral vertebral arteries demonstrate antegrade flow. *See table(s) above for measurements and observations.  Electronically signed by Antony Contras MD on 02/01/2021 at 8:15:25 AM.  Final     PHYSICAL EXAM  Temp:  [98.5 F (36.9 C)-99 F (37.2 C)] 98.7 F (37.1 C) (05/18 0800) Pulse Rate:  [59-79] 67 (05/18 1100) Resp:  [10-21] 17 (05/18 1100) BP: (126-163)/(54-106) 138/54 (05/18 1100) SpO2:  [93 %-99 %] 95 % (05/18 1100) Weight:  [100 kg] 100 kg (05/18 0355)  General - Well nourished, well developed, in no apparent distress.  Ophthalmologic - fundi not visualized due to noncooperation.  Cardiovascular - Regular rhythm and rate, not in afib now.  Neuro - awake, alert, eyes open, global aphasia except able to close eyes and lift left leg on command but not other commands. Not able to name and repeat. Left gaze preference but able to cross midline with right gaze, tracking bilaterally, still not blinking to visual threat on the right, PERRL. Right facial droop. Tongue protrusion not cooperative. LUE able to hold against gravity. LLE able to briefly lift up against gravity with spontaneous movement. RUE and RLE 0/5, RUE increased muscle tone. Sensation, coordination and gait not tested.    ASSESSMENT/PLAN Azari K Flowersis a 85 y.o.malewith history of A. fib on Eliquis, hypertension, hyperlipidemia, diabetes admitted for L Thalamic ICH withIVH and an ICH score of 2. He is s/p Andexanet alpha for presumed ICH on Eliquis.  ICH -left thalamic ICH and IVH with associated cerebral edema in the setting hypertensive emergency and apixaban therapy for PAF  01/23/2021 head CT acute left thalamic ICH and intraventricular extension, no hydrocephalus  Repeat CT Head 5/11 and 5/13 - stable IPH, edema and mass effect 40mm MLS.   MRI head: 3.0 x 3.1 x 3.4 cm acute intraparenchymal hemorrhage centered at the left thalamus. Mild regional mass effect and 5 mm of left-to-right shift.  MRA  head: Grossly negative, No LVO  CT repeat 5/15 increased size of 3rd ventricle and frontal and temproal horn  CT repeat 5/16 decreased size of 3rd ventricle and frontal and temporal horn   Repeat CT 5/18 stable ventricle size, no hydrocephalus, left thalamic ICH evolving  CUS unremarkable  2D Echo: EF 55-60%  VTE prophylaxis -Lovenox  On Eliquis for known PAF prior to admission, no anticoagulant or antiplatelets for now given ICH.   Therapy recommendations:  SNF  Disposition:  TBD  Cerebral edema  CT repeat showed stable ICH and IVH with 52mm MLS  on 3% saline -> tapered off-> on FW and TF  Na 138-142-147->153->150  Allow Na gradually trending down  CT repeat 5/15 increased size of 3rd ventricle and frontal and temproal horn  CT repeat 5/16 decreased size of 3rd ventricle and frontal and temporal horn  Repeat CT 5/18 stable ventricle size, no hydrocephalus, left thalamic ICH evolving  PAF SVT x 3  Home medication: Eliquis, reversed with Andexxa  Followed by Dr. Harrington Challenger  2019 Echo showed EF 45-50%  now with improved EF 55-60%  On amiodarone IV -> po 400mg  bid with titration dosing per pharmacy  On coreg 12.5 bid -> 25 bid  No antithrombotic now  Cardiology on board, appreciate help  Hypertension  Home meds: Norvasc 10, Coreg 12.5, Cozaar 100mg    Off Cleviprex  On Coreg 12.5 -> 25  amlodipine 10 and losartan 50 bid  BP goal < 160  Long term goal normotensive  Hyperlipidemia  Home meds:  Lipitor 80mg   LDL 55, goal < 70  Hold statin in setting of current ICH    Consider to resume statin at discharge  DM2, controlled  Home meds are glargine  14 u hs, metformin 1000 BID, Januvia 100 daily  HgbA1c 5.8, goal < 7.0  Hypoglycemia after correction dose - DM coordinator on board -> recommend to decreased SSI dosing  CBGs  SSI  Dysphagia Feeding  Failed swallow eval 5/11, NPO now  Cortrack tube placed  Jevity @ 60cc/hr, add free  water  Speech to follow  May need PEG if going to SNF  AKI, resolved  Cre 1.22->1.39->1.11->0.98->0.90->0.95->1.08->1.09  Setting of severe hypertension  Avoid nephrotoxins  On tube feeding and IV fluid, add FW  Fever Leukocytosis, improved Copious secretion  Tmax 101.7-> afebrile  Monitor fever curve  CXR: no acute findings  CBC with leukocytosis wbc 13-> 8.8->6.8->8.7->9.5->11.3  Urine Culture negative  Chest xray neg  On unasyn    Other Stroke Risk Factors  Advanced Age >/= 47   Former Cigarette smoker quit 1995.   Current ETOH use, advised to drink no more than 2 drink(s) a day  Hx stroke/TIA  Other Active Problems  Constipation - on senokot, will give dulcolax suppository   Hospital day # 9   Rosalin Hawking, MD PhD Stroke Neurology 02/01/2021 12:11 PM    To contact Stroke Continuity provider, please refer to [email or web address] After hours, contact General Neurology

## 2021-02-02 ENCOUNTER — Inpatient Hospital Stay (HOSPITAL_COMMUNITY): Payer: Medicare HMO

## 2021-02-02 DIAGNOSIS — I610 Nontraumatic intracerebral hemorrhage in hemisphere, subcortical: Secondary | ICD-10-CM | POA: Diagnosis not present

## 2021-02-02 DIAGNOSIS — I615 Nontraumatic intracerebral hemorrhage, intraventricular: Secondary | ICD-10-CM | POA: Diagnosis not present

## 2021-02-02 DIAGNOSIS — I10 Essential (primary) hypertension: Secondary | ICD-10-CM | POA: Diagnosis not present

## 2021-02-02 LAB — GLUCOSE, CAPILLARY
Glucose-Capillary: 170 mg/dL — ABNORMAL HIGH (ref 70–99)
Glucose-Capillary: 98 mg/dL (ref 70–99)
Glucose-Capillary: 159 mg/dL — ABNORMAL HIGH (ref 70–99)
Glucose-Capillary: 136 mg/dL — ABNORMAL HIGH (ref 70–99)
Glucose-Capillary: 120 mg/dL — ABNORMAL HIGH (ref 70–99)

## 2021-02-02 NOTE — Care Management Important Message (Signed)
Important Message  Patient Details  Name: Brian [name] MRN: [id number] Date of Birth: [birth year]   Medicare Important Message Given:  Yes     Sabrena Gavitt [address] 02/02/2021, 2:35 PM

## 2021-02-02 NOTE — Progress Notes (Signed)
PROGRESS NOTE    Brian [name]  YQI:[id number] DOB: 1936/03/05 DOA: 01/23/2021 PCP: [name], MD   Brief Narrative:  Patient is 85 yo male admitted to [facility] on 01/23/2021 for acute onset right sided weakness and language deficit. Pertinent PMH of HTN, HLD, EF 45-50% on 2019, T2DM, Chronic anemia, Afib on Eliquis at home, prior L MCa Stroke. CT head w/o contrast and MRI on 5/9 showing L thalamic intracranial hemorrhage with cytoxic edema/with mild 52mm left to right shift. Patient was admitted with known Afib on anticoagulation with Apixaban which was reversed in ED with Andexanet Alpha. Also requiring Cleviprex infusion for hypertensive emergency. Admitted to Neuro ICU under the stroke service. Course complicated with ongoing HTN requiring Cleviprex infusion. Mentation initially somewhat improving. Patient developed fever with increased lethargy on 5/12. Failed swallow eval and core-track was placed. Cleviprex stopped on 5/12 and started on oral anti hypertensives. On 5/13 low grade fever persist with downtrending WBC. EEG shows focal left-sided slowing but no focal seizures.  Repeat imaging shows what appears to be evolving intracranial hemorrhaging per neurology with poor prognosis.  Transitioning out of the ICU, hospitalist called to take over care on 02/01/2021.  Assessment & Plan:   Active Problems:   Hypertensive crisis   ICH (intracerebral hemorrhage) (HCC)   SVT (supraventricular tachycardia) (HCC)   ICH previously on eliquis; s/p andexant alpha, POA History of ischemic stroke - Left thalamic ICH with 7 mm midline shift - Neurology following, appreciate insight and recommendations -repeat imaging shows evolving intracranial hemorrhage on the 15th 16th and 18 of May - Induced hypernatremia in the setting of cerebral edema, now downtrending - stopped eliquis; reversed at admit - Statin ongoing  Hypertensive emergency, remains very resistant despite increased regimen - Stroke team is  following - SBP goal less than 160 - Continue coreg, losartan, amlodipine; PRN labetalol and hydralazine - Cardiology following - Neuroprotective measures as above - Aspiration precautions  Sustained atrial tachycardia - developed 2 episodes on 5/16 of SVT - Cardiology following, appreciate insights/recs - on carvedilol, amiodarone with as needed labetalol  History of Atrial Fibrillation - Chronically anticoagulated on Eliquis, this was reversed on arrival  - Holding anticoagulation due to [place] - Follow rate control as above  Possible aspiration pneumonia, resolving - Complete 7 days of unasyn -Afebrile for 24 hours - CXR (5/16)- clear lungs bilaterally - Continue tube feeds via cortrak - Aspiration precautions, NTS PRN  Diabetes type 2 -Increase Levemir 20 twice daily, discontinue Premeal coverage given he is on continuous tube feeds -Continue every 6 hour Accu-Chek; continue hypoglycemic protocol -CBG goal 140-180  Dysphagia secondary to mental status and stroke - Continue tube feeds via core track - Aspiration precautions - SLP following - NTS PRN  Acute anemia likely due to critical illness (normocytic, hypochromic) -transfuse for Hb <7 or hemodynamically significant bleeding    DVT prophylaxis: None given intracranial hemorrhage as above Code Status: Full Family Communication: None present  Status is: Inpatient  Dispo: The patient is from: Home              Anticipated d/c is to: TBD pending clinical course              Anticipated d/c date is: >72h              Patient currently not medically stable for discharge  Consultants:   Cards, Neuro, PCCM  Procedures:   None  Antimicrobials:  Completed Unasyn as above  Subjective: No  acute issues or events overnight patient's review of systems markedly limited given mental status  Objective: Vitals:   02/01/21 2333 02/02/21 0030 02/02/21 0350 02/02/21 0431  BP: (!) 160/62 (!) 136/52 138/70    Pulse: 78 65 69   Resp: 19  18   Temp: 98.8 F (37.1 C)  99.1 F (37.3 C)   TempSrc: Oral  Oral   SpO2: 96% 94% 97%   Weight:    107.4 kg  Height:        Intake/Output Summary (Last 24 hours) at 02/02/2021 0718 Last data filed at 02/02/2021 0645 Gross per 24 hour  Intake 1791.86 ml  Output 1450 ml  Net 341.86 ml   Filed Weights   01/31/21 0500 02/01/21 0355 02/02/21 0431  Weight: 99.9 kg 100 kg 107.4 kg    Examination:  General:  Pleasantly resting in bed, No acute distress.  Poorly interactive HEENT: Pinpoint nonreactive pupils, minimal reflex, core track in place Neck:  Without mass or deformity.  Trachea is midline. Lungs: Coarse breath sounds bilaterally without overt wheeze or rales Heart:  Regular rate and rhythm.  Without murmurs, rubs, or gallops. Abdomen:  Soft, nontender, nondistended.  Without guarding or rebound. Extremities: Right upper extremity flaccid, bilateral lower extremities and left upper extremity nonreactive, moving left upper extremity occasionally at will but without purpose. Vascular:  Dorsalis pedis and posterior tibial pulses palpable bilaterally. Skin:  Warm and dry, no erythema, no ulcerations.   Data Reviewed: I have personally reviewed following labs and imaging studies  CBC: Recent Labs  Lab 01/27/21 0445 01/27/21 1737 01/28/21 0439 01/29/21 0640 01/30/21 0748 01/31/21 0645 02/01/21 0036  WBC 10.6*   < > 7.8 6.8 8.7 9.5 11.3*  NEUTROABS 7.9*  --   --   --   --   --   --   HGB 11.4*   < > 10.4* 10.0* 10.6* 11.3* 9.6*  HCT 37.1*   < > 34.8* 33.8* 35.5* 38.8* 33.6*  MCV 85.1   < > 87.4 87.3 87.9 87.6 90.1  PLT 165   < > 159 198 216 171 231   < > = values in this interval not displayed.   Basic Metabolic Panel: Recent Labs  Lab 01/26/21 0744 01/26/21 0907 01/26/21 1708 01/27/21 0445 01/27/21 1737 01/27/21 2016 01/28/21 0439 01/28/21 1309 01/29/21 0640 01/29/21 1325 01/30/21 0748 01/30/21 1246 01/30/21 1844  01/31/21 0125 01/31/21 0645 02/01/21 0036  NA  --    < >  --    < > 142   < > 147*   < > 152*   < > 153* 151* 149* 152* 153* 150*  K  --    < >  --    < > 3.9  --  4.8  --  3.7  --  3.8  --   --   --  4.0 3.9  CL  --    < >  --    < > 110  --  116*  --  123*  --  120*  --   --   --  116* 119*  CO2  --    < >  --    < > 21*  --  19*  --  22  --  24  --   --   --  26 26  GLUCOSE  --    < >  --    < > 216*  --  163*  --  192*  --  101*  --   --   --  73 159*  BUN  --    < >  --    < > 34*  --  30*  --  25*  --  27*  --   --   --  35* 36*  CREATININE  --    < >  --    < > 1.11  --  0.98  --  0.90  --  0.95  --   --   --  1.08 1.09  CALCIUM  --    < >  --    < > 9.1  --  9.1  --  9.4  --  9.7  --   --   --  9.5 9.3  MG 1.8  --  1.8  --  2.0  --   --   --   --   --   --   --   --   --   --   --   PHOS 2.3*  --  3.2  --   --   --   --   --   --   --   --   --   --   --   --   --    < > = values in this interval not displayed.   GFR: Estimated Creatinine Clearance: 65 mL/min (by C-G formula based on SCr of 1.09 mg/dL). Liver Function Tests: Recent Labs  Lab 01/27/21 1737  AST 34  ALT 21  ALKPHOS 52  BILITOT 1.1  PROT 6.7  ALBUMIN 3.3*   No results for input(s): LIPASE, AMYLASE in the last 168 hours. No results for input(s): AMMONIA in the last 168 hours. Coagulation Profile: No results for input(s): INR, PROTIME in the last 168 hours. Cardiac Enzymes: No results for input(s): CKTOTAL, CKMB, CKMBINDEX, TROPONINI in the last 168 hours. BNP (last 3 results) No results for input(s): PROBNP in the last 8760 hours. HbA1C: No results for input(s): HGBA1C in the last 72 hours. CBG: Recent Labs  Lab 02/01/21 1147 02/01/21 1604 02/01/21 1953 02/01/21 2342 02/02/21 0348  GLUCAP 102* 115* 134* 159* 170*   Lipid Profile: No results for input(s): CHOL, HDL, LDLCALC, TRIG, CHOLHDL, LDLDIRECT in the last 72 hours. Thyroid Function Tests: No results for input(s): TSH, T4TOTAL, FREET4,  T3FREE, THYROIDAB in the last 72 hours. Anemia Panel: No results for input(s): VITAMINB12, FOLATE, FERRITIN, TIBC, IRON, RETICCTPCT in the last 72 hours. Sepsis Labs: No results for input(s): PROCALCITON, LATICACIDVEN in the last 168 hours.  Recent Results (from the past 240 hour(s))  MRSA PCR Screening     Status: None   Collection Time: 01/23/21 10:45 PM   Specimen: Nasopharyngeal Wash  Result Value Ref Range Status   MRSA by PCR NEGATIVE NEGATIVE Final    Comment:        The GeneXpert MRSA Assay (FDA approved for NASAL specimens only), is one component of a comprehensive MRSA colonization surveillance program. It is not intended to diagnose MRSA infection nor to guide or monitor treatment for MRSA infections. Performed at [facility], [place] [address], [place], [place] [address]   SARS CORONAVIRUS 2 (TAT 6-24 HRS) Nasopharyngeal Nasopharyngeal Swab     Status: None   Collection Time: 01/23/21 11:58 PM   Specimen: Nasopharyngeal Swab  Result Value Ref Range Status   SARS Coronavirus 2 NEGATIVE NEGATIVE Final    Comment: (NOTE) SARS-CoV-2 target nucleic acids are  NOT DETECTED.  The SARS-CoV-2 RNA is generally detectable in upper and lower respiratory specimens during the acute phase of infection. Negative results do not preclude SARS-CoV-2 infection, do not rule out co-infections with other pathogens, and should not be used as the sole basis for treatment or other patient management decisions. Negative results must be combined with clinical observations, patient history, and epidemiological information. The expected result is Negative.  Fact Sheet for Patients: SugarRoll.be  Fact Sheet for Healthcare Providers: https://www.woods-mathews.com/  This test is not yet approved or cleared by the [place] FDA and  has been authorized for detection and/or diagnosis of SARS-CoV-2 by FDA under an Emergency Use Authorization  (EUA). This EUA will remain  in effect (meaning this test can be used) for the duration of the COVID-19 declaration under Se ction 564(b)(1) of the Act, 21 U.S.C. section 360bbb-3(b)(1), unless the authorization is terminated or revoked sooner.  Performed at [facility], [place] [address] [place], [place] [address]          Radiology Studies: CT HEAD WO CONTRAST  Result Date: 02/01/2021 CLINICAL DATA:  85 year old male with left thalamic hemorrhage presenting on 01/23/2021. EXAM: CT HEAD WITHOUT CONTRAST TECHNIQUE: Contiguous axial images were obtained from the base of the skull through the vertex without intravenous contrast. COMPARISON:  Head CTs 01/30/2021 and earlier. FINDINGS: Brain: Rounded hyperdense hemorrhage centered at the left thalamus is 28 x 29 x 26 mm (AP by transverse by CC) now for an estimated blood volume of 11 mL, mildly decreased compared to 01/23/2021 (16 mL estimated at that time). Surrounding edema and mild regional mass effect remain stable. Small volume of intraventricular extension of blood is stable along with ventricle size and configuration. No new intracranial hemorrhage. No significant midline shift. Basilar cisterns remain normal. Stable gray-white matter differentiation throughout the brain. Chronic bilateral white matter disease, chronic right cerebellar infarcts. Vascular: Calcified atherosclerosis at the skull base. Skull: No acute osseous abnormality identified. Sinuses/Orbits: Visualized paranasal sinuses and mastoids are stable and well aerated. Other: Right nasoenteric tube remains in place. Stable orbit and scalp soft tissues. IMPRESSION: 1. Slowly evolving left thalamic hemorrhage, mildly decreased in volume since 01/23/2021. Stable small volume IVH, regional edema and mass effect. 2. No new intracranial abnormality. Electronically Signed   By: Genevie Ann M.D.   On: 02/01/2021 08:06        Scheduled Meds: .  stroke: mapping our early stages of  recovery book   Does not apply Once  . [START ON 02/07/2021] amiodarone  400 mg Per Tube Daily   Followed by  . [START ON 02/28/2021] amiodarone  200 mg Per Tube Daily  . amiodarone  400 mg Per Tube BID  . amLODipine  10 mg Per Tube Daily  . carvedilol  25 mg Per Tube BID WC  . chlorhexidine  15 mL Mouth Rinse BID  . Chlorhexidine Gluconate Cloth  6 each Topical Daily  . enoxaparin (LOVENOX) injection  40 mg Subcutaneous Q24H  . feeding supplement (PROSource TF)  45 mL Per Tube TID  . free water  100 mL Per Tube Q4H  . gabapentin  100 mg Per Tube TID  . insulin aspart  3-9 Units Subcutaneous Q4H  . insulin detemir  20 Units Subcutaneous BID  . losartan  50 mg Per Tube BID  . mouth rinse  15 mL Mouth Rinse q12n4p  . pantoprazole sodium  40 mg Per Tube QHS  . senna-docusate  1 tablet Per Tube  BID  . sodium chloride flush  3 mL Intravenous Once   Continuous Infusions: . sodium chloride 10 mL/hr at 02/02/21 0212  . ampicillin-sulbactam (UNASYN) IV Stopped (02/02/21 0153)  . feeding supplement (JEVITY 1.5 CAL/FIBER) 1,000 mL (02/02/21 0453)     LOS: 10 days   Time spent: 41min  Janijah Symons C Darrius Montano, DO [facility]  If 7PM-7AM, please contact night-coverage [email or web address]  02/02/2021, 7:18 AM

## 2021-02-02 NOTE — Progress Notes (Signed)
STROKE TEAM PROGRESS NOTE   SUBJECTIVE (INTERVAL HISTORY) RN is at the bedside. Pt lying in bed, in sleep on initial rounding, but later aroused by tactile stimulation, able to open eyes, spontaneous moving on the left but still has global aphasia, left gaze preference and right neglect and right hemiplegia.   OBJECTIVE Temp:  [98.8 F (37.1 C)-99.7 F (37.6 C)] 99.1 F (37.3 C) (05/19 1101) Pulse Rate:  [62-78] 62 (05/19 1101) Cardiac Rhythm: Normal sinus rhythm;Heart block (05/19 0759) Resp:  [18-20] 18 (05/19 1101) BP: (134-161)/(52-74) 153/65 (05/19 1101) SpO2:  [92 %-97 %] 97 % (05/19 0737) Weight:  [107.4 kg] 107.4 kg (05/19 0431)  Recent Labs  Lab 02/01/21 1953 02/01/21 2342 02/02/21 0348 02/02/21 0853 02/02/21 1212  GLUCAP 134* 159* 170* 98 159*   Recent Labs  Lab 01/26/21 1708 01/27/21 0445 01/27/21 1737 01/27/21 2016 01/28/21 0439 01/28/21 1309 01/29/21 0640 01/29/21 1325 01/30/21 0748 01/30/21 1246 01/30/21 1844 01/31/21 0125 01/31/21 0645 02/01/21 0036  NA  --    < > 142   < > 147*   < > 152*   < > 153* 151* 149* 152* 153* 150*  K  --    < > 3.9  --  4.8  --  3.7  --  3.8  --   --   --  4.0 3.9  CL  --    < > 110  --  116*  --  123*  --  120*  --   --   --  116* 119*  CO2  --    < > 21*  --  19*  --  22  --  24  --   --   --  26 26  GLUCOSE  --    < > 216*  --  163*  --  192*  --  101*  --   --   --  73 159*  BUN  --    < > 34*  --  30*  --  25*  --  27*  --   --   --  35* 36*  CREATININE  --    < > 1.11  --  0.98  --  0.90  --  0.95  --   --   --  1.08 1.09  CALCIUM  --    < > 9.1  --  9.1  --  9.4  --  9.7  --   --   --  9.5 9.3  MG 1.8  --  2.0  --   --   --   --   --   --   --   --   --   --   --   PHOS 3.2  --   --   --   --   --   --   --   --   --   --   --   --   --    < > = values in this interval not displayed.   Recent Labs  Lab 01/27/21 1737  AST 34  ALT 21  ALKPHOS 52  BILITOT 1.1  PROT 6.7  ALBUMIN 3.3*   Recent Labs  Lab  01/27/21 0445 01/27/21 1737 01/28/21 0439 01/29/21 0640 01/30/21 0748 01/31/21 0645 02/01/21 0036  WBC 10.6*   < > 7.8 6.8 8.7 9.5 11.3*  NEUTROABS 7.9*  --   --   --   --   --   --  HGB 11.4*   < > 10.4* 10.0* 10.6* 11.3* 9.6*  HCT 37.1*   < > 34.8* 33.8* 35.5* 38.8* 33.6*  MCV 85.1   < > 87.4 87.3 87.9 87.6 90.1  PLT 165   < > 159 198 216 171 231   < > = values in this interval not displayed.   No results for input(s): CKTOTAL, CKMB, CKMBINDEX, TROPONINI in the last 168 hours. No results for input(s): LABPROT, INR in the last 72 hours. No results for input(s): COLORURINE, LABSPEC, [place], GLUCOSEU, HGBUR, BILIRUBINUR, KETONESUR, PROTEINUR, UROBILINOGEN, NITRITE, LEUKOCYTESUR in the last 72 hours.  Invalid input(s): APPERANCEUR     Component Value Date/Time   CHOL 113 01/28/2021 0439   CHOL 141 10/09/2018 1328   TRIG 41 01/28/2021 0439   HDL 41 01/28/2021 0439   HDL 57 10/09/2018 1328   CHOLHDL 2.8 01/28/2021 0439   VLDL 8 01/28/2021 0439   LDLCALC 64 01/28/2021 0439   LDLCALC 69 10/09/2018 1328   Lab Results  Component Value Date   HGBA1C 6.1 (H) 01/28/2021      Component Value Date/Time   LABOPIA NONE DETECTED 04/01/2018 1148   COCAINSCRNUR NONE DETECTED 04/01/2018 1148   LABBENZ NONE DETECTED 04/01/2018 1148   AMPHETMU NONE DETECTED 04/01/2018 1148   THCU NONE DETECTED 04/01/2018 1148   LABBARB (A) 04/01/2018 1148    Result not available. Reagent lot number recalled by manufacturer.    No results for input(s): ETH in the last 168 hours.  I have personally reviewed the radiological images below and agree with the radiology interpretations.  CT HEAD WO CONTRAST  Result Date: 02/01/2021 CLINICAL DATA:  85 year old male with left thalamic hemorrhage presenting on 01/23/2021. EXAM: CT HEAD WITHOUT CONTRAST TECHNIQUE: Contiguous axial images were obtained from the base of the skull through the vertex without intravenous contrast. COMPARISON:  Head CTs 01/30/2021  and earlier. FINDINGS: Brain: Rounded hyperdense hemorrhage centered at the left thalamus is 28 x 29 x 26 mm (AP by transverse by CC) now for an estimated blood volume of 11 mL, mildly decreased compared to 01/23/2021 (16 mL estimated at that time). Surrounding edema and mild regional mass effect remain stable. Small volume of intraventricular extension of blood is stable along with ventricle size and configuration. No new intracranial hemorrhage. No significant midline shift. Basilar cisterns remain normal. Stable gray-white matter differentiation throughout the brain. Chronic bilateral white matter disease, chronic right cerebellar infarcts. Vascular: Calcified atherosclerosis at the skull base. Skull: No acute osseous abnormality identified. Sinuses/Orbits: Visualized paranasal sinuses and mastoids are stable and well aerated. Other: Right nasoenteric tube remains in place. Stable orbit and scalp soft tissues. IMPRESSION: 1. Slowly evolving left thalamic hemorrhage, mildly decreased in volume since 01/23/2021. Stable small volume IVH, regional edema and mass effect. 2. No new intracranial abnormality. Electronically Signed   By: Genevie Ann M.D.   On: 02/01/2021 08:06   CT HEAD WO CONTRAST  Result Date: 01/30/2021 CLINICAL DATA:  Intracranial hemorrhage EXAM: CT HEAD WITHOUT CONTRAST TECHNIQUE: Contiguous axial images were obtained from the base of the skull through the vertex without intravenous contrast. COMPARISON:  01/29/2021 FINDINGS: Brain: Left thalamic hemorrhage unchanged measuring 28 mm in diameter. Intraventricular hemorrhage unchanged. Negative for hydrocephalus Generalized atrophy with moderate white matter hypodensity diffusely unchanged. No acute ischemic infarct. No midline shift. Vascular: Negative for hyperdense vessel Skull: Negative Sinuses/Orbits: Mild mucosal edema paranasal sinuses. NG tube in place. Other: None IMPRESSION: Stable head CT. Left thalamic hemorrhage and intraventricular  hemorrhage  unchanged. Negative for hydrocephalus. Electronically Signed   By: Marlan Palauharles  Clark M.D.   On: 01/30/2021 08:30   CT HEAD WO CONTRAST  Result Date: 01/29/2021 CLINICAL DATA:  Cerebral hemorrhage suspected EXAM: CT HEAD WITHOUT CONTRAST TECHNIQUE: Contiguous axial images were obtained from the base of the skull through the vertex without intravenous contrast. COMPARISON:  Two days ago FINDINGS: Brain: Acute hematoma centered in the left thalamus and adjacent white matter, 3.1 cm in maximal span on axial images, unchanged when remeasured in a similar fashion. Intraventricular hemorrhage seen at the lateral ventricles with stable mild ventriculomegaly. Chronic small vessel ischemic type low-density in the hemispheric white matter. No evidence of acute cortical infarct. Small remote right cerebellar infarct. Regional mass effect from the hematoma is unchanged. Vascular: Atheromatous calcification Skull: Negative Sinuses/Orbits: Nasal intubation. IMPRESSION: 1. No progression of the left thalamic or intraventricular hemorrhage. 2. Unchanged ventriculomegaly. Electronically Signed   By: Marnee SpringJonathon  Watts M.D.   On: 01/29/2021 10:21   CT Head Wo Contrast  Result Date: 01/27/2021 CLINICAL DATA:  Follow-up hemorrhagic stroke involving the LEFT thalamus with extension into the ventricular system. EXAM: CT HEAD WITHOUT CONTRAST TECHNIQUE: Contiguous axial images were obtained from the base of the skull through the vertex without intravenous contrast. COMPARISON:  01/25/2021 and earlier. FINDINGS: Brain: Hematoma involving the LEFT thalamus measuring approximately 2.6 x 2.8 x 2.8 cm, unchanged in size since the initial 01/23/2021 CT. Interval decrease in the amount of intraventricular hemorrhage in the lateral ventricles and the third ventricle. Mass-effect with midline shift of approximately 7 mm to the RIGHT, unchanged. No new hemorrhage or hematoma. No extra-axial fluid collections. Vascular: Mild BILATERAL  carotid siphon and vertebral artery atherosclerosis. Hyperdense vessel. Skull: No skull fracture or other focal osseous abnormality involving the skull. Sinuses/Orbits: Paranasal sinuses, mastoid air cells and middle ear cavities well-aerated. Benign senile scleral calcifications in both eyes. Other: None. IMPRESSION: 1. Stable LEFT thalamic hematoma and associated midline shift to the RIGHT of approximately 7 mm. 2. Decreasing intraventricular hemorrhage. 3. No new intracranial abnormality. Electronically Signed   By: Hulan Saashomas  Lawrence M.D.   On: 01/27/2021 10:55   CT HEAD WO CONTRAST  Result Date: 01/25/2021 CLINICAL DATA:  Follow-up examination for acute stroke. EXAM: CT HEAD WITHOUT CONTRAST TECHNIQUE: Contiguous axial images were obtained from the base of the skull through the vertex without intravenous contrast. COMPARISON:  Prior CT from 01/23/2021. FINDINGS: Brain: Intraparenchymal hemorrhage centered at the left thalamus is not significantly changed in size or morphology as compared to previous exam. Surrounding edema is stable to mildly increased. Similar regional mass effect with localized left-to-right shift. Intraventricular extension with blood within the lateral and third ventricles, relatively stable. Stable ventricular size and morphology without hydrocephalus or trapping. Basilar cisterns remain patent. No new intracranial hemorrhage or large vessel territory infarct. No extra-axial collection. Underlying atrophy with chronic microvascular ischemic disease again noted. Small remote right cerebellar infarcts noted as well. Vascular: No hyperdense vessel. Scattered vascular calcifications noted within the carotid siphons. Skull: Scalp soft tissues and calvarium demonstrate no acute finding. Sinuses/Orbits: Globes and orbital soft tissues demonstrate no acute finding. Mild scattered mucosal thickening noted within the ethmoidal air cells and maxillary sinuses. Paranasal sinuses are otherwise clear.  Mastoid air cells remain largely clear. Other: None. IMPRESSION: 1. No significant interval change in size and morphology of left thalamic hemorrhage. Surrounding edema has mildly increased from prior, although regional mass effect and localized left-to-right shift is not significantly changed. 2. Intraventricular extension with blood  within the lateral and third ventricles, stable. Stable ventricular size and morphology without hydrocephalus or trapping. 3. No other new acute intracranial abnormality. Electronically Signed   By: Jeannine Boga M.D.   On: 01/25/2021 04:19   CT HEAD WO CONTRAST  Result Date: 01/23/2021 CLINICAL DATA:  Follow-up intracranial hemorrhage EXAM: CT HEAD WITHOUT CONTRAST TECHNIQUE: Contiguous axial images were obtained from the base of the skull through the vertex without intravenous contrast. COMPARISON:  CT from earlier in the same day. FINDINGS: Brain: Parenchymal hemorrhage is again noted in the region of the left thalamus. Some surrounding edema is noted. The hematoma measures approximately 3.0 x 2.4 cm and roughly stable from the prior exam. Some intraventricular component is again noted particularly in the left lateral ventricle. Chronic white matter ischemic changes are noted. No new focal hemorrhage is seen. Chronic right cerebellar infarct is noted. Vascular: No hyperdense vessel or unexpected calcification. Skull: Normal. Negative for fracture or focal lesion. Sinuses/Orbits: No acute finding. Other: None. IMPRESSION: Stable appearing left thalamic hemorrhage with intraventricular component. No new focal abnormality is noted. Electronically Signed   By: Inez Catalina M.D.   On: 01/23/2021 21:54   MR MRA HEAD WO CONTRAST  Result Date: 01/23/2021 CLINICAL DATA:  Initial evaluation for acute intracranial hemorrhage, headache. EXAM: MRI HEAD WITHOUT CONTRAST MRA HEAD WITHOUT CONTRAST TECHNIQUE: Multiplanar, multi-echo pulse sequences of the brain and surrounding  structures were acquired without intravenous contrast. Angiographic images of the Circle of Willis were acquired using MRA technique without intravenous contrast. COMPARISON: No pertinent prior exam. COMPARISON:  Prior CT from earlier the same day as well as previous MRI from 06/23/2018. FINDINGS: MRI HEAD FINDINGS Brain: Examination moderately to severely degraded by motion artifact. Generalized age-related cerebral atrophy. Patchy and confluent T2/FLAIR hyperintensity within the periventricular and deep white matter both cerebral hemispheres most consistent with chronic small vessel ischemic disease. Few small remote right cerebellar infarcts noted. Previously identified acute intraparenchymal hemorrhage centered at the left thalamus again seen. Bleed measures 3.0 x 3.1 x 3.4 cm on this motion degraded exam (estimated volume 16 cc). No visible underlying mass lesion or other structure abnormality on this motion degraded noncontrast examination. Surrounding vasogenic edema with mild regional mass effect. Associated mild localized left-to-right shift measures up to 5 mm at the septum pellucidum. Associated intraventricular extension with blood within the left greater than right lateral ventricles. No hydrocephalus or ventricular trapping at this time. No other evidence for acute intracranial hemorrhage. No other foci of susceptibility artifact to suggest chronic hemorrhage elsewhere within the brain. No other foci of restricted diffusion to suggest acute or subacute ischemia. Gray-white matter differentiation otherwise maintained. No mass lesion or mass effect elsewhere within the brain. No extra-axial fluid collection. Pituitary gland suprasellar region within normal limits. Vascular: Major intracranial vascular flow voids are maintained. Skull and upper cervical spine: Craniocervical junction within normal limits. Bone marrow signal intensity normal. No scalp soft tissue abnormality. Sinuses/Orbits: Patient status  post ocular lens replacement on the right. Globes and orbital soft tissues demonstrate no acute finding. Paranasal sinuses are largely clear. Trace bilateral mastoid effusions, of doubtful significance. Inner ear structures grossly normal. Other: None. MRA HEAD FINDINGS ANTERIOR CIRCULATION: Examination degraded by motion artifact. Both internal carotid arteries patent to the termini without stenosis or other appreciable abnormality. A1 segments patent. Anterior communicating artery complex grossly within normal limits. Anterior cerebral arteries grossly patent to their distal aspects without appreciable stenosis. No obvious M1 stenosis. Grossly normal MCA bifurcations. Distal MCA  branches well perfused and symmetric. POSTERIOR CIRCULATION: Both V4 segments patent to the vertebrobasilar junction without stenosis. Left vertebral artery dominant. Left PICA origin patent and normal. Right PICA origin not definitely seen. Basilar patent to its distal aspect without stenosis. Superior cerebellar arteries patent bilaterally. Both PCAs primarily supplied via the basilar. PCAs perfused to their distal aspects without appreciable stenosis. No intracranial aneurysm. No vascular abnormality seen underlying the acute left thalamic hemorrhage. IMPRESSION: MRI HEAD IMPRESSION: 1. Motion degraded exam. 2. 3.0 x 3.1 x 3.4 cm acute intraparenchymal hemorrhage centered at the left thalamus. Surrounding vasogenic edema with mild regional mass effect and 5 mm of left-to-right shift. No visible underlying mass lesion or other structure abnormality on this motion degraded exam. 3. Associated intraventricular extension with blood within the left greater than right lateral ventricles. No hydrocephalus or ventricular trapping at this time. 4. Underlying age-related cerebral atrophy with mild chronic small vessel ischemic disease, with a few small remote right cerebellar infarcts. MRA HEAD IMPRESSION: 1. Technically limited exam due to  extensive motion artifact. 2. Grossly negative intracranial MRA. No large vessel occlusion, hemodynamically significant stenosis, or other acute vascular abnormality. No vascular abnormality seen underlying the acute left thalamic hemorrhage. Please note that while a MRA neck was also ordered for this exam, this was unable to be performed due to the patient's inability to tolerate the full length of the study and extensive motion artifact. Electronically Signed   By: Jeannine Boga M.D.   On: 01/23/2021 21:41   MR ANGIO NECK W WO CONTRAST  Result Date: 01/23/2021 CLINICAL DATA:  Initial evaluation for acute intracranial hemorrhage, headache. EXAM: MRI HEAD WITHOUT CONTRAST MRA HEAD WITHOUT CONTRAST TECHNIQUE: Multiplanar, multi-echo pulse sequences of the brain and surrounding structures were acquired without intravenous contrast. Angiographic images of the Circle of Willis were acquired using MRA technique without intravenous contrast. COMPARISON: No pertinent prior exam. COMPARISON:  Prior CT from earlier the same day as well as previous MRI from 06/23/2018. FINDINGS: MRI HEAD FINDINGS Brain: Examination moderately to severely degraded by motion artifact. Generalized age-related cerebral atrophy. Patchy and confluent T2/FLAIR hyperintensity within the periventricular and deep white matter both cerebral hemispheres most consistent with chronic small vessel ischemic disease. Few small remote right cerebellar infarcts noted. Previously identified acute intraparenchymal hemorrhage centered at the left thalamus again seen. Bleed measures 3.0 x 3.1 x 3.4 cm on this motion degraded exam (estimated volume 16 cc). No visible underlying mass lesion or other structure abnormality on this motion degraded noncontrast examination. Surrounding vasogenic edema with mild regional mass effect. Associated mild localized left-to-right shift measures up to 5 mm at the septum pellucidum. Associated intraventricular extension  with blood within the left greater than right lateral ventricles. No hydrocephalus or ventricular trapping at this time. No other evidence for acute intracranial hemorrhage. No other foci of susceptibility artifact to suggest chronic hemorrhage elsewhere within the brain. No other foci of restricted diffusion to suggest acute or subacute ischemia. Gray-white matter differentiation otherwise maintained. No mass lesion or mass effect elsewhere within the brain. No extra-axial fluid collection. Pituitary gland suprasellar region within normal limits. Vascular: Major intracranial vascular flow voids are maintained. Skull and upper cervical spine: Craniocervical junction within normal limits. Bone marrow signal intensity normal. No scalp soft tissue abnormality. Sinuses/Orbits: Patient status post ocular lens replacement on the right. Globes and orbital soft tissues demonstrate no acute finding. Paranasal sinuses are largely clear. Trace bilateral mastoid effusions, of doubtful significance. Inner ear structures grossly normal. Other:  None. MRA HEAD FINDINGS ANTERIOR CIRCULATION: Examination degraded by motion artifact. Both internal carotid arteries patent to the termini without stenosis or other appreciable abnormality. A1 segments patent. Anterior communicating artery complex grossly within normal limits. Anterior cerebral arteries grossly patent to their distal aspects without appreciable stenosis. No obvious M1 stenosis. Grossly normal MCA bifurcations. Distal MCA branches well perfused and symmetric. POSTERIOR CIRCULATION: Both V4 segments patent to the vertebrobasilar junction without stenosis. Left vertebral artery dominant. Left PICA origin patent and normal. Right PICA origin not definitely seen. Basilar patent to its distal aspect without stenosis. Superior cerebellar arteries patent bilaterally. Both PCAs primarily supplied via the basilar. PCAs perfused to their distal aspects without appreciable stenosis.  No intracranial aneurysm. No vascular abnormality seen underlying the acute left thalamic hemorrhage. IMPRESSION: MRI HEAD IMPRESSION: 1. Motion degraded exam. 2. 3.0 x 3.1 x 3.4 cm acute intraparenchymal hemorrhage centered at the left thalamus. Surrounding vasogenic edema with mild regional mass effect and 5 mm of left-to-right shift. No visible underlying mass lesion or other structure abnormality on this motion degraded exam. 3. Associated intraventricular extension with blood within the left greater than right lateral ventricles. No hydrocephalus or ventricular trapping at this time. 4. Underlying age-related cerebral atrophy with mild chronic small vessel ischemic disease, with a few small remote right cerebellar infarcts. MRA HEAD IMPRESSION: 1. Technically limited exam due to extensive motion artifact. 2. Grossly negative intracranial MRA. No large vessel occlusion, hemodynamically significant stenosis, or other acute vascular abnormality. No vascular abnormality seen underlying the acute left thalamic hemorrhage. Please note that while a MRA neck was also ordered for this exam, this was unable to be performed due to the patient's inability to tolerate the full length of the study and extensive motion artifact. Electronically Signed   By: Jeannine Boga M.D.   On: 01/23/2021 21:41   MR BRAIN WO CONTRAST  Result Date: 01/23/2021 CLINICAL DATA:  Initial evaluation for acute intracranial hemorrhage, headache. EXAM: MRI HEAD WITHOUT CONTRAST MRA HEAD WITHOUT CONTRAST TECHNIQUE: Multiplanar, multi-echo pulse sequences of the brain and surrounding structures were acquired without intravenous contrast. Angiographic images of the Circle of Willis were acquired using MRA technique without intravenous contrast. COMPARISON: No pertinent prior exam. COMPARISON:  Prior CT from earlier the same day as well as previous MRI from 06/23/2018. FINDINGS: MRI HEAD FINDINGS Brain: Examination moderately to severely  degraded by motion artifact. Generalized age-related cerebral atrophy. Patchy and confluent T2/FLAIR hyperintensity within the periventricular and deep white matter both cerebral hemispheres most consistent with chronic small vessel ischemic disease. Few small remote right cerebellar infarcts noted. Previously identified acute intraparenchymal hemorrhage centered at the left thalamus again seen. Bleed measures 3.0 x 3.1 x 3.4 cm on this motion degraded exam (estimated volume 16 cc). No visible underlying mass lesion or other structure abnormality on this motion degraded noncontrast examination. Surrounding vasogenic edema with mild regional mass effect. Associated mild localized left-to-right shift measures up to 5 mm at the septum pellucidum. Associated intraventricular extension with blood within the left greater than right lateral ventricles. No hydrocephalus or ventricular trapping at this time. No other evidence for acute intracranial hemorrhage. No other foci of susceptibility artifact to suggest chronic hemorrhage elsewhere within the brain. No other foci of restricted diffusion to suggest acute or subacute ischemia. Gray-white matter differentiation otherwise maintained. No mass lesion or mass effect elsewhere within the brain. No extra-axial fluid collection. Pituitary gland suprasellar region within normal limits. Vascular: Major intracranial vascular flow voids are maintained. Skull  and upper cervical spine: Craniocervical junction within normal limits. Bone marrow signal intensity normal. No scalp soft tissue abnormality. Sinuses/Orbits: Patient status post ocular lens replacement on the right. Globes and orbital soft tissues demonstrate no acute finding. Paranasal sinuses are largely clear. Trace bilateral mastoid effusions, of doubtful significance. Inner ear structures grossly normal. Other: None. MRA HEAD FINDINGS ANTERIOR CIRCULATION: Examination degraded by motion artifact. Both internal carotid  arteries patent to the termini without stenosis or other appreciable abnormality. A1 segments patent. Anterior communicating artery complex grossly within normal limits. Anterior cerebral arteries grossly patent to their distal aspects without appreciable stenosis. No obvious M1 stenosis. Grossly normal MCA bifurcations. Distal MCA branches well perfused and symmetric. POSTERIOR CIRCULATION: Both V4 segments patent to the vertebrobasilar junction without stenosis. Left vertebral artery dominant. Left PICA origin patent and normal. Right PICA origin not definitely seen. Basilar patent to its distal aspect without stenosis. Superior cerebellar arteries patent bilaterally. Both PCAs primarily supplied via the basilar. PCAs perfused to their distal aspects without appreciable stenosis. No intracranial aneurysm. No vascular abnormality seen underlying the acute left thalamic hemorrhage. IMPRESSION: MRI HEAD IMPRESSION: 1. Motion degraded exam. 2. 3.0 x 3.1 x 3.4 cm acute intraparenchymal hemorrhage centered at the left thalamus. Surrounding vasogenic edema with mild regional mass effect and 5 mm of left-to-right shift. No visible underlying mass lesion or other structure abnormality on this motion degraded exam. 3. Associated intraventricular extension with blood within the left greater than right lateral ventricles. No hydrocephalus or ventricular trapping at this time. 4. Underlying age-related cerebral atrophy with mild chronic small vessel ischemic disease, with a few small remote right cerebellar infarcts. MRA HEAD IMPRESSION: 1. Technically limited exam due to extensive motion artifact. 2. Grossly negative intracranial MRA. No large vessel occlusion, hemodynamically significant stenosis, or other acute vascular abnormality. No vascular abnormality seen underlying the acute left thalamic hemorrhage. Please note that while a MRA neck was also ordered for this exam, this was unable to be performed due to the patient's  inability to tolerate the full length of the study and extensive motion artifact. Electronically Signed   By: Jeannine Boga M.D.   On: 01/23/2021 21:41   DG CHEST PORT 1 VIEW  Result Date: 01/30/2021 CLINICAL DATA:  Leukocytosis. EXAM: PORTABLE CHEST 1 VIEW COMPARISON:  Jan 27, 2021. FINDINGS: The heart size and mediastinal contours are within normal limits. Distal tip of feeding tube is seen in proximal stomach. Both lungs are clear. The visualized skeletal structures are unremarkable. IMPRESSION: No active disease. Electronically Signed   By: Marijo Conception M.D.   On: 01/30/2021 11:38   DG Chest Port 1 View  Result Date: 01/27/2021 CLINICAL DATA:  Code stroke EXAM: PORTABLE CHEST 1 VIEW COMPARISON:  01/26/2021 FINDINGS: Esophageal tube tip coiled within the proximal stomach. No focal opacity or pleural effusion. Stable cardiomediastinal silhouette with borderline cardiomegaly. No pneumothorax. IMPRESSION: No active disease. Electronically Signed   By: Donavan Foil M.D.   On: 01/27/2021 18:38   DG Chest Port 1 View  Result Date: 01/26/2021 CLINICAL DATA:  Fever, code stroke EXAM: PORTABLE CHEST 1 VIEW COMPARISON:  Radiograph 08/13/2011 FINDINGS: Unchanged cardiomediastinal silhouette. There is no focal airspace disease. There is no pleural effusion or visible pneumothorax. There is no acute osseous abnormality. There is a at weighted tip enteric tube overlying the stomach. IMPRESSION: No evidence of acute cardiopulmonary disease. Electronically Signed   By: Maurine Simmering   On: 01/26/2021 10:06   DG Swallowing Func-Speech Pathology  Result Date: 02/02/2021 Objective Swallowing Evaluation: Type of Study: MBS-Modified Barium Swallow Study  Patient Details Name: DENA CAMMAROTA MRN: [id number] Date of Birth: 1936/05/16 Today's Date: 02/02/2021 Time: SLP Start Time (ACUTE ONLY): 0930 -SLP Stop Time (ACUTE ONLY): 0945 SLP Time Calculation (min) (ACUTE ONLY): 15 min Past Medical History: Past Medical  History: Diagnosis Date . ALLERGIC RHINITIS 10/13/2007 . ANEMIA, CHRONIC DISEASE NEC 03/31/2007 . BENIGN PROSTATIC HYPERTROPHY 03/31/2007 . Chest pain   Stress echo, normal, December, 2012 . Chronic anemia  . DIABETES MELLITUS, TYPE II 03/31/2007 . Simpson DISEASE, LUMBAR 03/31/2007 . ED (erectile dysfunction)  . Ejection fraction   EF  normal, stress echo, December,  2012 . History of prostatitis  . HYPERLIPIDEMIA 03/31/2007 . HYPERTENSION 03/31/2007 . Nephrolithiasis 12/07/2010 . Obesity  . POLYP, ANAL AND RECTAL 03/31/2007 . Right eye trauma   hx as child . Stroke [facility])  Past Surgical History: Past Surgical History: Procedure Laterality Date . [place] SURGERY    1999 . PROSTATE BIOPSY    s/p . RECTAL POLYPECTOMY    Transanal excision 10/2005 . SKIN BIOPSY    s/p right upper back 2009- benign Dr. Tonia Brooms HPI: Pt is a 85 y.o. M who presents with acute onset R sided weakness, facial droop, numbness, aphasia with left gaze preference. CTH showing L thalamic ICH. Significant PMH: afib, DM2, HLD, HTN, stroke  No data recorded Assessment / Plan / Recommendation CHL IP CLINICAL IMPRESSIONS 02/02/2021 Clinical Impression Pt presents with oropharyngeal dysphagia, with cognitive and sensorimotor component. Swallow function is marked by anterior spillage of liquids, weak lingual manipulation of bolus, reduced tongue base retraction and epiglottic inversion,  resulting in buildup of pharyngeal residuals and silent aspiration of liquids. Thin liquids silently aspirated (PAS 8) with pt demontrating delayed cough as testing progressed.  NTL penetrated to cords, with no attempt to eject (PAS 5). While no aspiration or pentration noted with HTL, buildup of pharyngeal residuals entered laryngeal vestibule and fell below vocal cords without response from pt. Pureed and mechanical soft solids increased pharyngeal residuals, but no aspiration noted, however cannot rule out possibility of aspiration of residuals as noted previously. Recommend pt  continue NPO with alternative means of nutrition via Cortrak. Pt may have ice chips after thorough oral care. SLP to f/u for therapeutic trials and determine readiness for PO diet. Recommendations discussed with pt and RN. SLP Visit Diagnosis Dysphagia, oropharyngeal phase (R13.12) Attention and concentration deficit following -- Frontal lobe and executive function deficit following -- Impact on safety and function Moderate aspiration risk   CHL IP TREATMENT RECOMMENDATION 02/02/2021 Treatment Recommendations Therapy as outlined in treatment plan below   Prognosis 02/02/2021 Prognosis for Safe Diet Advancement Fair Barriers to Reach Goals Cognitive deficits;Language deficits;Severity of deficits Barriers/Prognosis Comment -- CHL IP DIET RECOMMENDATION 02/02/2021 SLP Diet Recommendations NPO;Ice chips PRN after oral care Liquid Administration via -- Medication Administration Via alternative means Compensations -- Postural Changes Seated upright at 90 degrees   CHL IP OTHER RECOMMENDATIONS 02/02/2021 Recommended Consults -- Oral Care Recommendations Oral care QID Other Recommendations --   CHL IP FOLLOW UP RECOMMENDATIONS 02/02/2021 Follow up Recommendations Skilled Nursing facility   [facility] IP FREQUENCY AND DURATION 02/02/2021 Speech Therapy Frequency (ACUTE ONLY) min 2x/week Treatment Duration 2 weeks      CHL IP ORAL PHASE 02/02/2021 Oral Phase Impaired Oral - Pudding Teaspoon -- Oral - Pudding Cup -- Oral - Honey Teaspoon NT Oral - Honey Cup Right anterior bolus loss;Weak lingual manipulation;Decreased bolus cohesion Oral -  Nectar Teaspoon NT Oral - Nectar Cup Right anterior bolus loss;Weak lingual manipulation;Decreased bolus cohesion Oral - Nectar Straw NT Oral - Thin Teaspoon NT Oral - Thin Cup Right anterior bolus loss;Weak lingual manipulation;Decreased bolus cohesion Oral - Thin Straw NT Oral - Puree Weak lingual manipulation;Decreased bolus cohesion Oral - Mech Soft Weak lingual manipulation;Decreased bolus cohesion  Oral - Regular NT Oral - Multi-Consistency NT Oral - Pill Weak lingual manipulation;Decreased bolus cohesion Oral Phase - Comment --  CHL IP PHARYNGEAL PHASE 02/02/2021 Pharyngeal Phase Impaired Pharyngeal- Pudding Teaspoon -- Pharyngeal -- Pharyngeal- Pudding Cup -- Pharyngeal -- Pharyngeal- Honey Teaspoon NT Pharyngeal -- Pharyngeal- Honey Cup Reduced pharyngeal peristalsis;Reduced epiglottic inversion;Reduced airway/laryngeal closure;Pharyngeal residue - valleculae;Pharyngeal residue - posterior pharnyx;Reduced tongue base retraction Pharyngeal -- Pharyngeal- Nectar Teaspoon NT Pharyngeal -- Pharyngeal- Nectar Cup Reduced pharyngeal peristalsis;Reduced epiglottic inversion;Reduced airway/laryngeal closure;Pharyngeal residue - valleculae;Pharyngeal residue - posterior pharnyx;Reduced tongue base retraction;Penetration/Aspiration during swallow;Penetration/Apiration after swallow Pharyngeal Material enters airway, CONTACTS cords and then ejected out;Material enters airway, passes BELOW cords without attempt by patient to eject out (silent aspiration);Material enters airway, passes BELOW cords and not ejected out despite cough attempt by patient Pharyngeal- Nectar Straw NT Pharyngeal -- Pharyngeal- Thin Teaspoon NT Pharyngeal -- Pharyngeal- Thin Cup Reduced pharyngeal peristalsis;Reduced epiglottic inversion;Reduced airway/laryngeal closure;Pharyngeal residue - valleculae;Pharyngeal residue - posterior pharnyx;Reduced tongue base retraction;Penetration/Aspiration during swallow Pharyngeal Material enters airway, passes BELOW cords without attempt by patient to eject out (silent aspiration) Pharyngeal- Thin Straw NT Pharyngeal -- Pharyngeal- Puree Reduced epiglottic inversion;Reduced pharyngeal peristalsis;Pharyngeal residue - valleculae;Pharyngeal residue - pyriform Pharyngeal -- Pharyngeal- Mechanical Soft Reduced epiglottic inversion;Reduced pharyngeal peristalsis;Pharyngeal residue - valleculae;Pharyngeal residue  - pyriform;Reduced tongue base retraction Pharyngeal -- Pharyngeal- Regular NT Pharyngeal -- Pharyngeal- Multi-consistency NT Pharyngeal -- Pharyngeal- Pill Reduced epiglottic inversion;Reduced pharyngeal peristalsis;Pharyngeal residue - valleculae;Pharyngeal residue - pyriform;Compensatory strategies attempted (with notebox) Pharyngeal -- Pharyngeal Comment --  CHL IP CERVICAL ESOPHAGEAL PHASE 02/02/2021 Cervical Esophageal Phase WFL Pudding Teaspoon -- Pudding Cup -- Honey Teaspoon -- Honey Cup -- Nectar Teaspoon -- Nectar Cup -- Nectar Straw -- Thin Teaspoon -- Thin Cup -- Thin Straw -- Puree -- Mechanical Soft -- Regular -- Multi-consistency -- Pill -- Cervical Esophageal Comment -- Ellwood Dense, MA, CCC-SLP Acute [facility] Services Office Number: [phone] [name] 02/02/2021, 11:54 AM              EEG adult  Result Date: 01/27/2021 Lora Havens, MD     01/27/2021 11:40 AM Patient Name: DERRYL CHAVANA MRN: [id number] Epilepsy Attending: Lora Havens Referring Physician/Provider: Dr. Antony Contras Date: 01/27/2021 Duration: 23.10 minutes Patient history: 85 year old male with left thalamic hemorrhage.  EEG to evaluate for seizures. Level of alertness: Awake AEDs during EEG study: Gabapentin Technical aspects: This EEG study was done with scalp electrodes positioned according to the 10-20 International system of electrode placement. Electrical activity was acquired at a sampling rate of 500Hz  and reviewed with a high frequency filter of 70Hz  and a low frequency filter of 1Hz . EEG data were recorded continuously and digitally stored. Description: The posterior dominant rhythm consists of 8 Hz activity of moderate voltage (25-35 uV) seen predominantly in posterior head regions, symmetric and reactive to eye opening and eye closing.  EEG showed continuous rhythmic sharply contoured 3 to 5 Hz theta- delta slowing in left frontotemporal region hyperventilation and photic stimulation were not  performed.   ABNORMALITY -Continuous slow, left frontotemporal region IMPRESSION: This study is suggestive of cortical dysfunction in left frontotemporal region likely  secondary to underlying bleed. No seizures or definite epileptiform discharges were seen throughout the recording. Lora Havens   ECHOCARDIOGRAM COMPLETE  Result Date: 01/24/2021    ECHOCARDIOGRAM REPORT   Patient Name:   EARSEL BRADBURY Date of Exam: 01/24/2021 Medical Rec #:  [id number]     Height:       72.0 in Accession #:    [id number]    Weight:       215.6 lb Date of Birth:  [birth year]     BSA:          2.200 m Patient Age:    82 years      BP:           143/92 mmHg Patient Gender: M             HR:           77 bpm. Exam Location:  Inpatient Procedure: 2D Echo, Cardiac Doppler and Color Doppler Indications:    Stroke I63.9  History:        Patient has prior history of Echocardiogram examinations, most                 recent 04/02/2018. Stroke, Signs/Symptoms:Chest Pain; Risk                 Factors:Hypertension, Diabetes and Dyslipidemia.  Sonographer:    Tiffany Dance Referring Phys: [id number] [place]  Sonographer Comments: Suboptimal subcostal window. IMPRESSIONS  1. Left ventricular ejection fraction, by estimation, is 55 to 60%. The left ventricle has normal function. The left ventricle has no regional wall motion abnormalities. Left ventricular diastolic parameters are consistent with Grade I diastolic dysfunction (impaired relaxation).  2. Right ventricular systolic function is normal. The right ventricular size is normal.  3. Left atrial size was mildly dilated.  4. The mitral valve is normal in structure. No evidence of mitral valve regurgitation. No evidence of mitral stenosis.  5. The aortic valve is normal in structure. Aortic valve regurgitation is not visualized. Mild aortic valve sclerosis is present, with no evidence of aortic valve stenosis.  6. The inferior vena cava is normal in size with greater than 50%  respiratory variability, suggesting right atrial pressure of 3 mmHg. Comparison(s): Prior images unable to be directly viewed, comparison made by report only. Conclusion(s)/Recommendation(s): No intracardiac source of embolism detected on this transthoracic study. Consider saline contrast study or TEE to evaluate the atrial septal aneurysm for evidence of PFO/shunting. FINDINGS  Left Ventricle: Left ventricular ejection fraction, by estimation, is 55 to 60%. The left ventricle has normal function. The left ventricle has no regional wall motion abnormalities. The left ventricular internal cavity size was normal in size. There is  borderline concentric left ventricular hypertrophy. Left ventricular diastolic parameters are consistent with Grade I diastolic dysfunction (impaired relaxation). Indeterminate filling pressures. Right Ventricle: The right ventricular size is normal. No increase in right ventricular wall thickness. Right ventricular systolic function is normal. Left Atrium: Left atrial size was mildly dilated. Right Atrium: Right atrial size was normal in size. Pericardium: There is no evidence of pericardial effusion. Mitral Valve: The mitral valve is normal in structure. Mild mitral annular calcification. No evidence of mitral valve regurgitation. No evidence of mitral valve stenosis. Tricuspid Valve: The tricuspid valve is normal in structure. Tricuspid valve regurgitation is not demonstrated. No evidence of tricuspid stenosis. Aortic Valve: The aortic valve is normal in structure. Aortic valve regurgitation is not visualized. Mild aortic valve sclerosis is present, with  no evidence of aortic valve stenosis. Pulmonic Valve: The pulmonic valve was normal in structure. Pulmonic valve regurgitation is not visualized. No evidence of pulmonic stenosis. Aorta: The aortic root is normal in size and structure. Venous: The inferior vena cava is normal in size with greater than 50% respiratory variability,  suggesting right atrial pressure of 3 mmHg. IAS/Shunts: The interatrial septum is aneurysmal. No atrial level shunt detected by color flow Doppler.  LEFT VENTRICLE PLAX 2D LVIDd:         4.00 cm     Diastology LVIDs:         3.00 cm     LV e' medial:    3.70 cm/s LV PW:         1.30 cm     LV E/e' medial:  14.9 LV IVS:        1.20 cm     LV e' lateral:   6.64 cm/s LVOT diam:     2.40 cm     LV E/e' lateral: 8.3 LV SV:         80 LV SV Index:   36 LVOT Area:     4.52 cm  LV Volumes (MOD) LV vol d, MOD A2C: 73.3 ml LV vol d, MOD A4C: 82.0 ml LV vol s, MOD A2C: 28.7 ml LV vol s, MOD A4C: 35.1 ml LV SV MOD A2C:     44.6 ml LV SV MOD A4C:     82.0 ml LV SV MOD BP:      46.3 ml RIGHT VENTRICLE             IVC RV Basal diam:  1.80 cm     IVC diam: 1.60 cm RV S prime:     13.50 cm/s TAPSE (M-mode): 2.3 cm LEFT ATRIUM             Index       RIGHT ATRIUM           Index LA diam:        4.00 cm 1.82 cm/m  RA Area:     12.10 cm LA Vol (A2C):   74.9 ml 34.05 ml/m RA Volume:   22.20 ml  10.09 ml/m LA Vol (A4C):   32.2 ml 14.64 ml/m LA Biplane Vol: 50.7 ml 23.05 ml/m  AORTIC VALVE LVOT Vmax:   94.20 cm/s LVOT Vmean:  64.400 cm/s LVOT VTI:    0.176 m  AORTA Ao Root diam: 3.80 cm Ao Asc diam:  3.80 cm MITRAL VALVE MV Area (PHT): 3.12 cm    SHUNTS MV Decel Time: 243 msec    Systemic VTI:  0.18 m MV E velocity: 55.30 cm/s  Systemic Diam: 2.40 cm MV A velocity: 97.30 cm/s MV E/A ratio:  0.57 Mihai Croitoru MD Electronically signed by Sanda Klein MD Signature Date/Time: 01/24/2021/4:17:34 PM    Final    CT HEAD CODE STROKE WO CONTRAST  Result Date: 01/23/2021 CLINICAL DATA:  Code stroke.  Left-sided facial droop EXAM: CT HEAD WITHOUT CONTRAST TECHNIQUE: Contiguous axial images were obtained from the base of the skull through the vertex without intravenous contrast. COMPARISON:  2019 FINDINGS: Brain: There is acute hemorrhage centered within the left thalamus measuring 2.6 x 2.8 x 3 cm. Surrounding edema is present. There  is partial effacement of the third ventricle. Intraventricular extension is present with hemorrhage seen within the left lateral and third ventricles. Gray-white differentiation is preserved. Patchy and confluent areas of hypoattenuation in the supratentorial white matter  are nonspecific but probably reflect moderate chronic microvascular ischemic changes. Small chronic right cerebellar infarct. Prominence of the ventricles and sulci reflects generalized parenchymal volume loss. Vascular: There is intracranial atherosclerotic calcification at the skull base. Skull: Unremarkable. Sinuses/Orbits: Paranasal sinus mucosal thickening. No acute orbital abnormality. Other: Mastoid air cells are clear. IMPRESSION: Acute left thalamic hemorrhage with mild regional mass effect and intraventricular extension. No hydrocephalus. Chronic microvascular ischemic changes. Small chronic right cerebellar infarct. Initial results were communicated to Dr. Lorrin Goodell at 3:42 pm on 01/23/2021 by text page via the [facility] messaging system. Electronically Signed   By: Macy Mis M.D.   On: 01/23/2021 15:45   VAS US CAROTID  Result Date: 02/01/2021 Carotid Arterial Duplex Study Patient Name:  RHIAN YEARTA  Date of Exam:   01/30/2021 Medical Rec #: [id number]      Accession #:    [id number] Date of Birth: 09/14/1936      Patient Gender: M Patient Age:   85Y Exam Location:  [facility] Procedure:      VAS US CAROTID Referring Phys: [id number] Rosalin Hawking --------------------------------------------------------------------------------  Indications:       CVA. Risk Factors:      Hypertension, hyperlipidemia, Diabetes, prior CVA. Comparison Study:  no prior Performing Technologist: Abram Sander RVS  Examination Guidelines: A complete evaluation includes B-mode imaging, spectral Doppler, color Doppler, and power Doppler as needed of all accessible portions of each vessel. Bilateral testing is considered an integral part of a complete  examination. Limited examinations for reoccurring indications may be performed as noted.  Right Carotid Findings: +----------+--------+--------+--------+------------------+--------+           PSV cm/sEDV cm/sStenosisPlaque DescriptionComments +----------+--------+--------+--------+------------------+--------+ CCA Prox  92      10              heterogenous               +----------+--------+--------+--------+------------------+--------+ CCA Distal80      16              heterogenous               +----------+--------+--------+--------+------------------+--------+ ICA Prox  59      15      1-39%   heterogenous               +----------+--------+--------+--------+------------------+--------+ ICA Distal86      19                                         +----------+--------+--------+--------+------------------+--------+ ECA       99                                                 +----------+--------+--------+--------+------------------+--------+ +----------+--------+-------+--------+-------------------+           PSV cm/sEDV cmsDescribeArm Pressure (mmHG) +----------+--------+-------+--------+-------------------+ Subclavian109                                        +----------+--------+-------+--------+-------------------+ +---------+--------+--+--------+--+---------+ VertebralPSV cm/s58EDV cm/s12Antegrade +---------+--------+--+--------+--+---------+  Left Carotid Findings: +----------+--------+--------+--------+------------------+--------+           PSV cm/sEDV cm/sStenosisPlaque DescriptionComments +----------+--------+--------+--------+------------------+--------+ CCA Prox  98      11  heterogenous               +----------+--------+--------+--------+------------------+--------+ CCA Distal64      12              heterogenous               +----------+--------+--------+--------+------------------+--------+ ICA Prox  55       13      1-39%   heterogenous               +----------+--------+--------+--------+------------------+--------+ ICA Distal62      13                                         +----------+--------+--------+--------+------------------+--------+ ECA       81                                                 +----------+--------+--------+--------+------------------+--------+ +----------+--------+--------+--------+-------------------+           PSV cm/sEDV cm/sDescribeArm Pressure (mmHG) +----------+--------+--------+--------+-------------------+ [id number]                                          +----------+--------+--------+--------+-------------------+ +---------+--------+--+--------+--+---------+ VertebralPSV cm/s53EDV cm/s10Antegrade +---------+--------+--+--------+--+---------+   Summary: Right Carotid: Velocities in the right ICA are consistent with a 1-39% stenosis. Left Carotid: Velocities in the left ICA are consistent with a 1-39% stenosis. Vertebrals: Bilateral vertebral arteries demonstrate antegrade flow. *See table(s) above for measurements and observations.  Electronically signed by Antony Contras MD on 02/01/2021 at 8:15:25 AM.    Final     PHYSICAL EXAM  Temp:  [98.8 F (37.1 C)-99.7 F (37.6 C)] 99.1 F (37.3 C) (05/19 1101) Pulse Rate:  [62-78] 62 (05/19 1101) Resp:  [18-20] 18 (05/19 1101) BP: (134-161)/(52-74) 153/65 (05/19 1101) SpO2:  [92 %-97 %] 97 % (05/19 0737) Weight:  [107.4 kg] 107.4 kg (05/19 0431)  General - Well nourished, well developed, in no apparent distress.  Ophthalmologic - fundi not visualized due to noncooperation.  Cardiovascular - Regular rhythm and rate, not in afib now.  Neuro - initially sleepy but then eyes open with tactile stimulation, global aphasia not following commands. Not able to name and repeat. Left gaze preference but barely cross midline, not blinking to visual threat on the right, PERRL. Right facial droop.  Tongue protrusion not cooperative. LUE spontaneous movement able to against gravity. LLE able to briefly lift up against gravity with spontaneous movement. RUE and RLE 0/5, RUE increased muscle tone. Sensation, coordination and gait not tested.    ASSESSMENT/PLAN Jayren K Flowersis a 85 y.o.malewith history of A. fib on Eliquis, hypertension, hyperlipidemia, diabetes admitted for L Thalamic ICH withIVH and an ICH score of 2. He is s/p Andexanet alpha for presumed ICH on Eliquis.  ICH -left thalamic ICH and IVH with associated cerebral edema in the setting hypertensive emergency and apixaban therapy for PAF  01/23/2021 head CT acute left thalamic ICH and intraventricular extension, no hydrocephalus  Repeat CT Head 5/11 and 5/13 - stable IPH, edema and mass effect 19mm MLS.   MRI head: 3.0 x 3.1 x 3.4 cm acute intraparenchymal hemorrhage centered at the left thalamus. Mild regional mass effect and 5  mm of left-to-right shift.  MRA head: Grossly negative, No LVO  CT repeat 5/15 increased size of 3rd ventricle and frontal and temproal horn  CT repeat 5/16 decreased size of 3rd ventricle and frontal and temporal horn   Repeat CT 5/18 stable ventricle size, no hydrocephalus, left thalamic ICH evolving  CUS unremarkable  2D Echo: EF 55-60%  VTE prophylaxis -Lovenox  On Eliquis for known PAF prior to admission, no anticoagulant or antiplatelets for now given ICH.   Therapy recommendations:  SNF  Disposition:  TBD  Cerebral edema  CT repeat showed stable ICH and IVH with 79mm MLS  on 3% saline -> tapered off-> on FW and TF  Na 138-142-147->153->150  Allow Na gradually trending down  CT repeat 5/15 increased size of 3rd ventricle and frontal and temproal horn  CT repeat 5/16 decreased size of 3rd ventricle and frontal and temporal horn  Repeat CT 5/18 stable ventricle size, no hydrocephalus, left thalamic ICH evolving  PAF SVT x 3  Home medication: Eliquis, reversed with  Andexxa  Followed by Dr. Harrington Challenger  2019 Echo showed EF 45-50%  now with improved EF 55-60%  On amiodarone IV -> po 400mg  bid with titration dosing documented by Dr. Recardo Evangelist.   On coreg 12.5 bid -> 25 bid  No antithrombotic now  Cardiology Dr. Recardo Evangelist on board, appreciate help  Hypertension  Home meds: Norvasc 10, Coreg 12.5, Cozaar 100mg    Off Cleviprex  stable  On Coreg 12.5 -> 25  amlodipine 10 and losartan 50 bid  BP goal < 160  Long term goal normotensive  Hyperlipidemia  Home meds:  Lipitor 80mg   LDL 55, goal < 70  Hold statin in setting of current ICH    Consider to resume statin at discharge  DM2, controlled  Home meds are glargine 14 u hs, metformin 1000 BID, Januvia 100 daily  HgbA1c 5.8, goal < 7.0  Hypoglycemia after correction dose - DM coordinator on board   CBGs  SSI  Dysphagia Feeding  Failed swallow eval 5/11, NPO now  Cortrack tube placed  Jevity @ 60cc/hr, add free water  Speech to follow  May need PEG if going to SNF  AKI, resolved  Cre 1.22->1.39->1.11->0.98->0.90->0.95->1.08->1.09  Setting of severe hypertension  Avoid nephrotoxins  On tube feeding and IV fluid, add FW  Fever Leukocytosis, improved Copious secretion  Tmax 101.7-> afebrile  Monitor fever curve  CXR: no acute findings  CBC with leukocytosis wbc 13-> 8.8->6.8->8.7->9.5->11.3  Urine Culture negative  Chest xray neg  On unasyn    Other Stroke Risk Factors  Advanced Age >/= 72   Former Cigarette smoker quit 1995.   Current ETOH use, advised to drink no more than 2 drink(s) a day  Hx stroke/TIA  Other Active Problems  Constipation - on senokot, will give dulcolax suppository   Hospital day # 10  Neurology will sign off. Please call with questions. Pt will follow up with stroke clinic Dr. Leonie Man at [facility] in about 4-6 weeks. Thanks for the consult.   Rosalin Hawking, MD PhD Stroke Neurology 02/02/2021 4:49 PM    To contact  Stroke Continuity provider, please refer to [email or web address] After hours, contact General Neurology

## 2021-02-02 NOTE — Progress Notes (Signed)
Physical Therapy Treatment Patient Details Name: Brian [name] MRN: [id number] DOB: [birth year] Today's Date: 02/02/2021    History of Present Illness Pt is a 85 y.o. M who presents with acute onset R sided weakness, facial droop, numbness, aphasia with left gaze preference. CTH showing L thalamic ICH. Significant PMH: afib, DM2, HLD, HTN, stroke.    PT Comments    Patient continues to require totalA+2 for bed mobility and max-totalA for maintaining sitting balance EOB due to pushing posterior and to R. Patient not following commands this session and no attempts at verbalizations. Performed PROM of R knee and ankle. Continue to recommend SNF for ongoing Physical Therapy.       Follow Up Recommendations  SNF     Equipment Recommendations  Other (comment) (defer to post acute rehab)    Recommendations for Other Services       Precautions / Restrictions Precautions Precautions: Fall;Other (comment) Precaution Comments: R hemiparesis, global aphasia, cortrak Restrictions Weight Bearing Restrictions: No    Mobility  Bed Mobility Overal bed mobility: Needs Assistance Bed Mobility: Sit to Supine;Supine to Sit     Supine to sit: Total assist;+2 for physical assistance;+2 for safety/equipment Sit to supine: Total assist;+2 for physical assistance;+2 for safety/equipment   General bed mobility comments: totalA+2 for all aspects of bed mobility with no attempts to initiate movement.    Transfers                 General transfer comment: deferred due to poor sitting balance  Ambulation/Gait                 Stairs             Wheelchair Mobility    Modified Rankin (Stroke Patients Only) Modified Rankin (Stroke Patients Only) Pre-Morbid Rankin Score: Slight disability Modified Rankin: Severe disability     Balance Overall balance assessment: Needs assistance Sitting-balance support: Feet supported;Single extremity supported Sitting balance-Leahy  Scale: Poor Sitting balance - Comments: maxA-totalA to maintain sitting EOB. Pushing posterior and R lateral lean. Attempts to guide L hand off bed and to lap but continues to push posteriorly Postural control: Posterior lean;Right lateral lean                                  Cognition Arousal/Alertness: Awake/alert Behavior During Therapy: Flat affect Overall Cognitive Status: Impaired/Different from baseline Area of Impairment: Following commands                       Following Commands: Follows one step commands inconsistently       General Comments: Not following commands this session and no attempts of verbalization. Keeps L eye closed in sitting      Exercises Other Exercises Other Exercises: PROM R knee extension and DF    General Comments        Pertinent Vitals/Pain Pain Assessment: Faces Faces Pain Scale: No hurt Pain Intervention(s): Monitored during session    Home Living                      Prior Function            PT Goals (current goals can now be found in the care plan section) Acute Rehab PT Goals Patient Stated Goal: none stated PT Goal Formulation: Patient unable to participate in goal setting Time For Goal Achievement: 02/07/21 Potential to Achieve  Goals: Fair Progress towards PT goals: Not progressing toward goals - comment    Frequency    Min 3X/week      PT Plan Current plan remains appropriate    Co-evaluation PT/OT/SLP Co-Evaluation/Treatment: Yes Reason for Co-Treatment: Necessary to address cognition/behavior during functional activity;Complexity of the patient's impairments (multi-system involvement);For patient/therapist safety;To address functional/ADL transfers PT goals addressed during session: Mobility/safety with mobility;Balance        AM-PAC PT "6 Clicks" Mobility   Outcome Measure  Help needed turning from your back to your side while in a flat bed without using bedrails?:  Total Help needed moving from lying on your back to sitting on the side of a flat bed without using bedrails?: Total Help needed moving to and from a bed to a chair (including a wheelchair)?: Total Help needed standing up from a chair using your arms (e.g., wheelchair or bedside chair)?: Total Help needed to walk in hospital room?: Total Help needed climbing 3-5 steps with a railing? : Total 6 Click Score: 6    End of Session   Activity Tolerance: Patient tolerated treatment well Patient left: in bed;with call bell/phone within reach;with bed alarm set Nurse Communication: Mobility status PT Visit Diagnosis: Other abnormalities of gait and mobility (R26.89);Hemiplegia and hemiparesis Hemiplegia - Right/Left: Right Hemiplegia - dominant/non-dominant: Dominant Hemiplegia - caused by: Nontraumatic intracerebral hemorrhage     Time: 5465-0354 PT Time Calculation (min) (ACUTE ONLY): 24 min  Charges:  $Therapeutic Activity: 8-22 mins                     Brian [name] A. Gilford Rile PT, DPT Acute Rehabilitation Services Pager [phone] Office [phone]    Brian [name] 02/02/2021, 3:30 PM

## 2021-02-02 NOTE — Progress Notes (Signed)
Modified Barium Swallow Progress Note  Patient Details  Name: Brian [name] MRN: [id number] Date of Birth: Jul 07, 1936  Today's Date: 02/02/2021  Modified Barium Swallow completed.  Full report located under Chart Review in the Imaging Section.  Brief recommendations include the following:  Clinical Impression  Pt presents with oropharyngeal dysphagia with cognitive and sensorimotor component. Swallow function is primarily marked by anterior spillage of liquids, weak lingual manipulation of bolus, reduced tongue base retraction and epiglottic inversion, resulting in buildup of pharyngeal residuals and silent aspiration of liquids and residuals. Thin liquids silently aspirated (PAS 8) with pt demontrating delayed cough as testing progressed but he was ultimately unable to fully clear from airway. NTL penetrated to cords, with no attempt to eject (PAS 5). While no aspiration or pentration noted with HTL, buildup of pharyngeal residuals entered laryngeal vestibule and fell below vocal cords without response from pt. Pureed and mechanical soft solids increased pharyngeal residuals, no aspiration noted, however cannot rule out possibility of aspiration of residuals as noted previously. Recommend pt continue NPO with alternative means of nutrition via Cortrak. Pt may have ice chips after thorough oral care. SLP to f/u for therapeutic trials and determine readiness for PO diet. Recommendations discussed with pt and RN.   Swallow Evaluation Recommendations       SLP Diet Recommendations: NPO;Ice chips PRN after oral care       Medication Administration: Via alternative means           Postural Changes: Seated upright at 90 degrees   Oral Care Recommendations: Oral care QID       Ellwood Dense, [place], [place] Office Number: [phone]  [name] 02/02/2021,11:50 AM

## 2021-02-02 NOTE — Progress Notes (Signed)
Occupational Therapy Treatment Patient Details Name: Brian [name] MRN: [id number] DOB: 08/13/1936 Today's Date: 02/02/2021    History of present illness Pt is a 85 y.o. M who presents with acute onset R sided weakness, facial droop, numbness, aphasia with left gaze preference. CTH showing L thalamic ICH. Significant PMH: afib, DM2, HLD, HTN, stroke.   OT comments  Pt remains limited by cognition/commuincation, R hemiparesis and impaired balance.  He is not following commands, resistive to hand over hand support to L UE during grooming tasks and pushes with L UE (posteriorly to R).  PROM to R UE provided, flexor synergy patterns noted- will benefit from elbow splint.  Will follow acutely    Follow Up Recommendations  SNF;Supervision/Assistance - 24 hour    Equipment Recommendations  Wheelchair (measurements OT);Wheelchair cushion (measurements OT);Hospital bed    Recommendations for Other Services      Precautions / Restrictions Precautions Precautions: Fall;Other (comment) Precaution Comments: R hemiparesis, global aphasia, cortrak Restrictions Weight Bearing Restrictions: No       Mobility Bed Mobility Overal bed mobility: Needs Assistance Bed Mobility: Sit to Supine;Supine to Sit     Supine to sit: Total assist;+2 for physical assistance;+2 for safety/equipment Sit to supine: Total assist;+2 for physical assistance;+2 for safety/equipment   General bed mobility comments: totalA+2 for all aspects of bed mobility with no attempts to initiate movement.    Transfers                 General transfer comment: deferred due to poor sitting balance    Balance Overall balance assessment: Needs assistance Sitting-balance support: Feet supported;Single extremity supported Sitting balance-Leahy Scale: Poor Sitting balance - Comments: maxA-totalA to maintain sitting EOB. Pushing posterior and R lateral lean. Attempts to guide L hand off bed and to lap but continues to push  posteriorly Postural control: Posterior lean;Right lateral lean                                 ADL either performed or assessed with clinical judgement   ADL Overall ADL's : Needs assistance/impaired     Grooming: Oral care;Wash/dry face Grooming Details (indicate cue type and reason): total assist, attempted pt assist with [facility] support but resistive to L hand guiding towards face; spontaneously reaching to scratch face with L hand                             Functional mobility during ADLs: Total assistance;+2 for physical assistance;+2 for safety/equipment       Vision   Additional Comments: keeps L eye closed in sitting, able to open both eyes supine.  L gaze preference   Perception     Praxis      Cognition Arousal/Alertness: Awake/alert Behavior During Therapy: Flat affect Overall Cognitive Status: Impaired/Different from baseline Area of Impairment: Following commands                       Following Commands: Follows one step commands inconsistently       General Comments: Not following commands this session and no attempts of verbalization. Keeps L eye closed in sitting        Exercises Exercises: Other exercises Other Exercises Other Exercises: PROM of L UE from shoulder to hand, flexor tone pattern; resting hand splint on upon entry (replaced upon exit)   Shoulder Instructions  General Comments      Pertinent Vitals/ Pain       Pain Assessment: Faces Faces Pain Scale: No hurt Pain Intervention(s): Monitored during session  Home Living                                          Prior Functioning/Environment              Frequency  Min 2X/week        Progress Toward Goals  OT Goals(current goals can now be found in the care plan section)  Progress towards OT goals: Progressing toward goals  Acute Rehab OT Goals Patient Stated Goal: none stated OT Goal Formulation: Patient  unable to participate in goal setting  Plan Discharge plan remains appropriate;Frequency remains appropriate    Co-evaluation    PT/OT/SLP Co-Evaluation/Treatment: Yes Reason for Co-Treatment: For patient/therapist safety;Necessary to address cognition/behavior during functional activity;To address functional/ADL transfers PT goals addressed during session: Mobility/safety with mobility;Balance OT goals addressed during session: ADL's and self-care;Strengthening/ROM      AM-PAC OT "6 Clicks" Daily Activity     Outcome Measure   Help from another person eating meals?: Total Help from another person taking care of personal grooming?: A Lot Help from another person toileting, which includes using toliet, bedpan, or urinal?: Total Help from another person bathing (including washing, rinsing, drying)?: Total Help from another person to put on and taking off regular upper body clothing?: Total Help from another person to put on and taking off regular lower body clothing?: Total 6 Click Score: 7    End of Session    OT Visit Diagnosis: Unsteadiness on feet (R26.81);Muscle weakness (generalized) (M62.81);Other symptoms and signs involving cognitive function;Hemiplegia and hemiparesis Hemiplegia - Right/Left: Right Hemiplegia - dominant/non-dominant: Dominant Hemiplegia - caused by: Nontraumatic intracerebral hemorrhage   Activity Tolerance Patient tolerated treatment well   Patient Left in bed;with call bell/phone within reach;with bed alarm set;with restraints reapplied   Nurse Communication Mobility status;Precautions        Time: 3903-0092 OT Time Calculation (min): 24 min  Charges: OT General Charges $OT Visit: 1 Visit OT Treatments $Self Care/Home Management : 8-22 mins  Jolaine Artist, OT Acute Rehabilitation Services Pager [phone] Office [place] 02/02/2021, 3:50 PM

## 2021-02-02 NOTE — Plan of Care (Signed)
Problem: Health Behavior/Discharge Planning: Goal: Ability to manage health-related needs will improve 02/02/2021 1718 by Vesta Mixer, RN Outcome: Progressing 02/02/2021 1718 by Vesta Mixer, RN Outcome: Progressing   Problem: Clinical Measurements: Goal: Ability to maintain clinical measurements within normal limits will improve 02/02/2021 1718 by Vesta Mixer, RN Outcome: Progressing 02/02/2021 1718 by Vesta Mixer, RN Outcome: Progressing Goal: Will remain free from infection 02/02/2021 1718 by Vesta Mixer, RN Outcome: Progressing 02/02/2021 1718 by Vesta Mixer, RN Outcome: Progressing Goal: Diagnostic test results will improve 02/02/2021 1718 by Vesta Mixer, RN Outcome: Progressing 02/02/2021 1718 by Vesta Mixer, RN Outcome: Progressing Goal: Respiratory complications will improve 02/02/2021 1718 by Vesta Mixer, RN Outcome: Progressing 02/02/2021 1718 by Vesta Mixer, RN Outcome: Progressing Goal: Cardiovascular complication will be avoided 02/02/2021 1718 by Vesta Mixer, RN Outcome: Progressing 02/02/2021 1718 by Vesta Mixer, RN Outcome: Progressing   Problem: Nutrition: Goal: Adequate nutrition will be maintained 02/02/2021 1718 by Vesta Mixer, RN Outcome: Progressing 02/02/2021 1718 by Vesta Mixer, RN Outcome: Progressing   Problem: Education: Goal: Knowledge of disease or condition will improve 02/02/2021 1718 by Vesta Mixer, RN Outcome: Progressing 02/02/2021 1718 by Vesta Mixer, RN Outcome: Progressing Goal: Knowledge of secondary prevention will improve 02/02/2021 1718 by Vesta Mixer, RN Outcome: Progressing 02/02/2021 1718 by Vesta Mixer, RN Outcome: Progressing Goal: Knowledge of patient specific risk factors addressed and post discharge goals established will improve 02/02/2021 1718 by Vesta Mixer, RN Outcome: Progressing 02/02/2021 1718 by Vesta Mixer, RN Outcome:  Progressing   Problem: Intracerebral Hemorrhage Tissue Perfusion: Goal: Complications of Intracerebral Hemorrhage will be minimized 02/02/2021 1718 by Vesta Mixer, RN Outcome: Progressing 02/02/2021 1718 by Vesta Mixer, RN Outcome: Progressing   Problem: Education: Goal: Knowledge of disease or condition will improve 02/02/2021 1718 by Vesta Mixer, RN Outcome: Progressing 02/02/2021 1718 by Vesta Mixer, RN Outcome: Progressing Goal: Knowledge of secondary prevention will improve 02/02/2021 1718 by Vesta Mixer, RN Outcome: Progressing 02/02/2021 1718 by Vesta Mixer, RN Outcome: Progressing Goal: Knowledge of patient specific risk factors addressed and post discharge goals established will improve 02/02/2021 1718 by Vesta Mixer, RN Outcome: Progressing 02/02/2021 1718 by Vesta Mixer, RN Outcome: Progressing Goal: Individualized Educational Video(s) 02/02/2021 1718 by Vesta Mixer, RN Outcome: Progressing 02/02/2021 1718 by Vesta Mixer, RN Outcome: Progressing   Problem: Coping: Goal: Will verbalize positive feelings about self 02/02/2021 1718 by Vesta Mixer, RN Outcome: Progressing 02/02/2021 1718 by Vesta Mixer, RN Outcome: Progressing Goal: Will identify appropriate support needs 02/02/2021 1718 by Vesta Mixer, RN Outcome: Progressing 02/02/2021 1718 by Vesta Mixer, RN Outcome: Progressing   Problem: Health Behavior/Discharge Planning: Goal: Ability to manage health-related needs will improve 02/02/2021 1718 by Vesta Mixer, RN Outcome: Progressing 02/02/2021 1718 by Vesta Mixer, RN Outcome: Progressing   Problem: Self-Care: Goal: Ability to participate in self-care as condition permits will improve 02/02/2021 1718 by Vesta Mixer, RN Outcome: Progressing 02/02/2021 1718 by Vesta Mixer, RN Outcome: Progressing Goal: Verbalization of feelings and concerns over difficulty with self-care will  improve 02/02/2021 1718 by Vesta Mixer, RN Outcome: Progressing 02/02/2021 1718 by Vesta Mixer, RN Outcome: Progressing Goal: Ability to communicate needs accurately will improve 02/02/2021 1718 by Vesta Mixer, RN Outcome: Progressing 02/02/2021 1718 by Vesta Mixer, RN Outcome: Progressing   Problem: Nutrition: Goal: Risk  of aspiration will decrease 02/02/2021 1718 by Vesta Mixer, RN Outcome: Progressing 02/02/2021 1718 by Vesta Mixer, RN Outcome: Progressing Goal: Dietary intake will improve 02/02/2021 1718 by Vesta Mixer, RN Outcome: Progressing 02/02/2021 1718 by Vesta Mixer, RN Outcome: Progressing   Problem: Intracerebral Hemorrhage Tissue Perfusion: Goal: Complications of Intracerebral Hemorrhage will be minimized 02/02/2021 1718 by Vesta Mixer, RN Outcome: Progressing 02/02/2021 1718 by Vesta Mixer, RN Outcome: Progressing

## 2021-02-02 NOTE — Plan of Care (Signed)
  Problem: Health Behavior/Discharge Planning: Goal: Ability to manage health-related needs will improve Outcome: Progressing   Problem: Clinical Measurements: Goal: Ability to maintain clinical measurements within normal limits will improve Outcome: Progressing Goal: Will remain free from infection Outcome: Progressing Goal: Diagnostic test results will improve Outcome: Progressing Goal: Respiratory complications will improve Outcome: Progressing Goal: Cardiovascular complication will be avoided Outcome: Progressing   Problem: Nutrition: Goal: Adequate nutrition will be maintained Outcome: Progressing   Problem: Education: Goal: Knowledge of disease or condition will improve Outcome: Progressing Goal: Knowledge of secondary prevention will improve Outcome: Progressing Goal: Knowledge of patient specific risk factors addressed and post discharge goals established will improve Outcome: Progressing   Problem: Intracerebral Hemorrhage Tissue Perfusion: Goal: Complications of Intracerebral Hemorrhage will be minimized Outcome: Progressing   Problem: Education: Goal: Knowledge of disease or condition will improve Outcome: Progressing Goal: Knowledge of secondary prevention will improve Outcome: Progressing Goal: Knowledge of patient specific risk factors addressed and post discharge goals established will improve Outcome: Progressing Goal: Individualized Educational Video(s) Outcome: Progressing   Problem: Coping: Goal: Will verbalize positive feelings about self Outcome: Progressing Goal: Will identify appropriate support needs Outcome: Progressing   Problem: Health Behavior/Discharge Planning: Goal: Ability to manage health-related needs will improve Outcome: Progressing   Problem: Self-Care: Goal: Ability to participate in self-care as condition permits will improve Outcome: Progressing Goal: Verbalization of feelings and concerns over difficulty with self-care  will improve Outcome: Progressing Goal: Ability to communicate needs accurately will improve Outcome: Progressing   Problem: Nutrition: Goal: Risk of aspiration will decrease Outcome: Progressing Goal: Dietary intake will improve Outcome: Progressing   Problem: Intracerebral Hemorrhage Tissue Perfusion: Goal: Complications of Intracerebral Hemorrhage will be minimized Outcome: Progressing

## 2021-02-03 ENCOUNTER — Inpatient Hospital Stay (HOSPITAL_COMMUNITY): Payer: Medicare HMO

## 2021-02-03 DIAGNOSIS — I10 Essential (primary) hypertension: Secondary | ICD-10-CM | POA: Diagnosis not present

## 2021-02-03 LAB — GLUCOSE, CAPILLARY
Glucose-Capillary: 129 mg/dL — ABNORMAL HIGH (ref 70–99)
Glucose-Capillary: 137 mg/dL — ABNORMAL HIGH (ref 70–99)
Glucose-Capillary: 157 mg/dL — ABNORMAL HIGH (ref 70–99)
Glucose-Capillary: 166 mg/dL — ABNORMAL HIGH (ref 70–99)
Glucose-Capillary: 168 mg/dL — ABNORMAL HIGH (ref 70–99)
Glucose-Capillary: 195 mg/dL — ABNORMAL HIGH (ref 70–99)
Glucose-Capillary: 239 mg/dL — ABNORMAL HIGH (ref 70–99)

## 2021-02-03 LAB — BASIC METABOLIC PANEL
Anion gap: 6 (ref 5–15)
BUN: 31 mg/dL — ABNORMAL HIGH (ref 8–23)
CO2: 28 mmol/L (ref 22–32)
Calcium: 8.8 mg/dL — ABNORMAL LOW (ref 8.9–10.3)
Chloride: 110 mmol/L (ref 98–111)
Creatinine, Ser: 1.14 mg/dL (ref 0.61–1.24)
GFR, Estimated: >60 mL/min (ref >60)
Glucose, Bld: 159 mg/dL — ABNORMAL HIGH (ref 70–99)
Potassium: 3.9 mmol/L (ref 3.5–5.1)
Sodium: 144 mmol/L (ref 135–145)

## 2021-02-03 LAB — CBC
HCT: 30.3 % — ABNORMAL LOW (ref 39.0–52.0)
Hemoglobin: 8.8 g/dL — ABNORMAL LOW (ref 13.0–17.0)
MCH: 26 pg (ref 26.0–34.0)
MCHC: 29 g/dL — ABNORMAL LOW (ref 30.0–36.0)
MCV: 89.4 fL (ref 80.0–100.0)
Platelets: 265 10*3/uL (ref 150–400)
RBC: 3.39 MIL/uL — ABNORMAL LOW (ref 4.22–5.81)
RDW: 15.2 % (ref 11.5–15.5)
WBC: 8.6 10*3/uL (ref 4.0–10.5)
nRBC: 1.2 % — ABNORMAL HIGH (ref 0.0–0.2)

## 2021-02-03 NOTE — Progress Notes (Signed)
PROGRESS NOTE    Brian [name]  ZOX:[id number] DOB: [birth year] DOA: 01/23/2021 PCP: [name], MD   Brief Narrative:  Patient is 85 yo male admitted to [facility] on 01/23/2021 for acute onset right sided weakness and language deficit. Pertinent PMH of HTN, HLD, EF 45-50% on 2019, T2DM, Chronic anemia, Afib on Eliquis at home, prior L MCa Stroke. CT head w/o contrast and MRI on 5/9 showing L thalamic intracranial hemorrhage with cytoxic edema/with mild 55mm left to right shift. Patient was admitted with known Afib on anticoagulation with Apixaban which was reversed in ED with Andexanet Alpha. Also requiring Cleviprex infusion for hypertensive emergency. Admitted to Neuro ICU under the stroke service. Course complicated with ongoing HTN requiring Cleviprex infusion. Mentation initially somewhat improving. Patient developed fever with increased lethargy on 5/12. Failed swallow eval and core-track was placed. Cleviprex stopped on 5/12 and started on oral anti hypertensives. On 5/13 low grade fever persist with downtrending WBC. EEG shows focal left-sided slowing but no focal seizures.  Repeat imaging shows what appears to be evolving intracranial hemorrhaging per neurology with poor prognosis.  Transitioning out of the ICU, hospitalist called to take over care on 02/01/2021.  Assessment & Plan:   Active Problems:   Hypertensive crisis   ICH (intracerebral hemorrhage) (HCC)   SVT (supraventricular tachycardia) (HCC)  ICH previously on eliquis; s/p andexant alpha, POA History of ischemic stroke - Left thalamic ICH with 7 mm midline shift - Neurology signing off as of 02/03/2021-repeat imaging shows evolving intracranial hemorrhage on the 15th 16th and 18 of May -no further recommendations, outpatient follow-up with Dr. Leonie Man in 4 to 6 weeks - Induced hypernatremia in the setting of cerebral edema, now downtrending - stopped eliquis; reversed at admit - Statin ongoing  Hypertensive emergency, resolving -  Stroke team signed off as above - SBP goal less than 160 -currently at goal - Continue coreg, losartan, amlodipine; PRN labetalol and hydralazine - Cardiology following - Aspiration precautions ongoing, extremely high risk for aspiration  Sustained atrial tachycardia - developed 2 episodes on 5/16 of SVT - Cardiology following, appreciate insights/recs - on carvedilol, amiodarone with as needed labetalol  History of Atrial Fibrillation - Chronically anticoagulated on Eliquis, this was reversed on arrival  - Holding anticoagulation due to [place] - Follow rate control as above  Possible aspiration pneumonia, resolving - Complete 7 days of unasyn - Afebrile for 24 hours - CXR (5/16)- clear lungs bilaterally - Continue tube feeds via cortrak -we will likely need to transition to PEG tube feedings in the next week for more definitive and permanent solution - Aspiration precautions, NTS PRN  Diabetes type 2 -Increase Levemir 20 twice daily, discontinue Premeal coverage given he is on continuous tube feeds -Continue every 6 hour Accu-Chek; continue hypoglycemic protocol -CBG goal 140-180  Dysphagia secondary to mental status and stroke - Continue tube feeds via core track - Aspiration precautions - SLP following - NTS PRN  Acute anemia likely due to critical illness (normocytic, hypochromic) -transfuse for Hb <7 or hemodynamically significant bleeding    DVT prophylaxis: None given intracranial hemorrhage as above Code Status: Full Family Communication: Daughter Brian [name] updated at length over the phone/bedside  Status is: Inpatient  Dispo: The patient is from: Home              Anticipated d/c is to: TBD pending clinical course              Anticipated d/c date is: >72h  Patient currently not medically stable for discharge  Consultants:   Cards, Neuro, PCCM  Procedures:   None  Antimicrobials:  Completed Unasyn as above  Subjective: No acute issues  or events overnight patient's review of systems markedly limited given mental status  Objective: Vitals:   02/02/21 2358 02/03/21 0354 02/03/21 0449 02/03/21 0743  BP: (!) 151/63 (!) 160/66  (!) 158/66  Pulse: 63 68  67  Resp: 19 19  19   Temp: 99.2 F (37.3 C) 98.7 F (37.1 C)    TempSrc:  Oral    SpO2: 100% 99%    Weight:   106.3 kg   Height:        Intake/Output Summary (Last 24 hours) at 02/03/2021 0800 Last data filed at 02/03/2021 0645 Gross per 24 hour  Intake 510.58 ml  Output 725 ml  Net -214.42 ml   Filed Weights   02/01/21 0355 02/02/21 0431 02/03/21 0449  Weight: 100 kg 107.4 kg 106.3 kg    Examination:  General:  Pleasantly resting in bed, No acute distress.  Poorly interactive HEENT: Pinpoint nonreactive pupils, minimal reflex, core track in place Neck:  Without mass or deformity.  Trachea is midline. Lungs: Coarse breath sounds bilaterally without overt wheeze or rales Heart:  Regular rate and rhythm.  Without murmurs, rubs, or gallops. Abdomen:  Soft, nontender, nondistended.  Without guarding or rebound. Extremities: Right upper extremity flaccid, bilateral lower extremities and left upper extremity nonreactive, moving left upper extremity occasionally at will but without purpose. Vascular:  Dorsalis pedis and posterior tibial pulses palpable bilaterally. Skin:  Warm and dry, no erythema, no ulcerations.   Data Reviewed: I have personally reviewed following labs and imaging studies  CBC: Recent Labs  Lab 01/29/21 0640 01/30/21 0748 01/31/21 0645 02/01/21 0036 02/03/21 0500  WBC 6.8 8.7 9.5 11.3* 8.6  HGB 10.0* 10.6* 11.3* 9.6* 8.8*  HCT 33.8* 35.5* 38.8* 33.6* 30.3*  MCV 87.3 87.9 87.6 90.1 89.4  PLT 198 216 171 231 [id number]   Basic Metabolic Panel: Recent Labs  Lab 01/27/21 1737 01/27/21 2016 01/29/21 0640 01/29/21 1325 01/30/21 0748 01/30/21 1246 01/30/21 1844 01/31/21 0125 01/31/21 0645 02/01/21 0036 02/03/21 0500  NA 142   < > 152*    < > 153*   < > 149* 152* 153* 150* 144  K 3.9   < > 3.7  --  3.8  --   --   --  4.0 3.9 3.9  CL 110   < > 123*  --  120*  --   --   --  116* 119* 110  CO2 21*   < > 22  --  24  --   --   --  26 26 28   GLUCOSE 216*   < > 192*  --  101*  --   --   --  73 159* 159*  BUN 34*   < > 25*  --  27*  --   --   --  35* 36* 31*  CREATININE 1.11   < > 0.90  --  0.95  --   --   --  1.08 1.09 1.14  CALCIUM 9.1   < > 9.4  --  9.7  --   --   --  9.5 9.3 8.8*  MG 2.0  --   --   --   --   --   --   --   --   --   --    < > =  values in this interval not displayed.   GFR: Estimated Creatinine Clearance: 61.9 mL/min (by C-G formula based on SCr of 1.14 mg/dL). Liver Function Tests: Recent Labs  Lab 01/27/21 1737  AST 34  ALT 21  ALKPHOS 52  BILITOT 1.1  PROT 6.7  ALBUMIN 3.3*   No results for input(s): LIPASE, AMYLASE in the last 168 hours. No results for input(s): AMMONIA in the last 168 hours. Coagulation Profile: No results for input(s): INR, PROTIME in the last 168 hours. Cardiac Enzymes: No results for input(s): CKTOTAL, CKMB, CKMBINDEX, TROPONINI in the last 168 hours. BNP (last 3 results) No results for input(s): PROBNP in the last 8760 hours. HbA1C: No results for input(s): HGBA1C in the last 72 hours. CBG: Recent Labs  Lab 02/02/21 1212 02/02/21 1811 02/02/21 2006 02/03/21 0006 02/03/21 0358  GLUCAP 159* 136* 120* 166* 157*   Lipid Profile: No results for input(s): CHOL, HDL, LDLCALC, TRIG, CHOLHDL, LDLDIRECT in the last 72 hours. Thyroid Function Tests: No results for input(s): TSH, T4TOTAL, FREET4, T3FREE, THYROIDAB in the last 72 hours. Anemia Panel: No results for input(s): VITAMINB12, FOLATE, FERRITIN, TIBC, IRON, RETICCTPCT in the last 72 hours. Sepsis Labs: No results for input(s): PROCALCITON, LATICACIDVEN in the last 168 hours.  No results found for this or any previous visit (from the past 240 hour(s)).       Radiology Studies: DG Swallowing Func-Speech  Pathology  Result Date: 02/02/2021 Objective Swallowing Evaluation: Type of Study: MBS-Modified Barium Swallow Study  Patient Details Name: Brian [name] MRN: [id number] Date of Birth: [birth year] Today's Date: 02/02/2021 Time: SLP Start Time (ACUTE ONLY): 0930 -SLP Stop Time (ACUTE ONLY): 0945 SLP Time Calculation (min) (ACUTE ONLY): 15 min Past Medical History: Past Medical History: Diagnosis Date . ALLERGIC RHINITIS 10/13/2007 . ANEMIA, CHRONIC DISEASE NEC 03/31/2007 . BENIGN PROSTATIC HYPERTROPHY 03/31/2007 . Chest pain   Stress echo, normal, December, 2012 . Chronic anemia  . DIABETES MELLITUS, TYPE II 03/31/2007 . [place] DISEASE, LUMBAR 03/31/2007 . ED (erectile dysfunction)  . Ejection fraction   EF  normal, stress echo, December,  2012 . History of prostatitis  . HYPERLIPIDEMIA 03/31/2007 . HYPERTENSION 03/31/2007 . Nephrolithiasis 12/07/2010 . Obesity  . POLYP, ANAL AND RECTAL 03/31/2007 . Right eye trauma   hx as child . Stroke [facility])  Past Surgical History: Past Surgical History: Procedure Laterality Date . [place] SURGERY    1999 . PROSTATE BIOPSY    s/p . RECTAL POLYPECTOMY    Transanal excision 10/2005 . SKIN BIOPSY    s/p right upper back 2009- benign Dr. Tonia Brooms HPI: Pt is a 85 y.o. M who presents with acute onset R sided weakness, facial droop, numbness, aphasia with left gaze preference. CTH showing L thalamic ICH. Significant PMH: afib, DM2, HLD, HTN, stroke  No data recorded Assessment / Plan / Recommendation CHL IP CLINICAL IMPRESSIONS 02/02/2021 Clinical Impression Pt presents with oropharyngeal dysphagia, with cognitive and sensorimotor component. Swallow function is marked by anterior spillage of liquids, weak lingual manipulation of bolus, reduced tongue base retraction and epiglottic inversion,  resulting in buildup of pharyngeal residuals and silent aspiration of liquids. Thin liquids silently aspirated (PAS 8) with pt demontrating delayed cough as testing progressed.  NTL penetrated to cords, with no  attempt to eject (PAS 5). While no aspiration or pentration noted with HTL, buildup of pharyngeal residuals entered laryngeal vestibule and fell below vocal cords without response from pt. Pureed and mechanical soft solids increased pharyngeal residuals, but no aspiration noted, however  cannot rule out possibility of aspiration of residuals as noted previously. Recommend pt continue NPO with alternative means of nutrition via Cortrak. Pt may have ice chips after thorough oral care. SLP to f/u for therapeutic trials and determine readiness for PO diet. Recommendations discussed with pt and RN. SLP Visit Diagnosis Dysphagia, oropharyngeal phase (R13.12) Attention and concentration deficit following -- Frontal lobe and executive function deficit following -- Impact on safety and function Moderate aspiration risk   CHL IP TREATMENT RECOMMENDATION 02/02/2021 Treatment Recommendations Therapy as outlined in treatment plan below   Prognosis 02/02/2021 Prognosis for Safe Diet Advancement Fair Barriers to Reach Goals Cognitive deficits;Language deficits;Severity of deficits Barriers/Prognosis Comment -- CHL IP DIET RECOMMENDATION 02/02/2021 SLP Diet Recommendations NPO;Ice chips PRN after oral care Liquid Administration via -- Medication Administration Via alternative means Compensations -- Postural Changes Seated upright at 90 degrees   CHL IP OTHER RECOMMENDATIONS 02/02/2021 Recommended Consults -- Oral Care Recommendations Oral care QID Other Recommendations --   CHL IP FOLLOW UP RECOMMENDATIONS 02/02/2021 Follow up Recommendations Skilled Nursing facility   [facility] IP FREQUENCY AND DURATION 02/02/2021 Speech Therapy Frequency (ACUTE ONLY) min 2x/week Treatment Duration 2 weeks      CHL IP ORAL PHASE 02/02/2021 Oral Phase Impaired Oral - Pudding Teaspoon -- Oral - Pudding Cup -- Oral - Honey Teaspoon NT Oral - Honey Cup Right anterior bolus loss;Weak lingual manipulation;Decreased bolus cohesion Oral - Nectar Teaspoon NT Oral -  Nectar Cup Right anterior bolus loss;Weak lingual manipulation;Decreased bolus cohesion Oral - Nectar Straw NT Oral - Thin Teaspoon NT Oral - Thin Cup Right anterior bolus loss;Weak lingual manipulation;Decreased bolus cohesion Oral - Thin Straw NT Oral - Puree Weak lingual manipulation;Decreased bolus cohesion Oral - Mech Soft Weak lingual manipulation;Decreased bolus cohesion Oral - Regular NT Oral - Multi-Consistency NT Oral - Pill Weak lingual manipulation;Decreased bolus cohesion Oral Phase - Comment --  CHL IP PHARYNGEAL PHASE 02/02/2021 Pharyngeal Phase Impaired Pharyngeal- Pudding Teaspoon -- Pharyngeal -- Pharyngeal- Pudding Cup -- Pharyngeal -- Pharyngeal- Honey Teaspoon NT Pharyngeal -- Pharyngeal- Honey Cup Reduced pharyngeal peristalsis;Reduced epiglottic inversion;Reduced airway/laryngeal closure;Pharyngeal residue - valleculae;Pharyngeal residue - posterior pharnyx;Reduced tongue base retraction Pharyngeal -- Pharyngeal- Nectar Teaspoon NT Pharyngeal -- Pharyngeal- Nectar Cup Reduced pharyngeal peristalsis;Reduced epiglottic inversion;Reduced airway/laryngeal closure;Pharyngeal residue - valleculae;Pharyngeal residue - posterior pharnyx;Reduced tongue base retraction;Penetration/Aspiration during swallow;Penetration/Apiration after swallow Pharyngeal Material enters airway, CONTACTS cords and then ejected out;Material enters airway, passes BELOW cords without attempt by patient to eject out (silent aspiration);Material enters airway, passes BELOW cords and not ejected out despite cough attempt by patient Pharyngeal- Nectar Straw NT Pharyngeal -- Pharyngeal- Thin Teaspoon NT Pharyngeal -- Pharyngeal- Thin Cup Reduced pharyngeal peristalsis;Reduced epiglottic inversion;Reduced airway/laryngeal closure;Pharyngeal residue - valleculae;Pharyngeal residue - posterior pharnyx;Reduced tongue base retraction;Penetration/Aspiration during swallow Pharyngeal Material enters airway, passes BELOW cords without  attempt by patient to eject out (silent aspiration) Pharyngeal- Thin Straw NT Pharyngeal -- Pharyngeal- Puree Reduced epiglottic inversion;Reduced pharyngeal peristalsis;Pharyngeal residue - valleculae;Pharyngeal residue - pyriform Pharyngeal -- Pharyngeal- Mechanical Soft Reduced epiglottic inversion;Reduced pharyngeal peristalsis;Pharyngeal residue - valleculae;Pharyngeal residue - pyriform;Reduced tongue base retraction Pharyngeal -- Pharyngeal- Regular NT Pharyngeal -- Pharyngeal- Multi-consistency NT Pharyngeal -- Pharyngeal- Pill Reduced epiglottic inversion;Reduced pharyngeal peristalsis;Pharyngeal residue - valleculae;Pharyngeal residue - pyriform;Compensatory strategies attempted (with notebox) Pharyngeal -- Pharyngeal Comment --  CHL IP CERVICAL ESOPHAGEAL PHASE 02/02/2021 Cervical Esophageal Phase WFL Pudding Teaspoon -- Pudding Cup -- Honey Teaspoon -- Honey Cup -- Nectar Teaspoon -- Nectar Cup -- Nectar Straw -- Thin Teaspoon -- Thin Cup --  Thin Straw -- Puree -- Mechanical Soft -- Regular -- Multi-consistency -- Pill -- Cervical Esophageal Comment -- Brian [name] Dense, MA, CCC-SLP Acute Rehabilitation Services Office Number: [phone] Brian [name] 02/02/2021, 11:54 AM                   Scheduled Meds: .  stroke: mapping our early stages of recovery book   Does not apply Once  . [START ON 02/07/2021] amiodarone  400 mg Per Tube Daily   Followed by  . [START ON 02/28/2021] amiodarone  200 mg Per Tube Daily  . amiodarone  400 mg Per Tube BID  . amLODipine  10 mg Per Tube Daily  . carvedilol  25 mg Per Tube BID WC  . chlorhexidine  15 mL Mouth Rinse BID  . Chlorhexidine Gluconate Cloth  6 each Topical Daily  . enoxaparin (LOVENOX) injection  40 mg Subcutaneous Q24H  . feeding supplement (PROSource TF)  45 mL Per Tube TID  . free water  100 mL Per Tube Q4H  . gabapentin  100 mg Per Tube TID  . insulin aspart  3-9 Units Subcutaneous Q4H  . insulin detemir  20 Units Subcutaneous BID  .  losartan  50 mg Per Tube BID  . mouth rinse  15 mL Mouth Rinse q12n4p  . pantoprazole sodium  40 mg Per Tube QHS  . senna-docusate  1 tablet Per Tube BID  . sodium chloride flush  3 mL Intravenous Once   Continuous Infusions: . sodium chloride Stopped (02/02/21 1345)  . ampicillin-sulbactam (UNASYN) IV Stopped (02/03/21 0135)  . feeding supplement (JEVITY 1.5 CAL/FIBER) 1,000 mL (02/02/21 0453)     LOS: 11 days   Time spent: 1min  Joaquim Tolen C Jhayla Podgorski, DO [facility]  If 7PM-7AM, please contact night-coverage [email or web address]  02/03/2021, 8:00 AM

## 2021-02-03 NOTE — Progress Notes (Signed)
Occupational Therapy Treatment Patient Details Name: Brian [name] MRN: [id number] DOB: May 01, 1936 Today's Date: 02/03/2021    History of present illness Pt is a 85 y.o. M who presents with acute onset R sided weakness, facial droop, numbness, aphasia with left gaze preference. CTH showing L thalamic ICH. Significant PMH: afib, DM2, HLD, HTN, stroke.   OT comments  Patient supine in bed, focused session on PROM and stretch to RUE.  Prolonged gentle stretch provided to RUE with focus on shoulder flexion, ER, elbow and hand extension.  Donned resting hand splint to R hand at end of session, elevated UE on pillow and reinforced importance to RN for elevation due to edema noted.  Pt able to follow simple cue to squeeze/ release L hand at end of session. Will follow acutely.    Follow Up Recommendations  SNF;Supervision/Assistance - 24 hour    Equipment Recommendations  Wheelchair (measurements OT);Wheelchair cushion (measurements OT);Hospital bed    Recommendations for Other Services      Precautions / Restrictions Precautions Precautions: Fall;Other (comment) Precaution Comments: R hemiparesis, global aphasia, cortrak       Mobility Bed Mobility                    Transfers                      Balance                                           ADL either performed or assessed with clinical judgement   ADL                                               Vision       Perception     Praxis      Cognition Arousal/Alertness: Awake/alert Behavior During Therapy: Flat affect Overall Cognitive Status: Impaired/Different from baseline Area of Impairment: Following commands                       Following Commands: Follows one step commands inconsistently       General Comments: pt initally unable to follow simple commands, but at end of session able to squeeze L hand and release on command.  Figidty and  distracted by lines today        Exercises Exercises: Other exercises Other Exercises Other Exercises: PROM of R UE from shoulder to hand--prolonged stretch into extension of joints, donned resting hand splint upon exit.   Shoulder Instructions       General Comments focused on R UE ROM today, continues to present with tone in flexor synergy patterns.    Pertinent Vitals/ Pain       Pain Assessment: Faces Faces Pain Scale: No hurt Pain Intervention(s): Monitored during session  Home Living                                          Prior Functioning/Environment              Frequency  Min 2X/week        Progress Toward Goals  OT Goals(current goals can now be found in the care plan section)  Progress towards OT goals: Progressing toward goals  Acute Rehab OT Goals Patient Stated Goal: none stated OT Goal Formulation: Patient unable to participate in goal setting  Plan Discharge plan remains appropriate;Frequency remains appropriate    Co-evaluation                 AM-PAC OT "6 Clicks" Daily Activity     Outcome Measure   Help from another person eating meals?: Total Help from another person taking care of personal grooming?: A Lot Help from another person toileting, which includes using toliet, bedpan, or urinal?: Total Help from another person bathing (including washing, rinsing, drying)?: Total Help from another person to put on and taking off regular upper body clothing?: Total Help from another person to put on and taking off regular lower body clothing?: Total 6 Click Score: 7    End of Session Equipment Utilized During Treatment: Oxygen  OT Visit Diagnosis: Unsteadiness on feet (R26.81);Muscle weakness (generalized) (M62.81);Other symptoms and signs involving cognitive function;Hemiplegia and hemiparesis Hemiplegia - Right/Left: Right Hemiplegia - dominant/non-dominant: Dominant Hemiplegia - caused by: Nontraumatic  intracerebral hemorrhage   Activity Tolerance Patient tolerated treatment well   Patient Left in bed;with call bell/phone within reach;with bed alarm set;with family/visitor present   Nurse Communication Mobility status;Precautions;Other (comment) (keep R UE elevated)        Time: 1444-1500 OT Time Calculation (min): 16 min  Charges: OT Treatments $Neuromuscular Re-education: 8-22 mins  Jolaine Artist, OT Acute Rehabilitation Services Pager [phone] Office [place] 02/03/2021, 3:16 PM

## 2021-02-04 DIAGNOSIS — I10 Essential (primary) hypertension: Secondary | ICD-10-CM | POA: Diagnosis not present

## 2021-02-04 LAB — GLUCOSE, CAPILLARY
Glucose-Capillary: 121 mg/dL — ABNORMAL HIGH (ref 70–99)
Glucose-Capillary: 135 mg/dL — ABNORMAL HIGH (ref 70–99)
Glucose-Capillary: 153 mg/dL — ABNORMAL HIGH (ref 70–99)
Glucose-Capillary: 157 mg/dL — ABNORMAL HIGH (ref 70–99)
Glucose-Capillary: 162 mg/dL — ABNORMAL HIGH (ref 70–99)
Glucose-Capillary: 183 mg/dL — ABNORMAL HIGH (ref 70–99)

## 2021-02-04 NOTE — Progress Notes (Signed)
Pt left forearm which had iv infiltrated in icu assessed to be hard, red, warm and painful to touch. MD notified and reported to oncoming RN. Delia Heady RN

## 2021-02-04 NOTE — Progress Notes (Signed)
PROGRESS NOTE    Brian [name]  WJX:[id number] DOB: 1936/04/06 DOA: 01/23/2021 PCP: [name], MD   Brief Narrative:  Patient is 85 yo male admitted to [facility] on 01/23/2021 for acute onset right sided weakness and language deficit. Pertinent PMH of HTN, HLD, EF 45-50% on 2019, T2DM, Chronic anemia, Afib on Eliquis at home, prior L MCa Stroke. CT head w/o contrast and MRI on 5/9 showing L thalamic intracranial hemorrhage with cytoxic edema/with mild 28mm left to right shift. Patient was admitted with known Afib on anticoagulation with Apixaban which was reversed in ED with Andexanet Alpha. Also requiring Cleviprex infusion for hypertensive emergency. Admitted to Neuro ICU under the stroke service. Course complicated with ongoing HTN requiring Cleviprex infusion. Mentation initially somewhat improving. Patient developed fever with increased lethargy on 5/12. Failed swallow eval and core-track was placed. Cleviprex stopped on 5/12 and started on oral anti hypertensives. On 5/13 low grade fever persist with downtrending WBC. EEG shows focal left-sided slowing but no focal seizures.  Repeat imaging shows what appears to be evolving intracranial hemorrhaging per neurology with poor prognosis.  Transitioning out of the ICU, hospitalist called to take over care on 02/01/2021. Long term plan for disposition to SNF, pending PEG tube placement, for ongoing PT evaluation and care.  Assessment & Plan:   Active Problems:   Hypertensive crisis   ICH (intracerebral hemorrhage) (HCC)   SVT (supraventricular tachycardia) (HCC)  ICH previously on eliquis; s/p andexant alpha, POA History of ischemic stroke - Left thalamic ICH with 7 mm midline shift - Neurology signing off as of 02/03/2021-repeat imaging shows evolving intracranial hemorrhage on the 15th 16th and 18 of May -no further recommendations, outpatient follow-up with Dr. Leonie Man in 4 to 6 weeks - Induced hypernatremia in the setting of cerebral edema, now  downtrending - stopped eliquis; reversed at admit - Statin ongoing  Hypertensive emergency, resolving - Stroke team signed off as above - SBP goal less than 160 -currently at goal - Continue coreg, losartan, amlodipine; PRN labetalol and hydralazine - Cardiology following - Aspiration precautions ongoing, extremely high risk for aspiration  Sustained atrial tachycardia, resolving - developed 2 episodes on 5/16 of SVT - Cardiology following, appreciate insights/recs - on carvedilol, amiodarone  History of Atrial Fibrillation - Chronically anticoagulated on Eliquis, this was reversed on arrival  - Holding anticoagulation due to [place] - Follow rate control as above  Possible aspiration pneumonia Pneumonia resolving Aspiration likely ongoing given above - Complete 7 days of unasyn - Afebrile for >48 hours - CXR (5/16)- clear lungs bilaterally - Continue tube feeds via cortrak -we will likely need to transition to PEG tube feedings in the next week for more definitive solution - Aspiration precautions, NTS PRN  Diabetes type 2 -Increase Levemir 20 twice daily, discontinue Premeal coverage given he is on continuous tube feeds and well covered for now -Continue every 6 hour Accu-Chek; continue hypoglycemic protocol -CBG goal 140-180  Dysphagia secondary to mental status and stroke - Continue tube feeds via core track - Aspiration precautions - SLP following - NTS PRN  Acute anemia likely due to critical illness (normocytic, hypochromic) -transfuse for Hb <7 or hemodynamically significant bleeding    DVT prophylaxis: None given intracranial hemorrhage as above Code Status: Full Family Communication: Brian [name] updated at length over the phone/bedside  Status is: Inpatient  Dispo: The patient is from: Home              Anticipated d/c is to: TBD -  likely SNF given current level of care needed/PT              Anticipated d/c date is: >72h              Patient  currently not medically stable for discharge  Consultants:   Cards, Neuro, PCCM  Procedures:   None  Antimicrobials:  Completed Unasyn as above  Subjective: Left arm IV infiltrated and removed- IV holiday for 24-48h, off IV meds/fluids. ROS limited given patient's mental status.  Objective: Vitals:   02/03/21 1936 02/03/21 2311 02/04/21 0322 02/04/21 0444  BP: (!) 144/63 (!) 146/60 (!) 132/52   Pulse: 65 66 62   Resp: 18 19 18    Temp: 99.9 F (37.7 C) 99.4 F (37.4 C) 99.8 F (37.7 C)   TempSrc: Oral Oral Oral   SpO2: 100% 98% 96%   Weight:    107.3 kg  Height:        Intake/Output Summary (Last 24 hours) at 02/04/2021 0740 Last data filed at 02/04/2021 0700 Gross per 24 hour  Intake 4300 ml  Output 850 ml  Net 3450 ml   Filed Weights   02/02/21 0431 02/03/21 0449 02/04/21 0444  Weight: 107.4 kg 106.3 kg 107.3 kg    Examination:  General:  Pleasantly resting in bed, No acute distress. Awake and more alert today - tracks motion/voice but does not appear to follow any commands HEENT: Pinpoint minimally reactive pupils, minimal reflex, core track in place Neck:  Without mass or deformity.  Trachea is midline. Lungs: Coarse breath sounds bilaterally without overt wheeze or rales Heart:  Regular rate and rhythm.  Without murmurs, rubs, or gallops. Abdomen:  Soft, nontender, nondistended.  Without guarding or rebound. Extremities: Right upper/lower extremity flaccid, moving left upper extremity frequently at will but without purpose; occasionally moves LLE - difficult to measure strength given inability to follow commands. Vascular:  Dorsalis pedis and posterior tibial pulses palpable bilaterally. Skin:  Warm and dry, no erythema, no ulcerations.   Data Reviewed: I have personally reviewed following labs and imaging studies  CBC: Recent Labs  Lab 01/29/21 0640 01/30/21 0748 01/31/21 0645 02/01/21 0036 02/03/21 0500  WBC 6.8 8.7 9.5 11.3* 8.6  HGB 10.0*  10.6* 11.3* 9.6* 8.8*  HCT 33.8* 35.5* 38.8* 33.6* 30.3*  MCV 87.3 87.9 87.6 90.1 89.4  PLT 198 216 171 231 [id number]   Basic Metabolic Panel: Recent Labs  Lab 01/29/21 0640 01/29/21 1325 01/30/21 0748 01/30/21 1246 01/30/21 1844 01/31/21 0125 01/31/21 0645 02/01/21 0036 02/03/21 0500  NA 152*   < > 153*   < > 149* 152* 153* 150* 144  K 3.7  --  3.8  --   --   --  4.0 3.9 3.9  CL 123*  --  120*  --   --   --  116* 119* 110  CO2 22  --  24  --   --   --  26 26 28   GLUCOSE 192*  --  101*  --   --   --  73 159* 159*  BUN 25*  --  27*  --   --   --  35* 36* 31*  CREATININE 0.90  --  0.95  --   --   --  1.08 1.09 1.14  CALCIUM 9.4  --  9.7  --   --   --  9.5 9.3 8.8*   < > = values in this interval not displayed.   GFR:  Estimated Creatinine Clearance: 62.2 mL/min (by C-G formula based on SCr of 1.14 mg/dL). Liver Function Tests: No results for input(s): AST, ALT, ALKPHOS, BILITOT, PROT, ALBUMIN in the last 168 hours. No results for input(s): LIPASE, AMYLASE in the last 168 hours. No results for input(s): AMMONIA in the last 168 hours. Coagulation Profile: No results for input(s): INR, PROTIME in the last 168 hours. Cardiac Enzymes: No results for input(s): CKTOTAL, CKMB, CKMBINDEX, TROPONINI in the last 168 hours. BNP (last 3 results) No results for input(s): PROBNP in the last 8760 hours. HbA1C: No results for input(s): HGBA1C in the last 72 hours. CBG: Recent Labs  Lab 02/03/21 1206 02/03/21 1550 02/03/21 1937 02/03/21 2310 02/04/21 0322  GLUCAP 137* 168* 195* 239* 121*   Lipid Profile: No results for input(s): CHOL, HDL, LDLCALC, TRIG, CHOLHDL, LDLDIRECT in the last 72 hours. Thyroid Function Tests: No results for input(s): TSH, T4TOTAL, FREET4, T3FREE, THYROIDAB in the last 72 hours. Anemia Panel: No results for input(s): VITAMINB12, FOLATE, FERRITIN, TIBC, IRON, RETICCTPCT in the last 72 hours. Sepsis Labs: No results for input(s): PROCALCITON, LATICACIDVEN in the  last 168 hours.  No results found for this or any previous visit (from the past 240 hour(s)).       Radiology Studies: DG CHEST PORT 1 VIEW  Result Date: 02/03/2021 CLINICAL DATA:  Shortness of breath EXAM: PORTABLE CHEST 1 VIEW COMPARISON:  Jan 30, 2021 FINDINGS: Enteric tube tip is below the diaphragm, not seen on current examination. There is slight left base atelectasis. Lungs elsewhere are clear. Heart is upper normal in size with pulmonary vascularity normal. No adenopathy. There is aortic atherosclerosis. No bone lesions. IMPRESSION: Enteric tube tip below diaphragm. Atelectasis left base. Earliest changes of pneumonia in this area questioned. Lungs elsewhere clear. Stable cardiac silhouette. Aortic Atherosclerosis (ICD10-I70.0). Electronically Signed   By: Lowella Grip III M.D.   On: 02/03/2021 11:35   DG Swallowing Func-Speech Pathology  Result Date: 02/02/2021 Objective Swallowing Evaluation: Type of Study: MBS-Modified Barium Swallow Study  Patient Details Name: KEYLIN STETTNER MRN: [id number] Date of Birth: [birth year] Today's Date: 02/02/2021 Time: SLP Start Time (ACUTE ONLY): 0930 -SLP Stop Time (ACUTE ONLY): 0945 SLP Time Calculation (min) (ACUTE ONLY): 15 min Past Medical History: Past Medical History: Diagnosis Date . ALLERGIC RHINITIS 10/13/2007 . ANEMIA, CHRONIC DISEASE NEC 03/31/2007 . BENIGN PROSTATIC HYPERTROPHY 03/31/2007 . Chest pain   Stress echo, normal, December, 2012 . Chronic anemia  . DIABETES MELLITUS, TYPE II 03/31/2007 . [place] DISEASE, LUMBAR 03/31/2007 . ED (erectile dysfunction)  . Ejection fraction   EF  normal, stress echo, December,  2012 . History of prostatitis  . HYPERLIPIDEMIA 03/31/2007 . HYPERTENSION 03/31/2007 . Nephrolithiasis 12/07/2010 . Obesity  . POLYP, ANAL AND RECTAL 03/31/2007 . Right eye trauma   hx as child . Stroke [facility])  Past Surgical History: Past Surgical History: Procedure Laterality Date . [place] SURGERY    1999 . PROSTATE BIOPSY    s/p . RECTAL  POLYPECTOMY    Transanal excision 10/2005 . SKIN BIOPSY    s/p right upper back 2009- benign Dr. Tonia Brooms HPI: Pt is a 85 y.o. M who presents with acute onset R sided weakness, facial droop, numbness, aphasia with left gaze preference. CTH showing L thalamic ICH. Significant PMH: afib, DM2, HLD, HTN, stroke  No data recorded Assessment / Plan / Recommendation CHL IP CLINICAL IMPRESSIONS 02/02/2021 Clinical Impression Pt presents with oropharyngeal dysphagia, with cognitive and sensorimotor component. Swallow function is marked by anterior  spillage of liquids, weak lingual manipulation of bolus, reduced tongue base retraction and epiglottic inversion,  resulting in buildup of pharyngeal residuals and silent aspiration of liquids. Thin liquids silently aspirated (PAS 8) with pt demontrating delayed cough as testing progressed.  NTL penetrated to cords, with no attempt to eject (PAS 5). While no aspiration or pentration noted with HTL, buildup of pharyngeal residuals entered laryngeal vestibule and fell below vocal cords without response from pt. Pureed and mechanical soft solids increased pharyngeal residuals, but no aspiration noted, however cannot rule out possibility of aspiration of residuals as noted previously. Recommend pt continue NPO with alternative means of nutrition via Cortrak. Pt may have ice chips after thorough oral care. SLP to f/u for therapeutic trials and determine readiness for PO diet. Recommendations discussed with pt and RN. SLP Visit Diagnosis Dysphagia, oropharyngeal phase (R13.12) Attention and concentration deficit following -- Frontal lobe and executive function deficit following -- Impact on safety and function Moderate aspiration risk   CHL IP TREATMENT RECOMMENDATION 02/02/2021 Treatment Recommendations Therapy as outlined in treatment plan below   Prognosis 02/02/2021 Prognosis for Safe Diet Advancement Fair Barriers to Reach Goals Cognitive deficits;Language deficits;Severity of deficits  Barriers/Prognosis Comment -- CHL IP DIET RECOMMENDATION 02/02/2021 SLP Diet Recommendations NPO;Ice chips PRN after oral care Liquid Administration via -- Medication Administration Via alternative means Compensations -- Postural Changes Seated upright at 90 degrees   CHL IP OTHER RECOMMENDATIONS 02/02/2021 Recommended Consults -- Oral Care Recommendations Oral care QID Other Recommendations --   CHL IP FOLLOW UP RECOMMENDATIONS 02/02/2021 Follow up Recommendations Skilled Nursing facility   [facility] IP FREQUENCY AND DURATION 02/02/2021 Speech Therapy Frequency (ACUTE ONLY) min 2x/week Treatment Duration 2 weeks      CHL IP ORAL PHASE 02/02/2021 Oral Phase Impaired Oral - Pudding Teaspoon -- Oral - Pudding Cup -- Oral - Honey Teaspoon NT Oral - Honey Cup Right anterior bolus loss;Weak lingual manipulation;Decreased bolus cohesion Oral - Nectar Teaspoon NT Oral - Nectar Cup Right anterior bolus loss;Weak lingual manipulation;Decreased bolus cohesion Oral - Nectar Straw NT Oral - Thin Teaspoon NT Oral - Thin Cup Right anterior bolus loss;Weak lingual manipulation;Decreased bolus cohesion Oral - Thin Straw NT Oral - Puree Weak lingual manipulation;Decreased bolus cohesion Oral - Mech Soft Weak lingual manipulation;Decreased bolus cohesion Oral - Regular NT Oral - Multi-Consistency NT Oral - Pill Weak lingual manipulation;Decreased bolus cohesion Oral Phase - Comment --  CHL IP PHARYNGEAL PHASE 02/02/2021 Pharyngeal Phase Impaired Pharyngeal- Pudding Teaspoon -- Pharyngeal -- Pharyngeal- Pudding Cup -- Pharyngeal -- Pharyngeal- Honey Teaspoon NT Pharyngeal -- Pharyngeal- Honey Cup Reduced pharyngeal peristalsis;Reduced epiglottic inversion;Reduced airway/laryngeal closure;Pharyngeal residue - valleculae;Pharyngeal residue - posterior pharnyx;Reduced tongue base retraction Pharyngeal -- Pharyngeal- Nectar Teaspoon NT Pharyngeal -- Pharyngeal- Nectar Cup Reduced pharyngeal peristalsis;Reduced epiglottic inversion;Reduced  airway/laryngeal closure;Pharyngeal residue - valleculae;Pharyngeal residue - posterior pharnyx;Reduced tongue base retraction;Penetration/Aspiration during swallow;Penetration/Apiration after swallow Pharyngeal Material enters airway, CONTACTS cords and then ejected out;Material enters airway, passes BELOW cords without attempt by patient to eject out (silent aspiration);Material enters airway, passes BELOW cords and not ejected out despite cough attempt by patient Pharyngeal- Nectar Straw NT Pharyngeal -- Pharyngeal- Thin Teaspoon NT Pharyngeal -- Pharyngeal- Thin Cup Reduced pharyngeal peristalsis;Reduced epiglottic inversion;Reduced airway/laryngeal closure;Pharyngeal residue - valleculae;Pharyngeal residue - posterior pharnyx;Reduced tongue base retraction;Penetration/Aspiration during swallow Pharyngeal Material enters airway, passes BELOW cords without attempt by patient to eject out (silent aspiration) Pharyngeal- Thin Straw NT Pharyngeal -- Pharyngeal- Puree Reduced epiglottic inversion;Reduced pharyngeal peristalsis;Pharyngeal residue - valleculae;Pharyngeal residue - pyriform  Pharyngeal -- Pharyngeal- Mechanical Soft Reduced epiglottic inversion;Reduced pharyngeal peristalsis;Pharyngeal residue - valleculae;Pharyngeal residue - pyriform;Reduced tongue base retraction Pharyngeal -- Pharyngeal- Regular NT Pharyngeal -- Pharyngeal- Multi-consistency NT Pharyngeal -- Pharyngeal- Pill Reduced epiglottic inversion;Reduced pharyngeal peristalsis;Pharyngeal residue - valleculae;Pharyngeal residue - pyriform;Compensatory strategies attempted (with notebox) Pharyngeal -- Pharyngeal Comment --  CHL IP CERVICAL ESOPHAGEAL PHASE 02/02/2021 Cervical Esophageal Phase WFL Pudding Teaspoon -- Pudding Cup -- Honey Teaspoon -- Honey Cup -- Nectar Teaspoon -- Nectar Cup -- Nectar Straw -- Thin Teaspoon -- Thin Cup -- Thin Straw -- Puree -- Mechanical Soft -- Regular -- Multi-consistency -- Pill -- Cervical Esophageal Comment  -- Ellwood Dense, MA, CCC-SLP Acute Rehabilitation Services Office Number: [phone] [name] 02/02/2021, 11:54 AM                   Scheduled Meds: .  stroke: mapping our early stages of recovery book   Does not apply Once  . [START ON 02/07/2021] amiodarone  400 mg Per Tube Daily   Followed by  . [START ON 02/28/2021] amiodarone  200 mg Per Tube Daily  . amiodarone  400 mg Per Tube BID  . amLODipine  10 mg Per Tube Daily  . carvedilol  25 mg Per Tube BID WC  . chlorhexidine  15 mL Mouth Rinse BID  . Chlorhexidine Gluconate Cloth  6 each Topical Daily  . enoxaparin (LOVENOX) injection  40 mg Subcutaneous Q24H  . feeding supplement (PROSource TF)  45 mL Per Tube TID  . free water  100 mL Per Tube Q4H  . gabapentin  100 mg Per Tube TID  . insulin aspart  3-9 Units Subcutaneous Q4H  . insulin detemir  20 Units Subcutaneous BID  . losartan  50 mg Per Tube BID  . mouth rinse  15 mL Mouth Rinse q12n4p  . pantoprazole sodium  40 mg Per Tube QHS  . senna-docusate  1 tablet Per Tube BID  . sodium chloride flush  3 mL Intravenous Once   Continuous Infusions: . sodium chloride Stopped (02/02/21 1345)  . feeding supplement (JEVITY 1.5 CAL/FIBER) 1,000 mL (02/03/21 1932)     LOS: 12 days   Time spent: 50min  Robson Trickey C Redina Zeller, DO [facility]  If 7PM-7AM, please contact night-coverage [email or web address]  02/04/2021, 7:40 AM

## 2021-02-05 DIAGNOSIS — I10 Essential (primary) hypertension: Secondary | ICD-10-CM | POA: Diagnosis not present

## 2021-02-05 LAB — GLUCOSE, CAPILLARY
Glucose-Capillary: 158 mg/dL — ABNORMAL HIGH (ref 70–99)
Glucose-Capillary: 172 mg/dL — ABNORMAL HIGH (ref 70–99)
Glucose-Capillary: 178 mg/dL — ABNORMAL HIGH (ref 70–99)
Glucose-Capillary: 196 mg/dL — ABNORMAL HIGH (ref 70–99)
Glucose-Capillary: 199 mg/dL — ABNORMAL HIGH (ref 70–99)
Glucose-Capillary: 202 mg/dL — ABNORMAL HIGH (ref 70–99)

## 2021-02-05 MED ORDER — INSULIN DETEMIR 100 UNIT/ML ~~LOC~~ SOLN
25.0000 [IU] | Freq: Two times a day (BID) | SUBCUTANEOUS | Status: DC
  Administered 2021-02-05 – 2021-02-28 (×47): 25 [IU] via SUBCUTANEOUS
  Filled 2021-02-05 (×49): qty 0.25

## 2021-02-05 MED ORDER — HYDRALAZINE HCL 20 MG/ML IJ SOLN
5.0000 mg | Freq: Once | INTRAMUSCULAR | Status: AC
  Administered 2021-02-05: 5 mg via INTRAVENOUS
  Filled 2021-02-05: qty 1

## 2021-02-05 NOTE — Progress Notes (Signed)
PROGRESS NOTE    Brian [name]  WUJ:[id number] DOB: Jan 20, 1936 DOA: 01/23/2021 PCP: [name], MD   Brief Narrative:  Patient is 85 yo male admitted to [facility] on 01/23/2021 for acute onset right sided weakness and language deficit. Pertinent PMH of HTN, HLD, EF 45-50% on 2019, T2DM, Chronic anemia, Afib on Eliquis at home, prior L MCa Stroke. CT head w/o contrast and MRI on 5/9 showing L thalamic intracranial hemorrhage with cytoxic edema/with mild 60mm left to right shift. Patient was admitted with known Afib on anticoagulation with Apixaban which was reversed in ED with Andexanet Alpha. Also requiring Cleviprex infusion for hypertensive emergency. Admitted to Neuro ICU under the stroke service. Course complicated with ongoing HTN requiring Cleviprex infusion. Mentation initially somewhat improving. Patient developed fever with increased lethargy on 5/12. Failed swallow eval and core-track was placed. Cleviprex stopped on 5/12 and started on oral anti hypertensives. On 5/13 low grade fever persist with downtrending WBC. EEG shows focal left-sided slowing but no focal seizures.  Repeat imaging shows what appears to be evolving intracranial hemorrhaging per neurology with poor prognosis.  Transitioning out of the ICU, hospitalist called to take over care on 02/01/2021. Long term plan for disposition to SNF, pending PEG tube placement, for ongoing PT evaluation and care.  Assessment & Plan:   Active Problems:   Hypertensive crisis   ICH (intracerebral hemorrhage) (HCC)   SVT (supraventricular tachycardia) (HCC)  ICH previously on eliquis; s/p andexant alpha, POA History of ischemic stroke - Left thalamic ICH with 7 mm midline shift on initial imaging - Neurology signing off as of 02/03/2021-repeat imaging shows evolving intracranial hemorrhage on the 15th 16th and 18 of May -no further recommendations, outpatient follow-up with Dr. Leonie Man in 4 to 6 weeks - Peg tube placement pending family discussion  and approval (Sister agreeable but have not been able to confirm with Son) - Stopped eliquis; reversed at admit - Statin ongoing  Hypertensive emergency, resolved - Stroke team signed off as above - SBP goal less than 160 -currently at goal - Continue coreg, losartan, amlodipine; PRN labetalol and hydralazine - Cardiology following - Aspiration precautions ongoing, extremely high risk for aspiration - transient respiratory symptoms over the past few days as below  Sustained atrial tachycardia, resolving - developed 2 episodes on 5/16 of SVT - Cardiology following, appreciate insights/recs - on carvedilol, amiodarone  History of Atrial Fibrillation - Chronically anticoagulated on Eliquis, this was reversed on arrival  - Holding anticoagulation due to [place] - Follow rate control as above  Possible aspiration pneumonia Pneumonia resolved Aspiration (likely silent) likely ongoing given above - Completed 7 days of unasyn - Afebrile for >72 hours - Repeat CXR shows questionable atelectasis on personal review -  - Continue tube feeds via cortrak -we will likely need to transition to PEG tube feedings in the next week for more definitive solution - Aspiration precautions, NTS PRN  Diabetes type 2 - Increase Levemir 25u twice daily, continue sliding scale - Continue every 6 hour Accu-Chek; continue hypoglycemic protocol - CBG goal 140-180  Dysphagia secondary to mental status and stroke - Continue tube feeds via core track - transitioning to PEG as above pending family approval - Aspiration precautions - SLP following - NTS PRN  Acute anemia likely due to critical illness (normocytic, hypochromic) - Transfuse for Hb <7 or hemodynamically significant bleeding    DVT prophylaxis: None given intracranial hemorrhage as above Code Status: Full Family Communication: Daughter [name] updated at length over the  phone/bedside  Status is: Inpatient  Dispo: The patient is from:  Home              Anticipated d/c is to: TBD - likely SNF given current level of care needed/PT              Anticipated d/c date is: >72h              Patient currently not medically stable for discharge  Consultants:   Cards, Neuro, PCCM  Procedures:   None  Antimicrobials:  Completed Unasyn as above  Subjective: No acute issues or events overnight, previous left arm infiltration appears to be resolving, plan to replace IV in the morning; review of systems severely limited given patient's mental status  Objective: Vitals:   02/04/21 2349 02/05/21 0150 02/05/21 0345 02/05/21 0500  BP: (!) 166/77 (!) 153/65 (!) 158/70   Pulse: 64 67 67   Resp: 18  19   Temp: 99.1 F (37.3 C)  99.3 F (37.4 C)   TempSrc: Oral  Oral   SpO2:   97%   Weight:    107.9 kg  Height:        Intake/Output Summary (Last 24 hours) at 02/05/2021 0718 Last data filed at 02/05/2021 0556 Gross per 24 hour  Intake --  Output 1450 ml  Net -1450 ml   Filed Weights   02/03/21 0449 02/04/21 0444 02/05/21 0500  Weight: 106.3 kg 107.3 kg 107.9 kg    Examination:  General:  Pleasantly resting in bed, No acute distress. Awake and more alert today - tracks motion/voice but does not appear to follow any commands HEENT: Pinpoint minimally reactive pupils, minimal reflex, core track in place Neck:  Without mass or deformity.  Trachea is midline. Lungs: Coarse breath sounds bilaterally without overt wheeze or rales Heart:  Regular rate and rhythm.  Without murmurs, rubs, or gallops. Abdomen:  Soft, nontender, nondistended.  Without guarding or rebound. Extremities: Right upper/lower extremity flaccid, moving left upper extremity frequently at will but without purpose; occasionally moves LLE - difficult to measure strength given inability to follow even simple commands. Vascular:  Dorsalis pedis and posterior tibial pulses palpable bilaterally. Skin:  Warm and dry, no erythema, no ulcerations.   Data  Reviewed: I have personally reviewed following labs and imaging studies  CBC: Recent Labs  Lab 01/30/21 0748 01/31/21 0645 02/01/21 0036 02/03/21 0500  WBC 8.7 9.5 11.3* 8.6  HGB 10.6* 11.3* 9.6* 8.8*  HCT 35.5* 38.8* 33.6* 30.3*  MCV 87.9 87.6 90.1 89.4  PLT 216 171 231 267   Basic Metabolic Panel: Recent Labs  Lab 01/30/21 0748 01/30/21 1246 01/30/21 1844 01/31/21 0125 01/31/21 0645 02/01/21 0036 02/03/21 0500  NA 153*   < > 149* 152* 153* 150* 144  K 3.8  --   --   --  4.0 3.9 3.9  CL 120*  --   --   --  116* 119* 110  CO2 24  --   --   --  26 26 28   GLUCOSE 101*  --   --   --  73 159* 159*  BUN 27*  --   --   --  35* 36* 31*  CREATININE 0.95  --   --   --  1.08 1.09 1.14  CALCIUM 9.7  --   --   --  9.5 9.3 8.8*   < > = values in this interval not displayed.   GFR: Estimated Creatinine Clearance: 62.3  mL/min (by C-G formula based on SCr of 1.14 mg/dL). Liver Function Tests: No results for input(s): AST, ALT, ALKPHOS, BILITOT, PROT, ALBUMIN in the last 168 hours. No results for input(s): LIPASE, AMYLASE in the last 168 hours. No results for input(s): AMMONIA in the last 168 hours. Coagulation Profile: No results for input(s): INR, PROTIME in the last 168 hours. Cardiac Enzymes: No results for input(s): CKTOTAL, CKMB, CKMBINDEX, TROPONINI in the last 168 hours. BNP (last 3 results) No results for input(s): PROBNP in the last 8760 hours. HbA1C: No results for input(s): HGBA1C in the last 72 hours. CBG: Recent Labs  Lab 02/04/21 1221 02/04/21 1653 02/04/21 1946 02/04/21 2350 02/05/21 0344  GLUCAP 183* 157* 162* 135* 178*   Lipid Profile: No results for input(s): CHOL, HDL, LDLCALC, TRIG, CHOLHDL, LDLDIRECT in the last 72 hours. Thyroid Function Tests: No results for input(s): TSH, T4TOTAL, FREET4, T3FREE, THYROIDAB in the last 72 hours. Anemia Panel: No results for input(s): VITAMINB12, FOLATE, FERRITIN, TIBC, IRON, RETICCTPCT in the last 72  hours. Sepsis Labs: No results for input(s): PROCALCITON, LATICACIDVEN in the last 168 hours.  No results found for this or any previous visit (from the past 240 hour(s)).   Radiology Studies: DG CHEST PORT 1 VIEW  Result Date: 02/03/2021 CLINICAL DATA:  Shortness of breath EXAM: PORTABLE CHEST 1 VIEW COMPARISON:  Jan 30, 2021 FINDINGS: Enteric tube tip is below the diaphragm, not seen on current examination. There is slight left base atelectasis. Lungs elsewhere are clear. Heart is upper normal in size with pulmonary vascularity normal. No adenopathy. There is aortic atherosclerosis. No bone lesions. IMPRESSION: Enteric tube tip below diaphragm. Atelectasis left base. Earliest changes of pneumonia in this area questioned. Lungs elsewhere clear. Stable cardiac silhouette. Aortic Atherosclerosis (ICD10-I70.0). Electronically Signed   By: Lowella Grip III M.D.   On: 02/03/2021 11:35   Scheduled Meds: .  stroke: mapping our early stages of recovery book   Does not apply Once  . [START ON 02/07/2021] amiodarone  400 mg Per Tube Daily   Followed by  . [START ON 02/28/2021] amiodarone  200 mg Per Tube Daily  . amiodarone  400 mg Per Tube BID  . amLODipine  10 mg Per Tube Daily  . carvedilol  25 mg Per Tube BID WC  . chlorhexidine  15 mL Mouth Rinse BID  . Chlorhexidine Gluconate Cloth  6 each Topical Daily  . enoxaparin (LOVENOX) injection  40 mg Subcutaneous Q24H  . feeding supplement (PROSource TF)  45 mL Per Tube TID  . free water  100 mL Per Tube Q4H  . gabapentin  100 mg Per Tube TID  . insulin aspart  3-9 Units Subcutaneous Q4H  . insulin detemir  20 Units Subcutaneous BID  . losartan  50 mg Per Tube BID  . mouth rinse  15 mL Mouth Rinse q12n4p  . pantoprazole sodium  40 mg Per Tube QHS  . senna-docusate  1 tablet Per Tube BID   Continuous Infusions: . feeding supplement (JEVITY 1.5 CAL/FIBER) 1,000 mL (02/04/21 1436)     LOS: 13 days   Time spent: 24min  Luva Metzger C  Kaly Mcquary, DO [facility]  If 7PM-7AM, please contact night-coverage [email or web address]  02/05/2021, 7:18 AM

## 2021-02-05 NOTE — NC FL2 (Signed)
[place] MEDICAID FL2 LEVEL OF CARE SCREENING TOOL     IDENTIFICATION  Patient Name: Brian [name] Birthdate: 1936/06/05 Sex: male Admission Date (Current Location): 01/23/2021  [facility] and [place] Number:  Herbalist and Address:  [facility] [place], [place] [address], [place], [place] [address]      Provider Number: [id number]  Attending Physician Name and Address:  Little Ishikawa, MD  Relative Name and Phone Number:  [name], son, [phone]    Current Level of Care: Hospital Recommended Level of Care: [place] Prior Approval Number:    Date Approved/Denied:   PASRR Number: [id number]  Discharge Plan: SNF    Current Diagnoses: Patient Active Problem List   Diagnosis Date Noted  . SVT (supraventricular tachycardia) ([place])   . ICH (intracerebral hemorrhage) ([place]) 01/23/2021  . Skin lesion of right leg 11/14/2020  . Uncontrolled type 2 diabetes mellitus with circulatory disorder causing erectile dysfunction ([place]) 11/14/2020  . Bee sting reaction 07/10/2020  . Vitamin D deficiency 07/10/2020  . Right lumbar radiculopathy 10/20/2019  . Insomnia 01/31/2019  . Acute ischemic left ACA stroke ([place]) 06/23/2018  . History of CVA (cerebrovascular accident)   . PAF (paroxysmal atrial fibrillation) ([place])   . Diastolic dysfunction   . Acute CVA (cerebrovascular accident) ([place]) 04/09/2018  . Cerebral embolism with cerebral infarction 04/02/2018  . Right leg pain 12/04/2017  . Grief 12/04/2017  . Poorly controlled type 2 diabetes mellitus with circulatory disorder ([place]) 10/02/2016  . Allergic conjunctivitis 01/12/2016  . Abdominal pain, other specified site 06/23/2012  . Erectile dysfunction 12/13/2011  . Ejection fraction   . Fatigue 06/14/2011  . Encounter for well adult exam with abnormal findings 06/10/2011  . Nephrolithiasis 12/07/2010  . Hypertensive crisis 12/07/2010  . SKIN LESION 03/01/2008  . ALLERGIC RHINITIS  10/13/2007  . Mixed hyperlipidemia 03/31/2007  . Overweight (BMI 25.0-29.9) 03/31/2007  . ANEMIA, CHRONIC DISEASE NEC 03/31/2007  . POLYP, ANAL AND RECTAL 03/31/2007  . BENIGN PROSTATIC HYPERTROPHY 03/31/2007  . DISC DISEASE, LUMBAR 03/31/2007    Orientation RESPIRATION BLADDER Height & Weight     Self (Can't understand speech to assess)  O2 (2L Nasal cannula) Incontinent,External catheter Weight: 237 lb 14 oz (107.9 kg) Height:  6' (182.9 cm)  BEHAVIORAL SYMPTOMS/MOOD NEUROLOGICAL BOWEL NUTRITION STATUS      Continent Feeding tube (Jevity 1.5)  AMBULATORY STATUS COMMUNICATION OF NEEDS Skin   Extensive Assist Verbally Normal                       Personal Care Assistance Level of Assistance  Bathing,Feeding,Dressing Bathing Assistance: Maximum assistance Feeding assistance: Limited assistance Dressing Assistance: Maximum assistance     Functional Limitations Info  Speech     Speech Info: Impaired    SPECIAL CARE FACTORS FREQUENCY  PT (By licensed PT),OT (By licensed OT),Speech therapy     PT Frequency: 5x/week OT Frequency: 5x/week     Speech Therapy Frequency: 5x/wk      Contractures Contractures Info: Not present    Additional Factors Info  Code Status,Allergies,Insulin Sliding Scale Code Status Info: Full Allergies Info: NKA   Insulin Sliding Scale Info: See dc summary       Current Medications (02/05/2021):  This is the current hospital active medication list Current Facility-Administered Medications  Medication Dose Route Frequency Provider Last Rate Last Admin  .  stroke: mapping our early stages of recovery book   Does not apply Once Khaliqdina, Salman,  MD      . acetaminophen (TYLENOL) tablet 650 mg  650 mg Oral Q4H PRN Donnetta Simpers, MD       Or  . acetaminophen (TYLENOL) 160 MG/5ML solution 650 mg  650 mg Per Tube Q4H PRN Donnetta Simpers, MD   650 mg at 02/03/21 2041   Or  . acetaminophen (TYLENOL) suppository 650 mg  650 mg Rectal  Q4H PRN Donnetta Simpers, MD   650 mg at 01/25/21 1125  . [START ON 02/07/2021] amiodarone (PACERONE) tablet 400 mg  400 mg Per Tube Daily Croitoru, Mihai, MD       Followed by  . [START ON 02/28/2021] amiodarone (PACERONE) tablet 200 mg  200 mg Per Tube Daily Croitoru, Mihai, MD      . amiodarone (PACERONE) tablet 400 mg  400 mg Per Tube BID Croitoru, Mihai, MD   400 mg at 02/05/21 0820  . amLODipine (NORVASC) tablet 10 mg  10 mg Per Tube Daily Bailey-Modzik, Delila A, NP   10 mg at 02/05/21 0821  . carvedilol (COREG) tablet 25 mg  25 mg Per Tube BID WC Rosalin Hawking, MD   25 mg at 02/05/21 9528  . chlorhexidine (PERIDEX) 0.12 % solution 15 mL  15 mL Mouth Rinse BID Garvin Fila, MD   15 mL at 02/05/21 0818  . Chlorhexidine Gluconate Cloth 2 % PADS 6 each  6 each Topical Daily Greta Doom, MD   6 each at 02/05/21 [phone]  . enoxaparin (LOVENOX) injection 40 mg  40 mg Subcutaneous Q24H Garvin Fila, MD   40 mg at 02/05/21 0819  . feeding supplement (JEVITY 1.5 CAL/FIBER) liquid 1,000 mL  1,000 mL Per Tube Continuous Garvin Fila, MD 60 mL/hr at 02/04/21 1436 1,000 mL at 02/04/21 1436  . feeding supplement (PROSource TF) liquid 45 mL  45 mL Per Tube TID Garvin Fila, MD   45 mL at 02/05/21 0821  . free water 100 mL  100 mL Per Tube Q4H Rosalin Hawking, MD   100 mL at 02/05/21 4401  . gabapentin (NEURONTIN) 250 MG/5ML solution 100 mg  100 mg Per Tube TID Garvin Fila, MD   100 mg at 02/04/21 2229  . insulin aspart (novoLOG) injection 3-9 Units  3-9 Units Subcutaneous Q4H Julian Hy, DO   9 Units at 02/05/21 0272  . insulin detemir (LEVEMIR) injection 25 Units  25 Units Subcutaneous BID Little Ishikawa, MD   25 Units at 02/05/21 [phone]  . losartan (COZAAR) tablet 50 mg  50 mg Per Tube BID Rosalin Hawking, MD   50 mg at 02/05/21 0827  . MEDLINE mouth rinse  15 mL Mouth Rinse q12n4p Garvin Fila, MD   15 mL at 02/04/21 1804  . pantoprazole sodium (PROTONIX) 40 mg/20 mL oral  suspension 40 mg  40 mg Per Tube QHS Alvira Philips, RPH   40 mg at 02/04/21 2128  . senna-docusate (Senokot-S) tablet 1 tablet  1 tablet Per Tube BID Donnetta Simpers, MD   1 tablet at 02/05/21 4403     Discharge Medications: Please see discharge summary for a list of discharge medications.  Relevant Imaging Results:  Relevant Lab Results:   Additional Information SS#: [id number]. Pfizer COVID-19 Vaccine 06/27/2020 , 11/09/2019 , 10/19/2019  Geralynn Ochs, LCSW

## 2021-02-06 ENCOUNTER — Inpatient Hospital Stay (HOSPITAL_COMMUNITY): Payer: Medicare HMO

## 2021-02-06 ENCOUNTER — Other Ambulatory Visit: Payer: Self-pay | Admitting: Internal Medicine

## 2021-02-06 DIAGNOSIS — I10 Essential (primary) hypertension: Secondary | ICD-10-CM | POA: Diagnosis not present

## 2021-02-06 DIAGNOSIS — L8990 Pressure ulcer of unspecified site, unspecified stage: Secondary | ICD-10-CM | POA: Insufficient documentation

## 2021-02-06 LAB — GLUCOSE, CAPILLARY
Glucose-Capillary: 121 mg/dL — ABNORMAL HIGH (ref 70–99)
Glucose-Capillary: 124 mg/dL — ABNORMAL HIGH (ref 70–99)
Glucose-Capillary: 119 mg/dL — ABNORMAL HIGH (ref 70–99)
Glucose-Capillary: 116 mg/dL — ABNORMAL HIGH (ref 70–99)
Glucose-Capillary: 154 mg/dL — ABNORMAL HIGH (ref 70–99)
Glucose-Capillary: 167 mg/dL — ABNORMAL HIGH (ref 70–99)

## 2021-02-06 LAB — CBC
HCT: 28.5 % — ABNORMAL LOW (ref 39.0–52.0)
Hemoglobin: 8.4 g/dL — ABNORMAL LOW (ref 13.0–17.0)
MCH: 25.9 pg — ABNORMAL LOW (ref 26.0–34.0)
MCHC: 29.5 g/dL — ABNORMAL LOW (ref 30.0–36.0)
MCV: 88 fL (ref 80.0–100.0)
Platelets: 296 10*3/uL (ref 150–400)
RBC: 3.24 MIL/uL — ABNORMAL LOW (ref 4.22–5.81)
RDW: 15.4 % (ref 11.5–15.5)
WBC: 9 10*3/uL (ref 4.0–10.5)
nRBC: 1 % — ABNORMAL HIGH (ref 0.0–0.2)

## 2021-02-06 LAB — BASIC METABOLIC PANEL
Anion gap: 5 (ref 5–15)
BUN: 27 mg/dL — ABNORMAL HIGH (ref 8–23)
CO2: 28 mmol/L (ref 22–32)
Calcium: 9.1 mg/dL (ref 8.9–10.3)
Chloride: 106 mmol/L (ref 98–111)
Creatinine, Ser: 1.07 mg/dL (ref 0.61–1.24)
GFR, Estimated: >60 mL/min (ref >60)
Glucose, Bld: 110 mg/dL — ABNORMAL HIGH (ref 70–99)
Potassium: 4.2 mmol/L (ref 3.5–5.1)
Sodium: 139 mmol/L (ref 135–145)

## 2021-02-06 MED ORDER — JUVEN PO PACK
1.0000 | PACK | Freq: Two times a day (BID) | ORAL | Status: DC
  Administered 2021-02-06 – 2021-02-28 (×44): 1
  Filled 2021-02-06 (×44): qty 1

## 2021-02-06 NOTE — Care Management Important Message (Signed)
Important Message  Patient Details  Name: Brian [name] MRN: [id number] Date of Birth: 17-Nov-1935   Medicare Important Message Given:  Yes     Shelda Altes 02/06/2021, 12:10 PM

## 2021-02-06 NOTE — Plan of Care (Signed)
  Problem: Health Behavior/Discharge Planning: Goal: Ability to manage health-related needs will improve Outcome: Not Progressing   Problem: Clinical Measurements: Goal: Ability to maintain clinical measurements within normal limits will improve Outcome: Progressing Goal: Will remain free from infection Outcome: Progressing Goal: Diagnostic test results will improve Outcome: Progressing Goal: Respiratory complications will improve Outcome: Not Progressing Goal: Cardiovascular complication will be avoided Outcome: Progressing   Problem: Nutrition: Goal: Adequate nutrition will be maintained Outcome: Progressing   Problem: Education: Goal: Knowledge of disease or condition will improve Outcome: Not Progressing Goal: Knowledge of secondary prevention will improve Outcome: Not Progressing Goal: Knowledge of patient specific risk factors addressed and post discharge goals established will improve Outcome: Not Progressing   Problem: Intracerebral Hemorrhage Tissue Perfusion: Goal: Complications of Intracerebral Hemorrhage will be minimized Outcome: Progressing   Problem: Education: Goal: Knowledge of disease or condition will improve Outcome: Not Progressing Goal: Knowledge of secondary prevention will improve Outcome: Not Progressing Goal: Knowledge of patient specific risk factors addressed and post discharge goals established will improve Outcome: Not Progressing Goal: Individualized Educational Video(s) Outcome: Not Progressing   Problem: Coping: Goal: Will verbalize positive feelings about self Outcome: Not Applicable Goal: Will identify appropriate support needs Outcome: Not Applicable   Problem: Health Behavior/Discharge Planning: Goal: Ability to manage health-related needs will improve Outcome: Progressing   Problem: Self-Care: Goal: Ability to participate in self-care as condition permits will improve Outcome: Not Progressing Goal: Verbalization of feelings  and concerns over difficulty with self-care will improve Outcome: Not Applicable Goal: Ability to communicate needs accurately will improve Outcome: Not Applicable   Problem: Nutrition: Goal: Risk of aspiration will decrease Outcome: Progressing Goal: Dietary intake will improve Outcome: Progressing   Problem: Intracerebral Hemorrhage Tissue Perfusion: Goal: Complications of Intracerebral Hemorrhage will be minimized Outcome: Progressing

## 2021-02-06 NOTE — Progress Notes (Signed)
Nutrition Follow-up  DOCUMENTATION CODES:  Not applicable  INTERVENTION:  Continue tube feeding via Cortrak: Jevity 1.5 at 60 ml/h (1440 ml per day) Prosource TF 45 ml TID 173m free water Q4H (per MD)  Provides 2280 kcal, 123 gm protein, 1094 ml free water daily (16934mtotal free water with flushes) Regimen may be continued via PEG if/when it is placed  -1 packet Juven BID, each packet provides 95 calories, 2.5 grams of protein (collagen), and 9.8 grams of carbohydrate (3 grams sugar); also contains 7 grams of L-arginine and L-glutamine, 300 mg vitamin C, 15 mg vitamin E, 1.2 mcg vitamin B-12, 9.5 mg zinc, 200 mg calcium, and 1.5 g  Calcium Beta-hydroxy-Beta-methylbutyrate to support wound healing  NUTRITION DIAGNOSIS:  Inadequate oral intake related to inability to eat as evidenced by NPO status. Ongoing.   GOAL:  Patient will meet greater than or equal to 90% of their needs Met.   MONITOR:  TF tolerance,Diet advancement  REASON FOR ASSESSMENT:  Consult Enteral/tube feeding initiation and management  ASSESSMENT:  Pt with PMH of Afib, DM, HLD, HTN, obesity, prior L MCA now admitted with ICH and associated cerebral edema in setting of hypertensive emergency and Apixaban therapy for Afib.   5/11 cortak placed, tip gastric 5/13 worsening lethargy started on 3% 5/14 developed Afib, followed by cardiology 5/18 tx to hospitalist service   Repeat imaging with what appears to be evolving ICH per Neurology with poor prognosis. Pt to have outpatient follow-up. Long term plan for disposition to SNF pending PEG placement.   Pt continues to tolerate TF via Cortrak per RN. Current regimen: Jevity 1.5 @ 6028mr w/ 84m21mosource TF TID and 100ml60me water Q4H. Regimen may be continued after PEG placement if/when PEG placement is agreed upon by pt's family.   No UOP documented x24 hours  Stage II pressure injury noted to L buttocks per RN assessment. Will initiate Juven BID via  Cortrak to aid in wound healing.   Medications: SSI, levemir, protonix, senokot-s Labs reviewed. CBGs: 121-124-119  Diet Order:   Diet Order            Diet NPO time specified  Diet effective now                EDUCATION NEEDS:  No education needs have been identified at this time  Skin:  Skin Assessment: Skin Integrity Issues: Skin Integrity Issues:: Stage II Stage II: L buttocks  Last BM:  5/22 type 6  Height:  Ht Readings from Last 1 Encounters:  01/23/21 6' (1.829 m)   Weight:  Wt Readings from Last 1 Encounters:  02/05/21 107.9 kg   Ideal Body Weight:  80.9 kg  BMI:  Body mass index is 32.26 kg/m.  Estimated Nutritional Needs:  Kcal:  2200-2400 Protein:  115-130 grams Fluid:  > 2 L/day   AmandLarkin Ina RD, LDN RD pager number and weekend/on-call pager number located in [place]

## 2021-02-06 NOTE — Progress Notes (Signed)
PROGRESS NOTE    Brian [name]  FIE:[id number] DOB: 1936-05-02 DOA: 01/23/2021 PCP: [name], MD   Brief Narrative:  Patient is 85 yo male admitted to [facility] on 01/23/2021 for acute onset right sided weakness and language deficit. Pertinent PMH of HTN, HLD, EF 45-50% on 2019, T2DM, Chronic anemia, Afib on Eliquis at home, prior L MCa Stroke. CT head w/o contrast and MRI on 5/9 showing L thalamic intracranial hemorrhage with cytoxic edema/with mild 31mm left to right shift. Patient was admitted with known Afib on anticoagulation with Apixaban which was reversed in ED with Andexanet Alpha. Also requiring Cleviprex infusion for hypertensive emergency. Admitted to Neuro ICU under the stroke service. Course complicated with ongoing HTN requiring Cleviprex infusion. Mentation initially somewhat improving. Patient developed fever with increased lethargy on 5/12. Failed swallow eval and core-track was placed. Cleviprex stopped on 5/12 and started on oral anti hypertensives. On 5/13 low grade fever persist with downtrending WBC. EEG shows focal left-sided slowing but no focal seizures.  Repeat imaging shows what appears to be evolving intracranial hemorrhaging per neurology with poor prognosis.  Transitioning out of the ICU, hospitalist called to take over care on 02/01/2021. Long term plan for disposition to SNF, pending PEG tube placement, for ongoing PT evaluation and care.  Assessment & Plan:   Active Problems:   Hypertensive crisis   ICH (intracerebral hemorrhage) (HCC)   SVT (supraventricular tachycardia) (HCC)   Pressure injury of skin  ICH previously on eliquis; s/p andexant alpha, POA History of ischemic stroke - Left thalamic ICH with 7 mm midline shift on initial imaging - Neurology signing off as of 02/03/2021-repeat imaging shows evolving intracranial hemorrhage on the 15th 16th and 18 of May -no further recommendations, outpatient follow-up with Dr. Leonie Man in 4 to 6 weeks - Peg tube placement  pending Endo suite availability, son and daughter both agreeable over the phone 5/23 -I explained risks and benefits and informed that one of them will need to sign the consent release verbally consent over the phone with procedure team - Stopped eliquis; reversed at admit - Statin ongoing  Hypertensive emergency, resolved - Stroke team signed off as above - SBP goal less than 160 -currently at goal - Continue coreg, losartan, amlodipine; PRN labetalol and hydralazine - Cardiology following - Aspiration precautions ongoing, extremely high risk for aspiration - transient respiratory symptoms over the past few days as below  Sustained atrial tachycardia, resolving - developed 2 episodes on 5/16 of SVT - Cardiology following, appreciate insights/recs - Continues on carvedilol, amiodarone  History of Atrial Fibrillation - Chronically anticoagulated on Eliquis, this was reversed on arrival  - Holding anticoagulation due to [place] - Follow rate control as above  Possible aspiration pneumonia Pneumonia resolved Aspiration (likely silent) likely ongoing given above - Completed 7 days of unasyn - Afebrile for >72 hours - Repeat CXR shows questionable atelectasis on personal review with no clear pneumonic process - Continue tube feeds via cortrak -we will likely need to transition to PEG tube feedings in the next week for more definitive solution - Aspiration precautions, NTS PRN  Diabetes type 2 -Continue Levemir 25u twice daily, continue sliding scale - Continue every 6 hour Accu-Chek; continue hypoglycemic protocol - CBG goal 140-180  Dysphagia secondary to mental status and stroke - Continue tube feeds via core track - transitioning to PEG as above pending availability - Aspiration precautions - SLP following - NTS PRN  Acute anemia likely due to critical illness (normocytic, hypochromic) -  Transfuse for Hb <7 or hemodynamically significant bleeding    DVT prophylaxis: None  given intracranial hemorrhage as above Code Status: Full Family Communication: Daughter Ennis Forts and son [name] updated over the phone today -both verbally consenting for PEG tube placement  Status is: Inpatient  Dispo: The patient is from: Home              Anticipated d/c is to: TBD - likely SNF given current level of care needed/PT              Anticipated d/c date is: >72h              Patient currently not medically stable for discharge  Consultants:   Cards, Neuro, PCCM  Procedures:   None  Antimicrobials:  Completed Unasyn as above  Subjective: No acute issues or events overnight, review of systems remains markedly limited, patient remains poorly interactive  Objective: Vitals:   02/05/21 1944 02/05/21 2348 02/06/21 0413 02/06/21 0734  BP: (!) 150/67 (!) 149/78 (!) 157/63 (!) 157/89  Pulse: (!) 59 66 67 71  Resp: 19 18 19    Temp: 98.6 F (37 C) 98.9 F (37.2 C) 99.8 F (37.7 C)   TempSrc: Oral Oral Oral   SpO2: 99% 96% 97% 97%  Weight:      Height:        Intake/Output Summary (Last 24 hours) at 02/06/2021 0740 Last data filed at 02/06/2021 0428 Gross per 24 hour  Intake 360 ml  Output --  Net 360 ml   Filed Weights   02/03/21 0449 02/04/21 0444 02/05/21 0500  Weight: 106.3 kg 107.3 kg 107.9 kg    Examination:  General:  Pleasantly resting in bed, No acute distress. Awake and more alert today - tracks motion/voice but does not appear to follow any commands or interact in any meaningful way HEENT: Pinpoint minimally reactive pupils, minimal reflex, core track in place Neck:  Without mass or deformity.  Trachea is midline. Lungs: Coarse breath sounds bilaterally without overt wheeze or rales Heart:  Regular rate and rhythm.  Without murmurs, rubs, or gallops. Abdomen:  Soft, nontender, nondistended.  Without guarding or rebound. Extremities: Right upper/lower extremity flaccid, moving left upper extremity frequently at will but without purpose;  occasionally moves LLE - difficult to measure strength given inability to follow even simple commands. Vascular:  Dorsalis pedis and posterior tibial pulses palpable bilaterally. Skin:  Warm and dry, no erythema, no ulcerations.   Data Reviewed: I have personally reviewed following labs and imaging studies  CBC: Recent Labs  Lab 01/30/21 0748 01/31/21 0645 02/01/21 0036 02/03/21 0500 02/06/21 0516  WBC 8.7 9.5 11.3* 8.6 9.0  HGB 10.6* 11.3* 9.6* 8.8* 8.4*  HCT 35.5* 38.8* 33.6* 30.3* 28.5*  MCV 87.9 87.6 90.1 89.4 88.0  PLT 216 171 231 265 188   Basic Metabolic Panel: Recent Labs  Lab 01/30/21 0748 01/30/21 1246 01/31/21 0125 01/31/21 0645 02/01/21 0036 02/03/21 0500 02/06/21 0516  NA 153*   < > 152* 153* 150* 144 139  K 3.8  --   --  4.0 3.9 3.9 4.2  CL 120*  --   --  116* 119* 110 106  CO2 24  --   --  26 26 28 28   GLUCOSE 101*  --   --  73 159* 159* 110*  BUN 27*  --   --  35* 36* 31* 27*  CREATININE 0.95  --   --  1.08 1.09 1.14 1.07  CALCIUM  9.7  --   --  9.5 9.3 8.8* 9.1   < > = values in this interval not displayed.   GFR: Estimated Creatinine Clearance: 66.4 mL/min (by C-G formula based on SCr of 1.07 mg/dL). Liver Function Tests: No results for input(s): AST, ALT, ALKPHOS, BILITOT, PROT, ALBUMIN in the last 168 hours. No results for input(s): LIPASE, AMYLASE in the last 168 hours. No results for input(s): AMMONIA in the last 168 hours. Coagulation Profile: No results for input(s): INR, PROTIME in the last 168 hours. Cardiac Enzymes: No results for input(s): CKTOTAL, CKMB, CKMBINDEX, TROPONINI in the last 168 hours. BNP (last 3 results) No results for input(s): PROBNP in the last 8760 hours. HbA1C: No results for input(s): HGBA1C in the last 72 hours. CBG: Recent Labs  Lab 02/05/21 1552 02/05/21 1942 02/05/21 2349 02/06/21 0410 02/06/21 0734  GLUCAP 199* 172* 158* 121* 124*   Lipid Profile: No results for input(s): CHOL, HDL, LDLCALC, TRIG,  CHOLHDL, LDLDIRECT in the last 72 hours. Thyroid Function Tests: No results for input(s): TSH, T4TOTAL, FREET4, T3FREE, THYROIDAB in the last 72 hours. Anemia Panel: No results for input(s): VITAMINB12, FOLATE, FERRITIN, TIBC, IRON, RETICCTPCT in the last 72 hours. Sepsis Labs: No results for input(s): PROCALCITON, LATICACIDVEN in the last 168 hours.  No results found for this or any previous visit (from the past 240 hour(s)).   Radiology Studies: No results found. Scheduled Meds: .  stroke: mapping our early stages of recovery book   Does not apply Once  . [START ON 02/07/2021] amiodarone  400 mg Per Tube Daily   Followed by  . [START ON 02/28/2021] amiodarone  200 mg Per Tube Daily  . amiodarone  400 mg Per Tube BID  . amLODipine  10 mg Per Tube Daily  . carvedilol  25 mg Per Tube BID WC  . chlorhexidine  15 mL Mouth Rinse BID  . Chlorhexidine Gluconate Cloth  6 each Topical Daily  . enoxaparin (LOVENOX) injection  40 mg Subcutaneous Q24H  . feeding supplement (PROSource TF)  45 mL Per Tube TID  . free water  100 mL Per Tube Q4H  . gabapentin  100 mg Per Tube TID  . insulin aspart  3-9 Units Subcutaneous Q4H  . insulin detemir  25 Units Subcutaneous BID  . losartan  50 mg Per Tube BID  . mouth rinse  15 mL Mouth Rinse q12n4p  . pantoprazole sodium  40 mg Per Tube QHS  . senna-docusate  1 tablet Per Tube BID   Continuous Infusions: . feeding supplement (JEVITY 1.5 CAL/FIBER) 1,000 mL (02/06/21 0055)     LOS: 14 days   Time spent: 56min  Kameko Hukill C Angel Weedon, DO [facility]  If 7PM-7AM, please contact night-coverage [email or web address]  02/06/2021, 7:40 AM

## 2021-02-06 NOTE — Progress Notes (Signed)
Physical Therapy Treatment Patient Details Name: Brian [name] MRN: [id number] DOB: Jan 09, 1936 Today's Date: 02/06/2021    History of Present Illness Pt is a 85 y.o. M who presents with acute onset R sided weakness, facial droop, numbness, aphasia with left gaze preference. CTH showing L thalamic ICH. Significant PMH: afib, DM2, HLD, HTN, stroke.    PT Comments    Patient continues to be limited by impaired communication and cognition. Not following commands and no verbalizations this session. Patient continues to require totalA+2 for bed mobility with no initiation noted. Requires totalA to maintain sitting balance. Worked on lateral propping on elbows. Continue to recommend SNF for ongoing Physical Therapy.       Follow Up Recommendations  SNF     Equipment Recommendations  Other (comment) (defer)    Recommendations for Other Services       Precautions / Restrictions Precautions Precautions: Fall;Other (comment) Precaution Comments: R hemiparesis, global aphasia, cortrak Restrictions Weight Bearing Restrictions: No    Mobility  Bed Mobility Overal bed mobility: Needs Assistance Bed Mobility: Sit to Supine;Supine to Sit     Supine to sit: Total assist;+2 for physical assistance;+2 for safety/equipment Sit to supine: Total assist;+2 for physical assistance;+2 for safety/equipment   General bed mobility comments: totalA+2 for all aspects of bed mobility with no attempts to initiate movement.    Transfers                 General transfer comment: deferred due to poor sitting balance  Ambulation/Gait                 Stairs             Wheelchair Mobility    Modified Rankin (Stroke Patients Only) Modified Rankin (Stroke Patients Only) Pre-Morbid Rankin Score: Slight disability Modified Rankin: Severe disability     Balance Overall balance assessment: Needs assistance Sitting-balance support: Feet supported;Single extremity  supported Sitting balance-Leahy Scale: Zero Sitting balance - Comments: totalA to maintain sitting EOB. Pushing posteiorly at times but improved following lateral lean on L elbow Postural control: Posterior lean;Right lateral lean                                  Cognition Arousal/Alertness: Lethargic Behavior During Therapy: Flat affect Overall Cognitive Status: Difficult to assess Area of Impairment: Following commands                       Following Commands: Follows one step commands inconsistently       General Comments: Not following commands this session. No verbalizations noted      Exercises Other Exercises Other Exercises: PROM cervical rotation and extension Other Exercises: Lateral propping on L and R elbow to promote WBing    General Comments        Pertinent Vitals/Pain Pain Assessment: Faces Faces Pain Scale: No hurt Pain Intervention(s): Monitored during session    Home Living                      Prior Function            PT Goals (current goals can now be found in the care plan section) Acute Rehab PT Goals Patient Stated Goal: none stated PT Goal Formulation: Patient unable to participate in goal setting Time For Goal Achievement: 02/07/21 Potential to Achieve Goals: Fair Progress towards PT goals: Not progressing  toward goals - comment    Frequency    Min 3X/week      PT Plan Current plan remains appropriate    Co-evaluation PT/OT/SLP Co-Evaluation/Treatment: Yes Reason for Co-Treatment: Complexity of the patient's impairments (multi-system involvement);Necessary to address cognition/behavior during functional activity;For patient/therapist safety;To address functional/ADL transfers PT goals addressed during session: Mobility/safety with mobility;Balance        AM-PAC PT "6 Clicks" Mobility   Outcome Measure  Help needed turning from your back to your side while in a flat bed without using  bedrails?: Total Help needed moving from lying on your back to sitting on the side of a flat bed without using bedrails?: Total Help needed moving to and from a bed to a chair (including a wheelchair)?: Total Help needed standing up from a chair using your arms (e.g., wheelchair or bedside chair)?: Total Help needed to walk in hospital room?: Total Help needed climbing 3-5 steps with a railing? : Total 6 Click Score: 6    End of Session Equipment Utilized During Treatment: Gait belt Activity Tolerance: Patient tolerated treatment well Patient left: in bed;with call bell/phone within reach;with bed alarm set Nurse Communication: Mobility status PT Visit Diagnosis: Other abnormalities of gait and mobility (R26.89);Hemiplegia and hemiparesis Hemiplegia - Right/Left: Right Hemiplegia - dominant/non-dominant: Dominant Hemiplegia - caused by: Nontraumatic intracerebral hemorrhage     Time: 7096-2836 PT Time Calculation (min) (ACUTE ONLY): 26 min  Charges:  $Therapeutic Activity: 8-22 mins                     Brittanyann Wittner A. Gilford Rile PT, DPT Acute Rehabilitation Services Pager [phone] Office [phone]    Linna Hoff 02/06/2021, 2:44 PM

## 2021-02-06 NOTE — Progress Notes (Signed)
Occupational Therapy Treatment Patient Details Name: Brian [name] MRN: [id number] DOB: 07-Apr-1936 Today's Date: 02/06/2021    History of present illness Pt is a 85 y.o. M who presents with acute onset R sided weakness, facial droop, numbness, aphasia with left gaze preference. CTH showing L thalamic ICH. Significant PMH: afib, DM2, HLD, HTN, stroke.   OT comments  Patient supine in bed seen for OT/PT session. Patient remains total assist +2 for bed mobility, total assist for sitting EOB pushing with L UE and only improving briefly after lateral leaning on L elbow.  Patient spontaneously moving LUE throughout session or pushing with UE on bed.  No verbalizations during session and not following commands.  PROM to R UE provided, donned resting hand splint and elevated UE.  Will follow acutely.     Follow Up Recommendations  SNF;Supervision/Assistance - 24 hour    Equipment Recommendations  Wheelchair (measurements OT);Wheelchair cushion (measurements OT);Hospital bed    Recommendations for Other Services      Precautions / Restrictions Precautions Precautions: Fall;Other (comment) Precaution Comments: R hemiparesis, global aphasia, cortrak Restrictions Weight Bearing Restrictions: No       Mobility Bed Mobility Overal bed mobility: Needs Assistance Bed Mobility: Sit to Supine;Supine to Sit     Supine to sit: Total assist;+2 for physical assistance;+2 for safety/equipment Sit to supine: Total assist;+2 for physical assistance;+2 for safety/equipment   General bed mobility comments: totalA+2 for all aspects of bed mobility with no attempts to initiate movement.    Transfers                 General transfer comment: deferred due to poor sitting balance    Balance Overall balance assessment: Needs assistance Sitting-balance support: Feet supported;Single extremity supported Sitting balance-Leahy Scale: Zero Sitting balance - Comments: totalA to maintain sitting EOB.  Pushing posteiorly at times but improved following lateral lean on L elbow Postural control: Posterior lean;Right lateral lean                                 ADL either performed or assessed with clinical judgement   ADL Overall ADL's : Needs assistance/impaired                                     Functional mobility during ADLs: Total assistance;+2 for physical assistance;+2 for safety/equipment General ADL Comments: total assist for all ADLs, pt very figidety with L UE today--if not pushing on bed does not attend to or engage in [facility] tasks     Vision   Additional Comments: eyes open in sitting, fatigues easily. L gaze preference and does not pass midline   Perception     Praxis      Cognition Arousal/Alertness: Lethargic Behavior During Therapy: Flat affect Overall Cognitive Status: Difficult to assess Area of Impairment: Following commands                       Following Commands: Follows one step commands inconsistently       General Comments: Not following commands this session. No verbalizations noted        Exercises Exercises: Other exercises Other Exercises Other Exercises: PROM cervical rotation and extension Other Exercises: Lateral propping on L and R elbow to promote WBing   Shoulder Instructions       General Comments PROM  of R UE from shoulder to hand, donned resting hand splint and elevated UE.    Pertinent Vitals/ Pain       Pain Assessment: Faces Faces Pain Scale: No hurt Pain Intervention(s): Monitored during session  Home Living                                          Prior Functioning/Environment              Frequency  Min 2X/week        Progress Toward Goals  OT Goals(current goals can now be found in the care plan section)  Progress towards OT goals: Not progressing toward goals - comment (not following commands remains total assist)  Acute Rehab OT Goals Patient  Stated Goal: none stated OT Goal Formulation: Patient unable to participate in goal setting  Plan Discharge plan remains appropriate;Frequency remains appropriate    Co-evaluation    PT/OT/SLP Co-Evaluation/Treatment: Yes Reason for Co-Treatment: Complexity of the patient's impairments (multi-system involvement);Necessary to address cognition/behavior during functional activity;For patient/therapist safety;To address functional/ADL transfers PT goals addressed during session: Mobility/safety with mobility;Balance OT goals addressed during session: ADL's and self-care      AM-PAC OT "6 Clicks" Daily Activity     Outcome Measure   Help from another person eating meals?: Total Help from another person taking care of personal grooming?: Total Help from another person toileting, which includes using toliet, bedpan, or urinal?: Total Help from another person bathing (including washing, rinsing, drying)?: Total Help from another person to put on and taking off regular upper body clothing?: Total Help from another person to put on and taking off regular lower body clothing?: Total 6 Click Score: 6    End of Session Equipment Utilized During Treatment: Oxygen  OT Visit Diagnosis: Unsteadiness on feet (R26.81);Muscle weakness (generalized) (M62.81);Other symptoms and signs involving cognitive function;Hemiplegia and hemiparesis Hemiplegia - Right/Left: Right Hemiplegia - dominant/non-dominant: Dominant Hemiplegia - caused by: Nontraumatic intracerebral hemorrhage   Activity Tolerance Patient tolerated treatment well   Patient Left in bed;with call bell/phone within reach;with bed alarm set   Nurse Communication Mobility status;Precautions        Time: 1610-9604 OT Time Calculation (min): 26 min  Charges: OT General Charges $OT Visit: 1 Visit OT Treatments $Self Care/Home Management : 8-22 mins  Jolaine Artist, OT [place] Pager [phone] Office  [place] 02/06/2021, 3:39 PM

## 2021-02-07 DIAGNOSIS — I169 Hypertensive crisis, unspecified: Secondary | ICD-10-CM | POA: Diagnosis not present

## 2021-02-07 DIAGNOSIS — I619 Nontraumatic intracerebral hemorrhage, unspecified: Secondary | ICD-10-CM | POA: Diagnosis not present

## 2021-02-07 DIAGNOSIS — I471 Supraventricular tachycardia: Secondary | ICD-10-CM | POA: Diagnosis not present

## 2021-02-07 LAB — GLUCOSE, CAPILLARY
Glucose-Capillary: 129 mg/dL — ABNORMAL HIGH (ref 70–99)
Glucose-Capillary: 143 mg/dL — ABNORMAL HIGH (ref 70–99)
Glucose-Capillary: 177 mg/dL — ABNORMAL HIGH (ref 70–99)
Glucose-Capillary: 197 mg/dL — ABNORMAL HIGH (ref 70–99)
Glucose-Capillary: 98 mg/dL (ref 70–99)

## 2021-02-07 MED ORDER — CEFAZOLIN SODIUM-DEXTROSE 2-4 GM/100ML-% IV SOLN: 2.0000 g | INTRAVENOUS | Status: DC

## 2021-02-07 MED ORDER — CEFAZOLIN SODIUM-DEXTROSE 2-4 GM/100ML-% IV SOLN
2.0000 g | INTRAVENOUS | Status: AC
  Administered 2021-02-08: 2 g via INTRAVENOUS
  Filled 2021-02-07: qty 100

## 2021-02-07 MED ORDER — ENOXAPARIN SODIUM 40 MG/0.4ML IJ SOSY
40.0000 mg | PREFILLED_SYRINGE | INTRAMUSCULAR | Status: DC
  Administered 2021-02-09 – 2021-02-28 (×20): 40 mg via SUBCUTANEOUS
  Filled 2021-02-07 (×19): qty 0.4

## 2021-02-07 NOTE — Plan of Care (Signed)
  Problem: Health Behavior/Discharge Planning: Goal: Ability to manage health-related needs will improve Outcome: Progressing   Problem: Clinical Measurements: Goal: Ability to maintain clinical measurements within normal limits will improve Outcome: Progressing Goal: Will remain free from infection Outcome: Progressing Goal: Diagnostic test results will improve Outcome: Progressing Goal: Respiratory complications will improve Outcome: Progressing Goal: Cardiovascular complication will be avoided Outcome: Progressing   Problem: Nutrition: Goal: Adequate nutrition will be maintained Outcome: Progressing   Problem: Education: Goal: Knowledge of disease or condition will improve Outcome: Progressing Goal: Knowledge of secondary prevention will improve Outcome: Progressing Goal: Knowledge of patient specific risk factors addressed and post discharge goals established will improve Outcome: Progressing   Problem: Intracerebral Hemorrhage Tissue Perfusion: Goal: Complications of Intracerebral Hemorrhage will be minimized Outcome: Progressing   Problem: Education: Goal: Knowledge of disease or condition will improve Outcome: Progressing Goal: Knowledge of secondary prevention will improve Outcome: Progressing Goal: Knowledge of patient specific risk factors addressed and post discharge goals established will improve Outcome: Progressing Goal: Individualized Educational Video(s) Outcome: Progressing   Problem: Health Behavior/Discharge Planning: Goal: Ability to manage health-related needs will improve Outcome: Progressing   Problem: Self-Care: Goal: Ability to participate in self-care as condition permits will improve Outcome: Progressing   Problem: Nutrition: Goal: Risk of aspiration will decrease Outcome: Progressing Goal: Dietary intake will improve Outcome: Progressing   Problem: Intracerebral Hemorrhage Tissue Perfusion: Goal: Complications of Intracerebral  Hemorrhage will be minimized Outcome: Progressing

## 2021-02-07 NOTE — Progress Notes (Signed)
PROGRESS NOTE    Brian [name]  ZMO:[id number] DOB: 1936-07-27 DOA: 01/23/2021 PCP: [name], MD   Brief Narrative:  Patient is 85 yo male admitted to [facility] on 01/23/2021 for acute onset right sided weakness and language deficit. Pertinent PMH of HTN, HLD, EF 45-50% on 2019, T2DM, Chronic anemia, Afib on Eliquis at home, prior L MCa Stroke. CT head w/o contrast and MRI on 5/9 showing L thalamic intracranial hemorrhage with cytoxic edema/with mild 60mm left to right shift. Patient was admitted with known Afib on anticoagulation with Apixaban which was reversed in ED with Andexanet Alpha. Also requiring Cleviprex infusion for hypertensive emergency. Admitted to Neuro ICU under the stroke service. Course complicated with ongoing HTN requiring Cleviprex infusion. Mentation initially somewhat improving. Patient developed fever with increased lethargy on 5/12. Failed swallow eval and core-track was placed. Cleviprex stopped on 5/12 and started on oral anti hypertensives. On 5/13 low grade fever persist with downtrending WBC. EEG shows focal left-sided slowing but no focal seizures.  Repeat imaging shows what appears to be evolving intracranial hemorrhaging per neurology with poor prognosis.  Transitioning out of the ICU, hospitalist called to take over care on 02/01/2021. Long term plan for disposition to SNF, pending PEG tube placement on 02/08/2021, continue ongoing PT evaluation and care -likely transition to SNF in the next 24 to 48 hours pending bed availability and tolerance of PEG tube placement given his stable nature his prognosis remains somewhat poor, family is aware, son and daughter have been updated about plan for PEG tube and disposition this week.  Assessment & Plan:   Active Problems:   Hypertensive crisis   ICH (intracerebral hemorrhage) (HCC)   SVT (supraventricular tachycardia) (HCC)   Pressure injury of skin  ICH previously on eliquis; s/p andexant alpha, POA History of ischemic  stroke - Left thalamic ICH with 7 mm midline shift on initial imaging - Neurology signing off as of 02/03/2021-repeat imaging shows evolving intracranial hemorrhage on the 15th 16th and 18 of May -no further recommendations, outpatient follow-up with Dr. Leonie Man in 4 to 6 weeks - Peg tube placement planned 02/08/2021, family agreeable - Stopped eliquis; reversed at admit - Statin ongoing  Hypertensive emergency, resolved - Stroke team signed off as above - SBP goal less than 160 -currently at goal - Continue coreg, losartan, amlodipine; PRN labetalol and hydralazine - Cardiology following - Aspiration precautions ongoing, extremely high risk for aspiration - transient respiratory symptoms over the past few days as below  Sustained atrial tachycardia, resolving - developed 2 episodes on 5/16 of SVT - Cardiology following, appreciate insights/recs - Continues on carvedilol, amiodarone  History of Atrial Fibrillation - Chronically anticoagulated on Eliquis, this was reversed on arrival  - Holding anticoagulation due to [place] - Follow rate control as above  Possible aspiration pneumonia Pneumonia resolved Aspiration (probably silent) likely ongoing given above - Completed 7 days of unasyn - Afebrile for >72 hours - Repeat CXR shows questionable atelectasis on personal review with no clear pneumonic process - Continue tube feeds via cortrak until transition to G tube - Aspiration precautions, NTS PRN  Diabetes type 2 - Continue Levemir 25u twice daily, continue sliding scale - Continue every 6 hour Accu-Chek; continue hypoglycemic protocol - CBG goal 140-180  Dysphagia secondary to mental status and stroke -Hold tube feeds at midnight for G-tube placement in the morning, resume tube feeds once stable per IR - Aspiration precautions - SLP following - NTS PRN  Acute anemia likely due to  critical illness (normocytic, hypochromic) - Transfuse for Hb <7 or hemodynamically  significant bleeding    DVT prophylaxis: None given intracranial hemorrhage as above Code Status: Full Family Communication: Daughter Ennis Forts and son [name] updated over the phone today -both verbally consenting for definitive feeding tube placement  Status is: Inpatient  Dispo: The patient is from: Home              Anticipated d/c is to: TBD - likely SNF given current level of care needed/PT              Anticipated d/c date is: 24 to 48 hours              Patient currently not medically stable for discharge  Consultants:   Cards, Neuro, PCCM  Procedures:   None  Antimicrobials:  Completed Unasyn as above  Subjective: No acute issues or events overnight, review of systems remains markedly limited, patient remains poorly interactive  Objective: Vitals:   02/06/21 1925 02/06/21 2320 02/07/21 0334 02/07/21 0446  BP: 139/61 (!) 150/71 (!) 164/68   Pulse: 60 65 63   Resp: 17 17 19    Temp: 98.3 F (36.8 C) 98.5 F (36.9 C) 98.8 F (37.1 C)   TempSrc: Axillary Axillary Oral   SpO2: 99% 100% 98%   Weight:    103.3 kg  Height:        Intake/Output Summary (Last 24 hours) at 02/07/2021 0745 Last data filed at 02/06/2021 2251 Gross per 24 hour  Intake --  Output 450 ml  Net -450 ml   Filed Weights   02/04/21 0444 02/05/21 0500 02/07/21 0446  Weight: 107.3 kg 107.9 kg 103.3 kg    Examination:  General:  Pleasantly resting in bed, No acute distress. Awake and more alert today - tracks motion/voice but does not appear to follow any commands or interact in any meaningful way HEENT: Pinpoint minimally reactive pupils, minimal reflex, core track in place Neck:  Without mass or deformity.  Trachea is midline. Lungs: Coarse breath sounds bilaterally without overt wheeze or rales Heart:  Regular rate and rhythm.  Without murmurs, rubs, or gallops. Abdomen:  Soft, nontender, nondistended.  Without guarding or rebound. Extremities: Right upper/lower extremity flaccid, moving  left upper extremity frequently at will but without purpose; occasionally moves LLE - difficult to measure strength given inability to follow even simple commands. Vascular:  Dorsalis pedis and posterior tibial pulses palpable bilaterally. Skin:  Warm and dry, no erythema, no ulcerations.   Data Reviewed: I have personally reviewed following labs and imaging studies  CBC: Recent Labs  Lab 02/01/21 0036 02/03/21 0500 02/06/21 0516  WBC 11.3* 8.6 9.0  HGB 9.6* 8.8* 8.4*  HCT 33.6* 30.3* 28.5*  MCV 90.1 89.4 88.0  PLT 231 265 921   Basic Metabolic Panel: Recent Labs  Lab 02/01/21 0036 02/03/21 0500 02/06/21 0516  NA 150* 144 139  K 3.9 3.9 4.2  CL 119* 110 106  CO2 26 28 28   GLUCOSE 159* 159* 110*  BUN 36* 31* 27*  CREATININE 1.09 1.14 1.07  CALCIUM 9.3 8.8* 9.1   GFR: Estimated Creatinine Clearance: 65 mL/min (by C-G formula based on SCr of 1.07 mg/dL). Liver Function Tests: No results for input(s): AST, ALT, ALKPHOS, BILITOT, PROT, ALBUMIN in the last 168 hours. No results for input(s): LIPASE, AMYLASE in the last 168 hours. No results for input(s): AMMONIA in the last 168 hours. Coagulation Profile: No results for input(s): INR, PROTIME in the last  168 hours. Cardiac Enzymes: No results for input(s): CKTOTAL, CKMB, CKMBINDEX, TROPONINI in the last 168 hours. BNP (last 3 results) No results for input(s): PROBNP in the last 8760 hours. HbA1C: No results for input(s): HGBA1C in the last 72 hours. CBG: Recent Labs  Lab 02/06/21 1228 02/06/21 1547 02/06/21 1928 02/06/21 2312 02/07/21 0420  GLUCAP 119* 116* 154* 167* 129*   Lipid Profile: No results for input(s): CHOL, HDL, LDLCALC, TRIG, CHOLHDL, LDLDIRECT in the last 72 hours. Thyroid Function Tests: No results for input(s): TSH, T4TOTAL, FREET4, T3FREE, THYROIDAB in the last 72 hours. Anemia Panel: No results for input(s): VITAMINB12, FOLATE, FERRITIN, TIBC, IRON, RETICCTPCT in the last 72 hours. Sepsis  Labs: No results for input(s): PROCALCITON, LATICACIDVEN in the last 168 hours.  No results found for this or any previous visit (from the past 240 hour(s)).   Radiology Studies: CT ABDOMEN WO CONTRAST  Result Date: 02/06/2021 CLINICAL DATA:  Evaluate anatomy for G-tube placement EXAM: CT ABDOMEN WITHOUT CONTRAST TECHNIQUE: Multidetector CT imaging of the abdomen was performed following the standard protocol without IV contrast. COMPARISON:  None. FINDINGS: Lower chest: No acute abnormality Hepatobiliary: No focal liver abnormality is seen. No gallstones, gallbladder wall thickening, or biliary dilatation. Pancreas: Pancreas is diffusely atrophic. Spleen: Normal in size without focal abnormality. Adrenals/Urinary Tract: Adrenal glands are normal. 1.4 cm nonobstructing calculus seen in the lower pole of the right kidney. The kidneys in visualized ureteral segment otherwise unremarkable. Stomach/Bowel: Nasogastric tube terminates within the gastric lumen. There is a calcified mass along the posterior fundal wall measuring 2.0 x 1.5 x 1.8 cm. Visualized small and large bowel normal in caliber and distribution. Vascular/Lymphatic: No enlarged retroperitoneal or or mesenteric lymph nodes. Scattered atheromatous plaque of the abdominal aorta and visualized branches. Other: No abdominal wall hernia or abnormality. Musculoskeletal: No acute or significant osseous findings. IMPRESSION: 1. Gastric anatomy is amenable to percutaneous gastrostomy tube placement. 2. 2.0 x 1.8 x 1.5 cm high density structure noted along the posterior gastric fundus may represent a diverticulum containing oral contrast or alternatively could represent a calcified gastric neoplasm such as GIST. Follow-up contrast enhance abdominal CT should be performed in 2-3 months to again evaluate this structure. 3. 1.4 cm nonobstructing calculus seen in the lower pole the right kidney. Electronically Signed   By: Miachel Roux M.D.   On: 02/06/2021  16:18   DG Abd Portable 1V  Result Date: 02/06/2021 CLINICAL DATA:  Intracranial hemorrhage and assessment for possible gastrostomy tube placement. EXAM: PORTABLE ABDOMEN - 1 VIEW COMPARISON:  Prior abdominal film at [facility] dated 11/08/2011 FINDINGS: High position decompressed stomach contains a feeding tube. Difficult to exclude component of hiatal hernia. Mild gaseous prominence of a redundant colon with transverse colon and splenic flexure located very near the decompressed stomach. No evidence of small-bowel obstruction. Calcification in the right abdomen was present previously and likely represents a lower pole renal calculus. Stable advanced degenerative disease of the lower lumbosacral spine. IMPRESSION: High position decompressed stomach containing feeding tube. Difficult to exclude component of hiatal hernia. Redundant colon demonstrates mild gaseous prominence with the transverse colon and splenic flexure located near the decompressed stomach. Prior to considering percutaneous versus surgical gastrostomy, a CT of the abdomen would be helpful to better evaluate anatomy. Electronically Signed   By: Aletta Edouard M.D.   On: 02/06/2021 08:51   Scheduled Meds: .  stroke: mapping our early stages of recovery book   Does not apply Once  . amiodarone  400 mg Per Tube Daily   Followed by  . [START ON 02/28/2021] amiodarone  200 mg Per Tube Daily  . amLODipine  10 mg Per Tube Daily  . carvedilol  25 mg Per Tube BID WC  . chlorhexidine  15 mL Mouth Rinse BID  . Chlorhexidine Gluconate Cloth  6 each Topical Daily  . enoxaparin (LOVENOX) injection  40 mg Subcutaneous Q24H  . feeding supplement (PROSource TF)  45 mL Per Tube TID  . free water  100 mL Per Tube Q4H  . gabapentin  100 mg Per Tube TID  . insulin aspart  3-9 Units Subcutaneous Q4H  . insulin detemir  25 Units Subcutaneous BID  . losartan  50 mg Per Tube BID  . mouth rinse  15 mL Mouth Rinse q12n4p  . nutrition supplement  (JUVEN)  1 packet Per Tube BID BM  . pantoprazole sodium  40 mg Per Tube QHS  . senna-docusate  1 tablet Per Tube BID   Continuous Infusions: . feeding supplement (JEVITY 1.5 CAL/FIBER) 1,000 mL (02/06/21 2123)     LOS: 15 days   Time spent: 73min  Zaid Tomes C Eulises Kijowski, DO [facility]  If 7PM-7AM, please contact night-coverage [email or web address]  02/07/2021, 7:45 AM

## 2021-02-07 NOTE — Consult Note (Signed)
Chief Complaint: Patient was seen in consultation today for percutaneous gastric tube placement Chief Complaint  Patient presents with  . Code Stroke   at the request of Dr Reola Mosher   Supervising Physician: Corrie Mckusick  Patient Status: [facility] - In-pt  History of Present Illness: Brian [name] is a 85 y.o. male   Hx CVA New CVA 01/23/21 HTN; HLD; DM; Afib- Eliquis- off now 5/9 CT revealing L thalamic intracranial hemorrhage Failed swallow test and Coretrak in place Repeat imaging shows what appears to be evolving intracranial hemorrhaging per neurology with poor prognosis. Long term plan is Long term care-- to SNF Dysphagia Aspiration - high risk  Request made for percutaneous gastric tube per [facility] Plan in IR tentative for 5/25   Past Medical History:  Diagnosis Date  . ALLERGIC RHINITIS 10/13/2007  . ANEMIA, CHRONIC DISEASE NEC 03/31/2007  . BENIGN PROSTATIC HYPERTROPHY 03/31/2007  . Chest pain    Stress echo, normal, December, 2012  . Chronic anemia   . DIABETES MELLITUS, TYPE II 03/31/2007  . [place] DISEASE, LUMBAR 03/31/2007  . ED (erectile dysfunction)   . Ejection fraction    EF  normal, stress echo, December,  2012  . History of prostatitis   . HYPERLIPIDEMIA 03/31/2007  . HYPERTENSION 03/31/2007  . Nephrolithiasis 12/07/2010  . Obesity   . POLYP, ANAL AND RECTAL 03/31/2007  . Right eye trauma    hx as child  . Stroke [facility])     Past Surgical History:  Procedure Laterality Date  . [place] SURGERY     1999  . PROSTATE BIOPSY     s/p  . RECTAL POLYPECTOMY     Transanal excision 10/2005  . SKIN BIOPSY     s/p right upper back 2009- benign Dr. Tonia Brooms    Allergies: Patient has no known allergies.  Medications: Prior to Admission medications   Medication Sig Start Date End Date Taking? Authorizing Provider  acetaminophen (TYLENOL) 325 MG tablet Take 2 tablets (650 mg total) by mouth every 4 (four) hours as needed for mild pain or fever (or temp >  37.5 C (99.5 F)). 06/27/18  Yes Emokpae, Courage, MD  amLODipine (NORVASC) 10 MG tablet TAKE 1 TABLET BY MOUTH EVERY DAY Patient taking differently: Take 10 mg by mouth daily. 01/09/21  Yes Fay Records, MD  atorvastatin (LIPITOR) 80 MG tablet TAKE 1 TABLET BY MOUTH EVERY DAY IN THE EVENING AT 6PM Patient taking differently: Take 80 mg by mouth every evening. 10/31/20  Yes [name], MD  Blood Glucose Monitoring Suppl [facility] VERIO) w/Device KIT Use as directed twice daily E11.9 08/11/19  Yes [name], MD  carvedilol (COREG) 12.5 MG tablet Take 1 tablet (12.5 mg total) by mouth 2 (two) times daily with a meal. 11/14/20  Yes [name], MD  ELIQUIS 5 MG TABS tablet TAKE 1 TABLET BY MOUTH TWICE A DAY Patient taking differently: Take 5 mg by mouth 2 (two) times daily. 09/05/20  Yes Fay Records, MD  gabapentin (NEURONTIN) 100 MG capsule TAKE 1 CAPSULE BY MOUTH THREE TIMES A DAY Patient taking differently: Take 100 mg by mouth 3 (three) times daily. 11/10/20  Yes [name], MD  Insulin Glargine [facility]) 100 UNIT/ML Inject 14 Units into the skin at bedtime. Patient taking differently: Inject 20 Units into the skin at bedtime. 06/09/20  Yes Philemon Kingdom, MD  Insulin Pen Needle 32G X 4 MM MISC Use 1x a day 01/03/21  Yes Philemon Kingdom, MD  Lancets MISC Use as directed twice daily E11.9 08/11/19  Yes [name], MD  losartan (COZAAR) 100 MG tablet TAKE 1 TABLET BY MOUTH EVERY DAY Patient taking differently: Take 100 mg by mouth daily. 09/27/20  Yes [name], MD  metFORMIN (GLUCOPHAGE) 1000 MG tablet Take 1 tablet (1,000 mg total) by mouth 2 (two) times daily with a meal. 01/03/21  Yes Philemon Kingdom, MD  [facility] VERIO test strip USE AS DIRECTED TWICE DAILY E11.9 Patient taking differently: 1 each by Other route as directed. Twice daily. 08/19/20  Yes [name], MD  OVER THE COUNTER MEDICATION Place 1 drop into both eyes daily as needed (dry eyes). Over the  counter lubricating eye drop   Yes [provider]  sitaGLIPtin (JANUVIA) 100 MG tablet Take 1 tablet (100 mg total) by mouth daily. 01/03/21  Yes Philemon Kingdom, MD  aspirin EC 81 MG EC tablet Take 1 tablet (81 mg total) by mouth daily. With breakfast Patient not taking: Reported on 01/23/2021 06/28/18   Roxan Hockey, MD  KLOR-CON M20 20 MEQ tablet TAKE 1 TABLET (20 MEQ TOTAL) BY MOUTH 2 (TWO) TIMES A WEEK. 02/06/21   Fay Records, MD     Family History  Problem Relation Age of Onset  . Colon polyps Mother   . Stroke Mother        due to ICA stenosis  . CVA Neg Hx   . Seizures Neg Hx     Social History   Socioeconomic History  . Marital status: Widowed    Spouse name: Not on file  . Number of children: 2  . Years of education: Not on file  . Highest education level: Not on file  Occupational History  . Occupation: Acupuncturist    Comment: Retired 1999  Tobacco Use  . Smoking status: Former Smoker    Years: 10.00    Quit date: 1995    Years since quitting: 27.4  . Smokeless tobacco: Never Used  Vaping Use  . Vaping Use: Never used  Substance and Sexual Activity  . Alcohol use: Yes    Alcohol/week: 1.0 standard drink    Types: 1 Cans of beer per week    Comment: occasional or yearly  . Drug use: No  . Sexual activity: Not on file  Other Topics Concern  . Not on file  Social History Narrative  . Not on file   Social Determinants of Health   Financial Resource Strain: Not on file  Food Insecurity: Not on file  Transportation Needs: Not on file  Physical Activity: Not on file  Stress: Not on file  Social Connections: Not on file    Review of Systems: A 12 point ROS discussed and pertinent positives are indicated in the HPI above.  All other systems are negative.   Vital Signs: BP (!) 165/69 (BP Location: Right Arm)   Pulse 66   Temp 98.4 F (36.9 C) (Oral)   Resp 18   Ht 6' (1.829 m)   Wt 227 lb 11.8 oz (103.3 kg)   SpO2 97%   BMI  30.89 kg/m   Physical Exam Vitals reviewed.  HENT:     Mouth/Throat:     Mouth: Mucous membranes are moist.  Cardiovascular:     Rate and Rhythm: Normal rate and regular rhythm.     Heart sounds: Normal heart sounds.  Pulmonary:     Breath sounds: Normal breath sounds.  Abdominal:     Palpations: Abdomen is soft.  Musculoskeletal:     Comments: Follows few commands Moving all 4s  Skin:    General: Skin is warm.  Psychiatric:     Comments: Spoke to Daughter [name] vial phone She consents to procedure     Imaging: CT ABDOMEN WO CONTRAST  Result Date: 02/06/2021 CLINICAL DATA:  Evaluate anatomy for G-tube placement EXAM: CT ABDOMEN WITHOUT CONTRAST TECHNIQUE: Multidetector CT imaging of the abdomen was performed following the standard protocol without IV contrast. COMPARISON:  None. FINDINGS: Lower chest: No acute abnormality Hepatobiliary: No focal liver abnormality is seen. No gallstones, gallbladder wall thickening, or biliary dilatation. Pancreas: Pancreas is diffusely atrophic. Spleen: Normal in size without focal abnormality. Adrenals/Urinary Tract: Adrenal glands are normal. 1.4 cm nonobstructing calculus seen in the lower pole of the right kidney. The kidneys in visualized ureteral segment otherwise unremarkable. Stomach/Bowel: Nasogastric tube terminates within the gastric lumen. There is a calcified mass along the posterior fundal wall measuring 2.0 x 1.5 x 1.8 cm. Visualized small and large bowel normal in caliber and distribution. Vascular/Lymphatic: No enlarged retroperitoneal or or mesenteric lymph nodes. Scattered atheromatous plaque of the abdominal aorta and visualized branches. Other: No abdominal wall hernia or abnormality. Musculoskeletal: No acute or significant osseous findings. IMPRESSION: 1. Gastric anatomy is amenable to percutaneous gastrostomy tube placement. 2. 2.0 x 1.8 x 1.5 cm high density structure noted along the posterior gastric fundus may represent a  diverticulum containing oral contrast or alternatively could represent a calcified gastric neoplasm such as GIST. Follow-up contrast enhance abdominal CT should be performed in 2-3 months to again evaluate this structure. 3. 1.4 cm nonobstructing calculus seen in the lower pole the right kidney. Electronically Signed   By: Miachel Roux M.D.   On: 02/06/2021 16:18   CT HEAD WO CONTRAST  Result Date: 02/01/2021 CLINICAL DATA:  85 year old male with left thalamic hemorrhage presenting on 01/23/2021. EXAM: CT HEAD WITHOUT CONTRAST TECHNIQUE: Contiguous axial images were obtained from the base of the skull through the vertex without intravenous contrast. COMPARISON:  Head CTs 01/30/2021 and earlier. FINDINGS: Brain: Rounded hyperdense hemorrhage centered at the left thalamus is 28 x 29 x 26 mm (AP by transverse by CC) now for an estimated blood volume of 11 mL, mildly decreased compared to 01/23/2021 (16 mL estimated at that time). Surrounding edema and mild regional mass effect remain stable. Small volume of intraventricular extension of blood is stable along with ventricle size and configuration. No new intracranial hemorrhage. No significant midline shift. Basilar cisterns remain normal. Stable gray-white matter differentiation throughout the brain. Chronic bilateral white matter disease, chronic right cerebellar infarcts. Vascular: Calcified atherosclerosis at the skull base. Skull: No acute osseous abnormality identified. Sinuses/Orbits: Visualized paranasal sinuses and mastoids are stable and well aerated. Other: Right nasoenteric tube remains in place. Stable orbit and scalp soft tissues. IMPRESSION: 1. Slowly evolving left thalamic hemorrhage, mildly decreased in volume since 01/23/2021. Stable small volume IVH, regional edema and mass effect. 2. No new intracranial abnormality. Electronically Signed   By: Genevie Ann M.D.   On: 02/01/2021 08:06   CT HEAD WO CONTRAST  Result Date: 01/30/2021 CLINICAL DATA:   Intracranial hemorrhage EXAM: CT HEAD WITHOUT CONTRAST TECHNIQUE: Contiguous axial images were obtained from the base of the skull through the vertex without intravenous contrast. COMPARISON:  01/29/2021 FINDINGS: Brain: Left thalamic hemorrhage unchanged measuring 28 mm in diameter. Intraventricular hemorrhage unchanged. Negative for hydrocephalus Generalized atrophy with moderate white matter hypodensity  diffusely unchanged. No acute ischemic infarct. No midline shift. Vascular: Negative for hyperdense vessel Skull: Negative Sinuses/Orbits: Mild mucosal edema paranasal sinuses. NG tube in place. Other: None IMPRESSION: Stable head CT. Left thalamic hemorrhage and intraventricular hemorrhage unchanged. Negative for hydrocephalus. Electronically Signed   By: Franchot Gallo M.D.   On: 01/30/2021 08:30   CT HEAD WO CONTRAST  Result Date: 01/29/2021 CLINICAL DATA:  Cerebral hemorrhage suspected EXAM: CT HEAD WITHOUT CONTRAST TECHNIQUE: Contiguous axial images were obtained from the base of the skull through the vertex without intravenous contrast. COMPARISON:  Two days ago FINDINGS: Brain: Acute hematoma centered in the left thalamus and adjacent white matter, 3.1 cm in maximal span on axial images, unchanged when remeasured in a similar fashion. Intraventricular hemorrhage seen at the lateral ventricles with stable mild ventriculomegaly. Chronic small vessel ischemic type low-density in the hemispheric white matter. No evidence of acute cortical infarct. Small remote right cerebellar infarct. Regional mass effect from the hematoma is unchanged. Vascular: Atheromatous calcification Skull: Negative Sinuses/Orbits: Nasal intubation. IMPRESSION: 1. No progression of the left thalamic or intraventricular hemorrhage. 2. Unchanged ventriculomegaly. Electronically Signed   By: Monte Fantasia M.D.   On: 01/29/2021 10:21   CT Head Wo Contrast  Result Date: 01/27/2021 CLINICAL DATA:  Follow-up hemorrhagic stroke  involving the LEFT thalamus with extension into the ventricular system. EXAM: CT HEAD WITHOUT CONTRAST TECHNIQUE: Contiguous axial images were obtained from the base of the skull through the vertex without intravenous contrast. COMPARISON:  01/25/2021 and earlier. FINDINGS: Brain: Hematoma involving the LEFT thalamus measuring approximately 2.6 x 2.8 x 2.8 cm, unchanged in size since the initial 01/23/2021 CT. Interval decrease in the amount of intraventricular hemorrhage in the lateral ventricles and the third ventricle. Mass-effect with midline shift of approximately 7 mm to the RIGHT, unchanged. No new hemorrhage or hematoma. No extra-axial fluid collections. Vascular: Mild BILATERAL carotid siphon and vertebral artery atherosclerosis. Hyperdense vessel. Skull: No skull fracture or other focal osseous abnormality involving the skull. Sinuses/Orbits: Paranasal sinuses, mastoid air cells and middle ear cavities well-aerated. Benign senile scleral calcifications in both eyes. Other: None. IMPRESSION: 1. Stable LEFT thalamic hematoma and associated midline shift to the RIGHT of approximately 7 mm. 2. Decreasing intraventricular hemorrhage. 3. No new intracranial abnormality. Electronically Signed   By: Evangeline Dakin M.D.   On: 01/27/2021 10:55   CT HEAD WO CONTRAST  Result Date: 01/25/2021 CLINICAL DATA:  Follow-up examination for acute stroke. EXAM: CT HEAD WITHOUT CONTRAST TECHNIQUE: Contiguous axial images were obtained from the base of the skull through the vertex without intravenous contrast. COMPARISON:  Prior CT from 01/23/2021. FINDINGS: Brain: Intraparenchymal hemorrhage centered at the left thalamus is not significantly changed in size or morphology as compared to previous exam. Surrounding edema is stable to mildly increased. Similar regional mass effect with localized left-to-right shift. Intraventricular extension with blood within the lateral and third ventricles, relatively stable. Stable  ventricular size and morphology without hydrocephalus or trapping. Basilar cisterns remain patent. No new intracranial hemorrhage or large vessel territory infarct. No extra-axial collection. Underlying atrophy with chronic microvascular ischemic disease again noted. Small remote right cerebellar infarcts noted as well. Vascular: No hyperdense vessel. Scattered vascular calcifications noted within the carotid siphons. Skull: Scalp soft tissues and calvarium demonstrate no acute finding. Sinuses/Orbits: Globes and orbital soft tissues demonstrate no acute finding. Mild scattered mucosal thickening noted within the ethmoidal air cells and maxillary sinuses. Paranasal sinuses are otherwise clear. Mastoid air cells remain largely clear. Other: None.  IMPRESSION: 1. No significant interval change in size and morphology of left thalamic hemorrhage. Surrounding edema has mildly increased from prior, although regional mass effect and localized left-to-right shift is not significantly changed. 2. Intraventricular extension with blood within the lateral and third ventricles, stable. Stable ventricular size and morphology without hydrocephalus or trapping. 3. No other new acute intracranial abnormality. Electronically Signed   By: Jeannine Boga M.D.   On: 01/25/2021 04:19   CT HEAD WO CONTRAST  Result Date: 01/23/2021 CLINICAL DATA:  Follow-up intracranial hemorrhage EXAM: CT HEAD WITHOUT CONTRAST TECHNIQUE: Contiguous axial images were obtained from the base of the skull through the vertex without intravenous contrast. COMPARISON:  CT from earlier in the same day. FINDINGS: Brain: Parenchymal hemorrhage is again noted in the region of the left thalamus. Some surrounding edema is noted. The hematoma measures approximately 3.0 x 2.4 cm and roughly stable from the prior exam. Some intraventricular component is again noted particularly in the left lateral ventricle. Chronic white matter ischemic changes are noted. No new  focal hemorrhage is seen. Chronic right cerebellar infarct is noted. Vascular: No hyperdense vessel or unexpected calcification. Skull: Normal. Negative for fracture or focal lesion. Sinuses/Orbits: No acute finding. Other: None. IMPRESSION: Stable appearing left thalamic hemorrhage with intraventricular component. No new focal abnormality is noted. Electronically Signed   By: Inez Catalina M.D.   On: 01/23/2021 21:54   MR MRA HEAD WO CONTRAST  Result Date: 01/23/2021 CLINICAL DATA:  Initial evaluation for acute intracranial hemorrhage, headache. EXAM: MRI HEAD WITHOUT CONTRAST MRA HEAD WITHOUT CONTRAST TECHNIQUE: Multiplanar, multi-echo pulse sequences of the brain and surrounding structures were acquired without intravenous contrast. Angiographic images of the Circle of Willis were acquired using MRA technique without intravenous contrast. COMPARISON: No pertinent prior exam. COMPARISON:  Prior CT from earlier the same day as well as previous MRI from 06/23/2018. FINDINGS: MRI HEAD FINDINGS Brain: Examination moderately to severely degraded by motion artifact. Generalized age-related cerebral atrophy. Patchy and confluent T2/FLAIR hyperintensity within the periventricular and deep white matter both cerebral hemispheres most consistent with chronic small vessel ischemic disease. Few small remote right cerebellar infarcts noted. Previously identified acute intraparenchymal hemorrhage centered at the left thalamus again seen. Bleed measures 3.0 x 3.1 x 3.4 cm on this motion degraded exam (estimated volume 16 cc). No visible underlying mass lesion or other structure abnormality on this motion degraded noncontrast examination. Surrounding vasogenic edema with mild regional mass effect. Associated mild localized left-to-right shift measures up to 5 mm at the septum pellucidum. Associated intraventricular extension with blood within the left greater than right lateral ventricles. No hydrocephalus or ventricular  trapping at this time. No other evidence for acute intracranial hemorrhage. No other foci of susceptibility artifact to suggest chronic hemorrhage elsewhere within the brain. No other foci of restricted diffusion to suggest acute or subacute ischemia. Gray-white matter differentiation otherwise maintained. No mass lesion or mass effect elsewhere within the brain. No extra-axial fluid collection. Pituitary gland suprasellar region within normal limits. Vascular: Major intracranial vascular flow voids are maintained. Skull and upper cervical spine: Craniocervical junction within normal limits. Bone marrow signal intensity normal. No scalp soft tissue abnormality. Sinuses/Orbits: Patient status post ocular lens replacement on the right. Globes and orbital soft tissues demonstrate no acute finding. Paranasal sinuses are largely clear. Trace bilateral mastoid effusions, of doubtful significance. Inner ear structures grossly normal. Other: None. MRA HEAD FINDINGS ANTERIOR CIRCULATION: Examination degraded by motion artifact. Both internal carotid arteries patent to the termini without  stenosis or other appreciable abnormality. A1 segments patent. Anterior communicating artery complex grossly within normal limits. Anterior cerebral arteries grossly patent to their distal aspects without appreciable stenosis. No obvious M1 stenosis. Grossly normal MCA bifurcations. Distal MCA branches well perfused and symmetric. POSTERIOR CIRCULATION: Both V4 segments patent to the vertebrobasilar junction without stenosis. Left vertebral artery dominant. Left PICA origin patent and normal. Right PICA origin not definitely seen. Basilar patent to its distal aspect without stenosis. Superior cerebellar arteries patent bilaterally. Both PCAs primarily supplied via the basilar. PCAs perfused to their distal aspects without appreciable stenosis. No intracranial aneurysm. No vascular abnormality seen underlying the acute left thalamic  hemorrhage. IMPRESSION: MRI HEAD IMPRESSION: 1. Motion degraded exam. 2. 3.0 x 3.1 x 3.4 cm acute intraparenchymal hemorrhage centered at the left thalamus. Surrounding vasogenic edema with mild regional mass effect and 5 mm of left-to-right shift. No visible underlying mass lesion or other structure abnormality on this motion degraded exam. 3. Associated intraventricular extension with blood within the left greater than right lateral ventricles. No hydrocephalus or ventricular trapping at this time. 4. Underlying age-related cerebral atrophy with mild chronic small vessel ischemic disease, with a few small remote right cerebellar infarcts. MRA HEAD IMPRESSION: 1. Technically limited exam due to extensive motion artifact. 2. Grossly negative intracranial MRA. No large vessel occlusion, hemodynamically significant stenosis, or other acute vascular abnormality. No vascular abnormality seen underlying the acute left thalamic hemorrhage. Please note that while a MRA neck was also ordered for this exam, this was unable to be performed due to the patient's inability to tolerate the full length of the study and extensive motion artifact. Electronically Signed   By: Jeannine Boga M.D.   On: 01/23/2021 21:41   MR ANGIO NECK W WO CONTRAST  Result Date: 01/23/2021 CLINICAL DATA:  Initial evaluation for acute intracranial hemorrhage, headache. EXAM: MRI HEAD WITHOUT CONTRAST MRA HEAD WITHOUT CONTRAST TECHNIQUE: Multiplanar, multi-echo pulse sequences of the brain and surrounding structures were acquired without intravenous contrast. Angiographic images of the Circle of Willis were acquired using MRA technique without intravenous contrast. COMPARISON: No pertinent prior exam. COMPARISON:  Prior CT from earlier the same day as well as previous MRI from 06/23/2018. FINDINGS: MRI HEAD FINDINGS Brain: Examination moderately to severely degraded by motion artifact. Generalized age-related cerebral atrophy. Patchy and  confluent T2/FLAIR hyperintensity within the periventricular and deep white matter both cerebral hemispheres most consistent with chronic small vessel ischemic disease. Few small remote right cerebellar infarcts noted. Previously identified acute intraparenchymal hemorrhage centered at the left thalamus again seen. Bleed measures 3.0 x 3.1 x 3.4 cm on this motion degraded exam (estimated volume 16 cc). No visible underlying mass lesion or other structure abnormality on this motion degraded noncontrast examination. Surrounding vasogenic edema with mild regional mass effect. Associated mild localized left-to-right shift measures up to 5 mm at the septum pellucidum. Associated intraventricular extension with blood within the left greater than right lateral ventricles. No hydrocephalus or ventricular trapping at this time. No other evidence for acute intracranial hemorrhage. No other foci of susceptibility artifact to suggest chronic hemorrhage elsewhere within the brain. No other foci of restricted diffusion to suggest acute or subacute ischemia. Gray-white matter differentiation otherwise maintained. No mass lesion or mass effect elsewhere within the brain. No extra-axial fluid collection. Pituitary gland suprasellar region within normal limits. Vascular: Major intracranial vascular flow voids are maintained. Skull and upper cervical spine: Craniocervical junction within normal limits. Bone marrow signal intensity normal. No scalp soft tissue  abnormality. Sinuses/Orbits: Patient status post ocular lens replacement on the right. Globes and orbital soft tissues demonstrate no acute finding. Paranasal sinuses are largely clear. Trace bilateral mastoid effusions, of doubtful significance. Inner ear structures grossly normal. Other: None. MRA HEAD FINDINGS ANTERIOR CIRCULATION: Examination degraded by motion artifact. Both internal carotid arteries patent to the termini without stenosis or other appreciable abnormality. A1  segments patent. Anterior communicating artery complex grossly within normal limits. Anterior cerebral arteries grossly patent to their distal aspects without appreciable stenosis. No obvious M1 stenosis. Grossly normal MCA bifurcations. Distal MCA branches well perfused and symmetric. POSTERIOR CIRCULATION: Both V4 segments patent to the vertebrobasilar junction without stenosis. Left vertebral artery dominant. Left PICA origin patent and normal. Right PICA origin not definitely seen. Basilar patent to its distal aspect without stenosis. Superior cerebellar arteries patent bilaterally. Both PCAs primarily supplied via the basilar. PCAs perfused to their distal aspects without appreciable stenosis. No intracranial aneurysm. No vascular abnormality seen underlying the acute left thalamic hemorrhage. IMPRESSION: MRI HEAD IMPRESSION: 1. Motion degraded exam. 2. 3.0 x 3.1 x 3.4 cm acute intraparenchymal hemorrhage centered at the left thalamus. Surrounding vasogenic edema with mild regional mass effect and 5 mm of left-to-right shift. No visible underlying mass lesion or other structure abnormality on this motion degraded exam. 3. Associated intraventricular extension with blood within the left greater than right lateral ventricles. No hydrocephalus or ventricular trapping at this time. 4. Underlying age-related cerebral atrophy with mild chronic small vessel ischemic disease, with a few small remote right cerebellar infarcts. MRA HEAD IMPRESSION: 1. Technically limited exam due to extensive motion artifact. 2. Grossly negative intracranial MRA. No large vessel occlusion, hemodynamically significant stenosis, or other acute vascular abnormality. No vascular abnormality seen underlying the acute left thalamic hemorrhage. Please note that while a MRA neck was also ordered for this exam, this was unable to be performed due to the patient's inability to tolerate the full length of the study and extensive motion artifact.  Electronically Signed   By: Jeannine Boga M.D.   On: 01/23/2021 21:41   MR BRAIN WO CONTRAST  Result Date: 01/23/2021 CLINICAL DATA:  Initial evaluation for acute intracranial hemorrhage, headache. EXAM: MRI HEAD WITHOUT CONTRAST MRA HEAD WITHOUT CONTRAST TECHNIQUE: Multiplanar, multi-echo pulse sequences of the brain and surrounding structures were acquired without intravenous contrast. Angiographic images of the Circle of Willis were acquired using MRA technique without intravenous contrast. COMPARISON: No pertinent prior exam. COMPARISON:  Prior CT from earlier the same day as well as previous MRI from 06/23/2018. FINDINGS: MRI HEAD FINDINGS Brain: Examination moderately to severely degraded by motion artifact. Generalized age-related cerebral atrophy. Patchy and confluent T2/FLAIR hyperintensity within the periventricular and deep white matter both cerebral hemispheres most consistent with chronic small vessel ischemic disease. Few small remote right cerebellar infarcts noted. Previously identified acute intraparenchymal hemorrhage centered at the left thalamus again seen. Bleed measures 3.0 x 3.1 x 3.4 cm on this motion degraded exam (estimated volume 16 cc). No visible underlying mass lesion or other structure abnormality on this motion degraded noncontrast examination. Surrounding vasogenic edema with mild regional mass effect. Associated mild localized left-to-right shift measures up to 5 mm at the septum pellucidum. Associated intraventricular extension with blood within the left greater than right lateral ventricles. No hydrocephalus or ventricular trapping at this time. No other evidence for acute intracranial hemorrhage. No other foci of susceptibility artifact to suggest chronic hemorrhage elsewhere within the brain. No other foci of restricted diffusion to suggest acute  or subacute ischemia. Gray-white matter differentiation otherwise maintained. No mass lesion or mass effect elsewhere within  the brain. No extra-axial fluid collection. Pituitary gland suprasellar region within normal limits. Vascular: Major intracranial vascular flow voids are maintained. Skull and upper cervical spine: Craniocervical junction within normal limits. Bone marrow signal intensity normal. No scalp soft tissue abnormality. Sinuses/Orbits: Patient status post ocular lens replacement on the right. Globes and orbital soft tissues demonstrate no acute finding. Paranasal sinuses are largely clear. Trace bilateral mastoid effusions, of doubtful significance. Inner ear structures grossly normal. Other: None. MRA HEAD FINDINGS ANTERIOR CIRCULATION: Examination degraded by motion artifact. Both internal carotid arteries patent to the termini without stenosis or other appreciable abnormality. A1 segments patent. Anterior communicating artery complex grossly within normal limits. Anterior cerebral arteries grossly patent to their distal aspects without appreciable stenosis. No obvious M1 stenosis. Grossly normal MCA bifurcations. Distal MCA branches well perfused and symmetric. POSTERIOR CIRCULATION: Both V4 segments patent to the vertebrobasilar junction without stenosis. Left vertebral artery dominant. Left PICA origin patent and normal. Right PICA origin not definitely seen. Basilar patent to its distal aspect without stenosis. Superior cerebellar arteries patent bilaterally. Both PCAs primarily supplied via the basilar. PCAs perfused to their distal aspects without appreciable stenosis. No intracranial aneurysm. No vascular abnormality seen underlying the acute left thalamic hemorrhage. IMPRESSION: MRI HEAD IMPRESSION: 1. Motion degraded exam. 2. 3.0 x 3.1 x 3.4 cm acute intraparenchymal hemorrhage centered at the left thalamus. Surrounding vasogenic edema with mild regional mass effect and 5 mm of left-to-right shift. No visible underlying mass lesion or other structure abnormality on this motion degraded exam. 3. Associated  intraventricular extension with blood within the left greater than right lateral ventricles. No hydrocephalus or ventricular trapping at this time. 4. Underlying age-related cerebral atrophy with mild chronic small vessel ischemic disease, with a few small remote right cerebellar infarcts. MRA HEAD IMPRESSION: 1. Technically limited exam due to extensive motion artifact. 2. Grossly negative intracranial MRA. No large vessel occlusion, hemodynamically significant stenosis, or other acute vascular abnormality. No vascular abnormality seen underlying the acute left thalamic hemorrhage. Please note that while a MRA neck was also ordered for this exam, this was unable to be performed due to the patient's inability to tolerate the full length of the study and extensive motion artifact. Electronically Signed   By: Jeannine Boga M.D.   On: 01/23/2021 21:41   DG CHEST PORT 1 VIEW  Result Date: 02/03/2021 CLINICAL DATA:  Shortness of breath EXAM: PORTABLE CHEST 1 VIEW COMPARISON:  Jan 30, 2021 FINDINGS: Enteric tube tip is below the diaphragm, not seen on current examination. There is slight left base atelectasis. Lungs elsewhere are clear. Heart is upper normal in size with pulmonary vascularity normal. No adenopathy. There is aortic atherosclerosis. No bone lesions. IMPRESSION: Enteric tube tip below diaphragm. Atelectasis left base. Earliest changes of pneumonia in this area questioned. Lungs elsewhere clear. Stable cardiac silhouette. Aortic Atherosclerosis (ICD10-I70.0). Electronically Signed   By: Lowella Grip III M.D.   On: 02/03/2021 11:35   DG CHEST PORT 1 VIEW  Result Date: 01/30/2021 CLINICAL DATA:  Leukocytosis. EXAM: PORTABLE CHEST 1 VIEW COMPARISON:  Jan 27, 2021. FINDINGS: The heart size and mediastinal contours are within normal limits. Distal tip of feeding tube is seen in proximal stomach. Both lungs are clear. The visualized skeletal structures are unremarkable. IMPRESSION: No active  disease. Electronically Signed   By: Marijo Conception M.D.   On: 01/30/2021 11:38   DG  Chest Port 1 View  Result Date: 01/27/2021 CLINICAL DATA:  Code stroke EXAM: PORTABLE CHEST 1 VIEW COMPARISON:  01/26/2021 FINDINGS: Esophageal tube tip coiled within the proximal stomach. No focal opacity or pleural effusion. Stable cardiomediastinal silhouette with borderline cardiomegaly. No pneumothorax. IMPRESSION: No active disease. Electronically Signed   By: Donavan Foil M.D.   On: 01/27/2021 18:38   DG Chest Port 1 View  Result Date: 01/26/2021 CLINICAL DATA:  Fever, code stroke EXAM: PORTABLE CHEST 1 VIEW COMPARISON:  Radiograph 08/13/2011 FINDINGS: Unchanged cardiomediastinal silhouette. There is no focal airspace disease. There is no pleural effusion or visible pneumothorax. There is no acute osseous abnormality. There is a at weighted tip enteric tube overlying the stomach. IMPRESSION: No evidence of acute cardiopulmonary disease. Electronically Signed   By: Maurine Simmering   On: 01/26/2021 10:06   DG Abd Portable 1V  Result Date: 02/06/2021 CLINICAL DATA:  Intracranial hemorrhage and assessment for possible gastrostomy tube placement. EXAM: PORTABLE ABDOMEN - 1 VIEW COMPARISON:  Prior abdominal film at [facility] dated 11/08/2011 FINDINGS: High position decompressed stomach contains a feeding tube. Difficult to exclude component of hiatal hernia. Mild gaseous prominence of a redundant colon with transverse colon and splenic flexure located very near the decompressed stomach. No evidence of small-bowel obstruction. Calcification in the right abdomen was present previously and likely represents a lower pole renal calculus. Stable advanced degenerative disease of the lower lumbosacral spine. IMPRESSION: High position decompressed stomach containing feeding tube. Difficult to exclude component of hiatal hernia. Redundant colon demonstrates mild gaseous prominence with the transverse colon and splenic  flexure located near the decompressed stomach. Prior to considering percutaneous versus surgical gastrostomy, a CT of the abdomen would be helpful to better evaluate anatomy. Electronically Signed   By: Aletta Edouard M.D.   On: 02/06/2021 08:51   DG Swallowing Func-Speech Pathology  Result Date: 02/02/2021 Objective Swallowing Evaluation: Type of Study: MBS-Modified Barium Swallow Study  Patient Details Name: HAYDON DORRIS MRN: [id number] Date of Birth: 03/12/1936 Today's Date: 02/02/2021 Time: SLP Start Time (ACUTE ONLY): 0930 -SLP Stop Time (ACUTE ONLY): 0945 SLP Time Calculation (min) (ACUTE ONLY): 15 min Past Medical History: Past Medical History: Diagnosis Date . ALLERGIC RHINITIS 10/13/2007 . ANEMIA, CHRONIC DISEASE NEC 03/31/2007 . BENIGN PROSTATIC HYPERTROPHY 03/31/2007 . Chest pain   Stress echo, normal, December, 2012 . Chronic anemia  . DIABETES MELLITUS, TYPE II 03/31/2007 . [place] DISEASE, LUMBAR 03/31/2007 . ED (erectile dysfunction)  . Ejection fraction   EF  normal, stress echo, December,  2012 . History of prostatitis  . HYPERLIPIDEMIA 03/31/2007 . HYPERTENSION 03/31/2007 . Nephrolithiasis 12/07/2010 . Obesity  . POLYP, ANAL AND RECTAL 03/31/2007 . Right eye trauma   hx as child . Stroke [facility])  Past Surgical History: Past Surgical History: Procedure Laterality Date . [place] SURGERY    1999 . PROSTATE BIOPSY    s/p . RECTAL POLYPECTOMY    Transanal excision 10/2005 . SKIN BIOPSY    s/p right upper back 2009- benign Dr. Tonia Brooms HPI: Pt is a 85 y.o. M who presents with acute onset R sided weakness, facial droop, numbness, aphasia with left gaze preference. CTH showing L thalamic ICH. Significant PMH: afib, DM2, HLD, HTN, stroke  No data recorded Assessment / Plan / Recommendation CHL IP CLINICAL IMPRESSIONS 02/02/2021 Clinical Impression Pt presents with oropharyngeal dysphagia, with cognitive and sensorimotor component. Swallow function is marked by anterior spillage of liquids, weak lingual manipulation of  bolus, reduced tongue base retraction and  epiglottic inversion,  resulting in buildup of pharyngeal residuals and silent aspiration of liquids. Thin liquids silently aspirated (PAS 8) with pt demontrating delayed cough as testing progressed.  NTL penetrated to cords, with no attempt to eject (PAS 5). While no aspiration or pentration noted with HTL, buildup of pharyngeal residuals entered laryngeal vestibule and fell below vocal cords without response from pt. Pureed and mechanical soft solids increased pharyngeal residuals, but no aspiration noted, however cannot rule out possibility of aspiration of residuals as noted previously. Recommend pt continue NPO with alternative means of nutrition via Cortrak. Pt may have ice chips after thorough oral care. SLP to f/u for therapeutic trials and determine readiness for PO diet. Recommendations discussed with pt and RN. SLP Visit Diagnosis Dysphagia, oropharyngeal phase (R13.12) Attention and concentration deficit following -- Frontal lobe and executive function deficit following -- Impact on safety and function Moderate aspiration risk   CHL IP TREATMENT RECOMMENDATION 02/02/2021 Treatment Recommendations Therapy as outlined in treatment plan below   Prognosis 02/02/2021 Prognosis for Safe Diet Advancement Fair Barriers to Reach Goals Cognitive deficits;Language deficits;Severity of deficits Barriers/Prognosis Comment -- CHL IP DIET RECOMMENDATION 02/02/2021 SLP Diet Recommendations NPO;Ice chips PRN after oral care Liquid Administration via -- Medication Administration Via alternative means Compensations -- Postural Changes Seated upright at 90 degrees   CHL IP OTHER RECOMMENDATIONS 02/02/2021 Recommended Consults -- Oral Care Recommendations Oral care QID Other Recommendations --   CHL IP FOLLOW UP RECOMMENDATIONS 02/02/2021 Follow up Recommendations Skilled Nursing facility   [facility] IP FREQUENCY AND DURATION 02/02/2021 Speech Therapy Frequency (ACUTE ONLY) min 2x/week Treatment  Duration 2 weeks      CHL IP ORAL PHASE 02/02/2021 Oral Phase Impaired Oral - Pudding Teaspoon -- Oral - Pudding Cup -- Oral - Honey Teaspoon NT Oral - Honey Cup Right anterior bolus loss;Weak lingual manipulation;Decreased bolus cohesion Oral - Nectar Teaspoon NT Oral - Nectar Cup Right anterior bolus loss;Weak lingual manipulation;Decreased bolus cohesion Oral - Nectar Straw NT Oral - Thin Teaspoon NT Oral - Thin Cup Right anterior bolus loss;Weak lingual manipulation;Decreased bolus cohesion Oral - Thin Straw NT Oral - Puree Weak lingual manipulation;Decreased bolus cohesion Oral - Mech Soft Weak lingual manipulation;Decreased bolus cohesion Oral - Regular NT Oral - Multi-Consistency NT Oral - Pill Weak lingual manipulation;Decreased bolus cohesion Oral Phase - Comment --  CHL IP PHARYNGEAL PHASE 02/02/2021 Pharyngeal Phase Impaired Pharyngeal- Pudding Teaspoon -- Pharyngeal -- Pharyngeal- Pudding Cup -- Pharyngeal -- Pharyngeal- Honey Teaspoon NT Pharyngeal -- Pharyngeal- Honey Cup Reduced pharyngeal peristalsis;Reduced epiglottic inversion;Reduced airway/laryngeal closure;Pharyngeal residue - valleculae;Pharyngeal residue - posterior pharnyx;Reduced tongue base retraction Pharyngeal -- Pharyngeal- Nectar Teaspoon NT Pharyngeal -- Pharyngeal- Nectar Cup Reduced pharyngeal peristalsis;Reduced epiglottic inversion;Reduced airway/laryngeal closure;Pharyngeal residue - valleculae;Pharyngeal residue - posterior pharnyx;Reduced tongue base retraction;Penetration/Aspiration during swallow;Penetration/Apiration after swallow Pharyngeal Material enters airway, CONTACTS cords and then ejected out;Material enters airway, passes BELOW cords without attempt by patient to eject out (silent aspiration);Material enters airway, passes BELOW cords and not ejected out despite cough attempt by patient Pharyngeal- Nectar Straw NT Pharyngeal -- Pharyngeal- Thin Teaspoon NT Pharyngeal -- Pharyngeal- Thin Cup Reduced pharyngeal  peristalsis;Reduced epiglottic inversion;Reduced airway/laryngeal closure;Pharyngeal residue - valleculae;Pharyngeal residue - posterior pharnyx;Reduced tongue base retraction;Penetration/Aspiration during swallow Pharyngeal Material enters airway, passes BELOW cords without attempt by patient to eject out (silent aspiration) Pharyngeal- Thin Straw NT Pharyngeal -- Pharyngeal- Puree Reduced epiglottic inversion;Reduced pharyngeal peristalsis;Pharyngeal residue - valleculae;Pharyngeal residue - pyriform Pharyngeal -- Pharyngeal- Mechanical Soft Reduced epiglottic inversion;Reduced pharyngeal peristalsis;Pharyngeal residue - valleculae;Pharyngeal  residue - pyriform;Reduced tongue base retraction Pharyngeal -- Pharyngeal- Regular NT Pharyngeal -- Pharyngeal- Multi-consistency NT Pharyngeal -- Pharyngeal- Pill Reduced epiglottic inversion;Reduced pharyngeal peristalsis;Pharyngeal residue - valleculae;Pharyngeal residue - pyriform;Compensatory strategies attempted (with notebox) Pharyngeal -- Pharyngeal Comment --  CHL IP CERVICAL ESOPHAGEAL PHASE 02/02/2021 Cervical Esophageal Phase WFL Pudding Teaspoon -- Pudding Cup -- Honey Teaspoon -- Honey Cup -- Nectar Teaspoon -- Nectar Cup -- Nectar Straw -- Thin Teaspoon -- Thin Cup -- Thin Straw -- Puree -- Mechanical Soft -- Regular -- Multi-consistency -- Pill -- Cervical Esophageal Comment -- Ellwood Dense, MA, CCC-SLP Acute Rehabilitation Services Office Number: [phone] [name] 02/02/2021, 11:54 AM              EEG adult  Result Date: 01/27/2021 Lora Havens, MD     01/27/2021 11:40 AM Patient Name: JONAHTAN MANSEAU MRN: [id number] Epilepsy Attending: Lora Havens Referring Physician/Provider: Dr. Antony Contras Date: 01/27/2021 Duration: 23.10 minutes Patient history: 85 year old male with left thalamic hemorrhage.  EEG to evaluate for seizures. Level of alertness: Awake AEDs during EEG study: Gabapentin Technical aspects: This EEG study was done with  scalp electrodes positioned according to the 10-20 International system of electrode placement. Electrical activity was acquired at a sampling rate of 500Hz  and reviewed with a high frequency filter of 70Hz  and a low frequency filter of 1Hz . EEG data were recorded continuously and digitally stored. Description: The posterior dominant rhythm consists of 8 Hz activity of moderate voltage (25-35 uV) seen predominantly in posterior head regions, symmetric and reactive to eye opening and eye closing.  EEG showed continuous rhythmic sharply contoured 3 to 5 Hz theta- delta slowing in left frontotemporal region hyperventilation and photic stimulation were not performed.   ABNORMALITY -Continuous slow, left frontotemporal region IMPRESSION: This study is suggestive of cortical dysfunction in left frontotemporal region likely secondary to underlying bleed. No seizures or definite epileptiform discharges were seen throughout the recording. Lora Havens   ECHOCARDIOGRAM COMPLETE  Result Date: 01/24/2021    ECHOCARDIOGRAM REPORT   Patient Name:   NERI SAMEK Date of Exam: 01/24/2021 Medical Rec #:  [id number]     Height:       72.0 in Accession #:    [id number]    Weight:       215.6 lb Date of Birth:  06-12-1936     BSA:          2.200 m Patient Age:    61 years      BP:           143/92 mmHg Patient Gender: M             HR:           77 bpm. Exam Location:  Inpatient Procedure: 2D Echo, Cardiac Doppler and Color Doppler Indications:    Stroke I63.9  History:        Patient has prior history of Echocardiogram examinations, most                 recent 04/02/2018. Stroke, Signs/Symptoms:Chest Pain; Risk                 Factors:Hypertension, Diabetes and Dyslipidemia.  Sonographer:    Tiffany Dance Referring Phys: [id number] [place]  Sonographer Comments: Suboptimal subcostal window. IMPRESSIONS  1. Left ventricular ejection fraction, by estimation, is 55 to 60%. The left ventricle has normal function. The left  ventricle has no regional wall motion abnormalities. Left ventricular diastolic  parameters are consistent with Grade I diastolic dysfunction (impaired relaxation).  2. Right ventricular systolic function is normal. The right ventricular size is normal.  3. Left atrial size was mildly dilated.  4. The mitral valve is normal in structure. No evidence of mitral valve regurgitation. No evidence of mitral stenosis.  5. The aortic valve is normal in structure. Aortic valve regurgitation is not visualized. Mild aortic valve sclerosis is present, with no evidence of aortic valve stenosis.  6. The inferior vena cava is normal in size with greater than 50% respiratory variability, suggesting right atrial pressure of 3 mmHg. Comparison(s): Prior images unable to be directly viewed, comparison made by report only. Conclusion(s)/Recommendation(s): No intracardiac source of embolism detected on this transthoracic study. Consider saline contrast study or TEE to evaluate the atrial septal aneurysm for evidence of PFO/shunting. FINDINGS  Left Ventricle: Left ventricular ejection fraction, by estimation, is 55 to 60%. The left ventricle has normal function. The left ventricle has no regional wall motion abnormalities. The left ventricular internal cavity size was normal in size. There is  borderline concentric left ventricular hypertrophy. Left ventricular diastolic parameters are consistent with Grade I diastolic dysfunction (impaired relaxation). Indeterminate filling pressures. Right Ventricle: The right ventricular size is normal. No increase in right ventricular wall thickness. Right ventricular systolic function is normal. Left Atrium: Left atrial size was mildly dilated. Right Atrium: Right atrial size was normal in size. Pericardium: There is no evidence of pericardial effusion. Mitral Valve: The mitral valve is normal in structure. Mild mitral annular calcification. No evidence of mitral valve regurgitation. No evidence of  mitral valve stenosis. Tricuspid Valve: The tricuspid valve is normal in structure. Tricuspid valve regurgitation is not demonstrated. No evidence of tricuspid stenosis. Aortic Valve: The aortic valve is normal in structure. Aortic valve regurgitation is not visualized. Mild aortic valve sclerosis is present, with no evidence of aortic valve stenosis. Pulmonic Valve: The pulmonic valve was normal in structure. Pulmonic valve regurgitation is not visualized. No evidence of pulmonic stenosis. Aorta: The aortic root is normal in size and structure. Venous: The inferior vena cava is normal in size with greater than 50% respiratory variability, suggesting right atrial pressure of 3 mmHg. IAS/Shunts: The interatrial septum is aneurysmal. No atrial level shunt detected by color flow Doppler.  LEFT VENTRICLE PLAX 2D LVIDd:         4.00 cm     Diastology LVIDs:         3.00 cm     LV e' medial:    3.70 cm/s LV PW:         1.30 cm     LV E/e' medial:  14.9 LV IVS:        1.20 cm     LV e' lateral:   6.64 cm/s LVOT diam:     2.40 cm     LV E/e' lateral: 8.3 LV SV:         80 LV SV Index:   36 LVOT Area:     4.52 cm  LV Volumes (MOD) LV vol d, MOD A2C: 73.3 ml LV vol d, MOD A4C: 82.0 ml LV vol s, MOD A2C: 28.7 ml LV vol s, MOD A4C: 35.1 ml LV SV MOD A2C:     44.6 ml LV SV MOD A4C:     82.0 ml LV SV MOD BP:      46.3 ml RIGHT VENTRICLE             IVC RV  Basal diam:  1.80 cm     IVC diam: 1.60 cm RV S prime:     13.50 cm/s TAPSE (M-mode): 2.3 cm LEFT ATRIUM             Index       RIGHT ATRIUM           Index LA diam:        4.00 cm 1.82 cm/m  RA Area:     12.10 cm LA Vol (A2C):   74.9 ml 34.05 ml/m RA Volume:   22.20 ml  10.09 ml/m LA Vol (A4C):   32.2 ml 14.64 ml/m LA Biplane Vol: 50.7 ml 23.05 ml/m  AORTIC VALVE LVOT Vmax:   94.20 cm/s LVOT Vmean:  64.400 cm/s LVOT VTI:    0.176 m  AORTA Ao Root diam: 3.80 cm Ao Asc diam:  3.80 cm MITRAL VALVE MV Area (PHT): 3.12 cm    SHUNTS MV Decel Time: 243 msec    Systemic VTI:   0.18 m MV E velocity: 55.30 cm/s  Systemic Diam: 2.40 cm MV A velocity: 97.30 cm/s MV E/A ratio:  0.57 Mihai Croitoru MD Electronically signed by Sanda Klein MD Signature Date/Time: 01/24/2021/4:17:34 PM    Final    CT HEAD CODE STROKE WO CONTRAST  Result Date: 01/23/2021 CLINICAL DATA:  Code stroke.  Left-sided facial droop EXAM: CT HEAD WITHOUT CONTRAST TECHNIQUE: Contiguous axial images were obtained from the base of the skull through the vertex without intravenous contrast. COMPARISON:  2019 FINDINGS: Brain: There is acute hemorrhage centered within the left thalamus measuring 2.6 x 2.8 x 3 cm. Surrounding edema is present. There is partial effacement of the third ventricle. Intraventricular extension is present with hemorrhage seen within the left lateral and third ventricles. Gray-white differentiation is preserved. Patchy and confluent areas of hypoattenuation in the supratentorial white matter are nonspecific but probably reflect moderate chronic microvascular ischemic changes. Small chronic right cerebellar infarct. Prominence of the ventricles and sulci reflects generalized parenchymal volume loss. Vascular: There is intracranial atherosclerotic calcification at the skull base. Skull: Unremarkable. Sinuses/Orbits: Paranasal sinus mucosal thickening. No acute orbital abnormality. Other: Mastoid air cells are clear. IMPRESSION: Acute left thalamic hemorrhage with mild regional mass effect and intraventricular extension. No hydrocephalus. Chronic microvascular ischemic changes. Small chronic right cerebellar infarct. Initial results were communicated to Dr. Lorrin Goodell at 3:42 pm on 01/23/2021 by text page via the [facility] messaging system. Electronically Signed   By: Macy Mis M.D.   On: 01/23/2021 15:45   VAS US CAROTID  Result Date: 02/01/2021 Carotid Arterial Duplex Study Patient Name:  NUMA SCHROETER  Date of Exam:   01/30/2021 Medical Rec #: [id number]      Accession #:    [id number] Date of Birth:  19-Feb-1936      Patient Gender: M Patient Age:   85Y Exam Location:  [facility] Procedure:      VAS US CAROTID Referring Phys: [id number] Rosalin Hawking --------------------------------------------------------------------------------  Indications:       CVA. Risk Factors:      Hypertension, hyperlipidemia, Diabetes, prior CVA. Comparison Study:  no prior Performing Technologist: Abram Sander RVS  Examination Guidelines: A complete evaluation includes B-mode imaging, spectral Doppler, color Doppler, and power Doppler as needed of all accessible portions of each vessel. Bilateral testing is considered an integral part of a complete examination. Limited examinations for reoccurring indications may be performed as noted.  Right Carotid Findings: +----------+--------+--------+--------+------------------+--------+  PSV cm/sEDV cm/sStenosisPlaque DescriptionComments +----------+--------+--------+--------+------------------+--------+ CCA Prox  92      10              heterogenous               +----------+--------+--------+--------+------------------+--------+ CCA Distal80      16              heterogenous               +----------+--------+--------+--------+------------------+--------+ ICA Prox  59      15      1-39%   heterogenous               +----------+--------+--------+--------+------------------+--------+ ICA Distal86      19                                         +----------+--------+--------+--------+------------------+--------+ ECA       99                                                 +----------+--------+--------+--------+------------------+--------+ +----------+--------+-------+--------+-------------------+           PSV cm/sEDV cmsDescribeArm Pressure (mmHG) +----------+--------+-------+--------+-------------------+ Subclavian109                                        +----------+--------+-------+--------+-------------------+  +---------+--------+--+--------+--+---------+ VertebralPSV cm/s58EDV cm/s12Antegrade +---------+--------+--+--------+--+---------+  Left Carotid Findings: +----------+--------+--------+--------+------------------+--------+           PSV cm/sEDV cm/sStenosisPlaque DescriptionComments +----------+--------+--------+--------+------------------+--------+ CCA Prox  98      11              heterogenous               +----------+--------+--------+--------+------------------+--------+ CCA Distal64      12              heterogenous               +----------+--------+--------+--------+------------------+--------+ ICA Prox  55      13      1-39%   heterogenous               +----------+--------+--------+--------+------------------+--------+ ICA Distal62      13                                         +----------+--------+--------+--------+------------------+--------+ ECA       81                                                 +----------+--------+--------+--------+------------------+--------+ +----------+--------+--------+--------+-------------------+           PSV cm/sEDV cm/sDescribeArm Pressure (mmHG) +----------+--------+--------+--------+-------------------+ [id number]                                          +----------+--------+--------+--------+-------------------+ +---------+--------+--+--------+--+---------+ VertebralPSV cm/s53EDV cm/s10Antegrade +---------+--------+--+--------+--+---------+   Summary: Right Carotid: Velocities in the right ICA are consistent with a  1-39% stenosis. Left Carotid: Velocities in the left ICA are consistent with a 1-39% stenosis. Vertebrals: Bilateral vertebral arteries demonstrate antegrade flow. *See table(s) above for measurements and observations.  Electronically signed by Antony Contras MD on 02/01/2021 at 8:15:25 AM.    Final     Labs:  CBC: Recent Labs    01/31/21 0645 02/01/21 0036 02/03/21 0500  02/06/21 0516  WBC 9.5 11.3* 8.6 9.0  HGB 11.3* 9.6* 8.8* 8.4*  HCT 38.8* 33.6* 30.3* 28.5*  PLT 171 231 265 296    COAGS: Recent Labs    01/23/21 1537  INR 1.3*  APTT 32    BMP: Recent Labs    01/31/21 0645 02/01/21 0036 02/03/21 0500 02/06/21 0516  NA 153* 150* 144 139  K 4.0 3.9 3.9 4.2  CL 116* 119* 110 106  CO2 26 26 28 28   GLUCOSE 73 159* 159* 110*  BUN 35* 36* 31* 27*  CALCIUM 9.5 9.3 8.8* 9.1  CREATININE 1.08 1.09 1.14 1.07  GFRNONAA >60 >60 >60 >60    LIVER FUNCTION TESTS: Recent Labs    11/14/20 1345 01/23/21 1537 01/27/21 1737  BILITOT 0.7 0.8 1.1  AST 20 23 34  ALT 17 21 21   ALKPHOS 58 55 52  PROT 7.6 6.7 6.7  ALBUMIN 4.5 4.0 3.3*    TUMOR MARKERS: No results for input(s): AFPTM, CEA, CA199, CHROMGRNA in the last 8760 hours.  Assessment and Plan:  Scheduled for percutaneous gastric tube placement in IR tentative for 5/25 Risks and benefits image guided gastrostomy tube placement was discussed with the patient's daughter Ennis Forts via phone including, but not limited to the need for a barium enema during the procedure, bleeding, infection, peritonitis and/or damage to adjacent structures.  All questions were answered,  She is agreeable to proceed. Consent signed and in chart.   Thank you for this interesting consult.  I greatly enjoyed meeting Sherard K Wehling and look forward to participating in their care.  A copy of this report was sent to the requesting provider on this date.  Electronically Signed: Lavonia Drafts, PA-C 02/07/2021, 9:12 AM   I spent a total of 40 Minutes    in face to face in clinical consultation, greater than 50% of which was counseling/coordinating care for percutaneous gastric tube placement

## 2021-02-07 NOTE — Progress Notes (Signed)
  Speech Language Pathology Treatment: Dysphagia  Patient Details Name: Brian [name] MRN: [id number] DOB: 1935/12/27 Today's Date: 02/07/2021 Time: 1152-1201 SLP Time Calculation (min) (ACUTE ONLY): 9 min  Assessment / Plan / Recommendation Clinical Impression  Pt is very lethargic this morning and not able to be adequately aroused despite Max multimodal cues and stimulation. He showed minimal reaction to oral care and to repositioning. RN aware and said that he has not received any sedating medications, but has been this lethargic throughout the morning. No PO trials were administered in light of significant dysphagia and decreased LOA. Will continue to follow.    HPI HPI: Pt is a 85 y.o. M who presents with acute onset R sided weakness, facial droop, numbness, aphasia with left gaze preference. CTH showing L thalamic ICH. Significant PMH: afib, DM2, HLD, HTN, stroke      SLP Plan  Continue with current plan of care;MBS       Recommendations  Diet recommendations: NPO Medication Administration: Via alternative means                Oral Care Recommendations: Oral care QID Follow up Recommendations: Skilled Nursing facility SLP Visit Diagnosis: Dysphagia, oropharyngeal phase (R13.12) Plan: Continue with current plan of care;MBS       GO                Osie Bond., M.A. [place] [facility] Pager [phone] Office [phone]  02/07/2021, 12:04 PM

## 2021-02-08 ENCOUNTER — Inpatient Hospital Stay (HOSPITAL_COMMUNITY): Payer: Medicare HMO

## 2021-02-08 DIAGNOSIS — I610 Nontraumatic intracerebral hemorrhage in hemisphere, subcortical: Secondary | ICD-10-CM | POA: Diagnosis not present

## 2021-02-08 DIAGNOSIS — I169 Hypertensive crisis, unspecified: Secondary | ICD-10-CM | POA: Diagnosis not present

## 2021-02-08 DIAGNOSIS — I471 Supraventricular tachycardia: Secondary | ICD-10-CM | POA: Diagnosis not present

## 2021-02-08 HISTORY — PX: IR GASTROSTOMY TUBE MOD SED: IMG625

## 2021-02-08 LAB — BASIC METABOLIC PANEL
Anion gap: 9 (ref 5–15)
BUN: 32 mg/dL — ABNORMAL HIGH (ref 8–23)
CO2: 27 mmol/L (ref 22–32)
Calcium: 9.4 mg/dL (ref 8.9–10.3)
Chloride: 103 mmol/L (ref 98–111)
Creatinine, Ser: 0.98 mg/dL (ref 0.61–1.24)
GFR, Estimated: >60 mL/min (ref >60)
Glucose, Bld: 108 mg/dL — ABNORMAL HIGH (ref 70–99)
Potassium: 4.2 mmol/L (ref 3.5–5.1)
Sodium: 139 mmol/L (ref 135–145)

## 2021-02-08 LAB — CBC
HCT: 32.4 % — ABNORMAL LOW (ref 39.0–52.0)
Hemoglobin: 9.5 g/dL — ABNORMAL LOW (ref 13.0–17.0)
MCH: 25.7 pg — ABNORMAL LOW (ref 26.0–34.0)
MCHC: 29.3 g/dL — ABNORMAL LOW (ref 30.0–36.0)
MCV: 87.8 fL (ref 80.0–100.0)
Platelets: 312 10*3/uL (ref 150–400)
RBC: 3.69 MIL/uL — ABNORMAL LOW (ref 4.22–5.81)
RDW: 15.6 % — ABNORMAL HIGH (ref 11.5–15.5)
WBC: 9.4 10*3/uL (ref 4.0–10.5)
nRBC: 2.7 % — ABNORMAL HIGH (ref 0.0–0.2)

## 2021-02-08 LAB — PROTIME-INR
INR: 1.1 (ref 0.8–1.2)
Prothrombin Time: 14.1 seconds (ref 11.4–15.2)

## 2021-02-08 LAB — GLUCOSE, CAPILLARY
Glucose-Capillary: 104 mg/dL — ABNORMAL HIGH (ref 70–99)
Glucose-Capillary: 111 mg/dL — ABNORMAL HIGH (ref 70–99)
Glucose-Capillary: 111 mg/dL — ABNORMAL HIGH (ref 70–99)
Glucose-Capillary: 154 mg/dL — ABNORMAL HIGH (ref 70–99)
Glucose-Capillary: 189 mg/dL — ABNORMAL HIGH (ref 70–99)
Glucose-Capillary: 239 mg/dL — ABNORMAL HIGH (ref 70–99)

## 2021-02-08 LAB — SURGICAL PCR SCREEN
MRSA, PCR: NEGATIVE
Staphylococcus aureus: NEGATIVE

## 2021-02-08 MED ORDER — GLUCAGON HCL RDNA (DIAGNOSTIC) 1 MG IJ SOLR
INTRAMUSCULAR | Status: AC
  Filled 2021-02-08: qty 1

## 2021-02-08 MED ORDER — LIDOCAINE HCL 1 % IJ SOLN
INTRAMUSCULAR | Status: AC | PRN
  Administered 2021-02-08: 7 mL

## 2021-02-08 MED ORDER — MIDAZOLAM HCL 2 MG/2ML IJ SOLN
INTRAMUSCULAR | Status: AC | PRN
  Administered 2021-02-08: 0.5 mg via INTRAVENOUS

## 2021-02-08 MED ORDER — MIDAZOLAM HCL 2 MG/2ML IJ SOLN
INTRAMUSCULAR | Status: AC
  Filled 2021-02-08: qty 2

## 2021-02-08 MED ORDER — GLUCAGON HCL RDNA (DIAGNOSTIC) 1 MG IJ SOLR
INTRAMUSCULAR | Status: AC | PRN
  Administered 2021-02-08: 1 mg via INTRAVENOUS

## 2021-02-08 MED ORDER — MIDAZOLAM HCL 5 MG/5ML IJ SOLN
INTRAMUSCULAR | Status: AC | PRN
  Administered 2021-02-08: 0.5 mg via INTRAVENOUS

## 2021-02-08 MED ORDER — CEFAZOLIN SODIUM-DEXTROSE 2-4 GM/100ML-% IV SOLN
INTRAVENOUS | Status: AC | PRN
  Administered 2021-02-08: 2 g via INTRAVENOUS

## 2021-02-08 MED ORDER — FENTANYL CITRATE (PF) 100 MCG/2ML IJ SOLN
INTRAMUSCULAR | Status: AC | PRN
  Administered 2021-02-08 (×2): 25 ug via INTRAVENOUS

## 2021-02-08 MED ORDER — CHLORHEXIDINE GLUCONATE CLOTH 2 % EX PADS
6.0000 | MEDICATED_PAD | Freq: Every day | CUTANEOUS | Status: DC
  Administered 2021-02-08 – 2021-02-14 (×6): 6 via TOPICAL

## 2021-02-08 MED ORDER — CEFAZOLIN SODIUM-DEXTROSE 2-4 GM/100ML-% IV SOLN
INTRAVENOUS | Status: AC
  Filled 2021-02-08: qty 100

## 2021-02-08 MED ORDER — FENTANYL CITRATE (PF) 100 MCG/2ML IJ SOLN
INTRAMUSCULAR | Status: AC
  Filled 2021-02-08: qty 2

## 2021-02-08 MED ORDER — LIDOCAINE HCL (PF) 1 % IJ SOLN
INTRAMUSCULAR | Status: AC
  Filled 2021-02-08: qty 30

## 2021-02-08 NOTE — Progress Notes (Addendum)
MEDICATION-RELATED CONSULT NOTE   IR Procedure Consult - Anticoagulant/Antiplatelet PTA/Inpatient Med List Review by Pharmacist   Procedure: fluoroscopic guided insertion of gastrostomy tube    Completed: ~9:30am 02/08/21  Post-Procedural bleeding risk per IR MD assessment:  standard  Antithrombotic medications on inpatient or PTA profile prior to procedure:   Lovenox 40 mg SQ q24hrs for VTE prophlyaxis  - was on Apixaban PTA, reversed with Andexxa on admit 5/9 d/t ICH.  - Lovenox for VTE prophylaxis begun 5/14 per Stroke team  - 5/18 CT head: slowly evolving left thalamic hemorrhage, mildly decreased in volume since 01/23/21   Recommended restart time per IR Post-Procedure Guidelines:     - following day  Plan:      Lovenox 40 mg SQ q24h already scheduled to resume 5/26 am.  Discussed briefly with Dr. Ree Kida, who will review.   Staying off Eliquis.  Arty Baumgartner, [place] 02/08/2021 11:11 AM

## 2021-02-08 NOTE — Progress Notes (Signed)
Physical Therapy Treatment Patient Details Name: Brian [name] MRN: [id number] DOB: 16-Oct-1935 Today's Date: 02/08/2021    History of Present Illness Pt is a 85 y.o. M who presents with acute onset R sided weakness, facial droop, numbness, aphasia with left gaze preference. CTH showing L thalamic ICH. Significant PMH: afib, DM2, HLD, HTN, stroke.    PT Comments    Patient not progressing towards physical therapy goals. Patient not following commands and no verbalizations this session. Patient continues to require totalA to maintain sitting balance due to pushing posterior and to R. Worked on propping on L elbow with slight improvement in midline sitting balance but continued to push after 30 seconds. Continue to recommend SNF for ongoing Physical Therapy.       Follow Up Recommendations  SNF     Equipment Recommendations  Other (comment) (defer)    Recommendations for Other Services       Precautions / Restrictions Precautions Precautions: Fall;Other (comment) Precaution Comments: R hemiparesis, global aphasia, cortrak Restrictions Weight Bearing Restrictions: No    Mobility  Bed Mobility Overal bed mobility: Needs Assistance Bed Mobility: Supine to Sit;Sit to Supine     Supine to sit: Total assist;+2 for physical assistance;+2 for safety/equipment Sit to supine: Total assist;+2 for physical assistance;+2 for safety/equipment   General bed mobility comments: totalA+2 for all aspects of bed mobility with no attempts to initiate movement.    Transfers                 General transfer comment: deferred due to poor sitting balance  Ambulation/Gait                 Stairs             Wheelchair Mobility    Modified Rankin (Stroke Patients Only) Modified Rankin (Stroke Patients Only) Pre-Morbid Rankin Score: Slight disability Modified Rankin: Severe disability     Balance Overall balance assessment: Needs assistance Sitting-balance support:  Feet supported;Single extremity supported Sitting balance-Leahy Scale: Zero Sitting balance - Comments: totalA to maintain sitting EOB. Pushing posteiorly at times but improved following lateral lean on L elbow Postural control: Posterior lean;Right lateral lean                                  Cognition Arousal/Alertness: Lethargic Behavior During Therapy: Flat affect Overall Cognitive Status: Difficult to assess Area of Impairment: Following commands                       Following Commands: Follows one step commands inconsistently       General Comments: Not following commands this session. No verbalizations noted      Exercises Other Exercises Other Exercises: PROM cervical rotation and extension Other Exercises: Lateral propping on L elbow to promote WBing    General Comments        Pertinent Vitals/Pain Pain Assessment: Faces Faces Pain Scale: No hurt Pain Intervention(s): Monitored during session    Home Living                      Prior Function            PT Goals (current goals can now be found in the care plan section) Acute Rehab PT Goals Patient Stated Goal: none stated PT Goal Formulation: Patient unable to participate in goal setting Time For Goal Achievement: 02/22/21 Potential to Achieve  Goals: Fair Progress towards PT goals: Not progressing toward goals - comment    Frequency    Min 3X/week      PT Plan Current plan remains appropriate    Co-evaluation              AM-PAC PT "6 Clicks" Mobility   Outcome Measure  Help needed turning from your back to your side while in a flat bed without using bedrails?: Total Help needed moving from lying on your back to sitting on the side of a flat bed without using bedrails?: Total Help needed moving to and from a bed to a chair (including a wheelchair)?: Total Help needed standing up from a chair using your arms (e.g., wheelchair or bedside chair)?:  Total Help needed to walk in hospital room?: Total Help needed climbing 3-5 steps with a railing? : Total 6 Click Score: 6    End of Session   Activity Tolerance: Patient tolerated treatment well Patient left: in bed;with call bell/phone within reach;with bed alarm set Nurse Communication: Mobility status PT Visit Diagnosis: Other abnormalities of gait and mobility (R26.89);Hemiplegia and hemiparesis Hemiplegia - Right/Left: Right Hemiplegia - dominant/non-dominant: Dominant Hemiplegia - caused by: Nontraumatic intracerebral hemorrhage     Time: 6568-1275 PT Time Calculation (min) (ACUTE ONLY): 25 min  Charges:  $Therapeutic Activity: 8-22 mins $Neuromuscular Re-education: 8-22 mins                     Noele Icenhour A. Gilford Rile PT, DPT Acute Rehabilitation Services Pager [phone] Office [phone]    Linna Hoff 02/08/2021, 4:40 PM

## 2021-02-08 NOTE — TOC Progression Note (Signed)
Transition of Care [facility]) - Progression Note    Patient Details  Name: Brian [name] MRN: [id number] Date of Birth: Aug 22, 1936  Transition of Care [facility]) CM/SW [place], [place] Phone Number: 02/08/2021, 3:09 PM  Clinical Narrative:   CSW attempted to contact patient's son, [name], to provide only bed offer for SNF at [place]. CSW left a voicemail for [facility] to call back. No other bed offers at this time. CSW to follow.    Expected Discharge Plan: [place] Barriers to Discharge: Insurance Authorization,Continued Medical Work up  Expected Discharge Plan and Services Expected Discharge Plan: [place] In-house Referral: Clinical Social Work   Post Acute Care Choice: [place] Living arrangements for the past 2 months: Single Family Home                                       Social Determinants of Health (SDOH) Interventions    Readmission Risk Interventions No flowsheet data found.

## 2021-02-08 NOTE — Procedures (Signed)
Pre procedure Dx: Dysphagia Post Procedure Dx: Same  Successful fluoroscopic guided insertion of gastrostomy tube.   The gastrostomy tube may be used immediately for medications.   Tube feeds may be initiated in 24 hours as per the primary team.    EBL: None Complications: None immediate  Jay Yoshua [place], MD Pager #: [phone]    

## 2021-02-08 NOTE — Progress Notes (Signed)
PROGRESS NOTE    Brian [name]  JSH:[id number] DOB: [birth year] DOA: 01/23/2021 PCP: [name], MD   Brief Narrative:  HPI on 01/23/2021 by Dr. Donnetta Simpers Keawe Brian [name] is a 85 y.o. male with PMH significant for Afibb on eliquis, DM2, HLD, HTN, obesity, prior L MCA stroke who presents with acute onset R sided weakness + R facial droop + R sided numbness + aphasia with left gaze preference. He is unable to provide any meaningful hx secondary to global aphasia.  Per EMS, was at a car wash, he was talking to the staff there and started having acute onset R sided weakness and language deficit. LKW of [place] on 01/23/21.  Workup with CTH w/o contrast with a L thalamic ICH.  I was unable to get in touch with the listed family members [name] or Brian [name].  LKW: 1445 NIHSS: 21 MRS: Presumed to be 0 given he was walking at the car wash and drove there himself. ICH score: 2(Age > 80 and some Intraventricular extension)  Interim history On admission.  Patient was noted to have left thalamic intracranial hemorrhage with cytotoxic edema with 6 mm left-to-right shift.  Patient was on Eliquis which was reversed with Andexanet alpha.  Patient also required cleviprex infusion for hypertensive emergency through 01/26/2021.  He was admitted to neuro ICU under stroke service.  Patient developed fever and lethargy on 5/12.  He had failed his swallow evaluation and cortrak was placed.  IR consulted status post PEG placement 5/25.  Patient continued to have persistent low-grade fever however leukocytosis was trending downward.  EEG showed focal left-sided slowing but no focal seizures.  Repeat imaging showed an evolving intracranial hemorrhage, neurology felt patient had poor prognosis.  He was transferred out of the ICU and hospitalist assumed care on 5/18.  Long-term plan for disposition to SNF. Assessment & Plan   ICH with cerebral edema -Present on admission -Patient on Eliquis prior to admission.  Was  given reversal agent- Andexanet alpha -Imaging showed left thalamic ICH with 7 mm midline shift on initial imaging -Patient initially admitted by neurology however they signed off on 01/2021 -Repeat imaging showed increased size on 5/15, decreased size on 5/16, and stable on 5/18 images -No further recommendations, patient will need to follow-up with Dr. Leonie Man in 4 to 6 weeks -Continue statin -Echocardiogram showed an EF of 55 to 60% -PT OT recommended SNF placement  Hypertensive emergency -Resolved -Patient initially needed cleviprex infusion -SBP goal less than 160, patient currently at goal -Continue Coreg, losartan, amlodipine.  Continue labetalol and hydralazine as needed  Sustained atrial tachycardia -Resolving -Cardiology consulted and appreciated -Patient had developed 2 episodes of SVT on 5/16 -Continue amiodarone, Coreg  History of atrial fibrillation -As above, patient was on Eliquis however this was reversed on arrival -Anticoagulation held due to [place]  Possible aspiration pneumonia -Resolved -Patient completed 7 days of Unasyn -Continue aspiration precautions  Dysphagia -Secondary to mental status and stroke -Interventional radiology consulted and appreciated, status post PEG placement today -Continue aspiration precaution -Speech therapy following  Diabetes mellitus, type II -Continue Levemir, insulin sliding scale and CBG monitoring  Acute anemia-normocytic/hypochromic -Likely due to critical illness -Monitor H&H, transfuse if below 7   DVT Prophylaxis  SCDs  Code Status: Full  Family Communication: None at bedside  Disposition Plan:  Status is: Inpatient  Remains inpatient appropriate because:Inpatient level of care appropriate due to severity of illness   Dispo: The patient is from: Home  Anticipated d/c is to: SNF              Patient currently is not medically stable to d/c.   Difficult to place patient  No  Consultants Neurology Cardiology Interventional radiology  Procedures  Echocardiogram Carotid ultrasound Gastrostomy tube placement 5/25  Antibiotics   Anti-infectives (From admission, onward)   Start     Dose/Rate Route Frequency Ordered Stop   02/09/21 0600  ceFAZolin (ANCEF) IVPB 2g/100 mL premix  Status:  Discontinued        2 g 200 mL/hr over 30 Minutes Intravenous To Radiology 02/07/21 0924 02/07/21 0935   02/08/21 0850  ceFAZolin (ANCEF) IVPB 2g/100 mL premix        over 30 Minutes Intravenous Continuous PRN 02/08/21 0908 02/08/21 0850   02/08/21 0600  ceFAZolin (ANCEF) IVPB 2g/100 mL premix        2 g 200 mL/hr over 30 Minutes Intravenous To Radiology 02/07/21 0935 02/08/21 0604   01/27/21 2015  Ampicillin-Sulbactam (UNASYN) 3 g in sodium chloride 0.9 % 100 mL IVPB  Status:  Discontinued        3 g 200 mL/hr over 30 Minutes Intravenous Every 8 hours 01/27/21 1917 01/27/21 1918   01/27/21 1945  Ampicillin-Sulbactam (UNASYN) 3 g in sodium chloride 0.9 % 100 mL IVPB        3 g 200 mL/hr over 30 Minutes Intravenous Every 6 hours 01/27/21 1918 02/03/21 1000      Subjective:   Glade Guay seen and examined today.   No acute issues overnight.  Patient nonverbal and noninteractive today.  Objective:   Vitals:   02/08/21 0915 02/08/21 0920 02/08/21 0935 02/08/21 1138  BP: (!) 168/74 (!) 149/68 (!) 146/64 135/63  Pulse: 65 64 68 (!) 57  Resp: 18 16 20 14   Temp:    98.3 F (36.8 C)  TempSrc:    Oral  SpO2: 99% 96% 95% 100%  Weight:      Height:        Intake/Output Summary (Last 24 hours) at 02/08/2021 1311 Last data filed at 02/08/2021 1008 Gross per 24 hour  Intake 71.68 ml  Output 2950 ml  Net -2878.32 ml   Filed Weights   02/05/21 0500 02/07/21 0446 02/08/21 0500  Weight: 107.9 kg 103.3 kg 99.1 kg    Exam  General: Well developed, elderly, chronically ill-appearing, NAD  HEENT: NCAT, mucous membranes moist.  Core track in place  Cardiovascular:  S1 S2 auscultated, RRR  Respiratory: Clear to auscultation bilaterally with equal chest rise  Abdomen: Soft, nontender, nondistended, + bowel sounds  Extremities: warm dry without cyanosis clubbing or edema  Neuro: Awake however cannot assess orientation as patient currently is not interactive and nonverbal.  Patient does track but will not follow any commands. Does move the left side.  Psych: cannot assess   Data Reviewed: I have personally reviewed following labs and imaging studies  CBC: Recent Labs  Lab 02/03/21 0500 02/06/21 0516 02/08/21 0448  WBC 8.6 9.0 9.4  HGB 8.8* 8.4* 9.5*  HCT 30.3* 28.5* 32.4*  MCV 89.4 88.0 87.8  PLT 265 296 283   Basic Metabolic Panel: Recent Labs  Lab 02/03/21 0500 02/06/21 0516 02/08/21 0448  NA 144 139 139  K 3.9 4.2 4.2  CL 110 106 103  CO2 28 28 27   GLUCOSE 159* 110* 108*  BUN 31* 27* 32*  CREATININE 1.14 1.07 0.98  CALCIUM 8.8* 9.1 9.4   GFR: Estimated Creatinine Clearance: 69.6  mL/min (by C-G formula based on SCr of 0.98 mg/dL). Liver Function Tests: No results for input(s): AST, ALT, ALKPHOS, BILITOT, PROT, ALBUMIN in the last 168 hours. No results for input(s): LIPASE, AMYLASE in the last 168 hours. No results for input(s): AMMONIA in the last 168 hours. Coagulation Profile: Recent Labs  Lab 02/08/21 0448  INR 1.1   Cardiac Enzymes: No results for input(s): CKTOTAL, CKMB, CKMBINDEX, TROPONINI in the last 168 hours. BNP (last 3 results) No results for input(s): PROBNP in the last 8760 hours. HbA1C: No results for input(s): HGBA1C in the last 72 hours. CBG: Recent Labs  Lab 02/07/21 2138 02/08/21 0007 02/08/21 0500 02/08/21 0737 02/08/21 1136  GLUCAP 98 111* 104* 111* 154*   Lipid Profile: No results for input(s): CHOL, HDL, LDLCALC, TRIG, CHOLHDL, LDLDIRECT in the last 72 hours. Thyroid Function Tests: No results for input(s): TSH, T4TOTAL, FREET4, T3FREE, THYROIDAB in the last 72 hours. Anemia  Panel: No results for input(s): VITAMINB12, FOLATE, FERRITIN, TIBC, IRON, RETICCTPCT in the last 72 hours. Urine analysis:    Component Value Date/Time   COLORURINE AMBER (A) 01/26/2021 1054   APPEARANCEUR HAZY (A) 01/26/2021 1054   LABSPEC 1.026 01/26/2021 1054   PHURINE 5.0 01/26/2021 1054   GLUCOSEU 150 (A) 01/26/2021 1054   GLUCOSEU 500 (A) 11/14/2020 1345   HGBUR NEGATIVE 01/26/2021 1054   [place] 01/26/2021 1054   [place] 01/26/2021 1054   PROTEINUR 30 (A) 01/26/2021 1054   UROBILINOGEN 0.2 11/14/2020 1345   NITRITE NEGATIVE 01/26/2021 1054   LEUKOCYTESUR NEGATIVE 01/26/2021 1054   Sepsis Labs: @LABRCNTIP (procalcitonin:4,lacticidven:4)  ) Recent Results (from the past 240 hour(s))  Surgical PCR screen     Status: None   Collection Time: 02/08/21  3:51 AM   Specimen: Nasal Mucosa; Nasal Swab  Result Value Ref Range Status   MRSA, PCR NEGATIVE NEGATIVE Final   Staphylococcus aureus NEGATIVE NEGATIVE Final    Comment: (NOTE) The Xpert SA Assay (FDA approved for NASAL specimens in patients 77 years of age and older), is one component of a comprehensive surveillance program. It is not intended to diagnose infection nor to guide or monitor treatment. Performed at [facility], [place] [address] [place], [place] [address]       Radiology Studies: CT ABDOMEN WO CONTRAST  Result Date: 02/06/2021 CLINICAL DATA:  Evaluate anatomy for G-tube placement EXAM: CT ABDOMEN WITHOUT CONTRAST TECHNIQUE: Multidetector CT imaging of the abdomen was performed following the standard protocol without IV contrast. COMPARISON:  None. FINDINGS: Lower chest: No acute abnormality Hepatobiliary: No focal liver abnormality is seen. No gallstones, gallbladder wall thickening, or biliary dilatation. Pancreas: Pancreas is diffusely atrophic. Spleen: Normal in size without focal abnormality. Adrenals/Urinary Tract: Adrenal glands are normal. 1.4 cm nonobstructing calculus  seen in the lower pole of the right kidney. The kidneys in visualized ureteral segment otherwise unremarkable. Stomach/Bowel: Nasogastric tube terminates within the gastric lumen. There is a calcified mass along the posterior fundal wall measuring 2.0 x 1.5 x 1.8 cm. Visualized small and large bowel normal in caliber and distribution. Vascular/Lymphatic: No enlarged retroperitoneal or or mesenteric lymph nodes. Scattered atheromatous plaque of the abdominal aorta and visualized branches. Other: No abdominal wall hernia or abnormality. Musculoskeletal: No acute or significant osseous findings. IMPRESSION: 1. Gastric anatomy is amenable to percutaneous gastrostomy tube placement. 2. 2.0 x 1.8 x 1.5 cm high density structure noted along the posterior gastric fundus may represent a diverticulum containing oral contrast or alternatively could represent a  calcified gastric neoplasm such as GIST. Follow-up contrast enhance abdominal CT should be performed in 2-3 months to again evaluate this structure. 3. 1.4 cm nonobstructing calculus seen in the lower pole the right kidney. Electronically Signed   By: Miachel Roux M.D.   On: 02/06/2021 16:18   IR GASTROSTOMY TUBE MOD SED  Result Date: 02/08/2021 INDICATION: History of thalamic hemorrhage now with dysphagia. Please perform percutaneous gastrostomy tube placement for enteric nutrition supplementation purposes. EXAM: PULL TROUGH GASTROSTOMY TUBE PLACEMENT COMPARISON:  Abdominal CT-02/06/2021 MEDICATIONS: Ancef 2 gm IV; Antibiotics were administered within 1 hour of the procedure. Glucagon 1 mg IV CONTRAST:  None - air was utilized as a contrast agent given current contrast shortage an adequate fluoroscopic visualization ANESTHESIA/SEDATION: Moderate (conscious) sedation was employed during this procedure. A total of Versed 1 mg and Fentanyl 50 mcg was administered intravenously. Moderate Sedation Time: 22 minutes. The patient's level of consciousness and vital signs  were monitored continuously by radiology nursing throughout the procedure under my direct supervision. FLUOROSCOPY TIME:  8 minutes, 36 seconds (83 mGy) COMPLICATIONS: None immediate. PROCEDURE: Informed written consent was obtained from the patient's family following explanation of the procedure, risks, benefits and alternatives. A time out was performed prior to the initiation of the procedure. Ultrasound scanning was performed to demarcate the edge of the left lobe of the liver. Maximal barrier sterile technique utilized including caps, mask, sterile gowns, sterile gloves, large sterile drape, hand hygiene and Betadine prep. The left upper quadrant was sterilely prepped and draped. An oral gastric catheter was inserted into the stomach under fluoroscopy. The existing nasogastric feeding tube was removed. The left costal margin and air opacified transverse colon were identified and avoided. Air was injected into the stomach for insufflation and visualization under fluoroscopy. Under sterile conditions a 17 gauge trocar needle was utilized to access the stomach percutaneously beneath the left subcostal margin after the overlying soft tissues were anesthetized with 1% Lidocaine with epinephrine. Needle position was confirmed within the stomach with aspiration of air. A single T tack was deployed for gastropexy. Over an Amplatz guide wire, a 9-French sheath was inserted into the stomach. A snare device was utilized to capture the oral gastric catheter. The snare device was pulled retrograde from the stomach up the esophagus and out the oropharynx. The 20-French pull-through gastrostomy was connected to the snare device and pulled antegrade through the oropharynx down the esophagus into the stomach and then through the percutaneous tract external to the patient. The gastrostomy was assembled externally. Air injection confirms appropriate positioning within the stomach. Several spot radiographic images were obtained in  various obliquities for documentation. The patient tolerated procedure well without immediate post procedural complication. FINDINGS: After successful fluoroscopic guided placement, the gastrostomy tube is appropriately positioned with internal disc against the ventral aspect of the gastric lumen. IMPRESSION: Successful fluoroscopic insertion of a 20-French pull-through gastrostomy tube. The gastrostomy may be used immediately for medication administration and in 24 hrs for the initiation of feeds. Electronically Signed   By: Sandi Mariscal M.D.   On: 02/08/2021 09:50     Scheduled Meds: .  stroke: mapping our early stages of recovery book   Does not apply Once  . amiodarone  400 mg Per Tube Daily   Followed by  . [START ON 02/28/2021] amiodarone  200 mg Per Tube Daily  . amLODipine  10 mg Per Tube Daily  . carvedilol  25 mg Per Tube BID WC  . chlorhexidine  15 mL  Mouth Rinse BID  . Chlorhexidine Gluconate Cloth  6 each Topical Daily  . Chlorhexidine Gluconate Cloth  6 each Topical Q0600  . [START ON 02/09/2021] enoxaparin (LOVENOX) injection  40 mg Subcutaneous Q24H  . feeding supplement (PROSource TF)  45 mL Per Tube TID  . free water  100 mL Per Tube Q4H  . gabapentin  100 mg Per Tube TID  . glucagon (human recombinant)      . insulin aspart  3-9 Units Subcutaneous Q4H  . insulin detemir  25 Units Subcutaneous BID  . lidocaine (PF)      . losartan  50 mg Per Tube BID  . mouth rinse  15 mL Mouth Rinse q12n4p  . nutrition supplement (JUVEN)  1 packet Per Tube BID BM  . pantoprazole sodium  40 mg Per Tube QHS  . senna-docusate  1 tablet Per Tube BID   Continuous Infusions: . feeding supplement (JEVITY 1.5 CAL/FIBER) Stopped (02/07/21 2352)     LOS: 16 days   Time Spent in minutes   45 minutes  Marvion Bastidas D.O. on 02/08/2021 at 1:11 PM  Between 7am to 7pm - Please see pager noted on [email or web address]  After 7pm go to [email or web address]  And look for the night coverage person covering for me  after hours  [facility] Office  [phone]

## 2021-02-08 NOTE — Plan of Care (Signed)
  Problem: Health Behavior/Discharge Planning: Goal: Ability to manage health-related needs will improve Outcome: Progressing   Problem: Clinical Measurements: Goal: Ability to maintain clinical measurements within normal limits will improve Outcome: Progressing Goal: Will remain free from infection Outcome: Progressing Goal: Diagnostic test results will improve Outcome: Progressing Goal: Respiratory complications will improve Outcome: Progressing Goal: Cardiovascular complication will be avoided Outcome: Progressing   Problem: Nutrition: Goal: Adequate nutrition will be maintained Outcome: Progressing   Problem: Education: Goal: Knowledge of disease or condition will improve Outcome: Progressing Goal: Knowledge of secondary prevention will improve Outcome: Progressing Goal: Knowledge of patient specific risk factors addressed and post discharge goals established will improve Outcome: Progressing   Problem: Intracerebral Hemorrhage Tissue Perfusion: Goal: Complications of Intracerebral Hemorrhage will be minimized Outcome: Progressing   Problem: Education: Goal: Knowledge of disease or condition will improve Outcome: Progressing Goal: Knowledge of secondary prevention will improve Outcome: Progressing Goal: Knowledge of patient specific risk factors addressed and post discharge goals established will improve Outcome: Progressing Goal: Individualized Educational Video(s) Outcome: Progressing   Problem: Health Behavior/Discharge Planning: Goal: Ability to manage health-related needs will improve Outcome: Progressing   Problem: Self-Care: Goal: Ability to participate in self-care as condition permits will improve Outcome: Progressing   Problem: Nutrition: Goal: Risk of aspiration will decrease Outcome: Progressing Goal: Dietary intake will improve Outcome: Progressing   Problem: Intracerebral Hemorrhage Tissue Perfusion: Goal: Complications of Intracerebral  Hemorrhage will be minimized Outcome: Progressing

## 2021-02-09 DIAGNOSIS — I610 Nontraumatic intracerebral hemorrhage in hemisphere, subcortical: Secondary | ICD-10-CM | POA: Diagnosis not present

## 2021-02-09 DIAGNOSIS — I169 Hypertensive crisis, unspecified: Secondary | ICD-10-CM | POA: Diagnosis not present

## 2021-02-09 DIAGNOSIS — I471 Supraventricular tachycardia: Secondary | ICD-10-CM | POA: Diagnosis not present

## 2021-02-09 LAB — GLUCOSE, CAPILLARY
Glucose-Capillary: 122 mg/dL — ABNORMAL HIGH (ref 70–99)
Glucose-Capillary: 125 mg/dL — ABNORMAL HIGH (ref 70–99)
Glucose-Capillary: 128 mg/dL — ABNORMAL HIGH (ref 70–99)
Glucose-Capillary: 164 mg/dL — ABNORMAL HIGH (ref 70–99)
Glucose-Capillary: 175 mg/dL — ABNORMAL HIGH (ref 70–99)
Glucose-Capillary: 177 mg/dL — ABNORMAL HIGH (ref 70–99)
Glucose-Capillary: 181 mg/dL — ABNORMAL HIGH (ref 70–99)

## 2021-02-09 LAB — BASIC METABOLIC PANEL
Anion gap: 8 (ref 5–15)
BUN: 44 mg/dL — ABNORMAL HIGH (ref 8–23)
CO2: 24 mmol/L (ref 22–32)
Calcium: 8.9 mg/dL (ref 8.9–10.3)
Chloride: 100 mmol/L (ref 98–111)
Creatinine, Ser: 1.1 mg/dL (ref 0.61–1.24)
GFR, Estimated: >60 mL/min (ref >60)
Glucose, Bld: 181 mg/dL — ABNORMAL HIGH (ref 70–99)
Potassium: 4.5 mmol/L (ref 3.5–5.1)
Sodium: 132 mmol/L — ABNORMAL LOW (ref 135–145)

## 2021-02-09 LAB — CBC
HCT: 28.9 % — ABNORMAL LOW (ref 39.0–52.0)
Hemoglobin: 8.5 g/dL — ABNORMAL LOW (ref 13.0–17.0)
MCH: 25.8 pg — ABNORMAL LOW (ref 26.0–34.0)
MCHC: 29.4 g/dL — ABNORMAL LOW (ref 30.0–36.0)
MCV: 87.8 fL (ref 80.0–100.0)
Platelets: 305 10*3/uL (ref 150–400)
RBC: 3.29 MIL/uL — ABNORMAL LOW (ref 4.22–5.81)
RDW: 15.7 % — ABNORMAL HIGH (ref 11.5–15.5)
WBC: 10.4 10*3/uL (ref 4.0–10.5)
nRBC: 1.8 % — ABNORMAL HIGH (ref 0.0–0.2)

## 2021-02-09 NOTE — Progress Notes (Signed)
Physical Therapy Treatment Patient Details Name: Brian [name] MRN: [id number] DOB: 27-Mar-1936 Today's Date: 02/09/2021    History of Present Illness Pt is a 85 y.o. M who presents with acute onset R sided weakness, facial droop, numbness, aphasia with left gaze preference. CTH showing L thalamic ICH. Significant PMH: afib, DM2, HLD, HTN, stroke.    PT Comments    Patient continues to require totalA+2 for bed mobility. Upon sitting, patient requires totalA to maintain sitting EOB. With auditory stimulation of music, patient able to place L hand in lap at times but other times requires assist to redirect L UE from pushing posteriorly. Patient with improved attention/eye contact with therapist with gospel music. Continue to recommend SNF for ongoing Physical Therapy.        Follow Up Recommendations  SNF     Equipment Recommendations  Other (comment) (defer)    Recommendations for Other Services       Precautions / Restrictions Precautions Precautions: Fall;Other (comment) Precaution Comments: R hemiparesis, global aphasia, PEG Restrictions Weight Bearing Restrictions: No    Mobility  Bed Mobility Overal bed mobility: Needs Assistance Bed Mobility: Supine to Sit;Sit to Supine     Supine to sit: Total assist;+2 for physical assistance;+2 for safety/equipment Sit to supine: Total assist;+2 for physical assistance;+2 for safety/equipment   General bed mobility comments: totalA+2 for all aspects of bed mobility. Slight movement of L LE but not following commands    Transfers                    Ambulation/Gait                 Stairs             Wheelchair Mobility    Modified Rankin (Stroke Patients Only) Modified Rankin (Stroke Patients Only) Pre-Morbid Rankin Score: Slight disability Modified Rankin: Severe disability     Balance Overall balance assessment: Needs assistance Sitting-balance support: Feet supported;Single extremity  supported Sitting balance-Leahy Scale: Zero Sitting balance - Comments: totalA to maintain sitting EOB. Improving with auditory stimulation of music and patient able to place L hand in lap at times. Postural control: Posterior lean;Right lateral lean                                  Cognition Arousal/Alertness: Lethargic;Awake/alert Behavior During Therapy: Flat affect Overall Cognitive Status: Difficult to assess Area of Impairment: Following commands                       Following Commands: Follows one step commands inconsistently       General Comments: Not following commands this session. No verbalizations noted. Lethargic initially but with movement became more alert. With gospel music, patient making consistent eye contact with therapist      Exercises Other Exercises Other Exercises: PROM cervical extension Other Exercises: Lateral propping on R elbow to promote WBing    General Comments General comments (skin integrity, edema, etc.): sister present and supportive      Pertinent Vitals/Pain Pain Assessment: Faces Faces Pain Scale: No hurt Pain Intervention(s): Monitored during session    Home Living                      Prior Function            PT Goals (current goals can now be found in the care plan  section) Acute Rehab PT Goals Patient Stated Goal: none stated PT Goal Formulation: Patient unable to participate in goal setting Time For Goal Achievement: 02/22/21 Potential to Achieve Goals: Fair Progress towards PT goals: Progressing toward goals    Frequency    Min 3X/week      PT Plan Current plan remains appropriate    Co-evaluation PT/OT/SLP Co-Evaluation/Treatment: Yes Reason for Co-Treatment: Necessary to address cognition/behavior during functional activity;For patient/therapist safety;To address functional/ADL transfers PT goals addressed during session: Mobility/safety with mobility;Balance         AM-PAC PT "6 Clicks" Mobility   Outcome Measure  Help needed turning from your back to your side while in a flat bed without using bedrails?: Total Help needed moving from lying on your back to sitting on the side of a flat bed without using bedrails?: Total Help needed moving to and from a bed to a chair (including a wheelchair)?: Total Help needed standing up from a chair using your arms (e.g., wheelchair or bedside chair)?: Total Help needed to walk in hospital room?: Total Help needed climbing 3-5 steps with a railing? : Total 6 Click Score: 6    End of Session   Activity Tolerance: Patient tolerated treatment well Patient left: in bed;with call bell/phone within reach;with bed alarm set;with family/visitor present Nurse Communication: Mobility status PT Visit Diagnosis: Other abnormalities of gait and mobility (R26.89);Hemiplegia and hemiparesis Hemiplegia - Right/Left: Right Hemiplegia - dominant/non-dominant: Dominant Hemiplegia - caused by: Nontraumatic intracerebral hemorrhage     Time: 1036-1110 PT Time Calculation (min) (ACUTE ONLY): 34 min  Charges:  $Therapeutic Activity: 8-22 mins                     Brian [name] PT, DPT Acute Rehabilitation Services Pager [phone] Office [phone]    Linna Hoff 02/09/2021, 11:28 AM

## 2021-02-09 NOTE — Progress Notes (Signed)
Patient ID: Brian [name], male   DOB: June 13, 1936, 85 y.o.   MRN: [id number] Pt s/p perc gastrostomy tube placement 5/25; pt asleep; nonverbal; sister in room; temp 99.2; WBC 10.4, hgb 8.5, creat 1.10; G tube intact, insertion site ok, no leaking; ok to use tube as needed

## 2021-02-09 NOTE — TOC Progression Note (Addendum)
Transition of Care [facility]) - Progression Note    Patient Details  Name: TORYN MCCLINTON MRN: [id number] Date of Birth: Apr 16, 1936  Transition of Care [facility]) CM/SW [place], [place] Phone Number: 02/09/2021, 10:24 AM  Clinical Narrative:     CSW called and left message with son hugh.   CSW called daughter and discussed SNF recommendation and that pt had offer at [facility]. Daughter is agreeable to moving forward with [facility] at this time. She is going to discuss plan with her Brother. Daughter states son has had a hard time reaching staff when he calls back. He may respond better to text message.  CSW called Tressa Busman at [facility] to initiate auth. Tressa Busman stated he would have to have pt reviewed further. Concerns are peg tube feedings and wound care.   1122: CSW received call back from [facility]. They can offer a bed and will start insurance auth. CSW provided pts current tube feedings. [facility] Requesting 1-2 days of feedings be sent with pt if possible.   Expected Discharge Plan: [place] Barriers to Discharge: Insurance Authorization,Continued Medical Work up  Expected Discharge Plan and Services Expected Discharge Plan: [place] In-house Referral: Clinical Social Work   Post Acute Care Choice: [place] Living arrangements for the past 2 months: Single Family Home                                       Social Determinants of Health (SDOH) Interventions    Readmission Risk Interventions No flowsheet data found.

## 2021-02-09 NOTE — Progress Notes (Signed)
Occupational Therapy Treatment Patient Details Name: Brian [name] MRN: [id number] DOB: August 21, 1936 Today's Date: 02/09/2021    History of present illness 85 y.o. M who presents with acute onset R sided weakness, facial droop, numbness, aphasia with left gaze preference. CTH showing L thalamic ICH. Significant PMH: afib, DM2, HLD, HTN, stroke.   OT comments  Pt continues to present with decreased balance, strength, coordination, cognition, vision, and communication. Upon arrival, pt supine in bed and lethargic. Increased arousal once at EOB. Pt tolerating sitting at EOB with Max-Total A +2. Pt requiring Total A for grooming task and pushing away from hand over hand assist to engage in washing his face. Facilitating R lateral leans onto R elbow and Max A for correcting back to midline. Pt presenting with decreased pushing to R compared to prior session. Pt with increased engagement and eye contact with music and singing. Continue to recommend dc to SNF and will continue to follow acutely as admitted.   Follow Up Recommendations  SNF;Supervision/Assistance - 24 hour    Equipment Recommendations  Wheelchair (measurements OT);Wheelchair cushion (measurements OT);Hospital bed    Recommendations for Other Services      Precautions / Restrictions Precautions Precautions: Fall;Other (comment) Precaution Comments: R hemiparesis, global aphasia, PEG Restrictions Weight Bearing Restrictions: No       Mobility Bed Mobility Overal bed mobility: Needs Assistance Bed Mobility: Supine to Sit;Sit to Supine     Supine to sit: Total assist;+2 for physical assistance;+2 for safety/equipment Sit to supine: Total assist;+2 for physical assistance;+2 for safety/equipment   General bed mobility comments: totalA+2 for all aspects of bed mobility. Slight movement of L LE but not following commands    Transfers Overall transfer level: Needs assistance               General transfer comment:  Defer to next venue    Balance Overall balance assessment: Needs assistance Sitting-balance support: Feet supported;Single extremity supported Sitting balance-Leahy Scale: Zero Sitting balance - Comments: Max-Total A to maintain sitting EOB. Improving with auditory stimulation of music and patient able to place L hand in lap at times. Postural control: Posterior lean;Right lateral lean                                 ADL either performed or assessed with clinical judgement   ADL Overall ADL's : Needs assistance/impaired     Grooming: Wash/dry face;Total assistance;Sitting Grooming Details (indicate cue type and reason): Total A for washing face. Pt moving therapist's hand away from his face. Pt also pushing hand away when assempting to place washclothe on his hand for him to perform task.                             Functional mobility during ADLs: Total assistance General ADL Comments: Total A for engagement in ADLs and bed mobility. tolerating sitting at EOB with Max A this session and decreased pushing     Vision   Vision Assessment?: Vision impaired- to be further tested in functional context Additional Comments: Eyes open in sitting. Increased eye contact with singing and music. L gaze preference and head turn   Perception     Praxis      Cognition Arousal/Alertness: Lethargic;Awake/alert Behavior During Therapy: Flat affect Overall Cognitive Status: Difficult to assess Area of Impairment: Following commands  Following Commands: Follows one step commands inconsistently       General Comments: Lethargic upon arrival. Opening eyes once sitting up. Pt with decreased eye contact; occasionally making eye contact with therapist but not sustaining. Pt engaging with music and singing - increased eye contact with therapist.        Exercises Exercises: Other exercises Other Exercises Other Exercises: PROM cervical  extension Other Exercises: Lateral propping on R elbow to promote WBing   Shoulder Instructions       General Comments sister present and supportive    Pertinent Vitals/ Pain       Pain Assessment: Faces Faces Pain Scale: No hurt Pain Intervention(s): Monitored during session;Limited activity within patient's tolerance;Repositioned  Home Living                                          Prior Functioning/Environment              Frequency  Min 2X/week        Progress Toward Goals  OT Goals(current goals can now be found in the care plan section)  Progress towards OT goals: Progressing toward goals  Acute Rehab OT Goals Patient Stated Goal: none stated OT Goal Formulation: Patient unable to participate in goal setting Time For Goal Achievement: 02/23/21 Potential to Achieve Goals: Good ADL Goals Pt Will Perform Grooming: with mod assist;sitting Pt Will Transfer to Toilet: with max assist;with +2 assist;squat pivot transfer;bedside commode Pt/caregiver will Perform Home Exercise Program: Right Upper extremity;Increased ROM;Increased strength;With minimal assist Additional ADL Goal #1: Pt will tolerate sitting at EOB for 5 minutes with Mod A in preparation for ADLs Additional ADL Goal #2: Pt will sustain attention to simple ADL with Mod cues  Plan Discharge plan remains appropriate;Frequency remains appropriate    Co-evaluation    PT/OT/SLP Co-Evaluation/Treatment: Yes Reason for Co-Treatment: For patient/therapist safety;To address functional/ADL transfers PT goals addressed during session: Mobility/safety with mobility;Balance OT goals addressed during session: ADL's and self-care      AM-PAC OT "6 Clicks" Daily Activity     Outcome Measure   Help from another person eating meals?: Total Help from another person taking care of personal grooming?: Total Help from another person toileting, which includes using toliet, bedpan, or urinal?:  Total Help from another person bathing (including washing, rinsing, drying)?: Total Help from another person to put on and taking off regular upper body clothing?: Total Help from another person to put on and taking off regular lower body clothing?: Total 6 Click Score: 6    End of Session    OT Visit Diagnosis: Unsteadiness on feet (R26.81);Muscle weakness (generalized) (M62.81);Other symptoms and signs involving cognitive function;Hemiplegia and hemiparesis Hemiplegia - Right/Left: Right Hemiplegia - dominant/non-dominant: Dominant Hemiplegia - caused by: Nontraumatic intracerebral hemorrhage   Activity Tolerance Patient tolerated treatment well   Patient Left in bed;with call bell/phone within reach;with bed alarm set;with family/visitor present;with restraints reapplied   Nurse Communication Mobility status;Precautions        Time: 1287-8676 OT Time Calculation (min): 34 min  Charges: OT General Charges $OT Visit: 1 Visit OT Treatments $Therapeutic Activity: 8-22 mins  [place], OTR/L Acute Rehab Pager: [phone] Office: [place] 02/09/2021, 1:39 PM

## 2021-02-09 NOTE — Plan of Care (Signed)
  Problem: Health Behavior/Discharge Planning: Goal: Ability to manage health-related needs will improve Outcome: Progressing   Problem: Clinical Measurements: Goal: Ability to maintain clinical measurements within normal limits will improve Outcome: Progressing Goal: Will remain free from infection Outcome: Progressing Goal: Diagnostic test results will improve Outcome: Progressing Goal: Respiratory complications will improve Outcome: Progressing Goal: Cardiovascular complication will be avoided Outcome: Progressing   Problem: Nutrition: Goal: Adequate nutrition will be maintained Outcome: Progressing   Problem: Education: Goal: Knowledge of disease or condition will improve Outcome: Progressing Goal: Knowledge of secondary prevention will improve Outcome: Progressing Goal: Knowledge of patient specific risk factors addressed and post discharge goals established will improve Outcome: Progressing   Problem: Intracerebral Hemorrhage Tissue Perfusion: Goal: Complications of Intracerebral Hemorrhage will be minimized Outcome: Progressing   Problem: Education: Goal: Knowledge of disease or condition will improve Outcome: Progressing Goal: Knowledge of secondary prevention will improve Outcome: Progressing Goal: Knowledge of patient specific risk factors addressed and post discharge goals established will improve Outcome: Progressing Goal: Individualized Educational Video(s) Outcome: Progressing   Problem: Health Behavior/Discharge Planning: Goal: Ability to manage health-related needs will improve Outcome: Progressing   Problem: Self-Care: Goal: Ability to participate in self-care as condition permits will improve Outcome: Progressing   Problem: Nutrition: Goal: Risk of aspiration will decrease Outcome: Progressing Goal: Dietary intake will improve Outcome: Progressing   Problem: Intracerebral Hemorrhage Tissue Perfusion: Goal: Complications of Intracerebral  Hemorrhage will be minimized Outcome: Progressing

## 2021-02-09 NOTE — Progress Notes (Signed)
  Speech Language Pathology Treatment: Dysphagia  Patient Details Name: Brian [name] MRN: [id number] DOB: 12/08/1935 Today's Date: 02/09/2021 Time: 3875-6433 SLP Time Calculation (min) (ACUTE ONLY): 16 min  Assessment / Plan / Recommendation Clinical Impression  Followed up for PO trials. Pt s/p PEG 5/25. Pt very alert this date (of note, last SLP encounter lethargy was barrier to PO trials). Pt however combative at SLP with attempts for oral care or PO trials despite cueing. Nursing assisted SLP to provide oral care and assist with attempts at single ice chip. Pt accepted ice chip with max cueing and was able to masticate and manipulate orally. Oral transit was delayed with suspected delay in swallow initiation per palpation. Pt subsequently exhibited immediate and delayed coughing concerning for poor airway protection. No further trials elicited due to pt behavior and difficulty. SLP to continue to follow.    HPI HPI: Pt is a 85 y.o. M who presents with acute onset R sided weakness, facial droop, numbness, aphasia with left gaze preference. CTH showing L thalamic ICH. Significant PMH: afib, DM2, HLD, HTN, stroke. Persistent dysphagia s/p PEG 02/08/21.      SLP Plan  Continue with current plan of care       Recommendations  Diet recommendations: NPO Medication Administration: Via alternative means                Oral Care Recommendations: Oral care QID Follow up Recommendations: Skilled Nursing facility SLP Visit Diagnosis: Dysphagia, oropharyngeal phase (R13.12) Plan: Continue with current plan of care       [place], CCC-SLP Acute Rehabilitation Services   02/09/2021, 9:10 AM

## 2021-02-09 NOTE — Progress Notes (Signed)
PROGRESS NOTE    Brian [name]  ZOX:[id number] DOB: Feb 18, 1936 DOA: 01/23/2021 PCP: [name], MD   Brief Narrative:  HPI on 01/23/2021 by Dr. Donnetta Simpers Rishon Brian [name] is a 85 y.o. male with PMH significant for Afibb on eliquis, DM2, HLD, HTN, obesity, prior L MCA stroke who presents with acute onset R sided weakness + R facial droop + R sided numbness + aphasia with left gaze preference. He is unable to provide any meaningful hx secondary to global aphasia.  Per EMS, was at a car wash, he was talking to the staff there and started having acute onset R sided weakness and language deficit. LKW of [place] on 01/23/21.  Workup with CTH w/o contrast with a L thalamic ICH.  I was unable to get in touch with the listed family members [name] or Brian [name].  LKW: 1445 NIHSS: 21 MRS: Presumed to be 0 given he was walking at the car wash and drove there himself. ICH score: 2(Age > 80 and some Intraventricular extension)  Interim history On admission.  Patient was noted to have left thalamic intracranial hemorrhage with cytotoxic edema with 6 mm left-to-right shift.  Patient was on Eliquis which was reversed with Andexanet alpha.  Patient also required cleviprex infusion for hypertensive emergency through 01/26/2021.  He was admitted to neuro ICU under stroke service.  Patient developed fever and lethargy on 5/12.  He had failed his swallow evaluation and cortrak was placed.  IR consulted status post PEG placement 5/25.  Patient continued to have persistent low-grade fever however leukocytosis was trending downward.  EEG showed focal left-sided slowing but no focal seizures.  Repeat imaging showed an evolving intracranial hemorrhage, neurology felt patient had poor prognosis.  He was transferred out of the ICU and hospitalist assumed care on 5/18.  Long-term plan for disposition to SNF. Assessment & Plan   ICH with cerebral edema -Present on admission -Patient on Eliquis prior to admission.  Was  given reversal agent- Andexanet alpha -Imaging showed left thalamic ICH with 7 mm midline shift on initial imaging -Patient initially admitted by neurology however they signed off on 01/2021 -Repeat imaging showed increased size on 5/15, decreased size on 5/16, and stable on 5/18 images -No further recommendations, patient will need to follow-up with Dr. Leonie Man in 4 to 6 weeks -Continue statin -Echocardiogram showed an EF of 55 to 60% -PT OT recommended SNF placement  Hypertensive emergency -Resolved -Patient initially needed cleviprex infusion -SBP goal less than 160, patient currently at goal -Continue Coreg, losartan, amlodipine.  Continue labetalol and hydralazine as needed  Sustained atrial tachycardia -Resolving -Cardiology consulted and appreciated -Patient had developed 2 episodes of SVT on 5/16 -Continue amiodarone, Coreg  History of atrial fibrillation -As above, patient was on Eliquis however this was reversed on arrival -Anticoagulation held due to [place]  Possible aspiration pneumonia -Resolved -Patient completed 7 days of Unasyn -Continue aspiration precautions  Dysphagia -Secondary to mental status and stroke -Interventional radiology consulted and appreciated, status post percutaneous gastrostomy tube placement on 5/25 -Continue aspiration precaution -Speech therapy following -Started on tube feeds  Diabetes mellitus, type II -Continue Levemir, insulin sliding scale and CBG monitoring  Acute anemia-normocytic/hypochromic -Likely due to critical illness -Monitor H&H, transfuse if below 7   DVT Prophylaxis  SCDs  Code Status: Full  Family Communication: None at bedside  Disposition Plan:  Status is: Inpatient  Remains inpatient appropriate because:Inpatient level of care appropriate due to severity of illness   Dispo: The  patient is from: Home              Anticipated d/c is to: SNF              Patient currently is not medically stable to d/c.    Difficult to place patient No  Consultants Neurology Cardiology Interventional radiology  Procedures  Echocardiogram Carotid ultrasound Gastrostomy tube placement 5/25  Antibiotics   Anti-infectives (From admission, onward)   Start     Dose/Rate Route Frequency Ordered Stop   02/09/21 0600  ceFAZolin (ANCEF) IVPB 2g/100 mL premix  Status:  Discontinued        2 g 200 mL/hr over 30 Minutes Intravenous To Radiology 02/07/21 0924 02/07/21 0935   02/08/21 0850  ceFAZolin (ANCEF) IVPB 2g/100 mL premix        over 30 Minutes Intravenous Continuous PRN 02/08/21 0908 02/08/21 0850   02/08/21 0600  ceFAZolin (ANCEF) IVPB 2g/100 mL premix        2 g 200 mL/hr over 30 Minutes Intravenous To Radiology 02/07/21 0935 02/08/21 0604   01/27/21 2015  Ampicillin-Sulbactam (UNASYN) 3 g in sodium chloride 0.9 % 100 mL IVPB  Status:  Discontinued        3 g 200 mL/hr over 30 Minutes Intravenous Every 8 hours 01/27/21 1917 01/27/21 1918   01/27/21 1945  Ampicillin-Sulbactam (UNASYN) 3 g in sodium chloride 0.9 % 100 mL IVPB        3 g 200 mL/hr over 30 Minutes Intravenous Every 6 hours 01/27/21 1918 02/03/21 1000      Subjective:   Brian [name] seen and examined today.   No acute issues overnight.  Patient nonverbal and noninteractive today.  Objective:   Vitals:   02/09/21 0348 02/09/21 0500 02/09/21 0728 02/09/21 1114  BP: (!) 152/64  (!) 157/60 (!) 127/112  Pulse: 63  65 62  Resp: 18  20 20   Temp: 99.3 F (37.4 C)  99.2 F (37.3 C) 98.7 F (37.1 C)  TempSrc: Oral  Oral Oral  SpO2: 96%  100% 97%  Weight:  102.3 kg    Height:        Intake/Output Summary (Last 24 hours) at 02/09/2021 1406 Last data filed at 02/09/2021 0347 Gross per 24 hour  Intake --  Output 1100 ml  Net -1100 ml   Filed Weights   02/07/21 0446 02/08/21 0500 02/09/21 0500  Weight: 103.3 kg 99.1 kg 102.3 kg    Exam  General: Well developed, elderly, chronically ill-appearing, NAD  HEENT: NCAT, mucous  membranes moist.    Cardiovascular: S1 S2 auscultated, RRR  Respiratory: Diminished breath sounds, no wheezing  Abdomen: Soft, nontender, nondistended, + bowel sounds, peg tube placed  Extremities: warm dry without cyanosis clubbing or edema  Neuro: Awake however cannot assess orientation as patient currently is not interactive today. Non verbal  Psych: cannot assess   Data Reviewed: I have personally reviewed following labs and imaging studies  CBC: Recent Labs  Lab 02/03/21 0500 02/06/21 0516 02/08/21 0448 02/09/21 0029  WBC 8.6 9.0 9.4 10.4  HGB 8.8* 8.4* 9.5* 8.5*  HCT 30.3* 28.5* 32.4* 28.9*  MCV 89.4 88.0 87.8 87.8  PLT 265 296 312 597   Basic Metabolic Panel: Recent Labs  Lab 02/03/21 0500 02/06/21 0516 02/08/21 0448 02/09/21 0029  NA 144 139 139 132*  K 3.9 4.2 4.2 4.5  CL 110 106 103 100  CO2 28 28 27 24   GLUCOSE 159* 110* 108* 181*  BUN 31*  27* 32* 44*  CREATININE 1.14 1.07 0.98 1.10  CALCIUM 8.8* 9.1 9.4 8.9   GFR: Estimated Creatinine Clearance: 63 mL/min (by C-G formula based on SCr of 1.1 mg/dL). Liver Function Tests: No results for input(s): AST, ALT, ALKPHOS, BILITOT, PROT, ALBUMIN in the last 168 hours. No results for input(s): LIPASE, AMYLASE in the last 168 hours. No results for input(s): AMMONIA in the last 168 hours. Coagulation Profile: Recent Labs  Lab 02/08/21 0448  INR 1.1   Cardiac Enzymes: No results for input(s): CKTOTAL, CKMB, CKMBINDEX, TROPONINI in the last 168 hours. BNP (last 3 results) No results for input(s): PROBNP in the last 8760 hours. HbA1C: No results for input(s): HGBA1C in the last 72 hours. CBG: Recent Labs  Lab 02/08/21 2005 02/09/21 0000 02/09/21 0348 02/09/21 0728 02/09/21 1112  GLUCAP 239* 175* 125* 122* 128*   Lipid Profile: No results for input(s): CHOL, HDL, LDLCALC, TRIG, CHOLHDL, LDLDIRECT in the last 72 hours. Thyroid Function Tests: No results for input(s): TSH, T4TOTAL, FREET4, T3FREE,  THYROIDAB in the last 72 hours. Anemia Panel: No results for input(s): VITAMINB12, FOLATE, FERRITIN, TIBC, IRON, RETICCTPCT in the last 72 hours. Urine analysis:    Component Value Date/Time   COLORURINE AMBER (A) 01/26/2021 1054   APPEARANCEUR HAZY (A) 01/26/2021 1054   LABSPEC 1.026 01/26/2021 1054   PHURINE 5.0 01/26/2021 1054   GLUCOSEU 150 (A) 01/26/2021 1054   GLUCOSEU 500 (A) 11/14/2020 1345   HGBUR NEGATIVE 01/26/2021 1054   [place] 01/26/2021 1054   [place] 01/26/2021 1054   PROTEINUR 30 (A) 01/26/2021 1054   UROBILINOGEN 0.2 11/14/2020 1345   NITRITE NEGATIVE 01/26/2021 1054   LEUKOCYTESUR NEGATIVE 01/26/2021 1054   Sepsis Labs: @LABRCNTIP (procalcitonin:4,lacticidven:4)  ) Recent Results (from the past 240 hour(s))  Surgical PCR screen     Status: None   Collection Time: 02/08/21  3:51 AM   Specimen: Nasal Mucosa; Nasal Swab  Result Value Ref Range Status   MRSA, PCR NEGATIVE NEGATIVE Final   Staphylococcus aureus NEGATIVE NEGATIVE Final    Comment: (NOTE) The Xpert SA Assay (FDA approved for NASAL specimens in patients 1 years of age and older), is one component of a comprehensive surveillance program. It is not intended to diagnose infection nor to guide or monitor treatment. Performed at [facility], [place] [address], [place], [place] [address]       Radiology Studies: IR GASTROSTOMY TUBE MOD SED  Result Date: 02/08/2021 INDICATION: History of thalamic hemorrhage now with dysphagia. Please perform percutaneous gastrostomy tube placement for enteric nutrition supplementation purposes. EXAM: PULL TROUGH GASTROSTOMY TUBE PLACEMENT COMPARISON:  Abdominal CT-02/06/2021 MEDICATIONS: Ancef 2 gm IV; Antibiotics were administered within 1 hour of the procedure. Glucagon 1 mg IV CONTRAST:  None - air was utilized as a contrast agent given current contrast shortage an adequate fluoroscopic visualization ANESTHESIA/SEDATION: Moderate  (conscious) sedation was employed during this procedure. A total of Versed 1 mg and Fentanyl 50 mcg was administered intravenously. Moderate Sedation Time: 22 minutes. The patient's level of consciousness and vital signs were monitored continuously by radiology nursing throughout the procedure under my direct supervision. FLUOROSCOPY TIME:  8 minutes, 36 seconds (83 mGy) COMPLICATIONS: None immediate. PROCEDURE: Informed written consent was obtained from the patient's family following explanation of the procedure, risks, benefits and alternatives. A time out was performed prior to the initiation of the procedure. Ultrasound scanning was performed to demarcate the edge of the left lobe of the liver. Maximal barrier sterile technique  utilized including caps, mask, sterile gowns, sterile gloves, large sterile drape, hand hygiene and Betadine prep. The left upper quadrant was sterilely prepped and draped. An oral gastric catheter was inserted into the stomach under fluoroscopy. The existing nasogastric feeding tube was removed. The left costal margin and air opacified transverse colon were identified and avoided. Air was injected into the stomach for insufflation and visualization under fluoroscopy. Under sterile conditions a 17 gauge trocar needle was utilized to access the stomach percutaneously beneath the left subcostal margin after the overlying soft tissues were anesthetized with 1% Lidocaine with epinephrine. Needle position was confirmed within the stomach with aspiration of air. A single T tack was deployed for gastropexy. Over an Amplatz guide wire, a 9-French sheath was inserted into the stomach. A snare device was utilized to capture the oral gastric catheter. The snare device was pulled retrograde from the stomach up the esophagus and out the oropharynx. The 20-French pull-through gastrostomy was connected to the snare device and pulled antegrade through the oropharynx down the esophagus into the stomach  and then through the percutaneous tract external to the patient. The gastrostomy was assembled externally. Air injection confirms appropriate positioning within the stomach. Several spot radiographic images were obtained in various obliquities for documentation. The patient tolerated procedure well without immediate post procedural complication. FINDINGS: After successful fluoroscopic guided placement, the gastrostomy tube is appropriately positioned with internal disc against the ventral aspect of the gastric lumen. IMPRESSION: Successful fluoroscopic insertion of a 20-French pull-through gastrostomy tube. The gastrostomy may be used immediately for medication administration and in 24 hrs for the initiation of feeds. Electronically Signed   By: Sandi Mariscal M.D.   On: 02/08/2021 09:50     Scheduled Meds: .  stroke: mapping our early stages of recovery book   Does not apply Once  . amiodarone  400 mg Per Tube Daily   Followed by  . [START ON 02/28/2021] amiodarone  200 mg Per Tube Daily  . amLODipine  10 mg Per Tube Daily  . carvedilol  25 mg Per Tube BID WC  . chlorhexidine  15 mL Mouth Rinse BID  . Chlorhexidine Gluconate Cloth  6 each Topical Daily  . Chlorhexidine Gluconate Cloth  6 each Topical Q0600  . enoxaparin (LOVENOX) injection  40 mg Subcutaneous Q24H  . feeding supplement (PROSource TF)  45 mL Per Tube TID  . free water  100 mL Per Tube Q4H  . gabapentin  100 mg Per Tube TID  . insulin aspart  3-9 Units Subcutaneous Q4H  . insulin detemir  25 Units Subcutaneous BID  . losartan  50 mg Per Tube BID  . mouth rinse  15 mL Mouth Rinse q12n4p  . nutrition supplement (JUVEN)  1 packet Per Tube BID BM  . pantoprazole sodium  40 mg Per Tube QHS  . senna-docusate  1 tablet Per Tube BID   Continuous Infusions: . feeding supplement (JEVITY 1.5 CAL/FIBER) Stopped (02/07/21 2352)     LOS: 17 days   Time Spent in minutes   30 minutes  Emmitt Matthews D.O. on 02/09/2021 at 2:06  PM  Between 7am to 7pm - Please see pager noted on [email or web address]  After 7pm go to [email or web address]  And look for the night coverage person covering for me after hours  [facility] Office  [phone]

## 2021-02-10 DIAGNOSIS — I471 Supraventricular tachycardia: Secondary | ICD-10-CM | POA: Diagnosis not present

## 2021-02-10 DIAGNOSIS — I610 Nontraumatic intracerebral hemorrhage in hemisphere, subcortical: Secondary | ICD-10-CM | POA: Diagnosis not present

## 2021-02-10 DIAGNOSIS — I169 Hypertensive crisis, unspecified: Secondary | ICD-10-CM | POA: Diagnosis not present

## 2021-02-10 LAB — BASIC METABOLIC PANEL
Anion gap: 8 (ref 5–15)
BUN: 45 mg/dL — ABNORMAL HIGH (ref 8–23)
CO2: 27 mmol/L (ref 22–32)
Calcium: 9.4 mg/dL (ref 8.9–10.3)
Chloride: 101 mmol/L (ref 98–111)
Creatinine, Ser: 1.15 mg/dL (ref 0.61–1.24)
GFR, Estimated: >60 mL/min (ref >60)
Glucose, Bld: 148 mg/dL — ABNORMAL HIGH (ref 70–99)
Potassium: 4.4 mmol/L (ref 3.5–5.1)
Sodium: 136 mmol/L (ref 135–145)

## 2021-02-10 LAB — HEMOGLOBIN AND HEMATOCRIT, BLOOD
HCT: 29.4 % — ABNORMAL LOW (ref 39.0–52.0)
Hemoglobin: 8.8 g/dL — ABNORMAL LOW (ref 13.0–17.0)

## 2021-02-10 LAB — GLUCOSE, CAPILLARY
Glucose-Capillary: 134 mg/dL — ABNORMAL HIGH (ref 70–99)
Glucose-Capillary: 128 mg/dL — ABNORMAL HIGH (ref 70–99)
Glucose-Capillary: 169 mg/dL — ABNORMAL HIGH (ref 70–99)
Glucose-Capillary: 168 mg/dL — ABNORMAL HIGH (ref 70–99)
Glucose-Capillary: 161 mg/dL — ABNORMAL HIGH (ref 70–99)

## 2021-02-10 NOTE — TOC Progression Note (Signed)
Transition of Care [facility]) - Progression Note    Patient Details  Name: Brian [name] MRN: [id number] Date of Birth: 13-Aug-1936  Transition of Care [facility]) CM/SW [place], LCSW Phone Number: 02/10/2021, 5:09 PM  Clinical Narrative:    CSW confirmed with Tressa Busman at [place] that authorization is still pending. He will request Juven tube feeds be ordered but may not arrive until next week so some will need to come with patient. TOC to follow up.    Expected Discharge Plan: [place] Barriers to Discharge: Insurance Authorization,Continued Medical Work up  Expected Discharge Plan and Services Expected Discharge Plan: [place] In-house Referral: Clinical Social Work   Post Acute Care Choice: [place] Living arrangements for the past 2 months: Single Family Home                                       Social Determinants of Health (SDOH) Interventions    Readmission Risk Interventions No flowsheet data found.

## 2021-02-10 NOTE — Progress Notes (Signed)
PROGRESS NOTE    Brian [name]  ZOX:[id number] DOB: [birth year] DOA: 01/23/2021 PCP: [name], MD   Brief Narrative:  HPI on 01/23/2021 by Dr. Donnetta Simpers Amin Brian [name] is a 85 y.o. male with PMH significant for Afibb on eliquis, DM2, HLD, HTN, obesity, prior L MCA stroke who presents with acute onset R sided weakness + R facial droop + R sided numbness + aphasia with left gaze preference. He is unable to provide any meaningful hx secondary to global aphasia.  Per EMS, was at a car wash, he was talking to the staff there and started having acute onset R sided weakness and language deficit. LKW of [name] on 01/23/21.  Workup with CTH w/o contrast with a L thalamic ICH.  I was unable to get in touch with the listed family members [name] or [name].  LKW: 1445 NIHSS: 21 MRS: Presumed to be 0 given he was walking at the car wash and drove there himself. ICH score: 2(Age > 80 and some Intraventricular extension)  Interim history On admission.  Patient was noted to have left thalamic intracranial hemorrhage with cytotoxic edema with 6 mm left-to-right shift.  Patient was on Eliquis which was reversed with Andexanet alpha.  Patient also required cleviprex infusion for hypertensive emergency through 01/26/2021.  He was admitted to neuro ICU under stroke service.  Patient developed fever and lethargy on 5/12.  He had failed his swallow evaluation and cortrak was placed.  IR consulted status post PEG placement 5/25.  Patient continued to have persistent low-grade fever however leukocytosis was trending downward.  EEG showed focal left-sided slowing but no focal seizures.  Repeat imaging showed an evolving intracranial hemorrhage, neurology felt patient had poor prognosis.  He was transferred out of the ICU and hospitalist assumed care on 5/18.  Long-term plan for disposition to SNF. Assessment & Plan   ICH with cerebral edema -Present on admission -Patient on Eliquis prior to admission.  Was  given reversal agent- Andexanet alpha -Imaging showed left thalamic ICH with 7 mm midline shift on initial imaging -Patient initially admitted by neurology however they signed off on 01/2021 -Repeat imaging showed increased size on 5/15, decreased size on 5/16, and stable on 5/18 images -No further recommendations, patient will need to follow-up with Dr. Leonie Man in 4 to 6 weeks -Continue statin -Echocardiogram showed an EF of 55 to 60% -PT OT recommended SNF placement  Hypertensive emergency -Resolved and stable -Patient initially needed cleviprex infusion -SBP goal less than 160, patient currently at goal -Continue Coreg, losartan, amlodipine.  Continue labetalol and hydralazine as needed  Sustained atrial tachycardia -Resolving -Cardiology consulted and appreciated -Patient had developed 2 episodes of SVT on 5/16 -Continue amiodarone, Coreg  History of atrial fibrillation -As above, patient was on Eliquis however this was reversed on arrival -Anticoagulation held due to [place]  Possible aspiration pneumonia -Resolved -Patient completed 7 days of Unasyn -Continue aspiration precautions  Dysphagia -Secondary to mental status and stroke -Interventional radiology consulted and appreciated, status post percutaneous gastrostomy tube placement on 5/25 -Continue aspiration precaution -Speech therapy following -Started on tube feeds  Diabetes mellitus, type II -Continue Levemir, insulin sliding scale and CBG monitoring  Acute anemia-normocytic/hypochromic -Likely due to critical illness-currently 8.8 -Monitor H&H, transfuse if below 7   DVT Prophylaxis  SCDs  Code Status: Full  Family Communication: None at bedside  Disposition Plan:  Status is: Inpatient  Remains inpatient appropriate because:Inpatient level of care appropriate due to severity of illness  Dispo: The patient is from: Home              Anticipated d/c is to: SNF              Patient currently is not  medically stable to d/c.   Difficult to place patient No  Consultants Neurology Cardiology Interventional radiology  Procedures  Echocardiogram Carotid ultrasound Gastrostomy tube placement 5/25  Antibiotics   Anti-infectives (From admission, onward)   Start     Dose/Rate Route Frequency Ordered Stop   02/09/21 0600  ceFAZolin (ANCEF) IVPB 2g/100 mL premix  Status:  Discontinued        2 g 200 mL/hr over 30 Minutes Intravenous To Radiology 02/07/21 0924 02/07/21 0935   02/08/21 0850  ceFAZolin (ANCEF) IVPB 2g/100 mL premix        over 30 Minutes Intravenous Continuous PRN 02/08/21 0908 02/08/21 0850   02/08/21 0600  ceFAZolin (ANCEF) IVPB 2g/100 mL premix        2 g 200 mL/hr over 30 Minutes Intravenous To Radiology 02/07/21 0935 02/08/21 0604   01/27/21 2015  Ampicillin-Sulbactam (UNASYN) 3 g in sodium chloride 0.9 % 100 mL IVPB  Status:  Discontinued        3 g 200 mL/hr over 30 Minutes Intravenous Every 8 hours 01/27/21 1917 01/27/21 1918   01/27/21 1945  Ampicillin-Sulbactam (UNASYN) 3 g in sodium chloride 0.9 % 100 mL IVPB        3 g 200 mL/hr over 30 Minutes Intravenous Every 6 hours 01/27/21 1918 02/03/21 1000      Subjective:   Sharvil Ikard seen and examined today.   No acute issues overnight.  Patient nonverbal and noninteractive today.  Objective:   Vitals:   02/10/21 0042 02/10/21 0437 02/10/21 0806 02/10/21 1135  BP: (!) 149/64 (!) 143/86 (!) 155/69 (!) 141/66  Pulse:  68 68 65  Resp: 20  19 18   Temp: 98.5 F (36.9 C) 99.4 F (37.4 C) 99.3 F (37.4 C) 98.8 F (37.1 C)  TempSrc: Axillary Oral Axillary Axillary  SpO2: 96% 96% 95% 96%  Weight:      Height:        Intake/Output Summary (Last 24 hours) at 02/10/2021 1235 Last data filed at 02/10/2021 0800 Gross per 24 hour  Intake 2750 ml  Output 1400 ml  Net 1350 ml   Filed Weights   02/07/21 0446 02/08/21 0500 02/09/21 0500  Weight: 103.3 kg 99.1 kg 102.3 kg    Exam (no change from  02/09/2021)  General: Well developed, elderly, chronically ill-appearing, NAD  HEENT: NCAT, mucous membranes moist.    Cardiovascular: S1 S2 auscultated, RRR  Respiratory: Diminished breath sounds, no wheezing  Abdomen: Soft, nontender, nondistended, + bowel sounds, peg tube placed  Extremities: warm dry without cyanosis clubbing or edema  Neuro: Awake however cannot assess orientation as patient currently is not interactive today. Non verbal  Psych: cannot assess   Data Reviewed: I have personally reviewed following labs and imaging studies  CBC: Recent Labs  Lab 02/06/21 0516 02/08/21 0448 02/09/21 0029 02/10/21 0300  WBC 9.0 9.4 10.4  --   HGB 8.4* 9.5* 8.5* 8.8*  HCT 28.5* 32.4* 28.9* 29.4*  MCV 88.0 87.8 87.8  --   PLT 296 312 305  --    Basic Metabolic Panel: Recent Labs  Lab 02/06/21 0516 02/08/21 0448 02/09/21 0029 02/10/21 0300  NA 139 139 132* 136  K 4.2 4.2 4.5 4.4  CL 106 103 100  101  CO2 28 27 24 27   GLUCOSE 110* 108* 181* 148*  BUN 27* 32* 44* 45*  CREATININE 1.07 0.98 1.10 1.15  CALCIUM 9.1 9.4 8.9 9.4   GFR: Estimated Creatinine Clearance: 60.2 mL/min (by C-G formula based on SCr of 1.15 mg/dL). Liver Function Tests: No results for input(s): AST, ALT, ALKPHOS, BILITOT, PROT, ALBUMIN in the last 168 hours. No results for input(s): LIPASE, AMYLASE in the last 168 hours. No results for input(s): AMMONIA in the last 168 hours. Coagulation Profile: Recent Labs  Lab 02/08/21 0448  INR 1.1   Cardiac Enzymes: No results for input(s): CKTOTAL, CKMB, CKMBINDEX, TROPONINI in the last 168 hours. BNP (last 3 results) No results for input(s): PROBNP in the last 8760 hours. HbA1C: No results for input(s): HGBA1C in the last 72 hours. CBG: Recent Labs  Lab 02/09/21 2006 02/09/21 2323 02/10/21 0307 02/10/21 0810 02/10/21 1139  GLUCAP 181* 177* 134* 128* 169*   Lipid Profile: No results for input(s): CHOL, HDL, LDLCALC, TRIG, CHOLHDL,  LDLDIRECT in the last 72 hours. Thyroid Function Tests: No results for input(s): TSH, T4TOTAL, FREET4, T3FREE, THYROIDAB in the last 72 hours. Anemia Panel: No results for input(s): VITAMINB12, FOLATE, FERRITIN, TIBC, IRON, RETICCTPCT in the last 72 hours. Urine analysis:    Component Value Date/Time   COLORURINE AMBER (A) 01/26/2021 1054   APPEARANCEUR HAZY (A) 01/26/2021 1054   LABSPEC 1.026 01/26/2021 1054   PHURINE 5.0 01/26/2021 1054   GLUCOSEU 150 (A) 01/26/2021 1054   GLUCOSEU 500 (A) 11/14/2020 1345   HGBUR NEGATIVE 01/26/2021 1054   [place] 01/26/2021 1054   [place] 01/26/2021 1054   PROTEINUR 30 (A) 01/26/2021 1054   UROBILINOGEN 0.2 11/14/2020 1345   NITRITE NEGATIVE 01/26/2021 1054   LEUKOCYTESUR NEGATIVE 01/26/2021 1054   Sepsis Labs: @LABRCNTIP (procalcitonin:4,lacticidven:4)  ) Recent Results (from the past 240 hour(s))  Surgical PCR screen     Status: None   Collection Time: 02/08/21  3:51 AM   Specimen: Nasal Mucosa; Nasal Swab  Result Value Ref Range Status   MRSA, PCR NEGATIVE NEGATIVE Final   Staphylococcus aureus NEGATIVE NEGATIVE Final    Comment: (NOTE) The Xpert SA Assay (FDA approved for NASAL specimens in patients 39 years of age and older), is one component of a comprehensive surveillance program. It is not intended to diagnose infection nor to guide or monitor treatment. Performed at [facility], [place] [address] [place], [place] [address]       Radiology Studies: No results found.   Scheduled Meds: .  stroke: mapping our early stages of recovery book   Does not apply Once  . amiodarone  400 mg Per Tube Daily   Followed by  . [START ON 02/28/2021] amiodarone  200 mg Per Tube Daily  . amLODipine  10 mg Per Tube Daily  . carvedilol  25 mg Per Tube BID WC  . chlorhexidine  15 mL Mouth Rinse BID  . Chlorhexidine Gluconate Cloth  6 each Topical Daily  . Chlorhexidine Gluconate Cloth  6 each Topical Q0600  .  enoxaparin (LOVENOX) injection  40 mg Subcutaneous Q24H  . feeding supplement (PROSource TF)  45 mL Per Tube TID  . free water  100 mL Per Tube Q4H  . gabapentin  100 mg Per Tube TID  . insulin aspart  3-9 Units Subcutaneous Q4H  . insulin detemir  25 Units Subcutaneous BID  . losartan  50 mg Per Tube BID  . mouth rinse  15 mL Mouth Rinse q12n4p  . nutrition supplement (JUVEN)  1 packet Per Tube BID BM  . pantoprazole sodium  40 mg Per Tube QHS  . senna-docusate  1 tablet Per Tube BID   Continuous Infusions: . feeding supplement (JEVITY 1.5 CAL/FIBER) Stopped (02/07/21 2352)     LOS: 18 days   Time Spent in minutes   30 minutes  Zaivion Kundrat D.O. on 02/10/2021 at 12:35 PM  Between 7am to 7pm - Please see pager noted on [email or web address]  After 7pm go to [email or web address]  And look for the night coverage person covering for me after hours  [facility] Office  [phone]

## 2021-02-11 ENCOUNTER — Inpatient Hospital Stay (HOSPITAL_COMMUNITY): Payer: Medicare HMO

## 2021-02-11 DIAGNOSIS — I471 Supraventricular tachycardia: Secondary | ICD-10-CM | POA: Diagnosis not present

## 2021-02-11 DIAGNOSIS — I610 Nontraumatic intracerebral hemorrhage in hemisphere, subcortical: Secondary | ICD-10-CM | POA: Diagnosis not present

## 2021-02-11 DIAGNOSIS — I169 Hypertensive crisis, unspecified: Secondary | ICD-10-CM | POA: Diagnosis not present

## 2021-02-11 LAB — PROCALCITONIN: Procalcitonin: 0.23 ng/mL

## 2021-02-11 LAB — GLUCOSE, CAPILLARY
Glucose-Capillary: 109 mg/dL — ABNORMAL HIGH (ref 70–99)
Glucose-Capillary: 114 mg/dL — ABNORMAL HIGH (ref 70–99)
Glucose-Capillary: 127 mg/dL — ABNORMAL HIGH (ref 70–99)
Glucose-Capillary: 161 mg/dL — ABNORMAL HIGH (ref 70–99)
Glucose-Capillary: 162 mg/dL — ABNORMAL HIGH (ref 70–99)
Glucose-Capillary: 183 mg/dL — ABNORMAL HIGH (ref 70–99)

## 2021-02-11 LAB — CBC
HCT: 29.1 % — ABNORMAL LOW (ref 39.0–52.0)
Hemoglobin: 8.7 g/dL — ABNORMAL LOW (ref 13.0–17.0)
MCH: 26 pg (ref 26.0–34.0)
MCHC: 29.9 g/dL — ABNORMAL LOW (ref 30.0–36.0)
MCV: 87.1 fL (ref 80.0–100.0)
Platelets: 267 10*3/uL (ref 150–400)
RBC: 3.34 MIL/uL — ABNORMAL LOW (ref 4.22–5.81)
RDW: 15.7 % — ABNORMAL HIGH (ref 11.5–15.5)
WBC: 10.5 10*3/uL (ref 4.0–10.5)
nRBC: 2.2 % — ABNORMAL HIGH (ref 0.0–0.2)

## 2021-02-11 LAB — BASIC METABOLIC PANEL
Anion gap: 11 (ref 5–15)
BUN: 36 mg/dL — ABNORMAL HIGH (ref 8–23)
CO2: 26 mmol/L (ref 22–32)
Calcium: 9.1 mg/dL (ref 8.9–10.3)
Chloride: 100 mmol/L (ref 98–111)
Creatinine, Ser: 1.04 mg/dL (ref 0.61–1.24)
GFR, Estimated: >60 mL/min (ref >60)
Glucose, Bld: 124 mg/dL — ABNORMAL HIGH (ref 70–99)
Potassium: 4.7 mmol/L (ref 3.5–5.1)
Sodium: 137 mmol/L (ref 135–145)

## 2021-02-11 LAB — URINALYSIS, ROUTINE W REFLEX MICROSCOPIC
Bilirubin Urine: NEGATIVE
Glucose, UA: NEGATIVE mg/dL
Hgb urine dipstick: NEGATIVE
Ketones, ur: NEGATIVE mg/dL
Leukocytes,Ua: NEGATIVE
Nitrite: NEGATIVE
Protein, ur: NEGATIVE mg/dL
Specific Gravity, Urine: 1.024 (ref 1.005–1.030)
pH: 5 (ref 5.0–8.0)

## 2021-02-11 NOTE — Progress Notes (Addendum)
Cross-coverage note:   Notified that patient developed fever to 38.7 C. Vitals otherwise normal. Has been coughing this morning. CBC is ordered for this morning, will also get blood cultures, procalcitonin, and CXR.

## 2021-02-11 NOTE — Plan of Care (Signed)
  Problem: Health Behavior/Discharge Planning: Goal: Ability to manage health-related needs will improve Outcome: Not Progressing   Problem: Clinical Measurements: Goal: Ability to maintain clinical measurements within normal limits will improve Outcome: Not Progressing Goal: Will remain free from infection Outcome: Not Progressing Goal: Diagnostic test results will improve Outcome: Not Progressing Goal: Respiratory complications will improve Outcome: Not Progressing Goal: Cardiovascular complication will be avoided Outcome: Not Progressing   Problem: Nutrition: Goal: Adequate nutrition will be maintained Outcome: Not Progressing   Problem: Education: Goal: Knowledge of disease or condition will improve Outcome: Not Progressing Goal: Knowledge of secondary prevention will improve Outcome: Not Progressing Goal: Knowledge of patient specific risk factors addressed and post discharge goals established will improve Outcome: Not Progressing   Problem: Intracerebral Hemorrhage Tissue Perfusion: Goal: Complications of Intracerebral Hemorrhage will be minimized Outcome: Not Progressing   Problem: Education: Goal: Knowledge of disease or condition will improve Outcome: Not Progressing Goal: Knowledge of secondary prevention will improve Outcome: Not Progressing Goal: Knowledge of patient specific risk factors addressed and post discharge goals established will improve Outcome: Not Progressing Goal: Individualized Educational Video(s) Outcome: Not Progressing   Problem: Health Behavior/Discharge Planning: Goal: Ability to manage health-related needs will improve Outcome: Not Progressing   Problem: Self-Care: Goal: Ability to participate in self-care as condition permits will improve Outcome: Not Progressing   Problem: Nutrition: Goal: Risk of aspiration will decrease Outcome: Not Progressing Goal: Dietary intake will improve Outcome: Not Progressing   Problem:  Intracerebral Hemorrhage Tissue Perfusion: Goal: Complications of Intracerebral Hemorrhage will be minimized Outcome: Not Progressing

## 2021-02-11 NOTE — TOC Progression Note (Signed)
Transition of Care [facility]) - Progression Note    Patient Details  Name: Brian [name] MRN: [id number] Date of Birth: August 15, 1936  Transition of Care [facility]) CM/SW [place], [place] Phone Number: 02/11/2021, 11:06 AM  Clinical Narrative:    CSW attempted to contact weekend admissions for [facility] to see if they had the requested tube feeds or insurance authorization to DC over the weekend. VM was left for the director, Edwena Blow. CSW will continue to follow for DC planning.   Expected Discharge Plan: [place] Barriers to Discharge: Insurance Authorization,Continued Medical Work up  Expected Discharge Plan and Services Expected Discharge Plan: [place] In-house Referral: Clinical Social Work   Post Acute Care Choice: [place] Living arrangements for the past 2 months: Single Family Home                                       Social Determinants of Health (SDOH) Interventions    Readmission Risk Interventions No flowsheet data found.

## 2021-02-11 NOTE — Progress Notes (Signed)
PROGRESS NOTE    Brian [name]  BDZ:[id number] DOB: 08-02-1936 DOA: 01/23/2021 PCP: [name], MD   Brief Narrative:  HPI on 01/23/2021 by Dr. Donnetta Simpers Brian [name] is a 85 y.o. male with PMH significant for Afibb on eliquis, DM2, HLD, HTN, obesity, prior L MCA stroke who presents with acute onset R sided weakness + R facial droop + R sided numbness + aphasia with left gaze preference. He is unable to provide any meaningful hx secondary to global aphasia.  Per EMS, was at a car wash, he was talking to the staff there and started having acute onset R sided weakness and language deficit. LKW of [place] on 01/23/21.  Workup with CTH w/o contrast with a L thalamic ICH.  I was unable to get in touch with the listed family members [name] or [name].  LKW: 1445 NIHSS: 21 MRS: Presumed to be 0 given he was walking at the car wash and drove there himself. ICH score: 2(Age > 80 and some Intraventricular extension)  Interim history On admission.  Patient was noted to have left thalamic intracranial hemorrhage with cytotoxic edema with 6 mm left-to-right shift.  Patient was on Eliquis which was reversed with Andexanet alpha.  Patient also required cleviprex infusion for hypertensive emergency through 01/26/2021.  He was admitted to neuro ICU under stroke service.  Patient developed fever and lethargy on 5/12.  He had failed his swallow evaluation and cortrak was placed.  IR consulted status post PEG placement 5/25.  Patient continued to have persistent low-grade fever however leukocytosis was trending downward.  EEG showed focal left-sided slowing but no focal seizures.  Repeat imaging showed an evolving intracranial hemorrhage, neurology felt patient had poor prognosis.  He was transferred out of the ICU and hospitalist assumed care on 5/18.  Long-term plan for disposition to SNF. Assessment & Plan   ICH with cerebral edema -Present on admission -Patient on Eliquis prior to admission.  Was  given reversal agent- Andexanet alpha -Imaging showed left thalamic ICH with 7 mm midline shift on initial imaging -Patient initially admitted by neurology however they signed off on 01/2021 -Repeat imaging showed increased size on 5/15, decreased size on 5/16, and stable on 5/18 images -No further recommendations, patient will need to follow-up with Dr. Leonie Man in 4 to 6 weeks -Continue statin -Echocardiogram showed an EF of 55 to 60% -PT OT recommended SNF placement  Fever -Patient developed fever and cough overnight. -Chest x-Ryot unremarkable for infection -Will order UA and urine culture -Blood cultures pending -Procalcitonin 0.23 -Will hold off on starting antibiotics at this time and continue to monitor closely  Hypertensive emergency -Resolved and stable -Patient initially needed cleviprex infusion -SBP goal less than 160, patient currently at goal -Continue Coreg, losartan, amlodipine.  Continue labetalol and hydralazine as needed  Sustained atrial tachycardia -Resolving -Cardiology consulted and appreciated -Patient had developed 2 episodes of SVT on 5/16 -Continue amiodarone, Coreg  History of atrial fibrillation -As above, patient was on Eliquis however this was reversed on arrival -Anticoagulation held due to Mail[place]  Possible aspiration pneumonia -Resolved -Patient completed 7 days of Unasyn -Continue aspiration precautions  Dysphagia -Secondary to mental status and stroke -Interventional radiology consulted and appreciated, status post percutaneous gastrostomy tube placement on 5/25 -Continue aspiration precaution -Speech therapy following -Started on tube feeds  Diabetes mellitus, type II -Continue Levemir, insulin sliding scale and CBG monitoring  Acute anemia-normocytic/hypochromic -Likely due to critical illness-currently 8.7 -Monitor H&H, transfuse if below 7  DVT Prophylaxis  SCDs  Code Status: Full  Family Communication: None at  bedside  Disposition Plan:  Status is: Inpatient  Remains inpatient appropriate because:Inpatient level of care appropriate due to severity of illness   Dispo: The patient is from: Home              Anticipated d/c is to: SNF              Patient currently is not medically stable to d/c.   Difficult to place patient No  Consultants Neurology Cardiology Interventional radiology  Procedures  Echocardiogram Carotid ultrasound Gastrostomy tube placement 5/25  Antibiotics   Anti-infectives (From admission, onward)   Start     Dose/Rate Route Frequency Ordered Stop   02/09/21 0600  ceFAZolin (ANCEF) IVPB 2g/100 mL premix  Status:  Discontinued        2 g 200 mL/hr over 30 Minutes Intravenous To Radiology 02/07/21 0924 02/07/21 0935   02/08/21 0850  ceFAZolin (ANCEF) IVPB 2g/100 mL premix        over 30 Minutes Intravenous Continuous PRN 02/08/21 0908 02/08/21 0850   02/08/21 0600  ceFAZolin (ANCEF) IVPB 2g/100 mL premix        2 g 200 mL/hr over 30 Minutes Intravenous To Radiology 02/07/21 0935 02/08/21 0604   01/27/21 2015  Ampicillin-Sulbactam (UNASYN) 3 g in sodium chloride 0.9 % 100 mL IVPB  Status:  Discontinued        3 g 200 mL/hr over 30 Minutes Intravenous Every 8 hours 01/27/21 1917 01/27/21 1918   01/27/21 1945  Ampicillin-Sulbactam (UNASYN) 3 g in sodium chloride 0.9 % 100 mL IVPB        3 g 200 mL/hr over 30 Minutes Intravenous Every 6 hours 01/27/21 1918 02/03/21 1000      Subjective:   Brian [name] seen and examined today.   Patient currently nonverbal and noninteractive.  Overnight had fever of 101.7 F.  Objective:   Vitals:   02/11/21 0415 02/11/21 0607 02/11/21 0812 02/11/21 1015  BP: (!) 173/81 (!) 157/64 (!) 155/62 (!) 116/54  Pulse: 82 81 76 63  Resp: 19 20 (!) 22 19  Temp: (!) 101.7 F (38.7 C) (!) 100.5 F (38.1 C) (!) 100.5 F (38.1 C) 98.6 F (37 C)  TempSrc: Oral Oral Oral Oral  SpO2: 96%  95% 96%  Weight:      Height:         Intake/Output Summary (Last 24 hours) at 02/11/2021 1356 Last data filed at 02/11/2021 1016 Gross per 24 hour  Intake 2100 ml  Output 2500 ml  Net -400 ml   Filed Weights   02/07/21 0446 02/08/21 0500 02/09/21 0500  Weight: 103.3 kg 99.1 kg 102.3 kg   Exam  General: Well developed, chronically ill-appearing, NAD  HEENT: NCAT, PERRLA, EOMI, Anicteic Sclera, mucous membranes moist.   Cardiovascular: S1 S2 auscultated, RRR.  Respiratory: Diminished breath sounds, with cough  Abdomen: Soft, nontender, nondistended, + bowel sounds, PEG tube  Extremities: warm dry without cyanosis clubbing  Neuro: Awake however cannot assess orientation as patient is nonverbal and not interactive.  Psych: cannot assess   Data Reviewed: I have personally reviewed following labs and imaging studies  CBC: Recent Labs  Lab 02/06/21 0516 02/08/21 0448 02/09/21 0029 02/10/21 0300 02/11/21 0638  WBC 9.0 9.4 10.4  --  10.5  HGB 8.4* 9.5* 8.5* 8.8* 8.7*  HCT 28.5* 32.4* 28.9* 29.4* 29.1*  MCV 88.0 87.8 87.8  --  87.1  PLT 296 312 305  --  591   Basic Metabolic Panel: Recent Labs  Lab 02/06/21 0516 02/08/21 0448 02/09/21 0029 02/10/21 0300 02/11/21 0638  NA 139 139 132* 136 137  K 4.2 4.2 4.5 4.4 4.7  CL 106 103 100 101 100  CO2 28 27 24 27 26   GLUCOSE 110* 108* 181* 148* 124*  BUN 27* 32* 44* 45* 36*  CREATININE 1.07 0.98 1.10 1.15 1.04  CALCIUM 9.1 9.4 8.9 9.4 9.1   GFR: Estimated Creatinine Clearance: 66.6 mL/min (by C-G formula based on SCr of 1.04 mg/dL). Liver Function Tests: No results for input(s): AST, ALT, ALKPHOS, BILITOT, PROT, ALBUMIN in the last 168 hours. No results for input(s): LIPASE, AMYLASE in the last 168 hours. No results for input(s): AMMONIA in the last 168 hours. Coagulation Profile: Recent Labs  Lab 02/08/21 0448  INR 1.1   Cardiac Enzymes: No results for input(s): CKTOTAL, CKMB, CKMBINDEX, TROPONINI in the last 168 hours. BNP (last 3  results) No results for input(s): PROBNP in the last 8760 hours. HbA1C: No results for input(s): HGBA1C in the last 72 hours. CBG: Recent Labs  Lab 02/10/21 2012 02/11/21 0007 02/11/21 0415 02/11/21 0807 02/11/21 1226  GLUCAP 161* 162* 127* 109* 161*   Lipid Profile: No results for input(s): CHOL, HDL, LDLCALC, TRIG, CHOLHDL, LDLDIRECT in the last 72 hours. Thyroid Function Tests: No results for input(s): TSH, T4TOTAL, FREET4, T3FREE, THYROIDAB in the last 72 hours. Anemia Panel: No results for input(s): VITAMINB12, FOLATE, FERRITIN, TIBC, IRON, RETICCTPCT in the last 72 hours. Urine analysis:    Component Value Date/Time   COLORURINE AMBER (A) 01/26/2021 1054   APPEARANCEUR HAZY (A) 01/26/2021 1054   LABSPEC 1.026 01/26/2021 1054   PHURINE 5.0 01/26/2021 1054   GLUCOSEU 150 (A) 01/26/2021 1054   GLUCOSEU 500 (A) 11/14/2020 1345   HGBUR NEGATIVE 01/26/2021 1054   [place] 01/26/2021 1054   [place] 01/26/2021 1054   PROTEINUR 30 (A) 01/26/2021 1054   UROBILINOGEN 0.2 11/14/2020 1345   NITRITE NEGATIVE 01/26/2021 1054   LEUKOCYTESUR NEGATIVE 01/26/2021 1054   Sepsis Labs: @LABRCNTIP (procalcitonin:4,lacticidven:4)  ) Recent Results (from the past 240 hour(s))  Surgical PCR screen     Status: None   Collection Time: 02/08/21  3:51 AM   Specimen: Nasal Mucosa; Nasal Swab  Result Value Ref Range Status   MRSA, PCR NEGATIVE NEGATIVE Final   Staphylococcus aureus NEGATIVE NEGATIVE Final    Comment: (NOTE) The Xpert SA Assay (FDA approved for NASAL specimens in patients 68 years of age and older), is one component of a comprehensive surveillance program. It is not intended to diagnose infection nor to guide or monitor treatment. Performed at [facility], [place] [address] [place], [place] [address]       Radiology Studies: DG CHEST PORT 1 VIEW  Result Date: 02/11/2021 CLINICAL DATA:  Fever and cough. EXAM: PORTABLE CHEST 1 VIEW  COMPARISON:  02/03/2021. FINDINGS: Stable cardiac silhouette.  No mediastinal or hilar masses. Prominent bronchovascular markings most evident at the bases. Subtle opacity at the lateral left lung base is similar to the prior exam, likely due to atelectasis, accentuated by the patient rotation. No convincing pneumonia. No evidence of pulmonary edema. No pleural effusion or pneumothorax. IMPRESSION: 1. No acute cardiopulmonary disease. Electronically Signed   By: Lajean Manes M.D.   On: 02/11/2021 09:35     Scheduled Meds: .  stroke: mapping our early stages of recovery book   Does not  apply Once  . amiodarone  400 mg Per Tube Daily   Followed by  . [START ON 02/28/2021] amiodarone  200 mg Per Tube Daily  . amLODipine  10 mg Per Tube Daily  . carvedilol  25 mg Per Tube BID WC  . chlorhexidine  15 mL Mouth Rinse BID  . Chlorhexidine Gluconate Cloth  6 each Topical Daily  . Chlorhexidine Gluconate Cloth  6 each Topical Q0600  . enoxaparin (LOVENOX) injection  40 mg Subcutaneous Q24H  . feeding supplement (PROSource TF)  45 mL Per Tube TID  . free water  100 mL Per Tube Q4H  . gabapentin  100 mg Per Tube TID  . insulin aspart  3-9 Units Subcutaneous Q4H  . insulin detemir  25 Units Subcutaneous BID  . losartan  50 mg Per Tube BID  . mouth rinse  15 mL Mouth Rinse q12n4p  . nutrition supplement (JUVEN)  1 packet Per Tube BID BM  . pantoprazole sodium  40 mg Per Tube QHS  . senna-docusate  1 tablet Per Tube BID   Continuous Infusions: . feeding supplement (JEVITY 1.5 CAL/FIBER) Stopped (02/07/21 2352)     LOS: 19 days   Time Spent in minutes   30 minutes  Malissia Rabbani D.O. on 02/11/2021 at 1:56 PM  Between 7am to 7pm - Please see pager noted on [email or web address]  After 7pm go to [email or web address]  And look for the night coverage person covering for me after hours  [facility] Office  [phone]

## 2021-02-12 DIAGNOSIS — I471 Supraventricular tachycardia: Secondary | ICD-10-CM | POA: Diagnosis not present

## 2021-02-12 DIAGNOSIS — I169 Hypertensive crisis, unspecified: Secondary | ICD-10-CM | POA: Diagnosis not present

## 2021-02-12 DIAGNOSIS — I610 Nontraumatic intracerebral hemorrhage in hemisphere, subcortical: Secondary | ICD-10-CM | POA: Diagnosis not present

## 2021-02-12 LAB — GLUCOSE, CAPILLARY
Glucose-Capillary: 112 mg/dL — ABNORMAL HIGH (ref 70–99)
Glucose-Capillary: 115 mg/dL — ABNORMAL HIGH (ref 70–99)
Glucose-Capillary: 149 mg/dL — ABNORMAL HIGH (ref 70–99)
Glucose-Capillary: 153 mg/dL — ABNORMAL HIGH (ref 70–99)
Glucose-Capillary: 156 mg/dL — ABNORMAL HIGH (ref 70–99)
Glucose-Capillary: 160 mg/dL — ABNORMAL HIGH (ref 70–99)
Glucose-Capillary: 214 mg/dL — ABNORMAL HIGH (ref 70–99)

## 2021-02-12 LAB — PROCALCITONIN: Procalcitonin: 0.3 ng/mL

## 2021-02-12 MED ORDER — GUAIFENESIN 100 MG/5ML PO SOLN
5.0000 mL | ORAL | Status: DC | PRN
  Administered 2021-02-18 – 2021-02-27 (×5): 100 mg
  Filled 2021-02-12 (×4): qty 5

## 2021-02-12 NOTE — Progress Notes (Signed)
PROGRESS NOTE    Brian [name]  WSF:[id number] DOB: 09-25-1935 DOA: 01/23/2021 PCP: [name], MD   Brief Narrative:  HPI on 01/23/2021 by Dr. Donnetta Simpers Drayce Brian [name] is a 85 y.o. male with PMH significant for Afibb on eliquis, DM2, HLD, HTN, obesity, prior L MCA stroke who presents with acute onset R sided weakness + R facial droop + R sided numbness + aphasia with left gaze preference. He is unable to provide any meaningful hx secondary to global aphasia.  Per EMS, was at a car wash, he was talking to the staff there and started having acute onset R sided weakness and language deficit. LKW of [place] on 01/23/21.  Workup with CTH w/o contrast with a L thalamic ICH.  I was unable to get in touch with the listed family members [name] or Brian [name].  LKW: 1445 NIHSS: 21 MRS: Presumed to be 0 given he was walking at the car wash and drove there himself. ICH score: 2(Age > 80 and some Intraventricular extension)  Interim history On admission.  Patient was noted to have left thalamic intracranial hemorrhage with cytotoxic edema with 6 mm left-to-right shift.  Patient was on Eliquis which was reversed with Andexanet alpha.  Patient also required cleviprex infusion for hypertensive emergency through 01/26/2021.  He was admitted to neuro ICU under stroke service.  Patient developed fever and lethargy on 5/12.  He had failed his swallow evaluation and cortrak was placed.  IR consulted status post PEG placement 5/25.  Patient continued to have persistent low-grade fever however leukocytosis was trending downward.  EEG showed focal left-sided slowing but no focal seizures.  Repeat imaging showed an evolving intracranial hemorrhage, neurology felt patient had poor prognosis.  He was transferred out of the ICU and hospitalist assumed care on 5/18.  Long-term plan for disposition to SNF. Assessment & Plan   ICH with cerebral edema -Present on admission -Patient on Eliquis prior to admission.  Was  given reversal agent- Andexanet alpha -Imaging showed left thalamic ICH with 7 mm midline shift on initial imaging -Patient initially admitted by neurology however they signed off on 01/2021 -Repeat imaging showed increased size on 5/15, decreased size on 5/16, and stable on 5/18 images -No further recommendations, patient will need to follow-up with Dr. Leonie Man in 4 to 6 weeks -Continue statin -Echocardiogram showed an EF of 55 to 60% -PT OT recommended SNF placement  Fever/cough -Patient developed fever and cough overnight on 5/27-28 -Currently afebrile -Chest x-[place] unremarkable for infection -Repeat UA on 02/11/2021 was unremarkable for infection -Blood cultures show no growth to date -Procalcitonin 0.23 -Will continue to hold off on starting antibiotics at this time continue to monitor -Patient cannot participate with incentive spirometry or flutter valve therefore will order chest PT as well as guaifenesin  Hypertensive emergency -Resolved and stable -Patient initially needed cleviprex infusion -SBP goal less than 160, patient currently at goal -Continue Coreg, losartan, amlodipine.  Continue labetalol and hydralazine as needed  Sustained atrial tachycardia -Resolving -Cardiology consulted and appreciated -Patient had developed 2 episodes of SVT on 5/16 -Continue amiodarone, Coreg  History of atrial fibrillation -As above, patient was on Eliquis however this was reversed on arrival -Anticoagulation held due to [place]  Possible aspiration pneumonia -Resolved -Patient completed 7 days of Unasyn -Continue aspiration precautions  Dysphagia -Secondary to mental status and stroke -Interventional radiology consulted and appreciated, status post percutaneous gastrostomy tube placement on 5/25 -Continue aspiration precaution -Speech therapy following -Continue tube feeds  Diabetes mellitus, type II -Continue Levemir, insulin sliding scale and CBG monitoring  Acute  anemia-normocytic/hypochromic -Likely due to critical illness-currently 8.7 -Monitor H&H, transfuse if below 7   DVT Prophylaxis  SCDs  Code Status: Full  Family Communication: None at bedside  Disposition Plan:  Status is: Inpatient  Remains inpatient appropriate because:Inpatient level of care appropriate due to severity of illness   Dispo: The patient is from: Home              Anticipated d/c is to: SNF              Patient currently is not medically stable to d/c.   Difficult to place patient No  Consultants Neurology Cardiology Interventional radiology  Procedures  Echocardiogram Carotid ultrasound Gastrostomy tube placement 5/25  Antibiotics   Anti-infectives (From admission, onward)   Start     Dose/Rate Route Frequency Ordered Stop   02/09/21 0600  ceFAZolin (ANCEF) IVPB 2g/100 mL premix  Status:  Discontinued        2 g 200 mL/hr over 30 Minutes Intravenous To Radiology 02/07/21 0924 02/07/21 0935   02/08/21 0850  ceFAZolin (ANCEF) IVPB 2g/100 mL premix        over 30 Minutes Intravenous Continuous PRN 02/08/21 0908 02/08/21 0850   02/08/21 0600  ceFAZolin (ANCEF) IVPB 2g/100 mL premix        2 g 200 mL/hr over 30 Minutes Intravenous To Radiology 02/07/21 0935 02/08/21 0604   01/27/21 2015  Ampicillin-Sulbactam (UNASYN) 3 g in sodium chloride 0.9 % 100 mL IVPB  Status:  Discontinued        3 g 200 mL/hr over 30 Minutes Intravenous Every 8 hours 01/27/21 1917 01/27/21 1918   01/27/21 1945  Ampicillin-Sulbactam (UNASYN) 3 g in sodium chloride 0.9 % 100 mL IVPB        3 g 200 mL/hr over 30 Minutes Intravenous Every 6 hours 01/27/21 1918 02/03/21 1000      Subjective:   [place]ster Sachdeva seen and examined today.   Patient currently nonverbal and noninteractive.   Objective:   Vitals:   02/11/21 1938 02/11/21 2353 02/12/21 0349 02/12/21 0740  BP: (!) 156/74 (!) 163/69 (!) 159/64 (!) 156/68  Pulse: 68 70 71 72  Resp: 19 19 18 18   Temp: 98.9 F (37.2 C)  98.1 F (36.7 C) 98.1 F (36.7 C) 100 F (37.8 C)  TempSrc: Axillary Axillary Axillary Oral  SpO2: 99% 96% 94% 96%  Weight:      Height:        Intake/Output Summary (Last 24 hours) at 02/12/2021 1030 Last data filed at 02/12/2021 0704 Gross per 24 hour  Intake 720 ml  Output 1550 ml  Net -830 ml   Filed Weights   02/07/21 0446 02/08/21 0500 02/09/21 0500  Weight: 103.3 kg 99.1 kg 102.3 kg   Exam  General: Well developed, chronically-appearing, NAD  HEENT: NCAT, mucous membranes moist.   Cardiovascular: S1 S2 auscultated, RRR.  Respiratory: Diminished breath sounds, with cough  Abdomen: Soft, nontender, nondistended, + bowel sounds, +peg  Extremities: warm dry without cyanosis clubbing. +edema RUE  Neuro: Awake, cannot assess orientation as patient is nonverbal and not interactive  Data Reviewed: I have personally reviewed following labs and imaging studies  CBC: Recent Labs  Lab 02/06/21 0516 02/08/21 0448 02/09/21 0029 02/10/21 0300 02/11/21 0638  WBC 9.0 9.4 10.4  --  10.5  HGB 8.4* 9.5* 8.5* 8.8* 8.7*  HCT 28.5* 32.4* 28.9* 29.4* 29.1*  MCV 88.0 87.8 87.8  --  87.1  PLT 296 312 305  --  482   Basic Metabolic Panel: Recent Labs  Lab 02/06/21 0516 02/08/21 0448 02/09/21 0029 02/10/21 0300 02/11/21 0638  NA 139 139 132* 136 137  K 4.2 4.2 4.5 4.4 4.7  CL 106 103 100 101 100  CO2 28 27 24 27 26   GLUCOSE 110* 108* 181* 148* 124*  BUN 27* 32* 44* 45* 36*  CREATININE 1.07 0.98 1.10 1.15 1.04  CALCIUM 9.1 9.4 8.9 9.4 9.1   GFR: Estimated Creatinine Clearance: 66.6 mL/min (by C-G formula based on SCr of 1.04 mg/dL). Liver Function Tests: No results for input(s): AST, ALT, ALKPHOS, BILITOT, PROT, ALBUMIN in the last 168 hours. No results for input(s): LIPASE, AMYLASE in the last 168 hours. No results for input(s): AMMONIA in the last 168 hours. Coagulation Profile: Recent Labs  Lab 02/08/21 0448  INR 1.1   Cardiac Enzymes: No results for  input(s): CKTOTAL, CKMB, CKMBINDEX, TROPONINI in the last 168 hours. BNP (last 3 results) No results for input(s): PROBNP in the last 8760 hours. HbA1C: No results for input(s): HGBA1C in the last 72 hours. CBG: Recent Labs  Lab 02/11/21 1626 02/11/21 2147 02/12/21 0028 02/12/21 0446 02/12/21 0853  GLUCAP 114* 183* 214* 112* 153*   Lipid Profile: No results for input(s): CHOL, HDL, LDLCALC, TRIG, CHOLHDL, LDLDIRECT in the last 72 hours. Thyroid Function Tests: No results for input(s): TSH, T4TOTAL, FREET4, T3FREE, THYROIDAB in the last 72 hours. Anemia Panel: No results for input(s): VITAMINB12, FOLATE, FERRITIN, TIBC, IRON, RETICCTPCT in the last 72 hours. Urine analysis:    Component Value Date/Time   COLORURINE YELLOW 02/11/2021 0725   APPEARANCEUR CLEAR 02/11/2021 0725   LABSPEC 1.024 02/11/2021 0725   PHURINE 5.0 02/11/2021 0725   GLUCOSEU NEGATIVE 02/11/2021 0725   GLUCOSEU 500 (A) 11/14/2020 1345   HGBUR NEGATIVE 02/11/2021 0725   BILIRUBINUR NEGATIVE 02/11/2021 0725   KETONESUR NEGATIVE 02/11/2021 0725   PROTEINUR NEGATIVE 02/11/2021 0725   UROBILINOGEN 0.2 11/14/2020 1345   NITRITE NEGATIVE 02/11/2021 0725   LEUKOCYTESUR NEGATIVE 02/11/2021 0725   Sepsis Labs: @LABRCNTIP (procalcitonin:4,lacticidven:4)  ) Recent Results (from the past 240 hour(s))  Surgical PCR screen     Status: None   Collection Time: 02/08/21  3:51 AM   Specimen: Nasal Mucosa; Nasal Swab  Result Value Ref Range Status   MRSA, PCR NEGATIVE NEGATIVE Final   Staphylococcus aureus NEGATIVE NEGATIVE Final    Comment: (NOTE) The Xpert SA Assay (FDA approved for NASAL specimens in patients 56 years of age and older), is one component of a comprehensive surveillance program. It is not intended to diagnose infection nor to guide or monitor treatment. Performed at [facility], [place] [address] [place], [place] [address]   Culture, blood (routine x 2)     Status: None (Preliminary  result)   Collection Time: 02/11/21  6:38 AM   Specimen: BLOOD  Result Value Ref Range Status   Specimen Description   Final    BLOOD LEFT ANTECUBITAL Performed at [facility], [address] [place], [place] [address]    Special Requests   Final    BOTTLES DRAWN AEROBIC AND ANAEROBIC Blood Culture adequate volume Performed at [facility], [address], [place], [place] [address]    Culture   Final    NO GROWTH 1 DAY Performed at [facility], [place] [address] [place], [place]  [address]    Report Status PENDING  Incomplete  Culture, blood (routine x 2)     Status: None (Preliminary result)   Collection Time: 02/11/21  6:41 AM   Specimen: BLOOD LEFT ARM  Result Value Ref Range Status   Specimen Description   Final    BLOOD LEFT ARM Performed at [facility], [address] [place], [place] [address]    Special Requests   Final    BOTTLES DRAWN AEROBIC AND ANAEROBIC Blood Culture adequate volume Performed at [facility], [address] [place], [place] [address]    Culture   Final    NO GROWTH 1 DAY Performed at [facility], [place] [address], [place], [place] [address]    Report Status PENDING  Incomplete      Radiology Studies: DG CHEST PORT 1 VIEW  Result Date: 02/11/2021 CLINICAL DATA:  Fever and cough. EXAM: PORTABLE CHEST 1 VIEW COMPARISON:  02/03/2021. FINDINGS: Stable cardiac silhouette.  No mediastinal or hilar masses. Prominent bronchovascular markings most evident at the bases. Subtle opacity at the lateral left lung base is similar to the prior exam, likely due to atelectasis, accentuated by the patient rotation. No convincing pneumonia. No evidence of pulmonary edema. No pleural effusion or pneumothorax. IMPRESSION: 1. No acute cardiopulmonary disease. Electronically Signed   By: Lajean Manes M.D.   On: 02/11/2021 09:35     Scheduled Meds: .  stroke: mapping our early stages of recovery book   Does  not apply Once  . amiodarone  400 mg Per Tube Daily   Followed by  . [START ON 02/28/2021] amiodarone  200 mg Per Tube Daily  . amLODipine  10 mg Per Tube Daily  . carvedilol  25 mg Per Tube BID WC  . chlorhexidine  15 mL Mouth Rinse BID  . Chlorhexidine Gluconate Cloth  6 each Topical Daily  . Chlorhexidine Gluconate Cloth  6 each Topical Q0600  . enoxaparin (LOVENOX) injection  40 mg Subcutaneous Q24H  . feeding supplement (PROSource TF)  45 mL Per Tube TID  . free water  100 mL Per Tube Q4H  . gabapentin  100 mg Per Tube TID  . insulin aspart  3-9 Units Subcutaneous Q4H  . insulin detemir  25 Units Subcutaneous BID  . losartan  50 mg Per Tube BID  . mouth rinse  15 mL Mouth Rinse q12n4p  . nutrition supplement (JUVEN)  1 packet Per Tube BID BM  . pantoprazole sodium  40 mg Per Tube QHS  . senna-docusate  1 tablet Per Tube BID   Continuous Infusions: . feeding supplement (JEVITY 1.5 CAL/FIBER) 1,000 mL (02/11/21 1704)     LOS: 20 days   Time Spent in minutes   30 minutes  Alain Deschene D.O. on 02/12/2021 at 10:30 AM  Between 7am to 7pm - Please see pager noted on [email or web address]  After 7pm go to [email or web address]  And look for the night coverage person covering for me after hours  [facility] Office  [phone]

## 2021-02-13 ENCOUNTER — Inpatient Hospital Stay (HOSPITAL_COMMUNITY): Payer: Medicare HMO

## 2021-02-13 DIAGNOSIS — I169 Hypertensive crisis, unspecified: Secondary | ICD-10-CM | POA: Diagnosis not present

## 2021-02-13 DIAGNOSIS — I610 Nontraumatic intracerebral hemorrhage in hemisphere, subcortical: Secondary | ICD-10-CM | POA: Diagnosis not present

## 2021-02-13 DIAGNOSIS — I471 Supraventricular tachycardia: Secondary | ICD-10-CM | POA: Diagnosis not present

## 2021-02-13 LAB — GLUCOSE, CAPILLARY
Glucose-Capillary: 136 mg/dL — ABNORMAL HIGH (ref 70–99)
Glucose-Capillary: 126 mg/dL — ABNORMAL HIGH (ref 70–99)
Glucose-Capillary: 111 mg/dL — ABNORMAL HIGH (ref 70–99)
Glucose-Capillary: 149 mg/dL — ABNORMAL HIGH (ref 70–99)
Glucose-Capillary: 155 mg/dL — ABNORMAL HIGH (ref 70–99)
Glucose-Capillary: 137 mg/dL — ABNORMAL HIGH (ref 70–99)

## 2021-02-13 LAB — CBC
HCT: 29.2 % — ABNORMAL LOW (ref 39.0–52.0)
Hemoglobin: 8.7 g/dL — ABNORMAL LOW (ref 13.0–17.0)
MCH: 25.9 pg — ABNORMAL LOW (ref 26.0–34.0)
MCHC: 29.8 g/dL — ABNORMAL LOW (ref 30.0–36.0)
MCV: 86.9 fL (ref 80.0–100.0)
Platelets: 287 10*3/uL (ref 150–400)
RBC: 3.36 MIL/uL — ABNORMAL LOW (ref 4.22–5.81)
RDW: 15.9 % — ABNORMAL HIGH (ref 11.5–15.5)
WBC: 7.9 10*3/uL (ref 4.0–10.5)
nRBC: 3.5 % — ABNORMAL HIGH (ref 0.0–0.2)

## 2021-02-13 LAB — PROCALCITONIN: Procalcitonin: 0.29 ng/mL

## 2021-02-13 MED ORDER — DIATRIZOATE MEGLUMINE & SODIUM 66-10 % PO SOLN
50.0000 mL | Freq: Once | ORAL | Status: AC
  Administered 2021-02-13: 20 mL via ORAL
  Filled 2021-02-13: qty 60

## 2021-02-13 MED ORDER — SODIUM CHLORIDE 3 % IN NEBU
4.0000 mL | INHALATION_SOLUTION | Freq: Every day | RESPIRATORY_TRACT | Status: AC
  Administered 2021-02-14 – 2021-02-15 (×2): 4 mL via RESPIRATORY_TRACT
  Filled 2021-02-13 (×3): qty 4

## 2021-02-13 MED ORDER — ALBUTEROL SULFATE (2.5 MG/3ML) 0.083% IN NEBU: 2.5000 mg | INHALATION_SOLUTION | RESPIRATORY_TRACT | Status: DC | PRN

## 2021-02-13 NOTE — Progress Notes (Signed)
PROGRESS NOTE    Brian [name]  DXI:[id number] DOB: 1936-02-28 DOA: 01/23/2021 PCP: [name], MD   Brief Narrative:  HPI on 01/23/2021 by Dr. Donnetta Simpers Brian [name] is a 85 y.o. male with PMH significant for Afibb on eliquis, DM2, HLD, HTN, obesity, prior L MCA stroke who presents with acute onset R sided weakness + R facial droop + R sided numbness + aphasia with left gaze preference. He is unable to provide any meaningful hx secondary to global aphasia.  Per EMS, was at a car wash, he was talking to the staff there and started having acute onset R sided weakness and language deficit. LKW of [place] on 01/23/21.  Workup with CTH w/o contrast with a L thalamic ICH.  I was unable to get in touch with the listed family members [name] or [name].  LKW: 1445 NIHSS: 21 MRS: Presumed to be 0 given he was walking at the car wash and drove there himself. ICH score: 2(Age > 80 and some Intraventricular extension)  Interim history On admission.  Patient was noted to have left thalamic intracranial hemorrhage with cytotoxic edema with 6 mm left-to-right shift.  Patient was on Eliquis which was reversed with Andexanet alpha.  Patient also required cleviprex infusion for hypertensive emergency through 01/26/2021.  He was admitted to neuro ICU under stroke service.  Patient developed fever and lethargy on 5/12.  He had failed his swallow evaluation and cortrak was placed.  IR consulted status post PEG placement 5/25.  Patient continued to have persistent low-grade fever however leukocytosis was trending downward.  EEG showed focal left-sided slowing but no focal seizures.  Repeat imaging showed an evolving intracranial hemorrhage, neurology felt patient had poor prognosis.  He was transferred out of the ICU and hospitalist assumed care on 5/18.  Long-term plan for disposition to SNF.  Today, patient noted to have some drainage from PEG site.  Will ask IR to reevaluate. Assessment & Plan   ICH  with cerebral edema -Present on admission -Patient on Eliquis prior to admission.  Was given reversal agent- Andexanet alpha -Imaging showed left thalamic ICH with 7 mm midline shift on initial imaging -Patient initially admitted by neurology however they signed off on 01/2021 -Repeat imaging showed increased size on 5/15, decreased size on 5/16, and stable on 5/18 images -No further recommendations, patient will need to follow-up with Dr. Leonie Man in 4 to 6 weeks -Continue statin -Echocardiogram showed an EF of 55 to 60% -PT OT recommended SNF placement  Fever/cough -Patient developed fever and cough overnight on 5/27-28 -Currently afebrile -Chest x-Olsen unremarkable for infection -Repeat UA on 02/11/2021 was unremarkable for infection -Blood cultures show no growth to date -Procalcitonin 0.23 -Will continue to hold off on starting antibiotics at this time continue to monitor -Patient cannot participate with incentive spirometry or flutter valve.  Had ordered chest PT however per respiratory therapy, patient was not allowing for this. -Will order hypertonic nebs as well as albuterol  Hypertensive emergency -Resolved and stable -Patient initially needed cleviprex infusion -SBP goal less than 160, patient currently at goal -Continue Coreg, losartan, amlodipine.  Continue labetalol and hydralazine as needed  Sustained atrial tachycardia -Resolving -Cardiology consulted and appreciated -Patient had developed 2 episodes of SVT on 5/16 -Continue amiodarone, Coreg  History of atrial fibrillation -As above, patient was on Eliquis however this was reversed on arrival -Anticoagulation held due to Icard  Possible aspiration pneumonia -Resolved -Patient completed 7 days of Unasyn -Continue aspiration precautions  Dysphagia -Secondary to mental status and stroke -Interventional radiology consulted and appreciated, status post percutaneous gastrostomy tube placement on 5/25 -Continue  aspiration precaution -Speech therapy following -Continue tube feeds -Patient noted to have some type of purulent drainage around PEG tube site -Will order x-Maksym for confirmation of placement as well as ask IR to reevaluate  Diabetes mellitus, type II -Continue Levemir, insulin sliding scale and CBG monitoring  Acute anemia-normocytic/hypochromic -Likely due to critical illness-currently, hemoglobin currently 8.7 -Monitor H&H, transfuse if below 7   DVT Prophylaxis  SCDs  Code Status: Full  Family Communication: None at bedside  Disposition Plan:  Status is: Inpatient  Remains inpatient appropriate because:Inpatient level of care appropriate due to severity of illness   Dispo: The patient is from: Home              Anticipated d/c is to: SNF              Patient currently is not medically stable to d/c.   Difficult to place patient No  Consultants Neurology Cardiology Interventional radiology  Procedures  Echocardiogram Carotid ultrasound Gastrostomy tube placement 5/25  Antibiotics   Anti-infectives (From admission, onward)   Start     Dose/Rate Route Frequency Ordered Stop   02/09/21 0600  ceFAZolin (ANCEF) IVPB 2g/100 mL premix  Status:  Discontinued        2 g 200 mL/hr over 30 Minutes Intravenous To Radiology 02/07/21 0924 02/07/21 0935   02/08/21 0850  ceFAZolin (ANCEF) IVPB 2g/100 mL premix        over 30 Minutes Intravenous Continuous PRN 02/08/21 0908 02/08/21 0850   02/08/21 0600  ceFAZolin (ANCEF) IVPB 2g/100 mL premix        2 g 200 mL/hr over 30 Minutes Intravenous To Radiology 02/07/21 0935 02/08/21 0604   01/27/21 2015  Ampicillin-Sulbactam (UNASYN) 3 g in sodium chloride 0.9 % 100 mL IVPB  Status:  Discontinued        3 g 200 mL/hr over 30 Minutes Intravenous Every 8 hours 01/27/21 1917 01/27/21 1918   01/27/21 1945  Ampicillin-Sulbactam (UNASYN) 3 g in sodium chloride 0.9 % 100 mL IVPB        3 g 200 mL/hr over 30 Minutes Intravenous Every 6  hours 01/27/21 1918 02/03/21 1000      Subjective:   Pace Schumpert seen and examined today.   Patient currently nonverbal and noninteractive.   Objective:   Vitals:   02/12/21 2327 02/13/21 0331 02/13/21 0742 02/13/21 1113  BP: (!) 133/58 (!) 128/55 (!) 142/64 (!) 100/50  Pulse: 67 62 72 (!) 58  Resp: 18 19 18 18   Temp: 99.2 F (37.3 C) 99.3 F (37.4 C) 98 F (36.7 C) 99.1 F (37.3 C)  TempSrc: Oral Oral  Oral  SpO2: 98% 93% 97% 92%  Weight:  104.9 kg    Height:        Intake/Output Summary (Last 24 hours) at 02/13/2021 1411 Last data filed at 02/13/2021 0354 Gross per 24 hour  Intake --  Output 1150 ml  Net -1150 ml   Filed Weights   02/08/21 0500 02/09/21 0500 02/13/21 0331  Weight: 99.1 kg 102.3 kg 104.9 kg   Exam  General: Well developed, chronically ill-appearing, NAD  HEENT: NCAT, mucous membranes moist.   Cardiovascular: S1 S2 auscultated, RRR  Respiratory: Diminished breath sounds  Abdomen: Soft, TTP around PEG tube, nondistended, + bowel sounds, PEG tube in place, small blisters noted on abdomen  Extremities: warm dry  without cyanosis clubbing.   Skin: Blister noted on posterior thigh  Neuro: Awake, however cannot assess orientation at this time as patient is not interactive and nonverbal  Data Reviewed: I have personally reviewed following labs and imaging studies  CBC: Recent Labs  Lab 02/08/21 0448 02/09/21 0029 02/10/21 0300 02/11/21 0638 02/13/21 0357  WBC 9.4 10.4  --  10.5 7.9  HGB 9.5* 8.5* 8.8* 8.7* 8.7*  HCT 32.4* 28.9* 29.4* 29.1* 29.2*  MCV 87.8 87.8  --  87.1 86.9  PLT 312 305  --  267 578   Basic Metabolic Panel: Recent Labs  Lab 02/08/21 0448 02/09/21 0029 02/10/21 0300 02/11/21 0638  NA 139 132* 136 137  K 4.2 4.5 4.4 4.7  CL 103 100 101 100  CO2 27 24 27 26   GLUCOSE 108* 181* 148* 124*  BUN 32* 44* 45* 36*  CREATININE 0.98 1.10 1.15 1.04  CALCIUM 9.4 8.9 9.4 9.1   GFR: Estimated Creatinine Clearance: 67.4  mL/min (by C-G formula based on SCr of 1.04 mg/dL). Liver Function Tests: No results for input(s): AST, ALT, ALKPHOS, BILITOT, PROT, ALBUMIN in the last 168 hours. No results for input(s): LIPASE, AMYLASE in the last 168 hours. No results for input(s): AMMONIA in the last 168 hours. Coagulation Profile: Recent Labs  Lab 02/08/21 0448  INR 1.1   Cardiac Enzymes: No results for input(s): CKTOTAL, CKMB, CKMBINDEX, TROPONINI in the last 168 hours. BNP (last 3 results) No results for input(s): PROBNP in the last 8760 hours. HbA1C: No results for input(s): HGBA1C in the last 72 hours. CBG: Recent Labs  Lab 02/12/21 2035 02/12/21 2328 02/13/21 0332 02/13/21 0844 02/13/21 1141  GLUCAP 156* 149* 136* 126* 111*   Lipid Profile: No results for input(s): CHOL, HDL, LDLCALC, TRIG, CHOLHDL, LDLDIRECT in the last 72 hours. Thyroid Function Tests: No results for input(s): TSH, T4TOTAL, FREET4, T3FREE, THYROIDAB in the last 72 hours. Anemia Panel: No results for input(s): VITAMINB12, FOLATE, FERRITIN, TIBC, IRON, RETICCTPCT in the last 72 hours. Urine analysis:    Component Value Date/Time   COLORURINE YELLOW 02/11/2021 0725   APPEARANCEUR CLEAR 02/11/2021 0725   LABSPEC 1.024 02/11/2021 0725   PHURINE 5.0 02/11/2021 0725   GLUCOSEU NEGATIVE 02/11/2021 0725   GLUCOSEU 500 (A) 11/14/2020 1345   HGBUR NEGATIVE 02/11/2021 0725   BILIRUBINUR NEGATIVE 02/11/2021 0725   KETONESUR NEGATIVE 02/11/2021 0725   PROTEINUR NEGATIVE 02/11/2021 0725   UROBILINOGEN 0.2 11/14/2020 1345   NITRITE NEGATIVE 02/11/2021 0725   LEUKOCYTESUR NEGATIVE 02/11/2021 0725   Sepsis Labs: @LABRCNTIP (procalcitonin:4,lacticidven:4)  ) Recent Results (from the past 240 hour(s))  Surgical PCR screen     Status: None   Collection Time: 02/08/21  3:51 AM   Specimen: Nasal Mucosa; Nasal Swab  Result Value Ref Range Status   MRSA, PCR NEGATIVE NEGATIVE Final   Staphylococcus aureus NEGATIVE NEGATIVE Final     Comment: (NOTE) The Xpert SA Assay (FDA approved for NASAL specimens in patients 34 years of age and older), is one component of a comprehensive surveillance program. It is not intended to diagnose infection nor to guide or monitor treatment. Performed at [facility], [place] [address] [place], [place] [address]   Culture, blood (routine x 2)     Status: None (Preliminary result)   Collection Time: 02/11/21  6:38 AM   Specimen: BLOOD  Result Value Ref Range Status   Specimen Description   Final    BLOOD LEFT ANTECUBITAL Performed at [facility]  [address] [place] [place], [place] [address]    Special Requests   Final    BOTTLES DRAWN AEROBIC AND ANAEROBIC Blood Culture adequate volume Performed at [facility], [address] [place], [place] [address]    Culture   Final    NO GROWTH 2 DAYS Performed at [facility], [place] [address] [place], [place] [address]    Report Status PENDING  Incomplete  Culture, blood (routine x 2)     Status: None (Preliminary result)   Collection Time: 02/11/21  6:41 AM   Specimen: BLOOD LEFT ARM  Result Value Ref Range Status   Specimen Description   Final    BLOOD LEFT ARM Performed at [facility], [address] [place], [place] [address]    Special Requests   Final    BOTTLES DRAWN AEROBIC AND ANAEROBIC Blood Culture adequate volume Performed at [facility], [address] [place], [place] [address]    Culture   Final    NO GROWTH 2 DAYS Performed at [facility], [place] [address], [place], [place] [address]    Report Status PENDING  Incomplete      Radiology Studies: No results found.   Scheduled Meds: .  stroke: mapping our early stages of recovery book   Does not apply Once  . amiodarone  400 mg Per Tube Daily   Followed by  . [START ON 02/28/2021] amiodarone  200 mg Per Tube Daily  . amLODipine  10 mg Per Tube Daily  . carvedilol  25 mg Per Tube BID WC  .  chlorhexidine  15 mL Mouth Rinse BID  . Chlorhexidine Gluconate Cloth  6 each Topical Daily  . Chlorhexidine Gluconate Cloth  6 each Topical Q0600  . enoxaparin (LOVENOX) injection  40 mg Subcutaneous Q24H  . feeding supplement (PROSource TF)  45 mL Per Tube TID  . free water  100 mL Per Tube Q4H  . gabapentin  100 mg Per Tube TID  . insulin aspart  3-9 Units Subcutaneous Q4H  . insulin detemir  25 Units Subcutaneous BID  . losartan  50 mg Per Tube BID  . mouth rinse  15 mL Mouth Rinse q12n4p  . nutrition supplement (JUVEN)  1 packet Per Tube BID BM  . pantoprazole sodium  40 mg Per Tube QHS  . senna-docusate  1 tablet Per Tube BID  . sodium chloride HYPERTONIC  4 mL Nebulization Daily   Continuous Infusions: . feeding supplement (JEVITY 1.5 CAL/FIBER) 1,000 mL (02/13/21 0732)     LOS: 21 days   Time Spent in minutes   30 minutes  Dymphna Wadley D.O. on 02/13/2021 at 2:11 PM  Between 7am to 7pm - Please see pager noted on [email or web address]  After 7pm go to [email or web address]  And look for the night coverage person covering for me after hours  [facility] Office  [phone]

## 2021-02-13 NOTE — Progress Notes (Signed)
Physical Therapy Treatment Patient Details Name: Brian [name] MRN: [id number] DOB: 07-08-1936 Today's Date: 02/13/2021    History of Present Illness 85 y.o. M who presents with acute onset R sided weakness, facial droop, numbness, aphasia with left gaze preference. CTH showing L thalamic ICH.  S/P peg tube 5/25.Significant PMH: afib, DM2, HLD, HTN, stroke.    PT Comments    Patient continues to be limited by aphasia and impaired cognition. Not following commands and no attempts at verbalizations this session. Patient continues to require totalA+2 for bed mobility and max-totalA sitting EOB. Worked on L lateral lean on elbow with decreased pushing noted. PROM cervical extension and rotation. Continue to recommend SNF for ongoing Physical Therapy.       Follow Up Recommendations  SNF     Equipment Recommendations  Other (comment) (defer)    Recommendations for Other Services       Precautions / Restrictions Precautions Precautions: Fall;Other (comment) Precaution Comments: R hemiparesis, global aphasia, PEG Restrictions Weight Bearing Restrictions: No    Mobility  Bed Mobility Overal bed mobility: Needs Assistance Bed Mobility: Supine to Sit;Sit to Supine;Rolling Rolling: Total assist;+2 for physical assistance   Supine to sit: Total assist;+2 for physical assistance;+2 for safety/equipment Sit to supine: Total assist;+2 for physical assistance;+2 for safety/equipment   General bed mobility comments: totalA+2 for all aspects of bed mobility. Slight movement of L LE but not following commands    Transfers                 General transfer comment: deferred  Ambulation/Gait                 Stairs             Wheelchair Mobility    Modified Rankin (Stroke Patients Only) Modified Rankin (Stroke Patients Only) Pre-Morbid Rankin Score: Slight disability Modified Rankin: Severe disability     Balance Overall balance assessment: Needs  assistance Sitting-balance support: Feet supported;Single extremity supported Sitting balance-Leahy Scale: Zero Sitting balance - Comments: max-total assist for sitting balance, pushing with L UE at times but improved after lateral leaning L L elbow. Total assist for trunk and cervical control to reduce forward head positoin. Postural control: Posterior lean;Right lateral lean                                  Cognition Arousal/Alertness: Lethargic;Awake/alert Behavior During Therapy: Flat affect Overall Cognitive Status: Difficult to assess                                 General Comments: lethargic upon entry, opening eyes more consistently in sitting but fatigues easily and closes.  Occasionally making eye contact but no sustaining.  L gaze preference. No attempts at verbalization.      Exercises Other Exercises Other Exercises: PROM cervical extension and rotation to R Other Exercises: Lateral propping on R elbow to promote WBing, propping on L elbow to reduce pushing Other Exercises: PROM of R UE from shoulder to hand x 5 reps, donned resting hand splint, placed UE on pillows; remains with increased tone.    General Comments        Pertinent Vitals/Pain Pain Assessment: Faces Faces Pain Scale: No hurt Pain Intervention(s): Monitored during session    Home Living  Prior Function            PT Goals (current goals can now be found in the care plan section) Acute Rehab PT Goals Patient Stated Goal: none stated PT Goal Formulation: Patient unable to participate in goal setting Time For Goal Achievement: 02/22/21 Potential to Achieve Goals: Fair Progress towards PT goals: Not progressing toward goals - comment    Frequency    Min 3X/week      PT Plan Current plan remains appropriate    Co-evaluation PT/OT/SLP Co-Evaluation/Treatment: Yes Reason for Co-Treatment: Complexity of the patient's impairments  (multi-system involvement);Necessary to address cognition/behavior during functional activity;For patient/therapist safety;To address functional/ADL transfers PT goals addressed during session: Mobility/safety with mobility;Balance OT goals addressed during session: ADL's and self-care      AM-PAC PT "6 Clicks" Mobility   Outcome Measure  Help needed turning from your back to your side while in a flat bed without using bedrails?: Total Help needed moving from lying on your back to sitting on the side of a flat bed without using bedrails?: Total Help needed moving to and from a bed to a chair (including a wheelchair)?: Total Help needed standing up from a chair using your arms (e.g., wheelchair or bedside chair)?: Total Help needed to walk in hospital room?: Total Help needed climbing 3-5 steps with a railing? : Total 6 Click Score: 6    End of Session   Activity Tolerance: Patient tolerated treatment well Patient left: in bed;with call bell/phone within reach;with bed alarm set Nurse Communication: Mobility status PT Visit Diagnosis: Other abnormalities of gait and mobility (R26.89);Hemiplegia and hemiparesis Hemiplegia - Right/Left: Right Hemiplegia - dominant/non-dominant: Dominant Hemiplegia - caused by: Nontraumatic intracerebral hemorrhage     Time: 1011-1036 PT Time Calculation (min) (ACUTE ONLY): 25 min  Charges:  $Therapeutic Activity: 8-22 mins                     Jonathandavid Marlett A. Gilford Rile PT, DPT Acute Rehabilitation Services Pager [phone] Office [phone]    Linna Hoff 02/13/2021, 12:21 PM

## 2021-02-13 NOTE — Progress Notes (Signed)
Occupational Therapy Treatment Patient Details Name: Brian [name] MRN: [id number] DOB: Sep 07, 1936 Today's Date: 02/13/2021    History of present illness 85 y.o. M who presents with acute onset R sided weakness, facial droop, numbness, aphasia with left gaze preference. CTH showing L thalamic ICH.  S/P peg tube 5/25.Significant PMH: afib, DM2, HLD, HTN, stroke.   OT comments  Patient supine in bed, continues to require +2 max-total assist for all bed mobility.  Sitting EOB with max-total assist, decreased pushing with Lateral leans into L elbow.  Worked on cervical extension and ROM due to L gaze preference and increased tone, lateral leans into R elbow for weightbearing and PROM of R UE.  Pt with no attempts to vocalization, not following commands.  Will follow acutely.    Follow Up Recommendations  SNF;Supervision/Assistance - 24 hour    Equipment Recommendations  Wheelchair (measurements OT);Wheelchair cushion (measurements OT);Hospital bed    Recommendations for Other Services      Precautions / Restrictions Precautions Precautions: Fall;Other (comment) Precaution Comments: R hemiparesis, global aphasia, PEG Restrictions Weight Bearing Restrictions: No       Mobility Bed Mobility Overal bed mobility: Needs Assistance Bed Mobility: Supine to Sit;Sit to Supine Rolling: Total assist;+2 for physical assistance   Supine to sit: Total assist;+2 for physical assistance;+2 for safety/equipment Sit to supine: Total assist;+2 for physical assistance;+2 for safety/equipment   General bed mobility comments: totalA+2 for all aspects of bed mobility. Slight movement of L LE but not following commands    Transfers                 General transfer comment: deferred    Balance Overall balance assessment: Needs assistance Sitting-balance support: Feet supported;Single extremity supported Sitting balance-Leahy Scale: Zero Sitting balance - Comments: max-total assist for  sitting balance, pushing with L UE at times but improved after lateral leaning L L elbow. Total assist for trunk and cervical control to reduce forward head positoin. Postural control: Posterior lean;Right lateral lean                                 ADL either performed or assessed with clinical judgement   ADL Overall ADL's : Needs assistance/impaired     Grooming: Wash/dry face;Total assistance;Sitting                               Functional mobility during ADLs: Total assistance;+2 for physical assistance;+2 for safety/equipment General ADL Comments: total assist for ADL engagement.  Max assist at times EOB but pushing with L UE total assist.  Improved with L elbow leaning.\     Vision   Additional Comments: eyes open in sitting but fatigues easily, L gaze preference and head turn   Perception     Praxis      Cognition Arousal/Alertness: Lethargic;Awake/alert Behavior During Therapy: Flat affect Overall Cognitive Status: Difficult to assess                                 General Comments: lethargic upon entry, opening eyes more consistently in sitting but fatigues easily and closes.  Occasionally making eye contact but no sustaining.  L gaze preference. No attempts at verbalization.        Exercises Exercises: Other exercises Other Exercises Other Exercises: PROM cervical extension and rotation  to R Other Exercises: Lateral propping on R elbow to promote WBing, propping on L elbow to reduce pushing Other Exercises: PROM of R UE from shoulder to hand x 5 reps, donned resting hand splint, placed UE on pillows; remains with increased tone.   Shoulder Instructions       General Comments      Pertinent Vitals/ Pain       Pain Assessment: Faces Faces Pain Scale: No hurt  Home Living                                          Prior Functioning/Environment              Frequency  Min 2X/week         Progress Toward Goals  OT Goals(current goals can now be found in the care plan section)  Progress towards OT goals: Progressing toward goals  Acute Rehab OT Goals Patient Stated Goal: none stated OT Goal Formulation: Patient unable to participate in goal setting  Plan Discharge plan remains appropriate;Frequency remains appropriate    Co-evaluation    PT/OT/SLP Co-Evaluation/Treatment: Yes Reason for Co-Treatment: For patient/therapist safety;Necessary to address cognition/behavior during functional activity;To address functional/ADL transfers;Complexity of the patient's impairments (multi-system involvement)   OT goals addressed during session: ADL's and self-care      AM-PAC OT "6 Clicks" Daily Activity     Outcome Measure   Help from another person eating meals?: Total Help from another person taking care of personal grooming?: Total Help from another person toileting, which includes using toliet, bedpan, or urinal?: Total Help from another person bathing (including washing, rinsing, drying)?: Total Help from another person to put on and taking off regular upper body clothing?: Total Help from another person to put on and taking off regular lower body clothing?: Total 6 Click Score: 6    End of Session    OT Visit Diagnosis: Unsteadiness on feet (R26.81);Muscle weakness (generalized) (M62.81);Other symptoms and signs involving cognitive function;Hemiplegia and hemiparesis Hemiplegia - Right/Left: Right Hemiplegia - dominant/non-dominant: Dominant Hemiplegia - caused by: Nontraumatic intracerebral hemorrhage   Activity Tolerance Patient tolerated treatment well   Patient Left in bed;with call bell/phone within reach;with bed alarm set;with SCD's reapplied   Nurse Communication Mobility status;Precautions        Time: 3299-2426 OT Time Calculation (min): 25 min  Charges: OT General Charges $OT Visit: 1 Visit OT Treatments $Self Care/Home Management : 8-22  mins  Jolaine Artist, OT Acute Rehabilitation Services Pager [phone] Office [phone]    Brian [name] 02/13/2021, 11:08 AM

## 2021-02-13 NOTE — Plan of Care (Signed)
  Problem: Health Behavior/Discharge Planning: Goal: Ability to manage health-related needs will improve Outcome: Not Progressing   Problem: Clinical Measurements: Goal: Ability to maintain clinical measurements within normal limits will improve Outcome: Not Progressing Goal: Will remain free from infection Outcome: Not Progressing Goal: Diagnostic test results will improve Outcome: Not Progressing Goal: Respiratory complications will improve Outcome: Not Progressing Goal: Cardiovascular complication will be avoided Outcome: Not Progressing   Problem: Nutrition: Goal: Adequate nutrition will be maintained Outcome: Not Progressing   Problem: Education: Goal: Knowledge of disease or condition will improve Outcome: Not Progressing Goal: Knowledge of secondary prevention will improve Outcome: Not Progressing Goal: Knowledge of patient specific risk factors addressed and post discharge goals established will improve Outcome: Not Progressing   Problem: Intracerebral Hemorrhage Tissue Perfusion: Goal: Complications of Intracerebral Hemorrhage will be minimized Outcome: Not Progressing   Problem: Education: Goal: Knowledge of disease or condition will improve Outcome: Not Progressing Goal: Knowledge of secondary prevention will improve Outcome: Not Progressing Goal: Knowledge of patient specific risk factors addressed and post discharge goals established will improve Outcome: Not Progressing Goal: Individualized Educational Video(s) Outcome: Not Progressing   Problem: Health Behavior/Discharge Planning: Goal: Ability to manage health-related needs will improve Outcome: Not Progressing   Problem: Self-Care: Goal: Ability to participate in self-care as condition permits will improve Outcome: Not Progressing   Problem: Nutrition: Goal: Risk of aspiration will decrease Outcome: Not Progressing Goal: Dietary intake will improve Outcome: Not Progressing   Problem:  Intracerebral Hemorrhage Tissue Perfusion: Goal: Complications of Intracerebral Hemorrhage will be minimized Outcome: Not Progressing

## 2021-02-13 NOTE — Progress Notes (Signed)
SLP Cancellation Note  Patient Details Name: NAMON VILLARIN MRN: [id number] DOB: [birth year]   Cancelled treatment:       Reason Eval/Treat Not Completed: Fatigue/lethargy limiting ability to participate   Elvina Sidle, M.S., [place] 02/13/2021, 11:53 AM

## 2021-02-14 DIAGNOSIS — I471 Supraventricular tachycardia: Secondary | ICD-10-CM | POA: Diagnosis not present

## 2021-02-14 DIAGNOSIS — I610 Nontraumatic intracerebral hemorrhage in hemisphere, subcortical: Secondary | ICD-10-CM | POA: Diagnosis not present

## 2021-02-14 DIAGNOSIS — I169 Hypertensive crisis, unspecified: Secondary | ICD-10-CM | POA: Diagnosis not present

## 2021-02-14 LAB — CBC
HCT: 29.3 % — ABNORMAL LOW (ref 39.0–52.0)
Hemoglobin: 8.7 g/dL — ABNORMAL LOW (ref 13.0–17.0)
MCH: 25.5 pg — ABNORMAL LOW (ref 26.0–34.0)
MCHC: 29.7 g/dL — ABNORMAL LOW (ref 30.0–36.0)
MCV: 85.9 fL (ref 80.0–100.0)
Platelets: 301 10*3/uL (ref 150–400)
RBC: 3.41 MIL/uL — ABNORMAL LOW (ref 4.22–5.81)
RDW: 15.9 % — ABNORMAL HIGH (ref 11.5–15.5)
WBC: 6.5 10*3/uL (ref 4.0–10.5)
nRBC: 4.3 % — ABNORMAL HIGH (ref 0.0–0.2)

## 2021-02-14 LAB — BASIC METABOLIC PANEL
Anion gap: 8 (ref 5–15)
BUN: 46 mg/dL — ABNORMAL HIGH (ref 8–23)
CO2: 28 mmol/L (ref 22–32)
Calcium: 9.3 mg/dL (ref 8.9–10.3)
Chloride: 100 mmol/L (ref 98–111)
Creatinine, Ser: 1.09 mg/dL (ref 0.61–1.24)
GFR, Estimated: >60 mL/min (ref >60)
Glucose, Bld: 167 mg/dL — ABNORMAL HIGH (ref 70–99)
Potassium: 4.5 mmol/L (ref 3.5–5.1)
Sodium: 136 mmol/L (ref 135–145)

## 2021-02-14 LAB — GLUCOSE, CAPILLARY
Glucose-Capillary: 145 mg/dL — ABNORMAL HIGH (ref 70–99)
Glucose-Capillary: 149 mg/dL — ABNORMAL HIGH (ref 70–99)
Glucose-Capillary: 156 mg/dL — ABNORMAL HIGH (ref 70–99)
Glucose-Capillary: 168 mg/dL — ABNORMAL HIGH (ref 70–99)
Glucose-Capillary: 131 mg/dL — ABNORMAL HIGH (ref 70–99)
Glucose-Capillary: 150 mg/dL — ABNORMAL HIGH (ref 70–99)

## 2021-02-14 MED ORDER — GLUCERNA 1.5 CAL PO LIQD
1000.0000 mL | ORAL | Status: DC
  Administered 2021-02-14 – 2021-02-28 (×16): 1000 mL
  Filled 2021-02-14 (×25): qty 1000

## 2021-02-14 NOTE — Progress Notes (Signed)
  Speech Language Pathology Treatment: Dysphagia  Patient Details Name: Brian [name] MRN: [id number] DOB: [birth year] Today's Date: 02/14/2021 Time: 8088-1103 SLP Time Calculation (min) (ACUTE ONLY): 8 min  Assessment / Plan / Recommendation Clinical Impression  Pt very lethargic this am and unable to obtain adequate alertness for PO intake despite max verbal and tactile stimulation cues. With damp washcloth to face and ice chips/drops of thin liquids to lips, pt exhibited some combativeness with slight eye opening, however again, alertness remained poor. Ultimately no POs consumed this date due to mentation. Above discussed with RN. SLP to f/u for continued trials to assess PO readiness.   HPI HPI: Pt is a 84 y.o. M who presents with acute onset R sided weakness, facial droop, numbness, aphasia with left gaze preference. CTH showing L thalamic ICH. Significant PMH: afib, DM2, HLD, HTN, stroke. Persistent dysphagia s/p PEG 02/08/21.      SLP Plan  Continue with current plan of care       Recommendations  Diet recommendations: NPO Medication Administration: Via alternative means                Oral Care Recommendations: Oral care QID Follow up Recommendations: Skilled Nursing facility SLP Visit Diagnosis: Dysphagia, oropharyngeal phase (R13.12) Plan: Continue with current plan of care       [place], [place], [place] Office Number: [phone]  [name] 02/14/2021, 10:41 AM

## 2021-02-14 NOTE — TOC Progression Note (Signed)
Transition of Care [facility]) - Progression Note    Patient Details  Name: Brian [name] MRN: [id number] Date of Birth: Jan 08, 1936  Transition of Care [facility]) CM/SW [place], [place] Phone Number: 02/14/2021, 3:59 PM  Clinical Narrative:   CSW reached out to [place] in Admissions at [place], and [employer] request is still pending at this time. [employer] asked for updated clinicals, so CSW sent over the hub and [place] sent them to [place] for review. Auth request still pending at this time.     Expected Discharge Plan: [place] Barriers to Discharge: Insurance Authorization,Continued Medical Work up  Expected Discharge Plan and Services Expected Discharge Plan: [place] In-house Referral: Clinical Social Work   Post Acute Care Choice: [place] Living arrangements for the past 2 months: Single Family Home                                       Social Determinants of Health (SDOH) Interventions    Readmission Risk Interventions No flowsheet data found.

## 2021-02-14 NOTE — Progress Notes (Signed)
IR was requested to check the G tube site due to small amount of thick drainage coming out of the G tube site.    Patient was assessed at the bedside, site is clean, dry, no erythema or drainage noted.  Dressing was clean, dry, intact.   Notified RN and ordering MD above finding.    Will delete the IR rad eval order.  Please call IR for questions and concerns.    Armando Gang Sanjeev Main PA-C 02/14/2021 4:11 PM

## 2021-02-14 NOTE — Progress Notes (Signed)
PROGRESS NOTE    [name]  TOI:[id number] DOB: December 20, 1935 DOA: 01/23/2021 PCP: [name], MD   Brief Narrative:  HPI on 01/23/2021 by Dr. Donnetta Simpers [place] Brian [name] is a 85 y.o. male with PMH significant for Afibb on eliquis, DM2, HLD, HTN, obesity, prior L MCA stroke who presents with acute onset R sided weakness + R facial droop + R sided numbness + aphasia with left gaze preference. He is unable to provide any meaningful hx secondary to global aphasia.  Per EMS, was at a car wash, he was talking to the staff there and started having acute onset R sided weakness and language deficit. LKW of [place] on 01/23/21.  Workup with CTH w/o contrast with a L thalamic ICH.  I was unable to get in touch with the listed family members [name] or [name].  LKW: 1445 NIHSS: 21 MRS: Presumed to be 0 given he was walking at the car wash and drove there himself. ICH score: 2(Age > 80 and some Intraventricular extension)  Interim history On admission.  Patient was noted to have left thalamic intracranial hemorrhage with cytotoxic edema with 6 mm left-to-right shift.  Patient was on Eliquis which was reversed with Andexanet alpha.  Patient also required cleviprex infusion for hypertensive emergency through 01/26/2021.  He was admitted to neuro ICU under stroke service.  Patient developed fever and lethargy on 5/12.  He had failed his swallow evaluation and cortrak was placed.  IR consulted status post PEG placement 5/25.  Patient continued to have persistent low-grade fever however leukocytosis was trending downward.  EEG showed focal left-sided slowing but no focal seizures.  Repeat imaging showed an evolving intracranial hemorrhage, neurology felt patient had poor prognosis.  He was transferred out of the ICU and hospitalist assumed care on 5/18.  Long-term plan for disposition to SNF.  On 5/30, patient noted to have some drainage from PEG site.  Placed IR consult for re-evaluation of PEG  site. Assessment & Plan   ICH with cerebral edema -Present on admission -Patient on Eliquis prior to admission.  Was given reversal agent- Andexanet alpha -Imaging showed left thalamic ICH with 7 mm midline shift on initial imaging -Patient initially admitted by neurology however they signed off on 01/2021 -Repeat imaging showed increased size on 5/15, decreased size on 5/16, and stable on 5/18 images -No further recommendations, patient will need to follow-up with Dr. Leonie Man in 4 to 6 weeks -Continue statin -Echocardiogram showed an EF of 55 to 60% -PT OT recommended SNF placement  Fever/cough -Patient developed fever and cough overnight on 5/27-28 -Currently afebrile -Chest x-[place] unremarkable for infection -Repeat UA on 02/11/2021 was unremarkable for infection -Blood cultures show no growth to date -Procalcitonin 0.23 -Will continue to hold off on starting antibiotics at this time continue to monitor -Patient cannot participate with incentive spirometry or flutter valve.  Had ordered chest PT however per respiratory therapy, patient was not allowing for this. -Continue hypertonic nebs as well as albuterol  Hypertensive emergency -Resolved and stable -Patient initially needed cleviprex infusion -SBP goal less than 160, patient currently at goal -Continue Coreg, losartan, amlodipine.  Continue labetalol and hydralazine as needed  Sustained atrial tachycardia -Resolving -Cardiology consulted and appreciated -Patient had developed 2 episodes of SVT on 5/16 -Continue amiodarone, Coreg  History of atrial fibrillation -As above, patient was on Eliquis however this was reversed on arrival -Anticoagulation held due to [place]  Possible aspiration pneumonia -Resolved -Patient completed 7 days of Unasyn -  Continue aspiration precautions  Dysphagia -Secondary to mental status and stroke -Interventional radiology consulted and appreciated, status post percutaneous gastrostomy tube  placement on 5/25 -Continue aspiration precaution -Speech therapy following -Continue tube feeds -Patient noted to have some type of purulent drainage around PEG tube site -Xray ordered for peg tube placement- peg tube was in satisfactory position, no extraluminal contrast to suggest extravasation.   Diabetes mellitus, type II -Continue Levemir, insulin sliding scale and CBG monitoring  Acute anemia-normocytic/hypochromic -Likely due to critical illness-currently, hemoglobin currently 8.7 -Monitor H&H, transfuse if below 7   DVT Prophylaxis  SCDs  Code Status: Full  Family Communication: None at bedside  Disposition Plan:  Status is: Inpatient  Remains inpatient appropriate because:Inpatient level of care appropriate due to severity of illness   Dispo: The patient is from: Home              Anticipated d/c is to: SNF              Patient currently is not medically stable to d/c.   Difficult to place patient No  Consultants Neurology Cardiology Interventional radiology  Procedures  Echocardiogram Carotid ultrasound Gastrostomy tube placement 5/25  Antibiotics   Anti-infectives (From admission, onward)   Start     Dose/Rate Route Frequency Ordered Stop   02/09/21 0600  ceFAZolin (ANCEF) IVPB 2g/100 mL premix  Status:  Discontinued        2 g 200 mL/hr over 30 Minutes Intravenous To Radiology 02/07/21 0924 02/07/21 0935   02/08/21 0850  ceFAZolin (ANCEF) IVPB 2g/100 mL premix        over 30 Minutes Intravenous Continuous PRN 02/08/21 0908 02/08/21 0850   02/08/21 0600  ceFAZolin (ANCEF) IVPB 2g/100 mL premix        2 g 200 mL/hr over 30 Minutes Intravenous To Radiology 02/07/21 0935 02/08/21 0604   01/27/21 2015  Ampicillin-Sulbactam (UNASYN) 3 g in sodium chloride 0.9 % 100 mL IVPB  Status:  Discontinued        3 g 200 mL/hr over 30 Minutes Intravenous Every 8 hours 01/27/21 1917 01/27/21 1918   01/27/21 1945  Ampicillin-Sulbactam (UNASYN) 3 g in sodium chloride  0.9 % 100 mL IVPB        3 g 200 mL/hr over 30 Minutes Intravenous Every 6 hours 01/27/21 1918 02/03/21 1000      Subjective:   [name] seen and examined today.   Patient currently nonverbal and noninteractive.   Objective:   Vitals:   02/14/21 0417 02/14/21 0815 02/14/21 0831 02/14/21 1129  BP:   (!) 152/68 (!) 133/58  Pulse:   75 69  Resp:   (!) 24 (!) 22  Temp:   98.8 F (37.1 C) 99.3 F (37.4 C)  TempSrc:   Oral Oral  SpO2:  96% 97% 93%  Weight: 102.6 kg     Height:        Intake/Output Summary (Last 24 hours) at 02/14/2021 1317 Last data filed at 02/14/2021 0831 Gross per 24 hour  Intake --  Output 1450 ml  Net -1450 ml   Filed Weights   02/13/21 0331 02/14/21 0321 02/14/21 0417  Weight: 104.9 kg 105.1 kg 102.6 kg   Exam  General: Well developed, chronically ill-appearing, NAD  HEENT: NCAT, mucous membranes moist.   Cardiovascular: S1 S2 auscultated, RRR  Respiratory: Diminished breath sounds  Abdomen: Soft, TTP around PEG tube, nondistended, + bowel sounds, PEG tube in place, small blisters noted on abdomen  Extremities:  warm dry without cyanosis clubbing.   Skin: Blister noted on posterior Right thigh   Data Reviewed: I have personally reviewed following labs and imaging studies  CBC: Recent Labs  Lab 02/08/21 0448 02/09/21 0029 02/10/21 0300 02/11/21 0638 02/13/21 0357 02/14/21 0247  WBC 9.4 10.4  --  10.5 7.9 6.5  HGB 9.5* 8.5* 8.8* 8.7* 8.7* 8.7*  HCT 32.4* 28.9* 29.4* 29.1* 29.2* 29.3*  MCV 87.8 87.8  --  87.1 86.9 85.9  PLT 312 305  --  267 287 845   Basic Metabolic Panel: Recent Labs  Lab 02/08/21 0448 02/09/21 0029 02/10/21 0300 02/11/21 0638 02/14/21 0247  NA 139 132* 136 137 136  K 4.2 4.5 4.4 4.7 4.5  CL 103 100 101 100 100  CO2 27 24 27 26 28   GLUCOSE 108* 181* 148* 124* 167*  BUN 32* 44* 45* 36* 46*  CREATININE 0.98 1.10 1.15 1.04 1.09  CALCIUM 9.4 8.9 9.4 9.1 9.3   GFR: Estimated Creatinine Clearance: 63.6  mL/min (by C-G formula based on SCr of 1.09 mg/dL). Liver Function Tests: No results for input(s): AST, ALT, ALKPHOS, BILITOT, PROT, ALBUMIN in the last 168 hours. No results for input(s): LIPASE, AMYLASE in the last 168 hours. No results for input(s): AMMONIA in the last 168 hours. Coagulation Profile: Recent Labs  Lab 02/08/21 0448  INR 1.1   Cardiac Enzymes: No results for input(s): CKTOTAL, CKMB, CKMBINDEX, TROPONINI in the last 168 hours. BNP (last 3 results) No results for input(s): PROBNP in the last 8760 hours. HbA1C: No results for input(s): HGBA1C in the last 72 hours. CBG: Recent Labs  Lab 02/13/21 2314 02/14/21 0320 02/14/21 0816 02/14/21 1112 02/14/21 1126  GLUCAP 137* 145* 149* 168* 156*   Lipid Profile: No results for input(s): CHOL, HDL, LDLCALC, TRIG, CHOLHDL, LDLDIRECT in the last 72 hours. Thyroid Function Tests: No results for input(s): TSH, T4TOTAL, FREET4, T3FREE, THYROIDAB in the last 72 hours. Anemia Panel: No results for input(s): VITAMINB12, FOLATE, FERRITIN, TIBC, IRON, RETICCTPCT in the last 72 hours. Urine analysis:    Component Value Date/Time   COLORURINE YELLOW 02/11/2021 0725   APPEARANCEUR CLEAR 02/11/2021 0725   LABSPEC 1.024 02/11/2021 0725   PHURINE 5.0 02/11/2021 0725   GLUCOSEU NEGATIVE 02/11/2021 0725   GLUCOSEU 500 (A) 11/14/2020 1345   HGBUR NEGATIVE 02/11/2021 0725   BILIRUBINUR NEGATIVE 02/11/2021 0725   KETONESUR NEGATIVE 02/11/2021 0725   PROTEINUR NEGATIVE 02/11/2021 0725   UROBILINOGEN 0.2 11/14/2020 1345   NITRITE NEGATIVE 02/11/2021 0725   LEUKOCYTESUR NEGATIVE 02/11/2021 0725   Sepsis Labs: @LABRCNTIP (procalcitonin:4,lacticidven:4)  ) Recent Results (from the past 240 hour(s))  Surgical PCR screen     Status: None   Collection Time: 02/08/21  3:51 AM   Specimen: Nasal Mucosa; Nasal Swab  Result Value Ref Range Status   MRSA, PCR NEGATIVE NEGATIVE Final   Staphylococcus aureus NEGATIVE NEGATIVE Final     Comment: (NOTE) The Xpert SA Assay (FDA approved for NASAL specimens in patients 62 years of age and older), is one component of a comprehensive surveillance program. It is not intended to diagnose infection nor to guide or monitor treatment. Performed at [facility], [place] [address], [place], [place] [address]   Culture, blood (routine x 2)     Status: None (Preliminary result)   Collection Time: 02/11/21  6:38 AM   Specimen: BLOOD  Result Value Ref Range Status   Specimen Description   Final    BLOOD LEFT ANTECUBITAL  Performed at [facility], [address] [place], [place] [address]    Special Requests   Final    BOTTLES DRAWN AEROBIC AND ANAEROBIC Blood Culture adequate volume Performed at [facility], [address] [place], [place] [address]    Culture   Final    NO GROWTH 3 DAYS Performed at [facility], [place] [address] [place], [place] [address]    Report Status PENDING  Incomplete  Culture, blood (routine x 2)     Status: None (Preliminary result)   Collection Time: 02/11/21  6:41 AM   Specimen: BLOOD LEFT ARM  Result Value Ref Range Status   Specimen Description   Final    BLOOD LEFT ARM Performed at [facility], [address] [place], [place] [address]    Special Requests   Final    BOTTLES DRAWN AEROBIC AND ANAEROBIC Blood Culture adequate volume Performed at [facility], [address] [place], [place] [address]    Culture   Final    NO GROWTH 3 DAYS Performed at [facility], [place] [address] [place], [place] [address]    Report Status PENDING  Incomplete      Radiology Studies: DG ABDOMEN PEG TUBE LOCATION  Result Date: 02/13/2021 CLINICAL DATA:  Peg tube placement EXAM: ABDOMEN - 1 VIEW COMPARISON:  None. FINDINGS: Contrast is hand injected through the PEG tube opacifying the stomach. Peg tube is in satisfactory position. No extraluminal contrast to suggest extravasation. No bowel  dilatation to suggest obstruction. No evidence of pneumoperitoneum, portal venous gas or pneumatosis. No pathologic calcifications along the expected course of the ureters. No acute osseous abnormality. IMPRESSION: Peg tube is in satisfactory position. No extraluminal contrast to suggest extravasation. Electronically Signed   By: Kathreen Devoid   On: 02/13/2021 15:22     Scheduled Meds: .  stroke: mapping our early stages of recovery book   Does not apply Once  . amiodarone  400 mg Per Tube Daily   Followed by  . [START ON 02/28/2021] amiodarone  200 mg Per Tube Daily  . amLODipine  10 mg Per Tube Daily  . carvedilol  25 mg Per Tube BID WC  . chlorhexidine  15 mL Mouth Rinse BID  . Chlorhexidine Gluconate Cloth  6 each Topical Daily  . Chlorhexidine Gluconate Cloth  6 each Topical Q0600  . enoxaparin (LOVENOX) injection  40 mg Subcutaneous Q24H  . feeding supplement (PROSource TF)  45 mL Per Tube TID  . free water  100 mL Per Tube Q4H  . gabapentin  100 mg Per Tube TID  . insulin aspart  3-9 Units Subcutaneous Q4H  . insulin detemir  25 Units Subcutaneous BID  . losartan  50 mg Per Tube BID  . mouth rinse  15 mL Mouth Rinse q12n4p  . nutrition supplement (JUVEN)  1 packet Per Tube BID BM  . pantoprazole sodium  40 mg Per Tube QHS  . senna-docusate  1 tablet Per Tube BID  . sodium chloride HYPERTONIC  4 mL Nebulization Daily   Continuous Infusions: . feeding supplement (JEVITY 1.5 CAL/FIBER) 1,000 mL (02/14/21 0400)     LOS: 22 days   Time Spent in minutes   30 minutes  Mirl Hillery D.O. on 02/14/2021 at 1:17 PM  Between 7am to 7pm - Please see pager noted on [email or web address]  After 7pm go to [email or web address]  And look for the  night coverage person covering for me after hours  [facility] Office  [phone]

## 2021-02-14 NOTE — Progress Notes (Addendum)
Nutrition Follow-up  DOCUMENTATION CODES:  Not applicable  INTERVENTION:  Continue tube feeding via PEG: Glucerna 1.5 at 65 ml/h (1560 ml per day) 122m free water Q4H (per MD)  Provides 2340 kcal, 128 gm protein, 1184 ml free water daily (17935mtotal free water with flushes)   -1 packet Juven BID, each packet provides 95 calories, 2.5 grams of protein (collagen), and 9.8 grams of carbohydrate (3 grams sugar); also contains 7 grams of L-arginine and L-glutamine, 300 mg vitamin C, 15 mg vitamin E, 1.2 mcg vitamin B-12, 9.5 mg zinc, 200 mg calcium, and 1.5 g  Calcium Beta-hydroxy-Beta-methylbutyrate to support wound healing  NUTRITION DIAGNOSIS:  Inadequate oral intake related to inability to eat as evidenced by NPO status. Ongoing.   GOAL:  Patient will meet greater than or equal to 90% of their needs Met.   MONITOR:  TF tolerance,Diet advancement  REASON FOR ASSESSMENT:  Consult Enteral/tube feeding initiation and management  ASSESSMENT:  Pt with PMH of Afib, DM, HLD, HTN, obesity, prior L MCA now admitted with ICH and associated cerebral edema in setting of hypertensive emergency and Apixaban therapy for Afib.   5/11 cortak placed, tip gastric 5/13 worsening lethargy started on 3% 5/14 developed Afib, followed by cardiology 5/18 tx to hospitalist service  5/25 s/p PEG placement 5/30 IR evaluated PEG due to some drainage from PEG site, tube found to be in satisfactory position with no extraluminal contrast to suggest extravasation   Pt nonverbal and non-interactive. Discussed pt with RN who reports pt tolerating current TF regimen via PEG, though notes issues with elevated CBGs. Current regimen: Jevity 1.5 @ 6037mr w/ 31m56mosource TF TID and 100ml15me water Q4H. Will transition pt to Glucerna for blood sugar control.   UOP: 1150ml 15mmented today 3x unmeasured stool occurrences x24 hours  Medications: SSI, levemir, protonix, senokot-s, juven BID Labs  reviewed. CBGs: [phone] Order:   Diet Order            Diet NPO time specified  Diet effective midnight                EDUCATION NEEDS:  No education needs have been identified at this time  Skin:  Skin Assessment: Skin Integrity Issues: Skin Integrity Issues:: Stage II Stage II: L buttocks  Last BM:  5/30  Height:  Ht Readings from Last 1 Encounters:  01/23/21 6' (1.829 m)   Weight:  Wt Readings from Last 1 Encounters:  02/14/21 102.6 kg   Ideal Body Weight:  80.9 kg  BMI:  Body mass index is 30.68 kg/m.  Estimated Nutritional Needs:  Kcal:  2200-2400 Protein:  115-130 grams Fluid:  > 2 L/day   AmandaLarkin InaRD, LDN RD pager number and weekend/on-call pager number located in [email or web address]

## 2021-02-15 DIAGNOSIS — I169 Hypertensive crisis, unspecified: Secondary | ICD-10-CM | POA: Diagnosis not present

## 2021-02-15 DIAGNOSIS — L8990 Pressure ulcer of unspecified site, unspecified stage: Secondary | ICD-10-CM

## 2021-02-15 DIAGNOSIS — I610 Nontraumatic intracerebral hemorrhage in hemisphere, subcortical: Secondary | ICD-10-CM | POA: Diagnosis not present

## 2021-02-15 DIAGNOSIS — I471 Supraventricular tachycardia: Secondary | ICD-10-CM | POA: Diagnosis not present

## 2021-02-15 LAB — GLUCOSE, CAPILLARY
Glucose-Capillary: 105 mg/dL — ABNORMAL HIGH (ref 70–99)
Glucose-Capillary: 106 mg/dL — ABNORMAL HIGH (ref 70–99)
Glucose-Capillary: 108 mg/dL — ABNORMAL HIGH (ref 70–99)
Glucose-Capillary: 135 mg/dL — ABNORMAL HIGH (ref 70–99)
Glucose-Capillary: 143 mg/dL — ABNORMAL HIGH (ref 70–99)
Glucose-Capillary: 90 mg/dL (ref 70–99)

## 2021-02-15 NOTE — Consult Note (Signed)
   [facility] CM Inpatient Consult   02/15/2021  Pranay Hilbun [facility] Jan 23, 1936 [id number]   [place] [facility] [ACO] Patient: Brian [name] Medicare  Primary Care Provider: [name], MD, [place] with an Embedded Chronic Care Management team  Patient screened for long length of stay [22days]  hospitalization with to assess for potential needs for post hospital transition.  Review of patient's medical record reveals patient is being recommended for a skilled nursing facility level of care. Brief chart review, awaiting insurance authorization.  Plan:  Continue to follow progress and disposition to assess for post hospital care management needs.    For questions contact:   Natividad Brood, RN BSN [facility] Liaison  [phone] business mobile phone Toll free office [phone]  Fax number: [phone] [email or web address]

## 2021-02-15 NOTE — Progress Notes (Signed)
Physical Therapy Treatment Patient Details Name: Brian [name] MRN: [id number] DOB: Sep 19, 1935 Today's Date: 02/15/2021    History of Present Illness 85 y.o. M who presents with acute onset R sided weakness, facial droop, numbness, aphasia with left gaze preference. CTH showing L thalamic ICH.  S/P peg tube 5/25.Significant PMH: afib, DM2, HLD, HTN, stroke.    PT Comments    Patient continues to require totalA+2 for bed mobility and max-totalA to maintain sitting balance EOB due to posterior and R lateral lean. Patient with improved mood/affect and more calm with addition of gospel music. L gaze preference with x1 instance of crossing midline to R with auditory stimuli. Continue to recommend SNF for ongoing Physical Therapy.       Follow Up Recommendations  SNF     Equipment Recommendations  Other (comment) (defer)    Recommendations for Other Services       Precautions / Restrictions Precautions Precautions: Fall;Other (comment) Precaution Comments: R hemiparesis, global aphasia, PEG Restrictions Weight Bearing Restrictions: No    Mobility  Bed Mobility Overal bed mobility: Needs Assistance Bed Mobility: Supine to Sit;Sit to Supine     Supine to sit: Total assist;+2 for physical assistance;+2 for safety/equipment Sit to supine: Total assist;+2 for physical assistance;+2 for safety/equipment   General bed mobility comments: totalA+2 for all aspects of bed mobility. Not following commands and no initiation to EOB    Transfers                 General transfer comment: deferred due to poor sitting balance and not following commands  Ambulation/Gait                 Stairs             Wheelchair Mobility    Modified Rankin (Stroke Patients Only) Modified Rankin (Stroke Patients Only) Pre-Morbid Rankin Score: Slight disability Modified Rankin: Severe disability     Balance Overall balance assessment: Needs assistance Sitting-balance support:  Feet supported;Single extremity supported Sitting balance-Leahy Scale: Zero Sitting balance - Comments: max-totalA for sitting balance. Improved with therapist sitting beside patient on L to prevent opportunities to push with L UE. Postural control: Posterior lean;Right lateral lean                                  Cognition Arousal/Alertness: Lethargic;Awake/alert Behavior During Therapy: Flat affect Overall Cognitive Status: Difficult to assess Area of Impairment: Following commands                       Following Commands: Follows one step commands inconsistently       General Comments: Occasionally making eye contact but not sustaining. L gaze preference. Mood/affect improved and more calm with gospel music. No attempts at verbalizations this session      Exercises      General Comments        Pertinent Vitals/Pain Pain Assessment: Faces Faces Pain Scale: No hurt Pain Intervention(s): Monitored during session    Home Living                      Prior Function            PT Goals (current goals can now be found in the care plan section) Acute Rehab PT Goals Patient Stated Goal: none stated PT Goal Formulation: Patient unable to participate in goal setting Time For Goal  Achievement: 02/22/21 Potential to Achieve Goals: Fair Progress towards PT goals: Not progressing toward goals - comment    Frequency    Min 3X/week      PT Plan Current plan remains appropriate    Co-evaluation              AM-PAC PT "6 Clicks" Mobility   Outcome Measure  Help needed turning from your back to your side while in a flat bed without using bedrails?: Total Help needed moving from lying on your back to sitting on the side of a flat bed without using bedrails?: Total Help needed moving to and from a bed to a chair (including a wheelchair)?: Total Help needed standing up from a chair using your arms (e.g., wheelchair or bedside chair)?:  Total Help needed to walk in hospital room?: Total Help needed climbing 3-5 steps with a railing? : Total 6 Click Score: 6    End of Session   Activity Tolerance: Patient tolerated treatment well Patient left: in bed;with call bell/phone within reach;with bed alarm set Nurse Communication: Mobility status PT Visit Diagnosis: Other abnormalities of gait and mobility (R26.89);Hemiplegia and hemiparesis Hemiplegia - Right/Left: Right Hemiplegia - dominant/non-dominant: Dominant Hemiplegia - caused by: Nontraumatic intracerebral hemorrhage     Time: 7939-0300 PT Time Calculation (min) (ACUTE ONLY): 18 min  Charges:  $Therapeutic Activity: 8-22 mins                     Carrie Usery A. Gilford Rile PT, DPT Acute Rehabilitation Services Pager [phone] Office [phone]    Linna Hoff 02/15/2021, 4:20 PM

## 2021-02-15 NOTE — Progress Notes (Addendum)
PROGRESS NOTE    Brian [name]  DTO:[id number] DOB: 1936/09/09 DOA: 01/23/2021 PCP: [name], MD   Brief Narrative:   Brian [name] is a 85 y.o. male with PMH significant for history of atrial fibrillation on Eliquis, diabetes mellitus type 2, hyperlipidemia, hypertension, obesity, prior left MCA stroke who presented to hospital with acute right-sided weakness facial droop and right-sided numbness with aphasia and left gaze preference.  He had global aphasia to present with.  Patient was noted to have left thalamic intracranial hemorrhage with a 6 mm left to right shift on the CT scan.  Eliquis was reversed with andexanet alfa and was admitted to hospital for further evaluation and treatment.   During hospitalization, patient also required cleviprex infusion for hypertensive emergency through 01/26/2021.  He was admitted to neuro ICU under stroke service.  Patient developed fever and lethargy on 01/26/21.  Patient also failed a swallow evaluation and cortrak tube tube was placed in.  Subsequently, patient underwent PEG tube placement on 02/08/2021.  He continued to have persistent low-grade fever but leukocytosis trended down.  EEG showed focal left sided slowing but no seizures.  Repeat CT scan showed evolving intracranial hemorrhage.  Neurology had an impression of poor prognosis.  Patient was then transferred with ICU and hospitalist resumed care on 02/01/2021.  At this time, plan is disposition to skilled nursing facility.    Assessment & Plan   Intracerebral hemorrhage with cerebral edema and midline shift. Received a reversal agent with the injection at L4.  There was mild midline shift from left thymic intracranial hemorrhage.  Repeat CT scan showed stability.  Neurology saw the patient but did not recommend any surgical intervention.  Conservative management was pursued.  Patient will follow-up with neurology in 4 to 6 weeks.  Continue statins.  2D echocardiogram showed left-ventricular  ejection fraction of 55 to 60%.  Patient was seen by physical therapy, Occupational Therapy who recommended skilled nursing facility placement.  Fever/cough During 5 27-28.  Currently afebrile.  Chest x-Brian [name] unremarkable.  Urinalysis on 528 unremarkable for infection.   Procalcitonin was 0.2.  Antibiotics were not started.  At this time remains stable.  T-max of 99.3 F.  Blood cultures negative in 4 days.  Unable to do much chest physical therapy.  Hypertensive emergency On presentation.  Improved at this time.  Initially needed Cleviprex infusion.  SBP goal is less than 160.  Continue Coreg, losartan, amlodipine.  On as needed labetalol and hydralazine as well.    Sustained atrial tachycardia Cardiology was consulted.  Continue amiodarone and Coreg.    History of atrial fibrillation Anticoagulation on hold due to intracranial hemorrhage.  Continue amiodarone and Coreg.  Possible aspiration pneumonia Unable to do spirometry.  Completed 7-day course of Unasyn. Continue aspiration precautions.  Dysphagia Secondary to stroke.  Status post PEG tube placement on 02/08/2021.  Continue aspiration precautions.  Continue speech therapy to see for progress.  Continue tube feeds at this time.  IR has followed the PEG tube site and has considered that it is well-functioning.  Diabetes mellitus, type II -Continue Levemir, insulin sliding scale and CBG monitoring POC glucose of 143.  Acute anemia-normocytic/hypochromic anemia. Latest hemoglobin of 8.7.  Continue to monitor.  DVT Prophylaxis  SCDs  Code Status: Full  Family Communication:  I spoke with the patient's brother at bedside.  Disposition Plan:  Status is: Inpatient  Remains inpatient appropriate because:Inpatient level of care appropriate due to severity of illness   Dispo: The  patient is from: Home              Anticipated d/c is to: SNF              Patient currently is not medically stable to d/c.   Difficult to place patient  No  Consultants Neurology Cardiology Interventional radiology  Procedures  2D echocardiogram Carotid ultrasound Gastrostomy tube placement 02/08/21  Antibiotics   Anti-infectives (From admission, onward)   Start     Dose/Rate Route Frequency Ordered Stop   02/09/21 0600  ceFAZolin (ANCEF) IVPB 2g/100 mL premix  Status:  Discontinued        2 g 200 mL/hr over 30 Minutes Intravenous To Radiology 02/07/21 0924 02/07/21 0935   02/08/21 0850  ceFAZolin (ANCEF) IVPB 2g/100 mL premix        over 30 Minutes Intravenous Continuous PRN 02/08/21 0908 02/08/21 0850   02/08/21 0600  ceFAZolin (ANCEF) IVPB 2g/100 mL premix        2 g 200 mL/hr over 30 Minutes Intravenous To Radiology 02/07/21 0935 02/08/21 0604   01/27/21 2015  Ampicillin-Sulbactam (UNASYN) 3 g in sodium chloride 0.9 % 100 mL IVPB  Status:  Discontinued        3 g 200 mL/hr over 30 Minutes Intravenous Every 8 hours 01/27/21 1917 01/27/21 1918   01/27/21 1945  Ampicillin-Sulbactam (UNASYN) 3 g in sodium chloride 0.9 % 100 mL IVPB        3 g 200 mL/hr over 30 Minutes Intravenous Every 6 hours 01/27/21 1918 02/03/21 1000      Subjective:   Today, patient was seen and examined at bedside.  Patient is mostly nonverbal.  Patient's brother at bedside.  Objective:   Vitals:   02/15/21 0754 02/15/21 0945 02/15/21 1157 02/15/21 1200  BP: (!) 157/73 (!) 155/78 (!) 117/51 (!) 117/51  Pulse: 68 69 (!) 57 (!) 57  Resp: 18 17 16 17   Temp: 98.8 F (37.1 C)  98.6 F (37 C)   TempSrc: Oral  Oral   SpO2: 100%  96% 100%  Weight:      Height:        Intake/Output Summary (Last 24 hours) at 02/15/2021 1340 Last data filed at 02/15/2021 2355 Gross per 24 hour  Intake --  Output 1600 ml  Net -1600 ml   Filed Weights   02/14/21 0321 02/14/21 0417 02/15/21 0500  Weight: 105.1 kg 102.6 kg 103.3 kg   Physical exam General: Obese built, not in obvious distress, somnolent, nonverbal, HENT:   No scleral pallor or icterus noted. Oral  mucosa is dry. Chest:  Clear breath sounds.  Diminished breath sounds bilaterally. No crackles or wheezes.  CVS: S1 &S2 heard. No murmur.  Irregular rhythm. Abdomen: Soft, nontender, nondistended.  Bowel sounds are heard.  PEG tube in place, external urinary catheter in place. Extremities: No cyanosis, clubbing or edema.  Peripheral pulses are palpable. Psych: Somnolent, nonverbal, CNS: Somnolent, nonverbal Skin: Blister over the right posterior thigh.   Data Reviewed: I have personally reviewed following labs and imaging studies  CBC: Recent Labs  Lab 02/09/21 0029 02/10/21 0300 02/11/21 0638 02/13/21 0357 02/14/21 0247  WBC 10.4  --  10.5 7.9 6.5  HGB 8.5* 8.8* 8.7* 8.7* 8.7*  HCT 28.9* 29.4* 29.1* 29.2* 29.3*  MCV 87.8  --  87.1 86.9 85.9  PLT 305  --  267 287 732   Basic Metabolic Panel: Recent Labs  Lab 02/09/21 0029 02/10/21 0300 02/11/21 2025 02/14/21 0247  NA 132* 136 137 136  K 4.5 4.4 4.7 4.5  CL 100 101 100 100  CO2 24 27 26 28   GLUCOSE 181* 148* 124* 167*  BUN 44* 45* 36* 46*  CREATININE 1.10 1.15 1.04 1.09  CALCIUM 8.9 9.4 9.1 9.3   GFR: Estimated Creatinine Clearance: 63.8 mL/min (by C-G formula based on SCr of 1.09 mg/dL). Liver Function Tests: No results for input(s): AST, ALT, ALKPHOS, BILITOT, PROT, ALBUMIN in the last 168 hours. No results for input(s): LIPASE, AMYLASE in the last 168 hours. No results for input(s): AMMONIA in the last 168 hours. Coagulation Profile: No results for input(s): INR, PROTIME in the last 168 hours. Cardiac Enzymes: No results for input(s): CKTOTAL, CKMB, CKMBINDEX, TROPONINI in the last 168 hours. BNP (last 3 results) No results for input(s): PROBNP in the last 8760 hours. HbA1C: No results for input(s): HGBA1C in the last 72 hours. CBG: Recent Labs  Lab 02/14/21 1949 02/15/21 0002 02/15/21 0401 02/15/21 0822 02/15/21 1133  GLUCAP 150* 108* 105* 106* 143*   Lipid Profile: No results for input(s): CHOL,  HDL, LDLCALC, TRIG, CHOLHDL, LDLDIRECT in the last 72 hours. Thyroid Function Tests: No results for input(s): TSH, T4TOTAL, FREET4, T3FREE, THYROIDAB in the last 72 hours. Anemia Panel: No results for input(s): VITAMINB12, FOLATE, FERRITIN, TIBC, IRON, RETICCTPCT in the last 72 hours. Urine analysis:    Component Value Date/Time   COLORURINE YELLOW 02/11/2021 0725   APPEARANCEUR CLEAR 02/11/2021 0725   LABSPEC 1.024 02/11/2021 0725   PHURINE 5.0 02/11/2021 0725   GLUCOSEU NEGATIVE 02/11/2021 0725   GLUCOSEU 500 (A) 11/14/2020 1345   HGBUR NEGATIVE 02/11/2021 0725   BILIRUBINUR NEGATIVE 02/11/2021 0725   KETONESUR NEGATIVE 02/11/2021 0725   PROTEINUR NEGATIVE 02/11/2021 0725   UROBILINOGEN 0.2 11/14/2020 1345   NITRITE NEGATIVE 02/11/2021 0725   LEUKOCYTESUR NEGATIVE 02/11/2021 0725   Sepsis Labs: @LABRCNTIP (procalcitonin:4,lacticidven:4)  ) Recent Results (from the past 240 hour(s))  Surgical PCR screen     Status: None   Collection Time: 02/08/21  3:51 AM   Specimen: Nasal Mucosa; Nasal Swab  Result Value Ref Range Status   MRSA, PCR NEGATIVE NEGATIVE Final   Staphylococcus aureus NEGATIVE NEGATIVE Final    Comment: (NOTE) The Xpert SA Assay (FDA approved for NASAL specimens in patients 83 years of age and older), is one component of a comprehensive surveillance program. It is not intended to diagnose infection nor to guide or monitor treatment. Performed at [facility], [place] [address] [place], [place] [address]   Culture, blood (routine x 2)     Status: None (Preliminary result)   Collection Time: 02/11/21  6:38 AM   Specimen: BLOOD  Result Value Ref Range Status   Specimen Description   Final    BLOOD LEFT ANTECUBITAL Performed at [facility], [address] [place], [place] [address]    Special Requests   Final    BOTTLES DRAWN AEROBIC AND ANAEROBIC Blood Culture adequate volume Performed at [facility], [address]  [place], [place] [address]    Culture   Final    NO GROWTH 4 DAYS Performed at [facility], [place] [address], [place], [place] [address]    Report Status PENDING  Incomplete  Culture, blood (routine x 2)     Status: None (Preliminary result)   Collection Time: 02/11/21  6:41 AM   Specimen: BLOOD LEFT ARM  Result Value Ref Range Status  Specimen Description   Final    BLOOD LEFT ARM Performed at [facility], [address] [place], [place] [address]    Special Requests   Final    BOTTLES DRAWN AEROBIC AND ANAEROBIC Blood Culture adequate volume Performed at [facility], [address] [place], [place] [address]    Culture   Final    NO GROWTH 4 DAYS Performed at [facility], [place] [address] [place], [place] [address]    Report Status PENDING  Incomplete      Radiology Studies: No results found.   Scheduled Meds: .  stroke: mapping our early stages of recovery book   Does not apply Once  . amiodarone  400 mg Per Tube Daily   Followed by  . [START ON 02/28/2021] amiodarone  200 mg Per Tube Daily  . amLODipine  10 mg Per Tube Daily  . carvedilol  25 mg Per Tube BID WC  . chlorhexidine  15 mL Mouth Rinse BID  . enoxaparin (LOVENOX) injection  40 mg Subcutaneous Q24H  . free water  100 mL Per Tube Q4H  . gabapentin  100 mg Per Tube TID  . insulin aspart  3-9 Units Subcutaneous Q4H  . insulin detemir  25 Units Subcutaneous BID  . losartan  50 mg Per Tube BID  . mouth rinse  15 mL Mouth Rinse q12n4p  . nutrition supplement (JUVEN)  1 packet Per Tube BID BM  . pantoprazole sodium  40 mg Per Tube QHS  . senna-docusate  1 tablet Per Tube BID  . sodium chloride HYPERTONIC  4 mL Nebulization Daily   Continuous Infusions: . feeding supplement (GLUCERNA 1.5 CAL) 1,000 mL (02/15/21 1017)     LOS: 23 days   Aiyden Lauderback MD  02/15/2021 at 1:40 PM [facility] Office  [phone]

## 2021-02-15 NOTE — TOC Progression Note (Signed)
Transition of Care [facility]) - Progression Note    Patient Details  Name: Brian [name] MRN: [id number] Date of Birth: 08-26-1936  Transition of Care [facility]) CM/SW [place], [place] Phone Number: 02/15/2021, 11:40 AM  Clinical Narrative:   CSW notified by Eddie [facility] that authorization was denied by [facility], they are offering peer to peer. CSW sent information to MD and scheduled peer to peer. [employer] MD to call back prior to 4:30 today for peer to peer. CSW to follow.    Expected Discharge Plan: [place] Barriers to Discharge: Insurance Authorization,Continued Medical Work up  Expected Discharge Plan and Services Expected Discharge Plan: [place] In-house Referral: Clinical Social Work   Post Acute Care Choice: [place] Living arrangements for the past 2 months: Single Family Home                                       Social Determinants of Health (SDOH) Interventions    Readmission Risk Interventions No flowsheet data found.

## 2021-02-16 DIAGNOSIS — L8990 Pressure ulcer of unspecified site, unspecified stage: Secondary | ICD-10-CM | POA: Diagnosis not present

## 2021-02-16 DIAGNOSIS — I610 Nontraumatic intracerebral hemorrhage in hemisphere, subcortical: Secondary | ICD-10-CM | POA: Diagnosis not present

## 2021-02-16 DIAGNOSIS — I169 Hypertensive crisis, unspecified: Secondary | ICD-10-CM | POA: Diagnosis not present

## 2021-02-16 DIAGNOSIS — I471 Supraventricular tachycardia: Secondary | ICD-10-CM | POA: Diagnosis not present

## 2021-02-16 LAB — CULTURE, BLOOD (ROUTINE X 2)
Culture: NO GROWTH
Culture: NO GROWTH
Special Requests: ADEQUATE
Special Requests: ADEQUATE

## 2021-02-16 LAB — GLUCOSE, CAPILLARY
Glucose-Capillary: 115 mg/dL — ABNORMAL HIGH (ref 70–99)
Glucose-Capillary: 118 mg/dL — ABNORMAL HIGH (ref 70–99)
Glucose-Capillary: 123 mg/dL — ABNORMAL HIGH (ref 70–99)
Glucose-Capillary: 145 mg/dL — ABNORMAL HIGH (ref 70–99)
Glucose-Capillary: 150 mg/dL — ABNORMAL HIGH (ref 70–99)
Glucose-Capillary: 69 mg/dL — ABNORMAL LOW (ref 70–99)

## 2021-02-16 LAB — PHOSPHORUS: Phosphorus: 4.3 mg/dL (ref 2.5–4.6)

## 2021-02-16 LAB — MAGNESIUM: Magnesium: 2.5 mg/dL — ABNORMAL HIGH (ref 1.7–2.4)

## 2021-02-16 NOTE — Progress Notes (Signed)
Physical Therapy Treatment Patient Details Name: Brian [name] MRN: [id number] DOB: 1936-04-21 Today's Date: 02/16/2021    History of Present Illness 85 y.o. M who presents with acute onset R sided weakness, facial droop, numbness, aphasia with left gaze preference. CTH showing L thalamic ICH.  S/P peg tube 5/25.Significant PMH: afib, DM2, HLD, HTN, stroke.    PT Comments    Patient limited by lethargy. Patient with limited eye opening this session. TotalA+2 for repositioning in bed. Patient resistant to attempts of movement this session. Performed PROM of R LE to improve ROM. Continue to recommend SNF for ongoing Physical Therapy.      Follow Up Recommendations  SNF     Equipment Recommendations  Wheelchair (measurements PT);Wheelchair cushion (measurements PT);Hospital bed;3in1 (PT)    Recommendations for Other Services       Precautions / Restrictions Precautions Precautions: Fall;Other (comment) Precaution Comments: R hemiparesis, global aphasia, PEG Restrictions Weight Bearing Restrictions: No    Mobility  Bed Mobility Overal bed mobility: Needs Assistance             General bed mobility comments: totalA+2 for repositioning higher in bed. Deferred further due to lethargy    Transfers                    Ambulation/Gait                 Stairs             Wheelchair Mobility    Modified Rankin (Stroke Patients Only) Modified Rankin (Stroke Patients Only) Pre-Morbid Rankin Score: Slight disability Modified Rankin: Severe disability     Balance                                            Cognition Arousal/Alertness: Lethargic Behavior During Therapy: Flat affect Overall Cognitive Status: Difficult to assess Area of Impairment: Following commands                       Following Commands: Follows one step commands inconsistently       General Comments: No verbalizations. Not following commands       Exercises Other Exercises Other Exercises: PROM R knee extension    General Comments        Pertinent Vitals/Pain Pain Assessment: Faces Faces Pain Scale: No hurt Pain Intervention(s): Monitored during session    Home Living                      Prior Function            PT Goals (current goals can now be found in the care plan section) Acute Rehab PT Goals Patient Stated Goal: none stated PT Goal Formulation: Patient unable to participate in goal setting Time For Goal Achievement: 02/22/21 Potential to Achieve Goals: Fair Progress towards PT goals: Not progressing toward goals - comment    Frequency    Min 2X/week      PT Plan Frequency needs to be updated    Co-evaluation              AM-PAC PT "6 Clicks" Mobility   Outcome Measure  Help needed turning from your back to your side while in a flat bed without using bedrails?: Total Help needed moving from lying on your back to sitting on the side  of a flat bed without using bedrails?: Total Help needed moving to and from a bed to a chair (including a wheelchair)?: Total Help needed standing up from a chair using your arms (e.g., wheelchair or bedside chair)?: Total Help needed to walk in hospital room?: Total Help needed climbing 3-5 steps with a railing? : Total 6 Click Score: 6    End of Session   Activity Tolerance: Patient limited by lethargy Patient left: in bed;with call bell/phone within reach;with bed alarm set Nurse Communication: Mobility status PT Visit Diagnosis: Other abnormalities of gait and mobility (R26.89);Hemiplegia and hemiparesis Hemiplegia - Right/Left: Right Hemiplegia - dominant/non-dominant: Dominant Hemiplegia - caused by: Nontraumatic intracerebral hemorrhage     Time: 1347-1400 PT Time Calculation (min) (ACUTE ONLY): 13 min  Charges:  $Therapeutic Activity: 8-22 mins                     [place] Boccio A. Gilford Rile PT, DPT Acute Rehabilitation Services Pager  [phone] Office [phone]    Linna Hoff 02/16/2021, 2:44 PM

## 2021-02-16 NOTE — Progress Notes (Addendum)
PROGRESS NOTE    Brian [name]  QAS:[id number] DOB: Jun 21, 1936 DOA: 01/23/2021 PCP: [name], MD   Brief Narrative:   Brian [name] is a 85 y.o. male with PMH significant for history of atrial fibrillation on Eliquis, diabetes mellitus type 2, hyperlipidemia, hypertension, obesity, prior left MCA stroke who presented to hospital with acute right-sided weakness facial droop and right-sided numbness with aphasia and left gaze preference.  He had global aphasia to present with.  Patient was noted to have left thalamic intracranial hemorrhage with a 6 mm left to right shift on the CT scan.  Eliquis was reversed with andexanet alfa and was admitted to hospital for further evaluation and treatment.   During hospitalization, patient also required cleviprex infusion for hypertensive emergency through 01/26/2021.  He was admitted to neuro ICU under stroke service.  Patient developed fever and lethargy on 01/26/21.  Patient also failed a swallow evaluation and cortrak tube tube was placed in.  Subsequently, patient underwent PEG tube placement on 02/08/2021.  He continued to have persistent low-grade fever but leukocytosis trended down.  EEG showed focal left sided slowing but no seizures.  Repeat CT scan showed evolving intracranial hemorrhage.  Neurology had an impression of poor prognosis.  Patient was then transferred with ICU and hospitalist resumed care on 02/01/2021.  At this time, plan is disposition to skilled nursing facility.    Assessment & Plan   Intracerebral hemorrhage with cerebral edema and midline shift. Patient initially received reversal agent.  There was mild midline shift from left thymic intracranial hemorrhage.  Repeat CT scan showed stability.  Neurology saw the patient but did not recommend any surgical intervention.  Conservative management was pursued.  Patient will follow-up with neurology in 4 to 6 weeks.  Continue statins.  2D echocardiogram showed left-ventricular ejection  fraction of 55 to 60%.  Patient was seen by physical therapy, occupational Therapy who recommended skilled nursing facility placement.  Fever/cough During 5/27-5/28.  Currently afebrile.  Chest x-Mehran unremarkable.  Urinalysis on 5/28 was unremarkable for infection.   Procalcitonin was 0.2.  Antibiotics were not started.  At this time, patient remains stable.  T-max of 99.0 F.  Blood cultures negative in 5 days.  Unable to do much chest physical therapy.  Hypertensive emergency On presentation.  Improved at this time.  Initially needed Cleviprex infusion.  SBP goal is less than 160.  Continue Coreg, losartan, amlodipine.  On as needed labetalol and hydralazine as well.    Sustained atrial tachycardia Cardiology was consulted.  Continue amiodarone and Coreg.    History of atrial fibrillation Anticoagulation on hold due to intracranial hemorrhage.  Continue amiodarone and Coreg.  Possible aspiration pneumonia Unable to do spirometry.  Completed 7-day course of Unasyn. Continue aspiration precautions.  Dysphagia Secondary to stroke.  Status post PEG tube placement on 02/08/2021.  Continue aspiration precautions.  Continue speech therapy to see for progress.  Continue tube feeds at this time.  IR has followed the PEG tube site and has considered that it is well-functioning.  Diabetes mellitus, type II -Continue Levemir, insulin sliding scale and CBG monitoring POC glucose of 143.  Acute anemia-normocytic/hypochromic anemia. Latest hemoglobin of 8.7.  Continue to monitor.  DVT Prophylaxis  SCDs  Code Status: Full  Family Communication:  I spoke with the patient's brother at bedside yesterday on 02/15/2021.  I did call the patient's son on the phone but was unable to reach him.  Disposition Plan:  Status is: Inpatient  Remains  inpatient appropriate because:Inpatient level of care appropriate due to severity of illness   Dispo: The patient is from: Home              Anticipated d/c is  to: SNF as per PT recommendation...  Review was done with the [facility] attending.  Case has been declined for skilled nursing facility.  Transition of care updated.              Patient currently is not medically stable to d/c.   Difficult to place patient No  Consultants Neurology Cardiology Interventional radiology  Procedures  2D echocardiogram Carotid ultrasound Gastrostomy tube placement 02/08/21  Antibiotics   None at present Anti-infectives (From admission, onward)   Start     Dose/Rate Route Frequency Ordered Stop   02/09/21 0600  ceFAZolin (ANCEF) IVPB 2g/100 mL premix  Status:  Discontinued        2 g 200 mL/hr over 30 Minutes Intravenous To Radiology 02/07/21 0924 02/07/21 0935   02/08/21 0850  ceFAZolin (ANCEF) IVPB 2g/100 mL premix        over 30 Minutes Intravenous Continuous PRN 02/08/21 0908 02/08/21 0850   02/08/21 0600  ceFAZolin (ANCEF) IVPB 2g/100 mL premix        2 g 200 mL/hr over 30 Minutes Intravenous To Radiology 02/07/21 0935 02/08/21 0604   01/27/21 2015  Ampicillin-Sulbactam (UNASYN) 3 g in sodium chloride 0.9 % 100 mL IVPB  Status:  Discontinued        3 g 200 mL/hr over 30 Minutes Intravenous Every 8 hours 01/27/21 1917 01/27/21 1918   01/27/21 1945  Ampicillin-Sulbactam (UNASYN) 3 g in sodium chloride 0.9 % 100 mL IVPB        3 g 200 mL/hr over 30 Minutes Intravenous Every 6 hours 01/27/21 1918 02/03/21 1000      Subjective:   Today, patient was seen and examined at bedside.  Nursing staff reported that his blood glucose level was low.  No interval complaints reported.  Objective:   Vitals:   02/16/21 0357 02/16/21 0500 02/16/21 0810 02/16/21 1131  BP: (!) 155/74  (!) 154/73 130/60  Pulse: 68  64 63  Resp: 19  17 18   Temp: 98.9 F (37.2 C)  99 F (37.2 C) 98 F (36.7 C)  TempSrc: Oral  Oral Oral  SpO2: 97%  97% 90%  Weight:  102.8 kg    Height:        Intake/Output Summary (Last 24 hours) at 02/16/2021 1240 Last data filed at  02/16/2021 0357 Gross per 24 hour  Intake 1315 ml  Output 1150 ml  Net 165 ml   Filed Weights   02/14/21 0417 02/15/21 0500 02/16/21 0500  Weight: 102.6 kg 103.3 kg 102.8 kg   Physical exam  General: Obese, nonverbal, awake, not in obvious distress  HENT:   No scleral pallor or icterus noted. Oral mucosa is moist Chest:  Clear breath sounds.  Diminished breath sounds bilaterally. No crackles or wheezes.  CVS: S1 &S2 heard. No murmur.  Irregular rhythm. Abdomen: Soft, nontender, nondistended.  Bowel sounds are heard.  PEG tube in place, external urinary catheter in place.   Extremities: No cyanosis, clubbing or edema.  Peripheral pulses are palpable. Psych: Awake but nonverbal  CNS: Awake but nonverbal.  Right-sided weakness. Skin: Blister over the right posterior thigh.   Data Reviewed: I have personally reviewed following labs and imaging studies  CBC: Recent Labs  Lab 02/10/21 0300 02/11/21 9449 02/13/21 0357  02/14/21 0247  WBC  --  10.5 7.9 6.5  HGB 8.8* 8.7* 8.7* 8.7*  HCT 29.4* 29.1* 29.2* 29.3*  MCV  --  87.1 86.9 85.9  PLT  --  267 287 619   Basic Metabolic Panel: Recent Labs  Lab 02/10/21 0300 02/11/21 0638 02/14/21 0247 02/16/21 0434  NA 136 137 136  --   K 4.4 4.7 4.5  --   CL 101 100 100  --   CO2 27 26 28   --   GLUCOSE 148* 124* 167*  --   BUN 45* 36* 46*  --   CREATININE 1.15 1.04 1.09  --   CALCIUM 9.4 9.1 9.3  --   MG  --   --   --  2.5*  PHOS  --   --   --  4.3   GFR: Estimated Creatinine Clearance: 63.7 mL/min (by C-G formula based on SCr of 1.09 mg/dL). Liver Function Tests: No results for input(s): AST, ALT, ALKPHOS, BILITOT, PROT, ALBUMIN in the last 168 hours. No results for input(s): LIPASE, AMYLASE in the last 168 hours. No results for input(s): AMMONIA in the last 168 hours. Coagulation Profile: No results for input(s): INR, PROTIME in the last 168 hours. Cardiac Enzymes: No results for input(s): CKTOTAL, CKMB, CKMBINDEX,  TROPONINI in the last 168 hours. BNP (last 3 results) No results for input(s): PROBNP in the last 8760 hours. HbA1C: No results for input(s): HGBA1C in the last 72 hours. CBG: Recent Labs  Lab 02/15/21 2017 02/16/21 0041 02/16/21 0356 02/16/21 0814 02/16/21 1134  GLUCAP 135* 118* 123* 69* 145*   Lipid Profile: No results for input(s): CHOL, HDL, LDLCALC, TRIG, CHOLHDL, LDLDIRECT in the last 72 hours. Thyroid Function Tests: No results for input(s): TSH, T4TOTAL, FREET4, T3FREE, THYROIDAB in the last 72 hours. Anemia Panel: No results for input(s): VITAMINB12, FOLATE, FERRITIN, TIBC, IRON, RETICCTPCT in the last 72 hours. Urine analysis:    Component Value Date/Time   COLORURINE YELLOW 02/11/2021 0725   APPEARANCEUR CLEAR 02/11/2021 0725   LABSPEC 1.024 02/11/2021 0725   PHURINE 5.0 02/11/2021 0725   GLUCOSEU NEGATIVE 02/11/2021 0725   GLUCOSEU 500 (A) 11/14/2020 1345   HGBUR NEGATIVE 02/11/2021 0725   BILIRUBINUR NEGATIVE 02/11/2021 0725   KETONESUR NEGATIVE 02/11/2021 0725   PROTEINUR NEGATIVE 02/11/2021 0725   UROBILINOGEN 0.2 11/14/2020 1345   NITRITE NEGATIVE 02/11/2021 0725   LEUKOCYTESUR NEGATIVE 02/11/2021 0725   Sepsis Labs: @LABRCNTIP (procalcitonin:4,lacticidven:4)  ) Recent Results (from the past 240 hour(s))  Surgical PCR screen     Status: None   Collection Time: 02/08/21  3:51 AM   Specimen: Nasal Mucosa; Nasal Swab  Result Value Ref Range Status   MRSA, PCR NEGATIVE NEGATIVE Final   Staphylococcus aureus NEGATIVE NEGATIVE Final    Comment: (NOTE) The Xpert SA Assay (FDA approved for NASAL specimens in patients 64 years of age and older), is one component of a comprehensive surveillance program. It is not intended to diagnose infection nor to guide or monitor treatment. Performed at [facility], [place] [address], [place], [place] [address]   Culture, blood (routine x 2)     Status: None   Collection Time: 02/11/21  6:38 AM   Specimen: BLOOD   Result Value Ref Range Status   Specimen Description   Final    BLOOD LEFT ANTECUBITAL Performed at [facility], [address] [place], [place] [address]    Special Requests   Final  BOTTLES DRAWN AEROBIC AND ANAEROBIC Blood Culture adequate volume Performed at [facility], [address] [place], [place] [address]    Culture   Final    NO GROWTH 5 DAYS Performed at [facility], [place] [address], [place], [place] [address]    Report Status 02/16/2021 FINAL  Final  Culture, blood (routine x 2)     Status: None   Collection Time: 02/11/21  6:41 AM   Specimen: BLOOD LEFT ARM  Result Value Ref Range Status   Specimen Description   Final    BLOOD LEFT ARM Performed at [facility], [place] [place], [place] [address]    Special Requests   Final    BOTTLES DRAWN AEROBIC AND ANAEROBIC Blood Culture adequate volume Performed at [facility], [address] [place], [place] [address]    Culture   Final    NO GROWTH 5 DAYS Performed at [facility], [place] [address] [place], [place] [address]    Report Status 02/16/2021 FINAL  Final      Radiology Studies: No results found.   Scheduled Meds: .  stroke: mapping our early stages of recovery book   Does not apply Once  . amiodarone  400 mg Per Tube Daily   Followed by  . [START ON 02/28/2021] amiodarone  200 mg Per Tube Daily  . amLODipine  10 mg Per Tube Daily  . carvedilol  25 mg Per Tube BID WC  . chlorhexidine  15 mL Mouth Rinse BID  . enoxaparin (LOVENOX) injection  40 mg Subcutaneous Q24H  . free water  100 mL Per Tube Q4H  . gabapentin  100 mg Per Tube TID  . insulin aspart  3-9 Units Subcutaneous Q4H  . insulin detemir  25 Units Subcutaneous BID  . losartan  50 mg Per Tube BID  . mouth rinse  15 mL Mouth Rinse q12n4p  . nutrition supplement (JUVEN)  1 packet Per Tube BID BM  . pantoprazole sodium  40 mg Per Tube QHS  . senna-docusate  1 tablet Per  Tube BID   Continuous Infusions: . feeding supplement (GLUCERNA 1.5 CAL) 1,000 mL (02/16/21 0426)     LOS: 24 days   Flora Lipps MD  02/16/2021 at 12:40 PM [facility] Office  [phone]

## 2021-02-16 NOTE — TOC Progression Note (Addendum)
Transition of Care [facility]) - Progression Note    Patient Details  Name: LONEY DOMINGO MRN: [id number] Date of Birth: 1936-02-06  Transition of Care [facility] CM/SW [place], [place] Phone Number: 02/16/2021, 10:54 AM  Clinical Narrative:   CSW updated by MD that he never received a call from [facility] for peer to peer. CSW called Holland Falling to ask about peer to peer, and confirmed with Holland Falling that the peer to peer call was not attempted. RN sent a message to the [employer] MD to please call for the peer to peer as soon as possible. CSW updated MD to look for peer to peer call. CSW to follow.  UPDATE 12:34 PM: CSW notified that peer to peer was completed and denial was upheld. CSW contacted [employer] and submitted for expedited appeal. CSW to fax clinicals to support appeal request.   Expected Discharge Plan: Skilled Nursing Facility Barriers to Discharge: Insurance Authorization,Continued Medical Work up  Expected Discharge Plan and Services Expected Discharge Plan: [place] In-house Referral: Clinical Social Work   Post Acute Care Choice: [place] Living arrangements for the past 2 months: Single Family Home                                       Social Determinants of Health (SDOH) Interventions    Readmission Risk Interventions No flowsheet data found.

## 2021-02-16 NOTE — Progress Notes (Signed)
Inpatient Diabetes Program Recommendations  AACE/ADA: New Consensus Statement on Inpatient Glycemic Control (2015)  Target Ranges:  Prepandial:   less than 140 mg/dL      Peak postprandial:   less than 180 mg/dL (1-2 hours)      Critically ill patients:  140 - 180 mg/dL   Lab Results  Component Value Date   GLUCAP 69 (L) 02/16/2021   HGBA1C 6.1 (H) 01/28/2021    Review of Glycemic Control Results for [name], [name] (MRN [id number]) as of 02/16/2021 11:17  Ref. Range 02/16/2021 00:41 02/16/2021 03:56 02/16/2021 08:14  Glucose-Capillary Latest Ref Range: 70 - 99 mg/dL 118 (H) 123 (H) 69 (L)   Diabetes history: Type 2 DM Outpatient Diabetes medications: Basaglar 20 units QHS, Metformin 1000 mg BID, Januvia 100 mg QD Current orders for Inpatient glycemic control: Novolog 3-9 units Q4H, Levemir 25 units BID  Inpatient Diabetes Program Recommendations:    Noted mild hypo this AM of 69 mg/dL, if to remain inpatient may want to consider decreasing correction to Novolog 0-9 units Q4H (less resistant).   Thanks, Bronson Curb, MSN, RNC-OB Diabetes Coordinator [phone] (8a-5p)\

## 2021-02-16 NOTE — Progress Notes (Signed)
Occupational Therapy Treatment Patient Details Name: Brian [name] MRN: [id number] DOB: 03-21-1936 Today's Date: 02/16/2021    History of present illness 85 y.o. M who presents with acute onset R sided weakness, facial droop, numbness, aphasia with left gaze preference. CTH showing L thalamic ICH.  S/P peg tube 5/25.Significant PMH: afib, DM2, HLD, HTN, stroke.   OT comments  Patient supine in bed, +2 total assist to reposition into chair position in bed. Provided PROM to R UE and elevated UE on pillows to promote decreased edema.  R UE remains in flexor synergy patterns. Patient remains with L gaze, opens eyes briefly but remains with L gaze and no success with multimodal inputs to R side. Pt resistive with L UE to self care tasks when presented during [facility] support, pushing suction oral care and wash cloth away; passive to therapist washing face.  Attempted use of gospel music to engage pt.  Appears fatigued this afternoon.  Will follow acutely.    Follow Up Recommendations  SNF;Supervision/Assistance - 24 hour    Equipment Recommendations  Wheelchair (measurements OT);Wheelchair cushion (measurements OT);Hospital bed    Recommendations for Other Services      Precautions / Restrictions Precautions Precautions: Fall;Other (comment) Precaution Comments: R hemiparesis, global aphasia, PEG Restrictions Weight Bearing Restrictions: No       Mobility Bed Mobility Overal bed mobility: Needs Assistance             General bed mobility comments: totalA+2 for repositioning higher in bed. Deferred further due to lethargy    Transfers                      Balance                                           ADL either performed or assessed with clinical judgement   ADL Overall ADL's : Needs assistance/impaired     Grooming: Oral care;Wash/dry face Grooming Details (indicate cue type and reason): total assist for washing face, not resisting therapist but  resisting when attempiting to provide [facility] for pt  to complete task.  pt resisting thearpist when attempting engagment in suction oral care.                             Functional mobility during ADLs: Total assistance;+2 for physical assistance;+2 for safety/equipment       Vision   Vision Assessment?: Vision impaired- to be further tested in functional context Additional Comments: eyes open to name but remains in L gaze preference and no attempts to scan with tactile or audiotory stimuli   Perception     Praxis      Cognition Arousal/Alertness: Lethargic Behavior During Therapy: Flat affect Overall Cognitive Status: Difficult to assess Area of Impairment: Following commands                       Following Commands: Follows one step commands inconsistently       General Comments: No verbalizations. Not following commands. Resistive with L UE during functional ADL tasks.        Exercises Exercises: Other exercises Other Exercises Other Exercises: PROM R knee extension Other Exercises: PROM of R UE from shoulder to hand, prolonged gentle stretch into extension; placed UE on pillows to reduce edema  Shoulder Instructions       General Comments      Pertinent Vitals/ Pain       Pain Assessment: Faces Faces Pain Scale: No hurt Pain Intervention(s): Monitored during session  Home Living                                          Prior Functioning/Environment              Frequency  Min 2X/week        Progress Toward Goals  OT Goals(current goals can now be found in the care plan section)  Progress towards OT goals: Not progressing toward goals - comment (resistive to therapy, lethargic)  Acute Rehab OT Goals Patient Stated Goal: none stated OT Goal Formulation: Patient unable to participate in goal setting  Plan Discharge plan remains appropriate;Frequency remains appropriate    Co-evaluation                  AM-PAC OT "6 Clicks" Daily Activity     Outcome Measure   Help from another person eating meals?: Total Help from another person taking care of personal grooming?: Total Help from another person toileting, which includes using toliet, bedpan, or urinal?: Total Help from another person bathing (including washing, rinsing, drying)?: Total Help from another person to put on and taking off regular upper body clothing?: Total Help from another person to put on and taking off regular lower body clothing?: Total 6 Click Score: 6    End of Session    OT Visit Diagnosis: Unsteadiness on feet (R26.81);Muscle weakness (generalized) (M62.81);Other symptoms and signs involving cognitive function;Hemiplegia and hemiparesis Hemiplegia - Right/Left: Right Hemiplegia - dominant/non-dominant: Dominant Hemiplegia - caused by: Nontraumatic intracerebral hemorrhage   Activity Tolerance Patient limited by lethargy;Treatment limited secondary to agitation   Patient Left in bed;with call bell/phone within reach;with bed alarm set;with restraints reapplied   Nurse Communication Mobility status;Precautions        Time: 1330-1400 OT Time Calculation (min): 30 min  Charges: OT General Charges $OT Visit: 1 Visit OT Treatments $Self Care/Home Management : 8-22 mins  Jolaine Artist, OT [place] Pager [phone] Office [phone]    Brian [name] 02/16/2021, 3:51 PM

## 2021-02-16 NOTE — Progress Notes (Signed)
SLP Cancellation Note  Patient Details Name: Brian [name] MRN: [id number] DOB: 27-Apr-1936   Cancelled treatment:       Reason Eval/Treat Not Completed: Fatigue/lethargy limiting ability to participate.     [place] Dense, [place], [place] Acute Rehabilitation Services Office Number: [phone]  [name] 02/16/2021, 12:35 PM

## 2021-02-17 LAB — GLUCOSE, CAPILLARY
Glucose-Capillary: 105 mg/dL — ABNORMAL HIGH (ref 70–99)
Glucose-Capillary: 107 mg/dL — ABNORMAL HIGH (ref 70–99)
Glucose-Capillary: 113 mg/dL — ABNORMAL HIGH (ref 70–99)
Glucose-Capillary: 119 mg/dL — ABNORMAL HIGH (ref 70–99)
Glucose-Capillary: 126 mg/dL — ABNORMAL HIGH (ref 70–99)
Glucose-Capillary: 147 mg/dL — ABNORMAL HIGH (ref 70–99)
Glucose-Capillary: 169 mg/dL — ABNORMAL HIGH (ref 70–99)

## 2021-02-17 LAB — BASIC METABOLIC PANEL
Anion gap: 8 (ref 5–15)
BUN: 41 mg/dL — ABNORMAL HIGH (ref 8–23)
CO2: 27 mmol/L (ref 22–32)
Calcium: 9.5 mg/dL (ref 8.9–10.3)
Chloride: 102 mmol/L (ref 98–111)
Creatinine, Ser: 1.08 mg/dL (ref 0.61–1.24)
GFR, Estimated: >60 mL/min (ref >60)
Glucose, Bld: 126 mg/dL — ABNORMAL HIGH (ref 70–99)
Potassium: 4.7 mmol/L (ref 3.5–5.1)
Sodium: 137 mmol/L (ref 135–145)

## 2021-02-17 LAB — CBC
HCT: 35.5 % — ABNORMAL LOW (ref 39.0–52.0)
Hemoglobin: 10.3 g/dL — ABNORMAL LOW (ref 13.0–17.0)
MCH: 25.4 pg — ABNORMAL LOW (ref 26.0–34.0)
MCHC: 29 g/dL — ABNORMAL LOW (ref 30.0–36.0)
MCV: 87.7 fL (ref 80.0–100.0)
Platelets: 306 10*3/uL (ref 150–400)
RBC: 4.05 MIL/uL — ABNORMAL LOW (ref 4.22–5.81)
RDW: 17.1 % — ABNORMAL HIGH (ref 11.5–15.5)
WBC: 5.8 10*3/uL (ref 4.0–10.5)
nRBC: 6.9 % — ABNORMAL HIGH (ref 0.0–0.2)

## 2021-02-17 LAB — PHOSPHORUS: Phosphorus: 4.2 mg/dL (ref 2.5–4.6)

## 2021-02-17 LAB — MAGNESIUM: Magnesium: 2.4 mg/dL (ref 1.7–2.4)

## 2021-02-17 NOTE — Progress Notes (Addendum)
PROGRESS NOTE    Brian [name]  SEG:[id number] DOB: 07/03/1936 DOA: 01/23/2021 PCP: [name], MD   Brief Narrative:   Brian [name] is a 85 y.o. male with PMH significant for history of atrial fibrillation on Eliquis, diabetes mellitus type 2, hyperlipidemia, hypertension, obesity, prior left MCA stroke who presented to hospital with acute right-sided weakness facial droop and right-sided numbness with aphasia and left gaze preference.  He had global aphasia to present with.  Patient was noted to have left thalamic intracranial hemorrhage with a 6 mm left to right shift on the CT scan.  Eliquis was reversed with andexanet alfa and was admitted to hospital for further evaluation and treatment.   During hospitalization, patient also required cleviprex infusion for hypertensive emergency through 01/26/2021.  He was admitted to neuro ICU under stroke service.  Patient developed fever and lethargy on 01/26/21.  Patient also failed a swallow evaluation and cortrak tube tube was placed in.  Subsequently, patient underwent PEG tube placement on 02/08/2021.  He continued to have persistent low-grade fever but leukocytosis trended down.  EEG showed focal left sided slowing but no seizures.  Repeat CT scan showed evolving intracranial hemorrhage.  Neurology had an impression of poor prognosis.  Patient was then transferred with ICU and hospitalist resumed care on 02/01/2021.  At this time, plan is disposition to skilled nursing facility.    Assessment & Plan   Intracerebral hemorrhage with cerebral edema and midline shift. Patient initially received reversal agent.  There was mild midline shift from left thalamus intracranial hemorrhage.  Repeat CT scan showed stability.  Neurology saw the patient but did not recommend any surgical intervention.  Conservative management was pursued.  Patient will follow-up with neurology in 4 to 6 weeks.  Continue statins.  2D echocardiogram showed left-ventricular ejection  fraction of 55 to 60%.  Patient was seen by physical therapy, occupational Therapy who recommended skilled nursing facility placement.  Fever/cough During 02/10/21-02/11/21.  Currently afebrile.  Chest x-[place] was unremarkable.  Urinalysis on 02/11/21 was unremarkable for infection.   Procalcitonin was 0.2.  Antibiotics were not started.  At this time, patient remains stable.  T-max of 99.5 F.  Blood cultures negative in 5 days.  Unable to do much chest physical therapy.  Hypertensive emergency On presentation.  Improved at this time.  Initially needed Cleviprex infusion.  SBP goal is less than 160.  Continue Coreg, losartan, amlodipine.  On as needed labetalol and hydralazine as well.    Sustained atrial tachycardia Cardiology was consulted.  Continue amiodarone and Coreg.    History of atrial fibrillation Anticoagulation on hold due to intracranial hemorrhage.  Continue amiodarone and Coreg.  Possible aspiration pneumonia Unable to do spirometry.  Completed 7-day course of Unasyn. Continue aspiration precautions.  Dysphagia Secondary to stroke.  Status post PEG tube placement on 02/08/2021.  Continue aspiration precautions.  Continue speech therapy to see for progress.  Continue tube feeds at this time.  IR has followed the PEG tube site and has considered that it is well-functioning.  Diabetes mellitus, type II -Continue Levemir, insulin sliding scale and CBG monitoring POC glucose of 169.  Acute anemia-normocytic/hypochromic anemia. Latest hemoglobin of 8.7.  Continue to monitor.  DVT Prophylaxis  SCDs  Code Status: Full  Family Communication:  I spoke with the patient's brother at bedside on 02/15/2021.  I spoke with the patient's son and updated him about the clinical condition of the patient.  Disposition Plan:  Status is: Inpatient  Remains inpatient appropriate because:Inpatient level of care appropriate due to severity of illness   Dispo: The patient is from: Home               Anticipated d/c is to: SNF as per PT recommendation. Peer review was done with the [employer] attending.  Case has been declined for skilled nursing facility.  Transition of care updated.              Patient currently is not medically stable to d/c.   Difficult to place patient No  Consultants Neurology Cardiology Interventional radiology  Procedures  2D echocardiogram Carotid ultrasound Gastrostomy tube placement 02/08/21  Antibiotics   None at present  Subjective:   Today, patient was seen and examined at bedside.  Patient appears to be very somnolent and not much verbal.  Objective:   Vitals:   02/17/21 0054 02/17/21 0309 02/17/21 0500 02/17/21 1208  BP: (!) 143/76 (!) 150/67  135/65  Pulse: (!) 58 61  (!) 58  Resp: 18 19  16   Temp: 99.5 F (37.5 C) 99.5 F (37.5 C)  98.4 F (36.9 C)  TempSrc: Oral Oral  Oral  SpO2: 95% 97%  95%  Weight:   100.5 kg   Height:        Intake/Output Summary (Last 24 hours) at 02/17/2021 1404 Last data filed at 02/17/2021 5809 Gross per 24 hour  Intake --  Output 1050 ml  Net -1050 ml   Filed Weights   02/15/21 0500 02/16/21 0500 02/17/21 0500  Weight: 103.3 kg 102.8 kg 100.5 kg   Physical exam  General: Obese built, not in obvious distress, nonverbal, somnolent today, responds to painful stools HENT:   No scleral pallor or icterus noted. Oral mucosa is moist.  Chest:  Clear breath sounds.  Diminished breath sounds bilaterally. No crackles or wheezes.  CVS: S1 &S2 heard. No murmur.  Regular rate and rhythm. Abdomen: Soft, nontender, nondistended.  Bowel sounds are heard.  PEG tube in place.  External urinary catheter in place. Extremities: No cyanosis, clubbing or edema.  Peripheral pulses are palpable. Psych: Somnolent, CNS: Somnolent today, response to painful stimulus.  Right-sided weakness. Skin: Warm and dry.  Blisters over the right posterior thigh.  Data Reviewed: I have personally reviewed following labs and  imaging studies  CBC: Recent Labs  Lab 02/11/21 0638 02/13/21 0357 02/14/21 0247 02/17/21 0610  WBC 10.5 7.9 6.5 5.8  HGB 8.7* 8.7* 8.7* 10.3*  HCT 29.1* 29.2* 29.3* 35.5*  MCV 87.1 86.9 85.9 87.7  PLT 267 287 301 983   Basic Metabolic Panel: Recent Labs  Lab 02/11/21 0638 02/14/21 0247 02/16/21 0434 02/17/21 0610  NA 137 136  --  137  K 4.7 4.5  --  4.7  CL 100 100  --  102  CO2 26 28  --  27  GLUCOSE 124* 167*  --  126*  BUN 36* 46*  --  41*  CREATININE 1.04 1.09  --  1.08  CALCIUM 9.1 9.3  --  9.5  MG  --   --  2.5* 2.4  PHOS  --   --  4.3 4.2   GFR: Estimated Creatinine Clearance: 63.6 mL/min (by C-G formula based on SCr of 1.08 mg/dL). Liver Function Tests: No results for input(s): AST, ALT, ALKPHOS, BILITOT, PROT, ALBUMIN in the last 168 hours. No results for input(s): LIPASE, AMYLASE in the last 168 hours. No results for input(s): AMMONIA in the last 168 hours. Coagulation Profile:  No results for input(s): INR, PROTIME in the last 168 hours. Cardiac Enzymes: No results for input(s): CKTOTAL, CKMB, CKMBINDEX, TROPONINI in the last 168 hours. BNP (last 3 results) No results for input(s): PROBNP in the last 8760 hours. HbA1C: No results for input(s): HGBA1C in the last 72 hours. CBG: Recent Labs  Lab 02/16/21 2020 02/17/21 0054 02/17/21 0353 02/17/21 0723 02/17/21 1209  GLUCAP 150* 113* 126* 119* 169*   Lipid Profile: No results for input(s): CHOL, HDL, LDLCALC, TRIG, CHOLHDL, LDLDIRECT in the last 72 hours. Thyroid Function Tests: No results for input(s): TSH, T4TOTAL, FREET4, T3FREE, THYROIDAB in the last 72 hours. Anemia Panel: No results for input(s): VITAMINB12, FOLATE, FERRITIN, TIBC, IRON, RETICCTPCT in the last 72 hours. Urine analysis:    Component Value Date/Time   COLORURINE YELLOW 02/11/2021 0725   APPEARANCEUR CLEAR 02/11/2021 0725   LABSPEC 1.024 02/11/2021 0725   PHURINE 5.0 02/11/2021 0725   GLUCOSEU NEGATIVE 02/11/2021 0725    GLUCOSEU 500 (A) 11/14/2020 1345   HGBUR NEGATIVE 02/11/2021 0725   BILIRUBINUR NEGATIVE 02/11/2021 0725   KETONESUR NEGATIVE 02/11/2021 0725   PROTEINUR NEGATIVE 02/11/2021 0725   UROBILINOGEN 0.2 11/14/2020 1345   NITRITE NEGATIVE 02/11/2021 0725   LEUKOCYTESUR NEGATIVE 02/11/2021 0725   Sepsis Labs: @LABRCNTIP (procalcitonin:4,lacticidven:4)  ) Recent Results (from the past 240 hour(s))  Surgical PCR screen     Status: None   Collection Time: 02/08/21  3:51 AM   Specimen: Nasal Mucosa; Nasal Swab  Result Value Ref Range Status   MRSA, PCR NEGATIVE NEGATIVE Final   Staphylococcus aureus NEGATIVE NEGATIVE Final    Comment: (NOTE) The Xpert SA Assay (FDA approved for NASAL specimens in patients 83 years of age and older), is one component of a comprehensive surveillance program. It is not intended to diagnose infection nor to guide or monitor treatment. Performed at [facility], [place] [address], [place], [place] [address]   Culture, blood (routine x 2)     Status: None   Collection Time: 02/11/21  6:38 AM   Specimen: BLOOD  Result Value Ref Range Status   Specimen Description   Final    BLOOD LEFT ANTECUBITAL Performed at [facility], [address] [place], [place] [address]    Special Requests   Final    BOTTLES DRAWN AEROBIC AND ANAEROBIC Blood Culture adequate volume Performed at [facility], [address] [place], [place] [address]    Culture   Final    NO GROWTH 5 DAYS Performed at [facility], [place] [address] [place], [place] [address]    Report Status 02/16/2021 FINAL  Final  Culture, blood (routine x 2)     Status: None   Collection Time: 02/11/21  6:41 AM   Specimen: BLOOD LEFT ARM  Result Value Ref Range Status   Specimen Description   Final    BLOOD LEFT ARM Performed at [facility], [address] [place], [place] [address]    Special Requests   Final    BOTTLES DRAWN AEROBIC AND ANAEROBIC Blood  Culture adequate volume Performed at [facility], [address] [place], [place] [address]    Culture   Final    NO GROWTH 5 DAYS Performed at [facility], [place] [address] [place], [place] [address]    Report Status 02/16/2021 FINAL  Final      Radiology Studies: No results found.   Scheduled  Meds: .  stroke: mapping our early stages of recovery book   Does not apply Once  . amiodarone  400 mg Per Tube Daily   Followed by  . [START ON 02/28/2021] amiodarone  200 mg Per Tube Daily  . amLODipine  10 mg Per Tube Daily  . carvedilol  25 mg Per Tube BID WC  . chlorhexidine  15 mL Mouth Rinse BID  . enoxaparin (LOVENOX) injection  40 mg Subcutaneous Q24H  . free water  100 mL Per Tube Q4H  . gabapentin  100 mg Per Tube TID  . insulin aspart  3-9 Units Subcutaneous Q4H  . insulin detemir  25 Units Subcutaneous BID  . losartan  50 mg Per Tube BID  . mouth rinse  15 mL Mouth Rinse q12n4p  . nutrition supplement (JUVEN)  1 packet Per Tube BID BM  . pantoprazole sodium  40 mg Per Tube QHS  . senna-docusate  1 tablet Per Tube BID   Continuous Infusions: . feeding supplement (GLUCERNA 1.5 CAL) 1,000 mL (02/16/21 2333)     LOS: 25 days   Flora Lipps MD  02/17/2021 at 2:04 PM   [facility] Office  [phone]

## 2021-02-17 NOTE — Plan of Care (Signed)
  Problem: Health Behavior/Discharge Planning: Goal: Ability to manage health-related needs will improve Outcome: Progressing   Problem: Clinical Measurements: Goal: Ability to maintain clinical measurements within normal limits will improve Outcome: Progressing Goal: Will remain free from infection Outcome: Progressing Goal: Diagnostic test results will improve Outcome: Progressing Goal: Respiratory complications will improve Outcome: Progressing Goal: Cardiovascular complication will be avoided Outcome: Progressing   Problem: Nutrition: Goal: Adequate nutrition will be maintained Outcome: Progressing   Problem: Education: Goal: Knowledge of disease or condition will improve Outcome: Progressing Goal: Knowledge of secondary prevention will improve Outcome: Progressing Goal: Knowledge of patient specific risk factors addressed and post discharge goals established will improve Outcome: Progressing   Problem: Intracerebral Hemorrhage Tissue Perfusion: Goal: Complications of Intracerebral Hemorrhage will be minimized Outcome: Progressing   Problem: Education: Goal: Knowledge of disease or condition will improve Outcome: Progressing Goal: Knowledge of secondary prevention will improve Outcome: Progressing Goal: Knowledge of patient specific risk factors addressed and post discharge goals established will improve Outcome: Progressing Goal: Individualized Educational Video(s) Outcome: Progressing   Problem: Health Behavior/Discharge Planning: Goal: Ability to manage health-related needs will improve Outcome: Progressing   Problem: Self-Care: Goal: Ability to participate in self-care as condition permits will improve Outcome: Progressing   Problem: Nutrition: Goal: Risk of aspiration will decrease Outcome: Progressing Goal: Dietary intake will improve Outcome: Progressing   Problem: Intracerebral Hemorrhage Tissue Perfusion: Goal: Complications of Intracerebral  Hemorrhage will be minimized Outcome: Progressing

## 2021-02-17 NOTE — TOC Progression Note (Addendum)
Transition of Care (TOC) - Progression Note  CSW faxed updated therapy notes to [employer] for the ongoing appeal. Attempted to update son, [name], a VM was left. CSW spoke w/ dtr Ennis Forts, who will call CSW back when she is out of her meeting. SW will continue to follow.  CSW spoke with [place] and she was informed that [facility] had denied SNF and an appeal was currently in process. CSW advised her of the need for Medicaid and that [facility] would take him with pending medicaid. She was frustrated with this and unsure if pt would qualify. CSW noted that if the insurance upheld the denial, that would be the only option outside of private paying. She was advised to consider consulting an elder law lawyer for more assistance.  Ennis Forts also asked about having him go home with 24 hour care, as her and her brother live out of state. CSW discussed the barriers with that and the amount of care he will need. CSW will follow up with daughter when the appeal results and discuss next steps.    4:00pm CSW received phone call from daughter who updated CSW that pt has LTC insurance through [employer]. She was unable to provide policy details at this time, but will get them from her brother. CSW spoke with facility who noted they would need policy info to see if they can use it. SW will follow up tomorrow for policy info. Patient Details  Name: Brian [name] MRN: [id number] Date of Birth: [birth year]  Transition of Care [facility]) CM/SW [place], [place] Phone Number: 02/17/2021, 10:36 AM  Clinical Narrative:       Expected Discharge Plan: [place] Barriers to Discharge: Insurance Authorization,Continued Medical Work up  Expected Discharge Plan and Services Expected Discharge Plan: [place] In-house Referral: Clinical Social Work   Post Acute Care Choice: [place] Living arrangements for the past 2 months: Single Family Home                                        Social Determinants of Health (SDOH) Interventions    Readmission Risk Interventions No flowsheet data found.

## 2021-02-17 NOTE — Progress Notes (Signed)
  Speech Language Pathology Treatment: Dysphagia;Cognitive-Linquistic  Patient Details Name: MARCUS SCHWANDT MRN: [id number] DOB: [birth year] Today's Date: 02/17/2021 Time: 1224-8250 SLP Time Calculation (min) (ACUTE ONLY): 25 min  Assessment / Plan / Recommendation Clinical Impression  Pt fully alert with brother at bedside. SLP attempted Po trials, but pt very defensive of his mouth, tried to push anything approaching him away. However, when water touched his lips on a spoon he did sip and swallow, though sips were followed by coughing. Used brother as a therapeutic agent and he did eventually relax and allow a sip at least once. Attempted singing and MIT techniques with counting with no result. Will continue efforts.    HPI HPI: Pt is a 85 y.o. M who presents with acute onset R sided weakness, facial droop, numbness, aphasia with left gaze preference. CTH showing L thalamic ICH. Significant PMH: afib, DM2, HLD, HTN, stroke. Persistent dysphagia s/p PEG 02/08/21.      SLP Plan  Continue with current plan of care       Recommendations  Diet recommendations: NPO                Plan: Continue with current plan of care       GO               [name], MA [place] Pager [phone] Office [phone]  Lynann Beaver 02/17/2021, 2:11 PM

## 2021-02-18 LAB — GLUCOSE, CAPILLARY
Glucose-Capillary: 114 mg/dL — ABNORMAL HIGH (ref 70–99)
Glucose-Capillary: 91 mg/dL (ref 70–99)
Glucose-Capillary: 147 mg/dL — ABNORMAL HIGH (ref 70–99)
Glucose-Capillary: 147 mg/dL — ABNORMAL HIGH (ref 70–99)
Glucose-Capillary: 112 mg/dL — ABNORMAL HIGH (ref 70–99)
Glucose-Capillary: 94 mg/dL (ref 70–99)

## 2021-02-18 LAB — MAGNESIUM: Magnesium: 2.5 mg/dL — ABNORMAL HIGH (ref 1.7–2.4)

## 2021-02-18 LAB — PHOSPHORUS: Phosphorus: 4.3 mg/dL (ref 2.5–4.6)

## 2021-02-18 NOTE — Progress Notes (Signed)
PROGRESS NOTE    Brian [name]  HYI:[id number] DOB: [birth year] DOA: 01/23/2021 PCP: [name], MD   Brief Narrative:   Brian [name] is a 85 y.o. male with PMH significant for history of atrial fibrillation on Eliquis, diabetes mellitus type 2, hyperlipidemia, hypertension, obesity, prior left MCA stroke who presented to hospital with acute right-sided weakness facial droop and right-sided numbness with aphasia and left gaze preference.  He had global aphasia to present with.  Patient was noted to have left thalamic intracranial hemorrhage with a 6 mm left to right shift on the CT scan.  Eliquis was reversed with andexanet alfa and was admitted to hospital for further evaluation and treatment.   During hospitalization, patient also required cleviprex infusion for hypertensive emergency through 01/26/2021.  He was admitted to neuro ICU under stroke service.  Patient developed fever and lethargy on 01/26/21.  Patient also failed a swallow evaluation and cortrak tube tube was placed in.  Subsequently, patient underwent PEG tube placement on 02/08/2021.  He continued to have persistent low-grade fever but leukocytosis trended down.  EEG showed focal left sided slowing but no seizures.  Repeat CT scan showed evolving intracranial hemorrhage.  Neurology had an impression of poor prognosis.  Patient was then transferred with ICU and hospitalist resumed care on 02/01/2021.  At this time, plan is disposition to skilled nursing facility.    Assessment & Plan   Intracerebral hemorrhage with cerebral edema and midline shift. Patient initially received reversal agent for anticoagulant reversal.  There was mild midline shift from left thalamus intracranial hemorrhage.  Repeat CT scan showed stability.  Neurology saw the patient but did not recommend any surgical intervention.  Conservative management was pursued.  Patient will follow-up with neurology in 4 to 6 weeks.  Continue statins.  2D echocardiogram showed  left-ventricular ejection fraction of 55 to 60%.  Patient was seen by physical therapy, occupational therapy who recommended skilled nursing facility placement.  Fever/cough Chest x-Boby unremarkable, UA unremarkable, procalcitonin 0.2.  No indication for antibiotics. latest T-max of 98.6).  Hypertensive emergency On presentation.  Improved at this time.  Initially needed Cleviprex infusion.  SBP goal is less than 160.  Continue Coreg, losartan, amlodipine.  On as needed labetalol and hydralazine as well.    Sustained atrial tachycardia Cardiology was consulted.  Continue amiodarone and Coreg.    History of atrial fibrillation Anticoagulation on hold due to intracranial hemorrhage.  Continue amiodarone and Coreg.  Possible aspiration pneumonia Unable to do spirometry.  Completed 7-day course of Unasyn. Continue aspiration precautions.  Dysphagia Secondary to stroke.  Status post PEG tube placement on 02/08/2021.  Continue aspiration precautions.  Seen by speech therapy on 02/17/2021 and recommendation is to continue PEG tube..  Continue tube feeds at this time.  IR has followed the PEG tube site and has considered that it is well-functioning.  Diabetes mellitus, type II -Continue Levemir, insulin sliding scale and CBG monitoring POC glucose of 91.  Acute anemia-normocytic/hypochromic anemia. Latest hemoglobin of 10.3.  No evidence of bleeding.  Continue to monitor.  DVT Prophylaxis  SCDs  Code Status: Full  Family Communication:  Spoke with the patient's son and updated him about the clinical condition of the patient on 02/17/2021.     Disposition Plan:  Status is: Inpatient  Remains inpatient appropriate because:Inpatient level of care appropriate due to severity of illness   Dispo: The patient is from: Home  Anticipated d/c is to: SNF as per PT recommendation.  Patient has been declined for skilled nursing facility.  Transition of care on board for appropriate  disposition.              Patient currently is not  medically stable to d/c.   Difficult to place patient No  Consultants Neurology Cardiology Interventional radiology  Procedures  2D echocardiogram Carotid ultrasound Gastrostomy tube placement 02/08/21  Antibiotics   None at present  Subjective:   Today, patient was seen and examined at bedside.  Patient is not much verbal.  On mittens.   Objective:   Vitals:   02/17/21 2350 02/18/21 0404 02/18/21 0500 02/18/21 0748  BP: (!) 123/59 (!) 146/66  (!) 157/74  Pulse: (!) 50 62  62  Resp: 14 18  14   Temp: 98.5 F (36.9 C) 98.6 F (37 C)  98.6 F (37 C)  TempSrc: Oral Oral    SpO2: 95% 97%  98%  Weight:   101.7 kg   Height:        Intake/Output Summary (Last 24 hours) at 02/18/2021 0833 Last data filed at 02/18/2021 0456 Gross per 24 hour  Intake --  Output 1800 ml  Net -1800 ml   Filed Weights   02/16/21 0500 02/17/21 0500 02/18/21 0500  Weight: 102.8 kg 100.5 kg 101.7 kg   Physical exam  General: Obese built, not in obvious distress, not much verbal, moves left extremities HENT:   No scleral pallor or icterus noted. Oral mucosa is moist.  Chest:  Clear breath sounds.  Diminished breath sounds bilaterally. No crackles or wheezes.  CVS: S1 &S2 heard. No murmur.  Regular rate and rhythm. Abdomen: Soft, nontender, nondistended.  Bowel sounds are heard.  PEG tube in place.  External urinary catheter in place. Extremities: No cyanosis, clubbing or edema.  Peripheral pulses are palpable. Psych: Somnolent, CNS: Nonverbal, moves left extremities.  Right-sided weakness. Skin: Warm and dry.  Blisters over the right posterior thigh.  Data Reviewed: I have personally reviewed following labs and imaging studies  CBC: Recent Labs  Lab 02/13/21 0357 02/14/21 0247 02/17/21 0610  WBC 7.9 6.5 5.8  HGB 8.7* 8.7* 10.3*  HCT 29.2* 29.3* 35.5*  MCV 86.9 85.9 87.7  PLT 287 301 016   Basic Metabolic Panel: Recent Labs  Lab  02/14/21 0247 02/16/21 0434 02/17/21 0610 02/18/21 0419  NA 136  --  137  --   K 4.5  --  4.7  --   CL 100  --  102  --   CO2 28  --  27  --   GLUCOSE 167*  --  126*  --   BUN 46*  --  41*  --   CREATININE 1.09  --  1.08  --   CALCIUM 9.3  --  9.5  --   MG  --  2.5* 2.4 2.5*  PHOS  --  4.3 4.2 4.3   GFR: Estimated Creatinine Clearance: 63.9 mL/min (by C-G formula based on SCr of 1.08 mg/dL). Liver Function Tests: No results for input(s): AST, ALT, ALKPHOS, BILITOT, PROT, ALBUMIN in the last 168 hours. No results for input(s): LIPASE, AMYLASE in the last 168 hours. No results for input(s): AMMONIA in the last 168 hours. Coagulation Profile: No results for input(s): INR, PROTIME in the last 168 hours. Cardiac Enzymes: No results for input(s): CKTOTAL, CKMB, CKMBINDEX, TROPONINI in the last 168 hours. BNP (last 3 results) No results for input(s): PROBNP in the  last 8760 hours. HbA1C: No results for input(s): HGBA1C in the last 72 hours. CBG: Recent Labs  Lab 02/17/21 1627 02/17/21 1934 02/17/21 2324 02/18/21 0304 02/18/21 0803  GLUCAP 147* 105* 107* 114* 91   Lipid Profile: No results for input(s): CHOL, HDL, LDLCALC, TRIG, CHOLHDL, LDLDIRECT in the last 72 hours. Thyroid Function Tests: No results for input(s): TSH, T4TOTAL, FREET4, T3FREE, THYROIDAB in the last 72 hours. Anemia Panel: No results for input(s): VITAMINB12, FOLATE, FERRITIN, TIBC, IRON, RETICCTPCT in the last 72 hours. Urine analysis:    Component Value Date/Time   COLORURINE YELLOW 02/11/2021 0725   APPEARANCEUR CLEAR 02/11/2021 0725   LABSPEC 1.024 02/11/2021 0725   PHURINE 5.0 02/11/2021 0725   GLUCOSEU NEGATIVE 02/11/2021 0725   GLUCOSEU 500 (A) 11/14/2020 1345   HGBUR NEGATIVE 02/11/2021 0725   BILIRUBINUR NEGATIVE 02/11/2021 0725   KETONESUR NEGATIVE 02/11/2021 0725   PROTEINUR NEGATIVE 02/11/2021 0725   UROBILINOGEN 0.2 11/14/2020 1345   NITRITE NEGATIVE 02/11/2021 0725   LEUKOCYTESUR  NEGATIVE 02/11/2021 0725   Sepsis Labs: @LABRCNTIP (procalcitonin:4,lacticidven:4)  ) Recent Results (from the past 240 hour(s))  Culture, blood (routine x 2)     Status: None   Collection Time: 02/11/21  6:38 AM   Specimen: BLOOD  Result Value Ref Range Status   Specimen Description   Final    BLOOD LEFT ANTECUBITAL Performed at [facility], [address] [place], [place] [address]    Special Requests   Final    BOTTLES DRAWN AEROBIC AND ANAEROBIC Blood Culture adequate volume Performed at [facility], [address] [place], [place] [address]    Culture   Final    NO GROWTH 5 DAYS Performed at [facility], [place] [address], [place], [place] [address]    Report Status 02/16/2021 FINAL  Final  Culture, blood (routine x 2)     Status: None   Collection Time: 02/11/21  6:41 AM   Specimen: BLOOD LEFT ARM  Result Value Ref Range Status   Specimen Description   Final    BLOOD LEFT ARM Performed at [facility], [address] [place], [place] [address]    Special Requests   Final    BOTTLES DRAWN AEROBIC AND ANAEROBIC Blood Culture adequate volume Performed at [facility], [address] [place], [place] [address]    Culture   Final    NO GROWTH 5 DAYS Performed at [facility], [place] [address], [place], [place] [address]    Report Status 02/16/2021 FINAL  Final      Radiology Studies: No results found.   Scheduled Meds: .  stroke: mapping our early stages of recovery book   Does not apply Once  . amiodarone  400 mg Per Tube Daily   Followed by  . [START ON 02/28/2021] amiodarone  200 mg Per Tube Daily  . amLODipine  10 mg Per Tube Daily  . carvedilol  25 mg Per Tube BID WC  . chlorhexidine  15 mL Mouth Rinse BID  . enoxaparin (LOVENOX) injection  40 mg Subcutaneous Q24H  . free water  100 mL Per Tube Q4H  . gabapentin  100 mg Per Tube TID  . insulin aspart  3-9 Units Subcutaneous Q4H  . insulin detemir   25 Units Subcutaneous BID  . losartan  50 mg Per Tube BID  . mouth rinse  15 mL Mouth Rinse q12n4p  . nutrition  supplement (JUVEN)  1 packet Per Tube BID BM  . pantoprazole sodium  40 mg Per Tube QHS  . senna-docusate  1 tablet Per Tube BID   Continuous Infusions: . feeding supplement (GLUCERNA 1.5 CAL) 1,000 mL (02/17/21 1637)     LOS: 26 days   Kristien Salatino MD  02/18/2021 at 8:33 AM  [facility] Office  [phone]

## 2021-02-19 LAB — GLUCOSE, CAPILLARY
Glucose-Capillary: 105 mg/dL — ABNORMAL HIGH (ref 70–99)
Glucose-Capillary: 90 mg/dL (ref 70–99)
Glucose-Capillary: 141 mg/dL — ABNORMAL HIGH (ref 70–99)
Glucose-Capillary: 147 mg/dL — ABNORMAL HIGH (ref 70–99)
Glucose-Capillary: 107 mg/dL — ABNORMAL HIGH (ref 70–99)
Glucose-Capillary: 110 mg/dL — ABNORMAL HIGH (ref 70–99)

## 2021-02-19 NOTE — Progress Notes (Addendum)
PROGRESS NOTE    COLUM COLT  QQI:[id number] DOB: 09-Jul-1936 DOA: 01/23/2021 PCP: [name], MD   Brief Narrative:   Brian [name] is a 85 y.o. male with PMH significant for history of atrial fibrillation on Eliquis, diabetes mellitus type 2, hyperlipidemia, hypertension, obesity, prior left MCA stroke who presented to hospital with acute right-sided weakness facial droop and right-sided numbness with aphasia and left gaze preference.  He had global aphasia to present with.  Patient was noted to have left thalamic intracranial hemorrhage with a 6 mm left to right shift on the CT scan.  Eliquis was reversed with andexanet alfa and was admitted to hospital for further evaluation and treatment.   During hospitalization, patient also required cleviprex infusion for hypertensive emergency through 01/26/2021.  He was admitted to neuro ICU under stroke service.  Patient developed fever and lethargy on 01/26/21.  Patient also failed a swallow evaluation and cortrak tube tube was placed in.  Subsequently, patient underwent PEG tube placement on 02/08/2021.  He continued to have persistent low-grade fever but leukocytosis trended down.  EEG showed focal left sided slowing but no seizures.  Repeat CT scan showed evolving intracranial hemorrhage.  Neurology had an impression of poor prognosis.  Patient was then transferred with ICU and hospitalist resumed care on 02/01/2021.  At this time, plan is disposition to skilled nursing facility.    Assessment & Plan   Intracerebral hemorrhage with cerebral edema and midline shift. Patient initially received reversal agent for anticoagulant reversal.  There was mild midline shift from left thalamus intracranial hemorrhage.  Repeat CT scan showed stability.  Neurology saw the patient but did not recommend any surgical intervention.  Conservative management was pursued.  Patient will follow-up with neurology in 4 to 6 weeks.  Continue statins.  2D echocardiogram showed  left-ventricular ejection fraction of 55 to 60%.  Patient was seen by physical therapy, occupational therapy who recommended skilled nursing facility placement.  Fever/cough Chest x-Elijan unremarkable, UA unremarkable, procalcitonin 0.2.  No indication for antibiotics. latest T-max of 98.6).  Hypertensive emergency On presentation.  Improved at this time.  Initially needed Cleviprex infusion.  SBP goal is less than 160.  Continue Coreg, losartan, amlodipine.  On as needed labetalol and hydralazine as well.    Sustained atrial tachycardia Cardiology was consulted.  Continue amiodarone and Coreg.    History of atrial fibrillation Anticoagulation on hold due to intracranial hemorrhage.  Continue amiodarone and Coreg.  Possible aspiration pneumonia Unable to do spirometry.  Completed 7-day course of Unasyn. Continue aspiration precautions.  Dysphagia Secondary to stroke.  Status post PEG tube placement on 02/08/2021.  Continue aspiration precautions.  Seen by speech therapy on 02/17/2021 and recommendation is to continue PEG tube.  Continue tube feeds at this time.  IR has followed the PEG tube site and has considered that it is well-functioning.  Diabetes mellitus, type II -Continue Levemir, insulin sliding scale and CBG monitoring POC glucose of 91.  Acute anemia-normocytic/hypochromic anemia. Latest hemoglobin of 10.3.  No evidence of bleeding.  Continue to monitor.  DVT Prophylaxis  SCDs  Code Status: Full  Family Communication:  Spoke with the patient's son and updated him about the clinical condition of the patient on 02/17/2021.     Disposition Plan:  Status is: Inpatient  Remains inpatient appropriate because:Inpatient level of care appropriate due to severity of illness   Dispo: The patient is from: Home  Anticipated d/c is to: SNF as per PT recommendation.  Patient has been declined for skilled nursing facility.  Transition of care on board for appropriate  disposition.              Patient currently is not  medically stable to d/c.   Difficult to place patient No  Consultants Neurology Cardiology Interventional radiology  Procedures  2D echocardiogram Carotid ultrasound Gastrostomy tube placement 02/08/21  Antibiotics   None at present  Subjective:   Today, patient was seen and examined at bedside.  Not much interval changes.  Nonverbal.  Objective:   Vitals:   02/19/21 0022 02/19/21 0411 02/19/21 0419 02/19/21 0727  BP: 136/70 (!) 152/73  (!) 144/74  Pulse: 61 64  66  Resp: 18 18  17   Temp: 98.7 F (37.1 C) 98.6 F (37 C)  98.7 F (37.1 C)  TempSrc: Oral Oral  Oral  SpO2: 100% 96%    Weight:   102 kg   Height:        Intake/Output Summary (Last 24 hours) at 02/19/2021 1259 Last data filed at 02/19/2021 7989 Gross per 24 hour  Intake --  Output 1800 ml  Net -1800 ml   Filed Weights   02/17/21 0500 02/18/21 0500 02/19/21 0419  Weight: 100.5 kg 101.7 kg 102 kg   Physical exam  General: Obese built, not in obvious distress, nonverbal, moves left extremity, HENT:   No scleral pallor or icterus noted. Oral mucosa is moist.  Chest:  Clear breath sounds.  Diminished breath sounds bilaterally. No crackles or wheezes.  CVS: S1 &S2 heard. No murmur.  Regular rate and rhythm. Abdomen: Soft, nontender, nondistended.  Bowel sounds are heard.   Extremities: No cyanosis, clubbing or edema.  Peripheral pulses are palpable.  Left upper extremity on mittens. Psych: Alert, awake and non verbal normal mood CNS: Nonverbal, moves left extremities, right-sided weakness Skin: Warm and dry.  No rashes noted.  Blisters over the right posterior thigh.   Data Reviewed: I have personally reviewed following labs and imaging studies  CBC: Recent Labs  Lab 02/13/21 0357 02/14/21 0247 02/17/21 0610  WBC 7.9 6.5 5.8  HGB 8.7* 8.7* 10.3*  HCT 29.2* 29.3* 35.5*  MCV 86.9 85.9 87.7  PLT 287 301 211   Basic Metabolic Panel: Recent  Labs  Lab 02/14/21 0247 02/16/21 0434 02/17/21 0610 02/18/21 0419  NA 136  --  137  --   K 4.5  --  4.7  --   CL 100  --  102  --   CO2 28  --  27  --   GLUCOSE 167*  --  126*  --   BUN 46*  --  41*  --   CREATININE 1.09  --  1.08  --   CALCIUM 9.3  --  9.5  --   MG  --  2.5* 2.4 2.5*  PHOS  --  4.3 4.2 4.3   GFR: Estimated Creatinine Clearance: 64.1 mL/min (by C-G formula based on SCr of 1.08 mg/dL). Liver Function Tests: No results for input(s): AST, ALT, ALKPHOS, BILITOT, PROT, ALBUMIN in the last 168 hours. No results for input(s): LIPASE, AMYLASE in the last 168 hours. No results for input(s): AMMONIA in the last 168 hours. Coagulation Profile: No results for input(s): INR, PROTIME in the last 168 hours. Cardiac Enzymes: No results for input(s): CKTOTAL, CKMB, CKMBINDEX, TROPONINI in the last 168 hours. BNP (last 3 results) No results for input(s): PROBNP in the  last 8760 hours. HbA1C: No results for input(s): HGBA1C in the last 72 hours. CBG: Recent Labs  Lab 02/18/21 1921 02/18/21 2311 02/19/21 0307 02/19/21 0758 02/19/21 1212  GLUCAP 112* 94 105* 90 141*   Lipid Profile: No results for input(s): CHOL, HDL, LDLCALC, TRIG, CHOLHDL, LDLDIRECT in the last 72 hours. Thyroid Function Tests: No results for input(s): TSH, T4TOTAL, FREET4, T3FREE, THYROIDAB in the last 72 hours. Anemia Panel: No results for input(s): VITAMINB12, FOLATE, FERRITIN, TIBC, IRON, RETICCTPCT in the last 72 hours. Urine analysis:    Component Value Date/Time   COLORURINE YELLOW 02/11/2021 0725   APPEARANCEUR CLEAR 02/11/2021 0725   LABSPEC 1.024 02/11/2021 0725   PHURINE 5.0 02/11/2021 0725   GLUCOSEU NEGATIVE 02/11/2021 0725   GLUCOSEU 500 (A) 11/14/2020 1345   HGBUR NEGATIVE 02/11/2021 0725   BILIRUBINUR NEGATIVE 02/11/2021 0725   KETONESUR NEGATIVE 02/11/2021 0725   PROTEINUR NEGATIVE 02/11/2021 0725   UROBILINOGEN 0.2 11/14/2020 1345   NITRITE NEGATIVE 02/11/2021 0725    LEUKOCYTESUR NEGATIVE 02/11/2021 0725   Sepsis Labs: @LABRCNTIP (procalcitonin:4,lacticidven:4)  ) Recent Results (from the past 240 hour(s))  Culture, blood (routine x 2)     Status: None   Collection Time: 02/11/21  6:38 AM   Specimen: BLOOD  Result Value Ref Range Status   Specimen Description   Final    BLOOD LEFT ANTECUBITAL Performed at [facility], [address] [place], [place] [address]    Special Requests   Final    BOTTLES DRAWN AEROBIC AND ANAEROBIC Blood Culture adequate volume Performed at [facility], [address] [place], [place] [address]    Culture   Final    NO GROWTH 5 DAYS Performed at [facility], [place] [address], [place], [place] [address]    Report Status 02/16/2021 FINAL  Final  Culture, blood (routine x 2)     Status: None   Collection Time: 02/11/21  6:41 AM   Specimen: BLOOD LEFT ARM  Result Value Ref Range Status   Specimen Description   Final    BLOOD LEFT ARM Performed at [facility], [address] [place], [place] [address]    Special Requests   Final    BOTTLES DRAWN AEROBIC AND ANAEROBIC Blood Culture adequate volume Performed at [facility], [address] [place], [place] [address]    Culture   Final    NO GROWTH 5 DAYS Performed at [facility], [place] [address], [place], [place] [address]    Report Status 02/16/2021 FINAL  Final      Radiology Studies: No results found.   Scheduled Meds: .  stroke: mapping our early stages of recovery book   Does not apply Once  . amiodarone  400 mg Per Tube Daily   Followed by  . [START ON 02/28/2021] amiodarone  200 mg Per Tube Daily  . amLODipine  10 mg Per Tube Daily  . carvedilol  25 mg Per Tube BID WC  . chlorhexidine  15 mL Mouth Rinse BID  . enoxaparin (LOVENOX) injection  40 mg Subcutaneous Q24H  . free water  100 mL Per Tube Q4H  . gabapentin  100 mg Per Tube TID  . insulin aspart  3-9 Units Subcutaneous Q4H  .  insulin detemir  25 Units Subcutaneous BID  . losartan  50 mg Per Tube BID  . mouth rinse  15 mL Mouth Rinse q12n4p  . nutrition  supplement (JUVEN)  1 packet Per Tube BID BM  . pantoprazole sodium  40 mg Per Tube QHS  . senna-docusate  1 tablet Per Tube BID   Continuous Infusions: . feeding supplement (GLUCERNA 1.5 CAL) 1,000 mL (02/19/21 0419)     LOS: 27 days   Emrys Mckamie MD  02/19/2021 at 12:59 PM  [facility] Office  [phone]

## 2021-02-19 NOTE — Plan of Care (Signed)
  Problem: Health Behavior/Discharge Planning: Goal: Ability to manage health-related needs will improve Outcome: Progressing   Problem: Clinical Measurements: Goal: Ability to maintain clinical measurements within normal limits will improve Outcome: Progressing Goal: Will remain free from infection Outcome: Progressing Goal: Diagnostic test results will improve Outcome: Progressing Goal: Respiratory complications will improve Outcome: Progressing Goal: Cardiovascular complication will be avoided Outcome: Progressing   Problem: Nutrition: Goal: Adequate nutrition will be maintained Outcome: Progressing   Problem: Education: Goal: Knowledge of disease or condition will improve Outcome: Progressing Goal: Knowledge of secondary prevention will improve Outcome: Progressing Goal: Knowledge of patient specific risk factors addressed and post discharge goals established will improve Outcome: Progressing   Problem: Intracerebral Hemorrhage Tissue Perfusion: Goal: Complications of Intracerebral Hemorrhage will be minimized Outcome: Progressing   Problem: Education: Goal: Knowledge of disease or condition will improve Outcome: Progressing Goal: Knowledge of secondary prevention will improve Outcome: Progressing Goal: Knowledge of patient specific risk factors addressed and post discharge goals established will improve Outcome: Progressing Goal: Individualized Educational Video(s) Outcome: Progressing   Problem: Health Behavior/Discharge Planning: Goal: Ability to manage health-related needs will improve Outcome: Progressing   Problem: Self-Care: Goal: Ability to participate in self-care as condition permits will improve Outcome: Progressing   Problem: Nutrition: Goal: Risk of aspiration will decrease Outcome: Progressing Goal: Dietary intake will improve Outcome: Progressing   Problem: Intracerebral Hemorrhage Tissue Perfusion: Goal: Complications of Intracerebral  Hemorrhage will be minimized Outcome: Progressing

## 2021-02-20 LAB — GLUCOSE, CAPILLARY
Glucose-Capillary: 112 mg/dL — ABNORMAL HIGH (ref 70–99)
Glucose-Capillary: 116 mg/dL — ABNORMAL HIGH (ref 70–99)
Glucose-Capillary: 119 mg/dL — ABNORMAL HIGH (ref 70–99)
Glucose-Capillary: 136 mg/dL — ABNORMAL HIGH (ref 70–99)
Glucose-Capillary: 145 mg/dL — ABNORMAL HIGH (ref 70–99)
Glucose-Capillary: 91 mg/dL (ref 70–99)

## 2021-02-20 LAB — CBC
HCT: 35.1 % — ABNORMAL LOW (ref 39.0–52.0)
Hemoglobin: 10.1 g/dL — ABNORMAL LOW (ref 13.0–17.0)
MCH: 25.6 pg — ABNORMAL LOW (ref 26.0–34.0)
MCHC: 28.8 g/dL — ABNORMAL LOW (ref 30.0–36.0)
MCV: 89.1 fL (ref 80.0–100.0)
Platelets: 322 10*3/uL (ref 150–400)
RBC: 3.94 MIL/uL — ABNORMAL LOW (ref 4.22–5.81)
RDW: 17.5 % — ABNORMAL HIGH (ref 11.5–15.5)
WBC: 5.2 10*3/uL (ref 4.0–10.5)
nRBC: 3.1 % — ABNORMAL HIGH (ref 0.0–0.2)

## 2021-02-20 LAB — BASIC METABOLIC PANEL
Anion gap: 8 (ref 5–15)
BUN: 42 mg/dL — ABNORMAL HIGH (ref 8–23)
CO2: 25 mmol/L (ref 22–32)
Calcium: 9.4 mg/dL (ref 8.9–10.3)
Chloride: 105 mmol/L (ref 98–111)
Creatinine, Ser: 1.03 mg/dL (ref 0.61–1.24)
GFR, Estimated: >60 mL/min (ref >60)
Glucose, Bld: 106 mg/dL — ABNORMAL HIGH (ref 70–99)
Potassium: 5.4 mmol/L — ABNORMAL HIGH (ref 3.5–5.1)
Sodium: 138 mmol/L (ref 135–145)

## 2021-02-20 NOTE — Progress Notes (Signed)
PROGRESS NOTE    KERIC [name]  SEL:[id number] DOB: 18-Apr-1936 DOA: 01/23/2021 PCP: [name], MD   Brief Narrative:   Brian [name] is a 85 y.o. male with PMH significant for history of atrial fibrillation on Eliquis, diabetes mellitus type 2, hyperlipidemia, hypertension, obesity, prior left MCA stroke who presented to hospital with acute right-sided weakness facial droop and right-sided numbness with aphasia and left gaze preference.  He had global aphasia to present with.  Patient was noted to have left thalamic intracranial hemorrhage with a 6 mm left to right shift on the CT scan.  Eliquis was reversed with andexanet alfa and was admitted to hospital for further evaluation and treatment.   During hospitalization, patient also required cleviprex infusion for hypertensive emergency through 01/26/2021.  He was admitted to neuro ICU under stroke service.  Patient developed fever and lethargy on 01/26/21.  Patient also failed a swallow evaluation and cortrak tube tube was placed in.  Subsequently, patient underwent PEG tube placement on 02/08/2021.  He continued to have persistent low-grade fever but leukocytosis trended down.  EEG showed focal left sided slowing but no seizures.  Repeat CT scan showed evolving intracranial hemorrhage.  Neurology had an impression of poor prognosis.  Patient was then transferred with ICU and hospitalist resumed care on 02/01/2021.  At this time, plan is disposition to skilled nursing facility.    Assessment & Plan   Intracerebral hemorrhage with cerebral edema and midline shift. Patient initially received reversal agent for anticoagulant reversal.  There was mild midline shift from left thalamus intracranial hemorrhage.  Repeat CT scan showed stability.  Neurology saw the patient but did not recommend any surgical intervention.  Conservative management was pursued.  Patient will follow-up with neurology in 4 to 6 weeks.  Continue statins.  2D echocardiogram showed  left-ventricular ejection fraction of 55 to 60%.  Patient was seen by physical therapy, occupational therapy who recommended skilled nursing facility placement.  Fever/cough Improved.  Chest x-Konnor unremarkable, UA unremarkable, procalcitonin 0.2.  No indication for antibiotics.  Hypertensive emergency On presentation.  Improved at this time.  Initially needed Cleviprex infusion.  SBP goal is less than 160.  Continue Coreg, losartan, amlodipine.  On as needed labetalol and hydralazine as well.  Blood pressure of 149/69  Sustained atrial tachycardia Cardiology was consulted.  Controlled heart rate at this time.  Continue amiodarone and Coreg.    History of atrial fibrillation Anticoagulation on hold due to intracranial hemorrhage.  Continue amiodarone and Coreg.  Controlled at this time.  Possible aspiration pneumonia   Completed 7-day course of Unasyn.Continue aspiration precautions.  Dysphagia Secondary to stroke.  Status post PEG tube placement on 02/08/2021.  Continue aspiration precautions.  Seen by speech therapy on 02/17/2021 and recommendation is to continue PEG tube.  Continue tube feeds at this time.  She has been tolerating feedings.    Diabetes mellitus, type II -Continue Levemir, insulin sliding scale and CBG monitoring POC glucose of 91.  Acute anemia-normocytic/hypochromic anemia. Latest hemoglobin of 10.1.  No evidence of bleeding.  Continue to monitor.  DVT Prophylaxis  SCDs  Code Status: Full  Family Communication:  Spoke with the patient's son and updated him about the clinical condition of the patient on 02/17/2021.     Disposition Plan:  Status is: Inpatient  Remains inpatient appropriate because:Inpatient level of care appropriate due to severity of illness   Dispo: The patient is from: Home  Anticipated d/c is to: SNF as per PT recommendation.  Patient has been declined for skilled nursing facility.  Transition of care on board for appropriate  disposition.              Patient currently is not  medically stable to d/c.   Difficult to place patient yes  Consultants Neurology Cardiology Interventional radiology  Procedures  2D echocardiogram Carotid ultrasound Gastrostomy tube placement 02/08/21  Antibiotics   None at present  Subjective:   Today, patient was seen and examined at bedside.  No interval changes reported to me.  Nonverbal. Objective:   Vitals:   02/19/21 2323 02/20/21 0318 02/20/21 0408 02/20/21 0913  BP: (!) 149/74 125/66  (!) 149/69  Pulse: 61 (!) 53  60  Resp: 16 16  16   Temp: 98.9 F (37.2 C) 98.6 F (37 C)  98.3 F (36.8 C)  TempSrc: Oral Oral  Oral  SpO2: 99% 95%  96%  Weight:   100.4 kg   Height:        Intake/Output Summary (Last 24 hours) at 02/20/2021 1110 Last data filed at 02/20/2021 0759 Gross per 24 hour  Intake 7148.92 ml  Output 2050 ml  Net 5098.92 ml   Filed Weights   02/18/21 0500 02/19/21 0419 02/20/21 0408  Weight: 101.7 kg 102 kg 100.4 kg   Physical exam  General: Obese built, not in obvious distress, nonverbal, moves left extremity, HENT:   No scleral pallor or icterus noted. Oral mucosa is moist.  Chest:  Clear breath sounds.  Diminished breath sounds bilaterally. No crackles or wheezes.  CVS: S1 &S2 heard. No murmur.  Regular rate and rhythm. Abdomen: Soft, nontender, nondistended.  Bowel sounds are heard.   Extremities: No cyanosis, clubbing or edema.  Peripheral pulses are palpable.  Left upper extremity on mittens. Psych: Alert, awake but nonverbal., normal mood CNS: Nonverbal, moves left extremities, right-sided weakness Skin: Warm and dry.  No rashes noted.  Blisters over the right posterior thigh.   Data Reviewed: I have personally reviewed following labs and imaging studies  CBC: Recent Labs  Lab 02/14/21 0247 02/17/21 0610 02/20/21 0530  WBC 6.5 5.8 5.2  HGB 8.7* 10.3* 10.1*  HCT 29.3* 35.5* 35.1*  MCV 85.9 87.7 89.1  PLT 301 306 631   Basic  Metabolic Panel: Recent Labs  Lab 02/14/21 0247 02/16/21 0434 02/17/21 0610 02/18/21 0419 02/20/21 0530  NA 136  --  137  --  138  K 4.5  --  4.7  --  5.4*  CL 100  --  102  --  105  CO2 28  --  27  --  25  GLUCOSE 167*  --  126*  --  106*  BUN 46*  --  41*  --  42*  CREATININE 1.09  --  1.08  --  1.03  CALCIUM 9.3  --  9.5  --  9.4  MG  --  2.5* 2.4 2.5*  --   PHOS  --  4.3 4.2 4.3  --    GFR: Estimated Creatinine Clearance: 66.6 mL/min (by C-G formula based on SCr of 1.03 mg/dL). Liver Function Tests: No results for input(s): AST, ALT, ALKPHOS, BILITOT, PROT, ALBUMIN in the last 168 hours. No results for input(s): LIPASE, AMYLASE in the last 168 hours. No results for input(s): AMMONIA in the last 168 hours. Coagulation Profile: No results for input(s): INR, PROTIME in the last 168 hours. Cardiac Enzymes: No results for input(s): CKTOTAL, CKMB,  CKMBINDEX, TROPONINI in the last 168 hours. BNP (last 3 results) No results for input(s): PROBNP in the last 8760 hours. HbA1C: No results for input(s): HGBA1C in the last 72 hours. CBG: Recent Labs  Lab 02/19/21 1609 02/19/21 1933 02/19/21 2321 02/20/21 0315 02/20/21 0940  GLUCAP 147* 107* 110* 145* 91   Lipid Profile: No results for input(s): CHOL, HDL, LDLCALC, TRIG, CHOLHDL, LDLDIRECT in the last 72 hours. Thyroid Function Tests: No results for input(s): TSH, T4TOTAL, FREET4, T3FREE, THYROIDAB in the last 72 hours. Anemia Panel: No results for input(s): VITAMINB12, FOLATE, FERRITIN, TIBC, IRON, RETICCTPCT in the last 72 hours. Urine analysis:    Component Value Date/Time   COLORURINE YELLOW 02/11/2021 0725   APPEARANCEUR CLEAR 02/11/2021 0725   LABSPEC 1.024 02/11/2021 0725   PHURINE 5.0 02/11/2021 0725   GLUCOSEU NEGATIVE 02/11/2021 0725   GLUCOSEU 500 (A) 11/14/2020 1345   HGBUR NEGATIVE 02/11/2021 0725   BILIRUBINUR NEGATIVE 02/11/2021 0725   KETONESUR NEGATIVE 02/11/2021 0725   PROTEINUR NEGATIVE 02/11/2021  0725   UROBILINOGEN 0.2 11/14/2020 1345   NITRITE NEGATIVE 02/11/2021 0725   LEUKOCYTESUR NEGATIVE 02/11/2021 0725   Sepsis Labs: @LABRCNTIP (procalcitonin:4,lacticidven:4)  ) Recent Results (from the past 240 hour(s))  Culture, blood (routine x 2)     Status: None   Collection Time: 02/11/21  6:38 AM   Specimen: BLOOD  Result Value Ref Range Status   Specimen Description   Final    BLOOD LEFT ANTECUBITAL Performed at [facility], [address] [place], [place] [address]    Special Requests   Final    BOTTLES DRAWN AEROBIC AND ANAEROBIC Blood Culture adequate volume Performed at [facility], [address] [place], [place] [address]    Culture   Final    NO GROWTH 5 DAYS Performed at [facility], [place] [address] [place], [place] [address]    Report Status 02/16/2021 FINAL  Final  Culture, blood (routine x 2)     Status: None   Collection Time: 02/11/21  6:41 AM   Specimen: BLOOD LEFT ARM  Result Value Ref Range Status   Specimen Description   Final    BLOOD LEFT ARM Performed at [facility], [address] [place], [place] [address]    Special Requests   Final    BOTTLES DRAWN AEROBIC AND ANAEROBIC Blood Culture adequate volume Performed at [facility], [address] [place], [place] [address]    Culture   Final    NO GROWTH 5 DAYS Performed at [facility], [place] [address] [place],  [address]    Report Status 02/16/2021 FINAL  Final      Radiology Studies: No results found.   Scheduled Meds: .  stroke: mapping our early stages of recovery book   Does not apply Once  . amiodarone  400 mg Per Tube Daily   Followed by  . [START ON 02/28/2021] amiodarone  200 mg Per Tube Daily  . amLODipine  10 mg Per Tube Daily  . carvedilol  25 mg Per Tube BID WC  . chlorhexidine  15 mL Mouth Rinse BID  . enoxaparin (LOVENOX) injection  40 mg Subcutaneous Q24H  . free water  100 mL Per Tube Q4H  .  gabapentin  100 mg Per Tube TID  . insulin aspart  3-9 Units Subcutaneous Q4H  . insulin detemir  25 Units Subcutaneous BID  . losartan  50 mg Per Tube BID  . mouth rinse  15 mL Mouth Rinse q12n4p  . nutrition supplement (JUVEN)  1 packet Per Tube BID BM  . pantoprazole sodium  40 mg Per Tube QHS  . senna-docusate  1 tablet Per Tube BID   Continuous Infusions: . feeding supplement (GLUCERNA 1.5 CAL) 65 mL/hr at 02/20/21 0759     LOS: 28 days   Isaid Salvia MD  02/20/2021 at 11:10 AM  [facility] Office  [phone]

## 2021-02-20 NOTE — Progress Notes (Signed)
Physical Therapy Treatment Patient Details Name: Brian [name] MRN: [id number] DOB: May 31, 1936 Today's Date: 02/20/2021    History of Present Illness 85 y.o. M who presents with acute onset R sided weakness, facial droop, numbness, aphasia with left gaze preference. CTH showing L thalamic ICH.  S/P peg tube 5/25.Significant PMH: afib, DM2, HLD, HTN, stroke.    PT Comments    Pt resting upon PT arrival to room, wakes with repeated PT verbalizations. Pt requiring total assist for all bed mobility tasks today, worsening by pt resistance to mobility with LUE at times. Pt with strong pushing behaviors towards R once sitting EOB, and requires max assist to maintain upright sitting. PT to continue to follow acutely, and will progress mobility as tolerated.    Follow Up Recommendations  SNF     Equipment Recommendations  Wheelchair (measurements PT);Wheelchair cushion (measurements PT);Hospital bed;3in1 (PT)    Recommendations for Other Services       Precautions / Restrictions Precautions Precautions: Fall Precaution Comments: R hemiparesis, global aphasia, PEG Restrictions Weight Bearing Restrictions: No    Mobility  Bed Mobility Overal bed mobility: Needs Assistance Bed Mobility: Supine to Sit;Sit to Supine;Rolling Rolling: Total assist   Supine to sit: Total assist Sit to supine: Total assist   General bed mobility comments: total assist for rolling bilat for all aspects after pt had BM, pt more resistant to roll R. Total assist for supine<>sit for trunk and LE assist, boost up in bed, and maintaining upright at EOB.    Transfers                 General transfer comment: uanble  Ambulation/Gait                 Stairs             Wheelchair Mobility    Modified Rankin (Stroke Patients Only) Modified Rankin (Stroke Patients Only) Pre-Morbid Rankin Score: Slight disability Modified Rankin: Severe disability     Balance Overall balance  assessment: Needs assistance Sitting-balance support: Feet supported;Single extremity supported Sitting balance-Leahy Scale: Zero Sitting balance - Comments: max assist to support trunk, pt with LUE pushing towards R, HOB elevated to keep pt from pushing too far over and cues for palcement LUE in lap. Toleraetd EOB sitting x2 mintues only Postural control: Posterior lean;Right lateral lean                                  Cognition Arousal/Alertness: Lethargic Behavior During Therapy: Flat affect Overall Cognitive Status: Difficult to assess                                 General Comments: Pt moaning during rolling, resistant to some mobility (PT taking LUE mitt off, PT holding L hand for moving to/from EOB). Pt verbalizes "mmhmm" only during session. Follows very little commands, does bend his L knee and open eyes to command.      Exercises General Exercises - Lower Extremity Heel Slides: PROM;Right;5 reps;Supine Shoulder Exercises Elbow Flexion: PROM;Right;5 reps;Supine Elbow Extension: PROM;Right;5 reps;Supine    General Comments        Pertinent Vitals/Pain Pain Assessment: Faces Faces Pain Scale: Hurts a little bit Pain Location: grimacing during rolling R>L Pain Intervention(s): Monitored during session;Repositioned    Home Living  Prior Function            PT Goals (current goals can now be found in the care plan section) Acute Rehab PT Goals Patient Stated Goal: none stated PT Goal Formulation: Patient unable to participate in goal setting Time For Goal Achievement: 03/06/21 Potential to Achieve Goals: Fair Progress towards PT goals: Progressing toward goals    Frequency    Min 2X/week      PT Plan Current plan remains appropriate    Co-evaluation              AM-PAC PT "6 Clicks" Mobility   Outcome Measure  Help needed turning from your back to your side while in a flat bed without  using bedrails?: Total Help needed moving from lying on your back to sitting on the side of a flat bed without using bedrails?: Total Help needed moving to and from a bed to a chair (including a wheelchair)?: Total Help needed standing up from a chair using your arms (e.g., wheelchair or bedside chair)?: Total Help needed to walk in hospital room?: Total Help needed climbing 3-5 steps with a railing? : Total 6 Click Score: 6    End of Session   Activity Tolerance: Patient limited by fatigue;Patient limited by pain;Other (comment) (resistance to mobility) Patient left: in bed;with call bell/phone within reach;with bed alarm set Nurse Communication: Mobility status PT Visit Diagnosis: Other abnormalities of gait and mobility (R26.89);Hemiplegia and hemiparesis Hemiplegia - Right/Left: Right Hemiplegia - dominant/non-dominant: Dominant Hemiplegia - caused by: Nontraumatic intracerebral hemorrhage     Time: 1245-8099 PT Time Calculation (min) (ACUTE ONLY): 20 min  Charges:  $Therapeutic Activity: 8-22 mins                     Stacie Glaze, PT DPT Acute Rehabilitation Services Pager [phone]  Office [phone]   Roxine Caddy E Ruffin Pyo 02/20/2021, 3:51 PM

## 2021-02-20 NOTE — Plan of Care (Signed)
  Problem: Clinical Measurements: Goal: Ability to maintain clinical measurements within normal limits will improve Outcome: Progressing Goal: Will remain free from infection Outcome: Progressing Goal: Diagnostic test results will improve Outcome: Progressing Goal: Respiratory complications will improve Outcome: Progressing Goal: Cardiovascular complication will be avoided Outcome: Progressing   Problem: Nutrition: Goal: Adequate nutrition will be maintained Outcome: Progressing   Problem: Intracerebral Hemorrhage Tissue Perfusion: Goal: Complications of Intracerebral Hemorrhage will be minimized Outcome: Progressing   Problem: Education: Goal: Individualized Educational Video(s) Outcome: Progressing   Problem: Nutrition: Goal: Risk of aspiration will decrease Outcome: Progressing Goal: Dietary intake will improve Outcome: Progressing   Problem: Intracerebral Hemorrhage Tissue Perfusion: Goal: Complications of Intracerebral Hemorrhage will be minimized Outcome: Progressing

## 2021-02-21 DIAGNOSIS — Z7189 Other specified counseling: Secondary | ICD-10-CM

## 2021-02-21 DIAGNOSIS — Z515 Encounter for palliative care: Secondary | ICD-10-CM

## 2021-02-21 LAB — BASIC METABOLIC PANEL
Anion gap: 6 (ref 5–15)
BUN: 42 mg/dL — ABNORMAL HIGH (ref 8–23)
CO2: 28 mmol/L (ref 22–32)
Calcium: 9.4 mg/dL (ref 8.9–10.3)
Chloride: 104 mmol/L (ref 98–111)
Creatinine, Ser: 1 mg/dL (ref 0.61–1.24)
GFR, Estimated: >60 mL/min (ref >60)
Glucose, Bld: 110 mg/dL — ABNORMAL HIGH (ref 70–99)
Potassium: 4.5 mmol/L (ref 3.5–5.1)
Sodium: 138 mmol/L (ref 135–145)

## 2021-02-21 LAB — GLUCOSE, CAPILLARY
Glucose-Capillary: 112 mg/dL — ABNORMAL HIGH (ref 70–99)
Glucose-Capillary: 122 mg/dL — ABNORMAL HIGH (ref 70–99)
Glucose-Capillary: 129 mg/dL — ABNORMAL HIGH (ref 70–99)
Glucose-Capillary: 141 mg/dL — ABNORMAL HIGH (ref 70–99)
Glucose-Capillary: 91 mg/dL (ref 70–99)
Glucose-Capillary: 97 mg/dL (ref 70–99)

## 2021-02-21 NOTE — TOC Progression Note (Signed)
Transition of Care [facility]) - Progression Note    Patient Details  Name: Brian [name] MRN: [id number] Date of Birth: [birth year]  Transition of Care [facility] CM/SW [place], [place] Phone Number: 02/21/2021, 11:54 AM  Clinical Narrative:   CSW met with patient's son, [name], and daughter in law at bedside to discuss barriers to SNF placement. CSW explained that patient's insurance was denying the auth request for SNF, and so patient will either have to apply for Medicaid or private pay. Patient's son reported that patient is likely not going to qualify for Medicaid due to his assets, and so CSW suggested reaching out to an elder Engineer, mining for assistance in spending down assets appropriately. CSW also discussed with patient's son private pay costs for SNF and provided him with the name and number for admissions at [facility] to discuss details. CSW then later received update from daughter in law that they may be interested in moving the patient to [place] or [place] where his children are, she provided information on cities to look near. CSW to continue to follow.    Expected Discharge Plan: [place] Barriers to Discharge: Insurance Authorization,Continued Medical Work up  Expected Discharge Plan and Services Expected Discharge Plan: [place] In-house Referral: Clinical Social Work   Post Acute Care Choice: [place] Living arrangements for the past 2 months: Single Family Home                                       Social Determinants of Health (SDOH) Interventions    Readmission Risk Interventions No flowsheet data found.

## 2021-02-21 NOTE — Plan of Care (Signed)
  Problem: Health Behavior/Discharge Planning: Goal: Ability to manage health-related needs will improve Outcome: Progressing   Problem: Clinical Measurements: Goal: Ability to maintain clinical measurements within normal limits will improve Outcome: Progressing Goal: Will remain free from infection Outcome: Progressing Goal: Diagnostic test results will improve Outcome: Progressing Goal: Respiratory complications will improve Outcome: Progressing Goal: Cardiovascular complication will be avoided Outcome: Progressing   Problem: Nutrition: Goal: Adequate nutrition will be maintained Outcome: Progressing   Problem: Education: Goal: Knowledge of disease or condition will improve Outcome: Progressing Goal: Knowledge of secondary prevention will improve Outcome: Progressing Goal: Knowledge of patient specific risk factors addressed and post discharge goals established will improve Outcome: Progressing   Problem: Intracerebral Hemorrhage Tissue Perfusion: Goal: Complications of Intracerebral Hemorrhage will be minimized Outcome: Progressing   Problem: Education: Goal: Knowledge of disease or condition will improve Outcome: Progressing Goal: Knowledge of secondary prevention will improve Outcome: Progressing Goal: Knowledge of patient specific risk factors addressed and post discharge goals established will improve Outcome: Progressing Goal: Individualized Educational Video(s) Outcome: Progressing   Problem: Education: Goal: Knowledge of disease or condition will improve Outcome: Progressing Goal: Knowledge of secondary prevention will improve Outcome: Progressing Goal: Knowledge of patient specific risk factors addressed and post discharge goals established will improve Outcome: Progressing Goal: Individualized Educational Video(s) Outcome: Progressing   Problem: Health Behavior/Discharge Planning: Goal: Ability to manage health-related needs will improve Outcome:  Progressing   Problem: Self-Care: Goal: Ability to participate in self-care as condition permits will improve Outcome: Progressing   Problem: Nutrition: Goal: Risk of aspiration will decrease Outcome: Progressing Goal: Dietary intake will improve Outcome: Progressing   Problem: Intracerebral Hemorrhage Tissue Perfusion: Goal: Complications of Intracerebral Hemorrhage will be minimized Outcome: Progressing

## 2021-02-21 NOTE — Progress Notes (Addendum)
Nutrition Follow-up  DOCUMENTATION CODES:  Not applicable  INTERVENTION:  Continue tube feeding via PEG: Glucerna 1.5 at 65 ml/h (1560 ml per day) 124m free water Q4H (per MD)  Provides 2340 kcal, 128 gm protein, 1184 ml free water daily (17963mtotal free water with flushes)   -1 packet Juven BID, each packet provides 95 calories, 2.5 grams of protein (collagen), and 9.8 grams of carbohydrate (3 grams sugar); also contains 7 grams of L-arginine and L-glutamine, 300 mg vitamin C, 15 mg vitamin E, 1.2 mcg vitamin B-12, 9.5 mg zinc, 200 mg calcium, and 1.5 g  Calcium Beta-hydroxy-Beta-methylbutyrate to support wound healing  NUTRITION DIAGNOSIS:  Inadequate oral intake related to inability to eat as evidenced by NPO status. Ongoing.   GOAL:  Patient will meet greater than or equal to 90% of their needs Met.   MONITOR:  TF tolerance,Diet advancement  REASON FOR ASSESSMENT:  Consult Enteral/tube feeding initiation and management  ASSESSMENT:  Pt with PMH of Afib, DM, HLD, HTN, obesity, prior L MCA now admitted with ICH and associated cerebral edema in setting of hypertensive emergency and Apixaban therapy for Afib.   5/11 cortak placed, tip gastric 5/13 worsening lethargy started on 3% 5/14 developed Afib, followed by cardiology 5/18 tx to hospitalist service  5/25 s/p PEG placement 5/30 IR evaluated PEG due to some drainage from PEG site, tube found to be in satisfactory position with no extraluminal contrast to suggest extravasation   Pt has been declined for SNF. Transition of care on board for appropriate disposition.   Pt remains nonverbal/noninteractive. Pt seen by speech therapy on 02/17/2021 and recommendation is to continue PEG tube/TF at this time.  Discussed pt with RN who reports pt tolerating current TF regimen via PEG. Current regimen: Glucerna 1.5 @ 6548mr w/ juven BID and 100m67mee water Q4H.   UOP: 1950ml15mumented today 1x unmeasured stool occurrences x24  hours  Medications: SSI, levemir, protonix, senokot-s, juven BID Labs reviewed. CBGs: [id number] Order:   Diet Order            Diet NPO time specified  Diet effective midnight                EDUCATION NEEDS:  No education needs have been identified at this time  Skin:  Skin Assessment: Skin Integrity Issues: Skin Integrity Issues:: Stage II Stage II: L buttocks  Last BM:  6/6  Height:  Ht Readings from Last 1 Encounters:  01/23/21 6' (1.829 m)   Weight:  Wt Readings from Last 1 Encounters:  02/21/21 101.6 kg   Ideal Body Weight:  80.9 kg  BMI:  Body mass index is 30.38 kg/m.  Estimated Nutritional Needs:  Kcal:  2200-2400 Protein:  115-130 grams Fluid:  > 2 L/day   AmandLarkin Ina RD, LDN RD pager number and weekend/on-call pager number located in [place]

## 2021-02-21 NOTE — Progress Notes (Signed)
Manufacturing engineer [facility]) Hospital Liaison note.    Received request from [place] for family interest in [facility] and information on hospice.  Spoke with son and his wife to explain IPU services and answer questions. Family requested time to think about their options.   Please do not hesitate to call with questions.   Thank you,   Farrel Gordon, RN, [facility] Hospital Liaison   [phone]

## 2021-02-21 NOTE — Consult Note (Signed)
Palliative Medicine Inpatient Consult Note  Reason for consult:  "goals of care, family interested in possible hospice"  HPI:  Per intake H&P --> Brian K Flowersis a 85 y.o.malewith PMH significant for history of atrial fibrillation on Eliquis, diabetes mellitus type 2, hyperlipidemia, hypertension, obesity, prior left MCA stroke who presented to hospital with acute right-sided weakness facial droop and right-sided numbness with aphasia and left gaze preference.  He had global aphasia to present with.  Patient was noted to have left thalamic intracranial hemorrhage with a 6 mm left to right shift on the CT scan.  Eliquis was reversed with andexanet alfa and was admitted to hospital for further evaluation and treatment.   Palliative care has been asked to the discussed the option of hospice with patient's family based upon their expressed interest.  Clinical Assessment/Goals of Care:  *Please note that this is a verbal dictation therefore any spelling or grammatical errors are due to the "[place]" system interpretation.  I have reviewed medical records including EPIC notes, labs and imaging, received report from bedside RN, assessed the patient who is lying in bed in no acute distress.    I called patient's daughter, Brian [name] and son, Brian [name] to further discuss diagnosis prognosis, Brian [name], EOL wishes, disposition and options.   I introduced Palliative Medicine as specialized medical care for people living with serious illness. It focuses on providing relief from the symptoms and stress of a serious illness. The goal is to improve quality of life for both the patient and the family.  Brian [name] is originally from [place], [place].  He is a widower.  He has 2 children.  Both of his children live out of state, his daughter Brian [name] lives in [place] and his son, Brian [name] lives in [place].  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  He is  identified as a strong and stubborn man.  He is a man of faith and practices within the [employer] den[employer].  Prior to hospitalization Brian [name] was living independently and fully able to complete all basic activities of daily living and her mental activities of daily living without any assistance.  He had a large church community and brothers who live locally.  I reviewed patient's past medical history inclusive of trial fibrillation on Eliquis, diabetes mellitus type 2, hyperlipidemia, hypertension, obesity, prior left MCA stroke.  Discussed that all of these things in combination make recovery from another acute stroke all the more difficult.  A detailed discussion was had today regarding advanced directives -Brian [name] had not completed these though his daughter, Brian [name] is the eldest of her two children.    Concepts specific to code status, artifical feeding and hydration, continued IV antibiotics and rehospitalization was had.  Emailed "hard choices for loving people" book for Brian [name] and Brian [name] to review.  Shared that a cardiopulmonary arrest would likely carry with it tremendous physical burden with little to no benefit given raise already severe stroke.  Brian [name] presses a variety of frustrations that Brian [name] has not been placed at a facility yet.  She shares that had he had been placed earlier they would already have a better idea of what his long-term prognosis would look like.  I provided support through empathetic listening.  Discuss concepts associated with hospice which are to provide dignity and quality at the end of the patient's life.  Reviewed that hospice is reserved for patients who have a terminal diagnosis with  a less than 64-month prognosis of life to live.  I shared that in cases where our goals are geared more towards comfort and alleviation of symptom burden we typically do not continue high-volume gastric tube feedings.  I shared that if we are truly considering end-of-life  care in an inpatient hospice setting continuation of gastrostomy tube feeding is considered a life prolonging measure.  Patient's family understand this and share with me that they would not be ready to withhold tube feeding from Brian [name] at this point in time.  Did concepts and values associated with outpatient palliative care.  This is something that will be recommended upon discharge.  Reviewed that we should have ongoing conversations regarding CODE STATUS in the oncoming days given the importance of these decisions.  Patient's son, Brian [name] is thankful for this information and does inquire further about what hospice would look like in the setting of gastrostomy tube feeding.  He is asked if he could speak with one of the hospice liaison personally to get better clarity.  I shared with him that I can request our social worker reach out for further information.  Discussed the importance of continued conversation with family and their  medical providers regarding overall plan of care and treatment options, ensuring decisions are within the context of the patients values and GOCs.  Decision Maker: Brian [name] Brian [name] (daughter) - [phone]  SUMMARY OF RECOMMENDATIONS   Full Code / Full scope of care -patient's family plan to review the hard choices for loving people book and have ongoing conversations regarding CODE STATUS  Patient is extremely spiritual would benefit from ongoing prayer -his own congregational members have been visiting him frequently  Appreciate hospice team reaching out to patient's family to further explain what care modalities could and could not take place in inpatient hospice -this is purely informational  I will not be present tomorrow though I will request one of my colleagues reach out to Brian [name]'s family for further conversations  Code Status/Advance Care Planning: FULL CODE   Palliative Prophylaxis:   Care, mobility  Additional Recommendations (Limitations, Scope,  Preferences):  Continue current scope of care  Psycho-social/Spiritual:   Desire for further Chaplaincy support:  No  Additional Recommendations:  Acacian on chronic disease processes   Prognosis:  Prognosis is poor in the setting of a rather large stroke and chronic comorbid conditions with increase in frailty.  Very high 47-month mortality risk  Discharge Planning:  Discharge plan is unclear at this time  Vitals:   02/21/21 0736 02/21/21 1149  BP: (!) 148/69 (!) 114/53  Pulse: 60 (!) 52  Resp: 17 16  Temp: 98.9 F (37.2 C) 98.7 F (37.1 C)  SpO2: 100% 98%    Intake/Output Summary (Last 24 hours) at 02/21/2021 1305 Last data filed at 02/21/2021 1025 Gross per 24 hour  Intake 651.08 ml  Output 1550 ml  Net -898.92 ml   Last Weight  Most recent update: 02/21/2021  4:05 AM   Weight  101.6 kg (223 lb 15.8 oz)            Gen: [name] African-American male in no acute distress HEENT: Dry mucous membranes CV: Regular rate and irregular rhythm PULM: cOn 2LPM [place] ABD: G-tube EXT: No edema Neuro: Somnolent  PPS: 10%   This conversation/these recommendations were discussed with patient primary care team, Dr. Louanne Belton  Time In: 1250 Time Out: 1400 Total Time: 70 Greater than 50%  of this time was spent counseling and coordinating care related to  the above assessment and plan.  [facility] Team Team Cell Phone: [phone] Please utilize secure chat with additional questions, if there is no response within 30 minutes please call the above phone number  Palliative Medicine Team providers are available by phone from 7am to 7pm daily and can be reached through the team cell phone.  Should this patient require assistance outside of these hours, please call the patient's attending physician.

## 2021-02-21 NOTE — Progress Notes (Addendum)
PROGRESS NOTE    Brian [name]  RDE:[id number] DOB: 1936-07-18 DOA: 01/23/2021 PCP: [name], MD   Brief Narrative:   Brian [name] is a 85 y.o. male with PMH significant for history of atrial fibrillation on Eliquis, diabetes mellitus type 2, hyperlipidemia, hypertension, obesity, prior left MCA stroke who presented to hospital with acute right-sided weakness facial droop and right-sided numbness with aphasia and left gaze preference.  He had global aphasia to present with.  Patient was noted to have left thalamic intracranial hemorrhage with a 6 mm left to right shift on the CT scan.  Eliquis was reversed with andexanet alfa and was admitted to hospital for further evaluation and treatment.   During hospitalization, patient also required cleviprex infusion for hypertensive emergency through 01/26/2021.  He was admitted to neuro ICU under stroke service.  Patient developed fever and lethargy on 01/26/21.  Patient also failed a swallow evaluation and cortrak tube tube was placed in.  Subsequently, patient underwent PEG tube placement on 02/08/2021.  He continued to have persistent low-grade fever but leukocytosis trended down.  EEG showed focal left sided slowing but no seizures.  Repeat CT scan showed evolving intracranial hemorrhage.  Neurology had an impression of poor prognosis.  Patient was then transferred with ICU and hospitalist resumed care on 02/01/2021.  At this time, plan is disposition to skilled nursing facility.    Assessment & Plan   Intracerebral hemorrhage with cerebral edema and midline shift. Patient initially received reversal agent for anticoagulant reversal.  There was mild midline shift from left thalamus intracranial hemorrhage.  Repeat CT scan showed stability.  Neurology saw the patient but did not recommend any surgical intervention.  Conservative management was pursued.  Patient will follow-up with neurology in 4 to 6 weeks.  Continue statins.  2D echocardiogram showed  left-ventricular ejection fraction of 55 to 60%.  Patient was seen by physical therapy, occupational therapy who recommended skilled nursing facility placement.  Fever/cough Improved.  Chest x-Nijee unremarkable, UA unremarkable, procalcitonin 0.2.  No indication for antibiotics.  Hypertensive emergency On presentation.  Improved at this time.  Initially needed Cleviprex infusion.  SBP goal is less than 160.  Continue Coreg, losartan, amlodipine.  On as needed labetalol and hydralazine as well.  Blood pressure of 149/69  Sustained atrial tachycardia Cardiology was consulted.  Controlled heart rate at this time.  Continue amiodarone and Coreg.    History of atrial fibrillation Anticoagulation on hold due to intracranial hemorrhage.  Continue amiodarone and Coreg.  Controlled at this time.  Possible aspiration pneumonia   Completed 7-day course of Unasyn. Continue aspiration precautions.  Dysphagia Secondary to stroke.  Status post PEG tube placement on 02/08/2021.  Continue aspiration precautions.  Seen by speech therapy on 02/17/2021 and recommendation is to continue PEG tube.  Continue tube feeds at this time.  She has been tolerating feedings.    Diabetes mellitus, type II -Continue Levemir, insulin sliding scale and CBG monitoring POC glucose of 91.  Acute anemia-normocytic/hypochromic anemia. Latest hemoglobin of 10.1.  No evidence of bleeding.  Continue to monitor.  Pressure injury.  Left buttocks stage II.  Documented on 02/05/2021.  Started with a blister.  Unclear whether it was present on admission or not.  Continue wound care.  Pressure Injury 02/05/21 Buttocks Left Stage 2 -  Partial thickness loss of dermis presenting as a shallow open injury with a red, pink wound bed without slough. blister ruptured on left buttock, serosanguinous drainage (Active)  02/05/21  1613  Location: Buttocks  Location Orientation: Left  Staging: Stage 2 -  Partial thickness loss of dermis presenting as  a shallow open injury with a red, pink wound bed without slough.  Wound Description (Comments): blister ruptured on left buttock, serosanguinous drainage  Present on Admission: No       DVT Prophylaxis  SCDs  Code Status: Full  Family Communication:  Spoke with the patient's son and updated him about the clinical condition of the patient on 02/21/2021.     Disposition Plan:  Status is: Inpatient  Remains inpatient appropriate because:Inpatient level of care appropriate due to severity of illness   Dispo: The patient is from: Home              Anticipated d/c is to: SNF as per PT recommendation.  Patient has been declined for skilled nursing facility.  Transition of care on board for appropriate disposition.              Patient currently is medically stable to d/c.   Difficult to place patient; yes  Consultants Neurology Cardiology Interventional radiology  Procedures  2D echocardiogram Carotid ultrasound Gastrostomy tube placement 02/08/21  Antibiotics   None at present  Subjective:   Today, patient was seen and examined at bedside.  No interval changes reported.  Still nonverbal.  Objective:   Vitals:   02/20/21 2342 02/21/21 0310 02/21/21 0404 02/21/21 0736  BP: (!) 126/57 (!) 158/78  (!) 148/69  Pulse: (!) 52 64  60  Resp: 16 16  17   Temp: 98.4 F (36.9 C) 98.1 F (36.7 C)  98.9 F (37.2 C)  TempSrc: Oral Oral  Oral  SpO2: 98% 98%  100%  Weight:   101.6 kg   Height:        Intake/Output Summary (Last 24 hours) at 02/21/2021 1113 Last data filed at 02/21/2021 0432 Gross per 24 hour  Intake 651.08 ml  Output 1550 ml  Net -898.92 ml   Filed Weights   02/19/21 0419 02/20/21 0408 02/21/21 0404  Weight: 102 kg 100.4 kg 101.6 kg   Physical exam  General: Obese built,, not in obvious distress, nonverbal, moves left extremity. HENT:   No scleral pallor or icterus noted. Oral mucosa is moist.  Chest:  Clear breath sounds.  Diminished breath sounds  bilaterally. No crackles or wheezes.  CVS: S1 &S2 heard. No murmur.  Regular rate and rhythm. Abdomen: Soft, nontender, nondistended.  Bowel sounds are heard.   Extremities: No cyanosis, clubbing or edema.  Peripheral pulses are palpable.  Left upper extremity on mittens.  Right-sided weakness. Psych: Alert, awake but nonverbal, CNS: Nonverbal, moves left upper extremity and lower extremity, right-sided weakness. Skin: Warm and dry.     Data Reviewed: I have personally reviewed following labs and imaging studies  CBC: Recent Labs  Lab 02/17/21 0610 02/20/21 0530  WBC 5.8 5.2  HGB 10.3* 10.1*  HCT 35.5* 35.1*  MCV 87.7 89.1  PLT 306 784   Basic Metabolic Panel: Recent Labs  Lab 02/16/21 0434 02/17/21 0610 02/18/21 0419 02/20/21 0530 02/21/21 0521  NA  --  137  --  138 138  K  --  4.7  --  5.4* 4.5  CL  --  102  --  105 104  CO2  --  27  --  25 28  GLUCOSE  --  126*  --  106* 110*  BUN  --  41*  --  42* 42*  CREATININE  --  1.08  --  1.03 1.00  CALCIUM  --  9.5  --  9.4 9.4  MG 2.5* 2.4 2.5*  --   --   PHOS 4.3 4.2 4.3  --   --    GFR: Estimated Creatinine Clearance: 69 mL/min (by C-G formula based on SCr of 1 mg/dL). Liver Function Tests: No results for input(s): AST, ALT, ALKPHOS, BILITOT, PROT, ALBUMIN in the last 168 hours. No results for input(s): LIPASE, AMYLASE in the last 168 hours. No results for input(s): AMMONIA in the last 168 hours. Coagulation Profile: No results for input(s): INR, PROTIME in the last 168 hours. Cardiac Enzymes: No results for input(s): CKTOTAL, CKMB, CKMBINDEX, TROPONINI in the last 168 hours. BNP (last 3 results) No results for input(s): PROBNP in the last 8760 hours. HbA1C: No results for input(s): HGBA1C in the last 72 hours. CBG: Recent Labs  Lab 02/20/21 1629 02/20/21 1939 02/20/21 2354 02/21/21 0403 02/21/21 0736  GLUCAP 116* 112* 119* 122* 91   Lipid Profile: No results for input(s): CHOL, HDL, LDLCALC, TRIG,  CHOLHDL, LDLDIRECT in the last 72 hours. Thyroid Function Tests: No results for input(s): TSH, T4TOTAL, FREET4, T3FREE, THYROIDAB in the last 72 hours. Anemia Panel: No results for input(s): VITAMINB12, FOLATE, FERRITIN, TIBC, IRON, RETICCTPCT in the last 72 hours. Urine analysis:    Component Value Date/Time   COLORURINE YELLOW 02/11/2021 0725   APPEARANCEUR CLEAR 02/11/2021 0725   LABSPEC 1.024 02/11/2021 0725   PHURINE 5.0 02/11/2021 0725   GLUCOSEU NEGATIVE 02/11/2021 0725   GLUCOSEU 500 (A) 11/14/2020 1345   HGBUR NEGATIVE 02/11/2021 0725   BILIRUBINUR NEGATIVE 02/11/2021 0725   KETONESUR NEGATIVE 02/11/2021 0725   PROTEINUR NEGATIVE 02/11/2021 0725   UROBILINOGEN 0.2 11/14/2020 1345   NITRITE NEGATIVE 02/11/2021 0725   LEUKOCYTESUR NEGATIVE 02/11/2021 0725   Sepsis Labs: @LABRCNTIP (procalcitonin:4,lacticidven:4)  ) No results found for this or any previous visit (from the past 240 hour(s)).    Radiology Studies: No results found.   Scheduled Meds:   stroke: mapping our early stages of recovery book   Does not apply Once   amiodarone  400 mg Per Tube Daily   Followed by   Derrill Memo ON 02/28/2021] amiodarone  200 mg Per Tube Daily   amLODipine  10 mg Per Tube Daily   carvedilol  25 mg Per Tube BID WC   chlorhexidine  15 mL Mouth Rinse BID   enoxaparin (LOVENOX) injection  40 mg Subcutaneous Q24H   free water  100 mL Per Tube Q4H   gabapentin  100 mg Per Tube TID   insulin aspart  3-9 Units Subcutaneous Q4H   insulin detemir  25 Units Subcutaneous BID   losartan  50 mg Per Tube BID   mouth rinse  15 mL Mouth Rinse q12n4p   nutrition supplement (JUVEN)  1 packet Per Tube BID BM   pantoprazole sodium  40 mg Per Tube QHS   senna-docusate  1 tablet Per Tube BID   Continuous Infusions:  feeding supplement (GLUCERNA 1.5 CAL) 1,000 mL (02/21/21 0436)     LOS: 29 days   Anwar Sakata MD  02/21/2021 at [facility] Office  [phone]

## 2021-02-21 NOTE — TOC Progression Note (Addendum)
Transition of Care [facility]) - Progression Note    Patient Details  Name: Brian [name] MRN: [id number] Date of Birth: 1936-09-02  Transition of Care [facility]) CM/SW [place], [place] Phone Number: 02/21/2021, 11:57 AM  Clinical Narrative:   CSW received updated message from patient's daughter in law asking if hospice was looked into for the patient, as that would be a lower cost of care if the patient qualified. CSW confirmed that the family would like to discuss hospice, and updated MD. MD to consult palliative to discuss goals of care with the family. CSW to continue to follow.  UPDATE 4:13 PM: CSW received update from palliative that family did not seem fully on board with hospice at this time, but they would like to receive information and discuss with hospice liaison. CSW sent referral to [facility], and liaison reached out to discuss with family and provide hospice information. Family is considering multiple options for what to pursue for the patient at this time, CSW will follow back up with them tomorrow to discuss.    Expected Discharge Plan: [place] Barriers to Discharge: Insurance Authorization,Continued Medical Work up  Expected Discharge Plan and Services Expected Discharge Plan: [place] In-house Referral: Clinical Social Work   Post Acute Care Choice: [place] Living arrangements for the past 2 months: Single Family Home                                       Social Determinants of Health (SDOH) Interventions    Readmission Risk Interventions No flowsheet data found.

## 2021-02-22 LAB — GLUCOSE, CAPILLARY
Glucose-Capillary: 111 mg/dL — ABNORMAL HIGH (ref 70–99)
Glucose-Capillary: 140 mg/dL — ABNORMAL HIGH (ref 70–99)
Glucose-Capillary: 143 mg/dL — ABNORMAL HIGH (ref 70–99)
Glucose-Capillary: 161 mg/dL — ABNORMAL HIGH (ref 70–99)
Glucose-Capillary: 163 mg/dL — ABNORMAL HIGH (ref 70–99)
Glucose-Capillary: 53 mg/dL — ABNORMAL LOW (ref 70–99)
Glucose-Capillary: 78 mg/dL (ref 70–99)
Glucose-Capillary: 97 mg/dL (ref 70–99)

## 2021-02-22 MED ORDER — HYDRALAZINE HCL 20 MG/ML IJ SOLN
10.0000 mg | Freq: Four times a day (QID) | INTRAMUSCULAR | Status: DC | PRN
  Administered 2021-02-22: 10 mg via INTRAVENOUS
  Filled 2021-02-22: qty 1

## 2021-02-22 NOTE — Progress Notes (Signed)
Pt's BP elevated to 171/75, MD paged regarding the BP.

## 2021-02-22 NOTE — Plan of Care (Signed)
  Problem: Health Behavior/Discharge Planning: Goal: Ability to manage health-related needs will improve Outcome: Progressing   Problem: Clinical Measurements: Goal: Ability to maintain clinical measurements within normal limits will improve Outcome: Progressing Goal: Will remain free from infection Outcome: Progressing Goal: Diagnostic test results will improve Outcome: Progressing Goal: Respiratory complications will improve Outcome: Progressing Goal: Cardiovascular complication will be avoided Outcome: Progressing   Problem: Nutrition: Goal: Adequate nutrition will be maintained Outcome: Progressing   Problem: Education: Goal: Knowledge of disease or condition will improve Outcome: Progressing Goal: Knowledge of secondary prevention will improve Outcome: Progressing Goal: Knowledge of patient specific risk factors addressed and post discharge goals established will improve Outcome: Progressing   Problem: Intracerebral Hemorrhage Tissue Perfusion: Goal: Complications of Intracerebral Hemorrhage will be minimized Outcome: Progressing   Problem: Education: Goal: Knowledge of disease or condition will improve Outcome: Progressing Goal: Knowledge of secondary prevention will improve Outcome: Progressing Goal: Knowledge of patient specific risk factors addressed and post discharge goals established will improve Outcome: Progressing Goal: Individualized Educational Video(s) Outcome: Progressing   Problem: Health Behavior/Discharge Planning: Goal: Ability to manage health-related needs will improve Outcome: Progressing   Problem: Self-Care: Goal: Ability to participate in self-care as condition permits will improve Outcome: Progressing   Problem: Nutrition: Goal: Risk of aspiration will decrease Outcome: Progressing Goal: Dietary intake will improve Outcome: Progressing   Problem: Intracerebral Hemorrhage Tissue Perfusion: Goal: Complications of Intracerebral  Hemorrhage will be minimized Outcome: Progressing

## 2021-02-22 NOTE — TOC Progression Note (Signed)
Transition of Care [facility]) - Progression Note    Patient Details  Name: Brian [name] MRN: [id number] Date of Birth: 1936-02-13  Transition of Care [facility]) CM/SW [place], [place] Phone Number: 02/22/2021, 10:57 AM  Clinical Narrative:   CSW left a voicemail for patient's son, [name], to check in on the family's thoughts of what to pursue for disposition for the patient. CSW awaiting call back, will follow.    Expected Discharge Plan: [place] Barriers to Discharge: Insurance Authorization,Continued Medical Work up  Expected Discharge Plan and Services Expected Discharge Plan: [place] In-house Referral: Clinical Social Work   Post Acute Care Choice: [place] Living arrangements for the past 2 months: Single Family Home                                       Social Determinants of Health (SDOH) Interventions    Readmission Risk Interventions No flowsheet data found.

## 2021-02-22 NOTE — Progress Notes (Signed)
PROGRESS NOTE    Brian [name]  XLK:[id number] DOB: 28-Feb-1936 DOA: 01/23/2021 PCP: [name], MD   Brief Narrative:   Brian [name] is a 85 y.o. male with PMH significant for history of atrial fibrillation on Eliquis, diabetes mellitus type 2, hyperlipidemia, hypertension, obesity, prior left MCA stroke who presented to hospital with acute right-sided weakness facial droop and right-sided numbness with aphasia and left gaze preference.  He had global aphasia to present with.  Patient was noted to have left thalamic intracranial hemorrhage with a 6 mm left to right shift on the CT scan.  Eliquis was reversed with andexanet alfa and was admitted to hospital for further evaluation and treatment.   During hospitalization, patient also required cleviprex infusion for hypertensive emergency through 01/26/2021.  He was admitted to neuro ICU under stroke service.  Patient developed fever and lethargy on 01/26/21.  Patient also failed a swallow evaluation and cortrak tube tube was placed in.  Subsequently, patient underwent PEG tube placement on 02/08/2021.  He continued to have persistent low-grade fever but leukocytosis trended down.  EEG showed focal left sided slowing but no seizures.  Repeat CT scan showed evolving intracranial hemorrhage.  Neurology had an impression of poor prognosis.  Patient was then transferred with ICU and hospitalist resumed care on 02/01/2021.  At this time, plan is disposition to skilled nursing facility.    Assessment & Plan   Intracerebral hemorrhage with cerebral edema and midline shift. Patient initially received reversal agent for anticoagulant reversal.  There was mild midline shift from left thalamus intracranial hemorrhage.  Repeat CT scan showed stability.  2D echocardiogram showed left-ventricular ejection fraction of 55 to 60%.Neurology saw the patient but did not recommend any surgical intervention.  Conservative management was pursued.  Patient will follow-up with  neurology in 4 to 6 weeks.  Continue statins.    Patient was seen by physical therapy, occupational therapy who recommended skilled nursing facility placement.  Currently stable but weakness has not improved.  Fever/cough  Improved.  Chest x-Emmanuel unremarkable, UA unremarkable, procalcitonin 0.2.  No indication for antibiotics.  Latest temperature of 97.7 F.  Hypertensive emergency On presentation.  Improved at this time.  Initially needed Cleviprex infusion.  SBP goal is less than 160.  Continue Coreg, losartan, amlodipine.  On as needed labetalol and hydralazine as well.  Latest blood pressure of 151/66  Sustained atrial tachycardia Improved.  Continue amiodarone and Coreg.    History of atrial fibrillation Anticoagulation on hold due to intracranial hemorrhage.  Continue amiodarone and Coreg.  Controlled at this time.  Possible aspiration pneumonia   Completed 7-day course of Unasyn. Continue aspiration precautions.  Dysphagia Secondary to stroke.  Status post PEG tube placement on 02/08/2021.  Continue aspiration precautions.  Seen by speech therapy on 02/17/2021 and recommendation is to continue PEG tube.  Continue tube feeds at this time.  She has been tolerating feedings.    Diabetes mellitus, type II -Continue Levemir, insulin sliding scale and CBG monitoring POC glucose of 140.  Acute anemia-normocytic/hypochromic anemia. Latest hemoglobin of 10.1.  No evidence of bleeding.  Continue to monitor.  Pressure injury.  Left buttocks stage II.  Documented on 02/05/2021.  Started with a blister.  Unclear whether it was present on admission or not.  Continue wound care.  Pressure Injury 02/05/21 Buttocks Left Stage 2 -  Partial thickness loss of dermis presenting as a shallow open injury with a red, pink wound bed without slough. blister ruptured on  left buttock, serosanguinous drainage (Active)  02/05/21 1613  Location: Buttocks  Location Orientation: Left  Staging: Stage 2 -  Partial  thickness loss of dermis presenting as a shallow open injury with a red, pink wound bed without slough.  Wound Description (Comments): blister ruptured on left buttock, serosanguinous drainage  Present on Admission: No      DVT Prophylaxis  SCDs  Code Status: Full  Family Communication:  None today, spoke with the son on 6/ 7.  Disposition Plan:  Status is: Inpatient  Remains inpatient appropriate because:Inpatient level of care appropriate due to severity of illness need for rehabilitation   Dispo: The patient is from: Home              Anticipated d/c is to: SNF as per PT recommendation.  Transition of care on board for appropriate disposition.              Patient currently is medically stable to d/c.   Difficult to place patient; yes  Consultants Neurology Cardiology Interventional radiology  Procedures  2D echocardiogram Carotid ultrasound Gastrostomy tube placement 02/08/21  Antibiotics   None at present  Subjective:   Today, patient was seen and examined at bedside.  No interval complaints reported.  Squeezes hands. Objective:   Vitals:   02/22/21 0326 02/22/21 0336 02/22/21 0444 02/22/21 0841  BP: (!) 177/78 (!) 171/78 (!) 146/87 (!) 151/66  Pulse: 64 60 77 67  Resp: 18   20  Temp: 99.2 F (37.3 C)   98.5 F (36.9 C)  TempSrc: Axillary   Oral  SpO2: 100%  96% 99%  Weight:  100.4 kg    Height:        Intake/Output Summary (Last 24 hours) at 02/22/2021 0958 Last data filed at 02/22/2021 0545 Gross per 24 hour  Intake --  Output 1850 ml  Net -1850 ml   Filed Weights   02/20/21 0408 02/21/21 0404 02/22/21 0336  Weight: 100.4 kg 101.6 kg 100.4 kg   Physical exam  General: Obese built,, nonverbal, moves left upper extremity and squeezes hands on command.   HENT:   No scleral pallor or icterus noted. Oral mucosa is moist.  Chest:  Clear breath sounds.  Diminished breath sounds bilaterally. No crackles or wheezes.  CVS: S1 &S2 heard. No murmur.   Regular rate and rhythm. Abdomen: Soft, nontender, nondistended.  Bowel sounds are heard.   Extremities: No cyanosis, clubbing or edema.  Peripheral pulses are palpable.    Right-sided weakness. Psych: Alert, awake but nonverbal, CNS: Nonverbal, moves left extremity, right-sided weakness.   Skin: Warm and dry.     Data Reviewed: I have personally reviewed following labs and imaging studies  CBC: Recent Labs  Lab 02/17/21 0610 02/20/21 0530  WBC 5.8 5.2  HGB 10.3* 10.1*  HCT 35.5* 35.1*  MCV 87.7 89.1  PLT 306 272   Basic Metabolic Panel: Recent Labs  Lab 02/16/21 0434 02/17/21 0610 02/18/21 0419 02/20/21 0530 02/21/21 0521  NA  --  137  --  138 138  K  --  4.7  --  5.4* 4.5  CL  --  102  --  105 104  CO2  --  27  --  25 28  GLUCOSE  --  126*  --  106* 110*  BUN  --  41*  --  42* 42*  CREATININE  --  1.08  --  1.03 1.00  CALCIUM  --  9.5  --  9.4 9.4  MG 2.5* 2.4 2.5*  --   --   PHOS 4.3 4.2 4.3  --   --    GFR: Estimated Creatinine Clearance: 68.6 mL/min (by C-G formula based on SCr of 1 mg/dL). Liver Function Tests: No results for input(s): AST, ALT, ALKPHOS, BILITOT, PROT, ALBUMIN in the last 168 hours. No results for input(s): LIPASE, AMYLASE in the last 168 hours. No results for input(s): AMMONIA in the last 168 hours. Coagulation Profile: No results for input(s): INR, PROTIME in the last 168 hours. Cardiac Enzymes: No results for input(s): CKTOTAL, CKMB, CKMBINDEX, TROPONINI in the last 168 hours. BNP (last 3 results) No results for input(s): PROBNP in the last 8760 hours. HbA1C: No results for input(s): HGBA1C in the last 72 hours. CBG: Recent Labs  Lab 02/21/21 1637 02/21/21 1950 02/21/21 2331 02/22/21 0329 02/22/21 0840  GLUCAP 112* 129* 97 111* 140*   Lipid Profile: No results for input(s): CHOL, HDL, LDLCALC, TRIG, CHOLHDL, LDLDIRECT in the last 72 hours. Thyroid Function Tests: No results for input(s): TSH, T4TOTAL, FREET4, T3FREE,  THYROIDAB in the last 72 hours. Anemia Panel: No results for input(s): VITAMINB12, FOLATE, FERRITIN, TIBC, IRON, RETICCTPCT in the last 72 hours. Urine analysis:    Component Value Date/Time   COLORURINE YELLOW 02/11/2021 0725   APPEARANCEUR CLEAR 02/11/2021 0725   LABSPEC 1.024 02/11/2021 0725   PHURINE 5.0 02/11/2021 0725   GLUCOSEU NEGATIVE 02/11/2021 0725   GLUCOSEU 500 (A) 11/14/2020 1345   HGBUR NEGATIVE 02/11/2021 0725   BILIRUBINUR NEGATIVE 02/11/2021 0725   KETONESUR NEGATIVE 02/11/2021 0725   PROTEINUR NEGATIVE 02/11/2021 0725   UROBILINOGEN 0.2 11/14/2020 1345   NITRITE NEGATIVE 02/11/2021 0725   LEUKOCYTESUR NEGATIVE 02/11/2021 0725   Sepsis Labs: @LABRCNTIP (YJEHUDJSHFWYO:3,ZCHYIFOYDXA:1)  ) No results found for this or any previous visit (from the past 240 hour(s)).    Radiology Studies: No results found.   Scheduled Meds: .  stroke: mapping our early stages of recovery book   Does not apply Once  . amiodarone  400 mg Per Tube Daily   Followed by  . [START ON 02/28/2021] amiodarone  200 mg Per Tube Daily  . amLODipine  10 mg Per Tube Daily  . carvedilol  25 mg Per Tube BID WC  . chlorhexidine  15 mL Mouth Rinse BID  . enoxaparin (LOVENOX) injection  40 mg Subcutaneous Q24H  . free water  100 mL Per Tube Q4H  . gabapentin  100 mg Per Tube TID  . insulin aspart  3-9 Units Subcutaneous Q4H  . insulin detemir  25 Units Subcutaneous BID  . losartan  50 mg Per Tube BID  . mouth rinse  15 mL Mouth Rinse q12n4p  . nutrition supplement (JUVEN)  1 packet Per Tube BID BM  . pantoprazole sodium  40 mg Per Tube QHS  . senna-docusate  1 tablet Per Tube BID   Continuous Infusions: . feeding supplement (GLUCERNA 1.5 CAL) 1,000 mL (02/21/21 2003)     LOS: 30 days   Flora Lipps MD  02/22/2021 at 9:58 AM   [facility] Office  [phone]

## 2021-02-23 LAB — GLUCOSE, CAPILLARY
Glucose-Capillary: 125 mg/dL — ABNORMAL HIGH (ref 70–99)
Glucose-Capillary: 92 mg/dL (ref 70–99)
Glucose-Capillary: 146 mg/dL — ABNORMAL HIGH (ref 70–99)
Glucose-Capillary: 134 mg/dL — ABNORMAL HIGH (ref 70–99)
Glucose-Capillary: 97 mg/dL (ref 70–99)

## 2021-02-23 NOTE — Progress Notes (Signed)
Physical Therapy Treatment Patient Details Name: Brian [name] MRN: [id number] DOB: [birth year] Today's Date: 02/23/2021    History of Present Illness 85 y.o. M who presents with acute onset R sided weakness, facial droop, numbness, aphasia with left gaze preference. CTH showing L thalamic ICH.  S/P peg tube 5/25.Significant PMH: afib, DM2, HLD, HTN, stroke.    PT Comments    PT and OT saw pt together to progress functional mobility as tolerated. Pt still requiring maximal +2 support for all mobility tasks, but is less resistant to mobility tasks today when given max multimodal cuing. Pt also benefits from repeated movements, I.e. rolling, to understand task at hand. Pt sat EOB x10 minutes and participated in seated balance tasks well with posterior and R truncal support. Pt remains appropriate for SNF level of care post-acutely, will continue to follow .   Follow Up Recommendations  SNF     Equipment Recommendations  Wheelchair (measurements PT);Wheelchair cushion (measurements PT);Hospital bed;3in1 (PT)    Recommendations for Other Services       Precautions / Restrictions Precautions Precautions: Fall Precaution Comments: global aphasia, PEG (feeds paused and disconnected during session) Restrictions Weight Bearing Restrictions: No    Mobility  Bed Mobility Overal bed mobility: Needs Assistance Bed Mobility: Supine to Sit;Sit to Supine     Supine to sit: Max assist;+2 for physical assistance Sit to supine: +2 for physical assistance;Max assist   General bed mobility comments: max +2 for truncal and LE management, repeated rolling to L with multimodal cueing for LUE reaching for R railing.    Transfers                 General transfer comment: nt (not attempted)  Ambulation/Gait                 Stairs             Wheelchair Mobility    Modified Rankin (Stroke Patients Only) Modified Rankin (Stroke Patients Only) Pre-Morbid Rankin Score:  Slight disability Modified Rankin: Severe disability     Balance Overall balance assessment: Needs assistance   Sitting balance-Leahy Scale: Poor Sitting balance - Comments: R bias however once trunk rotated toward L, pushing decreased and pt able to sutain balance with min A at times; propping forward on RUE to attempt weight bearing and improve anterior weight shift Postural control: Posterior lean;Right lateral lean                                  Cognition Arousal/Alertness: Awake/alert Behavior During Therapy: Flat affect;Restless Overall Cognitive Status: Impaired/Different from baseline Area of Impairment: Attention;Following commands;Awareness;Problem solving                   Current Attention Level: Sustained   Following Commands: Follows one step commands inconsistently   Awareness: Intellectual Problem Solving: Slow processing;Decreased initiation;Difficulty sequencing;Requires verbal cues;Requires tactile cues        Exercises Shoulder Exercises Elbow Extension: PROM Other Exercises Other Exercises: Seated tasks: bilat elbow propping, mod assist to right balance from L elbow prop to upright and max assist coming for R; reaching/holding cup, reaching for railing on L    General Comments General comments (skin integrity, edema, etc.): RUE spastic hemiparesis; tone decrease RUE with rotation adn protraction of scapula. Developing Elbow flexion contracture; Shoulder ROM limited due to tightness/abnormal shoulder girdle tone      Pertinent Vitals/Pain Pain Assessment: Faces  Faces Pain Scale: Hurts a little bit Pain Location: generalized Pain Descriptors / Indicators: Grimacing;Moaning Pain Intervention(s): Limited activity within patient's tolerance;Monitored during session    Home Living                      Prior Function            PT Goals (current goals can now be found in the care plan section) Acute Rehab PT  Goals Patient Stated Goal: none stated PT Goal Formulation: Patient unable to participate in goal setting Time For Goal Achievement: 03/06/21 Potential to Achieve Goals: Fair Progress towards PT goals: Progressing toward goals    Frequency    Min 2X/week      PT Plan Current plan remains appropriate    Co-evaluation PT/OT/SLP Co-Evaluation/Treatment: Yes Reason for Co-Treatment: For patient/therapist safety;To address functional/ADL transfers;Necessary to address cognition/behavior during functional activity PT goals addressed during session: Mobility/safety with mobility;Balance;Strengthening/ROM OT goals addressed during session: ADL's and self-care;Strengthening/ROM      AM-PAC PT "6 Clicks" Mobility   Outcome Measure  Help needed turning from your back to your side while in a flat bed without using bedrails?: Total Help needed moving from lying on your back to sitting on the side of a flat bed without using bedrails?: Total Help needed moving to and from a bed to a chair (including a wheelchair)?: Total Help needed standing up from a chair using your arms (e.g., wheelchair or bedside chair)?: Total Help needed to walk in hospital room?: Total Help needed climbing 3-5 steps with a railing? : Total 6 Click Score: 6    End of Session   Activity Tolerance: Patient limited by fatigue;Other (comment) (resistance to mobility) Patient left: in bed;with call bell/phone within reach;with bed alarm set;with SCD's reapplied (propped in semi-L sidelying for buttocks pressure relief) Nurse Communication: Mobility status PT Visit Diagnosis: Other abnormalities of gait and mobility (R26.89);Hemiplegia and hemiparesis Hemiplegia - Right/Left: Right Hemiplegia - dominant/non-dominant: Dominant Hemiplegia - caused by: Nontraumatic intracerebral hemorrhage     Time: 8325-4982 PT Time Calculation (min) (ACUTE ONLY): 27 min  Charges:  $Therapeutic Activity: 8-22 mins                      Stacie Glaze, PT DPT Acute Rehabilitation Services Pager [phone]  Office [phone]    [place] 02/23/2021, 12:48 PM

## 2021-02-23 NOTE — Progress Notes (Signed)
  Speech Language Pathology Treatment: Dysphagia  Patient Details Name: Brian [name] MRN: [id number] DOB: 10-Feb-1936 Today's Date: 02/23/2021 Time: 9842-1031 SLP Time Calculation (min) (ACUTE ONLY): 19 min  Assessment / Plan / Recommendation Clinical Impression  Pt demonstrates ongoing defensive behavior during attempt to feed. Today with two people present to manage pts hand he was as able to respond to strawberry ice cream touched to lips with acceptance and eventual eager intake with decreased attempt to push therapist away with hands. Unfortunately he continues to demonstrate signs of asrpiation after several bites, likely due to residue as notes on MBS. Pt still not demonstrating any communicative ability, but he did use a washcloth purposefully x2 during session following multiple hand over hand modeling with appropriate context (messy mouth). Pt still not functionally able to consume a diet. Will continue efforts of more frequent swallowing therapy.   HPI HPI: Pt is a 85 y.o. M who presents with acute onset R sided weakness, facial droop, numbness, aphasia with left gaze preference. CTH showing L thalamic ICH. Significant PMH: afib, DM2, HLD, HTN, stroke. Persistent dysphagia s/p PEG 02/08/21.      SLP Plan  Continue with current plan of care       Recommendations  Diet recommendations: NPO                Plan: Continue with current plan of care       GO               [name], MA [place] Pager [phone] Office [phone]  Lynann Beaver 02/23/2021, 12:48 PM

## 2021-02-23 NOTE — Plan of Care (Signed)
  Problem: Clinical Measurements: Goal: Will remain free from infection Outcome: Progressing Goal: Diagnostic test results will improve Outcome: Progressing Goal: Respiratory complications will improve Outcome: Progressing   Problem: Nutrition: Goal: Adequate nutrition will be maintained Outcome: Progressing   

## 2021-02-23 NOTE — Progress Notes (Signed)
Occupational Therapy Treatment Patient Details Name: Brian [name] MRN: [id number] DOB: Jul 02, 1936 Today's Date: 02/23/2021    History of present illness 85 y.o. M who presents with acute onset R sided weakness, facial droop, numbness, aphasia with left gaze preference. CTH showing L thalamic ICH.  S/P peg tube 5/25.Significant PMH: afib, DM2, HLD, HTN, stroke.   OT comments  Pt seen as cotreat to safely mobilize to EOB and participate in functional tasks. Max A +2 for bed mobility. Able to maintain midline postural control with min A at times when trunk rotated toward L. Pt purposefully pulling gown out from underneath himself to be more comfortable. Pt reaching and picking up objects, but not using functionally. Pt did place cup on table, however unsure if on command. In addition to communication deficit, pt appears to demonstrate significant sensory motor deficits. Appeared comfortable in sitting position. Pt would make eye contact at times. 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  Would assess if pt could tolerate Maximove to recliner and move out of room to different environment to further engage with therapy. Would further attempt activities in supported sitting in chair. Pt pushing/reaching at times with LUE and given pushing, may feel more stable in supported sitting to enable him to actively use LUE. May benefit from use of mirror during session. Continue o recommend rehab at SNF. Will continue to follow acutely.   Follow Up Recommendations  SNF;Supervision/Assistance - 24 hour    Equipment Recommendations  Wheelchair (measurements OT);Wheelchair cushion (measurements OT);Hospital bed    Recommendations for Other Services      Precautions / Restrictions Precautions Precautions: Fall Precaution Comments: global aphasia, PEG Restrictions Weight Bearing Restrictions: No       Mobility Bed Mobility Overal bed mobility: Needs  Assistance Bed Mobility: Supine to Sit;Sit to Supine     Supine to sit: Max assist;+2 for physical assistance Sit to supine: +2 for physical assistance;Max assist   General bed mobility comments: Pt appears to be reaching for rail, holds rail and moving LLE    Transfers                 General transfer comment:  (not attempted)    Balance Overall balance assessment: Needs assistance   Sitting balance-Leahy Scale: Poor Sitting balance - Comments: R bias however once trunk rotated toward L, pushing decreased adn pt able to sutain balance with min A at times; propping forward on RUE to attempt weight bearing and improve anterior weight shift                                   ADL either performed or assessed with clinical judgement   ADL Overall ADL's : Needs assistance/impaired Eating/Feeding: NPO                                     General ADL Comments: Total Assist wtih ADL tasks. Wiping his face spontaneously but would not initiate washing face, rubbing lotion on body with gestures or VC; pulls hand away at hand over hand attempts     Vision   Additional Comments: vision imparied; L gaze preference   Perception     Praxis      Cognition Arousal/Alertness: Awake/alert Behavior During Therapy: Flat affect;Restless Overall Cognitive Status: Impaired/Different from baseline  Area of Impairment: Attention;Following commands;Awareness;Problem solving                   Current Attention Level: Sustained   Following Commands: Follows one step commands inconsistently   Awareness: Intellectual Problem Solving: Slow processing;Decreased initiation;Difficulty sequencing;Requires verbal cues;Requires tactile cues          Exercises Shoulder Exercises Elbow Extension: PROM   Shoulder Instructions       General Comments RUE spastic hemiparesis; tone decrease RUE with rotation adn protraction of scapula. Developing Elbow  flexion contracture; Shoulder ROM limited due to tightness/abnormal shoulder girdle tone    Pertinent Vitals/ Pain       Pain Assessment: Faces Faces Pain Scale: Hurts a little bit Pain Location: grimacing with rotational trunk movement Pain Descriptors / Indicators: Grimacing Pain Intervention(s): Limited activity within patient's tolerance  Home Living                                          Prior Functioning/Environment              Frequency  Min 2X/week        Progress Toward Goals  OT Goals(current goals can now be found in the care plan section)  Progress towards OT goals:  (goals updated; 1 goal met)  Acute Rehab OT Goals Patient Stated Goal: unable to state OT Goal Formulation: Patient unable to participate in goal setting Time For Goal Achievement: 03/09/21 Potential to Achieve Goals: Fair ADL Goals Pt Will Perform Grooming: with max assist;bed level Pt Will Transfer to Toilet:  (DC goal. Not appropriate) Pt/caregiver will Perform Home Exercise Program: Right Upper extremity;Increased ROM;Increased strength;With minimal assist Additional ADL Goal #1:  (goal met 6/9) Additional ADL Goal #2:  (goal DC 6/9) Additional ADL Goal #3: Pt will complete hand to mouth pattern with min A during functional task Additional ADL Goal #4: Pt will maintain midline postural control EOB with min A in preparation for functional tasks  Plan Discharge plan remains appropriate;Frequency remains appropriate    Co-evaluation    PT/OT/SLP Co-Evaluation/Treatment: Yes Reason for Co-Treatment: Necessary to address cognition/behavior during functional activity;For patient/therapist safety;To address functional/ADL transfers   OT goals addressed during session: ADL's and self-care;Strengthening/ROM      AM-PAC OT "6 Clicks" Daily Activity     Outcome Measure   Help from another person eating meals?: Total Help from another person taking care of personal  grooming?: Total Help from another person toileting, which includes using toliet, bedpan, or urinal?: Total Help from another person bathing (including washing, rinsing, drying)?: Total Help from another person to put on and taking off regular upper body clothing?: Total Help from another person to put on and taking off regular lower body clothing?: Total 6 Click Score: 6    End of Session    OT Visit Diagnosis: Unsteadiness on feet (R26.81);Muscle weakness (generalized) (M62.81);Other symptoms and signs involving cognitive function;Hemiplegia and hemiparesis;Apraxia (R48.2);Cognitive communication deficit (R41.841);Pain Symptoms and signs involving cognitive functions: Other Nontraumatic ICH Hemiplegia - Right/Left: Right Hemiplegia - dominant/non-dominant: Dominant Hemiplegia - caused by: Nontraumatic intracerebral hemorrhage Pain - part of body:  (with trunk rotation of hips)   Activity Tolerance Patient tolerated treatment well   Patient Left in bed;with call bell/phone within reach;with bed alarm set;with SCD's reapplied;with restraints reapplied   Nurse Communication Mobility status        Time:  7544-9201 OT Time Calculation (min): 36 min  Charges: OT General Charges $OT Visit: 1 Visit OT Treatments $Neuromuscular Re-education: 8-22 mins  Maurie Boettcher, OT/L   Acute OT Clinical Specialist [place] Pager [phone] Office [phone]    [facility] 02/23/2021, 11:24 AM

## 2021-02-23 NOTE — Progress Notes (Addendum)
CSW attempted to reach patient's son [name] to discuss discharge planning - no answer, a voicemail was left requesting a return call.  CSW spoke with patient's daughter Ennis Forts who states her brother [name] has all the information regarding discharge planning - Ennis Forts to request [name] return call to [place].   Madilyn Fireman, MSW, LCSW Transitions of Care  Clinical Social Worker II [phone]

## 2021-02-23 NOTE — Progress Notes (Signed)
PROGRESS NOTE    [name]  EVO:[id number] DOB: [birth year] DOA: 01/23/2021 PCP: [name], MD   Brief Narrative:   Brian [name] is a 85 y.o. male with PMH significant for history of atrial fibrillation on Eliquis, diabetes mellitus type 2, hyperlipidemia, hypertension, obesity, prior left MCA stroke who presented to hospital with acute right-sided weakness facial droop and right-sided numbness with aphasia and left gaze preference.  He had global aphasia to present with.  Patient was noted to have left thalamic intracranial hemorrhage with a 6 mm left to right shift on the CT scan.  Eliquis was reversed with andexanet alfa and was admitted to hospital for further evaluation and treatment.   During hospitalization, patient also required cleviprex infusion for hypertensive emergency through 01/26/2021.  He was admitted to neuro ICU under stroke service.  Patient developed fever and lethargy on 01/26/21.  Patient also failed a swallow evaluation and cortrak tube tube was placed in.  Subsequently, patient underwent PEG tube placement on 02/08/2021.  He continued to have persistent low-grade fever but leukocytosis trended down.  EEG showed focal left sided slowing but no seizures.  Repeat CT scan showed evolving intracranial hemorrhage.  Neurology had an impression of poor prognosis.  Patient was then transferred with ICU and hospitalist resumed care on 02/01/2021.  At this time, plan is disposition to skilled nursing facility.    Assessment & Plan   Intracerebral hemorrhage with cerebral edema and midline shift. Patient initially received reversal agent for anticoagulant reversal.  There was mild midline shift from left thalamus intracranial hemorrhage.  Repeat CT scan showed stability.  2D echocardiogram showed left-ventricular ejection fraction of 55 to 60%.Neurology saw the patient but did not recommend any surgical intervention.  Conservative management was pursued.  Patient will follow-up with  neurology in 4 to 6 weeks.  Continue statins.    Patient was seen by physical therapy, occupational therapy who recommended skilled nursing facility placement.  Currently stable but weakness has not improved.  Fever/cough  Improved.  Chest x-Pruitt unremarkable, UA unremarkable, procalcitonin 0.2.  No indication for antibiotics.  Latest temperature of 98.6 F.  Hypertensive emergency On presentation.  Improved at this time.  Initially needed Cleviprex infusion.  SBP goal is less than 160.  Continue Coreg, losartan, amlodipine.  On as needed labetalol and hydralazine as well.  Latest blood pressure of 134/77  Sustained atrial tachycardia Improved.  Continue amiodarone and Coreg.    History of atrial fibrillation Anticoagulation on hold due to intracranial hemorrhage.  Continue amiodarone and Coreg.  Controlled at this time.  Possible aspiration pneumonia   Completed 7-day course of Unasyn. Continue aspiration precautions.  Dysphagia Secondary to stroke.  Status post PEG tube placement on 02/08/2021.  Continue aspiration precautions.  Seen by speech therapy on 02/17/2021 and recommendation is to continue PEG tube.  Continue tube feeds at this time.  She has been tolerating feedings.    Diabetes mellitus, type II -Continue Levemir, insulin sliding scale insulin.  Continue blood glucose monitoring.  On tube feeding.  Acute anemia-normocytic/hypochromic anemia. Latest hemoglobin of 10.1.  No evidence of bleeding.  Continue to monitor.  Pressure injury.  Left buttocks stage II.  Documented on 02/05/2021.  Started with a blister.   Continue wound care.  Pressure Injury 02/05/21 Buttocks Left Stage 2 -  Partial thickness loss of dermis presenting as a shallow open injury with a red, pink wound bed without slough. blister ruptured on left buttock, serosanguinous drainage (Active)  02/05/21 1613  Location: Buttocks  Location Orientation: Left  Staging: Stage 2 -  Partial thickness loss of dermis  presenting as a shallow open injury with a red, pink wound bed without slough.  Wound Description (Comments): blister ruptured on left buttock, serosanguinous drainage  Present on Admission: No      DVT Prophylaxis  SCDs  Code Status: Full  Family Communication:  None today, spoke with the son on 6/ 7.  Disposition Plan:  Status is: Inpatient  Remains inpatient appropriate because:Inpatient level of care appropriate due to severity of illness need for rehabilitation   Dispo: The patient is from: Home              Anticipated d/c is to: SNF as per PT recommendation.  Transition of care on board for appropriate disposition.              Patient currently is medically stable to d/c.   Difficult to place patient; yes  Consultants Neurology Cardiology Interventional radiology  Procedures  2D echocardiogram Carotid ultrasound Gastrostomy tube placement 02/08/21  Antibiotics   None at present  Subjective:  Today, patient was seen and examined at bedside.  No interval changes noted.  Nonverbal.  Objective:   Vitals:   02/22/21 1557 02/22/21 2000 02/23/21 0754 02/23/21 1113  BP: (!) 154/73 (!) 150/75 (!) 159/74 134/77  Pulse: 66 68 65 65  Resp: 18 18 19 18   Temp: 98.5 F (36.9 C) 98.1 F (36.7 C) 98.6 F (37 C) 98.1 F (36.7 C)  TempSrc: Oral Axillary Axillary Axillary  SpO2: 98% 100% 98% 96%  Weight:      Height:        Intake/Output Summary (Last 24 hours) at 02/23/2021 1209 Last data filed at 02/22/2021 2252 Gross per 24 hour  Intake --  Output 900 ml  Net -900 ml    Filed Weights   02/20/21 0408 02/21/21 0404 02/22/21 0336  Weight: 100.4 kg 101.6 kg 100.4 kg   Physical exam  General: Obese built, not in obvious distress, nonverbal HENT:   No scleral pallor or icterus noted. Oral mucosa is moist.  Chest:  Clear breath sounds.  Diminished breath sounds bilaterally. No crackles or wheezes.  CVS: S1 &S2 heard. No murmur.  Regular rate and rhythm. Abdomen:  Soft, nontender, nondistended.  Bowel sounds are heard.   Extremities: No cyanosis, clubbing or edema.  Peripheral pulses are palpable. Psych: Alert, awake but nonverbal, CNS: Moves left extremity, right-sided weakness. Skin: Warm and dry.  No rashes noted.   Data Reviewed: I have personally reviewed following labs and imaging studies  CBC: Recent Labs  Lab 02/17/21 0610 02/20/21 0530  WBC 5.8 5.2  HGB 10.3* 10.1*  HCT 35.5* 35.1*  MCV 87.7 89.1  PLT 306 263    Basic Metabolic Panel: Recent Labs  Lab 02/17/21 0610 02/18/21 0419 02/20/21 0530 02/21/21 0521  NA 137  --  138 138  K 4.7  --  5.4* 4.5  CL 102  --  105 104  CO2 27  --  25 28  GLUCOSE 126*  --  106* 110*  BUN 41*  --  42* 42*  CREATININE 1.08  --  1.03 1.00  CALCIUM 9.5  --  9.4 9.4  MG 2.4 2.5*  --   --   PHOS 4.2 4.3  --   --     GFR: Estimated Creatinine Clearance: 68.6 mL/min (by C-G formula based on SCr of 1 mg/dL). Liver Function  Tests: No results for input(s): AST, ALT, ALKPHOS, BILITOT, PROT, ALBUMIN in the last 168 hours. No results for input(s): LIPASE, AMYLASE in the last 168 hours. No results for input(s): AMMONIA in the last 168 hours. Coagulation Profile: No results for input(s): INR, PROTIME in the last 168 hours. Cardiac Enzymes: No results for input(s): CKTOTAL, CKMB, CKMBINDEX, TROPONINI in the last 168 hours. BNP (last 3 results) No results for input(s): PROBNP in the last 8760 hours. HbA1C: No results for input(s): HGBA1C in the last 72 hours. CBG: Recent Labs  Lab 02/22/21 1953 02/22/21 2353 02/23/21 0415 02/23/21 0756 02/23/21 1133  GLUCAP 163* 161* 125* 92 146*    Lipid Profile: No results for input(s): CHOL, HDL, LDLCALC, TRIG, CHOLHDL, LDLDIRECT in the last 72 hours. Thyroid Function Tests: No results for input(s): TSH, T4TOTAL, FREET4, T3FREE, THYROIDAB in the last 72 hours. Anemia Panel: No results for input(s): VITAMINB12, FOLATE, FERRITIN, TIBC, IRON,  RETICCTPCT in the last 72 hours. Urine analysis:    Component Value Date/Time   COLORURINE YELLOW 02/11/2021 0725   APPEARANCEUR CLEAR 02/11/2021 0725   LABSPEC 1.024 02/11/2021 0725   PHURINE 5.0 02/11/2021 0725   GLUCOSEU NEGATIVE 02/11/2021 0725   GLUCOSEU 500 (A) 11/14/2020 1345   HGBUR NEGATIVE 02/11/2021 0725   BILIRUBINUR NEGATIVE 02/11/2021 0725   KETONESUR NEGATIVE 02/11/2021 0725   PROTEINUR NEGATIVE 02/11/2021 0725   UROBILINOGEN 0.2 11/14/2020 1345   NITRITE NEGATIVE 02/11/2021 0725   LEUKOCYTESUR NEGATIVE 02/11/2021 0725   Sepsis Labs: @LABRCNTIP (procalcitonin:4,lacticidven:4)  ) No results found for this or any previous visit (from the past 240 hour(s)).    Radiology Studies: No results found.   Scheduled Meds:   stroke: mapping our early stages of recovery book   Does not apply Once   amiodarone  400 mg Per Tube Daily   Followed by   Derrill Memo ON 02/28/2021] amiodarone  200 mg Per Tube Daily   amLODipine  10 mg Per Tube Daily   carvedilol  25 mg Per Tube BID WC   chlorhexidine  15 mL Mouth Rinse BID   enoxaparin (LOVENOX) injection  40 mg Subcutaneous Q24H   free water  100 mL Per Tube Q4H   gabapentin  100 mg Per Tube TID   insulin aspart  3-9 Units Subcutaneous Q4H   insulin detemir  25 Units Subcutaneous BID   losartan  50 mg Per Tube BID   mouth rinse  15 mL Mouth Rinse q12n4p   nutrition supplement (JUVEN)  1 packet Per Tube BID BM   pantoprazole sodium  40 mg Per Tube QHS   senna-docusate  1 tablet Per Tube BID   Continuous Infusions:  feeding supplement (GLUCERNA 1.5 CAL) 1,000 mL (02/23/21 0500)     LOS: 31 days   Kindall Swaby MD  02/23/2021 at 12:09 PM   [facility] Office  [phone]

## 2021-02-24 LAB — GLUCOSE, CAPILLARY
Glucose-Capillary: 111 mg/dL — ABNORMAL HIGH (ref 70–99)
Glucose-Capillary: 123 mg/dL — ABNORMAL HIGH (ref 70–99)
Glucose-Capillary: 149 mg/dL — ABNORMAL HIGH (ref 70–99)
Glucose-Capillary: 161 mg/dL — ABNORMAL HIGH (ref 70–99)
Glucose-Capillary: 82 mg/dL (ref 70–99)
Glucose-Capillary: 91 mg/dL (ref 70–99)

## 2021-02-24 NOTE — Progress Notes (Signed)
Occupational Therapy Treatment Note  Pt tolerated being up in chair @ nurses station for @ 1 hr. Pt resting comfortably asleep in chair on arrival back to nurses station. Pt again not combative and allowing therapist to rub his arm; pt opens eyes to name and sustains gaze at therapist. Mayo Ao used to return to bed. Soft elbow splint positioned R elbow with elbow @ 30 degrees flexion. Pt tolerated well. Would recommend pt wear splints ( R resting hand and elbow splint) at night when sleeping to help manage further contracture development.  Will continue to follow acutely.     02/24/21 1124  OT Visit Information  Last OT Received On 02/24/21  Assistance Needed +2  History of Present Illness 85 y.o. M who presents with acute onset R sided weakness, facial droop, numbness, aphasia with left gaze preference. CTH showing L thalamic ICH.  S/P peg tube 5/25.Significant PMH: afib, DM2, HLD, HTN, stroke.  Precautions  Precautions Fall  Precaution Comments global aphasia, PEG (feeds paused and disconnected during session)  Pain Assessment  Pain Assessment Faces  Faces Pain Scale 0  Cognition  Arousal/Alertness Awake/alert  Behavior During Therapy Flat affect;Restless  Overall Cognitive Status Impaired/Different from baseline  Area of Impairment Following commands;Attention;Awareness;Problem solving  Current Attention Level Sustained  Following Commands Follows one step commands inconsistently  Awareness Intellectual  Problem Solving Slow processing;Decreased initiation;Difficulty sequencing;Requires verbal cues;Requires tactile cues  General Comments Appeared to follow command to "grab bar" of maximove  Bed Mobility  General bed mobility comments total A +2 for rolling. Pt holds onto rail to pull self over but unsure if on command  Transfers  General transfer comment used Maximove to return to bed  Exercises  Exercises General Upper Extremity  General Exercises - Upper Extremity  Shoulder  ADduction PROM;Right;10 reps  Shoulder Flexion PROM;Right;10 reps;Supine (to @ 90)  Wrist Flexion PROM;Right;10 reps  Wrist Extension PROM;Right;10 reps  Shoulder Exercises  Elbow Flexion PROM;Right;10 reps;Supine  Elbow Extension PROM;Right;10 reps;Supine  Other Exercises  Other Exercises shoulder rotation - only @ 30 degree -note shoulder tightness (pecs/anterior shoulder)  OT - End of Session  Activity Tolerance Patient tolerated treatment well  Patient left in bed;with call bell/phone within reach;with bed alarm set;with restraints reapplied;with SCD's reapplied (L sidelying)  Nurse Communication Mobility status;Need for lift equipment;Other (comment) (splint schedule)  OT Assessment/Plan  OT Plan Discharge plan remains appropriate;Frequency remains appropriate  OT Visit Diagnosis Other abnormalities of gait and mobility (R26.89);Muscle weakness (generalized) (M62.81);Apraxia (R48.2);Other symptoms and signs involving the nervous system (R29.898);Other symptoms and signs involving cognitive function;Cognitive communication deficit (R41.841);Hemiplegia and hemiparesis  Symptoms and signs involving cognitive functions Other Nontraumatic ICH  Hemiplegia - Right/Left Right  Hemiplegia - dominant/non-dominant Dominant  Hemiplegia - caused by Nontraumatic intracerebral hemorrhage  Pain - Right/Left Right  OT Frequency (ACUTE ONLY) Min 2X/week  Follow Up Recommendations SNF;Supervision/Assistance - 24 hour  OT Equipment Wheelchair (measurements OT);Wheelchair cushion (measurements OT);Hospital bed  AM-PAC OT "6 Clicks" Daily Activity Outcome Measure (Version 2)  Help from another person eating meals? 1  Help from another person taking care of personal grooming? 1  Help from another person toileting, which includes using toliet, bedpan, or urinal? 1  Help from another person bathing (including washing, rinsing, drying)? 1  Help from another person to put on and taking off regular upper  body clothing? 1  Help from another person to put on and taking off regular lower body clothing? 1  6 Click Score 6  OT  Goal Progression  Progress towards OT goals Progressing toward goals  Acute Rehab OT Goals  Patient Stated Goal none stated  OT Goal Formulation Patient unable to participate in goal setting  Time For Goal Achievement 03/09/21  Potential to Achieve Goals Fair  ADL Goals  Pt Will Perform Grooming with max assist;bed level  Pt Will Transfer to Toilet with max assist;with +2 assist;squat pivot transfer;bedside commode  Pt/caregiver will Perform Home Exercise Program Right Upper extremity;Increased ROM;Increased strength;With minimal assist  Additional ADL Goal #1 Pt will tolerate sitting at EOB for 5 minutes with Mod A in preparation for ADLs  Additional ADL Goal #2 Pt will sustain attention to simple ADL with Mod cues  Additional ADL Goal #3 Pt will complete hand to mouth pattern with min A during functional task  Additional ADL Goal #4 Pt will maintain midline postural control EOB with min A in preparation for functional tasks  OT Time Calculation  OT Start Time (ACUTE ONLY) 1015  OT Stop Time (ACUTE ONLY) 1040  OT Time Calculation (min) 25 min  OT General Charges  $OT Visit 1 Visit  OT Treatments  $Therapeutic Activity 23-37 mins  Maurie Boettcher, OT/L   Acute OT Clinical Specialist Acute Rehabilitation Services Pager [phone] Office [phone]

## 2021-02-24 NOTE — Progress Notes (Signed)
  Speech Language Pathology Treatment: Dysphagia;Cognitive-Linquistic  Patient Details Name: Brian [name] MRN: [id number] DOB: [birth year] Today's Date: 02/24/2021 Time: 1000-1020 SLP Time Calculation (min) (ACUTE ONLY): 20 min  Assessment / Plan / Recommendation Clinical Impression  Pt seen up in chair at RN station. Pt very calm in stimulating environment, not attending to surroundings. SLP positioned him upright and offered him tastes of a mango icee. Pt responded with sucking and consumption of icee if it reached his lips. However, despite gentle encouragement pt would not puposefully grasped the icee to taste it and pushed away the therapist hand if it appeared in his visual field. He did not appear angry or upset nor did he refuse with any gesture other than pushing away therapists hand. He appeared to enjoy the flavor of the icee as he did with the strawberry ice cream last session. Pt does have delayed coughing with these textures. Pt occasionally participated in a purposeful use of a towel or a pillow if offered in appropriate context. Will continue efforts.   HPI HPI: Pt is a 85 y.o. M who presents with acute onset R sided weakness, facial droop, numbness, aphasia with left gaze preference. CTH showing L thalamic ICH. Significant PMH: afib, DM2, HLD, HTN, stroke. Persistent dysphagia s/p PEG 02/08/21.      SLP Plan  Continue with current plan of care       Recommendations  Diet recommendations: NPO                Oral Care Recommendations: Oral care QID Follow up Recommendations: Skilled Nursing facility SLP Visit Diagnosis: Dysphagia, oropharyngeal phase (R13.12) Plan: Continue with current plan of care       GO               [name], MA [place] Pager [phone] Office [phone]  Lynann Beaver 02/24/2021, 12:57 PM

## 2021-02-24 NOTE — Progress Notes (Signed)
Occupational Therapy Note  Pt seen for splint check regarding R elbow splint. No issues noted. Improved positioning R elbow after removal of splint. NT educated on donning/doffing and splint wearing time. Recommend pt wear splints during the night at this time.     02/24/21 1600  OT Visit Information  Last OT Received On 02/24/21  Assistance Needed +2  History of Present Illness 85 y.o. M who presents with acute onset R sided weakness, facial droop, numbness, aphasia with left gaze preference. CTH showing L thalamic ICH.  S/P peg tube 5/25.Significant PMH: afib, DM2, HLD, HTN, stroke.  Precautions  Precautions Fall  Precaution Comments global aphasia, PEG (feeds paused and disconnected during session)  Pain Assessment  Pain Assessment Faces  Faces Pain Scale 0  General Comments  General comments (skin integrity, edema, etc.) Pt seen for splint check. Elbow splint removed. No issues noted. Assited wtih pt scootin up in bed. Pt appeared to flex his L knee to assist with pushing up in bed  OT - End of Session  Activity Tolerance Patient tolerated treatment well  Patient left in bed;with call bell/phone within reach;with bed alarm set  Nurse Communication Mobility status;Other (comment) (splint wearing schedule)  OT Goal Progression  Progress towards OT goals Progressing toward goals  Acute Rehab OT Goals  Patient Stated Goal none stated  OT Goal Formulation Patient unable to participate in goal setting  Time For Goal Achievement 03/09/21  Potential to Achieve Goals Fair  ADL Goals  Pt Will Perform Grooming with max assist;bed level  Pt Will Transfer to Toilet with max assist;with +2 assist;squat pivot transfer;bedside commode  Pt/caregiver will Perform Home Exercise Program Right Upper extremity;Increased ROM;Increased strength;With minimal assist  Additional ADL Goal #1 Pt will tolerate sitting at EOB for 5 minutes with Mod A in preparation for ADLs  Additional ADL Goal #2 Pt will  sustain attention to simple ADL with Mod cues  Additional ADL Goal #3 Pt will complete hand to mouth pattern with min A during functional task  Additional ADL Goal #4 Pt will maintain midline postural control EOB with min A in preparation for functional tasks  OT Time Calculation  OT Start Time (ACUTE ONLY) 1500  OT Stop Time (ACUTE ONLY) 1511  OT Time Calculation (min) 11 min  OT General Charges  $OT Visit 1 Visit  OT Treatments  $Orthotics/Prosthetics Check 8-22 mins   Maurie Boettcher, OT/L   Acute OT Clinical Specialist Acute Rehabilitation Services Pager [phone] Office [phone]

## 2021-02-24 NOTE — Progress Notes (Signed)
Occupational Therapy Treatment Patient Details Name: Brian [name] MRN: [id number] DOB: [birth year] Today's Date: 02/24/2021    History of present illness 85 y.o. M who presents with acute onset R sided weakness, facial droop, numbness, aphasia with left gaze preference. CTH showing L thalamic ICH.  S/P peg tube 5/25.Significant PMH: afib, DM2, HLD, HTN, stroke.   OT comments  Used Maximove to mobilize pt OOB to recliner. Pt appeared to grab bar of maximove on command. Pushed to gym where a mirror was used to increase self awareness. Pt did attend to himself in the mirror. Pt followed a command to throw a cup away in the trash can after significant delay. Pt sat at Nurses station for @ 1 hr. Alert and looking around initially. Mitt left off and pt pulled his mask down around his chin and pulled blankets up around him as he likes to be covered - nsg aware. Left with Gospel music playing and with nurses supervising him. After working with pt and approaching from L side, pt did not swat out against therapist and allowed therapist to engage and rub his arm. Will return.   Follow Up Recommendations  SNF;Supervision/Assistance - 24 hour    Equipment Recommendations  Wheelchair (measurements OT);Wheelchair cushion (measurements OT);Hospital bed    Recommendations for Other Services      Precautions / Restrictions Precautions Precautions: Fall Precaution Comments: global aphasia, PEG (feeds paused and disconnected during session) Restrictions Weight Bearing Restrictions: No       Mobility Bed Mobility Overal bed mobility: Needs Assistance             General bed mobility comments: total A +2 for rolling. Pt holds onto rail to pull self over but unsure if on command    Transfers                 General transfer comment: Lifted to recliner    Balance     Sitting balance-Leahy Scale: Poor Sitting balance - Comments: Prefers to pull self toward L side of chair                                    ADL either performed or assessed with clinical judgement   ADL                                         General ADL Comments: Total Assist wtih ADL tasks. Wiping his face spontaneously but would not initiate washing face, rubbing lotion on body with gestures or VC; pulls hand away at hand over hand attempts     Vision       Perception     Praxis      Cognition Arousal/Alertness: Awake/alert Behavior During Therapy: Flat affect;Restless Overall Cognitive Status: Impaired/Different from baseline Area of Impairment: Following commands;Attention;Awareness;Problem solving                   Current Attention Level: Sustained   Following Commands: Follows one step commands inconsistently     Problem Solving: Slow processing;Decreased initiation;Difficulty sequencing;Requires verbal cues;Requires tactile cues General Comments: Appeared to follow command to "grab bar" of maximove adn "throw away cup" when presented with trashcan        Exercises     Shoulder Instructions       General Comments taken to  gym and used mirror to let pt see himself. Pt did attend to himself in the mirror    Pertinent Vitals/ Pain       Pain Assessment: Faces Faces Pain Scale: No hurt  Home Living                                          Prior Functioning/Environment              Frequency  Min 2X/week        Progress Toward Goals  OT Goals(current goals can now be found in the care plan section)  Progress towards OT goals: Progressing toward goals  Acute Rehab OT Goals Patient Stated Goal: none stated OT Goal Formulation: Patient unable to participate in goal setting Time For Goal Achievement: 03/09/21 Potential to Achieve Goals: Fair ADL Goals Pt Will Perform Grooming: with max assist;bed level Pt Will Transfer to Toilet: with max assist;with +2 assist;squat pivot transfer;bedside  commode Pt/caregiver will Perform Home Exercise Program: Right Upper extremity;Increased ROM;Increased strength;With minimal assist Additional ADL Goal #1: Pt will tolerate sitting at EOB for 5 minutes with Mod A in preparation for ADLs Additional ADL Goal #2: Pt will sustain attention to simple ADL with Mod cues Additional ADL Goal #3: Pt will complete hand to mouth pattern with min A during functional task Additional ADL Goal #4: Pt will maintain midline postural control EOB with min A in preparation for functional tasks  Plan Discharge plan remains appropriate;Frequency remains appropriate    Co-evaluation                 AM-PAC OT "6 Clicks" Daily Activity     Outcome Measure   Help from another person eating meals?: Total Help from another person taking care of personal grooming?: Total Help from another person toileting, which includes using toliet, bedpan, or urinal?: Total Help from another person bathing (including washing, rinsing, drying)?: Total Help from another person to put on and taking off regular upper body clothing?: Total Help from another person to put on and taking off regular lower body clothing?: Total 6 Click Score: 6    End of Session    OT Visit Diagnosis: Other abnormalities of gait and mobility (R26.89);Muscle weakness (generalized) (M62.81);Apraxia (R48.2);Other symptoms and signs involving the nervous system (R29.898);Other symptoms and signs involving cognitive function;Cognitive communication deficit (R41.841);Hemiplegia and hemiparesis Symptoms and signs involving cognitive functions: Other Nontraumatic ICH Hemiplegia - Right/Left: Right Hemiplegia - dominant/non-dominant: Dominant Hemiplegia - caused by: Nontraumatic intracerebral hemorrhage   Activity Tolerance Patient tolerated treatment well   Patient Left in chair;with call bell/phone within reach;Other (comment) (with nursing at desk)   Nurse Communication Need for lift equipment         Time: [id number] OT Time Calculation (min): 35 min  Charges: OT General Charges $OT Visit: 1 Visit OT Treatments $Self Care/Home Management : 23-37 mins  Maurie Boettcher, OT/L   Acute OT Clinical Specialist [place] Pager [phone] Office [phone]    [facility] 02/24/2021, 11:19 AM

## 2021-02-24 NOTE — Progress Notes (Signed)
PROGRESS NOTE    Brian [name]  PFX:[id number] DOB: 1935-11-18 DOA: 01/23/2021 PCP: [name], MD   Brief Narrative:   Brian [name] is a 85 y.o. male with PMH significant for history of atrial fibrillation on Eliquis, diabetes mellitus type 2, hyperlipidemia, hypertension, obesity, prior left MCA stroke who presented to hospital with acute right-sided weakness facial droop and right-sided numbness with aphasia and left gaze preference.  He had global aphasia to present with.  Patient was noted to have left thalamic intracranial hemorrhage with a 6 mm left to right shift on the CT scan.  Eliquis was reversed with andexanet alfa and was admitted to hospital for further evaluation and treatment.   During hospitalization, patient also required cleviprex infusion for hypertensive emergency through 01/26/2021.  He was admitted to neuro ICU under stroke service.  Patient developed fever and lethargy on 01/26/21.  Patient also failed a swallow evaluation and cortrak tube tube was placed in.  Subsequently, patient underwent PEG tube placement on 02/08/2021.  He continued to have persistent low-grade fever but leukocytosis trended down.  EEG showed focal left sided slowing but no seizures.  Repeat CT scan showed evolving intracranial hemorrhage.  Neurology had an impression of poor prognosis.  Patient was then transferred with ICU and hospitalist resumed care on 02/01/2021.  At this time, plan is disposition to skilled nursing facility.    Assessment & Plan   Intracerebral hemorrhage with cerebral edema and midline shift. Patient initially received reversal agent for anticoagulant reversal.  There was mild midline shift from left thalamus intracranial hemorrhage.  Repeat CT scan showed stability.  2D echocardiogram showed left-ventricular ejection fraction of 55 to 60%. Neurology saw the patient but did not recommend any surgical intervention.  Conservative management was pursued.  Patient will follow-up with  neurology in 4 to 6 weeks.  Continue statins.    Patient was seen by physical therapy, occupational therapy who recommended skilled nursing facility placement.  Currently stable but weakness has not improved.  Fever/cough  Improved.  Chest x-Nyan unremarkable, UA unremarkable, procalcitonin 0.2.  No indication for antibiotics.  Latest temperature of 99.1 F.  Hypertensive emergency On presentation.  Improved at this time.  Initially needed Cleviprex infusion.  SBP goal is less than 160.  Continue Coreg, losartan, amlodipine.    Latest blood pressure of 105/47  Sustained atrial tachycardia Improved.  Continue amiodarone and Coreg.    History of atrial fibrillation Anticoagulation on hold due to intracranial hemorrhage.  Continue amiodarone and Coreg.  Controlled at this time.  Possible aspiration pneumonia   Completed 7-day course of Unasyn. Continue aspiration precautions.  Dysphagia Secondary to stroke.  Status post PEG tube placement on 02/08/2021.  Continue aspiration precautions.  Seen by speech therapy on 02/17/2021 and recommendation is to continue PEG tube.  Continue tube feeds at this time.  Patient has been tolerating feedings.    Diabetes mellitus, type II -Continue Levemir, insulin sliding scale insulin.  Continue blood glucose monitoring.  On tube feeding.  Latest point-of-care glucose of 82  Acute anemia-normocytic/hypochromic anemia. Latest hemoglobin of 10.1.  No evidence of bleeding.  Continue to monitor.  Pressure injury.  Left buttocks stage II.  Documented on 02/05/2021.  Started with a blister.   Continue wound care.  Pressure Injury 02/05/21 Buttocks Left Stage 2 -  Partial thickness loss of dermis presenting as a shallow open injury with a red, pink wound bed without slough. blister ruptured on left buttock, serosanguinous drainage (Active)  02/05/21 1613  Location: Buttocks  Location Orientation: Left  Staging: Stage 2 -  Partial thickness loss of dermis presenting  as a shallow open injury with a red, pink wound bed without slough.  Wound Description (Comments): blister ruptured on left buttock, serosanguinous drainage  Present on Admission: No      DVT Prophylaxis  SCDs  Code Status: Full  Family Communication:  Spoke with the son on 6/ 7.  Disposition Plan:  Status is: Inpatient  Remains inpatient appropriate because:Inpatient level of care appropriate due to severity of illness need for rehabilitation   Dispo: The patient is from: Home              Anticipated d/c is to: SNF as per PT recommendation.  Transition of care on board for appropriate disposition.              Patient currently is medically stable to d/c.   Difficult to place patient; yes  Consultants Neurology Cardiology Interventional radiology  Procedures  2D echocardiogram Carotid ultrasound Gastrostomy tube placement 02/08/21  Antibiotics   None at present  Subjective:  Today, patient was seen and examined at bedside.  No interval changes.  Nonverbal.  Objective:   Vitals:   02/24/21 0400 02/24/21 0500 02/24/21 0742 02/24/21 1123  BP: (!) 148/78  (!) 155/82 (!) 105/47  Pulse: 66  70   Resp: 18  20   Temp: 98.6 F (37 C)  98.9 F (37.2 C)   TempSrc: Axillary  Axillary   SpO2: 99%  100%   Weight:  101 kg    Height:        Intake/Output Summary (Last 24 hours) at 02/24/2021 1248 Last data filed at 02/24/2021 0100 Gross per 24 hour  Intake --  Output 1450 ml  Net -1450 ml    Filed Weights   02/21/21 0404 02/22/21 0336 02/24/21 0500  Weight: 101.6 kg 100.4 kg 101 kg   Physical exam  General: Obese built, not in obvious distress, nonverbal, tracks, HENT:   No scleral pallor or icterus noted. Oral mucosa is moist.  Chest:  Clear breath sounds.  Diminished breath sounds bilaterally. No crackles or wheezes.  CVS: S1 &S2 heard. No murmur.  Regular rate and rhythm. Abdomen: Soft, nontender, nondistended.  Bowel sounds are heard.   Extremities: No  cyanosis, clubbing or edema.  Peripheral pulses are palpable. Psych: Alert, awake but nonverbal, moves left extremities. CNS: Moves left extremity, right-sided weakness. Skin: Warm and dry.  No rashes noted.   Data Reviewed: I have personally reviewed following labs and imaging studies  CBC: Recent Labs  Lab 02/20/21 0530  WBC 5.2  HGB 10.1*  HCT 35.1*  MCV 89.1  PLT 449    Basic Metabolic Panel: Recent Labs  Lab 02/18/21 0419 02/20/21 0530 02/21/21 0521  NA  --  138 138  K  --  5.4* 4.5  CL  --  105 104  CO2  --  25 28  GLUCOSE  --  106* 110*  BUN  --  42* 42*  CREATININE  --  1.03 1.00  CALCIUM  --  9.4 9.4  MG 2.5*  --   --   PHOS 4.3  --   --     GFR: Estimated Creatinine Clearance: 68.9 mL/min (by C-G formula based on SCr of 1 mg/dL). Liver Function Tests: No results for input(s): AST, ALT, ALKPHOS, BILITOT, PROT, ALBUMIN in the last 168 hours. No results for input(s): LIPASE, AMYLASE in  the last 168 hours. No results for input(s): AMMONIA in the last 168 hours. Coagulation Profile: No results for input(s): INR, PROTIME in the last 168 hours. Cardiac Enzymes: No results for input(s): CKTOTAL, CKMB, CKMBINDEX, TROPONINI in the last 168 hours. BNP (last 3 results) No results for input(s): PROBNP in the last 8760 hours. HbA1C: No results for input(s): HGBA1C in the last 72 hours. CBG: Recent Labs  Lab 02/23/21 1703 02/23/21 2038 02/24/21 0102 02/24/21 0637 02/24/21 1135  GLUCAP 134* 97 123* 149* 82    Lipid Profile: No results for input(s): CHOL, HDL, LDLCALC, TRIG, CHOLHDL, LDLDIRECT in the last 72 hours. Thyroid Function Tests: No results for input(s): TSH, T4TOTAL, FREET4, T3FREE, THYROIDAB in the last 72 hours. Anemia Panel: No results for input(s): VITAMINB12, FOLATE, FERRITIN, TIBC, IRON, RETICCTPCT in the last 72 hours. Urine analysis:    Component Value Date/Time   COLORURINE YELLOW 02/11/2021 0725   APPEARANCEUR CLEAR 02/11/2021 0725    LABSPEC 1.024 02/11/2021 0725   PHURINE 5.0 02/11/2021 0725   GLUCOSEU NEGATIVE 02/11/2021 0725   GLUCOSEU 500 (A) 11/14/2020 1345   HGBUR NEGATIVE 02/11/2021 0725   BILIRUBINUR NEGATIVE 02/11/2021 0725   KETONESUR NEGATIVE 02/11/2021 0725   PROTEINUR NEGATIVE 02/11/2021 0725   UROBILINOGEN 0.2 11/14/2020 1345   NITRITE NEGATIVE 02/11/2021 0725   LEUKOCYTESUR NEGATIVE 02/11/2021 0725   Sepsis Labs: @LABRCNTIP (procalcitonin:4,lacticidven:4)  ) No results found for this or any previous visit (from the past 240 hour(s)).    Radiology Studies: No results found.   Scheduled Meds:   stroke: mapping our early stages of recovery book   Does not apply Once   amiodarone  400 mg Per Tube Daily   Followed by   Derrill Memo ON 02/28/2021] amiodarone  200 mg Per Tube Daily   amLODipine  10 mg Per Tube Daily   carvedilol  25 mg Per Tube BID WC   chlorhexidine  15 mL Mouth Rinse BID   enoxaparin (LOVENOX) injection  40 mg Subcutaneous Q24H   free water  100 mL Per Tube Q4H   gabapentin  100 mg Per Tube TID   insulin aspart  3-9 Units Subcutaneous Q4H   insulin detemir  25 Units Subcutaneous BID   losartan  50 mg Per Tube BID   mouth rinse  15 mL Mouth Rinse q12n4p   nutrition supplement (JUVEN)  1 packet Per Tube BID BM   pantoprazole sodium  40 mg Per Tube QHS   senna-docusate  1 tablet Per Tube BID   Continuous Infusions:  feeding supplement (GLUCERNA 1.5 CAL) Stopped (02/24/21 0930)     LOS: 32 days   Parissa Chiao MD  02/24/2021 at 12:48 PM   [facility] Office  [phone]

## 2021-02-24 NOTE — Progress Notes (Addendum)
12:30pm: CSW was notified by patient's son [name] that the patient will be going to [place].   CSW spoke briefly with Tressa Busman at [facility] who states he will return call to with more information soon.  9:15am: CSW was advised that [facility] has no long term beds available at this time.  8am: CSW spoke with patient's son [name] who states he has a meeting scheduled with Scientist, physiological at [facility] today at 10:30am. [name] requesting CSW find alternative facilities for possible placement.  CSW spoke with Claiborne Billings at [facility] who states there are no long term beds available at [facility] but there are at [facility]. Claiborne Billings reports the private pay rate is ~$16k up front and ~10k a month after.   CSW attempted to reach Roann at [facility] to discuss private pay options - voicemail left requesting a return call.  CSW sent messages to admissions at [facility] and [facility] requesting private pay information.  Madilyn Fireman, MSW, LCSW Transitions of Care  Clinical Social Worker II [phone]

## 2021-02-24 NOTE — TOC Progression Note (Addendum)
Transition of Care [facility]) - Progression Note    Patient Details  Name: Brian [name] MRN: [id number] Date of Birth: Dec 15, 1935  Transition of Care [facility]) CM/SW [place], RN Phone Number: 02/24/2021, 11:35 AM  Clinical Narrative:    Case management spoke with Brian [name], CM at [facility] and asked about LTC bed availability for private pay.  Brian [name], CM has an available semi-private male long term care bed and plans to call the patient's son, Brian [name], on the phone and discuss possible admission if the son accepts the offer.  I also spoke with primary care RN this morning about having the patient's neuro mitts removed and start other safety means for safety in that patient will be unable to transfer to a facility until restraints have been discontinued.  CM and MSW will continue to follow the patient for LTC placement by private pay means since the patient was declined by Medicare for payment and placement.   Expected Discharge Plan: Long Term Nursing Home Barriers to Discharge: Other (must enter comment) (The patient will need skilled nursing care placement through private funds)  Expected Discharge Plan and Services Expected Discharge Plan: Long Term Nursing Home In-house Referral: Clinical Social Work, Hospice / Palliative Care Discharge Planning Services: CM Consult Post Acute Care Choice: Nursing Home Living arrangements for the past 2 months: Single Family Home                                       Social Determinants of Health (SDOH) Interventions    Readmission Risk Interventions No flowsheet data found.

## 2021-02-25 LAB — GLUCOSE, CAPILLARY
Glucose-Capillary: 117 mg/dL — ABNORMAL HIGH (ref 70–99)
Glucose-Capillary: 84 mg/dL (ref 70–99)
Glucose-Capillary: 130 mg/dL — ABNORMAL HIGH (ref 70–99)
Glucose-Capillary: 91 mg/dL (ref 70–99)
Glucose-Capillary: 63 mg/dL — ABNORMAL LOW (ref 70–99)
Glucose-Capillary: 106 mg/dL — ABNORMAL HIGH (ref 70–99)

## 2021-02-25 NOTE — Progress Notes (Addendum)
   Palliative Medicine Inpatient Follow Up Note  Reason for consult:  "goals of care, family interested in possible hospice"   HPI:  Per intake H&P --> Brian [name] is a 85 y.o. male with PMH significant for history of atrial fibrillation on Eliquis, diabetes mellitus type 2, hyperlipidemia, hypertension, obesity, prior left MCA stroke who presented to hospital with acute right-sided weakness facial droop and right-sided numbness with aphasia and left gaze preference.  He had global aphasia to present with.  Patient was noted to have left thalamic intracranial hemorrhage with a 6 mm left to right shift on the CT scan.  Eliquis was reversed with andexanet alfa and was admitted to hospital for further evaluation and treatment.    Palliative care has been asked to the discussed the option of hospice with patient's family based upon their expressed interest.  Today's Discussion (02/25/2021):  *Please note that this is a verbal dictation therefore any spelling or grammatical errors are due to the "[employer] One" system interpretation.  Chart reviewed.  It appears that [facility] was able to call patients son, Brian [name] to provide information on IP Hospice.  Brian [name] remains in stable condition and is ready for transfer to skilled nursing once a bed becomes available. This is the patients family preference. They do not wish to stop artificial nutrition therefore would not be able to transition to hospice.  I have called patients son Brian [name] and daughter, Brian [name] to further clarify code status. I am awaiting a response at this time though left VM for both patient representatives.   Objective Assessment: Vital Signs Vitals:   02/25/21 0843 02/25/21 1133  BP: (!) 153/65 (!) 148/61  Pulse: 65 (!) 58  Resp: 18 18  Temp:  98.7 F (37.1 C)  SpO2:  98%    Intake/Output Summary (Last 24 hours) at 02/25/2021 1226 Last data filed at 02/25/2021 9242 Gross per 24 hour  Intake 14866.42 ml  Output 800 ml   Net 14066.42 ml   Last Weight  Most recent update: 02/25/2021  3:51 AM    Weight  102.2 kg (225 lb 5 oz)            Gen: [name] African-American male in no acute distress HEENT: Dry mucous membranes CV: Regular rate and irregular rhythm PULM: cOn 2LPM [place] ABD: G-tube EXT: No edema Neuro: Somnolent  SUMMARY OF RECOMMENDATIONS   Full Code / Full scope of care -patient's family plan to review the hard choices for loving people book and have ongoing conversations regarding CODE STATUS  Goals are for long term placement  Information on hospice has been provided should things evolve that way  Ongoing incremental PMT support  Time Spent: 15 Greater than 50% of the time was spent in counseling and coordination of care ______________________________________________________________________________________ [place] Team Team Cell Phone: [phone] Please utilize secure chat with additional questions, if there is no response within 30 minutes please call the above phone number  Palliative Medicine Team providers are available by phone from 7am to 7pm daily and can be reached through the team cell phone.  Should this patient require assistance outside of these hours, please call the patient's attending physician.

## 2021-02-26 LAB — GLUCOSE, CAPILLARY
Glucose-Capillary: 110 mg/dL — ABNORMAL HIGH (ref 70–99)
Glucose-Capillary: 117 mg/dL — ABNORMAL HIGH (ref 70–99)
Glucose-Capillary: 186 mg/dL — ABNORMAL HIGH (ref 70–99)
Glucose-Capillary: 85 mg/dL (ref 70–99)
Glucose-Capillary: 89 mg/dL (ref 70–99)
Glucose-Capillary: 91 mg/dL (ref 70–99)
Glucose-Capillary: 92 mg/dL (ref 70–99)

## 2021-02-26 NOTE — Consult Note (Signed)
WOC Nurse Consult Note: Patient receiving care in [facility] [phone] Reason for Consult: Stage 2 on sacrum Bedside RN allowed to activate skin standing orders for Stage 2 wounds. This includes applying a hydrocolloid dressing to the wound and change every 3 days or sooner if needed. These orders have been placed.   Monitor the wound area(s) for worsening of condition such as: Signs/symptoms of infection, increase in size, development of or worsening of odor, development of pain, or increased pain at the affected locations.   Notify the medical team if any of these develop.  Thank you for the consult. [place] nurse will not follow at this time.   Please re-consult the [place] team if needed.  Cathlean Marseilles Tamala Julian, MSN, RN, [place], Lysle Pearl, [facility] Wound Treatment Associate Pager [phone]

## 2021-02-26 NOTE — Progress Notes (Addendum)
   Palliative Medicine Inpatient Follow Up Note  Palliative care has called Abran's son [name] and daughter, Ennis Forts x3 each and left VM's. We have not received a call back thus far to further discuss code status.   Nicodemus's family is seeking long term placement.  ___________________________________________ Addendum:  PMT had not received a call back from patients family. We will sign off at this time though please do not hesitate to contact our team is additional support is needed.  No Charge ______________________________________________________________________________________ [place] Team Team Cell Phone: [phone] Please utilize secure chat with additional questions, if there is no response within 30 minutes please call the above phone number  Palliative Medicine Team providers are available by phone from 7am to 7pm daily and can be reached through the team cell phone.  Should this patient require assistance outside of these hours, please call the patient's attending physician.

## 2021-02-26 NOTE — Progress Notes (Signed)
PROGRESS NOTE    Brian [name]  KZS:[id number] DOB: 12-01-1935 DOA: 01/23/2021 PCP: [name], MD   Chief Complaint  Patient presents with   Code Stroke    Brief Narrative:   85 year old male prior history of atrial fibrillation on Eliquis, type 2 diabetes hypertension, hyperlipidemia, left MCA stroke, obesity presents to the hospital with acute right-sided weakness, facial droop and right-sided numbness, aphasia, left gaze preference.  CT of the head showed left thalamic intracranial hemorrhage with 6 mm left-to-right shift.  Patient was on Eliquis for atrial fibrillation which was reversed with andexanet alfa and admitted for further evaluation. During hospitalization patient also required Cleviprex Prax infusion for hypertensive emergency.  He was initially admitted to neuro ICU under stroke service and transferred to [facility] on 02/01/2021.  Patient failed swallow evaluation, underwent PEG placement on 02/08/2021.  Neurology suggested patient has poor prognosis.  Therapy evaluations recommending SNF. Assessment & Plan:   Active Problems:   Hypertensive crisis   ICH (intracerebral hemorrhage) (HCC)   SVT (supraventricular tachycardia) (HCC)   Pressure injury of skin   Intracerebral hemorrhage with cerebral edema and midline shift Secondary to Eliquis, reversed.  Repeat CT of the head showed evolving intracranial hemorrhage.  Echocardiogram showed preserved left ventricular ejection fraction.  No surgical intervention at this time.  Continue with statin and follow-up with neurology in the office in about 4 to 6 weeks.  Therapy evaluations recommending SNF.    Hypertensive emergency S/p Cleviprex infusion and weaned off Continue with Coreg losartan and amlodipine at this time.  Atrial fibrillation Rate controlled with Coreg and amiodarone.  Not on anticoagulation secondary to intracranial hemorrhage.    Aspiration pneumonia Completed the course of treatment continue with  aspiration precautions.   Dysphagia probably secondary to the stroke, s/p PEG placement on 02/08/2021. Continue with tube feeds.    Type 2 diabetes mellitus, insulin-dependent Continue with sliding scale insulin.   Acute on anemia of chronic disease Transfuse to keep hemoglobin greater than 7.  Pressure injury:   Pressure Injury 02/05/21 Buttocks Left Stage 2 -  Partial thickness loss of dermis presenting as a shallow open injury with a red, pink wound bed without slough. blister ruptured on left buttock, serosanguinous drainage (Active)  02/05/21 1613  Location: Buttocks  Location Orientation: Left  Staging: Stage 2 -  Partial thickness loss of dermis presenting as a shallow open injury with a red, pink wound bed without slough.  Wound Description (Comments): blister ruptured on left buttock, serosanguinous drainage  Present on Admission: No   Continue with wound care.          DVT prophylaxis: (Lovenox/) Code Status: (Full code) Family Communication: (None at bedside) Disposition:   Status is: Inpatient  Remains inpatient appropriate because:Unsafe d/c plan  Dispo: The patient is from: Home              Anticipated d/c is to: SNF              Patient currently is medically stable to d/c.   Difficult to place patient Yes       Consultants:  Palliative care  Procedures:   Antimicrobials: none.    Subjective: No complaints at this time.   Objective: Vitals:   02/26/21 0348 02/26/21 0406 02/26/21 0427 02/26/21 0809  BP: (!) 166/73  (!) 160/78 (!) 167/78  Pulse: 67  66 67  Resp: 18   18  Temp: 99.1 F (37.3 C)   99.1 F (37.3 C)  TempSrc: Oral   Oral  SpO2: 99%  98% 98%  Weight:  100.2 kg    Height:        Intake/Output Summary (Last 24 hours) at 02/26/2021 1059 Last data filed at 02/26/2021 0400 Gross per 24 hour  Intake 1868 ml  Output 1550 ml  Net 318 ml   Filed Weights   02/24/21 0500 02/25/21 0350 02/26/21 0406  Weight: 101 kg 102.2  kg 100.2 kg    Examination:  General exam: Appears calm and comfortable  Respiratory system: Clear to auscultation. Respiratory effort normal. Cardiovascular system: S1 & S2 heard, RRR. No pedal edema. Gastrointestinal system: Abdomen is nondistended, soft and nontender. Normal bowel sounds heard. Central nervous system: Alert, non verbal. . Extremities: Symmetric 5 x 5 power. Skin: No rashes, lesions or ulcers Psychiatry: cannot be assessed.     Data Reviewed: I have personally reviewed following labs and imaging studies  CBC: Recent Labs  Lab 02/20/21 0530  WBC 5.2  HGB 10.1*  HCT 35.1*  MCV 89.1  PLT 294    Basic Metabolic Panel: Recent Labs  Lab 02/20/21 0530 02/21/21 0521  NA 138 138  K 5.4* 4.5  CL 105 104  CO2 25 28  GLUCOSE 106* 110*  BUN 42* 42*  CREATININE 1.03 1.00  CALCIUM 9.4 9.4    GFR: Estimated Creatinine Clearance: 68.6 mL/min (by C-G formula based on SCr of 1 mg/dL).  Liver Function Tests: No results for input(s): AST, ALT, ALKPHOS, BILITOT, PROT, ALBUMIN in the last 168 hours.  CBG: Recent Labs  Lab 02/25/21 2007 02/25/21 2049 02/26/21 0001 02/26/21 0429 02/26/21 0811  GLUCAP 63* 106* 89 91 110*     No results found for this or any previous visit (from the past 240 hour(s)).       Radiology Studies: No results found.      Scheduled Meds:   stroke: mapping our early stages of recovery book   Does not apply Once   amiodarone  400 mg Per Tube Daily   Followed by   Derrill Memo ON 02/28/2021] amiodarone  200 mg Per Tube Daily   amLODipine  10 mg Per Tube Daily   carvedilol  25 mg Per Tube BID WC   chlorhexidine  15 mL Mouth Rinse BID   enoxaparin (LOVENOX) injection  40 mg Subcutaneous Q24H   free water  100 mL Per Tube Q4H   gabapentin  100 mg Per Tube TID   insulin aspart  3-9 Units Subcutaneous Q4H   insulin detemir  25 Units Subcutaneous BID   losartan  50 mg Per Tube BID   mouth rinse  15 mL Mouth Rinse q12n4p    nutrition supplement (JUVEN)  1 packet Per Tube BID BM   pantoprazole sodium  40 mg Per Tube QHS   senna-docusate  1 tablet Per Tube BID   Continuous Infusions:  feeding supplement (GLUCERNA 1.5 CAL) 1,000 mL (02/26/21 0637)     LOS: 34 days        Hosie Poisson, MD [facility]   To contact the attending provider between 7A-7P or the covering provider during after hours 7P-7A, please log into the web site [email or web address] and access using universal [place] password for that web site. If you do not have the password, please call the hospital operator.  02/26/2021, 10:59 AM

## 2021-02-27 LAB — GLUCOSE, CAPILLARY
Glucose-Capillary: 103 mg/dL — ABNORMAL HIGH (ref 70–99)
Glucose-Capillary: 115 mg/dL — ABNORMAL HIGH (ref 70–99)
Glucose-Capillary: 129 mg/dL — ABNORMAL HIGH (ref 70–99)
Glucose-Capillary: 132 mg/dL — ABNORMAL HIGH (ref 70–99)
Glucose-Capillary: 89 mg/dL (ref 70–99)
Glucose-Capillary: 92 mg/dL (ref 70–99)

## 2021-02-27 LAB — RESP PANEL BY RT-PCR (FLU A&B, COVID) ARPGX2
Influenza A by PCR: NEGATIVE
Influenza B by PCR: NEGATIVE
SARS Coronavirus 2 by RT PCR: NEGATIVE

## 2021-02-27 NOTE — Progress Notes (Addendum)
[facility] PROGRESS NOTE    Progress Note  Brian [name]  ENI:[id number] DOB: [birth year] DOA: 01/23/2021 PCP: [name], MD     Brief Narrative:   Brian [name] is an 85 y.o. male past medical history of atrial fibrillation on Eliquis, uncontrolled diabetes mellitus type 2, essential hypertension left MCA stroke obesity comes into the hospital right-sided weakness facial droop numbness and left gaze CT of the head showed thalamic intracranial hemorrhage with a 6 mm left to right parietal shift patient was on Eliquis for atrial fibrillation for which it was reversed. Neurology was consulted on admission, admitted to the neuro ICU under the stroke service and [facility] 02[facility]2021 failed swallowing evaluation underwent PEG tube placement on 02/08/2021.  He was having low-grade fever with improving leukocytosis, no source of infection found.  EEG showed focal left-sided slowing but no seizures.  Repeated CT scan showed evolving intracranial hemorrhage. Neurology deemed him a poor prognosis. At this time disposition is to skilled nursing facility, awaiting placement.    Assessment/Plan:   ICH (intracerebral hemorrhage) ([place]): With cerebral edema on midline shift. Neurology was consulted they recommended no surgical intervention, to continue conservative management. Follow-up within 4 to 6 weeks. Physical therapy evaluated the patient and the recommended skilled nursing facility. Palliative care was consulted the patient remains a full code.  Plan as for long-term rehab.  Fever and a cough: Now improved chest x-Arvin unremarkable. T-max of 99.1.  Hyper tensive emergency: The infusion along Coreg, losartan and Norvasc.  Chronic atrial fibrillation on Coreg and amiodarone. Not a candidate for anticoagulation due to intracranial hemorrhage.  Aspiration pneumonia: Completed his course of antibiotics.  Dysphagia probably secondary to stroke: Status post PEG placement  02/08/2021 continue tube feeds.  Insulin-dependent diabetes mellitus type 2: Continue sliding scale.  Acute on chronic anemia: Asymptomatic continue to monitor transfuse if less than 7 or symptomatic.  Stage II sacral decubitus ulcer RN Pressure Injury Documentation: Pressure Injury 02/05/21 Buttocks Left Stage 2 -  Partial thickness loss of dermis presenting as a shallow open injury with a red, pink wound bed without slough. blister ruptured on left buttock, serosanguinous drainage (Active)  02/05/21 1613  Location: Buttocks  Location Orientation: Left  Staging: Stage 2 -  Partial thickness loss of dermis presenting as a shallow open injury with a red, pink wound bed without slough.  Wound Description (Comments): blister ruptured on left buttock, serosanguinous drainage  Present on Admission: No     DVT prophylaxis: SCD Family Communication:none Status is: Inpatient  Remains inpatient appropriate because:Hemodynamically unstable  Dispo: The patient is from: Home              Anticipated d/c is to: SNF              Patient currently is medically stable to d/c.   Difficult to place patient No        Code Status:     Code Status Orders  (From admission, onward)           Start     Ordered   01/23/21 1600  Full code  Continuous        01/23/21 1602           Code Status History     Date Active Date Inactive Code Status Order ID Comments User Context   06/22/2018 1856 06/27/2018 1638 Full Code [id number]  Modena Jansky, MD Inpatient   04/01/2018 1453 04/02/2018 2153 Full Code [id number]  Karmen Bongo, MD ED         IV Access:   Peripheral IV   Procedures and diagnostic studies:   No results found.   Medical Consultants:   None.   Subjective:    Brian [name] nonverbal  Objective:    Vitals:   02/26/21 1930 02/26/21 2302 02/27/21 0320 02/27/21 0414  BP: 124/62 (!) 135/94 (!) 156/71   Pulse: (!) 55 62 65   Resp: 16 16 16     Temp: 98.4 F (36.9 C) 99.1 F (37.3 C) 98.9 F (37.2 C)   TempSrc: Oral Oral Oral   SpO2: 100% 100% 100%   Weight:    102.2 kg  Height:       SpO2: 100 % O2 Flow Rate (L/min): 2 L/min FiO2 (%): 36 %   Intake/Output Summary (Last 24 hours) at 02/27/2021 0729 Last data filed at 02/27/2021 0433 Gross per 24 hour  Intake --  Output 1650 ml  Net -1650 ml   Filed Weights   02/25/21 0350 02/26/21 0406 02/27/21 0414  Weight: 102.2 kg 100.2 kg 102.2 kg    Exam: General exam: In no acute distress. Respiratory system: Good air movement and clear to auscultation. Cardiovascular system: S1 & S2 heard, RRR. No JVD. Gastrointestinal system: Abdomen is nondistended, soft and nontender.  Extremities: No pedal edema. Skin: No rashes, lesions or ulcers Psychiatry: Judgement and insight appear normal. Mood & affect appropriate.    Data Reviewed:    Labs: Basic Metabolic Panel: Recent Labs  Lab 02/21/21 0521  NA 138  K 4.5  CL 104  CO2 28  GLUCOSE 110*  BUN 42*  CREATININE 1.00  CALCIUM 9.4   GFR Estimated Creatinine Clearance: 69.2 mL/min (by C-G formula based on SCr of 1 mg/dL). Liver Function Tests: No results for input(s): AST, ALT, ALKPHOS, BILITOT, PROT, ALBUMIN in the last 168 hours. No results for input(s): LIPASE, AMYLASE in the last 168 hours. No results for input(s): AMMONIA in the last 168 hours. Coagulation profile No results for input(s): INR, PROTIME in the last 168 hours. COVID-19 Labs  No results for input(s): DDIMER, FERRITIN, LDH, CRP in the last 72 hours.  Lab Results  Component Value Date   SARSCOV2NAA NEGATIVE 01/23/2021    CBC: No results for input(s): WBC, NEUTROABS, HGB, HCT, MCV, PLT in the last 168 hours. Cardiac Enzymes: No results for input(s): CKTOTAL, CKMB, CKMBINDEX, TROPONINI in the last 168 hours. BNP (last 3 results) No results for input(s): PROBNP in the last 8760 hours. CBG: Recent Labs  Lab 02/26/21 1128 02/26/21 1538  02/26/21 2003 02/26/21 2332 02/27/21 0328  GLUCAP 186* 117* 92 85 103*   D-Dimer: No results for input(s): DDIMER in the last 72 hours. Hgb A1c: No results for input(s): HGBA1C in the last 72 hours. Lipid Profile: No results for input(s): CHOL, HDL, LDLCALC, TRIG, CHOLHDL, LDLDIRECT in the last 72 hours. Thyroid function studies: No results for input(s): TSH, T4TOTAL, T3FREE, THYROIDAB in the last 72 hours.  Invalid input(s): FREET3 Anemia work up: No results for input(s): VITAMINB12, FOLATE, FERRITIN, TIBC, IRON, RETICCTPCT in the last 72 hours. Sepsis Labs: No results for input(s): PROCALCITON, WBC, LATICACIDVEN in the last 168 hours. Microbiology No results found for this or any previous visit (from the past 240 hour(s)).   Medications:     stroke: mapping our early stages of recovery book   Does not apply Once   amiodarone  400 mg Per Tube Daily   Followed by   [  START ON 02/28/2021] amiodarone  200 mg Per Tube Daily   amLODipine  10 mg Per Tube Daily   carvedilol  25 mg Per Tube BID WC   chlorhexidine  15 mL Mouth Rinse BID   enoxaparin (LOVENOX) injection  40 mg Subcutaneous Q24H   free water  100 mL Per Tube Q4H   gabapentin  100 mg Per Tube TID   insulin aspart  3-9 Units Subcutaneous Q4H   insulin detemir  25 Units Subcutaneous BID   losartan  50 mg Per Tube BID   mouth rinse  15 mL Mouth Rinse q12n4p   nutrition supplement (JUVEN)  1 packet Per Tube BID BM   pantoprazole sodium  40 mg Per Tube QHS   senna-docusate  1 tablet Per Tube BID   Continuous Infusions:  feeding supplement (GLUCERNA 1.5 CAL) 1,000 mL (02/26/21 2303)      LOS: 35 days   Charlynne Cousins  [facility]  02/27/2021, 7:29 AM

## 2021-02-27 NOTE — TOC Progression Note (Addendum)
Transition of Care [facility]) - Progression Note    Patient Details  Name: Brian [name] MRN: [id number] Date of Birth: October 13, 1935  Transition of Care [facility]) CM/SW [place], RN Phone Number: 02/27/2021, 9:32 AM  Clinical Narrative:    Case management called and left a voice message with Tressa Busman, [place] at [facility] to determine if patient is able to transfer to the facility for LTC placement.  The patient's son, [name], met with the business office at [facility] on Friday, 02/24/2021 to make financial arrangements required of admission to the facility.  CM and MSW will continue to follow the patient for SNF placement at [facility] the first of this week.  CM is waiting on follow up conversation with the facility this morning.  02/27/2021 1040 - CM spoke with Tressa Busman, [place] at [facility] and the patient will have an available bed a [facility] tomorrow and will be able to transfer by PTAR to the facility.  I informed Dr. Olevia Bowens of the planned admission to the facility and COVID screen will be ordered and completed today.  CM will continue to follow for SNF placement.   Expected Discharge Plan: Long Term Nursing Home Barriers to Discharge: Other (must enter comment) (The patient will need skilled nursing care placement through private funds)  Expected Discharge Plan and Services Expected Discharge Plan: Long Term Nursing Home In-house Referral: Clinical Social Work, Hospice / Palliative Care Discharge Planning Services: CM Consult Post Acute Care Choice: Nursing Home Living arrangements for the past 2 months: Single Family Home                                       Social Determinants of Health (SDOH) Interventions    Readmission Risk Interventions No flowsheet data found.

## 2021-02-27 NOTE — Progress Notes (Signed)
Physical Therapy Treatment Patient Details Name: Brian [name] MRN: [id number] DOB: 10/06/1935 Today's Date: 02/27/2021    History of Present Illness 85 y.o. M who presents with acute onset R sided weakness, facial droop, numbness, aphasia with left gaze preference. CTH showing L thalamic ICH.  S/P peg tube 5/25.Significant PMH: afib, DM2, HLD, HTN, stroke.    PT Comments    Today's skilled session continued to focus on strengthening. Mobility was limited due to pt agitation. Acute PT to continue during pt's hospital stay.    Follow Up Recommendations  SNF     Equipment Recommendations  Wheelchair (measurements PT);Wheelchair cushion (measurements PT);Hospital bed;3in1 (PT)    Recommendations for Other Services       Precautions / Restrictions Precautions Precautions: Fall Precaution Comments: global aphasia, PEG (feeds paused and disconnected during session), combative when awake Restrictions Weight Bearing Restrictions: No    Mobility  Bed Mobility               General bed mobility comments: not perfomed as pt agitated/swinging left UE and no second person assist available        Modified Rankin (Stroke Patients Only) Modified Rankin (Stroke Patients Only) Pre-Morbid Rankin Score: Slight disability Modified Rankin: Severe disability     Cognition Arousal/Alertness: Awake/alert Behavior During Therapy: Flat affect;Restless Overall Cognitive Status: Impaired/Different from baseline Area of Impairment: Following commands;Attention;Awareness;Problem solving                   Current Attention Level: Sustained   Following Commands: Follows one step commands inconsistently   Awareness: Intellectual Problem Solving: Slow processing;Decreased initiation;Difficulty sequencing;Requires verbal cues;Requires tactile cues        Exercises General Exercises - Lower Extremity Ankle Circles/Pumps: PROM;Both;10 reps;Supine Heel Slides: PROM;Both;10  reps;Supine Hip ABduction/ADduction: PROM;Both;10 reps;Supine Straight Leg Raises: PROM;Both;10 reps;Supine     Pertinent Vitals/Pain Pain Assessment: 0-10 Pain Score: 0-No pain     PT Goals (current goals can now be found in the care plan section) Acute Rehab PT Goals Patient Stated Goal: none stated PT Goal Formulation: Patient unable to participate in goal setting Time For Goal Achievement: 03/06/21 Potential to Achieve Goals: Fair Progress towards PT goals: Progressing toward goals    Frequency    Min 2X/week      PT Plan Current plan remains appropriate    AM-PAC PT "6 Clicks" Mobility   Outcome Measure  Help needed turning from your back to your side while in a flat bed without using bedrails?: Total Help needed moving from lying on your back to sitting on the side of a flat bed without using bedrails?: Total Help needed moving to and from a bed to a chair (including a wheelchair)?: Total Help needed standing up from a chair using your arms (e.g., wheelchair or bedside chair)?: Total Help needed to walk in hospital room?: Total Help needed climbing 3-5 steps with a railing? : Total 6 Click Score: 6    End of Session   Activity Tolerance: Patient limited by fatigue;Other (comment) (resistance to mobility, resisted exercises on left LE) Patient left: in bed;with call bell/phone within reach;with bed alarm set;with SCD's reapplied Nurse Communication: Mobility status PT Visit Diagnosis: Other abnormalities of gait and mobility (R26.89);Hemiplegia and hemiparesis Hemiplegia - Right/Left: Right Hemiplegia - dominant/non-dominant: Dominant Hemiplegia - caused by: Nontraumatic intracerebral hemorrhage     Time: 1032-1042 PT Time Calculation (min) (ACUTE ONLY): 10 min  Charges:  $Therapeutic Exercise: 8-22 mins  Willow Ora, PTA, [place] [phone] 02/27/21, 11:12 AM    Willow Ora 02/27/2021, 11:11 AM

## 2021-02-28 DIAGNOSIS — R410 Disorientation, unspecified: Secondary | ICD-10-CM | POA: Diagnosis not present

## 2021-02-28 DIAGNOSIS — Z743 Need for continuous supervision: Secondary | ICD-10-CM | POA: Diagnosis not present

## 2021-02-28 DIAGNOSIS — J690 Pneumonitis due to inhalation of food and vomit: Secondary | ICD-10-CM

## 2021-02-28 DIAGNOSIS — G459 Transient cerebral ischemic attack, unspecified: Secondary | ICD-10-CM | POA: Diagnosis not present

## 2021-02-28 DIAGNOSIS — I629 Nontraumatic intracranial hemorrhage, unspecified: Secondary | ICD-10-CM

## 2021-02-28 DIAGNOSIS — D72825 Bandemia: Secondary | ICD-10-CM

## 2021-02-28 LAB — GLUCOSE, CAPILLARY
Glucose-Capillary: 127 mg/dL — ABNORMAL HIGH (ref 70–99)
Glucose-Capillary: 70 mg/dL (ref 70–99)
Glucose-Capillary: 97 mg/dL (ref 70–99)
Glucose-Capillary: 93 mg/dL (ref 70–99)

## 2021-02-28 MED ORDER — FREE WATER: 100.0000 mL | Status: AC

## 2021-02-28 MED ORDER — INSULIN ASPART 100 UNIT/ML IJ SOLN: 3.0000 [IU] | Freq: Three times a day (TID) | INTRAMUSCULAR | 11 refills | Status: DC

## 2021-02-28 MED ORDER — INSULIN DETEMIR 100 UNIT/ML ~~LOC~~ SOLN: 15.0000 [IU] | Freq: Two times a day (BID) | SUBCUTANEOUS | 11 refills | Status: DC

## 2021-02-28 MED ORDER — AMIODARONE HCL 200 MG PO TABS: 200.0000 mg | ORAL_TABLET | Freq: Every day | ORAL | Status: AC

## 2021-02-28 MED ORDER — SITAGLIPTIN PHOSPHATE 100 MG PO TABS: 100.0000 mg | ORAL_TABLET | Freq: Every day | ORAL | 3 refills | Status: DC

## 2021-02-28 NOTE — TOC Transition Note (Signed)
Transition of Care [facility]) - CM/SW Discharge Note   Patient Details  Name: Brian [name] MRN: [id number] Date of Birth: 1936/05/17  Transition of Care [facility]) CM/SW Contact:  Brian Labrum, RN Phone Number: 02/28/2021, 9:50 AM   Clinical Narrative:    Case management called and spoke with the patient's son this morning and patient will be discharging to [facility] today via [place].  I called and spoke with Brian [name], [place] at [place] and he plans to speak with the son regarding [employer] Medicare's agreement to approve patient for rehab placement and then private pay afterwards.  I uploaded discharge summary into the hub for [place] and arranged PTAR transport for 1100 today.  PTAR packet was given to the secretary.  I spoke with [place], RN and bedside and she will be able to call nursing report to [facility] at [phone].   Final next level of care: Long Term Nursing Home Barriers to Discharge: Other (must enter comment) (The patient will need skilled nursing care placement through private funds)   Patient Goals and CMS Choice Patient states their goals for this hospitalization and ongoing recovery are:: The patient's son is agreeable to private pay skilled nursing home placement - pending conversation with son on the phone. CMS Medicare.gov Compare Post Acute Care list provided to:: Patient Represenative (must comment) Brian [name], MSW left message with son, Brian [name].) Choice offered to / list presented to : Adult Children  Discharge Placement                       Discharge Plan and Services In-house Referral: Clinical Social Work, Hospice / Palliative Care Discharge Planning Services: CM Consult Post Acute Care Choice: Nursing Home                               Social Determinants of Health (SDOH) Interventions     Readmission Risk Interventions No flowsheet data found.

## 2021-02-28 NOTE — Discharge Summary (Signed)
Physician Discharge Summary  Brian [name] OYD:[id number] DOB: Nov 16, 1935 DOA: 01/23/2021  PCP: [name], MD  Admit date: 01/23/2021 Discharge date: 02/28/2021  Admitted From: Home Disposition:  SNF  Recommendations for Outpatient Follow-up:  Follow up with Neurologyin 4-6 weeks Please obtain BMP/CBC in one week Palliative care to be with family at facility to keep discussing end-of-life with discussion of DNR and poor prognosis   Home Health:No Equipment/Devices:None  Discharge Condition:Stable CODE STATUS:Full Diet recommendation: Heart Healthy   Brief/Interim Summary: 85 y.o. male past medical history of atrial fibrillation on Eliquis, uncontrolled diabetes mellitus type 2, essential hypertension left MCA stroke obesity comes into the hospital right-sided weakness facial droop numbness and left gaze CT of the head showed thalamic intracranial hemorrhage with a 6 mm left to right parietal shift patient was on Eliquis for atrial fibrillation for which it was reversed. Neurology was consulted on admission, admitted to the neuro ICU under the stroke service and transferred to [facility]ad on 02/01/2021 failed swallowing evaluation underwent PEG tube placement on 02/08/2021.  He was having low-grade fever with improving leukocytosis, no source of infection found.  EEG showed focal left-sided slowing but no seizures.  Repeated CT scan showed evolving intracranial hemorrhage. Neurology deemed him a poor prognosis.  Discharge Diagnoses:  Active Problems:   Hypertensive crisis   ICH (intracerebral hemorrhage) (HCC)   SVT (supraventricular tachycardia) (HCC)   Pressure injury of skin  ICH: With cerebral edema and midline shift.  Consulted they recommended no surgical intervention recommended to continue conservative management ,repeated CT scan showed stable intracranial hemorrhage, neurology also recommended to follow-up with them in 4 to 6 weeks.  Fever and cough: Now resolved chest x-Powell was  unremarkable he remained afebrile.  Hypertensive emergency: He was treated with IV infusion, then transition to Coreg losartan and Norvasc his blood pressure and been relatively controlled.  Chronic atrial fibrillation: Continue Coreg amiodarone no candidate for anticoagulation due to intracranial hemorrhage.  Aspiration pneumonia: He completed his course of IV antibiotics in house.  Dysphagia probably secondary to stroke: Discussed with the family that this would not change his prognosis due to status.  Placed back on 02/08/2021 he tolerated tube feedings.  Insulin-dependent diabetes mellitus type 2: He was continue on Januvia and metformin long-acting insulin with 3 units of NovoLog today.    Acute on chronic anemia: Remained asymptomatic, hemoglobin remained greater than 8 , further work-up as an outpatient.  Discharge Instructions  Discharge Instructions     Ambulatory referral to Neurology   Complete by: As directed    Follow up with Dr. Leonie Man at [facility] in 4-6 weeks. Too complicated for RN to follow. Thanks.   Diet - low sodium heart healthy   Complete by: As directed    Increase activity slowly   Complete by: As directed    No wound care   Complete by: As directed       Allergies as of 02/28/2021   No Known Allergies      Medication List     STOP taking these medications    aspirin 81 MG EC tablet   Basaglar KwikPen 100 UNIT/ML   Eliquis 5 MG Tabs tablet Generic drug: apixaban       TAKE these medications    acetaminophen 325 MG tablet Commonly known as: TYLENOL Take 2 tablets (650 mg total) by mouth every 4 (four) hours as needed for mild pain or fever (or temp > 37.5 C (99.5 F)).   amiodarone 200 MG  tablet Commonly known as: PACERONE Place 1 tablet (200 mg total) into feeding tube daily.   amLODipine 10 MG tablet Commonly known as: NORVASC TAKE 1 TABLET BY MOUTH EVERY DAY   atorvastatin 80 MG tablet Commonly known as: LIPITOR TAKE 1 TABLET BY  MOUTH EVERY DAY IN THE EVENING AT 6PM What changed: See the new instructions.   carvedilol 12.5 MG tablet Commonly known as: COREG Take 1 tablet (12.5 mg total) by mouth 2 (two) times daily with a meal.   free water Soln Place 100 mLs into feeding tube every 4 (four) hours.   gabapentin 100 MG capsule Commonly known as: NEURONTIN TAKE 1 CAPSULE BY MOUTH THREE TIMES A DAY What changed: See the new instructions.   insulin aspart 100 UNIT/ML injection Commonly known as: novoLOG Inject 3 Units into the skin 3 (three) times daily. Hold for blood glucose less than 160.   insulin detemir 100 UNIT/ML injection Commonly known as: LEVEMIR Inject 0.15 mLs (15 Units total) into the skin 2 (two) times daily.   Insulin Pen Needle 32G X 4 MM Misc Use 1x a day   Klor-Con M20 20 MEQ tablet Generic drug: potassium chloride SA TAKE 1 TABLET (20 MEQ TOTAL) BY MOUTH 2 (TWO) TIMES A WEEK. What changed: See the new instructions.   Lancets Misc Use as directed twice daily E11.9   losartan 100 MG tablet Commonly known as: COZAAR TAKE 1 TABLET BY MOUTH EVERY DAY   metFORMIN 1000 MG tablet Commonly known as: GLUCOPHAGE Take 1 tablet (1,000 mg total) by mouth 2 (two) times daily with a meal.   OneTouch Verio test strip Generic drug: glucose blood USE AS DIRECTED TWICE DAILY E11.9 What changed: See the new instructions.   OneTouch Verio w/Device Kit Use as directed twice daily E11.9   OVER THE COUNTER MEDICATION Place 1 drop into both eyes daily as needed (dry eyes). Over the counter lubricating eye drop   sitaGLIPtin 100 MG tablet Commonly known as: Januvia Place 1 tablet (100 mg total) into feeding tube daily. What changed: how to take this        Follow-up Information     Garvin Fila, MD. Schedule an appointment as soon as possible for a visit in 4 week(s).   Specialties: Neurology, Radiology Contact information: [address] [place]  [id number] [phone]                No Known Allergies  Consultations: Neurology Palliative care   Procedures/Studies: CT ABDOMEN WO CONTRAST  Result Date: 02/06/2021 CLINICAL DATA:  Evaluate anatomy for G-tube placement EXAM: CT ABDOMEN WITHOUT CONTRAST TECHNIQUE: Multidetector CT imaging of the abdomen was performed following the standard protocol without IV contrast. COMPARISON:  None. FINDINGS: Lower chest: No acute abnormality Hepatobiliary: No focal liver abnormality is seen. No gallstones, gallbladder wall thickening, or biliary dilatation. Pancreas: Pancreas is diffusely atrophic. Spleen: Normal in size without focal abnormality. Adrenals/Urinary Tract: Adrenal glands are normal. 1.4 cm nonobstructing calculus seen in the lower pole of the right kidney. The kidneys in visualized ureteral segment otherwise unremarkable. Stomach/Bowel: Nasogastric tube terminates within the gastric lumen. There is a calcified mass along the posterior fundal wall measuring 2.0 x 1.5 x 1.8 cm. Visualized small and large bowel normal in caliber and distribution. Vascular/Lymphatic: No enlarged retroperitoneal or or mesenteric lymph nodes. Scattered atheromatous plaque of the abdominal aorta and visualized branches. Other: No abdominal wall hernia or abnormality. Musculoskeletal: No acute or significant osseous findings. IMPRESSION:  1. Gastric anatomy is amenable to percutaneous gastrostomy tube placement. 2. 2.0 x 1.8 x 1.5 cm high density structure noted along the posterior gastric fundus may represent a diverticulum containing oral contrast or alternatively could represent a calcified gastric neoplasm such as GIST. Follow-up contrast enhance abdominal CT should be performed in 2-3 months to again evaluate this structure. 3. 1.4 cm nonobstructing calculus seen in the lower pole the right kidney. Electronically Signed   By: Miachel Roux M.D.   On: 02/06/2021 16:18   CT HEAD WO CONTRAST  Result Date:  02/01/2021 CLINICAL DATA:  85 year old male with left thalamic hemorrhage presenting on 01/23/2021. EXAM: CT HEAD WITHOUT CONTRAST TECHNIQUE: Contiguous axial images were obtained from the base of the skull through the vertex without intravenous contrast. COMPARISON:  Head CTs 01/30/2021 and earlier. FINDINGS: Brain: Rounded hyperdense hemorrhage centered at the left thalamus is 28 x 29 x 26 mm (AP by transverse by CC) now for an estimated blood volume of 11 mL, mildly decreased compared to 01/23/2021 (16 mL estimated at that time). Surrounding edema and mild regional mass effect remain stable. Small volume of intraventricular extension of blood is stable along with ventricle size and configuration. No new intracranial hemorrhage. No significant midline shift. Basilar cisterns remain normal. Stable gray-white matter differentiation throughout the brain. Chronic bilateral white matter disease, chronic right cerebellar infarcts. Vascular: Calcified atherosclerosis at the skull base. Skull: No acute osseous abnormality identified. Sinuses/Orbits: Visualized paranasal sinuses and mastoids are stable and well aerated. Other: Right nasoenteric tube remains in place. Stable orbit and scalp soft tissues. IMPRESSION: 1. Slowly evolving left thalamic hemorrhage, mildly decreased in volume since 01/23/2021. Stable small volume IVH, regional edema and mass effect. 2. No new intracranial abnormality. Electronically Signed   By: Genevie Ann M.D.   On: 02/01/2021 08:06   CT HEAD WO CONTRAST  Result Date: 01/30/2021 CLINICAL DATA:  Intracranial hemorrhage EXAM: CT HEAD WITHOUT CONTRAST TECHNIQUE: Contiguous axial images were obtained from the base of the skull through the vertex without intravenous contrast. COMPARISON:  01/29/2021 FINDINGS: Brain: Left thalamic hemorrhage unchanged measuring 28 mm in diameter. Intraventricular hemorrhage unchanged. Negative for hydrocephalus Generalized atrophy with moderate white matter  hypodensity diffusely unchanged. No acute ischemic infarct. No midline shift. Vascular: Negative for hyperdense vessel Skull: Negative Sinuses/Orbits: Mild mucosal edema paranasal sinuses. NG tube in place. Other: None IMPRESSION: Stable head CT. Left thalamic hemorrhage and intraventricular hemorrhage unchanged. Negative for hydrocephalus. Electronically Signed   By: Franchot Gallo M.D.   On: 01/30/2021 08:30   CT HEAD WO CONTRAST  Result Date: 01/29/2021 CLINICAL DATA:  Cerebral hemorrhage suspected EXAM: CT HEAD WITHOUT CONTRAST TECHNIQUE: Contiguous axial images were obtained from the base of the skull through the vertex without intravenous contrast. COMPARISON:  Two days ago FINDINGS: Brain: Acute hematoma centered in the left thalamus and adjacent white matter, 3.1 cm in maximal span on axial images, unchanged when remeasured in a similar fashion. Intraventricular hemorrhage seen at the lateral ventricles with stable mild ventriculomegaly. Chronic small vessel ischemic type low-density in the hemispheric white matter. No evidence of acute cortical infarct. Small remote right cerebellar infarct. Regional mass effect from the hematoma is unchanged. Vascular: Atheromatous calcification Skull: Negative Sinuses/Orbits: Nasal intubation. IMPRESSION: 1. No progression of the left thalamic or intraventricular hemorrhage. 2. Unchanged ventriculomegaly. Electronically Signed   By: Monte Fantasia M.D.   On: 01/29/2021 10:21   IR GASTROSTOMY TUBE MOD SED  Result Date: 02/08/2021 INDICATION: History of thalamic hemorrhage now  with dysphagia. Please perform percutaneous gastrostomy tube placement for enteric nutrition supplementation purposes. EXAM: PULL TROUGH GASTROSTOMY TUBE PLACEMENT COMPARISON:  Abdominal CT-02/06/2021 MEDICATIONS: Ancef 2 gm IV; Antibiotics were administered within 1 hour of the procedure. Glucagon 1 mg IV CONTRAST:  None - air was utilized as a contrast agent given current contrast shortage  an adequate fluoroscopic visualization ANESTHESIA/SEDATION: Moderate (conscious) sedation was employed during this procedure. A total of Versed 1 mg and Fentanyl 50 mcg was administered intravenously. Moderate Sedation Time: 22 minutes. The patient's level of consciousness and vital signs were monitored continuously by radiology nursing throughout the procedure under my direct supervision. FLUOROSCOPY TIME:  8 minutes, 36 seconds (83 mGy) COMPLICATIONS: None immediate. PROCEDURE: Informed written consent was obtained from the patient's family following explanation of the procedure, risks, benefits and alternatives. A time out was performed prior to the initiation of the procedure. Ultrasound scanning was performed to demarcate the edge of the left lobe of the liver. Maximal barrier sterile technique utilized including caps, mask, sterile gowns, sterile gloves, large sterile drape, hand hygiene and Betadine prep. The left upper quadrant was sterilely prepped and draped. An oral gastric catheter was inserted into the stomach under fluoroscopy. The existing nasogastric feeding tube was removed. The left costal margin and air opacified transverse colon were identified and avoided. Air was injected into the stomach for insufflation and visualization under fluoroscopy. Under sterile conditions a 17 gauge trocar needle was utilized to access the stomach percutaneously beneath the left subcostal margin after the overlying soft tissues were anesthetized with 1% Lidocaine with epinephrine. Needle position was confirmed within the stomach with aspiration of air. A single T tack was deployed for gastropexy. Over an Amplatz guide wire, a 9-French sheath was inserted into the stomach. A snare device was utilized to capture the oral gastric catheter. The snare device was pulled retrograde from the stomach up the esophagus and out the oropharynx. The 20-French pull-through gastrostomy was connected to the snare device and pulled  antegrade through the oropharynx down the esophagus into the stomach and then through the percutaneous tract external to the patient. The gastrostomy was assembled externally. Air injection confirms appropriate positioning within the stomach. Several spot radiographic images were obtained in various obliquities for documentation. The patient tolerated procedure well without immediate post procedural complication. FINDINGS: After successful fluoroscopic guided placement, the gastrostomy tube is appropriately positioned with internal disc against the ventral aspect of the gastric lumen. IMPRESSION: Successful fluoroscopic insertion of a 20-French pull-through gastrostomy tube. The gastrostomy may be used immediately for medication administration and in 24 hrs for the initiation of feeds. Electronically Signed   By: Sandi Mariscal M.D.   On: 02/08/2021 09:50   DG ABDOMEN PEG TUBE LOCATION  Result Date: 02/13/2021 CLINICAL DATA:  Peg tube placement EXAM: ABDOMEN - 1 VIEW COMPARISON:  None. FINDINGS: Contrast is hand injected through the PEG tube opacifying the stomach. Peg tube is in satisfactory position. No extraluminal contrast to suggest extravasation. No bowel dilatation to suggest obstruction. No evidence of pneumoperitoneum, portal venous gas or pneumatosis. No pathologic calcifications along the expected course of the ureters. No acute osseous abnormality. IMPRESSION: Peg tube is in satisfactory position. No extraluminal contrast to suggest extravasation. Electronically Signed   By: Kathreen Devoid   On: 02/13/2021 15:22   DG CHEST PORT 1 VIEW  Result Date: 02/11/2021 CLINICAL DATA:  Fever and cough. EXAM: PORTABLE CHEST 1 VIEW COMPARISON:  02/03/2021. FINDINGS: Stable cardiac silhouette.  No mediastinal or hilar  masses. Prominent bronchovascular markings most evident at the bases. Subtle opacity at the lateral left lung base is similar to the prior exam, likely due to atelectasis, accentuated by the patient  rotation. No convincing pneumonia. No evidence of pulmonary edema. No pleural effusion or pneumothorax. IMPRESSION: 1. No acute cardiopulmonary disease. Electronically Signed   By: Lajean Manes M.D.   On: 02/11/2021 09:35   DG CHEST PORT 1 VIEW  Result Date: 02/03/2021 CLINICAL DATA:  Shortness of breath EXAM: PORTABLE CHEST 1 VIEW COMPARISON:  Jan 30, 2021 FINDINGS: Enteric tube tip is below the diaphragm, not seen on current examination. There is slight left base atelectasis. Lungs elsewhere are clear. Heart is upper normal in size with pulmonary vascularity normal. No adenopathy. There is aortic atherosclerosis. No bone lesions. IMPRESSION: Enteric tube tip below diaphragm. Atelectasis left base. Earliest changes of pneumonia in this area questioned. Lungs elsewhere clear. Stable cardiac silhouette. Aortic Atherosclerosis (ICD10-I70.0). Electronically Signed   By: Lowella Grip III M.D.   On: 02/03/2021 11:35   DG CHEST PORT 1 VIEW  Result Date: 01/30/2021 CLINICAL DATA:  Leukocytosis. EXAM: PORTABLE CHEST 1 VIEW COMPARISON:  Jan 27, 2021. FINDINGS: The heart size and mediastinal contours are within normal limits. Distal tip of feeding tube is seen in proximal stomach. Both lungs are clear. The visualized skeletal structures are unremarkable. IMPRESSION: No active disease. Electronically Signed   By: Marijo Conception M.D.   On: 01/30/2021 11:38   DG Abd Portable 1V  Result Date: 02/06/2021 CLINICAL DATA:  Intracranial hemorrhage and assessment for possible gastrostomy tube placement. EXAM: PORTABLE ABDOMEN - 1 VIEW COMPARISON:  Prior abdominal film at [facility] dated 11/08/2011 FINDINGS: High position decompressed stomach contains a feeding tube. Difficult to exclude component of hiatal hernia. Mild gaseous prominence of a redundant colon with transverse colon and splenic flexure located very near the decompressed stomach. No evidence of small-bowel obstruction. Calcification in the right  abdomen was present previously and likely represents a lower pole renal calculus. Stable advanced degenerative disease of the lower lumbosacral spine. IMPRESSION: High position decompressed stomach containing feeding tube. Difficult to exclude component of hiatal hernia. Redundant colon demonstrates mild gaseous prominence with the transverse colon and splenic flexure located near the decompressed stomach. Prior to considering percutaneous versus surgical gastrostomy, a CT of the abdomen would be helpful to better evaluate anatomy. Electronically Signed   By: Aletta Edouard M.D.   On: 02/06/2021 08:51   DG Swallowing Func-Speech Pathology  Result Date: 02/02/2021 Objective Swallowing Evaluation: Type of Study: MBS-Modified Barium Swallow Study  Patient Details Name: KELLY EISLER MRN: [id number] Date of Birth: [birth year] Today's Date: 02/02/2021 Time: SLP Start Time (ACUTE ONLY): 0930 -SLP Stop Time (ACUTE ONLY): 5883 SLP Time Calculation (min) (ACUTE ONLY): 15 min Past Medical History: Past Medical History: Diagnosis Date  ALLERGIC RHINITIS 10/13/2007  ANEMIA, CHRONIC DISEASE NEC 03/31/2007  BENIGN PROSTATIC HYPERTROPHY 03/31/2007  Chest pain   Stress echo, normal, December, 2012  Chronic anemia   DIABETES MELLITUS, TYPE II 03/31/2007  [place] DISEASE, LUMBAR 03/31/2007  ED (erectile dysfunction)   Ejection fraction   EF  normal, stress echo, December,  2012  History of prostatitis   HYPERLIPIDEMIA 03/31/2007  HYPERTENSION 03/31/2007  Nephrolithiasis 12/07/2010  Obesity   POLYP, ANAL AND RECTAL 03/31/2007  Right eye trauma   hx as child  Stroke [facility])  Past Surgical History: Past Surgical History: Procedure Laterality Date  LUMBAR [place]  s/p  RECTAL POLYPECTOMY    Transanal excision 10/2005  SKIN BIOPSY    s/p right upper back 2009- benign Dr. Tonia Brooms HPI: Pt is a 85 y.o. M who presents with acute onset R sided weakness, facial droop, numbness, aphasia with left gaze preference. CTH showing L  thalamic ICH. Significant PMH: afib, DM2, HLD, HTN, stroke  No data recorded Assessment / Plan / Recommendation CHL IP CLINICAL IMPRESSIONS 02/02/2021 Clinical Impression Pt presents with oropharyngeal dysphagia, with cognitive and sensorimotor component. Swallow function is marked by anterior spillage of liquids, weak lingual manipulation of bolus, reduced tongue base retraction and epiglottic inversion,  resulting in buildup of pharyngeal residuals and silent aspiration of liquids. Thin liquids silently aspirated (PAS 8) with pt demontrating delayed cough as testing progressed.  NTL penetrated to cords, with no attempt to eject (PAS 5). While no aspiration or pentration noted with HTL, buildup of pharyngeal residuals entered laryngeal vestibule and fell below vocal cords without response from pt. Pureed and mechanical soft solids increased pharyngeal residuals, but no aspiration noted, however cannot rule out possibility of aspiration of residuals as noted previously. Recommend pt continue NPO with alternative means of nutrition via Cortrak. Pt may have ice chips after thorough oral care. SLP to f/u for therapeutic trials and determine readiness for PO diet. Recommendations discussed with pt and RN. SLP Visit Diagnosis Dysphagia, oropharyngeal phase (R13.12) Attention and concentration deficit following -- Frontal lobe and executive function deficit following -- Impact on safety and function Moderate aspiration risk   CHL IP TREATMENT RECOMMENDATION 02/02/2021 Treatment Recommendations Therapy as outlined in treatment plan below   Prognosis 02/02/2021 Prognosis for Safe Diet Advancement Fair Barriers to Reach Goals Cognitive deficits;Language deficits;Severity of deficits Barriers/Prognosis Comment -- CHL IP DIET RECOMMENDATION 02/02/2021 SLP Diet Recommendations NPO;Ice chips PRN after oral care Liquid Administration via -- Medication Administration Via alternative means Compensations -- Postural Changes Seated  upright at 90 degrees   CHL IP OTHER RECOMMENDATIONS 02/02/2021 Recommended Consults -- Oral Care Recommendations Oral care QID Other Recommendations --   CHL IP FOLLOW UP RECOMMENDATIONS 02/02/2021 Follow up Recommendations Skilled Nursing facility   [facility] IP FREQUENCY AND DURATION 02/02/2021 Speech Therapy Frequency (ACUTE ONLY) min 2x/week Treatment Duration 2 weeks      CHL IP ORAL PHASE 02/02/2021 Oral Phase Impaired Oral - Pudding Teaspoon -- Oral - Pudding Cup -- Oral - Honey Teaspoon NT Oral - Honey Cup Right anterior bolus loss;Weak lingual manipulation;Decreased bolus cohesion Oral - Nectar Teaspoon NT Oral - Nectar Cup Right anterior bolus loss;Weak lingual manipulation;Decreased bolus cohesion Oral - Nectar Straw NT Oral - Thin Teaspoon NT Oral - Thin Cup Right anterior bolus loss;Weak lingual manipulation;Decreased bolus cohesion Oral - Thin Straw NT Oral - Puree Weak lingual manipulation;Decreased bolus cohesion Oral - Mech Soft Weak lingual manipulation;Decreased bolus cohesion Oral - Regular NT Oral - Multi-Consistency NT Oral - Pill Weak lingual manipulation;Decreased bolus cohesion Oral Phase - Comment --  CHL IP PHARYNGEAL PHASE 02/02/2021 Pharyngeal Phase Impaired Pharyngeal- Pudding Teaspoon -- Pharyngeal -- Pharyngeal- Pudding Cup -- Pharyngeal -- Pharyngeal- Honey Teaspoon NT Pharyngeal -- Pharyngeal- Honey Cup Reduced pharyngeal peristalsis;Reduced epiglottic inversion;Reduced airway/laryngeal closure;Pharyngeal residue - valleculae;Pharyngeal residue - posterior pharnyx;Reduced tongue base retraction Pharyngeal -- Pharyngeal- Nectar Teaspoon NT Pharyngeal -- Pharyngeal- Nectar Cup Reduced pharyngeal peristalsis;Reduced epiglottic inversion;Reduced airway/laryngeal closure;Pharyngeal residue - valleculae;Pharyngeal residue - posterior pharnyx;Reduced tongue base retraction;Penetration/Aspiration during swallow;Penetration/Apiration after swallow Pharyngeal Material enters airway, CONTACTS cords and  then ejected out;Material enters airway, passes BELOW cords  without attempt by patient to eject out (silent aspiration);Material enters airway, passes BELOW cords and not ejected out despite cough attempt by patient Pharyngeal- Nectar Straw NT Pharyngeal -- Pharyngeal- Thin Teaspoon NT Pharyngeal -- Pharyngeal- Thin Cup Reduced pharyngeal peristalsis;Reduced epiglottic inversion;Reduced airway/laryngeal closure;Pharyngeal residue - valleculae;Pharyngeal residue - posterior pharnyx;Reduced tongue base retraction;Penetration/Aspiration during swallow Pharyngeal Material enters airway, passes BELOW cords without attempt by patient to eject out (silent aspiration) Pharyngeal- Thin Straw NT Pharyngeal -- Pharyngeal- Puree Reduced epiglottic inversion;Reduced pharyngeal peristalsis;Pharyngeal residue - valleculae;Pharyngeal residue - pyriform Pharyngeal -- Pharyngeal- Mechanical Soft Reduced epiglottic inversion;Reduced pharyngeal peristalsis;Pharyngeal residue - valleculae;Pharyngeal residue - pyriform;Reduced tongue base retraction Pharyngeal -- Pharyngeal- Regular NT Pharyngeal -- Pharyngeal- Multi-consistency NT Pharyngeal -- Pharyngeal- Pill Reduced epiglottic inversion;Reduced pharyngeal peristalsis;Pharyngeal residue - valleculae;Pharyngeal residue - pyriform;Compensatory strategies attempted (with notebox) Pharyngeal -- Pharyngeal Comment --  CHL IP CERVICAL ESOPHAGEAL PHASE 02/02/2021 Cervical Esophageal Phase WFL Pudding Teaspoon -- Pudding Cup -- Honey Teaspoon -- Honey Cup -- Nectar Teaspoon -- Nectar Cup -- Nectar Straw -- Thin Teaspoon -- Thin Cup -- Thin Straw -- Puree -- Mechanical Soft -- Regular -- Multi-consistency -- Pill -- Cervical Esophageal Comment -- Ellwood Dense, MA, CCC-SLP [facility] Office Number: [phone] [name] 02/02/2021, 11:54 AM              VAS US CAROTID  Result Date: 02/01/2021 Carotid Arterial Duplex Study Patient Name:  TAHER VANNOTE  Date of  Exam:   01/30/2021 Medical Rec #: [id number]      Accession #:    [id number] Date of Birth: 29-Jan-1936      Patient Gender: M Patient Age:   85Y Exam Location:  [facility] Procedure:      VAS US CAROTID Referring Phys: [id number] Rosalin Hawking --------------------------------------------------------------------------------  Indications:       CVA. Risk Factors:      Hypertension, hyperlipidemia, Diabetes, prior CVA. Comparison Study:  no prior Performing Technologist: Abram Sander RVS  Examination Guidelines: A complete evaluation includes B-mode imaging, spectral Doppler, color Doppler, and power Doppler as needed of all accessible portions of each vessel. Bilateral testing is considered an integral part of a complete examination. Limited examinations for reoccurring indications may be performed as noted.  Right Carotid Findings: +----------+--------+--------+--------+------------------+--------+           PSV cm/sEDV cm/sStenosisPlaque DescriptionComments +----------+--------+--------+--------+------------------+--------+ CCA Prox  92      10              heterogenous               +----------+--------+--------+--------+------------------+--------+ CCA Distal80      16              heterogenous               +----------+--------+--------+--------+------------------+--------+ ICA Prox  59      15      1-39%   heterogenous               +----------+--------+--------+--------+------------------+--------+ ICA Distal86      19                                         +----------+--------+--------+--------+------------------+--------+ ECA       99                                                 +----------+--------+--------+--------+------------------+--------+ +----------+--------+-------+--------+-------------------+  PSV cm/sEDV cmsDescribeArm Pressure (mmHG) +----------+--------+-------+--------+-------------------+ Subclavian109                                         +----------+--------+-------+--------+-------------------+ +---------+--------+--+--------+--+---------+ VertebralPSV cm/s58EDV cm/s12Antegrade +---------+--------+--+--------+--+---------+  Left Carotid Findings: +----------+--------+--------+--------+------------------+--------+           PSV cm/sEDV cm/sStenosisPlaque DescriptionComments +----------+--------+--------+--------+------------------+--------+ CCA Prox  98      11              heterogenous               +----------+--------+--------+--------+------------------+--------+ CCA Distal64      12              heterogenous               +----------+--------+--------+--------+------------------+--------+ ICA Prox  55      13      1-39%   heterogenous               +----------+--------+--------+--------+------------------+--------+ ICA Distal62      13                                         +----------+--------+--------+--------+------------------+--------+ ECA       81                                                 +----------+--------+--------+--------+------------------+--------+ +----------+--------+--------+--------+-------------------+           PSV cm/sEDV cm/sDescribeArm Pressure (mmHG) +----------+--------+--------+--------+-------------------+ [id number]                                          +----------+--------+--------+--------+-------------------+ +---------+--------+--+--------+--+---------+ VertebralPSV cm/s53EDV cm/s10Antegrade +---------+--------+--+--------+--+---------+   Summary: Right Carotid: Velocities in the right ICA are consistent with a 1-39% stenosis. Left Carotid: Velocities in the left ICA are consistent with a 1-39% stenosis. Vertebrals: Bilateral vertebral arteries demonstrate antegrade flow. *See table(s) above for measurements and observations.  Electronically signed by Antony Contras MD on 02/01/2021 at 8:15:25 AM.    Final    (Echo,  Carotid, EGD, Colonoscopy, ERCP)    Subjective: No complaints  Discharge Exam: Vitals:   02/28/21 0404 02/28/21 0737  BP: (!) 147/64 (!) 160/73  Pulse: 62 64  Resp: 18 16  Temp: 98.9 F (37.2 C) 98.8 F (37.1 C)  SpO2: 97% 97%   Vitals:   02/28/21 0404 02/28/21 0404 02/28/21 0439 02/28/21 0737  BP: (!) 147/64 (!) 147/64  (!) 160/73  Pulse: 62 62  64  Resp: _0 Temp: 98.9 F (37.2 C) 98.9 F (37.2 C)  98.8 F (37.1 C)  TempSrc: Axillary Oral  Oral  SpO2: 97% 97%  97%  Weight:   101.8 kg   Height:        General: Pt is alert, awake, not in acute distress Cardiovascular: RRR, S1/S2 +, no rubs, no gallops Respiratory: CTA bilaterally, no wheezing, no rhonchi Abdominal: Soft, NT, ND, bowel sounds + Extremities: no edema, no cyanosis    The results of significant diagnostics from this hospitalization (including imaging, microbiology, ancillary and laboratory) are listed below for  reference.     Microbiology: Recent Results (from the past 240 hour(s))  Resp Panel by RT-PCR (Flu A&B, Covid) Nasopharyngeal Swab     Status: None   Collection Time: 02/27/21  2:00 PM   Specimen: Nasopharyngeal Swab; Nasopharyngeal(NP) swabs in vial transport medium  Result Value Ref Range Status   SARS Coronavirus 2 by RT PCR NEGATIVE NEGATIVE Final    Comment: (NOTE) SARS-CoV-2 target nucleic acids are NOT DETECTED.  The SARS-CoV-2 RNA is generally detectable in upper respiratory specimens during the acute phase of infection. The lowest concentration of SARS-CoV-2 viral copies this assay can detect is 138 copies/mL. A negative result does not preclude SARS-Cov-2 infection and should not be used as the sole basis for treatment or other patient management decisions. A negative result may occur with  improper specimen collection/handling, submission of specimen other than nasopharyngeal swab, presence of viral mutation(s) within the areas targeted by this assay, and inadequate  number of viral copies(<138 copies/mL). A negative result must be combined with clinical observations, patient history, and epidemiological information. The expected result is Negative.  Fact Sheet for Patients:  EntrepreneurPulse.com.au  Fact Sheet for Healthcare Providers:  IncredibleEmployment.be  This test is no t yet approved or cleared by the [place] FDA and  has been authorized for detection and/or diagnosis of SARS-CoV-2 by FDA under an Emergency Use Authorization (EUA). This EUA will remain  in effect (meaning this test can be used) for the duration of the COVID-19 declaration under Section 564(b)(1) of the Act, 21 U.S.C.section 360bbb-3(b)(1), unless the authorization is terminated  or revoked sooner.       Influenza A by PCR NEGATIVE NEGATIVE Final   Influenza B by PCR NEGATIVE NEGATIVE Final    Comment: (NOTE) The Xpert Xpress SARS-CoV-2/FLU/RSV plus assay is intended as an aid in the diagnosis of influenza from Nasopharyngeal swab specimens and should not be used as a sole basis for treatment. Nasal washings and aspirates are unacceptable for Xpert Xpress SARS-CoV-2/FLU/RSV testing.  Fact Sheet for Patients: EntrepreneurPulse.com.au  Fact Sheet for Healthcare Providers: IncredibleEmployment.be  This test is not yet approved or cleared by the [place] FDA and has been authorized for detection and/or diagnosis of SARS-CoV-2 by FDA under an Emergency Use Authorization (EUA). This EUA will remain in effect (meaning this test can be used) for the duration of the COVID-19 declaration under Section 564(b)(1) of the Act, 21 U.S.C. section 360bbb-3(b)(1), unless the authorization is terminated or revoked.  Performed at [facility], [place] [address], [place], [place] [address]      Labs: BNP (last 3 results) No results for input(s): BNP in the last 8760 hours. Basic Metabolic  Panel: No results for input(s): NA, K, CL, CO2, GLUCOSE, BUN, CREATININE, CALCIUM, MG, PHOS in the last 168 hours. Liver Function Tests: No results for input(s): AST, ALT, ALKPHOS, BILITOT, PROT, ALBUMIN in the last 168 hours. No results for input(s): LIPASE, AMYLASE in the last 168 hours. No results for input(s): AMMONIA in the last 168 hours. CBC: No results for input(s): WBC, NEUTROABS, HGB, HCT, MCV, PLT in the last 168 hours. Cardiac Enzymes: No results for input(s): CKTOTAL, CKMB, CKMBINDEX, TROPONINI in the last 168 hours. BNP: Invalid input(s): POCBNP CBG: Recent Labs  Lab 02/27/21 1558 02/27/21 2015 02/27/21 2347 02/28/21 0403 02/28/21 0830  GLUCAP 132* 92 115* 127* 70   D-Dimer No results for input(s): DDIMER in the last 72 hours. Hgb A1c No results for input(s): HGBA1C in the last  72 hours. Lipid Profile No results for input(s): CHOL, HDL, LDLCALC, TRIG, CHOLHDL, LDLDIRECT in the last 72 hours. Thyroid function studies No results for input(s): TSH, T4TOTAL, T3FREE, THYROIDAB in the last 72 hours.  Invalid input(s): FREET3 Anemia work up No results for input(s): VITAMINB12, FOLATE, FERRITIN, TIBC, IRON, RETICCTPCT in the last 72 hours. Urinalysis    Component Value Date/Time   COLORURINE YELLOW 02/11/2021 0725   APPEARANCEUR CLEAR 02/11/2021 0725   LABSPEC 1.024 02/11/2021 0725   PHURINE 5.0 02/11/2021 0725   GLUCOSEU NEGATIVE 02/11/2021 0725   GLUCOSEU 500 (A) 11/14/2020 1345   HGBUR NEGATIVE 02/11/2021 0725   BILIRUBINUR NEGATIVE 02/11/2021 0725   KETONESUR NEGATIVE 02/11/2021 0725   PROTEINUR NEGATIVE 02/11/2021 0725   UROBILINOGEN 0.2 11/14/2020 1345   NITRITE NEGATIVE 02/11/2021 0725   LEUKOCYTESUR NEGATIVE 02/11/2021 0725   Sepsis Labs Invalid input(s): PROCALCITONIN,  WBC,  LACTICIDVEN Microbiology Recent Results (from the past 240 hour(s))  Resp Panel by RT-PCR (Flu A&B, Covid) Nasopharyngeal Swab     Status: None   Collection Time: 02/27/21   2:00 PM   Specimen: Nasopharyngeal Swab; Nasopharyngeal(NP) swabs in vial transport medium  Result Value Ref Range Status   SARS Coronavirus 2 by RT PCR NEGATIVE NEGATIVE Final    Comment: (NOTE) SARS-CoV-2 target nucleic acids are NOT DETECTED.  The SARS-CoV-2 RNA is generally detectable in upper respiratory specimens during the acute phase of infection. The lowest concentration of SARS-CoV-2 viral copies this assay can detect is 138 copies/mL. A negative result does not preclude SARS-Cov-2 infection and should not be used as the sole basis for treatment or other patient management decisions. A negative result may occur with  improper specimen collection/handling, submission of specimen other than nasopharyngeal swab, presence of viral mutation(s) within the areas targeted by this assay, and inadequate number of viral copies(<138 copies/mL). A negative result must be combined with clinical observations, patient history, and epidemiological information. The expected result is Negative.  Fact Sheet for Patients:  EntrepreneurPulse.com.au  Fact Sheet for Healthcare Providers:  IncredibleEmployment.be  This test is no t yet approved or cleared by the [place] FDA and  has been authorized for detection and/or diagnosis of SARS-CoV-2 by FDA under an Emergency Use Authorization (EUA). This EUA will remain  in effect (meaning this test can be used) for the duration of the COVID-19 declaration under Section 564(b)(1) of the Act, 21 U.S.C.section 360bbb-3(b)(1), unless the authorization is terminated  or revoked sooner.       Influenza A by PCR NEGATIVE NEGATIVE Final   Influenza B by PCR NEGATIVE NEGATIVE Final    Comment: (NOTE) The Xpert Xpress SARS-CoV-2/FLU/RSV plus assay is intended as an aid in the diagnosis of influenza from Nasopharyngeal swab specimens and should not be used as a sole basis for treatment. Nasal washings and aspirates  are unacceptable for Xpert Xpress SARS-CoV-2/FLU/RSV testing.  Fact Sheet for Patients: EntrepreneurPulse.com.au  Fact Sheet for Healthcare Providers: IncredibleEmployment.be  This test is not yet approved or cleared by the [place] FDA and has been authorized for detection and/or diagnosis of SARS-CoV-2 by FDA under an Emergency Use Authorization (EUA). This EUA will remain in effect (meaning this test can be used) for the duration of the COVID-19 declaration under Section 564(b)(1) of the Act, 21 U.S.C. section 360bbb-3(b)(1), unless the authorization is terminated or revoked.  Performed at [facility], [place] [address] [place], [place] [address]      Time coordinating discharge: Over 30 minutes  SIGNED:   Charlynne Cousins, MD  [facility] 02/28/2021, 8:52 AM Pager   If 7PM-7AM, please contact night-coverage [email or web address] Password [email or web address]

## 2021-02-28 NOTE — Plan of Care (Signed)
  Problem: Clinical Measurements: Goal: Ability to maintain clinical measurements within normal limits will improve Outcome: Progressing Goal: Will remain free from infection Outcome: Progressing Goal: Diagnostic test results will improve Outcome: Progressing Goal: Respiratory complications will improve Outcome: Progressing Goal: Cardiovascular complication will be avoided Outcome: Progressing   Problem: Nutrition: Goal: Adequate nutrition will be maintained Outcome: Progressing   Problem: Intracerebral Hemorrhage Tissue Perfusion: Goal: Complications of Intracerebral Hemorrhage will be minimized Outcome: Progressing   Problem: Nutrition: Goal: Risk of aspiration will decrease Outcome: Progressing   Problem: Intracerebral Hemorrhage Tissue Perfusion: Goal: Complications of Intracerebral Hemorrhage will be minimized Outcome: Progressing

## 2021-02-28 NOTE — Care Management Important Message (Signed)
Important Message  Patient Details  Name: Brian [name] MRN: [id number] Date of Birth: 1935/12/15   Medicare Important Message Given:  Yes     [name] [address] 02/28/2021, 3:20 PM

## 2021-02-28 NOTE — Progress Notes (Signed)
Called [facility] to give report, no answer, will call back again later

## 2021-03-01 DIAGNOSIS — M24541 Contracture, right hand: Secondary | ICD-10-CM | POA: Diagnosis not present

## 2021-03-01 DIAGNOSIS — D649 Anemia, unspecified: Secondary | ICD-10-CM | POA: Diagnosis not present

## 2021-03-01 DIAGNOSIS — I4891 Unspecified atrial fibrillation: Secondary | ICD-10-CM | POA: Diagnosis not present

## 2021-03-01 DIAGNOSIS — I69351 Hemiplegia and hemiparesis following cerebral infarction affecting right dominant side: Secondary | ICD-10-CM | POA: Diagnosis not present

## 2021-03-01 DIAGNOSIS — M6281 Muscle weakness (generalized): Secondary | ICD-10-CM | POA: Diagnosis not present

## 2021-03-01 DIAGNOSIS — I69391 Dysphagia following cerebral infarction: Secondary | ICD-10-CM | POA: Diagnosis not present

## 2021-03-01 DIAGNOSIS — J690 Pneumonitis due to inhalation of food and vomit: Secondary | ICD-10-CM | POA: Diagnosis not present

## 2021-03-01 DIAGNOSIS — G8191 Hemiplegia, unspecified affecting right dominant side: Secondary | ICD-10-CM | POA: Diagnosis not present

## 2021-03-01 DIAGNOSIS — I10 Essential (primary) hypertension: Secondary | ICD-10-CM | POA: Diagnosis not present

## 2021-03-01 DIAGNOSIS — R293 Abnormal posture: Secondary | ICD-10-CM | POA: Diagnosis not present

## 2021-03-01 DIAGNOSIS — I619 Nontraumatic intracerebral hemorrhage, unspecified: Secondary | ICD-10-CM | POA: Diagnosis not present

## 2021-03-01 DIAGNOSIS — I639 Cerebral infarction, unspecified: Secondary | ICD-10-CM | POA: Diagnosis not present

## 2021-03-01 DIAGNOSIS — E1165 Type 2 diabetes mellitus with hyperglycemia: Secondary | ICD-10-CM | POA: Diagnosis not present

## 2021-03-01 DIAGNOSIS — I629 Nontraumatic intracranial hemorrhage, unspecified: Secondary | ICD-10-CM | POA: Diagnosis not present

## 2021-03-02 DIAGNOSIS — G8191 Hemiplegia, unspecified affecting right dominant side: Secondary | ICD-10-CM | POA: Diagnosis not present

## 2021-03-02 DIAGNOSIS — R293 Abnormal posture: Secondary | ICD-10-CM | POA: Diagnosis not present

## 2021-03-02 DIAGNOSIS — M6281 Muscle weakness (generalized): Secondary | ICD-10-CM | POA: Diagnosis not present

## 2021-03-02 DIAGNOSIS — I69391 Dysphagia following cerebral infarction: Secondary | ICD-10-CM | POA: Diagnosis not present

## 2021-03-02 DIAGNOSIS — I69351 Hemiplegia and hemiparesis following cerebral infarction affecting right dominant side: Secondary | ICD-10-CM | POA: Diagnosis not present

## 2021-03-02 DIAGNOSIS — M24541 Contracture, right hand: Secondary | ICD-10-CM | POA: Diagnosis not present

## 2021-03-03 DIAGNOSIS — M24541 Contracture, right hand: Secondary | ICD-10-CM | POA: Diagnosis not present

## 2021-03-03 DIAGNOSIS — G8191 Hemiplegia, unspecified affecting right dominant side: Secondary | ICD-10-CM | POA: Diagnosis not present

## 2021-03-03 DIAGNOSIS — I69351 Hemiplegia and hemiparesis following cerebral infarction affecting right dominant side: Secondary | ICD-10-CM | POA: Diagnosis not present

## 2021-03-03 DIAGNOSIS — M6281 Muscle weakness (generalized): Secondary | ICD-10-CM | POA: Diagnosis not present

## 2021-03-03 DIAGNOSIS — R293 Abnormal posture: Secondary | ICD-10-CM | POA: Diagnosis not present

## 2021-03-03 DIAGNOSIS — I69391 Dysphagia following cerebral infarction: Secondary | ICD-10-CM | POA: Diagnosis not present

## 2021-03-05 DIAGNOSIS — I69351 Hemiplegia and hemiparesis following cerebral infarction affecting right dominant side: Secondary | ICD-10-CM | POA: Diagnosis not present

## 2021-03-05 DIAGNOSIS — M24541 Contracture, right hand: Secondary | ICD-10-CM | POA: Diagnosis not present

## 2021-03-05 DIAGNOSIS — G8191 Hemiplegia, unspecified affecting right dominant side: Secondary | ICD-10-CM | POA: Diagnosis not present

## 2021-03-05 DIAGNOSIS — R293 Abnormal posture: Secondary | ICD-10-CM | POA: Diagnosis not present

## 2021-03-05 DIAGNOSIS — I69391 Dysphagia following cerebral infarction: Secondary | ICD-10-CM | POA: Diagnosis not present

## 2021-03-05 DIAGNOSIS — M6281 Muscle weakness (generalized): Secondary | ICD-10-CM | POA: Diagnosis not present

## 2021-03-06 DIAGNOSIS — M24541 Contracture, right hand: Secondary | ICD-10-CM | POA: Diagnosis not present

## 2021-03-06 DIAGNOSIS — R293 Abnormal posture: Secondary | ICD-10-CM | POA: Diagnosis not present

## 2021-03-06 DIAGNOSIS — M6281 Muscle weakness (generalized): Secondary | ICD-10-CM | POA: Diagnosis not present

## 2021-03-06 DIAGNOSIS — G8191 Hemiplegia, unspecified affecting right dominant side: Secondary | ICD-10-CM | POA: Diagnosis not present

## 2021-03-06 DIAGNOSIS — I69351 Hemiplegia and hemiparesis following cerebral infarction affecting right dominant side: Secondary | ICD-10-CM | POA: Diagnosis not present

## 2021-03-06 DIAGNOSIS — I69391 Dysphagia following cerebral infarction: Secondary | ICD-10-CM | POA: Diagnosis not present

## 2021-03-07 DIAGNOSIS — M24541 Contracture, right hand: Secondary | ICD-10-CM | POA: Diagnosis not present

## 2021-03-07 DIAGNOSIS — I69391 Dysphagia following cerebral infarction: Secondary | ICD-10-CM | POA: Diagnosis not present

## 2021-03-07 DIAGNOSIS — I69351 Hemiplegia and hemiparesis following cerebral infarction affecting right dominant side: Secondary | ICD-10-CM | POA: Diagnosis not present

## 2021-03-07 DIAGNOSIS — G8191 Hemiplegia, unspecified affecting right dominant side: Secondary | ICD-10-CM | POA: Diagnosis not present

## 2021-03-07 DIAGNOSIS — R293 Abnormal posture: Secondary | ICD-10-CM | POA: Diagnosis not present

## 2021-03-07 DIAGNOSIS — M6281 Muscle weakness (generalized): Secondary | ICD-10-CM | POA: Diagnosis not present

## 2021-03-08 ENCOUNTER — Other Ambulatory Visit: Payer: Self-pay

## 2021-03-08 ENCOUNTER — Inpatient Hospital Stay (HOSPITAL_COMMUNITY)
Admission: EM | Admit: 2021-03-08 | Discharge: 2021-03-15 | DRG: 189 | Disposition: A | Discharge status: Skilled Nursing Facility | Payer: Medicare HMO | Source: Skilled Nursing Facility | Attending: Internal Medicine | Admitting: Internal Medicine

## 2021-03-08 ENCOUNTER — Emergency Department (HOSPITAL_COMMUNITY): Payer: Medicare HMO

## 2021-03-08 ENCOUNTER — Encounter (HOSPITAL_COMMUNITY): Payer: Self-pay

## 2021-03-08 DIAGNOSIS — Z823 Family history of stroke: Secondary | ICD-10-CM | POA: Diagnosis not present

## 2021-03-08 DIAGNOSIS — Z7189 Other specified counseling: Secondary | ICD-10-CM

## 2021-03-08 DIAGNOSIS — E669 Obesity, unspecified: Secondary | ICD-10-CM | POA: Diagnosis present

## 2021-03-08 DIAGNOSIS — J189 Pneumonia, unspecified organism: Secondary | ICD-10-CM | POA: Diagnosis not present

## 2021-03-08 DIAGNOSIS — I482 Chronic atrial fibrillation, unspecified: Secondary | ICD-10-CM | POA: Diagnosis not present

## 2021-03-08 DIAGNOSIS — E860 Dehydration: Secondary | ICD-10-CM | POA: Diagnosis not present

## 2021-03-08 DIAGNOSIS — Z79899 Other long term (current) drug therapy: Secondary | ICD-10-CM

## 2021-03-08 DIAGNOSIS — R0600 Dyspnea, unspecified: Secondary | ICD-10-CM

## 2021-03-08 DIAGNOSIS — Z794 Long term (current) use of insulin: Secondary | ICD-10-CM | POA: Diagnosis not present

## 2021-03-08 DIAGNOSIS — Z20822 Contact with and (suspected) exposure to covid-19: Secondary | ICD-10-CM | POA: Diagnosis present

## 2021-03-08 DIAGNOSIS — Z7401 Bed confinement status: Secondary | ICD-10-CM | POA: Diagnosis not present

## 2021-03-08 DIAGNOSIS — E875 Hyperkalemia: Secondary | ICD-10-CM | POA: Diagnosis present

## 2021-03-08 DIAGNOSIS — I69391 Dysphagia following cerebral infarction: Secondary | ICD-10-CM

## 2021-03-08 DIAGNOSIS — N179 Acute kidney failure, unspecified: Secondary | ICD-10-CM | POA: Diagnosis not present

## 2021-03-08 DIAGNOSIS — J309 Allergic rhinitis, unspecified: Secondary | ICD-10-CM | POA: Diagnosis present

## 2021-03-08 DIAGNOSIS — E785 Hyperlipidemia, unspecified: Secondary | ICD-10-CM | POA: Diagnosis present

## 2021-03-08 DIAGNOSIS — R4182 Altered mental status, unspecified: Secondary | ICD-10-CM | POA: Diagnosis not present

## 2021-03-08 DIAGNOSIS — J690 Pneumonitis due to inhalation of food and vomit: Secondary | ICD-10-CM | POA: Diagnosis not present

## 2021-03-08 DIAGNOSIS — Z7984 Long term (current) use of oral hypoglycemic drugs: Secondary | ICD-10-CM | POA: Diagnosis not present

## 2021-03-08 DIAGNOSIS — E1159 Type 2 diabetes mellitus with other circulatory complications: Secondary | ICD-10-CM

## 2021-03-08 DIAGNOSIS — IMO0002 Reserved for concepts with insufficient information to code with codable children: Secondary | ICD-10-CM

## 2021-03-08 DIAGNOSIS — R404 Transient alteration of awareness: Secondary | ICD-10-CM | POA: Diagnosis not present

## 2021-03-08 DIAGNOSIS — J80 Acute respiratory distress syndrome: Secondary | ICD-10-CM | POA: Diagnosis not present

## 2021-03-08 DIAGNOSIS — I517 Cardiomegaly: Secondary | ICD-10-CM | POA: Diagnosis not present

## 2021-03-08 DIAGNOSIS — I10 Essential (primary) hypertension: Secondary | ICD-10-CM | POA: Diagnosis present

## 2021-03-08 DIAGNOSIS — R0689 Other abnormalities of breathing: Secondary | ICD-10-CM | POA: Diagnosis not present

## 2021-03-08 DIAGNOSIS — J9601 Acute respiratory failure with hypoxia: Principal | ICD-10-CM | POA: Diagnosis present

## 2021-03-08 DIAGNOSIS — E119 Type 2 diabetes mellitus without complications: Secondary | ICD-10-CM | POA: Diagnosis not present

## 2021-03-08 DIAGNOSIS — Z87891 Personal history of nicotine dependence: Secondary | ICD-10-CM | POA: Diagnosis not present

## 2021-03-08 DIAGNOSIS — Z6830 Body mass index (BMI) 30.0-30.9, adult: Secondary | ICD-10-CM | POA: Diagnosis not present

## 2021-03-08 DIAGNOSIS — D649 Anemia, unspecified: Secondary | ICD-10-CM | POA: Diagnosis not present

## 2021-03-08 DIAGNOSIS — I469 Cardiac arrest, cause unspecified: Secondary | ICD-10-CM | POA: Diagnosis not present

## 2021-03-08 DIAGNOSIS — Z931 Gastrostomy status: Secondary | ICD-10-CM

## 2021-03-08 DIAGNOSIS — R0602 Shortness of breath: Secondary | ICD-10-CM | POA: Diagnosis not present

## 2021-03-08 DIAGNOSIS — R0902 Hypoxemia: Secondary | ICD-10-CM | POA: Diagnosis not present

## 2021-03-08 DIAGNOSIS — Z743 Need for continuous supervision: Secondary | ICD-10-CM | POA: Diagnosis not present

## 2021-03-08 LAB — CBC WITH DIFFERENTIAL/PLATELET
Abs Immature Granulocytes: 0.02 10*3/uL (ref 0.00–0.07)
Basophils Absolute: 0 10*3/uL (ref 0.0–0.1)
Basophils Relative: 0 %
Eosinophils Absolute: 0 10*3/uL (ref 0.0–0.5)
Eosinophils Relative: 0 %
HCT: 38.2 % — ABNORMAL LOW (ref 39.0–52.0)
Hemoglobin: 11 g/dL — ABNORMAL LOW (ref 13.0–17.0)
Immature Granulocytes: 0 %
Lymphocytes Relative: 10 %
Lymphs Abs: 0.7 10*3/uL (ref 0.7–4.0)
MCH: 25.3 pg — ABNORMAL LOW (ref 26.0–34.0)
MCHC: 28.8 g/dL — ABNORMAL LOW (ref 30.0–36.0)
MCV: 87.8 fL (ref 80.0–100.0)
Monocytes Absolute: 0.5 10*3/uL (ref 0.1–1.0)
Monocytes Relative: 7 %
Neutro Abs: 5.6 10*3/uL (ref 1.7–7.7)
Neutrophils Relative %: 83 %
Platelets: 171 10*3/uL (ref 150–400)
RBC: 4.35 MIL/uL (ref 4.22–5.81)
RDW: 16.7 % — ABNORMAL HIGH (ref 11.5–15.5)
WBC: 6.8 10*3/uL (ref 4.0–10.5)
nRBC: 0 % (ref 0.0–0.2)

## 2021-03-08 LAB — URINALYSIS, ROUTINE W REFLEX MICROSCOPIC
Bilirubin Urine: NEGATIVE
Glucose, UA: NEGATIVE mg/dL
Hgb urine dipstick: NEGATIVE
Ketones, ur: NEGATIVE mg/dL
Leukocytes,Ua: NEGATIVE
Nitrite: NEGATIVE
Protein, ur: NEGATIVE mg/dL
Specific Gravity, Urine: 1.025 (ref 1.005–1.030)
pH: 5 (ref 5.0–8.0)

## 2021-03-08 LAB — RESP PANEL BY RT-PCR (FLU A&B, COVID) ARPGX2
Influenza A by PCR: NEGATIVE
Influenza B by PCR: NEGATIVE
SARS Coronavirus 2 by RT PCR: NEGATIVE

## 2021-03-08 LAB — COMPREHENSIVE METABOLIC PANEL
ALT: 25 U/L (ref 0–44)
AST: 44 U/L — ABNORMAL HIGH (ref 15–41)
Albumin: 3 g/dL — ABNORMAL LOW (ref 3.5–5.0)
Alkaline Phosphatase: 61 U/L (ref 38–126)
Anion gap: 10 (ref 5–15)
BUN: 40 mg/dL — ABNORMAL HIGH (ref 8–23)
CO2: 23 mmol/L (ref 22–32)
Calcium: 8.9 mg/dL (ref 8.9–10.3)
Chloride: 106 mmol/L (ref 98–111)
Creatinine, Ser: 1.37 mg/dL — ABNORMAL HIGH (ref 0.61–1.24)
GFR, Estimated: 51 mL/min — ABNORMAL LOW (ref >60)
Glucose, Bld: 190 mg/dL — ABNORMAL HIGH (ref 70–99)
Potassium: 5.3 mmol/L — ABNORMAL HIGH (ref 3.5–5.1)
Sodium: 139 mmol/L (ref 135–145)
Total Bilirubin: 1.6 mg/dL — ABNORMAL HIGH (ref 0.3–1.2)
Total Protein: 6.7 g/dL (ref 6.5–8.1)

## 2021-03-08 LAB — GLUCOSE, CAPILLARY
Glucose-Capillary: 164 mg/dL — ABNORMAL HIGH (ref 70–99)
Glucose-Capillary: 130 mg/dL — ABNORMAL HIGH (ref 70–99)
Glucose-Capillary: 137 mg/dL — ABNORMAL HIGH (ref 70–99)

## 2021-03-08 LAB — APTT: aPTT: 30 seconds (ref 24–36)

## 2021-03-08 LAB — LACTIC ACID, PLASMA
Lactic Acid, Venous: 1.9 mmol/L (ref 0.5–1.9)
Lactic Acid, Venous: 2 mmol/L (ref 0.5–1.9)

## 2021-03-08 LAB — CBG MONITORING, ED: Glucose-Capillary: 182 mg/dL — ABNORMAL HIGH (ref 70–99)

## 2021-03-08 LAB — PROTIME-INR
INR: 1.3 — ABNORMAL HIGH (ref 0.8–1.2)
Prothrombin Time: 16.6 seconds — ABNORMAL HIGH (ref 11.4–15.2)

## 2021-03-08 MED ORDER — SODIUM CHLORIDE 0.9 % IV SOLN
2.0000 g | INTRAVENOUS | Status: DC
  Administered 2021-03-08 – 2021-03-09 (×2): 2 g via INTRAVENOUS
  Filled 2021-03-08 (×2): qty 20

## 2021-03-08 MED ORDER — AMOXICILLIN 500 MG PO CAPS: 1000.0000 mg | ORAL_CAPSULE | Freq: Two times a day (BID) | ORAL | 0 refills | Status: DC

## 2021-03-08 MED ORDER — INSULIN DETEMIR 100 UNIT/ML ~~LOC~~ SOLN
10.0000 [IU] | Freq: Every day | SUBCUTANEOUS | Status: DC
  Administered 2021-03-08 – 2021-03-11 (×4): 10 [IU] via SUBCUTANEOUS
  Filled 2021-03-08 (×4): qty 0.1

## 2021-03-08 MED ORDER — ONDANSETRON HCL 4 MG/2ML IJ SOLN
4.0000 mg | Freq: Four times a day (QID) | INTRAMUSCULAR | Status: DC | PRN
  Administered 2021-03-08: 4 mg via INTRAVENOUS
  Filled 2021-03-08: qty 2

## 2021-03-08 MED ORDER — GLUCERNA 1.5 CAL PO LIQD
1000.0000 mL | ORAL | Status: DC
  Administered 2021-03-08 – 2021-03-15 (×8): 1000 mL
  Filled 2021-03-08 (×11): qty 1000

## 2021-03-08 MED ORDER — ONDANSETRON HCL 4 MG PO TABS: 4.0000 mg | ORAL_TABLET | Freq: Four times a day (QID) | ORAL | Status: DC | PRN

## 2021-03-08 MED ORDER — ACETAMINOPHEN 650 MG RE SUPP
650.0000 mg | Freq: Four times a day (QID) | RECTAL | Status: DC | PRN
  Administered 2021-03-09: 650 mg via RECTAL
  Filled 2021-03-08: qty 1

## 2021-03-08 MED ORDER — LACTATED RINGERS IV BOLUS (SEPSIS)
1000.0000 mL | Freq: Once | INTRAVENOUS | Status: AC
  Administered 2021-03-08: 1000 mL via INTRAVENOUS

## 2021-03-08 MED ORDER — FREE WATER
100.0000 mL | Status: DC
  Administered 2021-03-08 – 2021-03-15 (×42): 100 mL

## 2021-03-08 MED ORDER — SODIUM CHLORIDE 0.9 % IV SOLN: INTRAVENOUS | Status: DC

## 2021-03-08 MED ORDER — GUAIFENESIN-DM 100-10 MG/5ML PO SYRP
5.0000 mL | ORAL_SOLUTION | ORAL | Status: DC | PRN
  Filled 2021-03-08 (×2): qty 5

## 2021-03-08 MED ORDER — ALBUTEROL SULFATE (2.5 MG/3ML) 0.083% IN NEBU: 2.5000 mg | INHALATION_SOLUTION | Freq: Four times a day (QID) | RESPIRATORY_TRACT | Status: DC | PRN

## 2021-03-08 MED ORDER — OSMOLITE 1.2 CAL PO LIQD: 1000.0000 mL | ORAL | Status: DC

## 2021-03-08 MED ORDER — AMOXICILLIN 500 MG PO CAPS: 1000.0000 mg | ORAL_CAPSULE | Freq: Once | ORAL | Status: DC

## 2021-03-08 MED ORDER — LEVETIRACETAM 500 MG PO TABS: 500.0000 mg | ORAL_TABLET | Freq: Two times a day (BID) | ORAL | 0 refills | Status: DC

## 2021-03-08 MED ORDER — ACETAMINOPHEN 325 MG PO TABS
650.0000 mg | ORAL_TABLET | Freq: Four times a day (QID) | ORAL | Status: DC | PRN
  Administered 2021-03-08 – 2021-03-09 (×2): 650 mg
  Filled 2021-03-08 (×2): qty 2

## 2021-03-08 MED ORDER — GUAIFENESIN-DM 100-10 MG/5ML PO SYRP
5.0000 mL | ORAL_SOLUTION | ORAL | Status: DC | PRN
  Administered 2021-03-08 – 2021-03-14 (×4): 5 mL
  Filled 2021-03-08 (×6): qty 5

## 2021-03-08 MED ORDER — LACTATED RINGERS IV SOLN: INTRAVENOUS | Status: DC

## 2021-03-08 MED ORDER — SODIUM CHLORIDE 0.9 % IV SOLN
500.0000 mg | INTRAVENOUS | Status: DC
  Administered 2021-03-08 – 2021-03-13 (×6): 500 mg via INTRAVENOUS
  Filled 2021-03-08 (×6): qty 500

## 2021-03-08 MED ORDER — INSULIN ASPART 100 UNIT/ML IJ SOLN
0.0000 [IU] | Freq: Three times a day (TID) | INTRAMUSCULAR | Status: DC
  Administered 2021-03-08 – 2021-03-09 (×4): 2 [IU] via SUBCUTANEOUS
  Administered 2021-03-10: 3 [IU] via SUBCUTANEOUS
  Administered 2021-03-10 (×2): 2 [IU] via SUBCUTANEOUS
  Administered 2021-03-11: 3 [IU] via SUBCUTANEOUS
  Administered 2021-03-11: 2 [IU] via SUBCUTANEOUS
  Administered 2021-03-11 – 2021-03-12 (×3): 3 [IU] via SUBCUTANEOUS
  Administered 2021-03-12: 5 [IU] via SUBCUTANEOUS
  Administered 2021-03-13 (×2): 3 [IU] via SUBCUTANEOUS
  Administered 2021-03-13: 2 [IU] via SUBCUTANEOUS
  Administered 2021-03-14 (×2): 3 [IU] via SUBCUTANEOUS
  Administered 2021-03-14: 5 [IU] via SUBCUTANEOUS
  Administered 2021-03-15: 3 [IU] via SUBCUTANEOUS

## 2021-03-08 MED ORDER — INSULIN ASPART 100 UNIT/ML IJ SOLN: 0.0000 [IU] | Freq: Every day | INTRAMUSCULAR | Status: DC

## 2021-03-08 NOTE — ED Provider Notes (Signed)
Patient seen after prior ED provider.  Patient is comfortably resting on 2.5 L via nasal cannula.  Sats are in the 92% range.  Patient's case is discussed with hospitalist service.  Patient will be admitted for further work-up and treatment.   Valarie Merino, MD 03/08/21 [phone]

## 2021-03-08 NOTE — ED Notes (Signed)
Patient had bowel movement. New linens and brief and primofit applied.

## 2021-03-08 NOTE — ED Notes (Signed)
Called phlebotomy to get blood work, no answer.

## 2021-03-08 NOTE — Progress Notes (Signed)
Sepsis surveillance as per protocol

## 2021-03-08 NOTE — ED Notes (Signed)
Called IV team, no answer 

## 2021-03-08 NOTE — ED Notes (Signed)
Starting IV antibiotics before 2nd set of cultures obtained.

## 2021-03-08 NOTE — ED Triage Notes (Signed)
Patient BIB [facility] from [facility]. EMS arrived, patient 84% room air, facility put him on 4L [place] and he was 88%. Rales bilaterally upon arrival. Patient nonverbal and has Gtube. Patient has deficits from a stroke and couldn't tolerate CPAP. 15L nonrebreather, patient vomited everywhere but the rales cleared up and patients saturation at 94%. Patient was on a continuous Gtube feeling then facility stopped it when he became short of breath.   EMS 22G Left Hand

## 2021-03-08 NOTE — ED Notes (Signed)
Just spoke with AM IV Team regarding additional IV placement and additional blood work needed.

## 2021-03-08 NOTE — ED Notes (Addendum)
Patients IV does not pull back blood. 4 straight stick attempts unsuccessful by Meredith Staggers, RN and this RN. IV consult in. Doctor Roxanne Mins aware.

## 2021-03-08 NOTE — H&P (Signed)
History and Physical    Brian [name] ZCH:[id number] DOB: 1936-05-19 DOA: 03/08/2021  PCP: [name], MD  Patient coming from: [facility]  Chief Complaint: respiratory distress  HPI: Brian [name] is a 85 y.o. male with medical history significant of a fib, ICH/stroke, DM2, dysphagia. Presenting with acute respiratory failure w/ hypoxia. History is per chart review and brother as patient is altered. Per his brother's account, the patient was doing ok in rehab. He had not had any significant respiratory issues during his stay except for intermittent congestion. Apparently, the patient became nauseous yesterday and started vomiting. Apparently later, he was noted to be hypoxic w/ RA sats of 84%. He was started on 4L [place] and EMS was called.    ED Course: CXR was positive for PNA. He was started on rocephin. TRH was called for admission.   Review of Systems: Unable to obtain d/t mentation.    PMHx Past Medical History:  Diagnosis Date   ALLERGIC RHINITIS 10/13/2007   ANEMIA, CHRONIC DISEASE NEC 03/31/2007   BENIGN PROSTATIC HYPERTROPHY 03/31/2007   Chest pain    Stress echo, normal, December, 2012   Chronic anemia    DIABETES MELLITUS, TYPE II 03/31/2007   [place] DISEASE, LUMBAR 03/31/2007   ED (erectile dysfunction)    Ejection fraction    EF  normal, stress echo, December,  2012   History of prostatitis    HYPERLIPIDEMIA 03/31/2007   HYPERTENSION 03/31/2007   Nephrolithiasis 12/07/2010   Obesity    POLYP, ANAL AND RECTAL 03/31/2007   Right eye trauma    hx as child   Stroke [facility])     PSHx Past Surgical History:  Procedure Laterality Date   IR GASTROSTOMY TUBE MOD SED  02/08/2021   LUMBAR DISC SURGERY     1999   PROSTATE BIOPSY     s/p   RECTAL POLYPECTOMY     Transanal excision 10/2005   SKIN BIOPSY     s/p right upper back 2009- benign Dr. Tonia Brooms    SocHx  reports that he quit smoking about 27 years ago. He has never used smokeless tobacco. He reports current alcohol  use of about 1.0 standard drink of alcohol per week. He reports that he does not use drugs.  No Known Allergies  FamHx Family History  Problem Relation Age of Onset   Colon polyps Mother    Stroke Mother        due to ICA stenosis   CVA Neg Hx    Seizures Neg Hx     Prior to Admission medications   Medication Sig Start Date End Date Taking? Authorizing Provider  acetaminophen (TYLENOL) 325 MG tablet Take 2 tablets (650 mg total) by mouth every 4 (four) hours as needed for mild pain or fever (or temp > 37.5 C (99.5 F)). 06/27/18   Roxan Hockey, MD  amiodarone (PACERONE) 200 MG tablet Place 1 tablet (200 mg total) into feeding tube daily. 02/28/21   Charlynne Cousins, MD  amLODipine (NORVASC) 10 MG tablet TAKE 1 TABLET BY MOUTH EVERY DAY 01/09/21   Fay Records, MD  atorvastatin (LIPITOR) 80 MG tablet TAKE 1 TABLET BY MOUTH EVERY DAY IN THE EVENING AT 6PM 10/31/20   [name], MD  Blood Glucose Monitoring Suppl [facility] VERIO) w/Device KIT Use as directed twice daily E11.9 08/11/19   [name], MD  carvedilol (COREG) 12.5 MG tablet Take 1 tablet (12.5 mg total) by mouth 2 (two) times  daily with a meal. 11/14/20   [name], MD  gabapentin (NEURONTIN) 100 MG capsule TAKE 1 CAPSULE BY MOUTH THREE TIMES A DAY 11/10/20   [name], MD  insulin aspart (NOVOLOG) 100 UNIT/ML injection Inject 3 Units into the skin 3 (three) times daily. Hold for blood glucose less than 160. 02/28/21   Charlynne Cousins, MD  insulin detemir (LEVEMIR) 100 UNIT/ML injection Inject 0.15 mLs (15 Units total) into the skin 2 (two) times daily. 02/28/21   Charlynne Cousins, MD  Insulin Pen Needle 32G X 4 MM MISC Use 1x a day 01/03/21   Philemon Kingdom, MD  KLOR-CON M20 20 MEQ tablet TAKE 1 TABLET (20 MEQ TOTAL) BY MOUTH 2 (TWO) TIMES A WEEK. 02/06/21   Fay Records, MD  Lancets MISC Use as directed twice daily E11.9 08/11/19   [name], MD  losartan (COZAAR) 100 MG tablet TAKE 1 TABLET BY  MOUTH EVERY DAY 09/27/20   [name], MD  metFORMIN (GLUCOPHAGE) 1000 MG tablet Take 1 tablet (1,000 mg total) by mouth 2 (two) times daily with a meal. 01/03/21   Philemon Kingdom, MD  [facility] VERIO test strip USE AS DIRECTED TWICE DAILY E11.9 08/19/20   [name], MD  OVER THE COUNTER MEDICATION Place 1 drop into both eyes daily as needed (dry eyes). Over the counter lubricating eye drop    [provider]  sitaGLIPtin (JANUVIA) 100 MG tablet Place 1 tablet (100 mg total) into feeding tube daily. 02/28/21   Charlynne Cousins, MD  Water For Irrigation, Sterile (FREE WATER) SOLN Place 100 mLs into feeding tube every 4 (four) hours. 02/28/21   Charlynne Cousins, MD    Physical Exam: Vitals:   03/08/21 0700 03/08/21 0730 03/08/21 0817 03/08/21 0904  BP: 135/71 (!) 150/65 (!) 142/63 (!) 142/63  Pulse: 95 95 96 94  Resp: (!) 21 18 (!) 21 (!) 22  Temp:      TempSrc:      SpO2: 95% 93% (!) 85% 93%  Weight:      Height:        General: 85 y.o. ill appearing male resting in bed  Eyes: PERRL, normal sclera ENMT: Nares patent w/o discharge, orophaynx clear, ears w/o discharge/lesions/ulcers Neck: Supple, trachea midline Cardiovascular: tachy, +S1, S2, no m/g/r, equal pulses throughout Respiratory: course, b/l rhonchi and upper airway transmission, comfortable appearing on 3L [place] GI: BS+, NDNT, no masses noted, no organomegaly noted; PEG noted MSK: No c/c; trace edema in hands Neuro: responsive to noxious stimulus, no following commands, somnolent  Labs on Admission: I have personally reviewed following labs and imaging studies  CBC: Recent Labs  Lab 03/08/21 0716  WBC 6.8  NEUTROABS 5.6  HGB 11.0*  HCT 38.2*  MCV 87.8  PLT 546   Basic Metabolic Panel: Recent Labs  Lab 03/08/21 0800  NA 139  K 5.3*  CL 106  CO2 23  GLUCOSE 190*  BUN 40*  CREATININE 1.37*  CALCIUM 8.9   GFR: Estimated Creatinine Clearance: 50.4 mL/min (A) (by C-G formula based on SCr  of 1.37 mg/dL (H)). Liver Function Tests: Recent Labs  Lab 03/08/21 0800  AST 44*  ALT 25  ALKPHOS 61  BILITOT 1.6*  PROT 6.7  ALBUMIN 3.0*   No results for input(s): LIPASE, AMYLASE in the last 168 hours. No results for input(s): AMMONIA in the last 168 hours. Coagulation Profile: Recent Labs  Lab 03/08/21 0716  INR 1.3*  Cardiac Enzymes: No results for input(s): CKTOTAL, CKMB, CKMBINDEX, TROPONINI in the last 168 hours. BNP (last 3 results) No results for input(s): PROBNP in the last 8760 hours. HbA1C: No results for input(s): HGBA1C in the last 72 hours. CBG: Recent Labs  Lab 03/08/21 0459  GLUCAP 182*   Lipid Profile: No results for input(s): CHOL, HDL, LDLCALC, TRIG, CHOLHDL, LDLDIRECT in the last 72 hours. Thyroid Function Tests: No results for input(s): TSH, T4TOTAL, FREET4, T3FREE, THYROIDAB in the last 72 hours. Anemia Panel: No results for input(s): VITAMINB12, FOLATE, FERRITIN, TIBC, IRON, RETICCTPCT in the last 72 hours. Urine analysis:    Component Value Date/Time   COLORURINE YELLOW 02/11/2021 0725   APPEARANCEUR CLEAR 02/11/2021 0725   LABSPEC 1.024 02/11/2021 0725   PHURINE 5.0 02/11/2021 0725   GLUCOSEU NEGATIVE 02/11/2021 0725   GLUCOSEU 500 (A) 11/14/2020 1345   HGBUR NEGATIVE 02/11/2021 0725   BILIRUBINUR NEGATIVE 02/11/2021 0725   KETONESUR NEGATIVE 02/11/2021 0725   PROTEINUR NEGATIVE 02/11/2021 0725   UROBILINOGEN 0.2 11/14/2020 1345   NITRITE NEGATIVE 02/11/2021 0725   LEUKOCYTESUR NEGATIVE 02/11/2021 0725    Radiological Exams on Admission: DG Chest Port 1 View  Result Date: 03/08/2021 CLINICAL DATA:  Questionable sepsis. Shortness of breath. Low oxygen saturations. EXAM: PORTABLE CHEST 1 VIEW COMPARISON:  Chest x-Baudelio 02/11/2021. FINDINGS: Borderline cardiomegaly. Bilateral pulmonary infiltrates/edema. No pleural effusion or pneumothorax. Degenerative change thoracic spine. IMPRESSION: Borderline cardiomegaly.  Bilateral pulmonary  infiltrates/edema. Electronically Signed   By: Marcello Moores  Register   On: 03/08/2021 06:00    EKG: Independently reviewed. Sinus tach, no st elevations  Assessment/Plan Aspiration PNA Acute hypoxic respiratory failure secondary to above SIRS     - admit to inpt, tele     - continue rocephin/zithro for now     - CXR w/ bll pulm infiltrates     - wean O2 as able; not on O2 at baseline     - nebs as needed  Recent hemorrhagic stroke     - continue home statin when confirmed  Hx of afib; no longer on anticoagulation d/t recent hemorrhagic stroke     - continue amiodarone, coreg  HTN     - continue home coreg, norvasc when confirmed  DMt2 Dysphagia, s/p PEG placement     - SSI, DM2 diet per PEG, glucose checks     - consult dietician for diet recs  AKI     - fluids; baseline SCr is ~1.0; follow  Hyperkalemia     - mild; sample hemolyzed; fluids for now, follow AM labs  Elevated LFTs     - mild, likely d/t dehydration     - follow for now  Normocytic anemia     - no evidence of bleed, follow  DVT prophylaxis: SCDs  Code Status: FULL  Family Communication: Attempted calls to son and dtr in chart. Received VM only. Was able to connect brother listed in chart. Updated with plan. Consults called: None   Status is: Inpatient  Remains inpatient appropriate because:Inpatient level of care appropriate due to severity of illness  Dispo: The patient is from: SNF              Anticipated d/c is to: SNF              Patient currently is not medically stable to d/c.   Difficult to place patient No  Time spent coordinating admission: 45 minutes  [facility]  If 7PM-7AM, please contact  night-coverage [email or web address]  03/08/2021, 9:40 AM

## 2021-03-08 NOTE — Progress Notes (Signed)
Initial Nutrition Assessment  DOCUMENTATION CODES:   Not applicable  INTERVENTION:  - will order TF via PEG: Glucerna 1.5 @ 20 ml/hr advance by 10 ml every 12 hours to reach goal rate of 60 ml/hr with 100 ml free water every 4 hours. - at goal rate, this regimen will provide 2160 kcal, 119 grams protein, and 1693 ml free water.    NUTRITION DIAGNOSIS:   Inadequate oral intake related to inability to eat as evidenced by NPO status.  GOAL:   Patient will meet greater than or equal to 90% of their needs  MONITOR:   TF tolerance, Labs, Weight trends, Skin  REASON FOR ASSESSMENT:   Consult Enteral/tube feeding initiation and management  ASSESSMENT:   85 y.o. male with medical history of a fib, ICH/stroke, type 2 DM, dysphagia s/p PEG. He presented to the ED due to acute respiratory failure with hypoxia. Patient experienced N/V at rehab the day prior to presentation.  He has been NPO since admission. Patient sleeping at the time of visit with brother at bedside. Brother is unsure of TF formula or regimen while patient was at rehab. Patient non-verbal during RD visit.  Weight today is 224 lb and weight haas been fairly stable for the past 4 months.   Per notes: - SIRS - recent hemorrhagic stroke - aspiration PNA - AKI    Labs reviewed; CBG: 182 mg/dl, K: 5.3 mmol/l, BUN: 40 mg/dl, creatinine: 1.37 mg/dl, GFR: 51 ml/min.  Medications reviewed; sliding scale novolog, 10 units levemir/day. IVF; NS @ 100 ml/hr.     NUTRITION - FOCUSED PHYSICAL EXAM:  Completed; no muscle or fat depletions  Diet Order:   Diet Order             Diet NPO time specified  Diet effective now                   EDUCATION NEEDS:   No education needs have been identified at this time  Skin:  Skin Assessment: Skin Integrity Issues: Skin Integrity Issues:: Stage II Stage II: L buttocks  Last BM:  PTA/unknown  Height:   Ht Readings from Last 1 Encounters:  03/08/21 6' (1.829 m)     Weight:   Wt Readings from Last 1 Encounters:  03/08/21 101.8 kg      Estimated Nutritional Needs:  Kcal:  2100-2300 kcal Protein:  105-120 grams Fluid:  >/= 2.1 L/day      Jarome Matin, MS, RD, LDN, CNSC Inpatient Clinical Dietitian RD pager # available in [place]  After hours/weekend pager # available in [facility]

## 2021-03-08 NOTE — Progress Notes (Signed)
Critical lab value called to floor. Lactic Acid 2.0  Informed MD Marylyn Ishihara via telephone call at 1431.  Intervention of  IVFs and ABXs already in place.

## 2021-03-08 NOTE — Progress Notes (Signed)
Pt arrived to floor at 1119 accompanied by ED staff and pt's brother.  Transferred from  stretcher to bed >1 person assist.   Primary nurse LPN Melissa made aware.

## 2021-03-08 NOTE — ED Provider Notes (Signed)
[facility] DEPT Provider Note   CSN: [id number] Arrival date & time: 03/08/21  0445     History Chief Complaint  Patient presents with   Shortness of Breath    Brian [name] is a 85 y.o. male.  The history is provided by the EMS personnel and the nursing home. The history is limited by the condition of the patient (Aphasia).  Shortness of Breath He has history of hypertension, diabetes, hyperlipidemia, stroke, paroxysmal atrial fibrillation not currently anticoagulated because of recent hemorrhagic stroke and was transferred from skilled nursing facility because of hypoxia.  He was reported to have oxygen saturation of 84% on room air which only improved to 88 percent on 4 L of nasal oxygen.  Per EMS, he was not able to tolerate CPAP.  He did vomit, and oxygen saturations actually improved following that.  Of note, he is getting feeding through her G-tube.   Past Medical History:  Diagnosis Date   ALLERGIC RHINITIS 10/13/2007   ANEMIA, CHRONIC DISEASE NEC 03/31/2007   BENIGN PROSTATIC HYPERTROPHY 03/31/2007   Chest pain    Stress echo, normal, December, 2012   Chronic anemia    DIABETES MELLITUS, TYPE II 03/31/2007   [place] DISEASE, LUMBAR 03/31/2007   ED (erectile dysfunction)    Ejection fraction    EF  normal, stress echo, December,  2012   History of prostatitis    HYPERLIPIDEMIA 03/31/2007   HYPERTENSION 03/31/2007   Nephrolithiasis 12/07/2010   Obesity    POLYP, ANAL AND RECTAL 03/31/2007   Right eye trauma    hx as child   Stroke [facility])     Patient Active Problem List   Diagnosis Date Noted   Pressure injury of skin 02/06/2021   SVT (supraventricular tachycardia) ([place])    ICH (intracerebral hemorrhage) ([place]) 01/23/2021   Skin lesion of right leg 11/14/2020   Uncontrolled type 2 diabetes mellitus with circulatory disorder causing erectile dysfunction ([place]) 11/14/2020   Bee sting reaction 07/10/2020   Vitamin D deficiency 07/10/2020   Right  lumbar radiculopathy 10/20/2019   Insomnia 01/31/2019   Acute ischemic left ACA stroke ([place]) 06/23/2018   History of CVA (cerebrovascular accident)    PAF (paroxysmal atrial fibrillation) ([place])    Diastolic dysfunction    Acute CVA (cerebrovascular accident) ([place]) 04/09/2018   Cerebral embolism with cerebral infarction 04/02/2018   Right leg pain 12/04/2017   Grief 12/04/2017   Poorly controlled type 2 diabetes mellitus with circulatory disorder ([place]) 10/02/2016   Allergic conjunctivitis 01/12/2016   Abdominal pain, other specified site 06/23/2012   Erectile dysfunction 12/13/2011   Ejection fraction    Fatigue 06/14/2011   Encounter for well adult exam with abnormal findings 06/10/2011   Nephrolithiasis 12/07/2010   Hypertensive crisis 12/07/2010   SKIN LESION 03/01/2008   ALLERGIC RHINITIS 10/13/2007   Mixed hyperlipidemia 03/31/2007   Overweight (BMI 25.0-29.9) 03/31/2007   ANEMIA, CHRONIC DISEASE NEC 03/31/2007   POLYP, ANAL AND RECTAL 03/31/2007   BENIGN PROSTATIC HYPERTROPHY 03/31/2007   [place] DISEASE, LUMBAR 03/31/2007    Past Surgical History:  Procedure Laterality Date   IR GASTROSTOMY TUBE MOD SED  02/08/2021   LUMBAR DISC SURGERY     1999   PROSTATE BIOPSY     s/p   RECTAL POLYPECTOMY     Transanal excision 10/2005   SKIN BIOPSY     s/p right upper back 2009- benign Dr. Tonia Brooms       Family History  Problem Relation Age of  Onset   Colon polyps Mother    Stroke Mother        due to ICA stenosis   CVA Neg Hx    Seizures Neg Hx     Social History   Tobacco Use   Smoking status: Former    Years: 10.00    Pack years: 0.00    Types: Cigarettes    Quit date: 1995    Years since quitting: 27.4   Smokeless tobacco: Never  Vaping Use   Vaping Use: Never used  Substance Use Topics   Alcohol use: Yes    Alcohol/week: 1.0 standard drink    Types: 1 Cans of beer per week    Comment: occasional or yearly   Drug use: No    Home Medications Prior to  Admission medications   Medication Sig Start Date End Date Taking? Authorizing Provider  acetaminophen (TYLENOL) 325 MG tablet Take 2 tablets (650 mg total) by mouth every 4 (four) hours as needed for mild pain or fever (or temp > 37.5 C (99.5 F)). 06/27/18   Roxan Hockey, MD  amiodarone (PACERONE) 200 MG tablet Place 1 tablet (200 mg total) into feeding tube daily. 02/28/21   Charlynne Cousins, MD  amLODipine (NORVASC) 10 MG tablet TAKE 1 TABLET BY MOUTH EVERY DAY 01/09/21   Fay Records, MD  atorvastatin (LIPITOR) 80 MG tablet TAKE 1 TABLET BY MOUTH EVERY DAY IN THE EVENING AT 6PM 10/31/20   [name], MD  Blood Glucose Monitoring Suppl [facility] VERIO) w/Device KIT Use as directed twice daily E11.9 08/11/19   [name], MD  carvedilol (COREG) 12.5 MG tablet Take 1 tablet (12.5 mg total) by mouth 2 (two) times daily with a meal. 11/14/20   [name], MD  gabapentin (NEURONTIN) 100 MG capsule TAKE 1 CAPSULE BY MOUTH THREE TIMES A DAY 11/10/20   [name], MD  insulin aspart (NOVOLOG) 100 UNIT/ML injection Inject 3 Units into the skin 3 (three) times daily. Hold for blood glucose less than 160. 02/28/21   Charlynne Cousins, MD  insulin detemir (LEVEMIR) 100 UNIT/ML injection Inject 0.15 mLs (15 Units total) into the skin 2 (two) times daily. 02/28/21   Charlynne Cousins, MD  Insulin Pen Needle 32G X 4 MM MISC Use 1x a day 01/03/21   Philemon Kingdom, MD  KLOR-CON M20 20 MEQ tablet TAKE 1 TABLET (20 MEQ TOTAL) BY MOUTH 2 (TWO) TIMES A WEEK. 02/06/21   Fay Records, MD  Lancets MISC Use as directed twice daily E11.9 08/11/19   [name], MD  losartan (COZAAR) 100 MG tablet TAKE 1 TABLET BY MOUTH EVERY DAY 09/27/20   [name], MD  metFORMIN (GLUCOPHAGE) 1000 MG tablet Take 1 tablet (1,000 mg total) by mouth 2 (two) times daily with a meal. 01/03/21   Philemon Kingdom, MD  [facility] VERIO test strip USE AS DIRECTED TWICE DAILY E11.9 08/19/20   [name], MD  OVER THE  COUNTER MEDICATION Place 1 drop into both eyes daily as needed (dry eyes). Over the counter lubricating eye drop    [provider]  sitaGLIPtin (JANUVIA) 100 MG tablet Place 1 tablet (100 mg total) into feeding tube daily. 02/28/21   Charlynne Cousins, MD  Water For Irrigation, Sterile (FREE WATER) SOLN Place 100 mLs into feeding tube every 4 (four) hours. 02/28/21   Charlynne Cousins, MD    Allergies    Patient has no known  allergies.  Review of Systems   Review of Systems  Unable to perform ROS: Patient nonverbal  Respiratory:  Positive for shortness of breath.    Physical Exam Updated Vital Signs BP (!) 152/72 (BP Location: Left Arm)   Pulse (!) 105   Temp (!) 101.3 F (38.5 C) (Rectal)   Resp 20   Ht 6' (1.829 m)   Wt 101.8 kg   SpO2 97%   BMI 30.44 kg/m   Physical Exam Vitals and nursing note reviewed.  85 year old male, resting comfortably and in no acute distress. Vital signs are significant for elevated temperature, heart rate, blood pressure. Oxygen saturation is 97%, which is normal. Head is normocephalic and atraumatic. PERRLA, EOMI. Oropharynx is clear. Neck is nontender and supple without adenopathy or JVD. Back is nontender and there is no CVA tenderness. Lungs have coarse breath sounds without overt rales, wheezes, or rhonchi. Chest is nontender. Heart has regular rate and rhythm without murmur. Abdomen is soft, flat, nontender without masses .  G-tube is present. Extremities have no cyanosis or edema. Skin is warm and dry without rash. Neurologic: Awake and alert but nonverbal, does not follow commands well.  ED Results / Procedures / Treatments   Labs (all labs ordered are listed, but only abnormal results are displayed) Labs Reviewed  CBG MONITORING, ED - Abnormal; Notable for the following components:      Result Value   Glucose-Capillary 182 (*)    All other components within normal limits  RESP PANEL BY RT-PCR (FLU A&B, COVID) [id number]   CULTURE, BLOOD (ROUTINE X 2)  CULTURE, BLOOD (ROUTINE X 2)  URINE CULTURE  LACTIC ACID, PLASMA  LACTIC ACID, PLASMA  COMPREHENSIVE METABOLIC PANEL  CBC WITH DIFFERENTIAL/PLATELET  PROTIME-INR  APTT  URINALYSIS, ROUTINE W REFLEX MICROSCOPIC    EKG None  Radiology DG Chest Port 1 View  Result Date: 03/08/2021 CLINICAL DATA:  Questionable sepsis. Shortness of breath. Low oxygen saturations. EXAM: PORTABLE CHEST 1 VIEW COMPARISON:  Chest x-[place] 02/11/2021. FINDINGS: Borderline cardiomegaly. Bilateral pulmonary infiltrates/edema. No pleural effusion or pneumothorax. Degenerative change thoracic spine. IMPRESSION: Borderline cardiomegaly.  Bilateral pulmonary infiltrates/edema. Electronically Signed   By: Marcello Moores  Register   On: 03/08/2021 06:00    Procedures Procedures  CRITICAL CARE Performed by: Delora Fuel Total critical care time: 35 minutes Critical care time was exclusive of separately billable procedures and treating other patients. Critical care was necessary to treat or prevent imminent or life-threatening deterioration. Critical care was time spent personally by me on the following activities: development of treatment plan with patient and/or surrogate as well as nursing, discussions with consultants, evaluation of patient's response to treatment, examination of patient, obtaining history from patient or surrogate, ordering and performing treatments and interventions, ordering and review of laboratory studies, ordering and review of radiographic studies, pulse oximetry and re-evaluation of patient's condition.  Medications Ordered in ED Medications  lactated ringers infusion (has no administration in time range)  cefTRIAXone (ROCEPHIN) 2 g in sodium chloride 0.9 % 100 mL IVPB (has no administration in time range)  azithromycin (ZITHROMAX) 500 mg in sodium chloride 0.9 % 250 mL IVPB (has no administration in time range)  lactated ringers bolus 1,000 mL (has no administration in  time range)    ED Course  I have reviewed the triage vital signs and the nursing notes.  Pertinent labs & imaging results that were available during my care of the patient were reviewed by me and considered in my medical decision  making (see chart for details).   MDM Rules/Calculators/A&P                         Fever and initial hypoxia which improved.  I suspect the mucous plug was dislodged.  However, presentation is certainly concerning for pneumonia.  He is started on evolving sepsis pathway and is started empirically on antibiotics.  ECG obtained in the ED is unchanged from prior.  Please note that computer reading states acute inferior infarct, this is not an accurate reading and there are no ECG findings suggestive of myocardial infarction.  Chest x-Brenen does show bilateral infiltrates.  Labs are pending.  Case is signed out to Dr. Francia Greaves.  Final Clinical Impression(s) / ED Diagnoses Final diagnoses:  Community acquired pneumonia, unspecified laterality    Rx / DC Orders ED Discharge Orders     None        Delora Fuel, MD 10/09/77 [phone]

## 2021-03-08 NOTE — ED Notes (Signed)
Floor nurse notified and aware of pt's 100.57F axillary temp. Pt would not cooperate for oral temp. Pt departed the dept via strecther with ED nurse [place], [place] transporting.

## 2021-03-09 ENCOUNTER — Inpatient Hospital Stay (HOSPITAL_COMMUNITY): Payer: Medicare HMO

## 2021-03-09 DIAGNOSIS — J189 Pneumonia, unspecified organism: Secondary | ICD-10-CM

## 2021-03-09 LAB — CBC
HCT: 33.1 % — ABNORMAL LOW (ref 39.0–52.0)
Hemoglobin: 9.4 g/dL — ABNORMAL LOW (ref 13.0–17.0)
MCH: 25.3 pg — ABNORMAL LOW (ref 26.0–34.0)
MCHC: 28.4 g/dL — ABNORMAL LOW (ref 30.0–36.0)
MCV: 89 fL (ref 80.0–100.0)
Platelets: 153 10*3/uL (ref 150–400)
RBC: 3.72 MIL/uL — ABNORMAL LOW (ref 4.22–5.81)
RDW: 16.8 % — ABNORMAL HIGH (ref 11.5–15.5)
WBC: 8.6 10*3/uL (ref 4.0–10.5)
nRBC: 0 % (ref 0.0–0.2)

## 2021-03-09 LAB — GLUCOSE, CAPILLARY
Glucose-Capillary: 126 mg/dL — ABNORMAL HIGH (ref 70–99)
Glucose-Capillary: 127 mg/dL — ABNORMAL HIGH (ref 70–99)
Glucose-Capillary: 134 mg/dL — ABNORMAL HIGH (ref 70–99)
Glucose-Capillary: 134 mg/dL — ABNORMAL HIGH (ref 70–99)
Glucose-Capillary: 153 mg/dL — ABNORMAL HIGH (ref 70–99)
Glucose-Capillary: 156 mg/dL — ABNORMAL HIGH (ref 70–99)

## 2021-03-09 LAB — COMPREHENSIVE METABOLIC PANEL
ALT: 25 U/L (ref 0–44)
AST: 28 U/L (ref 15–41)
Albumin: 2.8 g/dL — ABNORMAL LOW (ref 3.5–5.0)
Alkaline Phosphatase: 60 U/L (ref 38–126)
Anion gap: 10 (ref 5–15)
BUN: 43 mg/dL — ABNORMAL HIGH (ref 8–23)
CO2: 24 mmol/L (ref 22–32)
Calcium: 9.1 mg/dL (ref 8.9–10.3)
Chloride: 109 mmol/L (ref 98–111)
Creatinine, Ser: 1.3 mg/dL — ABNORMAL HIGH (ref 0.61–1.24)
GFR, Estimated: 55 mL/min — ABNORMAL LOW (ref >60)
Glucose, Bld: 158 mg/dL — ABNORMAL HIGH (ref 70–99)
Potassium: 4.1 mmol/L (ref 3.5–5.1)
Sodium: 143 mmol/L (ref 135–145)
Total Bilirubin: 0.8 mg/dL (ref 0.3–1.2)
Total Protein: 6.4 g/dL — ABNORMAL LOW (ref 6.5–8.1)

## 2021-03-09 LAB — BRAIN NATRIURETIC PEPTIDE: B Natriuretic Peptide: 71.4 pg/mL (ref 0.0–100.0)

## 2021-03-09 LAB — STREP PNEUMONIAE URINARY ANTIGEN: Strep Pneumo Urinary Antigen: NEGATIVE

## 2021-03-09 MED ORDER — SODIUM CHLORIDE 0.9 % IN NEBU
3.0000 mL | INHALATION_SOLUTION | Freq: Four times a day (QID) | RESPIRATORY_TRACT | Status: DC | PRN
  Filled 2021-03-09: qty 3

## 2021-03-09 MED ORDER — SODIUM CHLORIDE 0.9 % IV SOLN
3.0000 g | Freq: Four times a day (QID) | INTRAVENOUS | Status: DC
  Administered 2021-03-09 – 2021-03-15 (×23): 3 g via INTRAVENOUS
  Filled 2021-03-09: qty 8
  Filled 2021-03-09: qty 3
  Filled 2021-03-09 (×2): qty 8
  Filled 2021-03-09 (×2): qty 3
  Filled 2021-03-09: qty 8
  Filled 2021-03-09 (×3): qty 3
  Filled 2021-03-09: qty 8
  Filled 2021-03-09 (×3): qty 3
  Filled 2021-03-09: qty 8
  Filled 2021-03-09 (×2): qty 3
  Filled 2021-03-09: qty 8
  Filled 2021-03-09: qty 3
  Filled 2021-03-09 (×2): qty 8
  Filled 2021-03-09: qty 3
  Filled 2021-03-09: qty 8
  Filled 2021-03-09: qty 3

## 2021-03-09 NOTE — Progress Notes (Signed)
1505 [place] vital signs late due to waiting on chest physiotherapy to be complete.  Virginia Rochester, RN

## 2021-03-09 NOTE — Progress Notes (Signed)
Pharmacy Antibiotic Note  Brian [name] is a 85 y.o. male admitted on 03/08/2021 with aspiration PNA.  Pharmacy has been consulted for Unasyn dosing.  Plan: Unasyn 3 gm IV q6h Pharmacy to sign off  Height: 6' (182.9 cm) Weight: 95.4 kg (210 lb 5.1 oz) IBW/kg (Calculated) : 77.6  Temp (24hrs), Avg:100.7 F (38.2 C), Min:99 F (37.2 C), Max:103.1 F (39.5 C)  Recent Labs  Lab 03/08/21 0716 03/08/21 0800 03/08/21 1312 03/09/21 0602  WBC 6.8  --   --  8.6  CREATININE  --  1.37*  --  1.30*  LATICACIDVEN  --  1.9 2.0*  --     Estimated Creatinine Clearance: 51.6 mL/min (A) (by C-G formula based on SCr of 1.3 mg/dL (H)).    No Known Allergies  Thank you for allowing pharmacy to be a part of this patient's care.  Eudelia Bunch, Pharm.D 03/09/2021 7:02 PM

## 2021-03-09 NOTE — Progress Notes (Signed)
[facility]  PROGRESS NOTE  Brian [name] PNT:[id number] DOB: 10-05-1935 DOA: 03/08/2021 PCP: [name], MD   Brief HPI:   85 year old male with medical history of atrial fibrillation, ICH/stroke, diabetes mellitus type 2, dysphagia presents with acute hypoxemic respiratory failure.  Patient is currently at rehab, has been having intermittent lung congestion.  Apparently patient became nauseous and started vomiting.  After that he was noted to be hypoxemic with room air sats of 84%.  He was started on 4 L oxygen via [facility].  EMS was called.  Chest x-Jcion showed pneumonia.  Patient started on Rocephin.    Subjective   Patient is nonverbal, unable to cough out phlegm.  Still having high fever.  T-max 103.1.   Assessment/Plan:     Acute hypoxemic respiratory failure -Secondary to aspiration pneumonia -Patient still spiking fever, T-max 103.1 -He was started on ceftriaxone and Zithromax, will discontinue ceftriaxone and start Unasyn.  Continue Zithromax. -Follow blood culture results -Patient is unable to cough up the phlegm, will obtain chest PT every 4 hours; suction as needed -Continue oxygen via nasal cannula, maintain O2 sats more than 92%  Recent hemorrhagic stroke -Patient has PEG tube in place -Nonverbal, does not follow commands -Continue tube feeding  Diabetes mellitus type 2 -Continue insulin detemir 10 units subcu nightly -Sliding scale insulin NovoLog -CBG well controlled  Acute kidney injury -Creatinine 1.30; baseline creatinine 1.0 -Was started on IV fluids, IV fluids discontinued for concern for developing pulmonary edema -We will follow BMP in am     Scheduled medications:    free water  100 mL Per Tube Q4H   insulin aspart  0-15 Units Subcutaneous TID WC   insulin aspart  0-5 Units Subcutaneous QHS   insulin detemir  10 Units Subcutaneous QHS         Data Reviewed:   CBG:  Recent Labs  Lab 03/09/21 0005 03/09/21 0506 03/09/21 0726  03/09/21 1124 03/09/21 1705  GLUCAP 153* 156* 134* 134* 126*    SpO2: 94 % O2 Flow Rate (L/min): 3 L/min    Vitals:   03/09/21 1406 03/09/21 1522 03/09/21 1648 03/09/21 1706  BP: (!) 151/84 139/70 (!) 144/66 (!) 145/84  Pulse: (!) 103 98 90 88  Resp: 20 20 20 20   Temp: (!) 103.1 F (39.5 C) (!) 101.3 F (38.5 C) 100 F (37.8 C) (!) 100.9 F (38.3 C)  TempSrc: Oral Oral Oral Oral  SpO2: 96% 95% 97% 94%  Weight:      Height:         Intake/Output Summary (Last 24 hours) at 03/09/2021 1816 Last data filed at 03/09/2021 0518 Gross per 24 hour  Intake --  Output 350 ml  Net -350 ml    06/21 1901 - 06/23 0700 In: 1258.5  Out: 350 [Urine:350]  Filed Weights   03/08/21 0453 03/09/21 0500  Weight: 101.8 kg 95.4 kg    CBC:  Recent Labs  Lab 03/08/21 0716 03/09/21 0602  WBC 6.8 8.6  HGB 11.0* 9.4*  HCT 38.2* 33.1*  PLT 171 153  MCV 87.8 89.0  MCH 25.3* 25.3*  MCHC 28.8* 28.4*  RDW 16.7* 16.8*  LYMPHSABS 0.7  --   MONOABS 0.5  --   EOSABS 0.0  --   BASOSABS 0.0  --     Complete metabolic panel:  Recent Labs  Lab 03/08/21 0716 03/08/21 0800 03/08/21 1312 03/09/21 0602 03/09/21 1118  NA  --  139  --  143  --  K  --  5.3*  --  4.1  --   CL  --  106  --  109  --   CO2  --  23  --  24  --   GLUCOSE  --  190*  --  158*  --   BUN  --  40*  --  43*  --   CREATININE  --  1.37*  --  1.30*  --   CALCIUM  --  8.9  --  9.1  --   AST  --  44*  --  28  --   ALT  --  25  --  25  --   ALKPHOS  --  61  --  60  --   BILITOT  --  1.6*  --  0.8  --   ALBUMIN  --  3.0*  --  2.8*  --   LATICACIDVEN  --  1.9 2.0*  --   --   INR 1.3*  --   --   --   --   BNP  --   --   --   --  71.4    No results for input(s): LIPASE, AMYLASE in the last 168 hours.  Recent Labs  Lab 03/08/21 0517 03/09/21 1118  BNP  --  71.4  SARSCOV2NAA NEGATIVE  --     ------------------------------------------------------------------------------------------------------------------ No  results for input(s): CHOL, HDL, LDLCALC, TRIG, CHOLHDL, LDLDIRECT in the last 72 hours.  Lab Results  Component Value Date   HGBA1C 6.1 (H) 01/28/2021   ------------------------------------------------------------------------------------------------------------------ No results for input(s): TSH, T4TOTAL, T3FREE, THYROIDAB in the last 72 hours.  Invalid input(s): FREET3 ------------------------------------------------------------------------------------------------------------------ No results for input(s): VITAMINB12, FOLATE, FERRITIN, TIBC, IRON, RETICCTPCT in the last 72 hours.  Coagulation profile Recent Labs  Lab 03/08/21 0716  INR 1.3*   No results for input(s): DDIMER in the last 72 hours.  Cardiac Enzymes No results for input(s): CKTOTAL, CKMB, CKMBINDEX, TROPONINI in the last 168 hours.  ------------------------------------------------------------------------------------------------------------------    Component Value Date/Time   BNP 71.4 03/09/2021 1118     Antibiotics: Anti-infectives (From admission, onward)    Start     Dose/Rate Route Frequency Ordered Stop   03/08/21 0645  amoxicillin (AMOXIL) capsule 1,000 mg  Status:  Discontinued        1,000 mg Oral  Once 03/08/21 0635 03/08/21 0636   03/08/21 0530  cefTRIAXone (ROCEPHIN) 2 g in sodium chloride 0.9 % 100 mL IVPB  Status:  Discontinued        2 g 200 mL/hr over 30 Minutes Intravenous Every 24 hours 03/08/21 0518 03/09/21 1811   03/08/21 0530  azithromycin (ZITHROMAX) 500 mg in sodium chloride 0.9 % 250 mL IVPB        500 mg 250 mL/hr over 60 Minutes Intravenous Every 24 hours 03/08/21 0518     03/08/21 0000  amoxicillin (AMOXIL) 500 MG capsule  Status:  Discontinued        1,000 mg Oral 2 times daily 03/08/21 8185 03/08/21         Radiology Reports  DG Chest Port 1 View  Result Date: 03/09/2021 CLINICAL DATA:  Dyspnea. EXAM: PORTABLE CHEST 1 VIEW COMPARISON:  03/08/2021 FINDINGS: Shallow lung  inflation. Heart size is enlarged and accentuated by AP technique. There has been some improvement in aeration of both lungs. Persistent airspace filling opacities are identified in the LOWER lungs. IMPRESSION: Improved aeration.  Persistent infiltrates bilaterally. Electronically Signed   By: Benjamine Mola  Owens Shark M.D.   On: 03/09/2021 11:20   DG Chest Port 1 View  Result Date: 03/08/2021 CLINICAL DATA:  Questionable sepsis. Shortness of breath. Low oxygen saturations. EXAM: PORTABLE CHEST 1 VIEW COMPARISON:  Chest x-Jep 02/11/2021. FINDINGS: Borderline cardiomegaly. Bilateral pulmonary infiltrates/edema. No pleural effusion or pneumothorax. Degenerative change thoracic spine. IMPRESSION: Borderline cardiomegaly.  Bilateral pulmonary infiltrates/edema. Electronically Signed   By: Marcello Moores  Register   On: 03/08/2021 06:00      DVT prophylaxis: SCDs  Code Status: Full code  Family Communication: No family at bedside   Consultants:   Procedures:     Objective    Physical Examination:   General: Appears lethargic Cardiovascular: S1-S2, regular, no murmur auscultated Respiratory: Bilateral rhonchi auscultated Abdomen: Abdomen is soft, nontender, no organomegaly Extremities: No edema in the lower extremities Neurologic: Alert, nonverbal, does not follow commands   Status is: Inpatient  Dispo: The patient is from: Rehab              Anticipated d/c is to: Skilled nursing facility              Anticipated d/c date is: 03/14/2021              Patient currently not stable for discharge  Barrier to discharge-ongoing treatment for aspiration pneumonia  COVID-19 Labs  No results for input(s): DDIMER, FERRITIN, LDH, CRP in the last 72 hours.  Lab Results  Component Value Date   [place] NEGATIVE 03/08/2021   [place] NEGATIVE 02/27/2021   [place] NEGATIVE 01/23/2021    Microbiology  Recent Results (from the past 240 hour(s))  Resp Panel by RT-PCR (Flu A&B, Covid)  Nasopharyngeal Swab     Status: None   Collection Time: 03/08/21  5:17 AM   Specimen: Nasopharyngeal Swab; Nasopharyngeal(NP) swabs in vial transport medium  Result Value Ref Range Status   SARS Coronavirus 2 by RT PCR NEGATIVE NEGATIVE Final    Comment: (NOTE) SARS-CoV-2 target nucleic acids are NOT DETECTED.  The SARS-CoV-2 RNA is generally detectable in upper respiratory specimens during the acute phase of infection. The lowest concentration of SARS-CoV-2 viral copies this assay can detect is 138 copies/mL. A negative result does not preclude SARS-Cov-2 infection and should not be used as the sole basis for treatment or other patient management decisions. A negative result may occur with  improper specimen collection/handling, submission of specimen other than nasopharyngeal swab, presence of viral mutation(s) within the areas targeted by this assay, and inadequate number of viral copies(<138 copies/mL). A negative result must be combined with clinical observations, patient history, and epidemiological information. The expected result is Negative.  Fact Sheet for Patients:  EntrepreneurPulse.com.au  Fact Sheet for Healthcare Providers:  IncredibleEmployment.be  This test is no t yet approved or cleared by the [place] FDA and  has been authorized for detection and/or diagnosis of SARS-CoV-2 by FDA under an Emergency Use Authorization (EUA). This EUA will remain  in effect (meaning this test can be used) for the duration of the COVID-19 declaration under Section 564(b)(1) of the Act, 21 U.S.C.section 360bbb-3(b)(1), unless the authorization is terminated  or revoked sooner.       Influenza A by PCR NEGATIVE NEGATIVE Final   Influenza B by PCR NEGATIVE NEGATIVE Final    Comment: (NOTE) The Xpert Xpress SARS-CoV-2/FLU/RSV plus assay is intended as an aid in the diagnosis of influenza from Nasopharyngeal swab specimens and should not be  used as a sole basis for treatment. Nasal washings and aspirates are unacceptable  for Xpert Xpress SARS-CoV-2/FLU/RSV testing.  Fact Sheet for Patients: EntrepreneurPulse.com.au  Fact Sheet for Healthcare Providers: IncredibleEmployment.be  This test is not yet approved or cleared by the [place] FDA and has been authorized for detection and/or diagnosis of SARS-CoV-2 by FDA under an Emergency Use Authorization (EUA). This EUA will remain in effect (meaning this test can be used) for the duration of the COVID-19 declaration under Section 564(b)(1) of the Act, 21 U.S.C. section 360bbb-3(b)(1), unless the authorization is terminated or revoked.  Performed at [facility], [place] [address], [place], [place] [address]   Blood Culture (routine x 2)     Status: None (Preliminary result)   Collection Time: 03/08/21  5:23 AM   Specimen: BLOOD RIGHT HAND  Result Value Ref Range Status   Specimen Description   Final    BLOOD RIGHT HAND Performed at [facility] [address] [place], [place] [address]    Special Requests   Final    BOTTLES DRAWN AEROBIC AND ANAEROBIC Blood Culture results may not be optimal due to an inadequate volume of blood received in culture bottles Performed at [facility] [address], [place], [place] [address]    Culture   Final    NO GROWTH 1 DAY Performed at [facility], [place] [address] [place], [place] [address]    Report Status PENDING  Incomplete  Blood Culture (routine x 2)     Status: None (Preliminary result)   Collection Time: 03/08/21  1:12 PM   Specimen: BLOOD LEFT HAND  Result Value Ref Range Status   Specimen Description   Final    BLOOD LEFT HAND Performed at [facility] [address], [place], [place] [address]    Special Requests   Final    BOTTLES DRAWN AEROBIC ONLY Blood Culture adequate volume Performed at  [facility] [address], [place], [place] [address]    Culture   Final    NO GROWTH < 24 HOURS Performed at [facility] [address], [place], [place] [address]    Report Status PENDING  Incomplete    Pressure Injury 02/05/21 Buttocks Left Stage 2 -  Partial thickness loss of dermis presenting as a shallow open injury with a red, pink wound bed without slough. blister ruptured on left buttock, serosanguinous drainage (Active)  02/05/21 1613  Location: Buttocks  Location Orientation: Left  Staging: Stage 2 -  Partial thickness loss of dermis presenting as a shallow open injury with a red, pink wound bed without slough.  Wound Description (Comments): blister ruptured on left buttock, serosanguinous drainage  Present on Admission: No          Whitney   [facility] If 7PM-7AM, please contact night-coverage at [email or web address], Office  [phone]   03/09/2021, 6:16 PM  LOS: 1 day

## 2021-03-09 NOTE — Progress Notes (Signed)
   03/09/21 1406  Assess: MEWS Score  Temp (!) 103.1 F (39.5 C)  BP (!) 151/84  Pulse Rate (!) 103  Resp 20  SpO2 96 %  Assess: MEWS Score  MEWS Temp 2  MEWS Systolic 0  MEWS Pulse 1  MEWS RR 0  MEWS LOC 2  MEWS Score 5  MEWS Score Color Red  Assess: if the MEWS score is Yellow or Red  Were vital signs taken at a resting state? Yes  Focused Assessment No change from prior assessment  Does the patient meet 2 or more of the SIRS criteria? Yes  Does the patient have a confirmed or suspected source of infection? Yes  Provider and Rapid Response Notified? Yes  Early Detection of Sepsis Score *See Row Information* High  MEWS guidelines implemented *See Row Information* Yes  Treat  MEWS Interventions Administered prn meds/treatments  Pain Scale Faces  Faces Pain Scale 2  Pain Type Acute pain  Pain Location Generalized  Pain Intervention(s) Repositioned;Rest  Take Vital Signs  Increase Vital Sign Frequency  Red: Q 1hr X 4 then Q 4hr X 4, if remains red, continue Q 4hrs  Escalate  MEWS: Escalate Red: discuss with charge nurse/RN and provider, consider discussing with RRT  Notify: Charge Nurse/RN  Name of Charge Nurse/RN Notified RN Jarrett Soho  Date Charge Nurse/RN Notified 03/09/21  Time Charge Nurse/RN Notified 1407  Notify: Provider  Provider Name/Title Dr.Lama  Date Provider Notified 03/09/21  Time Provider Notified 1430  Notification Type Page  Notification Reason Change in status  Provider response En route  Date of Provider Response 03/09/21  Time of Provider Response 1433  Notify: Rapid Response  Name of Rapid Response RN Notified J. Polly Cobia, RN  Date Rapid Response Notified 03/09/21  Time Rapid Response Notified 1410  Document  Patient Outcome Stabilized after interventions  Progress note created (see row info) Yes  Assess: SIRS CRITERIA  SIRS Temperature  1  SIRS Pulse 1  SIRS Respirations  0  SIRS WBC 0  SIRS Score Sum  2    Dr. Darrick Meigs came to assess  the patient after chest physiotherapy.  Patient remains on unit at this time.  Nursing staff will continue to monitor and assess for any further needed interventions.  Charge nurse, Jarrett Soho made aware.  Shelah Lewandowsky, RN

## 2021-03-09 NOTE — Progress Notes (Signed)
0830 Pt responding to voice but not tracking and tends to lean to left side.  At this time pt presents same even after CPT and acetaminophen suppository.  MD [place] saw pt and discussed further treatment with staff in room. (RN Kennyth Lose, RN Jarrett Soho)  Continue to monitor.

## 2021-03-10 LAB — COMPREHENSIVE METABOLIC PANEL
ALT: 46 U/L — ABNORMAL HIGH (ref 0–44)
AST: 48 U/L — ABNORMAL HIGH (ref 15–41)
Albumin: 3 g/dL — ABNORMAL LOW (ref 3.5–5.0)
Alkaline Phosphatase: 72 U/L (ref 38–126)
Anion gap: 8 (ref 5–15)
BUN: 36 mg/dL — ABNORMAL HIGH (ref 8–23)
CO2: 25 mmol/L (ref 22–32)
Calcium: 9.3 mg/dL (ref 8.9–10.3)
Chloride: 111 mmol/L (ref 98–111)
Creatinine, Ser: 1 mg/dL (ref 0.61–1.24)
GFR, Estimated: >60 mL/min (ref >60)
Glucose, Bld: 167 mg/dL — ABNORMAL HIGH (ref 70–99)
Potassium: 4 mmol/L (ref 3.5–5.1)
Sodium: 144 mmol/L (ref 135–145)
Total Bilirubin: 0.6 mg/dL (ref 0.3–1.2)
Total Protein: 6.8 g/dL (ref 6.5–8.1)

## 2021-03-10 LAB — BLOOD CULTURE ID PANEL (REFLEXED) - BCID2

## 2021-03-10 LAB — CBC
HCT: 31.7 % — ABNORMAL LOW (ref 39.0–52.0)
Hemoglobin: 9 g/dL — ABNORMAL LOW (ref 13.0–17.0)
MCH: 25.5 pg — ABNORMAL LOW (ref 26.0–34.0)
MCHC: 28.4 g/dL — ABNORMAL LOW (ref 30.0–36.0)
MCV: 89.8 fL (ref 80.0–100.0)
Platelets: 179 10*3/uL (ref 150–400)
RBC: 3.53 MIL/uL — ABNORMAL LOW (ref 4.22–5.81)
RDW: 16.9 % — ABNORMAL HIGH (ref 11.5–15.5)
WBC: 8 10*3/uL (ref 4.0–10.5)
nRBC: 0.2 % (ref 0.0–0.2)

## 2021-03-10 LAB — GLUCOSE, CAPILLARY
Glucose-Capillary: 141 mg/dL — ABNORMAL HIGH (ref 70–99)
Glucose-Capillary: 153 mg/dL — ABNORMAL HIGH (ref 70–99)
Glucose-Capillary: 137 mg/dL — ABNORMAL HIGH (ref 70–99)
Glucose-Capillary: 130 mg/dL — ABNORMAL HIGH (ref 70–99)

## 2021-03-10 LAB — LEGIONELLA PNEUMOPHILA SEROGP 1 UR AG: L. pneumophila Serogp 1 Ur Ag: NEGATIVE

## 2021-03-10 LAB — URINE CULTURE: Culture: NO GROWTH

## 2021-03-10 NOTE — Progress Notes (Signed)
[facility]  PROGRESS NOTE  Brian [name]:[id number] DOB: October 25, 1935 DOA: 03/08/2021 PCP: [name], MD   Brief HPI:   85 year old male with medical history of atrial fibrillation, ICH/stroke, diabetes mellitus type 2, dysphagia presents with acute hypoxemic respiratory failure.  Patient is currently at rehab, has been having intermittent lung congestion.  Apparently patient became nauseous and started vomiting.  After that he was noted to be hypoxemic with room air sats of 84%.  He was started on 4 L oxygen via [place].  EMS was called.  Chest x-Jsiah showed pneumonia.  Patient started on Rocephin.    Subjective   Patient seen, more alert today.  Does not appear to be in respiratory distress.   Assessment/Plan:     Acute hypoxemic respiratory failure -Secondary to aspiration pneumonia -Patient continues to spike fever despite being on antibiotics -He was started on ceftriaxone and Zithromax, ceftriaxone was discontinued and patient started on Unasyn and Zithromax. -Follow blood culture results -Patient is unable to cough up the phlegm, will obtain chest PT every 4 hours; suction as needed -Continue oxygen via nasal cannula, maintain O2 sats more than 92%  Recent hemorrhagic stroke -Patient has PEG tube in place -Nonverbal, does not follow commands -Continue tube feeding  Diabetes mellitus type 2 -Continue insulin detemir 10 units subcu nightly -Sliding scale insulin NovoLog -CBG well controlled  Acute kidney injury -Creatinine 1.30; baseline creatinine 1.0 -Was started on IV fluids, IV fluids discontinued for concern for developing pulmonary edema -Creatinine this morning is back to baseline 1.0     Scheduled medications:    free water  100 mL Per Tube Q4H   insulin aspart  0-15 Units Subcutaneous TID WC   insulin aspart  0-5 Units Subcutaneous QHS   insulin detemir  10 Units Subcutaneous QHS         Data Reviewed:   CBG:  Recent Labs  Lab  03/09/21 1124 03/09/21 1705 03/09/21 1956 03/10/21 0724 03/10/21 1138  GLUCAP 134* 126* 127* 141* 153*    SpO2: 98 % O2 Flow Rate (L/min): 3 L/min    Vitals:   03/10/21 0224 03/10/21 0500 03/10/21 0559 03/10/21 1006  BP: (!) 150/67  (!) 157/71 (!) 150/78  Pulse: 77  84 84  Resp: 16  18 20   Temp: 99.3 F (37.4 C)  99 F (37.2 C) (!) 100.5 F (38.1 C)  TempSrc: Oral     SpO2:   96% 98%  Weight:  99.5 kg    Height:         Intake/Output Summary (Last 24 hours) at 03/10/2021 1217 Last data filed at 03/10/2021 0930 Gross per 24 hour  Intake 1030.33 ml  Output 550 ml  Net 480.33 ml    06/22 1901 - 06/24 0700 In: 1030.3  Out: 725 [Urine:725]  Filed Weights   03/08/21 0453 03/09/21 0500 03/10/21 0500  Weight: 101.8 kg 95.4 kg 99.5 kg    CBC:  Recent Labs  Lab 03/08/21 0716 03/09/21 0602 03/10/21 0446  WBC 6.8 8.6 8.0  HGB 11.0* 9.4* 9.0*  HCT 38.2* 33.1* 31.7*  PLT 171 153 179  MCV 87.8 89.0 89.8  MCH 25.3* 25.3* 25.5*  MCHC 28.8* 28.4* 28.4*  RDW 16.7* 16.8* 16.9*  LYMPHSABS 0.7  --   --   MONOABS 0.5  --   --   EOSABS 0.0  --   --   BASOSABS 0.0  --   --     Complete metabolic panel:  Recent Labs  Lab 03/08/21 0716 03/08/21 0800 03/08/21 1312 03/09/21 0602 03/09/21 1118 03/10/21 0446  NA  --  139  --  143  --  144  K  --  5.3*  --  4.1  --  4.0  CL  --  106  --  109  --  111  CO2  --  23  --  24  --  25  GLUCOSE  --  190*  --  158*  --  167*  BUN  --  40*  --  43*  --  36*  CREATININE  --  1.37*  --  1.30*  --  1.00  CALCIUM  --  8.9  --  9.1  --  9.3  AST  --  44*  --  28  --  48*  ALT  --  25  --  25  --  46*  ALKPHOS  --  61  --  60  --  72  BILITOT  --  1.6*  --  0.8  --  0.6  ALBUMIN  --  3.0*  --  2.8*  --  3.0*  LATICACIDVEN  --  1.9 2.0*  --   --   --   INR 1.3*  --   --   --   --   --   BNP  --   --   --   --  71.4  --     No results for input(s): LIPASE, AMYLASE in the last 168 hours.  Recent Labs  Lab 03/08/21 0517  03/09/21 1118  BNP  --  71.4  SARSCOV2NAA NEGATIVE  --     ------------------------------------------------------------------------------------------------------------------ No results for input(s): CHOL, HDL, LDLCALC, TRIG, CHOLHDL, LDLDIRECT in the last 72 hours.  Lab Results  Component Value Date   HGBA1C 6.1 (H) 01/28/2021   ------------------------------------------------------------------------------------------------------------------ No results for input(s): TSH, T4TOTAL, T3FREE, THYROIDAB in the last 72 hours.  Invalid input(s): FREET3 ------------------------------------------------------------------------------------------------------------------ No results for input(s): VITAMINB12, FOLATE, FERRITIN, TIBC, IRON, RETICCTPCT in the last 72 hours.  Coagulation profile Recent Labs  Lab 03/08/21 0716  INR 1.3*   No results for input(s): DDIMER in the last 72 hours.  Cardiac Enzymes No results for input(s): CKTOTAL, CKMB, CKMBINDEX, TROPONINI in the last 168 hours.  ------------------------------------------------------------------------------------------------------------------    Component Value Date/Time   BNP 71.4 03/09/2021 1118     Antibiotics: Anti-infectives (From admission, onward)    Start     Dose/Rate Route Frequency Ordered Stop   03/09/21 1915  Ampicillin-Sulbactam (UNASYN) 3 g in sodium chloride 0.9 % 100 mL IVPB        3 g 200 mL/hr over 30 Minutes Intravenous Every 6 hours 03/09/21 1818     03/08/21 0645  amoxicillin (AMOXIL) capsule 1,000 mg  Status:  Discontinued        1,000 mg Oral  Once 03/08/21 0635 03/08/21 0636   03/08/21 0530  cefTRIAXone (ROCEPHIN) 2 g in sodium chloride 0.9 % 100 mL IVPB  Status:  Discontinued        2 g 200 mL/hr over 30 Minutes Intravenous Every 24 hours 03/08/21 0518 03/09/21 1811   03/08/21 0530  azithromycin (ZITHROMAX) 500 mg in sodium chloride 0.9 % 250 mL IVPB        500 mg 250 mL/hr over 60 Minutes  Intravenous Every 24 hours 03/08/21 0518     03/08/21 0000  amoxicillin (AMOXIL) 500 MG capsule  Status:  Discontinued  1,000 mg Oral 2 times daily 03/08/21 5956 03/08/21         Radiology Reports  DG Chest Port 1 View  Result Date: 03/09/2021 CLINICAL DATA:  Dyspnea. EXAM: PORTABLE CHEST 1 VIEW COMPARISON:  03/08/2021 FINDINGS: Shallow lung inflation. Heart size is enlarged and accentuated by AP technique. There has been some improvement in aeration of both lungs. Persistent airspace filling opacities are identified in the LOWER lungs. IMPRESSION: Improved aeration.  Persistent infiltrates bilaterally. Electronically Signed   By: Nolon Nations M.D.   On: 03/09/2021 11:20      DVT prophylaxis: SCDs  Code Status: Full code  Family Communication: No family at bedside   Consultants:   Procedures:     Objective    Physical Examination:  General-appears in no acute distress Heart-S1-S2, regular, no murmur auscultated Lungs-bilateral rhonchi auscultated Abdomen-soft, nontender, no organomegaly Extremities-no edema in the lower extremities Neuro-alert, oriented x3, no focal deficit noted   Status is: Inpatient  Dispo: The patient is from: Rehab              Anticipated d/c is to: Skilled nursing facility              Anticipated d/c date is: 03/14/2021              Patient currently not stable for discharge  Barrier to discharge-ongoing treatment for aspiration pneumonia  COVID-19 Labs  No results for input(s): DDIMER, FERRITIN, LDH, CRP in the last 72 hours.  Lab Results  Component Value Date   [place] NEGATIVE 03/08/2021   [place] NEGATIVE 02/27/2021   [place] NEGATIVE 01/23/2021    Microbiology  Recent Results (from the past 240 hour(s))  Resp Panel by RT-PCR (Flu A&B, Covid) Nasopharyngeal Swab     Status: None   Collection Time: 03/08/21  5:17 AM   Specimen: Nasopharyngeal Swab; Nasopharyngeal(NP) swabs in vial transport medium   Result Value Ref Range Status   SARS Coronavirus 2 by RT PCR NEGATIVE NEGATIVE Final    Comment: (NOTE) SARS-CoV-2 target nucleic acids are NOT DETECTED.  The SARS-CoV-2 RNA is generally detectable in upper respiratory specimens during the acute phase of infection. The lowest concentration of SARS-CoV-2 viral copies this assay can detect is 138 copies/mL. A negative result does not preclude SARS-Cov-2 infection and should not be used as the sole basis for treatment or other patient management decisions. A negative result may occur with  improper specimen collection/handling, submission of specimen other than nasopharyngeal swab, presence of viral mutation(s) within the areas targeted by this assay, and inadequate number of viral copies(<138 copies/mL). A negative result must be combined with clinical observations, patient history, and epidemiological information. The expected result is Negative.  Fact Sheet for Patients:  EntrepreneurPulse.com.au  Fact Sheet for Healthcare Providers:  IncredibleEmployment.be  This test is no t yet approved or cleared by the [place] FDA and  has been authorized for detection and/or diagnosis of SARS-CoV-2 by FDA under an Emergency Use Authorization (EUA). This EUA will remain  in effect (meaning this test can be used) for the duration of the COVID-19 declaration under Section 564(b)(1) of the Act, 21 U.S.C.section 360bbb-3(b)(1), unless the authorization is terminated  or revoked sooner.       Influenza A by PCR NEGATIVE NEGATIVE Final   Influenza B by PCR NEGATIVE NEGATIVE Final    Comment: (NOTE) The Xpert Xpress SARS-CoV-2/FLU/RSV plus assay is intended as an aid in the diagnosis of influenza from Nasopharyngeal swab specimens and should  not be used as a sole basis for treatment. Nasal washings and aspirates are unacceptable for Xpert Xpress SARS-CoV-2/FLU/RSV testing.  Fact Sheet for  Patients: EntrepreneurPulse.com.au  Fact Sheet for Healthcare Providers: IncredibleEmployment.be  This test is not yet approved or cleared by the [place] FDA and has been authorized for detection and/or diagnosis of SARS-CoV-2 by FDA under an Emergency Use Authorization (EUA). This EUA will remain in effect (meaning this test can be used) for the duration of the COVID-19 declaration under Section 564(b)(1) of the Act, 21 U.S.C. section 360bbb-3(b)(1), unless the authorization is terminated or revoked.  Performed at [facility], [place] [address], [place], [place] [address]   Blood Culture (routine x 2)     Status: None (Preliminary result)   Collection Time: 03/08/21  5:23 AM   Specimen: BLOOD RIGHT HAND  Result Value Ref Range Status   Specimen Description   Final    BLOOD RIGHT HAND Performed at [facility] [address] [place], [place] [address]    Special Requests   Final    BOTTLES DRAWN AEROBIC AND ANAEROBIC Blood Culture results may not be optimal due to an inadequate volume of blood received in culture bottles Performed at [facility] [address], [place], [place] [address]    Culture   Final    NO GROWTH 1 DAY Performed at [facility], [place] [address], [place], [place] [address]    Report Status PENDING  Incomplete  Blood Culture (routine x 2)     Status: None (Preliminary result)   Collection Time: 03/08/21  1:12 PM   Specimen: BLOOD LEFT HAND  Result Value Ref Range Status   Specimen Description   Final    BLOOD LEFT HAND Performed at [facility] [address] [place], [place] [address]    Special Requests   Final    BOTTLES DRAWN AEROBIC ONLY Blood Culture adequate volume Performed at [facility] [address] [place], [place] [address]    Culture   Final    NO GROWTH < 24 HOURS Performed at [facility] [address] [place], [place] [address]    Report Status PENDING  Incomplete  Urine culture     Status: None   Collection Time: 03/08/21  7:33 PM   Specimen: In/Out Cath Urine  Result Value Ref Range Status   Specimen Description   Final    IN/OUT CATH URINE Performed at [facility] [address] [place], [place] [address]    Special Requests   Final    NONE Performed at [facility], [place] [address] [place], [place] [address]    Culture   Final    NO GROWTH Performed at [facility], [place] [address] [place],  [address]    Report Status 03/10/2021 FINAL  Final    Pressure Injury 02/05/21 Buttocks Left Stage 2 -  Partial thickness loss of dermis presenting as a shallow open injury with a red, pink wound bed without slough. blister ruptured on left buttock, serosanguinous drainage (Active)  02/05/21 1613  Location: Buttocks  Location Orientation: Left  Staging: Stage 2 -  Partial thickness loss of dermis presenting as a shallow open injury with a red, pink wound bed without slough.  Wound Description (Comments): blister ruptured on left buttock, serosanguinous drainage  Present on Admission: No  Oswald Hillock   [facility] If 7PM-7AM, please contact night-coverage at [email or web address], Office  [phone]   03/10/2021, 12:17 PM  LOS: 2 days

## 2021-03-10 NOTE — Progress Notes (Signed)
Patient placed on vest. Metane[place] not tolerated with mask. Patient does not like the mask or being held to receive the treatment.  RT will continue to use the chest vest and suctioning.

## 2021-03-10 NOTE — Progress Notes (Signed)
PHARMACY - PHYSICIAN COMMUNICATION CRITICAL VALUE ALERT - BLOOD CULTURE IDENTIFICATION (BCID)  Brian [name] is an 85 y.o. male who presented to [facility] on 03/08/2021 with a chief complaint N/V, hypoxemic  Assessment:  1 of 3 bottles staph species> suspect contaminant  Name of physician (or Provider) Contacted: X. Blount via secure chat  Current antibiotics: Unasyn for asp PNA  Changes to prescribed antibiotics recommended: rec no abx for this BCID  No results found for this or any previous visit.  Eudelia Bunch, Pharm.D 03/10/2021 9:27 PM

## 2021-03-11 LAB — GLUCOSE, CAPILLARY
Glucose-Capillary: 141 mg/dL — ABNORMAL HIGH (ref 70–99)
Glucose-Capillary: 145 mg/dL — ABNORMAL HIGH (ref 70–99)
Glucose-Capillary: 152 mg/dL — ABNORMAL HIGH (ref 70–99)
Glucose-Capillary: 154 mg/dL — ABNORMAL HIGH (ref 70–99)
Glucose-Capillary: 166 mg/dL — ABNORMAL HIGH (ref 70–99)
Glucose-Capillary: 168 mg/dL — ABNORMAL HIGH (ref 70–99)
Glucose-Capillary: 190 mg/dL — ABNORMAL HIGH (ref 70–99)

## 2021-03-11 NOTE — Progress Notes (Addendum)
[facility]  PROGRESS NOTE  Brian [name] QJJ:[id number] DOB: 11-27-1935 DOA: 03/08/2021 PCP: [name], MD   Brief HPI:   85 year old male with medical history of atrial fibrillation, ICH/stroke, diabetes mellitus type 2, dysphagia presents with acute hypoxemic respiratory failure.  Patient is currently at rehab, has been having intermittent lung congestion.  Apparently patient became nauseous and started vomiting.  After that he was noted to be hypoxemic with room air sats of 84%.  He was started on 4 L oxygen via [place].  EMS was called.  Chest x-[place] showed pneumonia.  Patient started on Rocephin.    Subjective   Patient seen and examined, he is more alert.  Responds to voice.   Assessment/Plan:     Acute hypoxemic respiratory failure -Secondary to aspiration pneumonia -Patient continues to spike fever despite being on antibiotics -He was started on ceftriaxone and Zithromax, ceftriaxone was discontinued and patient started on Unasyn and Zithromax. -Follow blood culture results -Patient is unable to cough up the phlegm, will obtain chest PT every 4 hours; suction as needed -Continue oxygen via nasal cannula, maintain O2 sats more than 92%  Recent hemorrhagic stroke -Patient has PEG tube in place -Nonverbal, does not follow commands.  Responds to voice -Continue tube feeding  Diabetes mellitus type 2 -Continue insulin detemir 10 units subcu nightly -Sliding scale insulin NovoLog -CBG well controlled  Acute kidney injury -Creatinine 1.30; baseline creatinine 1.0 -Was started on IV fluids, IV fluids discontinued for concern for developing pulmonary edema -Creatinine this morning is back to baseline 1.0  Staph bacteremia -1-3 bottles growing Streptococcus -Final result is pending -Likely contamination -We will follow result     Scheduled medications:    free water  100 mL Per Tube Q4H   insulin aspart  0-15 Units Subcutaneous TID WC   insulin aspart  0-5  Units Subcutaneous QHS   insulin detemir  10 Units Subcutaneous QHS         Data Reviewed:   CBG:  Recent Labs  Lab 03/10/21 1138 03/10/21 1649 03/10/21 1952 03/11/21 0738 03/11/21 1148  GLUCAP 153* 137* 130* 154* 145*    SpO2: 99 % O2 Flow Rate (L/min): 3 L/min    Vitals:   03/10/21 1335 03/10/21 1949 03/11/21 0500 03/11/21 0611  BP: (!) 148/69 (!) 150/75  (!) 147/93  Pulse: 77 79  80  Resp: 18 20  20   Temp: 100.3 F (37.9 C) 99 F (37.2 C)  99.2 F (37.3 C)  TempSrc:  Oral  Oral  SpO2: 96% 100%  99%  Weight:   101.7 kg   Height:         Intake/Output Summary (Last 24 hours) at 03/11/2021 1215 Last data filed at 03/11/2021 0610 Gross per 24 hour  Intake 3009.67 ml  Output 775 ml  Net 2234.67 ml    06/23 1901 - 06/25 0700 In: 3009.7  Out: 1025 [Urine:1025]  Filed Weights   03/09/21 0500 03/10/21 0500 03/11/21 0500  Weight: 95.4 kg 99.5 kg 101.7 kg    CBC:  Recent Labs  Lab 03/08/21 0716 03/09/21 0602 03/10/21 0446  WBC 6.8 8.6 8.0  HGB 11.0* 9.4* 9.0*  HCT 38.2* 33.1* 31.7*  PLT 171 153 179  MCV 87.8 89.0 89.8  MCH 25.3* 25.3* 25.5*  MCHC 28.8* 28.4* 28.4*  RDW 16.7* 16.8* 16.9*  LYMPHSABS 0.7  --   --   MONOABS 0.5  --   --   EOSABS 0.0  --   --  BASOSABS 0.0  --   --     Complete metabolic panel:  Recent Labs  Lab 03/08/21 0716 03/08/21 0800 03/08/21 1312 03/09/21 0602 03/09/21 1118 03/10/21 0446  NA  --  139  --  143  --  144  K  --  5.3*  --  4.1  --  4.0  CL  --  106  --  109  --  111  CO2  --  23  --  24  --  25  GLUCOSE  --  190*  --  158*  --  167*  BUN  --  40*  --  43*  --  36*  CREATININE  --  1.37*  --  1.30*  --  1.00  CALCIUM  --  8.9  --  9.1  --  9.3  AST  --  44*  --  28  --  48*  ALT  --  25  --  25  --  46*  ALKPHOS  --  61  --  60  --  72  BILITOT  --  1.6*  --  0.8  --  0.6  ALBUMIN  --  3.0*  --  2.8*  --  3.0*  LATICACIDVEN  --  1.9 2.0*  --   --   --   INR 1.3*  --   --   --   --   --   BNP   --   --   --   --  71.4  --     No results for input(s): LIPASE, AMYLASE in the last 168 hours.  Recent Labs  Lab 03/08/21 0517 03/09/21 1118  BNP  --  71.4  SARSCOV2NAA NEGATIVE  --     ------------------------------------------------------------------------------------------------------------------ No results for input(s): CHOL, HDL, LDLCALC, TRIG, CHOLHDL, LDLDIRECT in the last 72 hours.  Lab Results  Component Value Date   HGBA1C 6.1 (H) 01/28/2021   ------------------------------------------------------------------------------------------------------------------ No results for input(s): TSH, T4TOTAL, T3FREE, THYROIDAB in the last 72 hours.  Invalid input(s): FREET3 ------------------------------------------------------------------------------------------------------------------ No results for input(s): VITAMINB12, FOLATE, FERRITIN, TIBC, IRON, RETICCTPCT in the last 72 hours.  Coagulation profile Recent Labs  Lab 03/08/21 0716  INR 1.3*   No results for input(s): DDIMER in the last 72 hours.  Cardiac Enzymes No results for input(s): CKTOTAL, CKMB, CKMBINDEX, TROPONINI in the last 168 hours.  ------------------------------------------------------------------------------------------------------------------    Component Value Date/Time   BNP 71.4 03/09/2021 1118     Antibiotics: Anti-infectives (From admission, onward)    Start     Dose/Rate Route Frequency Ordered Stop   03/09/21 1915  Ampicillin-Sulbactam (UNASYN) 3 g in sodium chloride 0.9 % 100 mL IVPB        3 g 200 mL/hr over 30 Minutes Intravenous Every 6 hours 03/09/21 1818     03/08/21 0645  amoxicillin (AMOXIL) capsule 1,000 mg  Status:  Discontinued        1,000 mg Oral  Once 03/08/21 0635 03/08/21 0636   03/08/21 0530  cefTRIAXone (ROCEPHIN) 2 g in sodium chloride 0.9 % 100 mL IVPB  Status:  Discontinued        2 g 200 mL/hr over 30 Minutes Intravenous Every 24 hours 03/08/21 0518 03/09/21 1811    03/08/21 0530  azithromycin (ZITHROMAX) 500 mg in sodium chloride 0.9 % 250 mL IVPB        500 mg 250 mL/hr over 60 Minutes Intravenous Every 24 hours 03/08/21 0518     03/08/21  0000  amoxicillin (AMOXIL) 500 MG capsule  Status:  Discontinued        1,000 mg Oral 2 times daily 03/08/21 4854 03/08/21         Radiology Reports  No results found.    DVT prophylaxis: SCDs  Code Status: Full code  Family Communication: No family at bedside   Consultants:   Procedures:     Objective    Physical Examination:  General-appears in no acute distress Neuro-alert, responds to voice, does not follow commands -Did not let me auscultate heart and lungs   Status is: Inpatient  Dispo: The patient is from: Rehab              Anticipated d/c is to: Skilled nursing facility              Anticipated d/c date is: 03/14/2021              Patient currently not stable for discharge  Barrier to discharge-ongoing treatment for aspiration pneumonia  COVID-19 Labs  No results for input(s): DDIMER, FERRITIN, LDH, CRP in the last 72 hours.  Lab Results  Component Value Date   [place] NEGATIVE 03/08/2021   [place] NEGATIVE 02/27/2021   [place] NEGATIVE 01/23/2021    Microbiology  Recent Results (from the past 240 hour(s))  Resp Panel by RT-PCR (Flu A&B, Covid) Nasopharyngeal Swab     Status: None   Collection Time: 03/08/21  5:17 AM   Specimen: Nasopharyngeal Swab; Nasopharyngeal(NP) swabs in vial transport medium  Result Value [place] Status   SARS Coronavirus 2 by RT PCR NEGATIVE NEGATIVE Final    Comment: (NOTE) SARS-CoV-2 target nucleic acids are NOT DETECTED.  The SARS-CoV-2 RNA is generally detectable in upper respiratory specimens during the acute phase of infection. The lowest concentration of SARS-CoV-2 viral copies this assay can detect is 138 copies/mL. A negative result does not preclude SARS-Cov-2 infection and should not be used as the sole basis  for treatment or other patient management decisions. A negative result may occur with  improper specimen collection/handling, submission of specimen other than nasopharyngeal swab, presence of viral mutation(s) within the areas targeted by this assay, and inadequate number of viral copies(<138 copies/mL). A negative result must be combined with clinical observations, patient history, and epidemiological information. The expected result is Negative.  Fact Sheet for Patients:  EntrepreneurPulse.com.au  Fact Sheet for Healthcare Providers:  IncredibleEmployment.be  This test is no t yet approved or cleared by the [place] FDA and  has been authorized for detection and/or diagnosis of SARS-CoV-2 by FDA under an Emergency Use Authorization (EUA). This EUA will remain  in effect (meaning this test can be used) for the duration of the COVID-19 declaration under Section 564(b)(1) of the Act, 21 U.S.C.section 360bbb-3(b)(1), unless the authorization is terminated  or revoked sooner.       Influenza A by PCR NEGATIVE NEGATIVE Final   Influenza B by PCR NEGATIVE NEGATIVE Final    Comment: (NOTE) The Xpert Xpress SARS-CoV-2/FLU/RSV plus assay is intended as an aid in the diagnosis of influenza from Nasopharyngeal swab specimens and should not be used as a sole basis for treatment. Nasal washings and aspirates are unacceptable for Xpert Xpress SARS-CoV-2/FLU/RSV testing.  Fact Sheet for Patients: EntrepreneurPulse.com.au  Fact Sheet for Healthcare Providers: IncredibleEmployment.be  This test is not yet approved or cleared by the [place] FDA and has been authorized for detection and/or diagnosis of SARS-CoV-2 by FDA under an Emergency Use Authorization (EUA).  This EUA will remain in effect (meaning this test can be used) for the duration of the COVID-19 declaration under Section 564(b)(1) of the Act, 21  U.S.C. section 360bbb-3(b)(1), unless the authorization is terminated or revoked.  Performed at [facility], [place] [address] [place], [place] [address]   Blood Culture (routine x 2)     Status: None (Preliminary result)   Collection Time: 03/08/21  5:23 AM   Specimen: BLOOD RIGHT HAND  Result Value [place] Status   Specimen Description   Final    BLOOD RIGHT HAND Performed at [facility] [address], [place], [place] [address]    Special Requests   Final    BOTTLES DRAWN AEROBIC AND ANAEROBIC Blood Culture results may not be optimal due to an inadequate volume of blood received in culture bottles Performed at [facility] [address], [place], [place] [address]    Culture  Setup Time   Final    GRAM POSITIVE COCCI IN CLUSTERS AEROBIC BOTTLE ONLY CRITICAL RESULT CALLED TO, READ BACK BY AND VERIFIED WITH: Colin Rhein [facility] 2120 03/10/21 A BROWNING    Culture   Final    GRAM POSITIVE COCCI IDENTIFICATION TO FOLLOW Performed at [facility], [place] [address], [place], [place] [address]    Report Status PENDING  Incomplete  Blood Culture ID Panel (Reflexed)     Status: Abnormal   Collection Time: 03/08/21  5:23 AM  Result Value Ref [place] Status   Enterococcus faecalis NOT DETECTED NOT DETECTED Final   Enterococcus Faecium NOT DETECTED NOT DETECTED Final   Listeria monocytogenes NOT DETECTED NOT DETECTED Final   Staphylococcus species DETECTED (A) NOT DETECTED Final    Comment: CRITICAL RESULT CALLED TO, READ BACK BY AND VERIFIED WITH: M BELL PHARMD 2120 03/10/21 A BROWNING    Staphylococcus aureus (BCID) NOT DETECTED NOT DETECTED Final   Staphylococcus epidermidis NOT DETECTED NOT DETECTED Final   Staphylococcus lugdunensis NOT DETECTED NOT DETECTED Final   Streptococcus species NOT DETECTED NOT DETECTED Final   Streptococcus agalactiae NOT DETECTED NOT DETECTED Final   Streptococcus pneumoniae NOT DETECTED NOT  DETECTED Final   Streptococcus pyogenes NOT DETECTED NOT DETECTED Final   A.calcoaceticus-baumannii NOT DETECTED NOT DETECTED Final   Bacteroides fragilis NOT DETECTED NOT DETECTED Final   Enterobacterales NOT DETECTED NOT DETECTED Final   Enterobacter cloacae complex NOT DETECTED NOT DETECTED Final   Escherichia coli NOT DETECTED NOT DETECTED Final   Klebsiella aerogenes NOT DETECTED NOT DETECTED Final   Klebsiella oxytoca NOT DETECTED NOT DETECTED Final   Klebsiella pneumoniae NOT DETECTED NOT DETECTED Final   Proteus species NOT DETECTED NOT DETECTED Final   Salmonella species NOT DETECTED NOT DETECTED Final   Serratia marcescens NOT DETECTED NOT DETECTED Final   Haemophilus influenzae NOT DETECTED NOT DETECTED Final   Neisseria meningitidis NOT DETECTED NOT DETECTED Final   Pseudomonas aeruginosa NOT DETECTED NOT DETECTED Final   Stenotrophomonas maltophilia NOT DETECTED NOT DETECTED Final   Candida albicans NOT DETECTED NOT DETECTED Final   Candida auris NOT DETECTED NOT DETECTED Final   Candida glabrata NOT DETECTED NOT DETECTED Final   Candida krusei NOT DETECTED NOT DETECTED Final   Candida parapsilosis NOT DETECTED NOT DETECTED Final   Candida tropicalis NOT DETECTED NOT DETECTED Final   Cryptococcus neoformans/gattii NOT DETECTED NOT DETECTED Final    Comment: Performed at [facility], [address], [place],  [address]  Blood Culture (  routine x 2)     Status: None (Preliminary result)   Collection Time: 03/08/21  1:12 PM   Specimen: BLOOD LEFT HAND  Result Value [place] Status   Specimen Description   Final    BLOOD LEFT HAND Performed at [facility] [address], [place], [place] [address]    Special Requests   Final    BOTTLES DRAWN AEROBIC ONLY Blood Culture adequate volume Performed at [facility] [address] [place], [place] [address]    Culture   Final    NO GROWTH 3 DAYS Performed at [facility], [place] [address] [place], [place] [address]    Report Status PENDING  Incomplete  Urine culture     Status: None   Collection Time: 03/08/21  7:33 PM   Specimen: In/Out Cath Urine  Result Value [place] Status   Specimen Description   Final    IN/OUT CATH URINE Performed at [facility] [address], [place], [place] [address]    Special Requests   Final    NONE Performed at [facility], [place] [address] [place], [place] [address]    Culture   Final    NO GROWTH Performed at [facility], [place] [address], [place], [place] [address]    Report Status 03/10/2021 FINAL  Final    Pressure Injury 02/05/21 Buttocks Left Stage 2 -  Partial thickness loss of dermis presenting as a shallow open injury with a red, pink wound bed without slough. blister ruptured on left buttock, serosanguinous drainage (Active)  02/05/21 1613  Location: Buttocks  Location Orientation: Left  Staging: Stage 2 -  Partial thickness loss of dermis presenting as a shallow open injury with a red, pink wound bed without slough.  Wound Description (Comments): blister ruptured on left buttock, serosanguinous drainage  Present on Admission: No          [place]   [facility] If 7PM-7AM, please contact night-coverage at [email or web address], Office  [phone]   03/11/2021, 12:15 PM  LOS: 3 days

## 2021-03-12 LAB — CULTURE, BLOOD (ROUTINE X 2)

## 2021-03-12 LAB — GLUCOSE, CAPILLARY
Glucose-Capillary: 202 mg/dL — ABNORMAL HIGH (ref 70–99)
Glucose-Capillary: 220 mg/dL — ABNORMAL HIGH (ref 70–99)
Glucose-Capillary: 189 mg/dL — ABNORMAL HIGH (ref 70–99)
Glucose-Capillary: 174 mg/dL — ABNORMAL HIGH (ref 70–99)
Glucose-Capillary: 154 mg/dL — ABNORMAL HIGH (ref 70–99)

## 2021-03-12 MED ORDER — INSULIN DETEMIR 100 UNIT/ML ~~LOC~~ SOLN
10.0000 [IU] | Freq: Two times a day (BID) | SUBCUTANEOUS | Status: DC
  Administered 2021-03-12 – 2021-03-15 (×7): 10 [IU] via SUBCUTANEOUS
  Filled 2021-03-12 (×8): qty 0.1

## 2021-03-12 NOTE — Progress Notes (Signed)
[facility]  PROGRESS NOTE  Brian [name] TKZ:[id number] DOB: 1935/10/15 DOA: 03/08/2021 PCP: [name], MD   Brief HPI:   85 year old male with medical history of atrial fibrillation, ICH/stroke, diabetes mellitus type 2, dysphagia presents with acute hypoxemic respiratory failure.  Patient is currently at rehab, has been having intermittent lung congestion.  Apparently patient became nauseous and started vomiting.  After that he was noted to be hypoxemic with room air sats of 84%.  He was started on 4 L oxygen via [place].  EMS was called.  Chest x-[place] showed pneumonia.  Patient started on Rocephin.    Subjective   Patient seen and examined, somnolent after chest PT.   Assessment/Plan:     Acute hypoxemic respiratory failure -Secondary to aspiration pneumonia; significantly improved -Patient continues to spike fever despite being on antibiotics -He was started on ceftriaxone and Zithromax, ceftriaxone was discontinued and patient started on Unasyn and Zithromax. -Blood cultures 1 out of 2 sets growing coagulase-negative staph aureus.  Likely contamination -Patient was not able to cough up phlegm, started on chest PT every 4 hours with suction as needed.  Patient has significantly improved.   -Continue oxygen via nasal cannula, maintain O2 sats more than 92%  Recent hemorrhagic stroke -Patient has PEG tube in place -Nonverbal, does not follow commands.  Responds to voice -Continue tube feeding  Diabetes mellitus type 2 -Continue insulin detemir 10 units subcu nightly -Sliding scale insulin NovoLog -CBG well controlled  Acute kidney injury -Creatinine 1.30; baseline creatinine 1.0 -Was started on IV fluids, IV fluids discontinued for concern for developing pulmonary edema -Creatinine this morning is back to baseline 1.0       Scheduled medications:    free water  100 mL Per Tube Q4H   insulin aspart  0-15 Units Subcutaneous TID WC   insulin aspart  0-5 Units  Subcutaneous QHS   insulin detemir  10 Units Subcutaneous BID         Data Reviewed:   CBG:  Recent Labs  Lab 03/11/21 2120 03/11/21 2340 03/12/21 0324 03/12/21 0734 03/12/21 1147  GLUCAP 168* 190* 202* 220* 189*    SpO2: 95 % O2 Flow Rate (L/min): 3 L/min    Vitals:   03/11/21 1959 03/12/21 0323 03/12/21 0500 03/12/21 0700  BP: (!) 180/80 (!) 154/92    Pulse: 86 86    Resp: (!) 24 (!) 21    Temp: 99.5 F (37.5 C) 100.2 F (37.9 C)    TempSrc: Oral Oral    SpO2: 100% 100%  95%  Weight:   102 kg   Height:         Intake/Output Summary (Last 24 hours) at 03/12/2021 1232 Last data filed at 03/12/2021 0412 Gross per 24 hour  Intake 856.22 ml  Output 1400 ml  Net -543.78 ml    06/24 1901 - 06/26 0700 In: 3865.9  Out: 1850 [Urine:1850]  Filed Weights   03/10/21 0500 03/11/21 0500 03/12/21 0500  Weight: 99.5 kg 101.7 kg 102 kg    CBC:  Recent Labs  Lab 03/08/21 0716 03/09/21 0602 03/10/21 0446  WBC 6.8 8.6 8.0  HGB 11.0* 9.4* 9.0*  HCT 38.2* 33.1* 31.7*  PLT 171 153 179  MCV 87.8 89.0 89.8  MCH 25.3* 25.3* 25.5*  MCHC 28.8* 28.4* 28.4*  RDW 16.7* 16.8* 16.9*  LYMPHSABS 0.7  --   --   MONOABS 0.5  --   --   EOSABS 0.0  --   --  BASOSABS 0.0  --   --     Complete metabolic panel:  Recent Labs  Lab 03/08/21 0716 03/08/21 0800 03/08/21 1312 03/09/21 0602 03/09/21 1118 03/10/21 0446  NA  --  139  --  143  --  144  K  --  5.3*  --  4.1  --  4.0  CL  --  106  --  109  --  111  CO2  --  23  --  24  --  25  GLUCOSE  --  190*  --  158*  --  167*  BUN  --  40*  --  43*  --  36*  CREATININE  --  1.37*  --  1.30*  --  1.00  CALCIUM  --  8.9  --  9.1  --  9.3  AST  --  44*  --  28  --  48*  ALT  --  25  --  25  --  46*  ALKPHOS  --  61  --  60  --  72  BILITOT  --  1.6*  --  0.8  --  0.6  ALBUMIN  --  3.0*  --  2.8*  --  3.0*  LATICACIDVEN  --  1.9 2.0*  --   --   --   INR 1.3*  --   --   --   --   --   BNP  --   --   --   --  71.4  --      No results for input(s): LIPASE, AMYLASE in the last 168 hours.  Recent Labs  Lab 03/08/21 0517 03/09/21 1118  BNP  --  71.4  SARSCOV2NAA NEGATIVE  --     ------------------------------------------------------------------------------------------------------------------ No results for input(s): CHOL, HDL, LDLCALC, TRIG, CHOLHDL, LDLDIRECT in the last 72 hours.  Lab Results  Component Value Date   HGBA1C 6.1 (H) 01/28/2021   ------------------------------------------------------------------------------------------------------------------ No results for input(s): TSH, T4TOTAL, T3FREE, THYROIDAB in the last 72 hours.  Invalid input(s): FREET3 ------------------------------------------------------------------------------------------------------------------ No results for input(s): VITAMINB12, FOLATE, FERRITIN, TIBC, IRON, RETICCTPCT in the last 72 hours.  Coagulation profile Recent Labs  Lab 03/08/21 0716  INR 1.3*   No results for input(s): DDIMER in the last 72 hours.  Cardiac Enzymes No results for input(s): CKTOTAL, CKMB, CKMBINDEX, TROPONINI in the last 168 hours.  ------------------------------------------------------------------------------------------------------------------    Component Value Date/Time   BNP 71.4 03/09/2021 1118     Antibiotics: Anti-infectives (From admission, onward)    Start     Dose/Rate Route Frequency Ordered Stop   03/09/21 1915  Ampicillin-Sulbactam (UNASYN) 3 g in sodium chloride 0.9 % 100 mL IVPB        3 g 200 mL/hr over 30 Minutes Intravenous Every 6 hours 03/09/21 1818     03/08/21 0645  amoxicillin (AMOXIL) capsule 1,000 mg  Status:  Discontinued        1,000 mg Oral  Once 03/08/21 0635 03/08/21 0636   03/08/21 0530  cefTRIAXone (ROCEPHIN) 2 g in sodium chloride 0.9 % 100 mL IVPB  Status:  Discontinued        2 g 200 mL/hr over 30 Minutes Intravenous Every 24 hours 03/08/21 0518 03/09/21 1811   03/08/21 0530  azithromycin  (ZITHROMAX) 500 mg in sodium chloride 0.9 % 250 mL IVPB        500 mg 250 mL/hr over 60 Minutes Intravenous Every 24 hours 03/08/21 0518     03/08/21  0000  amoxicillin (AMOXIL) 500 MG capsule  Status:  Discontinued        1,000 mg Oral 2 times daily 03/08/21 8850 03/08/21         Radiology Reports  No results found.    DVT prophylaxis: SCDs  Code Status: Full code  Family Communication: No family at bedside   Consultants:   Procedures:     Objective    Physical Examination:  General-appears in no acute distress Heart-S1-S2, regular, no murmur auscultated Lungs-clear to auscultation bilaterally, no wheezing or crackles auscultated Abdomen-soft, nontender, no organomegaly Extremities-no edema in the lower extremities Neuro-somnolent but arousable   Status is: Inpatient  Dispo: The patient is from: Rehab              Anticipated d/c is to: Skilled nursing facility              Anticipated d/c date is: 03/13/2021              Patient currently not stable for discharge  Barrier to discharge-ongoing treatment for aspiration pneumonia  COVID-19 Labs  No results for input(s): DDIMER, FERRITIN, LDH, CRP in the last 72 hours.  Lab Results  Component Value Date   [place] NEGATIVE 03/08/2021   [place] NEGATIVE 02/27/2021   [place] NEGATIVE 01/23/2021    Microbiology  Recent Results (from the past 240 hour(s))  Resp Panel by RT-PCR (Flu A&B, Covid) Nasopharyngeal Swab     Status: None   Collection Time: 03/08/21  5:17 AM   Specimen: Nasopharyngeal Swab; Nasopharyngeal(NP) swabs in vial transport medium  Result Value Ref Range Status   SARS Coronavirus 2 by RT PCR NEGATIVE NEGATIVE Final    Comment: (NOTE) SARS-CoV-2 target nucleic acids are NOT DETECTED.  The SARS-CoV-2 RNA is generally detectable in upper respiratory specimens during the acute phase of infection. The lowest concentration of SARS-CoV-2 viral copies this assay can detect  is 138 copies/mL. A negative result does not preclude SARS-Cov-2 infection and should not be used as the sole basis for treatment or other patient management decisions. A negative result may occur with  improper specimen collection/handling, submission of specimen other than nasopharyngeal swab, presence of viral mutation(s) within the areas targeted by this assay, and inadequate number of viral copies(<138 copies/mL). A negative result must be combined with clinical observations, patient history, and epidemiological information. The expected result is Negative.  Fact Sheet for Patients:  EntrepreneurPulse.com.au  Fact Sheet for Healthcare Providers:  IncredibleEmployment.be  This test is no t yet approved or cleared by the [place] FDA and  has been authorized for detection and/or diagnosis of SARS-CoV-2 by FDA under an Emergency Use Authorization (EUA). This EUA will remain  in effect (meaning this test can be used) for the duration of the COVID-19 declaration under Section 564(b)(1) of the Act, 21 U.S.C.section 360bbb-3(b)(1), unless the authorization is terminated  or revoked sooner.       Influenza A by PCR NEGATIVE NEGATIVE Final   Influenza B by PCR NEGATIVE NEGATIVE Final    Comment: (NOTE) The Xpert Xpress SARS-CoV-2/FLU/RSV plus assay is intended as an aid in the diagnosis of influenza from Nasopharyngeal swab specimens and should not be used as a sole basis for treatment. Nasal washings and aspirates are unacceptable for Xpert Xpress SARS-CoV-2/FLU/RSV testing.  Fact Sheet for Patients: EntrepreneurPulse.com.au  Fact Sheet for Healthcare Providers: IncredibleEmployment.be  This test is not yet approved or cleared by the [place] FDA and has been authorized for detection and/or  diagnosis of SARS-CoV-2 by FDA under an Emergency Use Authorization (EUA). This EUA will remain in effect  (meaning this test can be used) for the duration of the COVID-19 declaration under Section 564(b)(1) of the Act, 21 U.S.C. section 360bbb-3(b)(1), unless the authorization is terminated or revoked.  Performed at [facility], [place] [address], [place], [place] [address]   Blood Culture (routine x 2)     Status: Abnormal   Collection Time: 03/08/21  5:23 AM   Specimen: BLOOD RIGHT HAND  Result Value Ref Range Status   Specimen Description   Final    BLOOD RIGHT HAND Performed at [facility] [address] [place], [place] [address]    Special Requests   Final    BOTTLES DRAWN AEROBIC AND ANAEROBIC Blood Culture results may not be optimal due to an inadequate volume of blood received in culture bottles Performed at [facility] [address], [place], [place] [address]    Culture  Setup Time   Final    GRAM POSITIVE COCCI IN CLUSTERS AEROBIC BOTTLE ONLY CRITICAL RESULT CALLED TO, READ BACK BY AND VERIFIED WITH: Colin Rhein [facility] 2120 03/10/21 A BROWNING    Culture (A)  Final    COAGULASE NEGATIVE STAPHYLOCOCCUS THE SIGNIFICANCE OF ISOLATING THIS ORGANISM FROM A SINGLE SET OF BLOOD CULTURES WHEN MULTIPLE SETS ARE DRAWN IS UNCERTAIN. PLEASE NOTIFY THE MICROBIOLOGY DEPARTMENT WITHIN ONE WEEK IF SPECIATION AND SENSITIVITIES ARE REQUIRED. Performed at [facility], [place] [address] [place], [place] [address]    Report Status 03/12/2021 FINAL  Final  Blood Culture ID Panel (Reflexed)     Status: Abnormal   Collection Time: 03/08/21  5:23 AM  Result Value Ref Range Status   Enterococcus faecalis NOT DETECTED NOT DETECTED Final   Enterococcus Faecium NOT DETECTED NOT DETECTED Final   Listeria monocytogenes NOT DETECTED NOT DETECTED Final   Staphylococcus species DETECTED (A) NOT DETECTED Final    Comment: CRITICAL RESULT CALLED TO, READ BACK BY AND VERIFIED WITH: M BELL PHARMD 2120 03/10/21 A BROWNING    Staphylococcus aureus  (BCID) NOT DETECTED NOT DETECTED Final   Staphylococcus epidermidis NOT DETECTED NOT DETECTED Final   Staphylococcus lugdunensis NOT DETECTED NOT DETECTED Final   Streptococcus species NOT DETECTED NOT DETECTED Final   Streptococcus agalactiae NOT DETECTED NOT DETECTED Final   Streptococcus pneumoniae NOT DETECTED NOT DETECTED Final   Streptococcus pyogenes NOT DETECTED NOT DETECTED Final   A.calcoaceticus-baumannii NOT DETECTED NOT DETECTED Final   Bacteroides fragilis NOT DETECTED NOT DETECTED Final   Enterobacterales NOT DETECTED NOT DETECTED Final   Enterobacter cloacae complex NOT DETECTED NOT DETECTED Final   Escherichia coli NOT DETECTED NOT DETECTED Final   Klebsiella aerogenes NOT DETECTED NOT DETECTED Final   Klebsiella oxytoca NOT DETECTED NOT DETECTED Final   Klebsiella pneumoniae NOT DETECTED NOT DETECTED Final   Proteus species NOT DETECTED NOT DETECTED Final   Salmonella species NOT DETECTED NOT DETECTED Final   Serratia marcescens NOT DETECTED NOT DETECTED Final   Haemophilus influenzae NOT DETECTED NOT DETECTED Final   Neisseria meningitidis NOT DETECTED NOT DETECTED Final   Pseudomonas aeruginosa NOT DETECTED NOT DETECTED Final   Stenotrophomonas maltophilia NOT DETECTED NOT DETECTED Final   Candida albicans NOT DETECTED NOT DETECTED Final   Candida auris NOT DETECTED NOT DETECTED Final   Candida glabrata NOT DETECTED NOT DETECTED Final   Candida krusei NOT DETECTED NOT DETECTED Final   Candida parapsilosis NOT DETECTED  NOT DETECTED Final   Candida tropicalis NOT DETECTED NOT DETECTED Final   Cryptococcus neoformans/gattii NOT DETECTED NOT DETECTED Final    Comment: Performed at [facility], [address] [place], [place] [address]  Blood Culture (routine x 2)     Status: None (Preliminary result)   Collection Time: 03/08/21  1:12 PM   Specimen: BLOOD LEFT HAND  Result Value Ref Range Status   Specimen Description   Final    BLOOD LEFT HAND Performed at  [facility] [address], [place], [place] [address]    Special Requests   Final    BOTTLES DRAWN AEROBIC ONLY Blood Culture adequate volume Performed at [facility] [address] [place], [place] [address]    Culture   Final    NO GROWTH 4 DAYS Performed at [facility], [place] [address] [place], [place] [address]    Report Status PENDING  Incomplete  Urine culture     Status: None   Collection Time: 03/08/21  7:33 PM   Specimen: In/Out Cath Urine  Result Value Ref Range Status   Specimen Description   Final    IN/OUT CATH URINE Performed at [facility] [address] [place], [place] [address]    Special Requests   Final    NONE Performed at [facility], [place] [address] [place], [place] [address]    Culture   Final    NO GROWTH Performed at [facility], [place] [address] [place], [place] [address]    Report Status 03/10/2021 FINAL  Final    Pressure Injury 02/05/21 Buttocks Left Stage 2 -  Partial thickness loss of dermis presenting as a shallow open injury with a red, pink wound bed without slough. blister ruptured on left buttock, serosanguinous drainage (Active)  02/05/21 1613  Location: Buttocks  Location Orientation: Left  Staging: Stage 2 -  Partial thickness loss of dermis presenting as a shallow open injury with a red, pink wound bed without slough.  Wound Description (Comments): blister ruptured on left buttock, serosanguinous drainage  Present on Admission: No          [place]   [facility] If 7PM-7AM, please contact night-coverage at [email or web address], Office  [phone]   03/12/2021, 12:32 PM  LOS: 4 days

## 2021-03-13 DIAGNOSIS — R0600 Dyspnea, unspecified: Secondary | ICD-10-CM

## 2021-03-13 LAB — GLUCOSE, CAPILLARY
Glucose-Capillary: 175 mg/dL — ABNORMAL HIGH (ref 70–99)
Glucose-Capillary: 162 mg/dL — ABNORMAL HIGH (ref 70–99)
Glucose-Capillary: 144 mg/dL — ABNORMAL HIGH (ref 70–99)
Glucose-Capillary: 167 mg/dL — ABNORMAL HIGH (ref 70–99)

## 2021-03-13 LAB — CULTURE, BLOOD (ROUTINE X 2)
Culture: NO GROWTH
Special Requests: ADEQUATE

## 2021-03-13 LAB — MRSA PCR SCREENING: MRSA by PCR: NEGATIVE

## 2021-03-13 MED ORDER — FUROSEMIDE 10 MG/ML IJ SOLN
20.0000 mg | Freq: Two times a day (BID) | INTRAMUSCULAR | Status: DC
  Administered 2021-03-13 – 2021-03-14 (×2): 20 mg via INTRAVENOUS
  Filled 2021-03-13 (×2): qty 2

## 2021-03-13 NOTE — NC FL2 (Signed)
[place] LEVEL OF CARE SCREENING TOOL     IDENTIFICATION  Patient Name: Brian [name] Birthdate: 05/30/1936 Sex: male Admission Date (Current Location): 03/08/2021  [facility] [place] and [place] Number:  Herbalist and Address:  [facility],  [place], [place]      Provider Number: [id number]  Attending Physician Name and Address:  Oswald Hillock, MD  Relative Name and Phone Number:  [name], son, [phone]    Current Level of Care: Hospital Recommended Level of Care: [place] Prior Approval Number:    Date Approved/Denied:   PASRR Number: [id number]  Discharge Plan: SNF    Current Diagnoses: Patient Active Problem List   Diagnosis Date Noted   Aspiration pneumonia ([place]) 03/08/2021   Pressure injury of skin 02/06/2021   SVT (supraventricular tachycardia) ([place])    ICH (intracerebral hemorrhage) ([place]) 01/23/2021   Skin lesion of right leg 11/14/2020   Uncontrolled type 2 diabetes mellitus with circulatory disorder causing erectile dysfunction ([place]) 11/14/2020   Bee sting reaction 07/10/2020   Vitamin D deficiency 07/10/2020   Right lumbar radiculopathy 10/20/2019   Insomnia 01/31/2019   Acute ischemic left ACA stroke ([place]) 06/23/2018   History of CVA (cerebrovascular accident)    PAF (paroxysmal atrial fibrillation) ([place])    Diastolic dysfunction    Acute CVA (cerebrovascular accident) ([place]) 04/09/2018   Cerebral embolism with cerebral infarction 04/02/2018   Right leg pain 12/04/2017   Grief 12/04/2017   Poorly controlled type 2 diabetes mellitus with circulatory disorder ([place]) 10/02/2016   Allergic conjunctivitis 01/12/2016   Abdominal pain, other specified site 06/23/2012   Erectile dysfunction 12/13/2011   Ejection fraction    Fatigue 06/14/2011   Encounter for well adult exam with abnormal findings 06/10/2011   Nephrolithiasis 12/07/2010   Hypertensive crisis 12/07/2010   SKIN LESION 03/01/2008    ALLERGIC RHINITIS 10/13/2007   Mixed hyperlipidemia 03/31/2007   Overweight (BMI 25.0-29.9) 03/31/2007   ANEMIA, CHRONIC DISEASE NEC 03/31/2007   POLYP, ANAL AND RECTAL 03/31/2007   BENIGN PROSTATIC HYPERTROPHY 03/31/2007   [place] DISEASE, LUMBAR 03/31/2007    Orientation RESPIRATION BLADDER Height & Weight     Self  O2 Incontinent, External catheter Weight: 226 lb 10.1 oz (102.8 kg) Height:  6' (182.9 cm)  BEHAVIORAL SYMPTOMS/MOOD NEUROLOGICAL BOWEL NUTRITION STATUS      Incontinent Feeding tube  AMBULATORY STATUS COMMUNICATION OF NEEDS Skin   Extensive Assist Non-Verbally PU Stage and Appropriate Care   PU Stage 2 Dressing: No Dressing                   Personal Care Assistance Level of Assistance  Bathing, Feeding, Dressing Bathing Assistance: Maximum assistance Feeding assistance: Limited assistance Dressing Assistance: Maximum assistance     Functional Limitations Info  Speech     Speech Info: Impaired    SPECIAL CARE FACTORS FREQUENCY  PT (By licensed PT), OT (By licensed OT), Speech therapy     PT Frequency: 5x/wk OT Frequency: 5x/wk     Speech Therapy Frequency: 5x/wk      Contractures Contractures Info: Not present    Additional Factors Info  Code Status, Allergies, Insulin Sliding Scale Code Status Info: Full Allergies Info: NKDA   Insulin Sliding Scale Info: see MAR       Current Medications (03/13/2021):  This is the current hospital active medication list Current Facility-Administered Medications  Medication Dose Route Frequency Provider Last Rate Last Admin   acetaminophen (TYLENOL) tablet  650 mg  650 mg Per Tube Q6H PRN Marylyn Ishihara, Tyrone A, DO   650 mg at 03/09/21 4270   Or   acetaminophen (TYLENOL) suppository 650 mg  650 mg Rectal Q6H PRN Marylyn Ishihara, Tyrone A, DO   650 mg at 03/09/21 1426   albuterol (PROVENTIL) (2.5 MG/3ML) 0.083% nebulizer solution 2.5 mg  2.5 mg Nebulization Q6H PRN Marylyn Ishihara, Tyrone A, DO       Ampicillin-Sulbactam (UNASYN) 3 g  in sodium chloride 0.9 % 100 mL IVPB  3 g Intravenous Q6H Bell, Michelle T, RPH 200 mL/hr at 03/13/21 1226 3 g at 03/13/21 1226   azithromycin (ZITHROMAX) 500 mg in sodium chloride 0.9 % 250 mL IVPB  500 mg Intravenous Q24H Kyle, Tyrone A, DO 250 mL/hr at 03/13/21 0549 500 mg at 03/13/21 0549   feeding supplement (GLUCERNA 1.5 CAL) liquid 1,000 mL  1,000 mL Per Tube Continuous Marylyn Ishihara, Tyrone A, DO 60 mL/hr at 03/12/21 2018 1,000 mL at 03/12/21 2018   free water 100 mL  100 mL Per Tube Q4H Kyle, Tyrone A, DO   100 mL at 03/13/21 1217   guaiFENesin-dextromethorphan (ROBITUSSIN DM) 100-10 MG/5ML syrup 5 mL  5 mL Per Tube Q4H PRN Marylyn Ishihara, Tyrone A, DO   5 mL at 03/11/21 2036   insulin aspart (novoLOG) injection 0-15 Units  0-15 Units Subcutaneous TID WC Kyle, Tyrone A, DO   3 Units at 03/13/21 1226   insulin aspart (novoLOG) injection 0-5 Units  0-5 Units Subcutaneous QHS Kyle, Tyrone A, DO       insulin detemir (LEVEMIR) injection 10 Units  10 Units Subcutaneous BID Oswald Hillock, MD   10 Units at 03/13/21 1108   ondansetron (ZOFRAN) tablet 4 mg  4 mg Per Tube Q6H PRN Marylyn Ishihara, Tyrone A, DO       Or   ondansetron (ZOFRAN) injection 4 mg  4 mg Intravenous Q6H PRN Marylyn Ishihara, Tyrone A, DO   4 mg at 03/08/21 1757   sodium chloride 0.9 % nebulizer solution 3 mL  3 mL Nebulization QID PRN Oswald Hillock, MD         Discharge Medications: Please see discharge summary for a list of discharge medications.  Relevant Imaging Results:  Relevant Lab Results:   Additional Information SS#: [id number]. [place] COVID-19 Vaccine 06/27/2020 , 11/09/2019 , 10/19/2019  Lennart Pall, LCSW

## 2021-03-13 NOTE — Progress Notes (Signed)
RT unavaliable for CPT

## 2021-03-13 NOTE — Progress Notes (Signed)
[facility]  PROGRESS NOTE  Brian [name] HDQ:[id number] DOB: 1936/04/28 DOA: 03/08/2021 PCP: [name], MD   Brief HPI:   85 year old male with medical history of atrial fibrillation, ICH/stroke, diabetes mellitus type 2, dysphagia presents with acute hypoxemic respiratory failure.  Patient is currently at rehab, has been having intermittent lung congestion.  Apparently patient became nauseous and started vomiting.  After that he was noted to be hypoxemic with room air sats of 84%.  He was started on 4 L oxygen via [place].  EMS was called.  Chest x-Jadd showed pneumonia.  Patient started on Rocephin.    Subjective   Patient seen and examined, appears comfortable.  Still requiring 2 to 3 L/min of oxygen via nasal cannula.  +3 L volume overload   Assessment/Plan:     Acute hypoxemic respiratory failure -Secondary to aspiration pneumonia; significantly improved, though still requiring oxygen.  -Patient continues to spike fever despite being on antibiotics; now afebrile -He was started on ceftriaxone and Zithromax, ceftriaxone was discontinued and patient started on Unasyn and Zithromax.  Today is day #6 of antibiotics.  Will discontinue Zithromax, continue Unasyn for 1 more day to complete 7 days of treatment. -Blood cultures 1 out of 2 sets growing coagulase-negative staph aureus.  Likely contamination -Patient was not able to cough up phlegm, started on chest PT every 4 hours with suction as needed.   -He has developed volume overload of 3 L.  Start Lasix 20 mg IV every 12 hours -Follow intake and output  Recent hemorrhagic stroke -Patient has PEG tube in place -Nonverbal, does not follow commands.  Responds to voice -Continue tube feeding  Diabetes mellitus type 2 -Continue insulin detemir 10 units subcu nightly -Sliding scale insulin NovoLog -CBG well controlled  Acute kidney injury -Creatinine 1.30; baseline creatinine 1.0 -Was started on IV fluids, IV fluids  discontinued for concern for developing pulmonary edema -Creatinine this morning is back to baseline 1.0       Scheduled medications:    free water  100 mL Per Tube Q4H   furosemide  20 mg Intravenous Q12H   insulin aspart  0-15 Units Subcutaneous TID WC   insulin aspart  0-5 Units Subcutaneous QHS   insulin detemir  10 Units Subcutaneous BID         Data Reviewed:   CBG:  Recent Labs  Lab 03/12/21 1147 03/12/21 1646 03/12/21 2027 03/13/21 0748 03/13/21 1135  GLUCAP 189* 174* 154* 175* 162*    SpO2: 95 % O2 Flow Rate (L/min): 3 L/min    Vitals:   03/12/21 1452 03/12/21 2026 03/13/21 0457 03/13/21 0500  BP: (!) 156/71 (!) 151/71 (!) 168/81   Pulse: 78 82 66   Resp: 18 20 20    Temp: 100 F (37.8 C) 100.3 F (37.9 C) 98.5 F (36.9 C)   TempSrc:      SpO2: 99% 95% 95%   Weight:    102.8 kg  Height:         Intake/Output Summary (Last 24 hours) at 03/13/2021 1350 Last data filed at 03/13/2021 1213 Gross per 24 hour  Intake 1200 ml  Output 500 ml  Net 700 ml    06/25 1901 - 06/27 0700 In: 1200  Out: 2300 [Urine:2300]  Filed Weights   03/11/21 0500 03/12/21 0500 03/13/21 0500  Weight: 101.7 kg 102 kg 102.8 kg    CBC:  Recent Labs  Lab 03/08/21 0716 03/09/21 0602 03/10/21 0446  WBC 6.8 8.6 8.0  HGB 11.0* 9.4* 9.0*  HCT 38.2* 33.1* 31.7*  PLT 171 153 179  MCV 87.8 89.0 89.8  MCH 25.3* 25.3* 25.5*  MCHC 28.8* 28.4* 28.4*  RDW 16.7* 16.8* 16.9*  LYMPHSABS 0.7  --   --   MONOABS 0.5  --   --   EOSABS 0.0  --   --   BASOSABS 0.0  --   --     Complete metabolic panel:  Recent Labs  Lab 03/08/21 0716 03/08/21 0800 03/08/21 1312 03/09/21 0602 03/09/21 1118 03/10/21 0446  NA  --  139  --  143  --  144  K  --  5.3*  --  4.1  --  4.0  CL  --  106  --  109  --  111  CO2  --  23  --  24  --  25  GLUCOSE  --  190*  --  158*  --  167*  BUN  --  40*  --  43*  --  36*  CREATININE  --  1.37*  --  1.30*  --  1.00  CALCIUM  --  8.9  --   9.1  --  9.3  AST  --  44*  --  28  --  48*  ALT  --  25  --  25  --  46*  ALKPHOS  --  61  --  60  --  72  BILITOT  --  1.6*  --  0.8  --  0.6  ALBUMIN  --  3.0*  --  2.8*  --  3.0*  LATICACIDVEN  --  1.9 2.0*  --   --   --   INR 1.3*  --   --   --   --   --   BNP  --   --   --   --  71.4  --     No results for input(s): LIPASE, AMYLASE in the last 168 hours.  Recent Labs  Lab 03/08/21 0517 03/09/21 1118  BNP  --  71.4  SARSCOV2NAA NEGATIVE  --     ------------------------------------------------------------------------------------------------------------------ No results for input(s): CHOL, HDL, LDLCALC, TRIG, CHOLHDL, LDLDIRECT in the last 72 hours.  Lab Results  Component Value Date   HGBA1C 6.1 (H) 01/28/2021   ------------------------------------------------------------------------------------------------------------------ No results for input(s): TSH, T4TOTAL, T3FREE, THYROIDAB in the last 72 hours.  Invalid input(s): FREET3 ------------------------------------------------------------------------------------------------------------------ No results for input(s): VITAMINB12, FOLATE, FERRITIN, TIBC, IRON, RETICCTPCT in the last 72 hours.  Coagulation profile Recent Labs  Lab 03/08/21 0716  INR 1.3*   No results for input(s): DDIMER in the last 72 hours.  Cardiac Enzymes No results for input(s): CKTOTAL, CKMB, CKMBINDEX, TROPONINI in the last 168 hours.  ------------------------------------------------------------------------------------------------------------------    Component Value Date/Time   BNP 71.4 03/09/2021 1118     Antibiotics: Anti-infectives (From admission, onward)    Start     Dose/Rate Route Frequency Ordered Stop   03/09/21 1915  Ampicillin-Sulbactam (UNASYN) 3 g in sodium chloride 0.9 % 100 mL IVPB        3 g 200 mL/hr over 30 Minutes Intravenous Every 6 hours 03/09/21 1818     03/08/21 0645  amoxicillin (AMOXIL) capsule 1,000 mg   Status:  Discontinued        1,000 mg Oral  Once 03/08/21 0635 03/08/21 0636   03/08/21 0530  cefTRIAXone (ROCEPHIN) 2 g in sodium chloride 0.9 % 100 mL IVPB  Status:  Discontinued  2 g 200 mL/hr over 30 Minutes Intravenous Every 24 hours 03/08/21 0518 03/09/21 1811   03/08/21 0530  azithromycin (ZITHROMAX) 500 mg in sodium chloride 0.9 % 250 mL IVPB        500 mg 250 mL/hr over 60 Minutes Intravenous Every 24 hours 03/08/21 0518     03/08/21 0000  amoxicillin (AMOXIL) 500 MG capsule  Status:  Discontinued        1,000 mg Oral 2 times daily 03/08/21 1540 03/08/21         Radiology Reports  No results found.    DVT prophylaxis: SCDs  Code Status: Full code  Family Communication: No family at bedside   Consultants:   Procedures:     Objective    Physical Examination:  General-appears in no acute distress Heart-S1-S2, regular, no murmur auscultated Lungs-decreased breath sounds bilaterally Abdomen-soft, nontender, no organomegaly Extremities-no edema in the lower extremities Neuro-alert, nonverbal, not following commands   Status is: Inpatient  Dispo: The patient is from: Rehab              Anticipated d/c is to: Skilled nursing facility              Anticipated d/c date is: 03/13/2021              Patient currently not stable for discharge  Barrier to discharge-ongoing treatment for aspiration pneumonia  COVID-19 Labs  No results for input(s): DDIMER, FERRITIN, LDH, CRP in the last 72 hours.  Lab Results  Component Value Date   [place] NEGATIVE 03/08/2021   [employer] NEGATIVE 02/27/2021   [place] NEGATIVE 01/23/2021    Microbiology  Recent Results (from the past 240 hour(s))  Resp Panel by RT-PCR (Flu A&B, Covid) Nasopharyngeal Swab     Status: None   Collection Time: 03/08/21  5:17 AM   Specimen: Nasopharyngeal Swab; Nasopharyngeal(NP) swabs in vial transport medium  Result Value Ref Range Status   SARS Coronavirus 2 by RT PCR  NEGATIVE NEGATIVE Final    Comment: (NOTE) SARS-CoV-2 target nucleic acids are NOT DETECTED.  The SARS-CoV-2 RNA is generally detectable in upper respiratory specimens during the acute phase of infection. The lowest concentration of SARS-CoV-2 viral copies this assay can detect is 138 copies/mL. A negative result does not preclude SARS-Cov-2 infection and should not be used as the sole basis for treatment or other patient management decisions. A negative result may occur with  improper specimen collection/handling, submission of specimen other than nasopharyngeal swab, presence of viral mutation(s) within the areas targeted by this assay, and inadequate number of viral copies(<138 copies/mL). A negative result must be combined with clinical observations, patient history, and epidemiological information. The expected result is Negative.  Fact Sheet for Patients:  EntrepreneurPulse.com.au  Fact Sheet for Healthcare Providers:  IncredibleEmployment.be  This test is no t yet approved or cleared by the [place] FDA and  has been authorized for detection and/or diagnosis of SARS-CoV-2 by FDA under an Emergency Use Authorization (EUA). This EUA will remain  in effect (meaning this test can be used) for the duration of the COVID-19 declaration under Section 564(b)(1) of the Act, 21 U.S.C.section 360bbb-3(b)(1), unless the authorization is terminated  or revoked sooner.       Influenza A by PCR NEGATIVE NEGATIVE Final   Influenza B by PCR NEGATIVE NEGATIVE Final    Comment: (NOTE) The Xpert Xpress SARS-CoV-2/FLU/RSV plus assay is intended as an aid in the diagnosis of influenza from Nasopharyngeal swab specimens and should  not be used as a sole basis for treatment. Nasal washings and aspirates are unacceptable for Xpert Xpress SARS-CoV-2/FLU/RSV testing.  Fact Sheet for Patients: EntrepreneurPulse.com.au  Fact Sheet for  Healthcare Providers: IncredibleEmployment.be  This test is not yet approved or cleared by the [place] FDA and has been authorized for detection and/or diagnosis of SARS-CoV-2 by FDA under an Emergency Use Authorization (EUA). This EUA will remain in effect (meaning this test can be used) for the duration of the COVID-19 declaration under Section 564(b)(1) of the Act, 21 U.S.C. section 360bbb-3(b)(1), unless the authorization is terminated or revoked.  Performed at [facility], [place] [address], [place], [place] [address]   Blood Culture (routine x 2)     Status: Abnormal   Collection Time: 03/08/21  5:23 AM   Specimen: BLOOD RIGHT HAND  Result Value Ref Range Status   Specimen Description   Final    BLOOD RIGHT HAND Performed at [facility] [address], [place], [place] [address]    Special Requests   Final    BOTTLES DRAWN AEROBIC AND ANAEROBIC Blood Culture results may not be optimal due to an inadequate volume of blood received in culture bottles Performed at [facility] [address] [place], [place] [address]    Culture  Setup Time   Final    GRAM POSITIVE COCCI IN CLUSTERS AEROBIC BOTTLE ONLY CRITICAL RESULT CALLED TO, READ BACK BY AND VERIFIED WITH: [facility] 2120 03/10/21 A BROWNING    Culture (A)  Final    COAGULASE NEGATIVE STAPHYLOCOCCUS THE SIGNIFICANCE OF ISOLATING THIS ORGANISM FROM A SINGLE SET OF BLOOD CULTURES WHEN MULTIPLE SETS ARE DRAWN IS UNCERTAIN. PLEASE NOTIFY THE MICROBIOLOGY DEPARTMENT WITHIN ONE WEEK IF SPECIATION AND SENSITIVITIES ARE REQUIRED. Performed at [facility], [place] [address] [place], [place] [address]    Report Status 03/12/2021 FINAL  Final  Blood Culture ID Panel (Reflexed)     Status: Abnormal   Collection Time: 03/08/21  5:23 AM  Result Value Ref Range Status   Enterococcus faecalis NOT DETECTED NOT DETECTED Final   Enterococcus Faecium  NOT DETECTED NOT DETECTED Final   Listeria monocytogenes NOT DETECTED NOT DETECTED Final   Staphylococcus species DETECTED (A) NOT DETECTED Final    Comment: CRITICAL RESULT CALLED TO, READ BACK BY AND VERIFIED WITH: M [facility] 2120 03/10/21 A BROWNING    Staphylococcus aureus (BCID) NOT DETECTED NOT DETECTED Final   Staphylococcus epidermidis NOT DETECTED NOT DETECTED Final   Staphylococcus lugdunensis NOT DETECTED NOT DETECTED Final   Streptococcus species NOT DETECTED NOT DETECTED Final   Streptococcus agalactiae NOT DETECTED NOT DETECTED Final   Streptococcus pneumoniae NOT DETECTED NOT DETECTED Final   Streptococcus pyogenes NOT DETECTED NOT DETECTED Final   A.calcoaceticus-baumannii NOT DETECTED NOT DETECTED Final   Bacteroides fragilis NOT DETECTED NOT DETECTED Final   Enterobacterales NOT DETECTED NOT DETECTED Final   Enterobacter cloacae complex NOT DETECTED NOT DETECTED Final   Escherichia coli NOT DETECTED NOT DETECTED Final   Klebsiella aerogenes NOT DETECTED NOT DETECTED Final   Klebsiella oxytoca NOT DETECTED NOT DETECTED Final   Klebsiella pneumoniae NOT DETECTED NOT DETECTED Final   Proteus species NOT DETECTED NOT DETECTED Final   Salmonella species NOT DETECTED NOT DETECTED Final   Serratia marcescens NOT DETECTED NOT DETECTED Final   Haemophilus influenzae NOT DETECTED NOT DETECTED Final   Neisseria meningitidis NOT DETECTED NOT DETECTED Final   Pseudomonas aeruginosa NOT DETECTED  NOT DETECTED Final   Stenotrophomonas maltophilia NOT DETECTED NOT DETECTED Final   Candida albicans NOT DETECTED NOT DETECTED Final   Candida auris NOT DETECTED NOT DETECTED Final   Candida glabrata NOT DETECTED NOT DETECTED Final   Candida krusei NOT DETECTED NOT DETECTED Final   Candida parapsilosis NOT DETECTED NOT DETECTED Final   Candida tropicalis NOT DETECTED NOT DETECTED Final   Cryptococcus neoformans/gattii NOT DETECTED NOT DETECTED Final    Comment: Performed at [facility], [place] [address], [place], [place] [address]  Blood Culture (routine x 2)     Status: None   Collection Time: 03/08/21  1:12 PM   Specimen: BLOOD LEFT HAND  Result Value Ref Range Status   Specimen Description   Final    BLOOD LEFT HAND Performed at [facility] [address], [place], [place] [address]    Special Requests   Final    BOTTLES DRAWN AEROBIC ONLY Blood Culture adequate volume Performed at [facility] [address] [place], [place] [address]    Culture   Final    NO GROWTH 5 DAYS Performed at [facility], [place] [address] [place], [place] [address]    Report Status 03/13/2021 FINAL  Final  Urine culture     Status: None   Collection Time: 03/08/21  7:33 PM   Specimen: In/Out Cath Urine  Result Value Ref Range Status   Specimen Description   Final    IN/OUT CATH URINE Performed at [facility] [address], [place], [place] [address]    Special Requests   Final    NONE Performed at [facility], [place] [address] [place], [place] [address]    Culture   Final    NO GROWTH Performed at [facility], [place] [address] [place], [place] [address]    Report Status 03/10/2021 FINAL  Final  MRSA PCR Screening     Status: None   Collection Time: 03/13/21  1:59 AM  Result Value Ref Range Status   MRSA by PCR NEGATIVE NEGATIVE Final    Comment:        The GeneXpert MRSA Assay (FDA approved for NASAL specimens only), is one component of a comprehensive MRSA colonization surveillance program. It is not intended to diagnose MRSA infection nor to guide or monitor treatment for MRSA infections. Performed at [facility], [place] [address] [place],  [address]     Pressure Injury 02/05/21 Buttocks Left Stage 2 -  Partial thickness loss of dermis presenting as a shallow open injury with a red, pink wound bed without slough. blister ruptured on left  buttock, serosanguinous drainage (Active)  02/05/21 1613  Location: Buttocks  Location Orientation: Left  Staging: Stage 2 -  Partial thickness loss of dermis presenting as a shallow open injury with a red, pink wound bed without slough.  Wound Description (Comments): blister ruptured on left buttock, serosanguinous drainage  Present on Admission: No          [place]   [facility] If 7PM-7AM, please contact night-coverage at [email or web address], Office  [phone]   03/13/2021, 1:50 PM  LOS: 5 days

## 2021-03-13 NOTE — TOC Progression Note (Signed)
Transition of Care [facility]) - Progression Note    Patient Details  Name: Brian [name] MRN: [id number] Date of Birth: January 14, 1936  [facility] CM/SW Contact  Brian Pall, LCSW Phone Number: 03/13/2021, 2:03 PM  Clinical Narrative:     Alerted by MD that pt likely medically ready for SNF return in 1-2 days.  Have spoken with admissions coordinator at [facility], who confirms plan for pt to return there into LTC bed.  Have also updated pt's daughter, Ms. Brian [name].       Expected Discharge Plan and Services           Expected Discharge Date:  (unknown)                                     Social Determinants of Health (SDOH) Interventions    Readmission Risk Interventions No flowsheet data found.

## 2021-03-14 LAB — BASIC METABOLIC PANEL
Anion gap: 8 (ref 5–15)
BUN: 19 mg/dL (ref 8–23)
CO2: 29 mmol/L (ref 22–32)
Calcium: 9 mg/dL (ref 8.9–10.3)
Chloride: 104 mmol/L (ref 98–111)
Creatinine, Ser: 0.75 mg/dL (ref 0.61–1.24)
GFR, Estimated: >60 mL/min (ref >60)
Glucose, Bld: 188 mg/dL — ABNORMAL HIGH (ref 70–99)
Potassium: 6.1 mmol/L — ABNORMAL HIGH (ref 3.5–5.1)
Sodium: 141 mmol/L (ref 135–145)

## 2021-03-14 LAB — GLUCOSE, CAPILLARY
Glucose-Capillary: 183 mg/dL — ABNORMAL HIGH (ref 70–99)
Glucose-Capillary: 207 mg/dL — ABNORMAL HIGH (ref 70–99)
Glucose-Capillary: 152 mg/dL — ABNORMAL HIGH (ref 70–99)
Glucose-Capillary: 130 mg/dL — ABNORMAL HIGH (ref 70–99)

## 2021-03-14 MED ORDER — CARVEDILOL 12.5 MG PO TABS
12.5000 mg | ORAL_TABLET | Freq: Two times a day (BID) | ORAL | Status: DC
  Administered 2021-03-14 – 2021-03-15 (×2): 12.5 mg
  Filled 2021-03-14 (×3): qty 1

## 2021-03-14 MED ORDER — GABAPENTIN 100 MG PO CAPS
100.0000 mg | ORAL_CAPSULE | Freq: Two times a day (BID) | ORAL | Status: DC
  Administered 2021-03-14 – 2021-03-15 (×3): 100 mg via ORAL
  Filled 2021-03-14 (×3): qty 1

## 2021-03-14 MED ORDER — FUROSEMIDE 10 MG/ML IJ SOLN
20.0000 mg | Freq: Two times a day (BID) | INTRAMUSCULAR | Status: AC
  Administered 2021-03-14 – 2021-03-15 (×2): 20 mg via INTRAVENOUS
  Filled 2021-03-14 (×2): qty 2

## 2021-03-14 MED ORDER — AMLODIPINE BESYLATE 10 MG PO TABS
10.0000 mg | ORAL_TABLET | Freq: Every day | ORAL | Status: DC
  Administered 2021-03-14 – 2021-03-15 (×2): 10 mg
  Filled 2021-03-14 (×2): qty 1

## 2021-03-14 MED ORDER — AMIODARONE HCL 200 MG PO TABS
200.0000 mg | ORAL_TABLET | Freq: Every day | ORAL | Status: DC
  Administered 2021-03-14 – 2021-03-15 (×2): 200 mg
  Filled 2021-03-14 (×2): qty 1

## 2021-03-14 MED ORDER — SODIUM ZIRCONIUM CYCLOSILICATE 5 G PO PACK
5.0000 g | PACK | Freq: Two times a day (BID) | ORAL | Status: AC
  Administered 2021-03-14 (×2): 5 g via ORAL
  Filled 2021-03-14 (×2): qty 1

## 2021-03-14 NOTE — Consult Note (Signed)
[facility] CM Inpatient Consult   03/14/2021  Brian [name] [facility] 1936-05-26 [id number]  Patient chart has been reviewed for readmissions less than 30 days and for high risk score for unplanned readmissions.  Patient assessed for community [place] Management follow up needs. Per review, current disposition plan is for [place], [place]. No [facility] Care Management needs.  Netta Cedars, MSN, [facility] Liaison Nurse Mobile Phone [phone]  Toll free office [phone]

## 2021-03-14 NOTE — Progress Notes (Signed)
[facility]  PROGRESS NOTE  Brian [name] XAJ:[id number] DOB: 06/01/1936 DOA: 03/08/2021 PCP: [name], MD   Brief HPI:   85 year old male with medical history of atrial fibrillation, ICH/stroke, diabetes mellitus type 2, dysphagia presents with acute hypoxemic respiratory failure.  Patient is currently at rehab, has been having intermittent lung congestion.  Apparently patient became nauseous and started vomiting.  After that he was noted to be hypoxemic with room air sats of 84%.  He was started on 4 L oxygen via [place].  EMS was called.  Chest x-Hasson showed pneumonia.  Patient started on Rocephin.    Subjective   Patient seen and examined, looks comfortable.  Has diuresed well with IV Lasix.   Assessment/Plan:     Acute hypoxemic respiratory failure -Secondary to aspiration pneumonia; significantly improved, though still requiring oxygen.  -Patient continued to spike fever despite being on antibiotics; now afebrile -He was started on ceftriaxone and Zithromax, ceftriaxone was discontinued and patient started on Unasyn and Zithromax.  Today is day #7 of antibiotics.  Zithromax was discontinued yesterday, will discontinue Unasyn.  6 -Blood cultures 1 out of 2 sets growing coagulase-negative staph aureus.  Likely contamination -Patient was not able to cough up phlegm, started on chest PT every 4 hours with suction as needed.   -He developed +3 L volume overload so started on IV Lasix and has diuresed well.  We will continue with IV Lasix 20 mg every 12 hours for 2 more doses -Follow intake and output -Continue to wean off oxygen  Recent hemorrhagic stroke -Patient has PEG tube in place -Nonverbal, does not follow commands.  Responds to voice -Continue tube feeding -Patient to follow-up with neurosurgery as outpatient  Diabetes mellitus type 2 -Continue insulin detemir 10 units subcu nightly -Sliding scale insulin NovoLog -CBG well controlled  Acute kidney  injury -Creatinine 1.30; baseline creatinine 1.0 -Was started on IV fluids, IV fluids discontinued for concern for developing pulmonary edema -Creatinine this morning is back to baseline 1.0   Hyperkalemia -Potassium was 6.1 today, unclear etiology. -Continue IV Lasix as above -We will give Lokelma 5 g p.o. twice daily for 2 doses. -Follow BMP in am  Hypertension -Blood pressure is now elevated -Restart home medications including amlodipine, carvedilol  Chronic atrial fibrillation -Continue Coreg, amiodarone -Heart rate controlled -Not a candidate for anticoagulation due to recent intracranial hemorrhage  Scheduled medications:    amiodarone  200 mg Per Tube Daily   amLODipine  10 mg Per Tube Daily   carvedilol  12.5 mg Per Tube BID WC   free water  100 mL Per Tube Q4H   furosemide  20 mg Intravenous BID   gabapentin  100 mg Oral BID   insulin aspart  0-15 Units Subcutaneous TID WC   insulin aspart  0-5 Units Subcutaneous QHS   insulin detemir  10 Units Subcutaneous BID   sodium zirconium cyclosilicate  5 g Oral BID         Data Reviewed:   CBG:  Recent Labs  Lab 03/13/21 1135 03/13/21 1719 03/13/21 2137 03/14/21 0732 03/14/21 1133  GLUCAP 162* 144* 167* 183* 207*    SpO2: 100 % O2 Flow Rate (L/min): 2 L/min    Vitals:   03/13/21 1452 03/13/21 2027 03/14/21 0411 03/14/21 1059  BP: (!) 160/82 (!) 157/74 (!) 170/81 (!) 160/90  Pulse: 68 77 73 83  Resp: 14 20 20  (!) 22  Temp: 98.5 F (36.9 C) 98 F (36.7 C) 97.7 F (  36.5 C) 98.8 F (37.1 C)  TempSrc:  Oral Oral   SpO2: 100%  98% 100%  Weight:      Height:         Intake/Output Summary (Last 24 hours) at 03/14/2021 1312 Last data filed at 03/14/2021 0935 Gross per 24 hour  Intake 0 ml  Output 1950 ml  Net -1950 ml    06/26 1901 - 06/28 0700 In: 1200  Out: 1850 [Urine:1850]  Filed Weights   03/11/21 0500 03/12/21 0500 03/13/21 0500  Weight: 101.7 kg 102 kg 102.8 kg    CBC:  Recent  Labs  Lab 03/08/21 0716 03/09/21 0602 03/10/21 0446  WBC 6.8 8.6 8.0  HGB 11.0* 9.4* 9.0*  HCT 38.2* 33.1* 31.7*  PLT 171 153 179  MCV 87.8 89.0 89.8  MCH 25.3* 25.3* 25.5*  MCHC 28.8* 28.4* 28.4*  RDW 16.7* 16.8* 16.9*  LYMPHSABS 0.7  --   --   MONOABS 0.5  --   --   EOSABS 0.0  --   --   BASOSABS 0.0  --   --     Complete metabolic panel:  Recent Labs  Lab 03/08/21 0716 03/08/21 0800 03/08/21 1312 03/09/21 0602 03/09/21 1118 03/10/21 0446 03/14/21 0500  NA  --  139  --  143  --  144 141  K  --  5.3*  --  4.1  --  4.0 6.1*  CL  --  106  --  109  --  111 104  CO2  --  23  --  24  --  25 29  GLUCOSE  --  190*  --  158*  --  167* 188*  BUN  --  40*  --  43*  --  36* 19  CREATININE  --  1.37*  --  1.30*  --  1.00 0.75  CALCIUM  --  8.9  --  9.1  --  9.3 9.0  AST  --  44*  --  28  --  48*  --   ALT  --  25  --  25  --  46*  --   ALKPHOS  --  61  --  60  --  72  --   BILITOT  --  1.6*  --  0.8  --  0.6  --   ALBUMIN  --  3.0*  --  2.8*  --  3.0*  --   LATICACIDVEN  --  1.9 2.0*  --   --   --   --   INR 1.3*  --   --   --   --   --   --   BNP  --   --   --   --  71.4  --   --     No results for input(s): LIPASE, AMYLASE in the last 168 hours.  Recent Labs  Lab 03/08/21 0517 03/09/21 1118  BNP  --  71.4  SARSCOV2NAA NEGATIVE  --     ------------------------------------------------------------------------------------------------------------------ No results for input(s): CHOL, HDL, LDLCALC, TRIG, CHOLHDL, LDLDIRECT in the last 72 hours.  Lab Results  Component Value Date   HGBA1C 6.1 (H) 01/28/2021   ------------------------------------------------------------------------------------------------------------------ No results for input(s): TSH, T4TOTAL, T3FREE, THYROIDAB in the last 72 hours.  Invalid input(s): FREET3 ------------------------------------------------------------------------------------------------------------------ No results for input(s):  VITAMINB12, FOLATE, FERRITIN, TIBC, IRON, RETICCTPCT in the last 72 hours.  Coagulation profile Recent Labs  Lab 03/08/21 0716  INR 1.3*   No results  for input(s): DDIMER in the last 72 hours.  Cardiac Enzymes No results for input(s): CKTOTAL, CKMB, CKMBINDEX, TROPONINI in the last 168 hours.  ------------------------------------------------------------------------------------------------------------------    Component Value Date/Time   BNP 71.4 03/09/2021 1118     Antibiotics: Anti-infectives (From admission, onward)    Start     Dose/Rate Route Frequency Ordered Stop   03/09/21 1915  Ampicillin-Sulbactam (UNASYN) 3 g in sodium chloride 0.9 % 100 mL IVPB        3 g 200 mL/hr over 30 Minutes Intravenous Every 6 hours 03/09/21 1818     03/08/21 0645  amoxicillin (AMOXIL) capsule 1,000 mg  Status:  Discontinued        1,000 mg Oral  Once 03/08/21 0635 03/08/21 0636   03/08/21 0530  cefTRIAXone (ROCEPHIN) 2 g in sodium chloride 0.9 % 100 mL IVPB  Status:  Discontinued        2 g 200 mL/hr over 30 Minutes Intravenous Every 24 hours 03/08/21 0518 03/09/21 1811   03/08/21 0530  azithromycin (ZITHROMAX) 500 mg in sodium chloride 0.9 % 250 mL IVPB  Status:  Discontinued        500 mg 250 mL/hr over 60 Minutes Intravenous Every 24 hours 03/08/21 0518 03/13/21 1352   03/08/21 0000  amoxicillin (AMOXIL) 500 MG capsule  Status:  Discontinued        1,000 mg Oral 2 times daily 03/08/21 4098 03/08/21         Radiology Reports  No results found.    DVT prophylaxis: SCDs  Code Status: Full code  Family Communication: No family at bedside   Consultants:   Procedures:     Objective    Physical Examination:  General-appears in no acute distress Heart-S1-S2, regular, no murmur auscultated Lungs-scattered rhonchi bilaterally Abdomen-soft, nontender, no organomegaly Extremities-no edema in the lower extremities Neuro-alert, nonverbal  Status is:  Inpatient  Dispo: The patient is from: Rehab              Anticipated d/c is to: Skilled nursing facility              Anticipated d/c date is: 03/15/2021              Patient currently not stable for discharge  Barrier to discharge-ongoing treatment for aspiration pneumonia  COVID-19 Labs  No results for input(s): DDIMER, FERRITIN, LDH, CRP in the last 72 hours.  Lab Results  Component Value Date   [place] NEGATIVE 03/08/2021   [place] NEGATIVE 02/27/2021   [place] NEGATIVE 01/23/2021    Microbiology  Recent Results (from the past 240 hour(s))  Resp Panel by RT-PCR (Flu A&B, Covid) Nasopharyngeal Swab     Status: None   Collection Time: 03/08/21  5:17 AM   Specimen: Nasopharyngeal Swab; Nasopharyngeal(NP) swabs in vial transport medium  Result Value Ref Range Status   SARS Coronavirus 2 by RT PCR NEGATIVE NEGATIVE Final    Comment: (NOTE) SARS-CoV-2 target nucleic acids are NOT DETECTED.  The SARS-CoV-2 RNA is generally detectable in upper respiratory specimens during the acute phase of infection. The lowest concentration of SARS-CoV-2 viral copies this assay can detect is 138 copies/mL. A negative result does not preclude SARS-Cov-2 infection and should not be used as the sole basis for treatment or other patient management decisions. A negative result may occur with  improper specimen collection/handling, submission of specimen other than nasopharyngeal swab, presence of viral mutation(s) within the areas targeted by this assay, and inadequate number of viral copies(<138  copies/mL). A negative result must be combined with clinical observations, patient history, and epidemiological information. The expected result is Negative.  Fact Sheet for Patients:  EntrepreneurPulse.com.au  Fact Sheet for Healthcare Providers:  IncredibleEmployment.be  This test is no t yet approved or cleared by the [place] FDA and  has  been authorized for detection and/or diagnosis of SARS-CoV-2 by FDA under an Emergency Use Authorization (EUA). This EUA will remain  in effect (meaning this test can be used) for the duration of the COVID-19 declaration under Section 564(b)(1) of the Act, 21 U.S.C.section 360bbb-3(b)(1), unless the authorization is terminated  or revoked sooner.       Influenza A by PCR NEGATIVE NEGATIVE Final   Influenza B by PCR NEGATIVE NEGATIVE Final    Comment: (NOTE) The Xpert Xpress SARS-CoV-2/FLU/RSV plus assay is intended as an aid in the diagnosis of influenza from Nasopharyngeal swab specimens and should not be used as a sole basis for treatment. Nasal washings and aspirates are unacceptable for Xpert Xpress SARS-CoV-2/FLU/RSV testing.  Fact Sheet for Patients: EntrepreneurPulse.com.au  Fact Sheet for Healthcare Providers: IncredibleEmployment.be  This test is not yet approved or cleared by the [place] FDA and has been authorized for detection and/or diagnosis of SARS-CoV-2 by FDA under an Emergency Use Authorization (EUA). This EUA will remain in effect (meaning this test can be used) for the duration of the COVID-19 declaration under Section 564(b)(1) of the Act, 21 U.S.C. section 360bbb-3(b)(1), unless the authorization is terminated or revoked.  Performed at [facility], [place] [address], [place], [place] [address]   Blood Culture (routine x 2)     Status: Abnormal   Collection Time: 03/08/21  5:23 AM   Specimen: BLOOD RIGHT HAND  Result Value Ref Range Status   Specimen Description   Final    BLOOD RIGHT HAND Performed at [facility] [address], [place], [place] [address]    Special Requests   Final    BOTTLES DRAWN AEROBIC AND ANAEROBIC Blood Culture results may not be optimal due to an inadequate volume of blood received in culture bottles Performed at [place] [address] [place], [place] [address]    Culture  Setup Time   Final    GRAM POSITIVE COCCI IN CLUSTERS AEROBIC BOTTLE ONLY CRITICAL RESULT CALLED TO, READ BACK BY AND VERIFIED WITH: Colin Rhein [facility] 2120 03/10/21 A BROWNING    Culture (A)  Final    COAGULASE NEGATIVE STAPHYLOCOCCUS THE SIGNIFICANCE OF ISOLATING THIS ORGANISM FROM A SINGLE SET OF BLOOD CULTURES WHEN MULTIPLE SETS ARE DRAWN IS UNCERTAIN. PLEASE NOTIFY THE MICROBIOLOGY DEPARTMENT WITHIN ONE WEEK IF SPECIATION AND SENSITIVITIES ARE REQUIRED. Performed at [facility], [place] [address], [place], [place] [address]    Report Status 03/12/2021 FINAL  Final  Blood Culture ID Panel (Reflexed)     Status: Abnormal   Collection Time: 03/08/21  5:23 AM  Result Value Ref Range Status   Enterococcus faecalis NOT DETECTED NOT DETECTED Final   Enterococcus Faecium NOT DETECTED NOT DETECTED Final   Listeria monocytogenes NOT DETECTED NOT DETECTED Final   Staphylococcus species DETECTED (A) NOT DETECTED Final    Comment: CRITICAL RESULT CALLED TO, READ BACK BY AND VERIFIED WITH: Colin Rhein [facility] 2120 03/10/21 A BROWNING    Staphylococcus aureus (BCID) NOT DETECTED NOT DETECTED Final   Staphylococcus epidermidis NOT DETECTED NOT DETECTED Final   Staphylococcus lugdunensis NOT DETECTED NOT DETECTED Final  Streptococcus species NOT DETECTED NOT DETECTED Final   Streptococcus agalactiae NOT DETECTED NOT DETECTED Final   Streptococcus pneumoniae NOT DETECTED NOT DETECTED Final   Streptococcus pyogenes NOT DETECTED NOT DETECTED Final   A.calcoaceticus-baumannii NOT DETECTED NOT DETECTED Final   Bacteroides fragilis NOT DETECTED NOT DETECTED Final   Enterobacterales NOT DETECTED NOT DETECTED Final   Enterobacter cloacae complex NOT DETECTED NOT DETECTED Final   Escherichia coli NOT DETECTED NOT DETECTED Final   Klebsiella aerogenes NOT DETECTED NOT DETECTED Final   Klebsiella oxytoca NOT DETECTED NOT DETECTED Final   Klebsiella  pneumoniae NOT DETECTED NOT DETECTED Final   Proteus species NOT DETECTED NOT DETECTED Final   Salmonella species NOT DETECTED NOT DETECTED Final   Serratia marcescens NOT DETECTED NOT DETECTED Final   Haemophilus influenzae NOT DETECTED NOT DETECTED Final   Neisseria meningitidis NOT DETECTED NOT DETECTED Final   Pseudomonas aeruginosa NOT DETECTED NOT DETECTED Final   Stenotrophomonas maltophilia NOT DETECTED NOT DETECTED Final   Candida albicans NOT DETECTED NOT DETECTED Final   Candida auris NOT DETECTED NOT DETECTED Final   Candida glabrata NOT DETECTED NOT DETECTED Final   Candida krusei NOT DETECTED NOT DETECTED Final   Candida parapsilosis NOT DETECTED NOT DETECTED Final   Candida tropicalis NOT DETECTED NOT DETECTED Final   Cryptococcus neoformans/gattii NOT DETECTED NOT DETECTED Final    Comment: Performed at [facility], [address], [place], [place] [address]  Blood Culture (routine x 2)     Status: None   Collection Time: 03/08/21  1:12 PM   Specimen: BLOOD LEFT HAND  Result Value Ref Range Status   Specimen Description   Final    BLOOD LEFT HAND Performed at [facility] [address] [place], [place] [address]    Special Requests   Final    BOTTLES DRAWN AEROBIC ONLY Blood Culture adequate volume Performed at [place] [facility] [address], [place], [place] [address]    Culture   Final    NO GROWTH 5 DAYS Performed at [facility], [place] [address], [place], [place] [address]    Report Status 03/13/2021 FINAL  Final  Urine culture     Status: None   Collection Time: 03/08/21  7:33 PM   Specimen: In/Out Cath Urine  Result Value Ref Range Status   Specimen Description   Final    IN/OUT CATH URINE Performed at [facility] [address] [place], [place] [address]    Special Requests   Final    NONE Performed at [facility], [place] [address] [place], [place] [address]     Culture   Final    NO GROWTH Performed at [facility], [place] [address] [place], [place] [address]    Report Status 03/10/2021 FINAL  Final  MRSA PCR Screening     Status: None   Collection Time: 03/13/21  1:59 AM  Result Value Ref Range Status   MRSA by PCR NEGATIVE NEGATIVE Final    Comment:        The GeneXpert MRSA Assay (FDA approved for NASAL specimens only), is one component of a comprehensive MRSA colonization surveillance program. It is not intended to diagnose MRSA infection nor to guide or monitor treatment for MRSA infections. Performed at [facility], [place] [address] [place], [place] [address]     Pressure Injury 02/05/21 Buttocks Left Stage 2 -  Partial thickness loss of  dermis presenting as a shallow open injury with a red, pink wound bed without slough. blister ruptured on left buttock, serosanguinous drainage (Active)  02/05/21 1613  Location: Buttocks  Location Orientation: Left  Staging: Stage 2 -  Partial thickness loss of dermis presenting as a shallow open injury with a red, pink wound bed without slough.  Wound Description (Comments): blister ruptured on left buttock, serosanguinous drainage  Present on Admission: No     Pressure Injury 03/13/21 Buttocks Right Stage 2 -  Partial thickness loss of dermis presenting as a shallow open injury with a red, pink wound bed without slough. square open skin (Active)  03/13/21 1910  Location: Buttocks  Location Orientation: Right  Staging: Stage 2 -  Partial thickness loss of dermis presenting as a shallow open injury with a red, pink wound bed without slough.  Wound Description (Comments): square open skin  Present on Admission: No     Pressure Injury 03/13/21 Thigh Left;Posterior;Proximal Stage 1 -  Intact skin with non-blanchable redness of a localized area usually over a bony prominence. blister (Active)  03/13/21 1913  Location: Thigh  Location Orientation:  Left;Posterior;Proximal  Staging: Stage 1 -  Intact skin with non-blanchable redness of a localized area usually over a bony prominence.  Wound Description (Comments): blister  Present on Admission: No     Pressure Injury 03/13/21 Coccyx Medial Stage 3 -  Full thickness tissue loss. Subcutaneous fat may be visible but bone, tendon or muscle are NOT exposed. LONG IN CREASE OF BUTTOCKS CHEEKS (Active)  03/13/21 1914  Location: Coccyx  Location Orientation: Medial  Staging: Stage 3 -  Full thickness tissue loss. Subcutaneous fat may be visible but bone, tendon or muscle are NOT exposed.  Wound Description (Comments): LONG IN CREASE OF BUTTOCKS CHEEKS  Present on Admission:       [place]   [facility] If 7PM-7AM, please contact night-coverage at [email or web address], Office  [phone]   03/14/2021, 1:12 PM  LOS: 6 days

## 2021-03-14 NOTE — Plan of Care (Signed)

## 2021-03-15 DIAGNOSIS — J189 Pneumonia, unspecified organism: Secondary | ICD-10-CM | POA: Diagnosis not present

## 2021-03-15 DIAGNOSIS — I69351 Hemiplegia and hemiparesis following cerebral infarction affecting right dominant side: Secondary | ICD-10-CM | POA: Diagnosis not present

## 2021-03-15 DIAGNOSIS — I69391 Dysphagia following cerebral infarction: Secondary | ICD-10-CM | POA: Diagnosis not present

## 2021-03-15 DIAGNOSIS — I469 Cardiac arrest, cause unspecified: Secondary | ICD-10-CM | POA: Diagnosis not present

## 2021-03-15 DIAGNOSIS — M6281 Muscle weakness (generalized): Secondary | ICD-10-CM | POA: Diagnosis not present

## 2021-03-15 DIAGNOSIS — Z7401 Bed confinement status: Secondary | ICD-10-CM | POA: Diagnosis not present

## 2021-03-15 DIAGNOSIS — R4182 Altered mental status, unspecified: Secondary | ICD-10-CM | POA: Diagnosis not present

## 2021-03-15 DIAGNOSIS — J690 Pneumonitis due to inhalation of food and vomit: Secondary | ICD-10-CM | POA: Diagnosis not present

## 2021-03-15 DIAGNOSIS — J9601 Acute respiratory failure with hypoxia: Principal | ICD-10-CM

## 2021-03-15 DIAGNOSIS — G8191 Hemiplegia, unspecified affecting right dominant side: Secondary | ICD-10-CM | POA: Diagnosis not present

## 2021-03-15 DIAGNOSIS — R293 Abnormal posture: Secondary | ICD-10-CM | POA: Diagnosis not present

## 2021-03-15 DIAGNOSIS — M24541 Contracture, right hand: Secondary | ICD-10-CM | POA: Diagnosis not present

## 2021-03-15 DIAGNOSIS — Z743 Need for continuous supervision: Secondary | ICD-10-CM | POA: Diagnosis not present

## 2021-03-15 LAB — BASIC METABOLIC PANEL
Anion gap: 10 (ref 5–15)
BUN: 24 mg/dL — ABNORMAL HIGH (ref 8–23)
CO2: 29 mmol/L (ref 22–32)
Calcium: 9.3 mg/dL (ref 8.9–10.3)
Chloride: 105 mmol/L (ref 98–111)
Creatinine, Ser: 0.86 mg/dL (ref 0.61–1.24)
GFR, Estimated: >60 mL/min (ref >60)
Glucose, Bld: 174 mg/dL — ABNORMAL HIGH (ref 70–99)
Potassium: 4.3 mmol/L (ref 3.5–5.1)
Sodium: 144 mmol/L (ref 135–145)

## 2021-03-15 LAB — CBC
HCT: 34.1 % — ABNORMAL LOW (ref 39.0–52.0)
Hemoglobin: 9.8 g/dL — ABNORMAL LOW (ref 13.0–17.0)
MCH: 24.8 pg — ABNORMAL LOW (ref 26.0–34.0)
MCHC: 28.7 g/dL — ABNORMAL LOW (ref 30.0–36.0)
MCV: 86.3 fL (ref 80.0–100.0)
Platelets: 312 10*3/uL (ref 150–400)
RBC: 3.95 MIL/uL — ABNORMAL LOW (ref 4.22–5.81)
RDW: 17.5 % — ABNORMAL HIGH (ref 11.5–15.5)
WBC: 7.8 10*3/uL (ref 4.0–10.5)
nRBC: 0.4 % — ABNORMAL HIGH (ref 0.0–0.2)

## 2021-03-15 LAB — RESP PANEL BY RT-PCR (FLU A&B, COVID) ARPGX2
Influenza A by PCR: NEGATIVE
Influenza B by PCR: NEGATIVE
SARS Coronavirus 2 by RT PCR: NEGATIVE

## 2021-03-15 LAB — GLUCOSE, CAPILLARY
Glucose-Capillary: 174 mg/dL — ABNORMAL HIGH (ref 70–99)
Glucose-Capillary: 180 mg/dL — ABNORMAL HIGH (ref 70–99)
Glucose-Capillary: 141 mg/dL — ABNORMAL HIGH (ref 70–99)

## 2021-03-15 MED ORDER — ATORVASTATIN CALCIUM 80 MG PO TABS: 80.0000 mg | ORAL_TABLET | Freq: Every evening | ORAL | Status: AC

## 2021-03-15 MED ORDER — CARVEDILOL 12.5 MG PO TABS: 12.5000 mg | ORAL_TABLET | Freq: Two times a day (BID) | ORAL | Status: AC

## 2021-03-15 MED ORDER — AMLODIPINE BESYLATE 10 MG PO TABS: 10.0000 mg | ORAL_TABLET | Freq: Every day | ORAL | Status: AC

## 2021-03-15 MED ORDER — GABAPENTIN 100 MG PO CAPS: 100.0000 mg | ORAL_CAPSULE | Freq: Two times a day (BID) | ORAL | Status: AC

## 2021-03-15 MED ORDER — INSULIN DETEMIR 100 UNIT/ML ~~LOC~~ SOLN: 10.0000 [IU] | Freq: Two times a day (BID) | SUBCUTANEOUS | Status: AC

## 2021-03-15 NOTE — TOC Transition Note (Signed)
Transition of Care [facility]) - CM/SW Discharge Note   Patient Details  Name: KIMONI PAGLIARULO MRN: [id number] Date of Birth: 1936/02/26  Transition of Care [facility]) CM/SW Contact:  Trish Mage, LCSW Phone Number: 03/15/2021, 11:49 AM   Clinical Narrative:   Patient who is sable for d/c today will return to [facility] via [facility].  No FL2 necessary, according to [facility].  Rapid Covid test ordered. PTAR arranged.  Nursing, please call report to [phone], room 201A. TOC sign off.    Final next level of care: Long Term Nursing Home Barriers to Discharge: Barriers Resolved   Patient Goals and CMS Choice        Discharge Placement                       Discharge Plan and Services                                     Social Determinants of Health (SDOH) Interventions     Readmission Risk Interventions No flowsheet data found.

## 2021-03-15 NOTE — Progress Notes (Signed)
PTAR here to pick pt up and transport to SNF. VS updated, IV removed, tube feeding stopped and removed. Will call nurse at SNF and pts brother to give report.

## 2021-03-15 NOTE — Clinical Social Work Note (Signed)
Nemiah, Kissner #[id number] (CSN: [id number]) (85 y.o. M) (Adm: 03/08/21) [id number]  Microbiology Results  Procedure Component Value Units Date/Time  Resp Panel by RT-PCR (Flu A&B, Covid) Nasopharyngeal Swab [[id number]] Collected: 03/15/21 1139  Order Status: Completed Specimen: Nasopharyngeal(NP) swabs in vial transport medium from Nasopharyngeal Swab Updated: 03/15/21 1230   SARS Coronavirus 2 by RT PCR NEGATIVE   Comment: (NOTE)  SARS-CoV-2 target nucleic acids are NOT DETECTED.   The SARS-CoV-2 RNA is generally detectable in upper respiratory  specimens during the acute phase of infection. The lowest  concentration of SARS-CoV-2 viral copies this assay can detect is  138 copies/mL. A negative result does not preclude SARS-Cov-2  infection and should not be used as the sole basis for treatment or  other patient management decisions. A negative result may occur with  improper specimen collection/handling, submission of specimen other  than nasopharyngeal swab, presence of viral mutation(s) within the  areas targeted by this assay, and inadequate number of viral  copies(<138 copies/mL). A negative result must be combined with  clinical observations, patient history, and epidemiological  information. The expected result is Negative.   Fact Sheet for Patients:  EntrepreneurPulse.com.au   Fact Sheet for Healthcare Providers:  IncredibleEmployment.be   This test is not yet approved or cleared by the [place] FDA and  has been authorized for detection and/or diagnosis of SARS-CoV-2 by  FDA under an Emergency Use Authorization (EUA). This EUA will remain  in effect (meaning this test can be used) for the duration of the  COVID-19 declaration under Section 564(b)(1) of the Act, 21  U.S.C.section 360bbb-3(b)(1), unless the authorization is terminated  or revoked sooner.        Influenza A by PCR NEGATIVE   Influenza B by PCR NEGATIVE    Comment: (NOTE)  The Xpert Xpress SARS-CoV-2/FLU/RSV plus assay is intended as an aid  in the diagnosis of influenza from Nasopharyngeal swab specimens and  should not be used as a sole basis for treatment. Nasal washings and  aspirates are unacceptable for Xpert Xpress SARS-CoV-2/FLU/RSV  testing.   Fact Sheet for Patients:  EntrepreneurPulse.com.au   Fact Sheet for Healthcare Providers:  IncredibleEmployment.be   This test is not yet approved or cleared by the [place] FDA and  has been authorized for detection and/or diagnosis of SARS-CoV-2 by  FDA under an Emergency Use Authorization (EUA). This EUA will remain  in effect (meaning this test can be used) for the duration of the  COVID-19 declaration under Section 564(b)(1) of the Act, 21 U.S.C.  section 360bbb-3(b)(1), unless the authorization is terminated or  revoked.   Performed at [facility], [place] [address]  [place], [place] [address]

## 2021-03-15 NOTE — Discharge Summary (Signed)
Physician Discharge Summary  Rayhaan Huster Montella HWE:[id number] DOB: 1935-11-19 DOA: 03/08/2021  PCP: [name], MD  Admit date: 03/08/2021 Discharge date: 03/15/2021  Admitted From: SNF Disposition: SNF  Recommendations for Outpatient Follow-up:  Follow up with SNF provider at earliest convenience with repeat CBC/BMP next few days Outpatient follow-up with palliative care for goals of care discussion Follow up in ED if symptoms worsen or new appear   Home Health: No Equipment/Devices: None  Discharge Condition: Guarded CODE STATUS: Full Diet recommendation: Heart healthy/carb modified/tube feeding diet as per dietary recommendations  Brief/Interim Summary: 85 year old male with history of chronic atrial fibrillation, recent ICH/stroke and subsequent dysphagia requiring PEG tube feeding, diabetes mellitus type 2 presented with acute hypoxic respiratory failure most likely secondary to aspiration pneumonia.  He required supplemental oxygen and was treated with antibiotics.  His condition has improved.  He has completed a course of antibiotics which have been discontinued.  He will be discharged back to SNF today.  Discharge Diagnoses:   Acute hypoxic respiratory failure Aspiration pneumonia -Required up to 4 L oxygen via nasal cannula.  Currently intermittently requiring 2 L of oxygen. -Patient has received a full course of IV antibiotics including initially Rocephin and Zithromax and subsequently Unasyn.  DC Unasyn today.  No need for any more antibiotics on discharge -Patient is at high risk for Recurrent aspirations because of his recent ICH/PEG tube feeding -Patient also got treated with IV Lasix over the last couple of days for volume overload.  No need for any more Lasix on discharge.  He has diuresed well  Recent hemorrhagic stroke Dysphagia status post PEG tube feeding -Nonverbal, does not follow commands. -Tolerating PEG tube feeding.  Continue tube feeding upon discharge.   Outpatient follow-up with neurosurgery -Full code: Recommend outpatient follow-up with palliative care regarding goals of care discussion  Diabetes mellitus type 2 -Continue reduced dose of long-acting insulin on discharge.  Carb modified but if eating.  Outpatient follow-up  Hyperkalemia -Questionable cause.  Potassium was 6.1 on 03/14/2021 which was treated with Lokelma.  Potassium has normalized.  Will not resume losartan on discharge.  Outpatient follow-up  Hypertension -Blood pressure slightly elevated.  Continue amlodipine and Coreg.  Hold losartan  Chronic atrial fibrillation -Continue Coreg and amiodarone.  Currently heart rate is controlled.  Not a candidate for anticoagulation due to recent intracranial hemorrhage  Discharge Instructions  Discharge Instructions     Amb Referral to Palliative Care   Complete by: As directed    Goals of care   Diet - low sodium heart healthy   Complete by: As directed    Diet Carb Modified   Complete by: As directed    Discharge wound care:   Complete by: As directed    Wound care as per prior wound care recommendations   Increase activity slowly   Complete by: As directed       Allergies as of 03/15/2021   No Known Allergies      Medication List     STOP taking these medications    insulin aspart 100 UNIT/ML injection Commonly known as: novoLOG   Klor-Con M20 20 MEQ tablet Generic drug: potassium chloride SA   losartan 100 MG tablet Commonly known as: COZAAR       TAKE these medications    acetaminophen 650 MG CR tablet Commonly known as: TYLENOL Take 650 mg by mouth every 6 (six) hours as needed for pain. What changed: Another medication with the same name was removed.  Continue taking this medication, and follow the directions you see here.   amiodarone 200 MG tablet Commonly known as: PACERONE Place 1 tablet (200 mg total) into feeding tube daily.   amLODipine 10 MG tablet Commonly known as: NORVASC Place  1 tablet (10 mg total) into feeding tube daily.   atorvastatin 80 MG tablet Commonly known as: LIPITOR Place 1 tablet (80 mg total) into feeding tube every evening. What changed: See the new instructions.   carvedilol 12.5 MG tablet Commonly known as: COREG Place 1 tablet (12.5 mg total) into feeding tube 2 (two) times daily with a meal.   feeding supplement (JEVITY 1.5 CAL) Liqd Place 30 mLs into feeding tube continuous.   free water Soln Place 100 mLs into feeding tube every 4 (four) hours.   gabapentin 100 MG capsule Commonly known as: NEURONTIN Take 1 capsule (100 mg total) by mouth 2 (two) times daily. What changed: See the new instructions.   guaifenesin 100 MG/5ML syrup Commonly known as: ROBITUSSIN Place 200 mg into feeding tube 2 (two) times daily.   insulin detemir 100 UNIT/ML injection Commonly known as: LEVEMIR Inject 0.1 mLs (10 Units total) into the skin 2 (two) times daily. What changed: how much to take   insulin lispro 100 UNIT/ML injection Commonly known as: HUMALOG Inject 0-14 Units into the skin See admin instructions. Sliding scale as follows: Check blood sugar three times daily. If blood sugar is <200=0u, 201-250=2u, 251-300=4u, 301-350=6u, 351-400=8u, 401-450=10u, 451-500=12u, if reads HIGH give 14u, recheck in 2 hours, and if blood sugar is >400  or <100 call [facility] (Do not change times)   Insulin Pen Needle 32G X 4 MM Misc Use 1x a day   Lancets Misc Use as directed twice daily E11.9   LUBRICATING TEARS EYE DROPS OP Place 1 drop into both eyes at bedtime as needed (dry eyes).   metFORMIN 500 MG tablet Commonly known as: GLUCOPHAGE Place 500 mg into feeding tube 2 (two) times daily with a meal. What changed: Another medication with the same name was removed. Continue taking this medication, and follow the directions you see here.   OneTouch Verio test strip Generic drug: glucose blood USE AS DIRECTED TWICE DAILY E11.9   OneTouch Verio w/Device  Kit Use as directed twice daily E11.9   sitaGLIPtin 50 MG tablet Commonly known as: JANUVIA Place 50 mg into feeding tube daily. What changed: Another medication with the same name was removed. Continue taking this medication, and follow the directions you see here.               Discharge Care Instructions  (From admission, onward)           Start     Ordered   03/15/21 0000  Discharge wound care:       Comments: Wound care as per prior wound care recommendations   03/15/21 1053            Follow-up Information     [name], MD. Schedule an appointment as soon as possible for a visit in 1 week(s).   Specialties: Internal Medicine, Radiology Contact information: [place] [address] [phone]         Fay Records, MD .   Specialty: Cardiology Contact information: [place] [address] [phone]                No Known Allergies  Consultations: None   Procedures/Studies: DG  ABDOMEN PEG TUBE LOCATION  Result Date: 02/13/2021 CLINICAL DATA:  Peg tube placement EXAM: ABDOMEN - 1 VIEW COMPARISON:  None. FINDINGS: Contrast is hand injected through the PEG tube opacifying the stomach. Peg tube is in satisfactory position. No extraluminal contrast to suggest extravasation. No bowel dilatation to suggest obstruction. No evidence of pneumoperitoneum, portal venous gas or pneumatosis. No pathologic calcifications along the expected course of the ureters. No acute osseous abnormality. IMPRESSION: Peg tube is in satisfactory position. No extraluminal contrast to suggest extravasation. Electronically Signed   By: Kathreen Devoid   On: 02/13/2021 15:22   DG Chest Port 1 View  Result Date: 03/09/2021 CLINICAL DATA:  Dyspnea. EXAM: PORTABLE CHEST 1 VIEW COMPARISON:  03/08/2021 FINDINGS: Shallow lung inflation. Heart size is enlarged and accentuated by AP technique. There has been some improvement in  aeration of both lungs. Persistent airspace filling opacities are identified in the LOWER lungs. IMPRESSION: Improved aeration.  Persistent infiltrates bilaterally. Electronically Signed   By: Nolon Nations M.D.   On: 03/09/2021 11:20   DG Chest Port 1 View  Result Date: 03/08/2021 CLINICAL DATA:  Questionable sepsis. Shortness of breath. Low oxygen saturations. EXAM: PORTABLE CHEST 1 VIEW COMPARISON:  Chest x-Tristen 02/11/2021. FINDINGS: Borderline cardiomegaly. Bilateral pulmonary infiltrates/edema. No pleural effusion or pneumothorax. Degenerative change thoracic spine. IMPRESSION: Borderline cardiomegaly.  Bilateral pulmonary infiltrates/edema. Electronically Signed   By: Marcello Moores  Register   On: 03/08/2021 06:00      Subjective: Patient seen and examined at bedside.  Nonverbal.  No overnight fever, vomiting or seizures reported.  Discharge Exam: Vitals:   03/14/21 2027 03/15/21 0443  BP: (!) 137/96 (!) 151/72  Pulse: 80 71  Resp: 18 18  Temp: 98.7 F (37.1 C) 99.2 F (37.3 C)  SpO2: 96% 100%    General: Pt is awake, nonverbal.  Currently intermittently requiring 2 L oxygen by nasal cannula.   Cardiovascular: rate controlled, S1/S2 + Respiratory: bilateral decreased breath sounds at bases with some scattered crackles Abdominal: Soft, NT, ND, bowel sounds +.  PEG tube present Extremities: Trace lower extremity edema.  No cyanosis    The results of significant diagnostics from this hospitalization (including imaging, microbiology, ancillary and laboratory) are listed below for reference.     Microbiology: Recent Results (from the past 240 hour(s))  Resp Panel by RT-PCR (Flu A&B, Covid) Nasopharyngeal Swab     Status: None   Collection Time: 03/08/21  5:17 AM   Specimen: Nasopharyngeal Swab; Nasopharyngeal(NP) swabs in vial transport medium  Result Value Ref Range Status   SARS Coronavirus 2 by RT PCR NEGATIVE NEGATIVE Final    Comment: (NOTE) SARS-CoV-2 target nucleic acids  are NOT DETECTED.  The SARS-CoV-2 RNA is generally detectable in upper respiratory specimens during the acute phase of infection. The lowest concentration of SARS-CoV-2 viral copies this assay can detect is 138 copies/mL. A negative result does not preclude SARS-Cov-2 infection and should not be used as the sole basis for treatment or other patient management decisions. A negative result may occur with  improper specimen collection/handling, submission of specimen other than nasopharyngeal swab, presence of viral mutation(s) within the areas targeted by this assay, and inadequate number of viral copies(<138 copies/mL). A negative result must be combined with clinical observations, patient history, and epidemiological information. The expected result is Negative.  Fact Sheet for Patients:  EntrepreneurPulse.com.au  Fact Sheet for Healthcare Providers:  IncredibleEmployment.be  This test is no t yet approved or cleared by the [place] and  has been authorized for detection and/or diagnosis of SARS-CoV-2 by FDA under an Emergency Use Authorization (EUA). This EUA will remain  in effect (meaning this test can be used) for the duration of the COVID-19 declaration under Section 564(b)(1) of the Act, 21 U.S.C.section 360bbb-3(b)(1), unless the authorization is terminated  or revoked sooner.       Influenza A by PCR NEGATIVE NEGATIVE Final   Influenza B by PCR NEGATIVE NEGATIVE Final    Comment: (NOTE) The Xpert Xpress SARS-CoV-2/FLU/RSV plus assay is intended as an aid in the diagnosis of influenza from Nasopharyngeal swab specimens and should not be used as a sole basis for treatment. Nasal washings and aspirates are unacceptable for Xpert Xpress SARS-CoV-2/FLU/RSV testing.  Fact Sheet for Patients: EntrepreneurPulse.com.au  Fact Sheet for Healthcare Providers: IncredibleEmployment.be  This test is  not yet approved or cleared by the [place] FDA and has been authorized for detection and/or diagnosis of SARS-CoV-2 by FDA under an Emergency Use Authorization (EUA). This EUA will remain in effect (meaning this test can be used) for the duration of the COVID-19 declaration under Section 564(b)(1) of the Act, 21 U.S.C. section 360bbb-3(b)(1), unless the authorization is terminated or revoked.  Performed at [facility], [place] [address], [place], [place] [address]   Blood Culture (routine x 2)     Status: Abnormal   Collection Time: 03/08/21  5:23 AM   Specimen: BLOOD RIGHT HAND  Result Value Ref Range Status   Specimen Description   Final    BLOOD RIGHT HAND Performed at [facility] [address], [place], [place] [address]    Special Requests   Final    BOTTLES DRAWN AEROBIC AND ANAEROBIC Blood Culture results may not be optimal due to an inadequate volume of blood received in culture bottles Performed at [facility] [address] [place], [place] [address]    Culture  Setup Time   Final    GRAM POSITIVE COCCI IN CLUSTERS AEROBIC BOTTLE ONLY CRITICAL RESULT CALLED TO, READ BACK BY AND VERIFIED WITH: Colin Rhein [facility] 2120 03/10/21 A BROWNING    Culture (A)  Final    COAGULASE NEGATIVE STAPHYLOCOCCUS THE SIGNIFICANCE OF ISOLATING THIS ORGANISM FROM A SINGLE SET OF BLOOD CULTURES WHEN MULTIPLE SETS ARE DRAWN IS UNCERTAIN. PLEASE NOTIFY THE MICROBIOLOGY DEPARTMENT WITHIN ONE WEEK IF SPECIATION AND SENSITIVITIES ARE REQUIRED. Performed at [facility], [place] [address], [place], [place] [address]    Report Status 03/12/2021 FINAL  Final  Blood Culture ID Panel (Reflexed)     Status: Abnormal   Collection Time: 03/08/21  5:23 AM  Result Value Ref Range Status   Enterococcus faecalis NOT DETECTED NOT DETECTED Final   Enterococcus Faecium NOT DETECTED NOT DETECTED Final   Listeria monocytogenes NOT DETECTED NOT  DETECTED Final   Staphylococcus species DETECTED (A) NOT DETECTED Final    Comment: CRITICAL RESULT CALLED TO, READ BACK BY AND VERIFIED WITH: Colin Rhein PHARMD 2120 03/10/21 A BROWNING    Staphylococcus aureus (BCID) NOT DETECTED NOT DETECTED Final   Staphylococcus epidermidis NOT DETECTED NOT DETECTED Final   Staphylococcus lugdunensis NOT DETECTED NOT DETECTED Final   Streptococcus species NOT DETECTED NOT DETECTED Final   Streptococcus agalactiae NOT DETECTED NOT DETECTED Final   Streptococcus pneumoniae NOT DETECTED NOT DETECTED Final   Streptococcus pyogenes NOT DETECTED NOT DETECTED Final   A.calcoaceticus-baumannii NOT DETECTED NOT DETECTED Final   Bacteroides fragilis NOT DETECTED NOT DETECTED Final  Enterobacterales NOT DETECTED NOT DETECTED Final   Enterobacter cloacae complex NOT DETECTED NOT DETECTED Final   Escherichia coli NOT DETECTED NOT DETECTED Final   Klebsiella aerogenes NOT DETECTED NOT DETECTED Final   Klebsiella oxytoca NOT DETECTED NOT DETECTED Final   Klebsiella pneumoniae NOT DETECTED NOT DETECTED Final   Proteus species NOT DETECTED NOT DETECTED Final   Salmonella species NOT DETECTED NOT DETECTED Final   Serratia marcescens NOT DETECTED NOT DETECTED Final   Haemophilus influenzae NOT DETECTED NOT DETECTED Final   Neisseria meningitidis NOT DETECTED NOT DETECTED Final   Pseudomonas aeruginosa NOT DETECTED NOT DETECTED Final   Stenotrophomonas maltophilia NOT DETECTED NOT DETECTED Final   Candida albicans NOT DETECTED NOT DETECTED Final   Candida auris NOT DETECTED NOT DETECTED Final   Candida glabrata NOT DETECTED NOT DETECTED Final   Candida krusei NOT DETECTED NOT DETECTED Final   Candida parapsilosis NOT DETECTED NOT DETECTED Final   Candida tropicalis NOT DETECTED NOT DETECTED Final   Cryptococcus neoformans/gattii NOT DETECTED NOT DETECTED Final    Comment: Performed at [facility], [place] [address], [place], [place] [address]  Blood Culture  (routine x 2)     Status: None   Collection Time: 03/08/21  1:12 PM   Specimen: BLOOD LEFT HAND  Result Value Ref Range Status   Specimen Description   Final    BLOOD LEFT HAND Performed at [facility] [address] [place], [place] [address]    Special Requests   Final    BOTTLES DRAWN AEROBIC ONLY Blood Culture adequate volume Performed at [facility] [address], [place], [place] [address]    Culture   Final    NO GROWTH 5 DAYS Performed at [facility], [place] [address] [place], [place] [address]    Report Status 03/13/2021 FINAL  Final  Urine culture     Status: None   Collection Time: 03/08/21  7:33 PM   Specimen: In/Out Cath Urine  Result Value Ref Range Status   Specimen Description   Final    IN/OUT CATH URINE Performed at [facility] [address], [place], [place] [address]    Special Requests   Final    NONE Performed at [facility], [place] [address] [place], [place] [address]    Culture   Final    NO GROWTH Performed at [facility], [place] [address], [place], [place] [address]    Report Status 03/10/2021 FINAL  Final  MRSA PCR Screening     Status: None   Collection Time: 03/13/21  1:59 AM  Result Value Ref Range Status   MRSA by PCR NEGATIVE NEGATIVE Final    Comment:        The GeneXpert MRSA Assay (FDA approved for NASAL specimens only), is one component of a comprehensive MRSA colonization surveillance program. It is not intended to diagnose MRSA infection nor to guide or monitor treatment for MRSA infections. Performed at [facility], [place] [address] [place], [place] [address]      Labs: BNP (last 3 results) Recent Labs    03/09/21 1118  BNP 59.2   Basic Metabolic Panel: Recent Labs  Lab 03/09/21 0602 03/10/21 0446 03/14/21 0500 03/15/21 0520  NA 143 144 141 144  K 4.1 4.0 6.1* 4.3  CL 109 111 104 105  CO2 _0 GLUCOSE 158* 167* 188* 174*  BUN 43*  36* 19 24*  CREATININE 1.30* 1.00 0.75 0.86  CALCIUM 9.1 9.3 9.0 9.3   Liver Function Tests: Recent Labs  Lab 03/09/21 0602 03/10/21 0446  AST 28 48*  ALT 25 46*  ALKPHOS 60 72  BILITOT 0.8 0.6  PROT 6.4* 6.8  ALBUMIN 2.8* 3.0*   No results for input(s): LIPASE, AMYLASE in the last 168 hours. No results for input(s): AMMONIA in the last 168 hours. CBC: Recent Labs  Lab 03/09/21 0602 03/10/21 0446 03/15/21 0520  WBC 8.6 8.0 7.8  HGB 9.4* 9.0* 9.8*  HCT 33.1* 31.7* 34.1*  MCV 89.0 89.8 86.3  PLT 153 179 312   Cardiac Enzymes: No results for input(s): CKTOTAL, CKMB, CKMBINDEX, TROPONINI in the last 168 hours. BNP: Invalid input(s): POCBNP CBG: Recent Labs  Lab 03/14/21 0732 03/14/21 1133 03/14/21 1631 03/14/21 2140 03/15/21 0751  GLUCAP 183* 207* 152* 130* 174*   D-Dimer No results for input(s): DDIMER in the last 72 hours. Hgb A1c No results for input(s): HGBA1C in the last 72 hours. Lipid Profile No results for input(s): CHOL, HDL, LDLCALC, TRIG, CHOLHDL, LDLDIRECT in the last 72 hours. Thyroid function studies No results for input(s): TSH, T4TOTAL, T3FREE, THYROIDAB in the last 72 hours.  Invalid input(s): FREET3 Anemia work up No results for input(s): VITAMINB12, FOLATE, FERRITIN, TIBC, IRON, RETICCTPCT in the last 72 hours. Urinalysis    Component Value Date/Time   COLORURINE AMBER (A) 03/08/2021 1933   APPEARANCEUR HAZY (A) 03/08/2021 1933   LABSPEC 1.025 03/08/2021 1933   PHURINE 5.0 03/08/2021 1933   GLUCOSEU NEGATIVE 03/08/2021 1933   GLUCOSEU 500 (A) 11/14/2020 1345   HGBUR NEGATIVE 03/08/2021 1933   BILIRUBINUR NEGATIVE 03/08/2021 1933   KETONESUR NEGATIVE 03/08/2021 1933   PROTEINUR NEGATIVE 03/08/2021 1933   UROBILINOGEN 0.2 11/14/2020 1345   NITRITE NEGATIVE 03/08/2021 1933   LEUKOCYTESUR NEGATIVE 03/08/2021 1933   Sepsis Labs Invalid input(s): PROCALCITONIN,  WBC,   LACTICIDVEN Microbiology Recent Results (from the past 240 hour(s))  Resp Panel by RT-PCR (Flu A&B, Covid) Nasopharyngeal Swab     Status: None   Collection Time: 03/08/21  5:17 AM   Specimen: Nasopharyngeal Swab; Nasopharyngeal(NP) swabs in vial transport medium  Result Value Ref Range Status   SARS Coronavirus 2 by RT PCR NEGATIVE NEGATIVE Final    Comment: (NOTE) SARS-CoV-2 target nucleic acids are NOT DETECTED.  The SARS-CoV-2 RNA is generally detectable in upper respiratory specimens during the acute phase of infection. The lowest concentration of SARS-CoV-2 viral copies this assay can detect is 138 copies/mL. A negative result does not preclude SARS-Cov-2 infection and should not be used as the sole basis for treatment or other patient management decisions. A negative result may occur with  improper specimen collection/handling, submission of specimen other than nasopharyngeal swab, presence of viral mutation(s) within the areas targeted by this assay, and inadequate number of viral copies(<138 copies/mL). A negative result must be combined with clinical observations, patient history, and epidemiological information. The expected result is Negative.  Fact Sheet for Patients:  EntrepreneurPulse.com.au  Fact Sheet for Healthcare Providers:  IncredibleEmployment.be  This test is no t yet approved or cleared by the [place] FDA and  has been authorized for detection and/or diagnosis of SARS-CoV-2 by FDA under an Emergency Use Authorization (EUA). This EUA will remain  in effect (meaning this test can be used) for the duration of the COVID-19 declaration under Section 564(b)(1) of the Act, 21 U.S.C.section 360bbb-3(b)(1), unless the authorization is terminated  or  revoked sooner.       Influenza A by PCR NEGATIVE NEGATIVE Final   Influenza B by PCR NEGATIVE NEGATIVE Final    Comment: (NOTE) The Xpert Xpress SARS-CoV-2/FLU/RSV plus  assay is intended as an aid in the diagnosis of influenza from Nasopharyngeal swab specimens and should not be used as a sole basis for treatment. Nasal washings and aspirates are unacceptable for Xpert Xpress SARS-CoV-2/FLU/RSV testing.  Fact Sheet for Patients: EntrepreneurPulse.com.au  Fact Sheet for Healthcare Providers: IncredibleEmployment.be  This test is not yet approved or cleared by the [place] FDA and has been authorized for detection and/or diagnosis of SARS-CoV-2 by FDA under an Emergency Use Authorization (EUA). This EUA will remain in effect (meaning this test can be used) for the duration of the COVID-19 declaration under Section 564(b)(1) of the Act, 21 U.S.C. section 360bbb-3(b)(1), unless the authorization is terminated or revoked.  Performed at [facility], [place] [address], [place], [place] [address]   Blood Culture (routine x 2)     Status: Abnormal   Collection Time: 03/08/21  5:23 AM   Specimen: BLOOD RIGHT HAND  Result Value Ref Range Status   Specimen Description   Final    BLOOD RIGHT HAND Performed at [facility] [address] [place], [place] [address]    Special Requests   Final    BOTTLES DRAWN AEROBIC AND ANAEROBIC Blood Culture results may not be optimal due to an inadequate volume of blood received in culture bottles Performed at [facility] [address], [place], [place] [address]    Culture  Setup Time   Final    GRAM POSITIVE COCCI IN CLUSTERS AEROBIC BOTTLE ONLY CRITICAL RESULT CALLED TO, READ BACK BY AND VERIFIED WITH: Colin Rhein [facility] 2120 03/10/21 A BROWNING    Culture (A)  Final    COAGULASE NEGATIVE STAPHYLOCOCCUS THE SIGNIFICANCE OF ISOLATING THIS ORGANISM FROM A SINGLE SET OF BLOOD CULTURES WHEN MULTIPLE SETS ARE DRAWN IS UNCERTAIN. PLEASE NOTIFY THE MICROBIOLOGY DEPARTMENT WITHIN ONE WEEK IF SPECIATION AND SENSITIVITIES ARE  REQUIRED. Performed at [facility], [place] [address] [place],  [address]    Report Status 03/12/2021 FINAL  Final  Blood Culture ID Panel (Reflexed)     Status: Abnormal   Collection Time: 03/08/21  5:23 AM  Result Value Ref Range Status   Enterococcus faecalis NOT DETECTED NOT DETECTED Final   Enterococcus Faecium NOT DETECTED NOT DETECTED Final   Listeria monocytogenes NOT DETECTED NOT DETECTED Final   Staphylococcus species DETECTED (A) NOT DETECTED Final    Comment: CRITICAL RESULT CALLED TO, READ BACK BY AND VERIFIED WITH: M BELL PHARMD 2120 03/10/21 A BROWNING    Staphylococcus aureus (BCID) NOT DETECTED NOT DETECTED Final   Staphylococcus epidermidis NOT DETECTED NOT DETECTED Final   Staphylococcus lugdunensis NOT DETECTED NOT DETECTED Final   Streptococcus species NOT DETECTED NOT DETECTED Final   Streptococcus agalactiae NOT DETECTED NOT DETECTED Final   Streptococcus pneumoniae NOT DETECTED NOT DETECTED Final   Streptococcus pyogenes NOT DETECTED NOT DETECTED Final   A.calcoaceticus-baumannii NOT DETECTED NOT DETECTED Final   Bacteroides fragilis NOT DETECTED NOT DETECTED Final   Enterobacterales NOT DETECTED NOT DETECTED Final   Enterobacter cloacae complex NOT DETECTED NOT DETECTED Final   Escherichia coli NOT DETECTED NOT DETECTED Final   Klebsiella aerogenes NOT DETECTED NOT DETECTED Final   Klebsiella oxytoca NOT DETECTED NOT DETECTED Final   Klebsiella pneumoniae NOT DETECTED NOT DETECTED Final  Proteus species NOT DETECTED NOT DETECTED Final   Salmonella species NOT DETECTED NOT DETECTED Final   Serratia marcescens NOT DETECTED NOT DETECTED Final   Haemophilus influenzae NOT DETECTED NOT DETECTED Final   Neisseria meningitidis NOT DETECTED NOT DETECTED Final   Pseudomonas aeruginosa NOT DETECTED NOT DETECTED Final   Stenotrophomonas maltophilia NOT DETECTED NOT DETECTED Final   Candida albicans NOT DETECTED NOT DETECTED Final   Candida auris NOT  DETECTED NOT DETECTED Final   Candida glabrata NOT DETECTED NOT DETECTED Final   Candida krusei NOT DETECTED NOT DETECTED Final   Candida parapsilosis NOT DETECTED NOT DETECTED Final   Candida tropicalis NOT DETECTED NOT DETECTED Final   Cryptococcus neoformans/gattii NOT DETECTED NOT DETECTED Final    Comment: Performed at [facility], [address], [place], [place] [address]  Blood Culture (routine x 2)     Status: None   Collection Time: 03/08/21  1:12 PM   Specimen: BLOOD LEFT HAND  Result Value Ref Range Status   Specimen Description   Final    BLOOD LEFT HAND Performed at [facility] [address], [place], [name] [address]    Special Requests   Final    BOTTLES DRAWN AEROBIC ONLY Blood Culture adequate volume Performed at [facility] [address], [place], [place] [address]    Culture   Final    NO GROWTH 5 DAYS Performed at [facility], [place] [address], [place], [place] [address]    Report Status 03/13/2021 FINAL  Final  Urine culture     Status: None   Collection Time: 03/08/21  7:33 PM   Specimen: In/Out Cath Urine  Result Value Ref Range Status   Specimen Description   Final    IN/OUT CATH URINE Performed at [facility] [address] [place], [place] [address]    Special Requests   Final    NONE Performed at [facility], [place] [address], [place], [place] [address]    Culture   Final    NO GROWTH Performed at [facility], [place] [address], [place], [place] [address]    Report Status 03/10/2021 FINAL  Final  MRSA PCR Screening     Status: None   Collection Time: 03/13/21  1:59 AM  Result Value Ref Range Status   MRSA by PCR NEGATIVE NEGATIVE Final    Comment:        The GeneXpert MRSA Assay (FDA approved for NASAL specimens only), is one component of a comprehensive MRSA colonization surveillance program. It is not intended to diagnose MRSA infection  nor to guide or monitor treatment for MRSA infections. Performed at [facility], [place] [address], [place], [place] [address]      Time coordinating discharge: 35 minutes  SIGNED:   Aline August, MD  [facility] 03/15/2021, 10:53 AM

## 2021-03-16 DIAGNOSIS — M6281 Muscle weakness (generalized): Secondary | ICD-10-CM | POA: Diagnosis not present

## 2021-03-16 DIAGNOSIS — M24541 Contracture, right hand: Secondary | ICD-10-CM | POA: Diagnosis not present

## 2021-03-16 DIAGNOSIS — I69351 Hemiplegia and hemiparesis following cerebral infarction affecting right dominant side: Secondary | ICD-10-CM | POA: Diagnosis not present

## 2021-03-16 DIAGNOSIS — I69391 Dysphagia following cerebral infarction: Secondary | ICD-10-CM | POA: Diagnosis not present

## 2021-03-16 DIAGNOSIS — R293 Abnormal posture: Secondary | ICD-10-CM | POA: Diagnosis not present

## 2021-03-16 DIAGNOSIS — G8191 Hemiplegia, unspecified affecting right dominant side: Secondary | ICD-10-CM | POA: Diagnosis not present

## 2021-03-17 DIAGNOSIS — I69351 Hemiplegia and hemiparesis following cerebral infarction affecting right dominant side: Secondary | ICD-10-CM | POA: Diagnosis not present

## 2021-03-17 DIAGNOSIS — M24541 Contracture, right hand: Secondary | ICD-10-CM | POA: Diagnosis not present

## 2021-03-17 DIAGNOSIS — R293 Abnormal posture: Secondary | ICD-10-CM | POA: Diagnosis not present

## 2021-03-20 DIAGNOSIS — R293 Abnormal posture: Secondary | ICD-10-CM | POA: Diagnosis not present

## 2021-03-20 DIAGNOSIS — M24541 Contracture, right hand: Secondary | ICD-10-CM | POA: Diagnosis not present

## 2021-03-20 DIAGNOSIS — I69351 Hemiplegia and hemiparesis following cerebral infarction affecting right dominant side: Secondary | ICD-10-CM | POA: Diagnosis not present

## 2021-03-21 ENCOUNTER — Non-Acute Institutional Stay: Payer: Medicare HMO | Admitting: Hospice

## 2021-03-21 ENCOUNTER — Telehealth: Payer: Self-pay | Admitting: Hospice

## 2021-03-21 ENCOUNTER — Other Ambulatory Visit: Payer: Self-pay

## 2021-03-21 DIAGNOSIS — I4891 Unspecified atrial fibrillation: Secondary | ICD-10-CM

## 2021-03-21 DIAGNOSIS — I63522 Cerebral infarction due to unspecified occlusion or stenosis of left anterior cerebral artery: Secondary | ICD-10-CM

## 2021-03-21 DIAGNOSIS — I69351 Hemiplegia and hemiparesis following cerebral infarction affecting right dominant side: Secondary | ICD-10-CM

## 2021-03-21 DIAGNOSIS — Z515 Encounter for palliative care: Secondary | ICD-10-CM | POA: Diagnosis not present

## 2021-03-21 DIAGNOSIS — J690 Pneumonitis due to inhalation of food and vomit: Secondary | ICD-10-CM | POA: Diagnosis not present

## 2021-03-21 DIAGNOSIS — M24541 Contracture, right hand: Secondary | ICD-10-CM | POA: Diagnosis not present

## 2021-03-21 DIAGNOSIS — R293 Abnormal posture: Secondary | ICD-10-CM | POA: Diagnosis not present

## 2021-03-21 NOTE — Progress Notes (Signed)
[facility] Consult Note Telephone: [phone]  Fax: [phone]  PATIENT NAME: Brian [name] [id number] [phone] (home)  DOB: 12-23-1935 MRN: [id number]  PRIMARY CARE PROVIDER:    [name], MD,  [place] [address] [phone]  REFERRING PROVIDER:   Dr. Jules Husbands    RESPONSIBLE PARTY:  Brian [name] - son Mobile: [phone] Contact Information     Name Relation Home Work Mobile   Martinezlopez,hugh Son   [phone]   ARREN, LAMINACK Kiana, Ennis Forts Daughter   [phone]        I met face to face with patient at facility. Palliative Care was asked to follow this patient by consultation request of  Dr. Jules Husbands  to address advance care planning, complex medical decision making and goals of care clarification.  NP called son and left him a voicemail with callback number.  This is the initial visit.    ASSESSMENT AND / RECOMMENDATIONS:   Advance Care Planning: Our advance care planning conversation included a discussion about:    The value and importance of advance care planning  Difference between Hospice and Palliative care Exploration of goals of care in the event of a sudden injury or illness  Identification and preparation of a healthcare agent  Review and updating or creation of an  advance directive document .   CODE STATUS: Patient is a full code  Goals of Care: Goals include to maximize quality of life and symptom management  I spent 20 minutes providing this initial consultation. More than 50% of the time in this consultation was spent on counseling patient and coordinating communication. --------------------------------------------------------------------------------------------------------------------------------------  Symptom Management/Plan: R dominant side hemiplegia/hemiparesis: Following recent stroke. Able  to sit up by the side of the bed per report from PTA working with patient. PT/OT/ST is ongoing Aspiration pneumonia: Completed antibiotics as ordered.  Dysphagia: Nourishment via peg tube Jevity 1.5 77m/hr. Aspiration precautions discussed with nursing. ST following Afib: Continue Eliquis as ordered.  Routine CBC BMP  Follow up: Palliative care will continue to follow for complex medical decision making, advance care planning, and clarification of goals. Return 6 weeks or prn.Encouraged to call provider sooner with any concerns.   Family /Caregiver/Community Supports: Patient in SNF for ongoing care  HOSPICE ELIGIBILITY/DIAGNOSIS: TBD  Chief Complaint: Initial Palliative care visit  HISTORY OF PRESENT ILLNESS:  Brian JUHASZis a 85y.o. year old male  with multiple medical conditions including R dominant side hemiplegia/hemiparesis following recent stroke for which he was hospitalized 5/9- 02/28/2021 discharged to SNF for acute rehab; patient is now nonambulatory and this impairs his independence and activities of daily living.  Ongoing physical therapy and Occupational Therapy is helpful.  He went back to the hospital for aspiration pneumonia 6/22- 03/15/2021. Antibiotics is completed and patient on Tussin and oxygen supplementation.  Patient is a poor historian, likely due to recent stroke. History atrial fibrillation on Eliquis, uncontrolled diabetes mellitus type 2, essential hypertension, Type 2 DM. History obtained from review of EMR, discussion with primary team, caregiver, family and/or Brian [name].  Review and summarization of Epic records shows history from other than patient. Rest of 10 point ROS asked and negative.     Review of lab tests/diagnostics   Recent Labs  Lab 03/15/21 0520  WBC 7.8  HGB 9.8*  HCT 34.1*  PLT 312  MCV 86.3   Recent Labs  Lab 03/15/21 0520  NA 144  K 4.3  CL 105  CO2 29  BUN 24*  CREATININE 0.86  GLUCOSE 174*     Physical  Exam: Height/Weight Constitutional: NAD General: Well groomed, in no acute distress, limited speech EYES: anicteric sclera, lids intact, no discharge  ENMT: Moist mucous membrane CV: S1 S2, RRR, no LE edema Pulmonary: LCTA, no increased work of breathing, no cough, Abdomen: active BS + 4 quadrants, soft and non tender GU: no suprapubic tenderness MSK: Right-sided weakness, limited ROM Skin: warm and dry, no rashes or wounds on visible skin Neuro:  weakness, otherwise non focal,  Psych: non-anxious affect Hem/lymph/immuno: no widespread bruising   PAST MEDICAL HISTORY:  Active Ambulatory Problems    Diagnosis Date Noted   Mixed hyperlipidemia 03/31/2007   Overweight (BMI 25.0-29.9) 03/31/2007   ANEMIA, CHRONIC DISEASE NEC 03/31/2007   ALLERGIC RHINITIS 10/13/2007   POLYP, ANAL AND RECTAL 03/31/2007   BENIGN PROSTATIC HYPERTROPHY 03/31/2007   SKIN LESION 03/01/2008   [place] DISEASE, LUMBAR 03/31/2007   Nephrolithiasis 12/07/2010   Hypertensive crisis 12/07/2010   Encounter for well adult exam with abnormal findings 06/10/2011   Fatigue 06/14/2011   Ejection fraction    Erectile dysfunction 12/13/2011   Abdominal pain, other specified site 06/23/2012   Allergic conjunctivitis 01/12/2016   Poorly controlled type 2 diabetes mellitus with circulatory disorder ([facility]) 10/02/2016   Right leg pain 12/04/2017   Grief 12/04/2017   Cerebral embolism with cerebral infarction 04/02/2018   Acute CVA (cerebrovascular accident) ([facility]) 04/09/2018   History of CVA (cerebrovascular accident)    PAF (paroxysmal atrial fibrillation) (H[facility])    Diastolic dysfunction    Acute ischemic left ACA stroke ([place]) 06/23/2018   Insomnia 01/31/2019   Right lumbar radiculopathy 10/20/2019   Bee sting reaction 07/10/2020   Vitamin D deficiency 07/10/2020   Skin lesion of right leg 11/14/2020   Uncontrolled type 2 diabetes mellitus with circulatory disorder causing erectile dysfunction ([facility]) 11/14/2020   ICH  (intracerebral hemorrhage) ([place]) 01/23/2021   SVT (supraventricular tachycardia) (HCC)    Pressure injury of skin 02/06/2021   Aspiration pneumonia ([facility]) 03/08/2021   Resolved Ambulatory Problems    Diagnosis Date Noted   ANEMIA-NOS 10/13/2007   HYPERTENSION 03/31/2007   FATIGUE 12/22/2008   Chest pain    Acute bronchitis 05/12/2012   Rash 06/23/2012   Acute upper respiratory infections of unspecified site 03/02/2013   Altered mental status 04/01/2018   Acute renal failure (ARF) ([facility]) 04/01/2018   CVA (cerebral vascular accident) ([facility]) 06/22/2018   Past Medical History:  Diagnosis Date   Chronic anemia    DIABETES MELLITUS, TYPE II 03/31/2007   ED (erectile dysfunction)    History of prostatitis    HYPERLIPIDEMIA 03/31/2007   Obesity    Right eye trauma    Stroke [facility])     SOCIAL HX:  Social History   Tobacco Use   Smoking status: Former    Years: 10.00    Pack years: 0.00    Types: Cigarettes    Quit date: 1995    Years since quitting: 27.5   Smokeless tobacco: Never  Substance Use Topics   Alcohol use: Yes    Alcohol/week: 1.0 standard drink    Types: 1 Cans of beer per week    Comment: occasional or yearly     FAMILY HX:  Family History  Problem Relation Age of Onset   Colon polyps Mother    Stroke Mother  due to ICA stenosis   CVA Neg Hx    Seizures Neg Hx       ALLERGIES: No Known Allergies    PERTINENT MEDICATIONS:  Outpatient Encounter Medications as of 03/21/2021  Medication Sig   acetaminophen (TYLENOL) 650 MG CR tablet Take 650 mg by mouth every 6 (six) hours as needed for pain.   amiodarone (PACERONE) 200 MG tablet Place 1 tablet (200 mg total) into feeding tube daily.   amLODipine (NORVASC) 10 MG tablet Place 1 tablet (10 mg total) into feeding tube daily.   atorvastatin (LIPITOR) 80 MG tablet Place 1 tablet (80 mg total) into feeding tube every evening.   Blood Glucose Monitoring Suppl (ONETOUCH VERIO) w/Device KIT Use as directed  twice daily E11.9   carvedilol (COREG) 12.5 MG tablet Place 1 tablet (12.5 mg total) into feeding tube 2 (two) times daily with a meal.   Dextran 70-Hypromellose (LUBRICATING TEARS EYE DROPS OP) Place 1 drop into both eyes at bedtime as needed (dry eyes).   gabapentin (NEURONTIN) 100 MG capsule Take 1 capsule (100 mg total) by mouth 2 (two) times daily.   guaifenesin (ROBITUSSIN) 100 MG/5ML syrup Place 200 mg into feeding tube 2 (two) times daily.   insulin detemir (LEVEMIR) 100 UNIT/ML injection Inject 0.1 mLs (10 Units total) into the skin 2 (two) times daily.   insulin lispro (HUMALOG) 100 UNIT/ML injection Inject 0-14 Units into the skin See admin instructions. Sliding scale as follows: Check blood sugar three times daily. If blood sugar is <200=0u, 201-250=2u, 251-300=4u, 301-350=6u, 351-400=8u, 401-450=10u, 451-500=12u, if reads HIGH give 14u, recheck in 2 hours, and if blood sugar is >400  or <100 call P[facility] (Do not change times)   Insulin Pen Needle 32G X 4 MM MISC Use 1x a day   Lancets MISC Use as directed twice daily E11.9   metFORMIN (GLUCOPHAGE) 500 MG tablet Place 500 mg into feeding tube 2 (two) times daily with a meal.   Nutritional Supplements (FEEDING SUPPLEMENT, JEVITY 1.5 CAL,) LIQD Place 30 mLs into feeding tube continuous.   ONETOUCH VERIO test strip USE AS DIRECTED TWICE DAILY E11.9   sitaGLIPtin (JANUVIA) 50 MG tablet Place 50 mg into feeding tube daily.   Water For Irrigation, Sterile (FREE WATER) SOLN Place 100 mLs into feeding tube every 4 (four) hours.   No facility-administered encounter medications on file as of 03/21/2021.     Thank you for the opportunity to participate in the care of Mr. Gabay.  The palliative care team will continue to follow. Please call our office at [phone] if we can be of additional assistance.   Note: Portions of this note were generated with L[employer]. Dictation errors may occur despite best attempts at  proofreading.  Brian Spray, NP

## 2021-03-22 DIAGNOSIS — I4891 Unspecified atrial fibrillation: Secondary | ICD-10-CM | POA: Diagnosis not present

## 2021-03-22 DIAGNOSIS — E1165 Type 2 diabetes mellitus with hyperglycemia: Secondary | ICD-10-CM | POA: Diagnosis not present

## 2021-03-22 DIAGNOSIS — I69391 Dysphagia following cerebral infarction: Secondary | ICD-10-CM | POA: Diagnosis not present

## 2021-03-22 DIAGNOSIS — I69351 Hemiplegia and hemiparesis following cerebral infarction affecting right dominant side: Secondary | ICD-10-CM | POA: Diagnosis not present

## 2021-03-22 DIAGNOSIS — R293 Abnormal posture: Secondary | ICD-10-CM | POA: Diagnosis not present

## 2021-03-22 DIAGNOSIS — J690 Pneumonitis due to inhalation of food and vomit: Secondary | ICD-10-CM | POA: Diagnosis not present

## 2021-03-22 DIAGNOSIS — I639 Cerebral infarction, unspecified: Secondary | ICD-10-CM | POA: Diagnosis not present

## 2021-03-22 DIAGNOSIS — J9601 Acute respiratory failure with hypoxia: Secondary | ICD-10-CM | POA: Diagnosis not present

## 2021-03-22 DIAGNOSIS — I10 Essential (primary) hypertension: Secondary | ICD-10-CM | POA: Diagnosis not present

## 2021-03-22 DIAGNOSIS — D649 Anemia, unspecified: Secondary | ICD-10-CM | POA: Diagnosis not present

## 2021-03-22 DIAGNOSIS — M24541 Contracture, right hand: Secondary | ICD-10-CM | POA: Diagnosis not present

## 2021-03-22 DIAGNOSIS — G8191 Hemiplegia, unspecified affecting right dominant side: Secondary | ICD-10-CM | POA: Diagnosis not present

## 2021-03-22 DIAGNOSIS — R4701 Aphasia: Secondary | ICD-10-CM | POA: Diagnosis not present

## 2021-03-22 NOTE — Telephone Encounter (Signed)
Brian [name] called back and was updated on visit. He endorsed palliative service, said he will update his sister and call NP in the future as needed.

## 2021-03-23 DIAGNOSIS — I69351 Hemiplegia and hemiparesis following cerebral infarction affecting right dominant side: Secondary | ICD-10-CM | POA: Diagnosis not present

## 2021-03-23 DIAGNOSIS — M24541 Contracture, right hand: Secondary | ICD-10-CM | POA: Diagnosis not present

## 2021-03-23 DIAGNOSIS — R293 Abnormal posture: Secondary | ICD-10-CM | POA: Diagnosis not present

## 2021-03-24 DIAGNOSIS — I69351 Hemiplegia and hemiparesis following cerebral infarction affecting right dominant side: Secondary | ICD-10-CM | POA: Diagnosis not present

## 2021-03-24 DIAGNOSIS — M24541 Contracture, right hand: Secondary | ICD-10-CM | POA: Diagnosis not present

## 2021-03-24 DIAGNOSIS — R293 Abnormal posture: Secondary | ICD-10-CM | POA: Diagnosis not present

## 2021-03-27 DIAGNOSIS — I69351 Hemiplegia and hemiparesis following cerebral infarction affecting right dominant side: Secondary | ICD-10-CM | POA: Diagnosis not present

## 2021-03-27 DIAGNOSIS — M24541 Contracture, right hand: Secondary | ICD-10-CM | POA: Diagnosis not present

## 2021-03-27 DIAGNOSIS — R293 Abnormal posture: Secondary | ICD-10-CM | POA: Diagnosis not present

## 2021-03-28 DIAGNOSIS — M24541 Contracture, right hand: Secondary | ICD-10-CM | POA: Diagnosis not present

## 2021-03-28 DIAGNOSIS — I69351 Hemiplegia and hemiparesis following cerebral infarction affecting right dominant side: Secondary | ICD-10-CM | POA: Diagnosis not present

## 2021-03-28 DIAGNOSIS — R293 Abnormal posture: Secondary | ICD-10-CM | POA: Diagnosis not present

## 2021-03-29 DIAGNOSIS — R293 Abnormal posture: Secondary | ICD-10-CM | POA: Diagnosis not present

## 2021-03-29 DIAGNOSIS — I69351 Hemiplegia and hemiparesis following cerebral infarction affecting right dominant side: Secondary | ICD-10-CM | POA: Diagnosis not present

## 2021-03-29 DIAGNOSIS — D500 Iron deficiency anemia secondary to blood loss (chronic): Secondary | ICD-10-CM | POA: Diagnosis not present

## 2021-03-29 DIAGNOSIS — M24541 Contracture, right hand: Secondary | ICD-10-CM | POA: Diagnosis not present

## 2021-03-30 DIAGNOSIS — R293 Abnormal posture: Secondary | ICD-10-CM | POA: Diagnosis not present

## 2021-03-30 DIAGNOSIS — I69351 Hemiplegia and hemiparesis following cerebral infarction affecting right dominant side: Secondary | ICD-10-CM | POA: Diagnosis not present

## 2021-03-30 DIAGNOSIS — M24541 Contracture, right hand: Secondary | ICD-10-CM | POA: Diagnosis not present

## 2021-03-31 DIAGNOSIS — R293 Abnormal posture: Secondary | ICD-10-CM | POA: Diagnosis not present

## 2021-03-31 DIAGNOSIS — I629 Nontraumatic intracranial hemorrhage, unspecified: Secondary | ICD-10-CM | POA: Diagnosis not present

## 2021-03-31 DIAGNOSIS — M24541 Contracture, right hand: Secondary | ICD-10-CM | POA: Diagnosis not present

## 2021-03-31 DIAGNOSIS — I69920 Aphasia following unspecified cerebrovascular disease: Secondary | ICD-10-CM | POA: Diagnosis not present

## 2021-03-31 DIAGNOSIS — I69351 Hemiplegia and hemiparesis following cerebral infarction affecting right dominant side: Secondary | ICD-10-CM | POA: Diagnosis not present

## 2021-03-31 DIAGNOSIS — D500 Iron deficiency anemia secondary to blood loss (chronic): Secondary | ICD-10-CM | POA: Diagnosis not present

## 2021-03-31 DIAGNOSIS — R319 Hematuria, unspecified: Secondary | ICD-10-CM | POA: Diagnosis not present

## 2021-04-01 ENCOUNTER — Inpatient Hospital Stay (HOSPITAL_COMMUNITY): Payer: Medicare HMO

## 2021-04-01 ENCOUNTER — Emergency Department (HOSPITAL_COMMUNITY): Payer: Medicare HMO

## 2021-04-01 ENCOUNTER — Encounter (HOSPITAL_COMMUNITY): Payer: Self-pay | Admitting: Student

## 2021-04-01 ENCOUNTER — Ambulatory Visit (HOSPITAL_COMMUNITY): Payer: Medicare HMO

## 2021-04-01 ENCOUNTER — Inpatient Hospital Stay (HOSPITAL_COMMUNITY)
Admission: EM | Admit: 2021-04-01 | Discharge: 2021-04-17 | DRG: 871 | Disposition: E | Payer: Medicare HMO | Attending: Specialist | Admitting: Specialist

## 2021-04-01 DIAGNOSIS — I619 Nontraumatic intracerebral hemorrhage, unspecified: Secondary | ICD-10-CM | POA: Diagnosis present

## 2021-04-01 DIAGNOSIS — Z6830 Body mass index (BMI) 30.0-30.9, adult: Secondary | ICD-10-CM | POA: Diagnosis not present

## 2021-04-01 DIAGNOSIS — Z8673 Personal history of transient ischemic attack (TIA), and cerebral infarction without residual deficits: Secondary | ICD-10-CM

## 2021-04-01 DIAGNOSIS — Z87442 Personal history of urinary calculi: Secondary | ICD-10-CM | POA: Diagnosis not present

## 2021-04-01 DIAGNOSIS — Z823 Family history of stroke: Secondary | ICD-10-CM

## 2021-04-01 DIAGNOSIS — R0902 Hypoxemia: Secondary | ICD-10-CM | POA: Diagnosis not present

## 2021-04-01 DIAGNOSIS — I69391 Dysphagia following cerebral infarction: Secondary | ICD-10-CM | POA: Diagnosis not present

## 2021-04-01 DIAGNOSIS — I5189 Other ill-defined heart diseases: Secondary | ICD-10-CM

## 2021-04-01 DIAGNOSIS — I480 Paroxysmal atrial fibrillation: Secondary | ICD-10-CM | POA: Diagnosis present

## 2021-04-01 DIAGNOSIS — I10 Essential (primary) hypertension: Secondary | ICD-10-CM | POA: Diagnosis present

## 2021-04-01 DIAGNOSIS — E669 Obesity, unspecified: Secondary | ICD-10-CM | POA: Diagnosis present

## 2021-04-01 DIAGNOSIS — D638 Anemia in other chronic diseases classified elsewhere: Secondary | ICD-10-CM | POA: Diagnosis not present

## 2021-04-01 DIAGNOSIS — R6521 Severe sepsis with septic shock: Secondary | ICD-10-CM

## 2021-04-01 DIAGNOSIS — A419 Sepsis, unspecified organism: Secondary | ICD-10-CM | POA: Diagnosis not present

## 2021-04-01 DIAGNOSIS — I69351 Hemiplegia and hemiparesis following cerebral infarction affecting right dominant side: Secondary | ICD-10-CM | POA: Diagnosis not present

## 2021-04-01 DIAGNOSIS — J9600 Acute respiratory failure, unspecified whether with hypoxia or hypercapnia: Secondary | ICD-10-CM | POA: Diagnosis not present

## 2021-04-01 DIAGNOSIS — N179 Acute kidney failure, unspecified: Secondary | ICD-10-CM | POA: Diagnosis present

## 2021-04-01 DIAGNOSIS — Z8371 Family history of colonic polyps: Secondary | ICD-10-CM | POA: Diagnosis not present

## 2021-04-01 DIAGNOSIS — Z452 Encounter for adjustment and management of vascular access device: Secondary | ICD-10-CM | POA: Diagnosis not present

## 2021-04-01 DIAGNOSIS — N400 Enlarged prostate without lower urinary tract symptoms: Secondary | ICD-10-CM | POA: Diagnosis present

## 2021-04-01 DIAGNOSIS — R404 Transient alteration of awareness: Secondary | ICD-10-CM | POA: Diagnosis not present

## 2021-04-01 DIAGNOSIS — Z20822 Contact with and (suspected) exposure to covid-19: Secondary | ICD-10-CM | POA: Diagnosis present

## 2021-04-01 DIAGNOSIS — I469 Cardiac arrest, cause unspecified: Secondary | ICD-10-CM

## 2021-04-01 DIAGNOSIS — J690 Pneumonitis due to inhalation of food and vomit: Secondary | ICD-10-CM | POA: Diagnosis present

## 2021-04-01 DIAGNOSIS — E782 Mixed hyperlipidemia: Secondary | ICD-10-CM | POA: Diagnosis present

## 2021-04-01 DIAGNOSIS — Z87891 Personal history of nicotine dependence: Secondary | ICD-10-CM | POA: Diagnosis not present

## 2021-04-01 DIAGNOSIS — R739 Hyperglycemia, unspecified: Secondary | ICD-10-CM | POA: Diagnosis not present

## 2021-04-01 DIAGNOSIS — Z743 Need for continuous supervision: Secondary | ICD-10-CM | POA: Diagnosis not present

## 2021-04-01 DIAGNOSIS — R000 Tachycardia, unspecified: Secondary | ICD-10-CM | POA: Diagnosis not present

## 2021-04-01 DIAGNOSIS — I700 Atherosclerosis of aorta: Secondary | ICD-10-CM | POA: Diagnosis not present

## 2021-04-01 DIAGNOSIS — J189 Pneumonia, unspecified organism: Secondary | ICD-10-CM

## 2021-04-01 DIAGNOSIS — Z79899 Other long term (current) drug therapy: Secondary | ICD-10-CM

## 2021-04-01 DIAGNOSIS — E1159 Type 2 diabetes mellitus with other circulatory complications: Secondary | ICD-10-CM | POA: Diagnosis present

## 2021-04-01 DIAGNOSIS — Z931 Gastrostomy status: Secondary | ICD-10-CM

## 2021-04-01 DIAGNOSIS — E663 Overweight: Secondary | ICD-10-CM | POA: Diagnosis present

## 2021-04-01 DIAGNOSIS — I482 Chronic atrial fibrillation, unspecified: Secondary | ICD-10-CM | POA: Diagnosis present

## 2021-04-01 DIAGNOSIS — J9601 Acute respiratory failure with hypoxia: Secondary | ICD-10-CM

## 2021-04-01 DIAGNOSIS — R1310 Dysphagia, unspecified: Secondary | ICD-10-CM

## 2021-04-01 DIAGNOSIS — Z794 Long term (current) use of insulin: Secondary | ICD-10-CM

## 2021-04-01 DIAGNOSIS — Z4682 Encounter for fitting and adjustment of non-vascular catheter: Secondary | ICD-10-CM | POA: Diagnosis not present

## 2021-04-01 DIAGNOSIS — E1165 Type 2 diabetes mellitus with hyperglycemia: Secondary | ICD-10-CM | POA: Diagnosis present

## 2021-04-01 DIAGNOSIS — J984 Other disorders of lung: Secondary | ICD-10-CM | POA: Diagnosis not present

## 2021-04-01 DIAGNOSIS — Z7984 Long term (current) use of oral hypoglycemic drugs: Secondary | ICD-10-CM

## 2021-04-01 LAB — RESP PANEL BY RT-PCR (FLU A&B, COVID) ARPGX2
Influenza A by PCR: NEGATIVE
Influenza B by PCR: NEGATIVE
SARS Coronavirus 2 by RT PCR: NEGATIVE

## 2021-04-01 LAB — COMPREHENSIVE METABOLIC PANEL
ALT: 23 U/L (ref 0–44)
AST: 31 U/L (ref 15–41)
Albumin: 2.5 g/dL — ABNORMAL LOW (ref 3.5–5.0)
Alkaline Phosphatase: 60 U/L (ref 38–126)
Anion gap: 10 (ref 5–15)
BUN: 57 mg/dL — ABNORMAL HIGH (ref 8–23)
CO2: 24 mmol/L (ref 22–32)
Calcium: 9 mg/dL (ref 8.9–10.3)
Chloride: 102 mmol/L (ref 98–111)
Creatinine, Ser: 1.65 mg/dL — ABNORMAL HIGH (ref 0.61–1.24)
GFR, Estimated: 41 mL/min — ABNORMAL LOW (ref >60)
Glucose, Bld: 202 mg/dL — ABNORMAL HIGH (ref 70–99)
Potassium: 4.7 mmol/L (ref 3.5–5.1)
Sodium: 136 mmol/L (ref 135–145)
Total Bilirubin: 1.3 mg/dL — ABNORMAL HIGH (ref 0.3–1.2)
Total Protein: 6.6 g/dL (ref 6.5–8.1)

## 2021-04-01 LAB — CBC WITH DIFFERENTIAL/PLATELET
Abs Immature Granulocytes: 0.02 10*3/uL (ref 0.00–0.07)
Basophils Absolute: 0 10*3/uL (ref 0.0–0.1)
Basophils Relative: 0 %
Eosinophils Absolute: 0 10*3/uL (ref 0.0–0.5)
Eosinophils Relative: 0 %
HCT: 29.5 % — ABNORMAL LOW (ref 39.0–52.0)
Hemoglobin: 8.5 g/dL — ABNORMAL LOW (ref 13.0–17.0)
Immature Granulocytes: 0 %
Lymphocytes Relative: 12 %
Lymphs Abs: 0.5 10*3/uL — ABNORMAL LOW (ref 0.7–4.0)
MCH: 23.9 pg — ABNORMAL LOW (ref 26.0–34.0)
MCHC: 28.8 g/dL — ABNORMAL LOW (ref 30.0–36.0)
MCV: 82.9 fL (ref 80.0–100.0)
Monocytes Absolute: 0.2 10*3/uL (ref 0.1–1.0)
Monocytes Relative: 5 %
Neutro Abs: 3.7 10*3/uL (ref 1.7–7.7)
Neutrophils Relative %: 83 %
Platelets: 252 10*3/uL (ref 150–400)
RBC: 3.56 MIL/uL — ABNORMAL LOW (ref 4.22–5.81)
RDW: 17.4 % — ABNORMAL HIGH (ref 11.5–15.5)
WBC: 4.5 10*3/uL (ref 4.0–10.5)
nRBC: 0.4 % — ABNORMAL HIGH (ref 0.0–0.2)

## 2021-04-01 LAB — BASIC METABOLIC PANEL
Anion gap: 13 (ref 5–15)
BUN: 52 mg/dL — ABNORMAL HIGH (ref 8–23)
CO2: 26 mmol/L (ref 22–32)
Calcium: 9 mg/dL (ref 8.9–10.3)
Chloride: 101 mmol/L (ref 98–111)
Creatinine, Ser: 1.99 mg/dL — ABNORMAL HIGH (ref 0.61–1.24)
GFR, Estimated: 33 mL/min — ABNORMAL LOW (ref >60)
Glucose, Bld: 131 mg/dL — ABNORMAL HIGH (ref 70–99)
Potassium: 4.8 mmol/L (ref 3.5–5.1)
Sodium: 140 mmol/L (ref 135–145)

## 2021-04-01 LAB — PROTIME-INR
INR: 1.4 — ABNORMAL HIGH (ref 0.8–1.2)
Prothrombin Time: 16.8 seconds — ABNORMAL HIGH (ref 11.4–15.2)

## 2021-04-01 LAB — I-STAT ARTERIAL BLOOD GAS, ED
Acid-Base Excess: 6 mmol/L — ABNORMAL HIGH (ref 0.0–2.0)
Bicarbonate: 30 mmol/L — ABNORMAL HIGH (ref 20.0–28.0)
Calcium, Ion: 1.24 mmol/L (ref 1.15–1.40)
HCT: 28 % — ABNORMAL LOW (ref 39.0–52.0)
Hemoglobin: 9.5 g/dL — ABNORMAL LOW (ref 13.0–17.0)
O2 Saturation: 100 %
Patient temperature: 102.4
Potassium: 4.6 mmol/L (ref 3.5–5.1)
Sodium: 136 mmol/L (ref 135–145)
TCO2: 31 mmol/L (ref 22–32)
pCO2 arterial: 44.5 mmHg (ref 32.0–48.0)
pH, Arterial: 7.445 (ref 7.350–7.450)
pO2, Arterial: 273 mmHg — ABNORMAL HIGH (ref 83.0–108.0)

## 2021-04-01 LAB — LACTIC ACID, PLASMA
Lactic Acid, Venous: 3.1 mmol/L (ref 0.5–1.9)
Lactic Acid, Venous: 7 mmol/L (ref 0.5–1.9)

## 2021-04-01 LAB — APTT: aPTT: 31 seconds (ref 24–36)

## 2021-04-01 LAB — GLUCOSE, CAPILLARY: Glucose-Capillary: 104 mg/dL — ABNORMAL HIGH (ref 70–99)

## 2021-04-01 MED ORDER — ENOXAPARIN SODIUM 40 MG/0.4ML IJ SOSY: 40.0000 mg | PREFILLED_SYRINGE | INTRAMUSCULAR | Status: DC

## 2021-04-01 MED ORDER — LACTATED RINGERS IV BOLUS (SEPSIS)
1000.0000 mL | Freq: Once | INTRAVENOUS | Status: DC
  Administered 2021-04-01: 1000 mL via INTRAVENOUS

## 2021-04-01 MED ORDER — ETOMIDATE 2 MG/ML IV SOLN
INTRAVENOUS | Status: AC | PRN
  Administered 2021-04-01: 20 mg via INTRAVENOUS

## 2021-04-01 MED ORDER — LACTATED RINGERS IV BOLUS (SEPSIS): 1000.0000 mL | Freq: Once | INTRAVENOUS | Status: DC

## 2021-04-01 MED ORDER — NOREPINEPHRINE 4 MG/250ML-% IV SOLN
INTRAVENOUS | Status: AC
  Administered 2021-04-01: 2 ug/min via INTRAVENOUS
  Filled 2021-04-01: qty 250

## 2021-04-01 MED ORDER — PIPERACILLIN-TAZOBACTAM 3.375 G IVPB: 3.3750 g | Freq: Three times a day (TID) | INTRAVENOUS | Status: DC

## 2021-04-01 MED ORDER — FAMOTIDINE IN NACL 20-0.9 MG/50ML-% IV SOLN
20.0000 mg | Freq: Every day | INTRAVENOUS | Status: DC
  Filled 2021-04-01: qty 50

## 2021-04-01 MED ORDER — VANCOMYCIN HCL 2000 MG/400ML IV SOLN
2000.0000 mg | INTRAVENOUS | Status: AC
  Administered 2021-04-01: 2000 mg via INTRAVENOUS
  Filled 2021-04-01: qty 400

## 2021-04-01 MED ORDER — SUCCINYLCHOLINE CHLORIDE 20 MG/ML IJ SOLN
INTRAMUSCULAR | Status: AC | PRN
  Administered 2021-04-01: 100 mg via INTRAVENOUS

## 2021-04-01 MED ORDER — NOREPINEPHRINE 16 MG/250ML-% IV SOLN
0.0000 ug/min | INTRAVENOUS | Status: DC
  Administered 2021-04-01: 40 ug/min via INTRAVENOUS
  Filled 2021-04-01: qty 250

## 2021-04-01 MED ORDER — LACTATED RINGERS IV SOLN: INTRAVENOUS | Status: DC

## 2021-04-01 MED ORDER — VASOPRESSIN 20 UNITS/100 ML INFUSION FOR SHOCK
0.0300 [IU]/min | INTRAVENOUS | Status: DC
  Filled 2021-04-01: qty 100

## 2021-04-01 MED ORDER — ROCURONIUM BROMIDE 10 MG/ML (PF) SYRINGE
PREFILLED_SYRINGE | INTRAVENOUS | Status: AC
  Filled 2021-04-01: qty 10

## 2021-04-01 MED ORDER — POLYETHYLENE GLYCOL 3350 17 G PO PACK: 17.0000 g | PACK | Freq: Every day | ORAL | Status: DC | PRN

## 2021-04-01 MED ORDER — DOCUSATE SODIUM 50 MG/5ML PO LIQD: 100.0000 mg | Freq: Two times a day (BID) | ORAL | Status: DC | PRN

## 2021-04-01 MED ORDER — ACETAMINOPHEN 650 MG RE SUPP
650.0000 mg | Freq: Once | RECTAL | Status: AC
  Administered 2021-04-01: 650 mg via RECTAL
  Filled 2021-04-01: qty 1

## 2021-04-01 MED ORDER — LACTATED RINGERS IV BOLUS (SEPSIS)
500.0000 mL | Freq: Once | INTRAVENOUS | Status: AC
  Administered 2021-04-01: 500 mL via INTRAVENOUS

## 2021-04-01 MED ORDER — LACTATED RINGERS IV BOLUS (SEPSIS)
1000.0000 mL | Freq: Once | INTRAVENOUS | Status: AC
  Administered 2021-04-01: 1000 mL via INTRAVENOUS

## 2021-04-01 MED ORDER — PIPERACILLIN-TAZOBACTAM 3.375 G IVPB 30 MIN
3.3750 g | INTRAVENOUS | Status: AC
  Administered 2021-04-01: 3.375 g via INTRAVENOUS
  Filled 2021-04-01: qty 50

## 2021-04-01 MED ORDER — SUCCINYLCHOLINE CHLORIDE 200 MG/10ML IV SOSY
PREFILLED_SYRINGE | INTRAVENOUS | Status: AC
  Filled 2021-04-01: qty 10

## 2021-04-01 MED ORDER — SODIUM CHLORIDE 0.9 % IV SOLN: INTRAVENOUS | Status: DC

## 2021-04-01 MED ORDER — VANCOMYCIN HCL 1500 MG/300ML IV SOLN: 1500.0000 mg | INTRAVENOUS | Status: DC

## 2021-04-01 MED ORDER — ETOMIDATE 2 MG/ML IV SOLN
INTRAVENOUS | Status: AC
  Filled 2021-04-01: qty 20

## 2021-04-01 MED ORDER — NOREPINEPHRINE 4 MG/250ML-% IV SOLN: 0.0000 ug/min | INTRAVENOUS | Status: DC

## 2021-04-04 LAB — CULTURE, BLOOD (SINGLE): Special Requests: ADEQUATE

## 2021-04-06 LAB — CULTURE, BLOOD (SINGLE): Culture: NO GROWTH

## 2021-04-17 NOTE — Code Documentation (Addendum)
Pt coming in with nasal trumpet in LT nare. Ventilations are being assisted by EMS.

## 2021-04-17 NOTE — Progress Notes (Signed)
Pt terminally extubated per MD. MD aware, RN at bedside.

## 2021-04-17 NOTE — ED Notes (Signed)
CVC noted to be in correct place by portable CXR. Okay given by MD Pollina to use CVC for pressure bag fluids

## 2021-04-17 NOTE — Significant Event (Signed)
    PATIENT NAME: Brian [name] [facility] MEDICAL RECORD NUMBER: [id number] Birthday: 03-03-1936  Age: 85 y.o. Admit Date: 2021/04/18  Provider: Baltazar Apo  Indication: PEA in the setting of septic shock  Technical Description:  CPR performance duration: 18 minutes +3 minutes Was defibrillation or cardioversion used ?  No Was external pacer placed ?  No Was patient intubated pre/post CPR ?  Patient was intubated pre-CPR Was transvenous pacer placed ?  No  Medications Administered Include      Yes/no Amiodarone x  Atropin 1  Calcium x  Epinephrine 4+1  Lidocaine x  Mg x  Norepinephrine 40 mcg  Phenylephrine x  Sodium bicarbonate 2  Vasopression 0.03   Evaluation Final Status - Was patient successfully resuscitated ? Yes If successfully resuscitated - what is current rhythm ? Sinus tachycardia If successfully resuscitated - what is current hemodynamic status ? Severe shock  Miscellaneous Information 85 year old man who was admitted overnight with acute respiratory failure and septic shock in the setting of a right lower lobe pneumonia.  He arrived to the ICU and was found to be pulseless, PEA.  CPR initiated as above.  Evaluation of his labs (4 AM) did not show any significant metabolic disarray.  Transient stabilization to sinus rhythm after 18 minutes of CPR, then back to PEA.  3 more minutes of CPR and restoration of a pulse on norepinephrine 40, vasopressin 0.03.  I spoke with the patient's son and brother by phone and explained the situation.  Explained that I am concerned that he will again devolve into PEA but that he is receiving maximal therapy and that we may not be able to prevent his decline.  I believe we have done every aggressive intervention and further CPR are will likely only provide transient stabilization.  If he goes back into PEA I will not restart CPR.  Otherwise we will try to treat him as aggressively as possible with his current interventions.  Rose Fillers  Parker Wherley 02-Aug-20229:01 AM

## 2021-04-17 NOTE — Code Documentation (Signed)
Pt is being ventilated by provider at bedside

## 2021-04-17 NOTE — ED Provider Notes (Addendum)
[facility] DEPARTMENT Provider Note   CSN: [id number] Arrival date & time: 04-24-21  0300     History Chief Complaint  Patient presents with   Respiratory Distress    Brian [name] is a 85 y.o. male with a history of prior CVA with residual hemiplegia and dysphagia, diabetes mellitus, hypertension, hyperlipidemia, & paroxysmal atrial fibrillation who presents to the emergency department via EMS from [facility] due to staff noting congestion and hypoxia tonight.  Per EMS report the patient had vomited after tube feeding earlier that day, tonight he seemed congested and short of breath therefore they checked his SPO2 and it was in the 60s on room air.  Per EMS placed on nonrebreather with improvement to 88%, ultimately started assisting with BVM with improvement to low 90s.  EMS noted fever of 102 and tachycardia to 110.  Chart review for additional history: Recent admission 03/08/2021 through 03/15/2021, presented due to acute hypoxic respiratory failure felt to be secondary to aspiration pneumonia, required supplemental oxygen and was treated with antibiotics and subsequently discharged back to SNF.  Level 5 caveat applies secondary to acuity of condition.  HPI     Past Medical History:  Diagnosis Date   ALLERGIC RHINITIS 10/13/2007   ANEMIA, CHRONIC DISEASE NEC 03/31/2007   BENIGN PROSTATIC HYPERTROPHY 03/31/2007   Chest pain    Stress echo, normal, December, 2012   Chronic anemia    DIABETES MELLITUS, TYPE II 03/31/2007   [place] DISEASE, LUMBAR 03/31/2007   ED (erectile dysfunction)    Ejection fraction    EF  normal, stress echo, December,  2012   History of prostatitis    HYPERLIPIDEMIA 03/31/2007   HYPERTENSION 03/31/2007   Nephrolithiasis 12/07/2010   Obesity    POLYP, ANAL AND RECTAL 03/31/2007   Right eye trauma    hx as child   Stroke [facility])     Patient Active Problem List   Diagnosis Date Noted   Aspiration pneumonia ([place]) 03/08/2021    Pressure injury of skin 02/06/2021   SVT (supraventricular tachycardia) (HCC)    ICH (intracerebral hemorrhage) ([place]) 01/23/2021   Skin lesion of right leg 11/14/2020   Uncontrolled type 2 diabetes mellitus with circulatory disorder causing erectile dysfunction ([place]) 11/14/2020   Bee sting reaction 07/10/2020   Vitamin D deficiency 07/10/2020   Right lumbar radiculopathy 10/20/2019   Insomnia 01/31/2019   Acute ischemic left ACA stroke ([place]) 06/23/2018   History of CVA (cerebrovascular accident)    PAF (paroxysmal atrial fibrillation) ([place])    Diastolic dysfunction    Acute CVA (cerebrovascular accident) ([place]) 04/09/2018   Cerebral embolism with cerebral infarction 04/02/2018   Right leg pain 12/04/2017   Grief 12/04/2017   Poorly controlled type 2 diabetes mellitus with circulatory disorder ([place]) 10/02/2016   Allergic conjunctivitis 01/12/2016   Abdominal pain, other specified site 06/23/2012   Erectile dysfunction 12/13/2011   Ejection fraction    Fatigue 06/14/2011   Encounter for well adult exam with abnormal findings 06/10/2011   Nephrolithiasis 12/07/2010   Hypertensive crisis 12/07/2010   SKIN LESION 03/01/2008   ALLERGIC RHINITIS 10/13/2007   Mixed hyperlipidemia 03/31/2007   Overweight (BMI 25.0-29.9) 03/31/2007   ANEMIA, CHRONIC DISEASE NEC 03/31/2007   POLYP, ANAL AND RECTAL 03/31/2007   BENIGN PROSTATIC HYPERTROPHY 03/31/2007   [place] DISEASE, LUMBAR 03/31/2007    Past Surgical History:  Procedure Laterality Date   IR GASTROSTOMY TUBE MOD SED  02/08/2021   LUMBAR [place] SURGERY  1999   PROSTATE BIOPSY     s/p   RECTAL POLYPECTOMY     Transanal excision 10/2005   SKIN BIOPSY     s/p right upper back 2009- benign Dr. Tonia Brooms       Family History  Problem Relation Age of Onset   Colon polyps Mother    Stroke Mother        due to ICA stenosis   CVA Neg Hx    Seizures Neg Hx     Social History   Tobacco Use   Smoking status: Former    Years: 10.00     Types: Cigarettes    Quit date: 1995    Years since quitting: 27.5   Smokeless tobacco: Never  Vaping Use   Vaping Use: Never used  Substance Use Topics   Alcohol use: Yes    Alcohol/week: 1.0 standard drink    Types: 1 Cans of beer per week    Comment: occasional or yearly   Drug use: No    Home Medications Prior to Admission medications   Medication Sig Start Date End Date Taking? Authorizing Provider  acetaminophen (TYLENOL) 650 MG CR tablet Take 650 mg by mouth every 6 (six) hours as needed for pain.    [provider]  amiodarone (PACERONE) 200 MG tablet Place 1 tablet (200 mg total) into feeding tube daily. 02/28/21   Charlynne Cousins, MD  amLODipine (NORVASC) 10 MG tablet Place 1 tablet (10 mg total) into feeding tube daily. 03/15/21   Aline August, MD  atorvastatin (LIPITOR) 80 MG tablet Place 1 tablet (80 mg total) into feeding tube every evening. 03/15/21   Aline August, MD  Blood Glucose Monitoring Suppl [facility] VERIO) w/Device KIT Use as directed twice daily E11.9 08/11/19   [name], MD  carvedilol (COREG) 12.5 MG tablet Place 1 tablet (12.5 mg total) into feeding tube 2 (two) times daily with a meal. 03/15/21   Starla Link, Kshitiz, MD  Dextran 70-Hypromellose (LUBRICATING TEARS EYE DROPS OP) Place 1 drop into both eyes at bedtime as needed (dry eyes).    [provider]  gabapentin (NEURONTIN) 100 MG capsule Take 1 capsule (100 mg total) by mouth 2 (two) times daily. 03/15/21   Aline August, MD  guaifenesin (ROBITUSSIN) 100 MG/5ML syrup Place 200 mg into feeding tube 2 (two) times daily.    [provider]  insulin detemir (LEVEMIR) 100 UNIT/ML injection Inject 0.1 mLs (10 Units total) into the skin 2 (two) times daily. 03/15/21   Aline August, MD  insulin lispro (HUMALOG) 100 UNIT/ML injection Inject 0-14 Units into the skin See admin instructions. Sliding scale as follows: Check blood sugar three times daily. If blood sugar is <200=0u,  201-250=2u, 251-300=4u, 301-350=6u, 351-400=8u, 401-450=10u, 451-500=12u, if reads HIGH give 14u, recheck in 2 hours, and if blood sugar is >400  or <100 call PEC (Do not change times)    [provider]  Insulin Pen Needle 32G X 4 MM MISC Use 1x a day 01/03/21   Philemon Kingdom, MD  Lancets MISC Use as directed twice daily E11.9 08/11/19   [name], MD  metFORMIN (GLUCOPHAGE) 500 MG tablet Place 500 mg into feeding tube 2 (two) times daily with a meal.    [provider]  Nutritional Supplements (FEEDING SUPPLEMENT, JEVITY 1.5 CAL,) LIQD Place 30 mLs into feeding tube continuous.    [provider]  [facility] VERIO test strip USE AS DIRECTED TWICE DAILY E11.9 08/19/20  [name], MD  sitaGLIPtin (JANUVIA) 50 MG tablet Place 50 mg into feeding tube daily.    [provider]  Water For Irrigation, Sterile (FREE WATER) SOLN Place 100 mLs into feeding tube every 4 (four) hours. 02/28/21   Charlynne Cousins, MD    Allergies    Patient has no known allergies.  Review of Systems   Review of Systems  Unable to perform ROS: Acuity of condition   Physical Exam Updated Vital Signs BP 110/61   Pulse (!) 106   Resp (!) 27   SpO2 93%   Physical Exam Vitals and nursing note reviewed.  Constitutional:      General: He is in acute distress.     Appearance: He is ill-appearing.  HENT:     Head: Normocephalic and atraumatic.     Nose: Congestion present.  Cardiovascular:     Rate and Rhythm: Tachycardia present.  Pulmonary:     Breath sounds: Rhonchi and rales present.     Comments: SPO2 82 to 88% on nonrebreather, patient with tachypnea, coarse breath sounds throughout. Abdominal:     General: There is no distension.     Tenderness: There is no abdominal tenderness. There is no guarding or rebound.     Comments: Gastrostomy tube in place without surrounding erythema, purulent drainage, or warmth.  Genitourinary:    Comments: Catheter in place  with dark urine in bag.  Musculoskeletal:     Cervical back: Neck supple.     Right lower leg: No edema.     Left lower leg: No edema.  Skin:    General: Skin is warm.    ED Results / Procedures / Treatments   Labs (all labs ordered are listed, but only abnormal results are displayed) Labs Reviewed  LACTIC ACID, PLASMA - Abnormal; Notable for the following components:      Result Value   Lactic Acid, Venous 3.1 (*)    All other components within normal limits  LACTIC ACID, PLASMA - Abnormal; Notable for the following components:   Lactic Acid, Venous 7.0 (*)    All other components within normal limits  COMPREHENSIVE METABOLIC PANEL - Abnormal; Notable for the following components:   Glucose, Bld 202 (*)    BUN 57 (*)    Creatinine, Ser 1.65 (*)    Albumin 2.5 (*)    Total Bilirubin 1.3 (*)    GFR, Estimated 41 (*)    All other components within normal limits  CBC WITH DIFFERENTIAL/PLATELET - Abnormal; Notable for the following components:   RBC 3.56 (*)    Hemoglobin 8.5 (*)    HCT 29.5 (*)    MCH 23.9 (*)    MCHC 28.8 (*)    RDW 17.4 (*)    nRBC 0.4 (*)    Lymphs Abs 0.5 (*)    All other components within normal limits  PROTIME-INR - Abnormal; Notable for the following components:   Prothrombin Time 16.8 (*)    INR 1.4 (*)    All other components within normal limits  I-STAT ARTERIAL BLOOD GAS, ED - Abnormal; Notable for the following components:   pO2, Arterial 273 (*)    Bicarbonate 30.0 (*)    Acid-Base Excess 6.0 (*)    HCT 28.0 (*)    Hemoglobin 9.5 (*)    All other components within normal limits  RESP PANEL BY RT-PCR (FLU A&B, COVID) ARPGX2  CULTURE, BLOOD (SINGLE)  URINE CULTURE  CULTURE, BLOOD (SINGLE)  APTT  URINALYSIS, ROUTINE W REFLEX MICROSCOPIC  BLOOD GAS, ARTERIAL  CBC  CREATININE, SERUM    EKG EKG Interpretation  Date/Time:  04/10/21 03:00:54 EDT Ventricular Rate:  102 PR Interval:  218 QRS Duration: 122 QT  Interval:  336 QTC Calculation: 438 R Axis:   -73 Text Interpretation: Sinus tachycardia Prolonged PR interval RBBB and LAFB No significant change since last tracing Confirmed by Orpah Greek ([id number]) on 04-10-21 3:30:42 AM  Radiology DG Chest Port 1 View  Result Date: Apr 10, 2021 CLINICAL DATA:  85 year old male with history of central line placement. EXAM: PORTABLE CHEST 1 VIEW COMPARISON:  Chest x-[place]ub[place] 2021-04-10. FINDINGS: An endotracheal tube is in place with tip 6.9 cm above the carina. There is a right-sided internal jugular central venous catheter with tip terminating in the distal superior vena cava. A nasogastric tube is seen extending into the stomach, however, the tip of the nasogastric tube extends below the lower margin of the image. Patchy multifocal airspace disease and interstitial prominence noted in the right mid to lower lung and at the left lung base. Overall, aeration appears slightly worse than the prior study. No pleural effusions. No pneumothorax. 1.2 cm nodular density projecting over the left upper lobe not confidently seen on the prior study, potentially exterior to the patient. No evidence of pulmonary edema. Heart size is normal. Upper mediastinal contours are within normal limits. Aortic atherosclerosis. IMPRESSION: 1. Support apparatus, as above. 2. Worsening patchy multifocal airspace consolidation in the lungs bilaterally, compatible with worsening multilobar bilateral pneumonia (right greater than left). 3. Aortic atherosclerosis. 4. New nodular density projecting over the left upper lobe not confidently identified on the earlier examination, potentially exterior to the patient. Attention on follow-up studies is recommended. Electronically Signed   By: Vinnie Langton M.D.   On: 04/10/21 05:22   DG Chest Portable 1 View  Result Date: 04-10-2021 CLINICAL DATA:  Post intubation EXAM: PORTABLE CHEST 1 VIEW COMPARISON:  03/09/2021 FINDINGS: Endotracheal tube  terminates 7.5 cm above the carina. Enteric tube courses below the diaphragm. Patchy right lower lung opacity, suspicious for pneumonia. Left lung is clear. No pleural effusion or pneumothorax. The heart is normal in size. IMPRESSION: Patchy right lower lung opacity, suspicious for pneumonia. Support apparatus as above. Electronically Signed   By: Julian Hy M.D.   On: Apr 10, 2021 03:51    Procedures Procedure Name: Intubation Date/Time: Apr 10, 2021 5:18 AM Performed by: Amaryllis Dyke, PA-C Pre-anesthesia Checklist: Patient identified, Patient being monitored, Emergency Drugs available, Timeout performed and Suction available Oxygen Delivery Method: Non-rebreather mask Preoxygenation: Pre-oxygenation with 100% oxygen Induction Type: Rapid sequence Ventilation: Mask ventilation without difficulty Laryngoscope Size: Glidescope and 4 Tube size: 7.5 mm Number of attempts: 2 (1st attempt with secretions obstrucing view with glidescope- this was removed, returned to bagging patient with successful intubation on 2nd attempt) Placement Confirmation: ETT inserted through vocal cords under direct vision, CO2 detector and Breath sounds checked- equal and bilateral Secured at: 24 cm Tube secured with: ETT holder Dental Injury: Teeth and Oropharynx as per pre-operative assessment     .Central Line  Date/Time: Apr 10, 2021 4:20 AM Performed by: Amaryllis Dyke, PA-C Authorized by: Amaryllis Dyke, PA-C   Consent:    Consent obtained:  Emergent situation Universal protocol:    Procedure explained and questions answered to patient or proxy's satisfaction: yes     Relevant documents present and verified: yes     Test results available: yes     Imaging studies available:  yes     Required blood products, implants, devices, and special equipment available: yes     Site/side marked: yes     Immediately prior to procedure, a time out was called: yes     Patient identity  confirmed:  Provided demographic data Pre-procedure details:    Indication(s): central venous access and insufficient peripheral access     Hand hygiene: Hand hygiene performed prior to insertion     Sterile barrier technique: All elements of maximal sterile technique followed     Skin preparation:  Chlorhexidine   Skin preparation agent: Skin preparation agent completely dried prior to procedure   Anesthesia:    Anesthesia method:  Local infiltration   Local anesthetic:  Lidocaine 1% w/o epi Procedure details:    Location:  R internal jugular   Patient position:  Trendelenburg   Procedural supplies:  Triple lumen   Ultrasound guidance: yes     Ultrasound guidance timing: real time     Sterile ultrasound techniques: Sterile gel and sterile probe covers were used     Successful placement: yes   Post-procedure details:    Post-procedure:  Dressing applied and line sutured   Assessment:  No pneumothorax on x-[place] and placement verified by x-Frank   Procedure completion:  Tolerated well, no immediate complications .Critical Care  Date/Time: 04/20/21 6:13 AM Performed by: Amaryllis Dyke, PA-C Authorized by: Amaryllis Dyke, PA-C    CRITICAL CARE Performed by: Kennith Maes   Total critical care time: 55 minutes  Critical care time was exclusive of separately billable procedures and treating other patients.  Critical care was necessary to treat or prevent imminent or life-threatening deterioration.  Critical care was time spent personally by me on the following activities: development of treatment plan with patient and/or surrogate as well as nursing, discussions with consultants, evaluation of patient's response to treatment, examination of patient, obtaining history from patient or surrogate, ordering and performing treatments and interventions, ordering and review of laboratory studies, ordering and review of radiographic studies, pulse oximetry and re-evaluation  of patient's condition.   Medications Ordered in ED Medications  rocuronium bromide 100 MG/10ML SOSY (  Not Given 2021-04-20 0313)  etomidate (AMIDATE) injection ( Intravenous Canceled Entry 2021/04/20 0315)  succinylcholine (ANECTINE) injection ( Intravenous Canceled Entry April 20, 2021 0315)    ED Course  I have reviewed the triage vital signs and the nursing notes.  Pertinent labs & imaging results that were available during my care of the patient were reviewed by me and considered in my medical decision making (see chart for details).    MDM Rules/Calculators/A&P                          Patient presents to the emergency department via EMS in respiratory distress with acute hypoxic respiratory failure requiring intubation shortly following arrival- confirmed patient is full code.  Intubation procedure performed with attending Dr. Betsey Holiday at the bedside.  Patient noted to be febrile therefore code sepsis was initiated with broad-spectrum antibiotics.  Patient quickly became hypotensive and there was difficulty with IV access therefore I placed a central line with supervising physician Dr. Betsey Holiday at the bedside with initiation of 30 cc/kg bolus.  Suppository Tylenol ordered.  Additional history obtained:  Additional history obtained from chart review & nursing note review.   Lab Tests:  I reviewed and interpreted labs, which included:  CBC: Anemia, no leukocytosis CMP: AKI Lactic acid: Elevated at 3.1 COVID/influenza: Negative  PT/INR: Mildly elevated  Imaging Studies ordered:  I ordered imaging studies- I independently reviewed, formal radiology impression shows:  Initial CXR: Patchy right lower lung opacity, suspicious for pneumonia. Support apparatus as above.  Repeat CXR S/p central line placement: 1. Support apparatus, as above. 2. Worsening patchy multifocal airspace consolidation in the lungs bilaterally, compatible with worsening multilobar bilateral pneumonia (right greater than  left). 3. Aortic atherosclerosis. 4. New nodular density projecting over the left upper lobe not confidently identified on the earlier examination, potentially exterior to the patient. Attention on follow-up studies is recommended  CT head ordered given patient has a history of prior hemorrhagic stroke.  ED Course:  Patient presenting with sepsis secondary to pneumonia with acute respiratory failure requiring intubation, acute kidney injury, elevated lactic acid at 3.1, and hypotension.  Plan to admit to critical care service.  05:20: CONSULT: Discussed with intensivist- will come see patient in the ED.   Patient remained hypotensive in the 60s with 2 L of fluid, Levophed started.  Sepsis - Repeat Assessment Performed at: 06:40AM Vitals Blood pressure (!) 82/44, pulse 81, temperature (!) 102.6 F (39.2 C), temperature source Rectal, resp. rate (!) 8, height 6' (1.829 m), weight 102.8 kg, SpO2 93 %. Heart: Regular rate.  Lungs: Course @ the bases.  Capillary Refill: 3 seconds  Peripheral Pulse:Radial pulse palpable Skin: Warm and dry   This is a shared visit with supervising physician Dr. Betsey Holiday who has independently evaluated patient & provided guidance in evaluation/management/disposition, in agreement with care    Portions of this note were generated with Dragon dictation software. Dictation errors may occur despite best attempts at proofreading.  Final Clinical Impression(s) / ED Diagnoses Final diagnoses:  Pneumonia  Sepsis with acute hypoxic respiratory failure and septic shock, due to unspecified organism [facility])  AKI (acute kidney injury) [facility])    Rx / DC Orders ED Discharge Orders     None        Amaryllis Dyke, PA-C 04/06/21 0649    Amaryllis Dyke, PA-C April 06, 2021 0650    Orpah Greek, MD 04/02/21 [phone]

## 2021-04-17 NOTE — Sepsis Progress Note (Signed)
Code Sepsis initiated @ 0330 AM. Warren Lacy following.

## 2021-04-17 NOTE — Progress Notes (Signed)
PCCM Progress Note  Refer also to CPR notes from earlier today.  85 year old man with atrial fibrillation, ICH and CVA with dysphagia and a PEG tube, diabetes, recurrent pneumonias, presumed aspiration pneumonias.  Admitted early morning 7/16 with right lower lobe pneumonia and septic shock, acute respiratory failure requiring mechanical ventilation. On arrival to the ICU 7/16 patient devolved to PEA on norepinephrine 40. Underwent CPR x2, 18 minutes and then 3 minutes with transient stabilization.  Vasopressin added.  Unfortunately we were unable to maintain stability and he had recurrent PEA, devolved to wide-complex bradycardia and then asystole.  I spoke with the patient's son [name] and his brother [name] as these events were transpiring to keep them updated.  Explained that we were doing all aggressive care but that stability had not been fully achieved.  Will call and notify the patient's family now. [name] is on his way to the hospital.   Independent CC time 35 minutes   Baltazar Apo, MD, PhD 05-01-21, 9:21 AM [facility] [phone] or if no answer before 7:00PM call [phone] For any issues after 7:00PM please call eLink [phone]

## 2021-04-17 NOTE — ED Notes (Signed)
No urine returned from [facility] at this time. Pt hypotensive and currently on pressors.

## 2021-04-17 NOTE — Progress Notes (Deleted)
 NAME:  Brian [name], MRN:  [id number], DOB:  07/31/1936, LOS: 0 ADMISSION DATE:  03/23/2021, CONSULTATION DATE: 04/03/2021 REFERRING MD: [facility], CHIEF COMPLAINT: Pneumonia and septic shock  History of Present Illness:  Patient is an 85-year-old male with a history of chronic atrial fibrillation, recent intracranial hemorrhage and stroke with dysphagia requiring PEG tube, diabetes mellitus type 2, recently admitted to the hospital last month with what was felt to be aspiration pneumonia.  He received a full 7-day course of IV Unasyn at that time.  He was evaluated by palliative care at his care facility and after their discussion patient opted to remain a full code.  Patient was brought by EMS from his care facility earlier this morning in respiratory distress with O2 saturations in the 60s only improving to the high 80s on 100% nonrebreather.  Patient was intubated central line was placed.  Noted to have a fever of 101 with associated tachycardia and hypotension.  Patient has been started on Levophed Zosyn and vancomycin.  It is reported by ER providers that tube feeding was suctioned from his ET tube at time of intubation.  Post intubation films shows ET tube in correct position but will need to be advanced some, central line in good position.  Post intubation ABG shows a pH of 7.445, PCO2 of 44, PO2 of 273.  Glucose was 202, bicarb 24, creatinine 1.65.  White count 4.5 hemoglobin of 9.8 hematocrit 28 platelet count 252.  Pertinent  Medical History    Mixed hyperlipidemia 03/31/2007   Overweight (BMI 25.0-29.9) 03/31/2007   ANEMIA, CHRONIC DISEASE NEC 03/31/2007   ALLERGIC RHINITIS 10/13/2007   POLYP, ANAL AND RECTAL 03/31/2007   BENIGN PROSTATIC HYPERTROPHY 03/31/2007   SKIN LESION 03/01/2008   DISC DISEASE, LUMBAR 03/31/2007   Nephrolithiasis 12/07/2010   Hypertensive crisis 12/07/2010   Encounter for well adult exam with abnormal findings 06/10/2011   Fatigue  06/14/2011   Ejection fraction     Erectile dysfunction 12/13/2011   Abdominal pain, other specified site 06/23/2012   Allergic conjunctivitis 01/12/2016   Poorly controlled type 2 diabetes mellitus with circulatory disorder (HCC) 10/02/2016   Right leg pain 12/04/2017   Grief 12/04/2017   Cerebral embolism with cerebral infarction 04/02/2018   Acute CVA (cerebrovascular accident) (HCC) 04/09/2018   History of CVA (cerebrovascular accident)     PAF (paroxysmal atrial fibrillation) (HCC)     Diastolic dysfunction     Acute ischemic left ACA stroke (HCC) 06/23/2018   Insomnia 01/31/2019   Right lumbar radiculopathy 10/20/2019   Bee sting reaction 07/10/2020   Vitamin D deficiency 07/10/2020   Skin lesion of right leg 11/14/2020   Uncontrolled type 2 diabetes mellitus with circulatory disorder causing erectile dysfunction (HCC) 11/14/2020   ICH (intracerebral hemorrhage) (HCC) 01/23/2021   SVT (supraventricular tachycardia) (HCC)     Pressure injury of skin 02/06/2021   Aspiration pneumonia (HCC) 03/08/2021    Significant Hospital Events: Including procedures, antibiotic start and stop dates in addition to other pertinent events   Presented to emergency room 04/09/2021 septic with right lower lobe pneumonia secondary to aspiration requiring intubation  Interim History / Subjective:  NA  Objective   Blood pressure (!) 69/43, pulse 77, temperature (!) 102.6 F (39.2 C), temperature source Rectal, resp. rate 16, height 6' (1.829 m), weight 102.8 kg, SpO2 93 %.    Vent Mode: PRVC FiO2 (%):  [100 %] 100 % Set Rate:  [28 bmp] 28 bmp Vt Set:  [  620 mL] 620 mL PEEP:  [8 cmH20] 8 cmH20   Intake/Output Summary (Last 24 hours) at April 26, 2021 0535 Last data filed at 04-26-2021 0534 Gross per 24 hour  Intake 3000 ml  Output 300 ml  Net 2700 ml   Filed Weights   Apr 26, 2021 0343  Weight: 102.8 kg    Examination: General: Elderly male intubated and sedated HENT: Within normal  limits Lungs: Coarse bilaterally Cardiovascular: Tachycardic Abdomen: Feeding tube in place Extremities: Within normal limits Neuro: Sedated GU: N/A  Resolved Hospital Problem list   NA  Assessment & Plan:  1.  Aspiration pneumonia: Clinical history, radiographic studies, findings at time of intubation are consistent with aspiration of stomach contents.  We will continue Zosyn and vancomycin with mechanical ventilation.  2.  Hypotension/septic shock: Continue IV fluid expansion and vasopressor support as needed  3.  Anemia: Monitor, not at transfusion threshold  4.  AKI with creatinine of 1.62: Continue volume expansion and monitor  5.  History of CVA with right-sided hemiparesis and dysphagia    Best Practice (right click and "Reselect all SmartList Selections" daily)   Diet/type: NPO DVT prophylaxis: LMWH GI prophylaxis: H2B Lines: Central line Foley:  Yes, and it is still needed Code Status:  full code Last date of multidisciplinary goals of care discussion [patient intubated sedated unable to participate]  Labs   CBC: Recent Labs  Lab 2021/04/26 0355 04/26/2021 0357  WBC 4.5  --   NEUTROABS PENDING  --   HGB 8.5* 9.5*  HCT 29.5* 28.0*  MCV 82.9  --   PLT 252  --     Basic Metabolic Panel: Recent Labs  Lab 26-Apr-2021 0355 2021/04/26 0357  NA 136 136  K 4.7 4.6  CL 102  --   CO2 24  --   GLUCOSE 202*  --   BUN 57*  --   CREATININE 1.65*  --   CALCIUM 9.0  --    GFR: Estimated Creatinine Clearance: 42.1 mL/min (A) (by C-G formula based on SCr of 1.65 mg/dL (H)). Recent Labs  Lab 04-26-2021 0355  WBC 4.5  LATICACIDVEN 3.1*    Liver Function Tests: Recent Labs  Lab 2021-04-26 0355  AST 31  ALT 23  ALKPHOS 60  BILITOT 1.3*  PROT 6.6  ALBUMIN 2.5*   No results for input(s): LIPASE, AMYLASE in the last 168 hours. No results for input(s): AMMONIA in the last 168 hours.  ABG    Component Value Date/Time   PHART 7.445 April 26, 2021 0357   PCO2ART  44.5 2021-04-26 0357   PO2ART 273 (H) 04/26/21 0357   HCO3 30.0 (H) 04/26/2021 0357   TCO2 31 2021/04/26 0357   O2SAT 100.0 Apr 26, 2021 0357     Coagulation Profile: Recent Labs  Lab Apr 26, 2021 0355  INR 1.4*    Cardiac Enzymes: No results for input(s): CKTOTAL, CKMB, CKMBINDEX, TROPONINI in the last 168 hours.  HbA1C: Hgb A1c MFr Bld  Date/Time Value Ref Range Status  01/28/2021 04:39 AM 6.1 (H) 4.8 - 5.6 % Final    Comment:    (NOTE)         Prediabetes: 5.7 - 6.4         Diabetes: >6.4         Glycemic control for adults with diabetes: <7.0   01/23/2021 03:42 PM 5.8 (H) 4.8 - 5.6 % Final    Comment:    (NOTE)         Prediabetes: 5.7 - 6.4  Diabetes: >6.4         Glycemic control for adults with diabetes: <7.0     CBG: No results for input(s): GLUCAP in the last 168 hours.  Review of Systems:   Unable to obtain, patient intubated and sedated  Past Medical History:  He,  has a past medical history of ALLERGIC RHINITIS (10/13/2007), ANEMIA, CHRONIC DISEASE NEC (03/31/2007), BENIGN PROSTATIC HYPERTROPHY (03/31/2007), Chest pain, Chronic anemia, DIABETES MELLITUS, TYPE II (03/31/2007), DISC DISEASE, LUMBAR (03/31/2007), ED (erectile dysfunction), Ejection fraction, History of prostatitis, HYPERLIPIDEMIA (03/31/2007), HYPERTENSION (03/31/2007), Nephrolithiasis (12/07/2010), Obesity, POLYP, ANAL AND RECTAL (03/31/2007), Right eye trauma, and Stroke (HCC).   Surgical History:   Past Surgical History:  Procedure Laterality Date   IR GASTROSTOMY TUBE MOD SED  02/08/2021   LUMBAR DISC SURGERY     1999   PROSTATE BIOPSY     s/p   RECTAL POLYPECTOMY     Transanal excision 10/2005   SKIN BIOPSY     s/p right upper back 2009- benign Dr. Gruber     Social History:   reports that he quit smoking about 27 years ago. He has never used smokeless tobacco. He reports current alcohol use of about 1.0 standard drink of alcohol per week. He reports that he does not use drugs.    Family History:  His family history includes Colon polyps in his mother; Stroke in his mother. There is no history of CVA or Seizures.   Allergies No Known Allergies   Home Medications  Prior to Admission medications   Medication Sig Start Date End Date Taking? Authorizing Provider  acetaminophen (TYLENOL) 650 MG CR tablet Take 650 mg by mouth every 6 (six) hours as needed for pain.    [provider]  amiodarone (PACERONE) 200 MG tablet Place 1 tablet (200 mg total) into feeding tube daily. 02/28/21   Feliz Ortiz, Abraham, MD  amLODipine (NORVASC) 10 MG tablet Place 1 tablet (10 mg total) into feeding tube daily. 03/15/21   Alekh, Kshitiz, MD  atorvastatin (LIPITOR) 80 MG tablet Place 1 tablet (80 mg total) into feeding tube every evening. 03/15/21   Alekh, Kshitiz, MD  Blood Glucose Monitoring Suppl (ONETOUCH VERIO) w/Device KIT Use as directed twice daily E11.9 08/11/19   John, James W, MD  carvedilol (COREG) 12.5 MG tablet Place 1 tablet (12.5 mg total) into feeding tube 2 (two) times daily with a meal. 03/15/21   Alekh, Kshitiz, MD  Dextran 70-Hypromellose (LUBRICATING TEARS EYE DROPS OP) Place 1 drop into both eyes at bedtime as needed (dry eyes).    [provider]  gabapentin (NEURONTIN) 100 MG capsule Take 1 capsule (100 mg total) by mouth 2 (two) times daily. 03/15/21   Alekh, Kshitiz, MD  guaifenesin (ROBITUSSIN) 100 MG/5ML syrup Place 200 mg into feeding tube 2 (two) times daily.    [provider]  insulin detemir (LEVEMIR) 100 UNIT/ML injection Inject 0.1 mLs (10 Units total) into the skin 2 (two) times daily. 03/15/21   Alekh, Kshitiz, MD  insulin lispro (HUMALOG) 100 UNIT/ML injection Inject 0-14 Units into the skin See admin instructions. Sliding scale as follows: Check blood sugar three times daily. If blood sugar is <200=0u, 201-250=2u, 251-300=4u, 301-350=6u, 351-400=8u, 401-450=10u, 451-500=12u, if reads HIGH give 14u, recheck in 2 hours, and if blood  sugar is >400  or <100 call PEC (Do not change times)    [provider]  Insulin Pen Needle 32G X 4 MM MISC Use 1x a day 01/03/21     Philemon Kingdom, MD  Lancets MISC Use as directed twice daily E11.9 08/11/19   [name], MD  metFORMIN (GLUCOPHAGE) 500 MG tablet Place 500 mg into feeding tube 2 (two) times daily with a meal.    [provider]  Nutritional Supplements (FEEDING SUPPLEMENT, JEVITY 1.5 CAL,) LIQD Place 30 mLs into feeding tube continuous.    [provider]  [facility] VERIO test strip USE AS DIRECTED TWICE DAILY E11.9 08/19/20   [name], MD  sitaGLIPtin (JANUVIA) 50 MG tablet Place 50 mg into feeding tube daily.    [provider]  Water For Irrigation, Sterile (FREE WATER) SOLN Place 100 mLs into feeding tube every 4 (four) hours. 02/28/21   Charlynne Cousins, MD     Critical care time: 45 minutes spent in bedside evaluation chart review and critical care planning

## 2021-04-17 NOTE — Progress Notes (Signed)
Pharmacy Antibiotic Note  Brian [name] is a 85 y.o. male admitted on 2021/04/29 with sepsis/asp pna. Presented from [facility] and required intubation in ED. Also reports of vomiting after TF at facility. Pharmacy has been consulted for Vancomycin and Zosyn dosing.  Plan: Zosyn 3.375gm IV q8h Vancomycin 2000mg  IV now then 1500 mg IV Q 24 hrs. Goal AUC 400-550. Expected AUC: 458 SCr used: 0.86, Vd coeff 0.5 Will f/u renal function, micro data, and pt's clinical condition Vanc levels prn   Height: 6' (182.9 cm) Weight: 102.8 kg (226 lb 10.1 oz) IBW/kg (Calculated) : 77.6  Temp (24hrs), Avg:102.6 F (39.2 C), Min:102.6 F (39.2 C), Max:102.6 F (39.2 C)  No results for input(s): WBC, CREATININE, LATICACIDVEN, VANCOTROUGH, VANCOPEAK, VANCORANDOM, GENTTROUGH, GENTPEAK, GENTRANDOM, TOBRATROUGH, TOBRAPEAK, TOBRARND, AMIKACINPEAK, AMIKACINTROU, AMIKACIN in the last 168 hours.  Estimated Creatinine Clearance: 80.7 mL/min (by C-G formula based on SCr of 0.86 mg/dL).    No Known Allergies  Antimicrobials this admission: 7/16 Zosyn >>  7/16 Vanc >>   Microbiology results:  BCx:   UCx:    Thank you for allowing pharmacy to be a part of this patient's care.  Sherlon Handing, PharmD, BCPS Please see amion for complete clinical pharmacist phone list 04/29/2021 3:47 AM

## 2021-04-17 NOTE — ED Triage Notes (Signed)
Pt bib EMS from [facility] with staff reporting severe congestion in patient with no relief after suctioning. Staff reports that pt had thrown up earlier in day after tube feeding. Pt arrives with nasal trumpet in RT nare and BVM in place to assist with resp.

## 2021-04-17 NOTE — ED Notes (Signed)
Per intensivist will hold off on CT until pt BP is more stable and will go to floor. Will alert ICU staff upon bed assignment.

## 2021-04-17 NOTE — Progress Notes (Signed)
Transported pt to 3M08 from ER without complication.

## 2021-04-17 NOTE — Progress Notes (Signed)
Patient pronounced by myself and Wilmer Floor, RN at [phone].  Patient was not alone.  Family was notified.  [facility] was notified.  Patient has been transferred to morgue.  Support was given to brother.

## 2021-04-17 NOTE — Progress Notes (Signed)
   04-15-2021 0830  Clinical Encounter Type  Visited With Patient not available  Visit Type Code  Referral From Nurse  Consult/Referral To Chaplain   Chaplain responded to page. The patient is being attended to by the medical team. The patient did not have support person present. Chaplain remains available. This note was prepared by Jeanine Luz, M.Div..  For questions please contact by phone [phone].

## 2021-04-17 NOTE — ED Notes (Signed)
PT PIV infiltrated at this time. Setting up for central line at this time. Pt hypotensive.

## 2021-04-17 DEATH — deceased

## 2021-04-30 DIAGNOSIS — R1310 Dysphagia, unspecified: Secondary | ICD-10-CM

## 2021-04-30 DIAGNOSIS — Z931 Gastrostomy status: Secondary | ICD-10-CM

## 2021-04-30 DIAGNOSIS — J9601 Acute respiratory failure with hypoxia: Secondary | ICD-10-CM | POA: Diagnosis present

## 2021-05-16 ENCOUNTER — Ambulatory Visit: Payer: Medicare HMO | Admitting: Internal Medicine

## 2021-05-18 NOTE — Death Summary Note (Signed)
DEATH SUMMARY   Patient Details  Name: Brian [name] MRN: [id number] DOB: 11-17-1935  Admission/Discharge Information   Admit Date:  04/14/21  Date of Death: Date of Death: 04/14/21  Time of Death: Time of Death: 0913  Length of Stay: 1  Referring Physician: [name], MD   Reason(s) for Hospitalization  Respiratory distress and hypoxia  Diagnoses  Preliminary cause of death:  Acute respiratory failure with hypoxia due to aspiration pneumonia  Secondary Diagnoses (including complications and co-morbidities):  Principal Problem:   Acute respiratory failure with hypoxia ([place]) Active Problems:   Aspiration pneumonia ([place])   Sepsis with acute hypoxic respiratory failure and septic shock ([place])   PEA (Pulseless electrical activity) ([place])   Acute renal failure (ARF) ([place])   PAF (paroxysmal atrial fibrillation) ([place])   Overweight (BMI 25.0-29.9)   Poorly controlled type 2 diabetes mellitus with circulatory disorder (HCC)   History of CVA (cerebrovascular accident)   Diastolic dysfunction   ICH (intracerebral hemorrhage) (HCC)   Dysphagia   S/P percutaneous endoscopic gastrostomy (PEG) tube placement [facility])   Brief Hospital Course (including significant findings, care, treatment, and services provided and events leading to death)  Brian [name] is a 85 y.o. year old male with atrial fibrillation, ICH and CVA with dysphagia and a PEG tube, diabetes, recurrent pneumonias, presumed aspiration pneumonias.  Brought to the emergency department 04-15-23 from his assisted living facility with progressive respiratory distress and hypoxemia.  Admitted early morning 2023/04/15 with right lower lobe pneumonia and septic shock, acute respiratory failure requiring mechanical ventilation, pressors. Received broad spectrum antibiotics.   On arrival to the ICU 2023/04/15 patient devolved to PEA on norepinephrine 40. Underwent CPR x2, 18 minutes and then 3 minutes with transient stabilization.  Vasopressin  added.  Unfortunately we were unable to maintain stability and he had recurrent PEA, devolved to wide-complex bradycardia and then asystole.  I spoke with the patient's son [name] and his brother [name] as these events were transpiring to keep them updated.  Explained that we were doing all aggressive care but that stability had not been fully achieved.   Patient expired at 09:13am April 14, 2021.   Pertinent Labs and Studies  Significant Diagnostic Studies DG CHEST PORT 1 VIEW  Result Date: Apr 14, 2021 CLINICAL DATA:  85 year old male endotracheal tube adjustment. EXAM: PORTABLE CHEST 1 VIEW COMPARISON:  0453 hours today. FINDINGS: Portable AP semi upright view at 0651 hours. Endotracheal tube tip is satisfactory between the level the clavicles and carina. Stable right IJ central line. Stable visible enteric tube. Lung ventilation not significantly changed. Confluent airspace opacity in the right lower lung with additional patchy, reticulonodular opacity bilaterally. No pneumothorax or pleural effusion. Normal cardiac size and mediastinal contours. IMPRESSION: 1. Endotracheal tube tip in satisfactory position between the clavicles and carina. Otherwise stable lines and tubes. 2. Stable ventilation from earlier today, including right lower lung peribronchial consolidation. Electronically Signed   By: Genevie Ann M.D.   On: 04/14/2021 06:59   DG Chest Port 1 View  Result Date: Apr 14, 2021 CLINICAL DATA:  85 year old male with history of central line placement. EXAM: PORTABLE CHEST 1 VIEW COMPARISON:  Chest x-[place] 2021-04-14. FINDINGS: An endotracheal tube is in place with tip 6.9 cm above the carina. There is a right-sided internal jugular central venous catheter with tip terminating in the distal superior vena cava. A nasogastric tube is seen extending into the stomach, however, the tip of the nasogastric tube extends below the lower margin of the image. Patchy multifocal  airspace disease and interstitial prominence  noted in the right mid to lower lung and at the left lung base. Overall, aeration appears slightly worse than the prior study. No pleural effusions. No pneumothorax. 1.2 cm nodular density projecting over the left upper lobe not confidently seen on the prior study, potentially exterior to the patient. No evidence of pulmonary edema. Heart size is normal. Upper mediastinal contours are within normal limits. Aortic atherosclerosis. IMPRESSION: 1. Support apparatus, as above. 2. Worsening patchy multifocal airspace consolidation in the lungs bilaterally, compatible with worsening multilobar bilateral pneumonia (right greater than left). 3. Aortic atherosclerosis. 4. New nodular density projecting over the left upper lobe not confidently identified on the earlier examination, potentially exterior to the patient. Attention on follow-up studies is recommended. Electronically Signed   By: Vinnie Langton M.D.   On: 2021/04/20 05:22   DG Chest Portable 1 View  Result Date: 04-20-2021 CLINICAL DATA:  Post intubation EXAM: PORTABLE CHEST 1 VIEW COMPARISON:  03/09/2021 FINDINGS: Endotracheal tube terminates 7.5 cm above the carina. Enteric tube courses below the diaphragm. Patchy right lower lung opacity, suspicious for pneumonia. Left lung is clear. No pleural effusion or pneumothorax. The heart is normal in size. IMPRESSION: Patchy right lower lung opacity, suspicious for pneumonia. Support apparatus as above. Electronically Signed   By: Julian Hy M.D.   On: 2021/04/20 03:51       Collene Gobble 04/30/2021, 2:53 PM

## 2021-05-18 NOTE — H&P (Signed)
NAME:  Brian [name], MRN:  [id number], DOB:  Aug 04, 1936, LOS: 1 ADMISSION DATE:  04-20-21, CONSULTATION DATE: 2021-04-20 REFERRING MD: [facility], CHIEF COMPLAINT: Pneumonia and septic shock  History of Present Illness:  Patient is an 85 year old male with a history of chronic atrial fibrillation, recent intracranial hemorrhage and stroke with dysphagia requiring PEG tube, diabetes mellitus type 2, recently admitted to the hospital last month with what was felt to be aspiration pneumonia.  He received a full 7-day course of IV Unasyn at that time.  He was evaluated by palliative care at his care facility and after their discussion patient opted to remain a full code.  Patient was brought by EMS from his care facility earlier this morning in respiratory distress with O2 saturations in the 60s only improving to the high 80s on 100% nonrebreather.  Patient was intubated central line was placed.  Noted to have a fever of 101 with associated tachycardia and hypotension.  Patient has been started on Levophed Zosyn and vancomycin.  It is reported by ER providers that tube feeding was suctioned from his ET tube at time of intubation.  Post intubation films shows ET tube in correct position but will need to be advanced some, central line in good position.  Post intubation ABG shows a pH of 7.445, PCO2 of 44, PO2 of 273.  Glucose was 202, bicarb 24, creatinine 1.65.  White count 4.5 hemoglobin of 9.8 hematocrit 28 platelet count 252.  Pertinent  Medical History    Mixed hyperlipidemia 03/31/2007   Overweight (BMI 25.0-29.9) 03/31/2007   ANEMIA, CHRONIC DISEASE NEC 03/31/2007   ALLERGIC RHINITIS 10/13/2007   POLYP, ANAL AND RECTAL 03/31/2007   BENIGN PROSTATIC HYPERTROPHY 03/31/2007   SKIN LESION 03/01/2008   [place] DISEASE, LUMBAR 03/31/2007   Nephrolithiasis 12/07/2010   Hypertensive crisis 12/07/2010   Encounter for well adult exam with abnormal findings 06/10/2011   Fatigue  06/14/2011   Ejection fraction     Erectile dysfunction 12/13/2011   Abdominal pain, other specified site 06/23/2012   Allergic conjunctivitis 01/12/2016   Poorly controlled type 2 diabetes mellitus with circulatory disorder ([place]) 10/02/2016   Right leg pain 12/04/2017   Grief 12/04/2017   Cerebral embolism with cerebral infarction 04/02/2018   Acute CVA (cerebrovascular accident) ([place]) 04/09/2018   History of CVA (cerebrovascular accident)     PAF (paroxysmal atrial fibrillation) ([facility])     Diastolic dysfunction     Acute ischemic left ACA stroke ([place]) 06/23/2018   Insomnia 01/31/2019   Right lumbar radiculopathy 10/20/2019   Bee sting reaction 07/10/2020   Vitamin D deficiency 07/10/2020   Skin lesion of right leg 11/14/2020   Uncontrolled type 2 diabetes mellitus with circulatory disorder causing erectile dysfunction ([place]) 11/14/2020   ICH (intracerebral hemorrhage) ([place]) 01/23/2021   SVT (supraventricular tachycardia) ([place])     Pressure injury of skin 02/06/2021   Aspiration pneumonia ([place]) 03/08/2021    Significant Hospital Events: Including procedures, antibiotic start and stop dates in addition to other pertinent events   Presented to emergency room 2021-04-20 septic with right lower lobe pneumonia secondary to aspiration requiring intubation  Interim History / Subjective:  NA  Objective   Blood pressure (!) 34/23, pulse 83, temperature 98 F (36.7 C), temperature source Oral, resp. rate 13, height 6' (1.829 m), weight 102.8 kg, SpO2 95 %.       No intake or output data in the 24 hours ending 04/18/21 2042  Filed Weights   04/20/2021  0343  Weight: 102.8 kg    Examination: General: Elderly male intubated and sedated HENT: Within normal limits Lungs: Coarse bilaterally Cardiovascular: Tachycardic Abdomen: Feeding tube in place Extremities: Within normal limits Neuro: Sedated GU: N/A  Resolved Hospital Problem list   NA  Assessment & Plan:  1.  Aspiration  pneumonia: Clinical history, radiographic studies, findings at time of intubation are consistent with aspiration of stomach contents.  We will continue Zosyn and vancomycin with mechanical ventilation.  2.  Hypotension/septic shock: Continue IV fluid expansion and vasopressor support as needed  3.  Anemia: Monitor, not at transfusion threshold  4.  AKI with creatinine of 1.62: Continue volume expansion and monitor  5.  History of CVA with right-sided hemiparesis and dysphagia    Best Practice (right click and "Reselect all SmartList Selections" daily)   Diet/type: NPO DVT prophylaxis: LMWH GI prophylaxis: H2B Lines: Central line Foley:  Yes, and it is still needed Code Status:  full code Last date of multidisciplinary goals of care discussion [patient intubated sedated unable to participate]  Labs   CBC: No results for input(s): WBC, NEUTROABS, HGB, HCT, MCV, PLT in the last 168 hours.   Basic Metabolic Panel: No results for input(s): NA, K, CL, CO2, GLUCOSE, BUN, CREATININE, CALCIUM, MG, PHOS in the last 168 hours.  GFR: Estimated Creatinine Clearance: 34.9 mL/min (A) (by C-G formula based on SCr of 1.99 mg/dL (H)). No results for input(s): PROCALCITON, WBC, LATICACIDVEN in the last 168 hours.   Liver Function Tests: No results for input(s): AST, ALT, ALKPHOS, BILITOT, PROT, ALBUMIN in the last 168 hours.  No results for input(s): LIPASE, AMYLASE in the last 168 hours. No results for input(s): AMMONIA in the last 168 hours.  ABG    Component Value Date/Time   PHART 7.445 04/16/21 0357   PCO2ART 44.5 04-16-21 0357   PO2ART 273 (H) April 16, 2021 0357   HCO3 30.0 (H) 04-16-21 0357   TCO2 31 2021-04-16 0357   O2SAT 100.0 2021/04/16 0357      Coagulation Profile: No results for input(s): INR, PROTIME in the last 168 hours.   Cardiac Enzymes: No results for input(s): CKTOTAL, CKMB, CKMBINDEX, TROPONINI in the last 168 hours.  HbA1C: Hgb A1c MFr Bld   Date/Time Value Ref Range Status  01/28/2021 04:39 AM 6.1 (H) 4.8 - 5.6 % Final    Comment:    (NOTE)         Prediabetes: 5.7 - 6.4         Diabetes: >6.4         Glycemic control for adults with diabetes: <7.0   01/23/2021 03:42 PM 5.8 (H) 4.8 - 5.6 % Final    Comment:    (NOTE)         Prediabetes: 5.7 - 6.4         Diabetes: >6.4         Glycemic control for adults with diabetes: <7.0     CBG: No results for input(s): GLUCAP in the last 168 hours.  Review of Systems:   Unable to obtain, patient intubated and sedated  Past Medical History:  He,  has a past medical history of ALLERGIC RHINITIS (10/13/2007), ANEMIA, CHRONIC DISEASE NEC (03/31/2007), BENIGN PROSTATIC HYPERTROPHY (03/31/2007), Chest pain, Chronic anemia, DIABETES MELLITUS, TYPE II (03/31/2007), DISC DISEASE, LUMBAR (03/31/2007), ED (erectile dysfunction), Ejection fraction, History of prostatitis, HYPERLIPIDEMIA (03/31/2007), HYPERTENSION (03/31/2007), Nephrolithiasis (12/07/2010), Obesity, POLYP, ANAL AND RECTAL (03/31/2007), Right eye trauma, and Stroke ([place]).   Surgical History:  Past Surgical History:  Procedure Laterality Date   IR GASTROSTOMY TUBE MOD SED  02/08/2021   LUMBAR DISC SURGERY     1999   PROSTATE BIOPSY     s/p   RECTAL POLYPECTOMY     Transanal excision 10/2005   SKIN BIOPSY     s/p right upper back 2009- benign Dr. Tonia Brooms     Social History:   reports that he quit smoking about 27 years ago. He has never used smokeless tobacco. He reports current alcohol use of about 1.0 standard drink of alcohol per week. He reports that he does not use drugs.   Family History:  His family history includes Colon polyps in his mother; Stroke in his mother. There is no history of CVA or Seizures.   Allergies No Known Allergies   Home Medications  Prior to Admission medications   Medication Sig Start Date End Date Taking? Authorizing Provider  acetaminophen (TYLENOL) 650 MG CR tablet Take 650 mg by mouth  every 6 (six) hours as needed for pain.    [provider]  amiodarone (PACERONE) 200 MG tablet Place 1 tablet (200 mg total) into feeding tube daily. 02/28/21   Charlynne Cousins, MD  amLODipine (NORVASC) 10 MG tablet Place 1 tablet (10 mg total) into feeding tube daily. 03/15/21   Aline August, MD  atorvastatin (LIPITOR) 80 MG tablet Place 1 tablet (80 mg total) into feeding tube every evening. 03/15/21   Aline August, MD  Blood Glucose Monitoring Suppl [facility] VERIO) w/Device KIT Use as directed twice daily E11.9 08/11/19   [name], MD  carvedilol (COREG) 12.5 MG tablet Place 1 tablet (12.5 mg total) into feeding tube 2 (two) times daily with a meal. 03/15/21   Starla Link, Kshitiz, MD  Dextran 70-Hypromellose (LUBRICATING TEARS EYE DROPS OP) Place 1 drop into both eyes at bedtime as needed (dry eyes).    [provider]  gabapentin (NEURONTIN) 100 MG capsule Take 1 capsule (100 mg total) by mouth 2 (two) times daily. 03/15/21   Aline August, MD  guaifenesin (ROBITUSSIN) 100 MG/5ML syrup Place 200 mg into feeding tube 2 (two) times daily.    [provider]  insulin detemir (LEVEMIR) 100 UNIT/ML injection Inject 0.1 mLs (10 Units total) into the skin 2 (two) times daily. 03/15/21   Aline August, MD  insulin lispro (HUMALOG) 100 UNIT/ML injection Inject 0-14 Units into the skin See admin instructions. Sliding scale as follows: Check blood sugar three times daily. If blood sugar is <200=0u, 201-250=2u, 251-300=4u, 301-350=6u, 351-400=8u, 401-450=10u, 451-500=12u, if reads HIGH give 14u, recheck in 2 hours, and if blood sugar is >400  or <100 call [facility] (Do not change times)    [provider]  Insulin Pen Needle 32G X 4 MM MISC Use 1x a day 01/03/21   Philemon Kingdom, MD  Lancets MISC Use as directed twice daily E11.9 08/11/19   [name], MD  metFORMIN (GLUCOPHAGE) 500 MG tablet Place 500 mg into feeding tube 2 (two) times daily with a meal.    [provider]  Nutritional Supplements (FEEDING SUPPLEMENT, JEVITY 1.5 CAL,) LIQD Place 30 mLs into feeding tube continuous.    [provider]  [facility] VERIO test strip USE AS DIRECTED TWICE DAILY E11.9 08/19/20   [name], MD  sitaGLIPtin (JANUVIA) 50 MG tablet Place 50 mg into feeding tube daily.    [provider]  Water For Irrigation, Sterile (FREE WATER) SOLN Place 100 mLs  into feeding tube every 4 (four) hours. 02/28/21   Charlynne Cousins, MD     Critical care time: 45 minutes spent in bedside evaluation chart review and critical care planning

## 2021-06-08 ENCOUNTER — Ambulatory Visit: Payer: Medicare HMO | Admitting: Internal Medicine

## 2023-04-18 IMAGING — CT CT HEAD W/O CM
4 series · 16 of 47 positions shown, 18 images · non-contrast
Comparison: 01/25/2021 and earlier.

CLINICAL DATA: Follow-up hemorrhagic stroke involving the LEFT
thalamus with extension into the ventricular system.

EXAM:
CT HEAD WITHOUT CONTRAST
TECHNIQUE: Contiguous axial images were obtained from the base of the skull
through the vertex without intravenous contrast.

[Series 3: head without · axial · non-contrast · 0.45mm/px · z∈[-103,+17]mm · 7 of 34 slices shown, 9 images]
[im 5/34  brain]
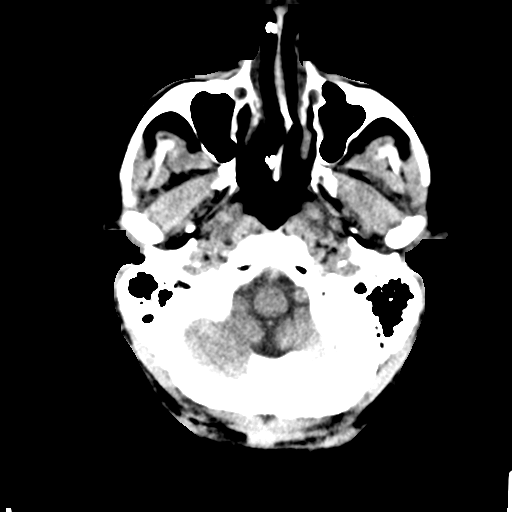
[im 5/34  bone]
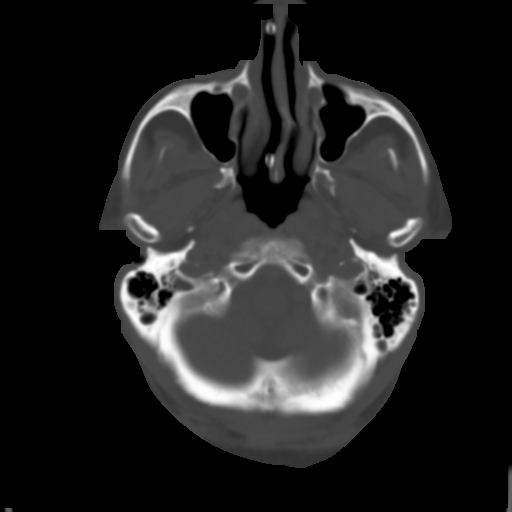
[im 9/34  brain]
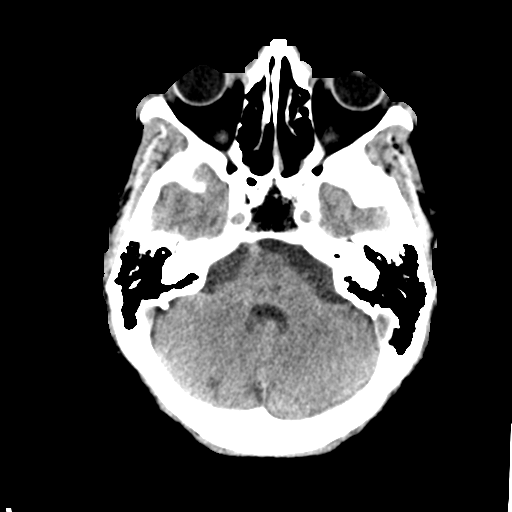
[im 13/34  brain]
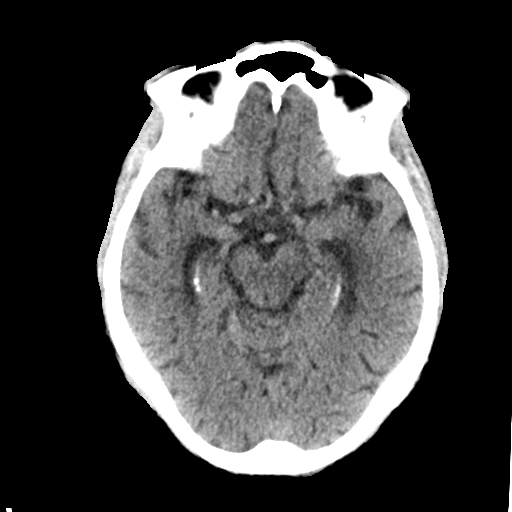
[im 17/34  brain]
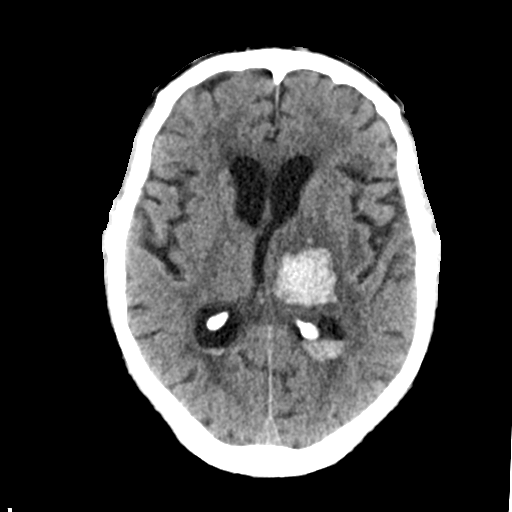
[im 21/34  brain]
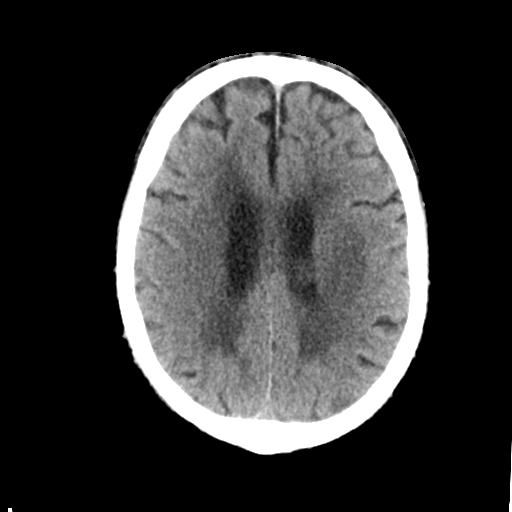
[im 21/34  bone]
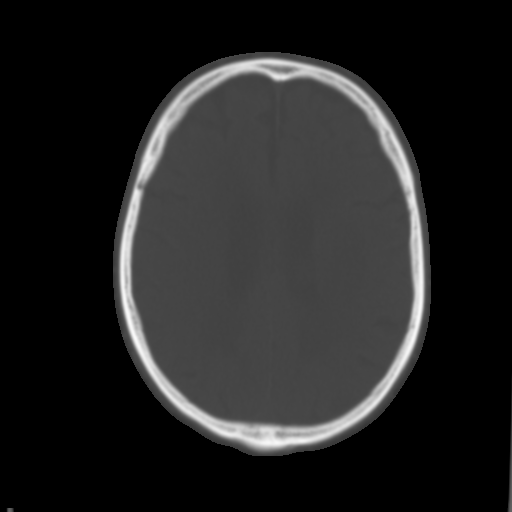
[im 25/34  brain]
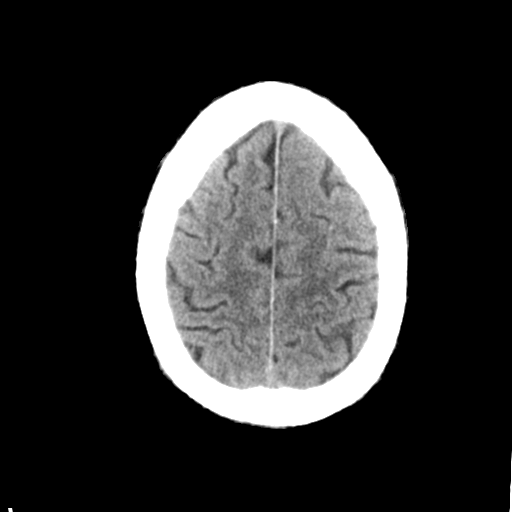
[im 29/34  brain]
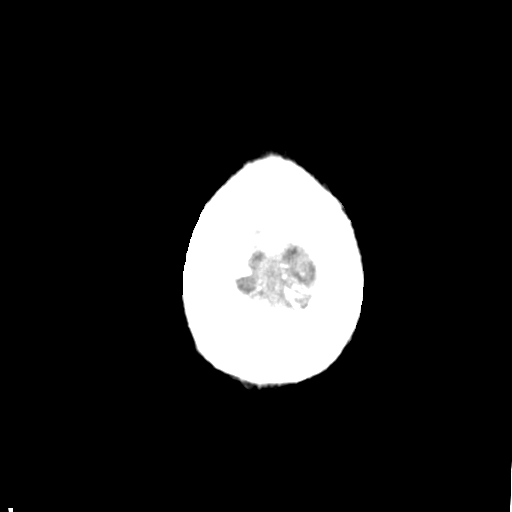

[Series 4: head bone · axial · 0.45mm/px · z∈[-107,-75]mm · 3 of 84 slices shown]
[im 9/84  bone]
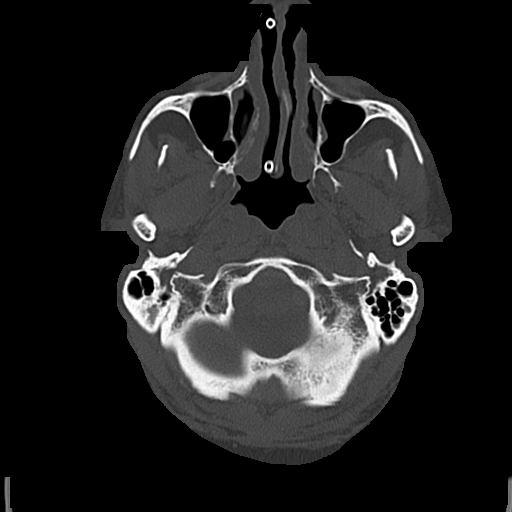
[im 17/84  bone]
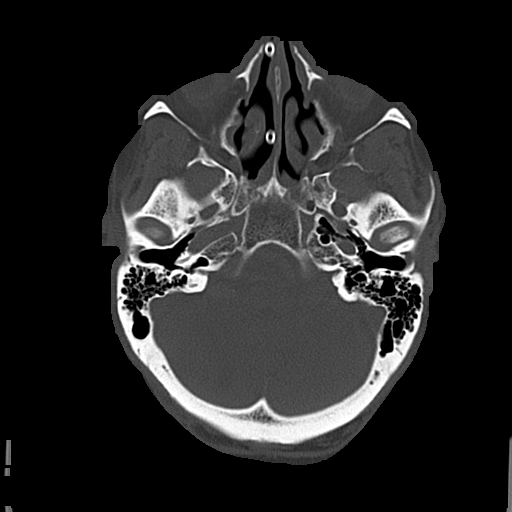
[im 25/84  bone]
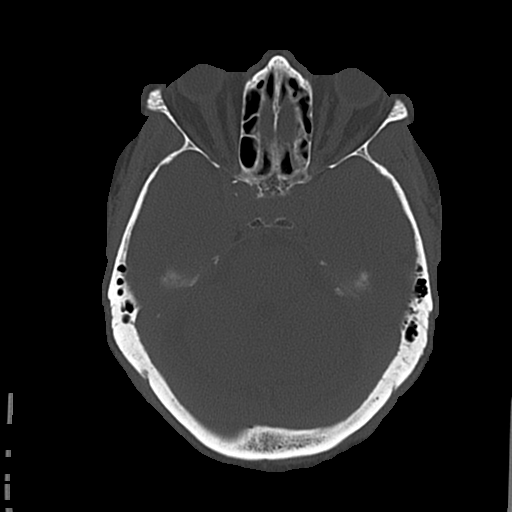

[Series 5: head without cor · coronal · non-contrast · 0.32mm/px · 3 of 77 slices shown]
[im 26/77  brain]
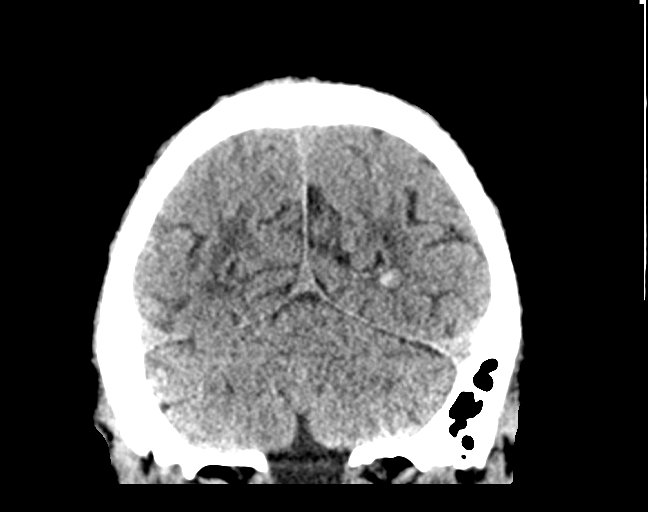
[im 34/77  brain]
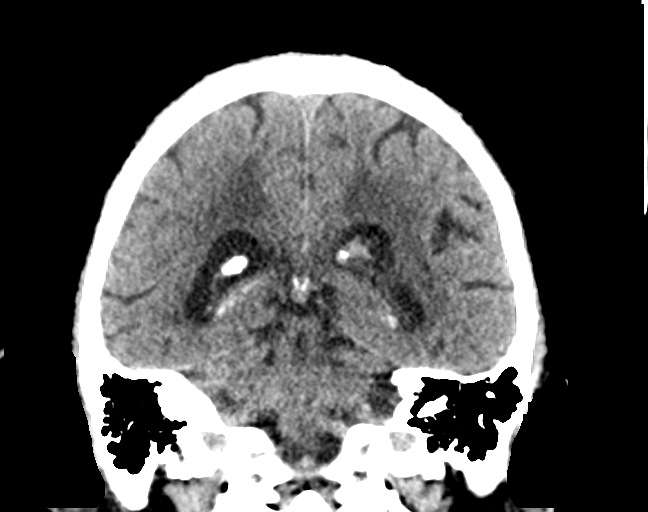
[im 43/77  brain]
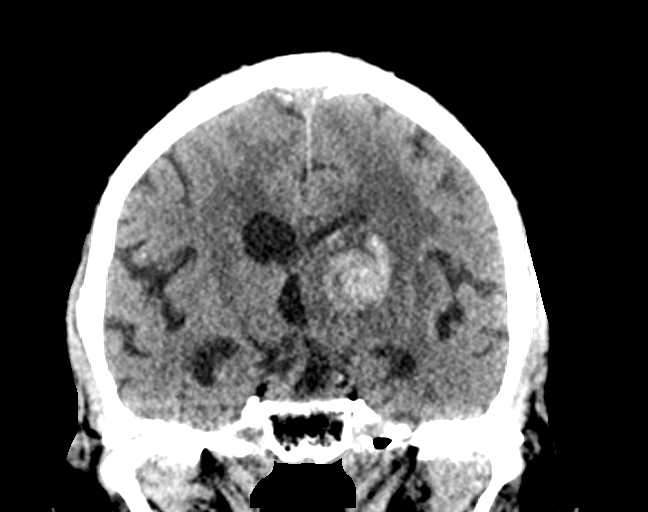

[Series 6: head without sag · sagittal · non-contrast · 0.32mm/px · 3 of 66 slices shown]
[im 22/66  brain]
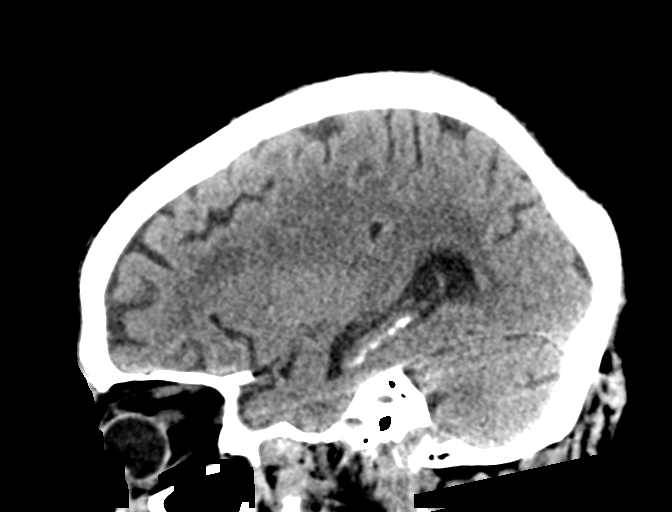
[im 33/66  brain]
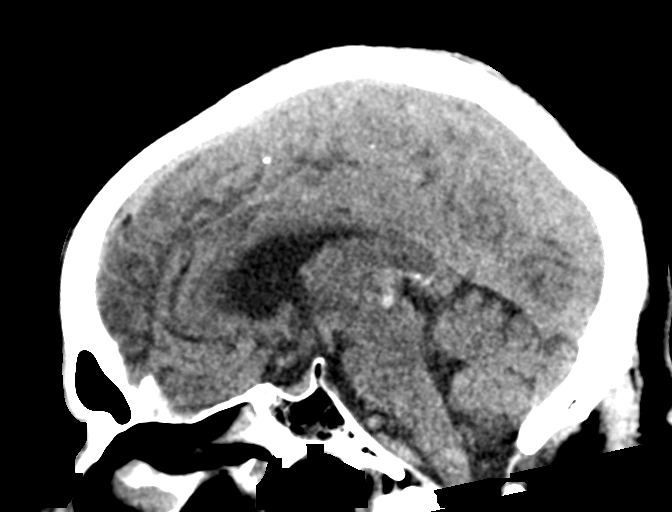
[im 44/66  brain]
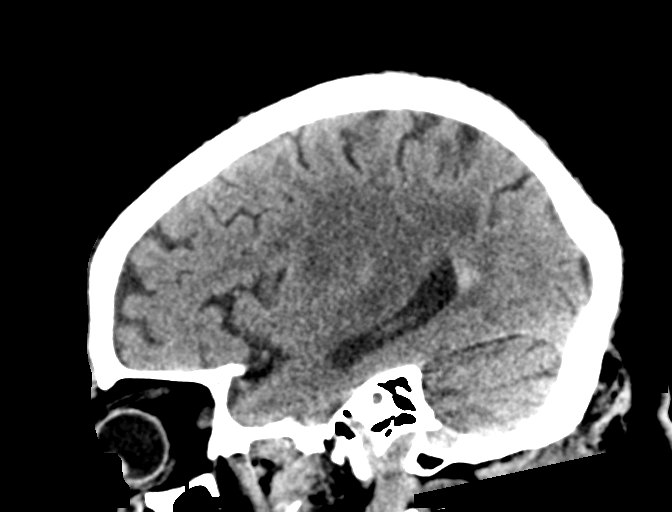

[16 of 47 positions shown; findings below may reference images not displayed]

FINDINGS: Brain: Hematoma involving the LEFT thalamus measuring approximately
2.6 x 2.8 x 2.8 cm, unchanged in size since the initial 01/23/2021
CT. Interval decrease in the amount of intraventricular hemorrhage
in the lateral ventricles and the third ventricle. Mass-effect with
midline shift of approximately 7 mm to the RIGHT, unchanged. No new
hemorrhage or hematoma. No extra-axial fluid collections.

Vascular: Mild BILATERAL carotid siphon and vertebral artery
atherosclerosis. Hyperdense vessel.

Skull: No skull fracture or other focal osseous abnormality
involving the skull.

Sinuses/Orbits: Paranasal sinuses, mastoid air cells and middle ear
cavities well-aerated. Benign senile scleral calcifications in both
eyes.

Other: None.
IMPRESSION: 1. Stable LEFT thalamic hematoma and associated midline shift to the
RIGHT of approximately 7 mm.
2. Decreasing intraventricular hemorrhage.
3. No new intracranial abnormality.

## 2023-04-25 IMAGING — DX DG CHEST 1V PORT
1 series · 1 of 1 positions shown · non-contrast
Comparison: January 30, 2021

CLINICAL DATA: Shortness of breath

EXAM:
PORTABLE CHEST 1 VIEW

[chest ap]
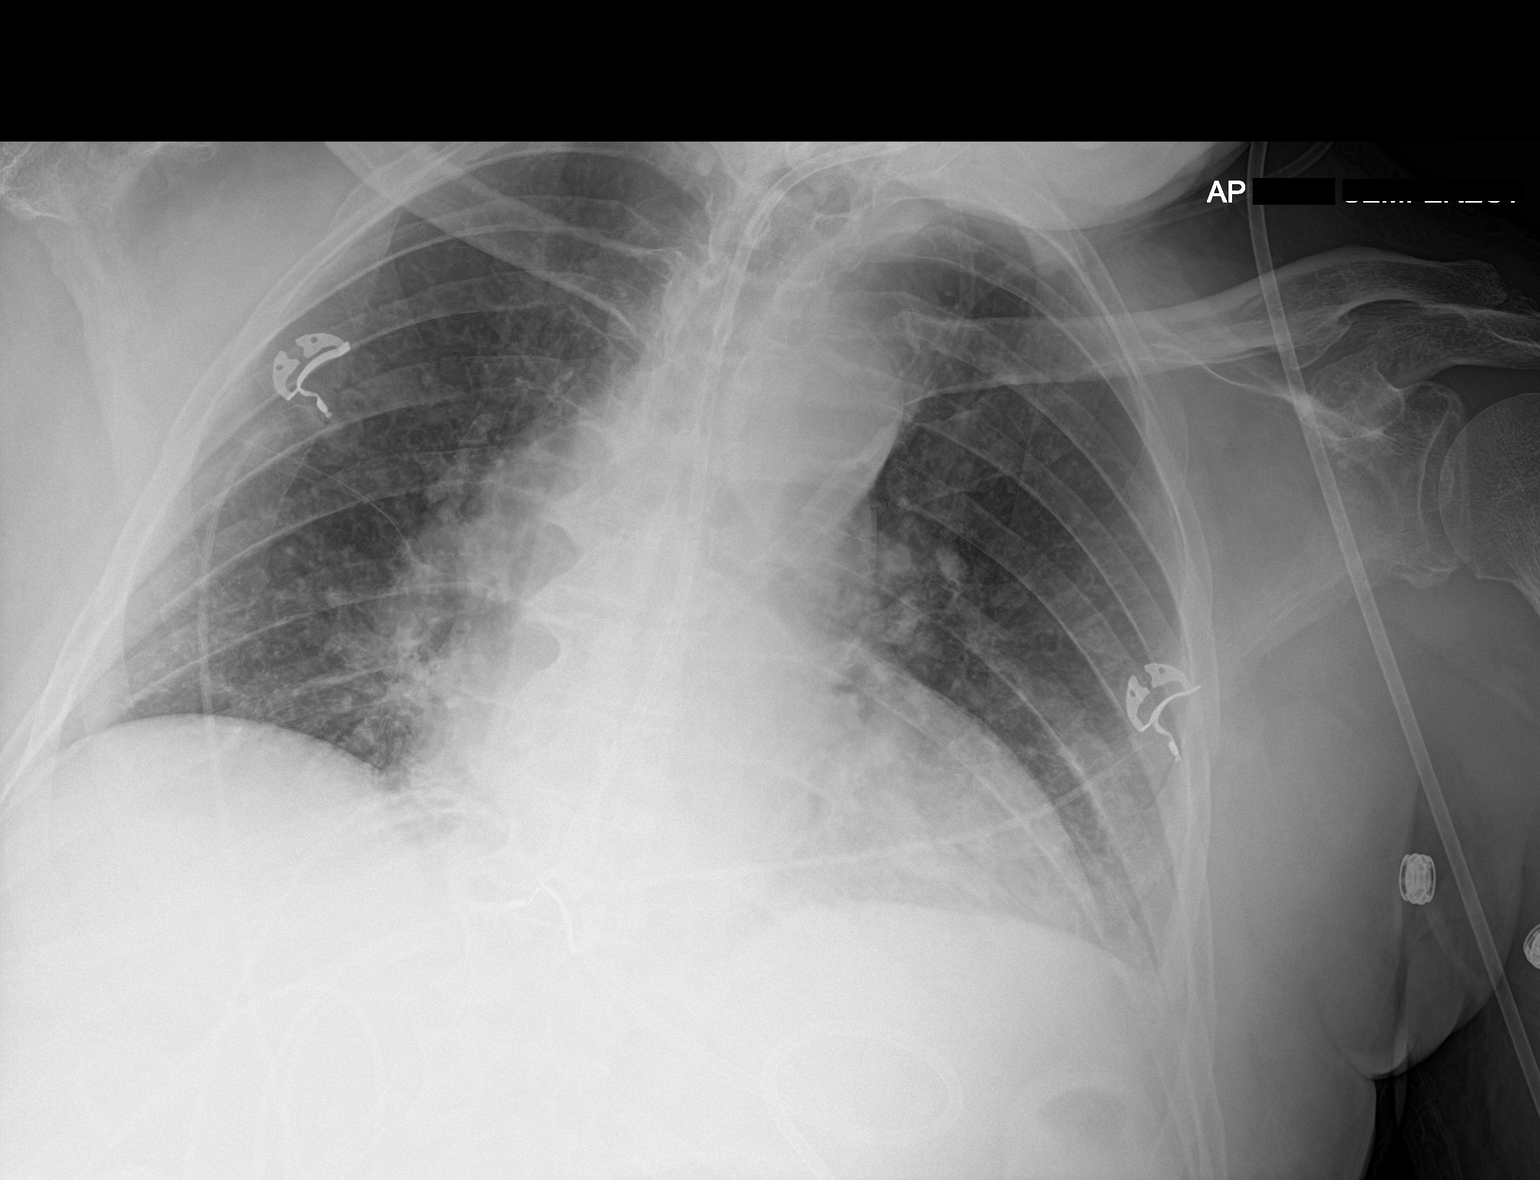

[1 of 1 positions shown; findings below may reference images not displayed]

FINDINGS: Enteric tube tip is below the diaphragm, not seen on current
examination. There is slight left base atelectasis. Lungs elsewhere
are clear. Heart is upper normal in size with pulmonary vascularity
normal. No adenopathy. There is aortic atherosclerosis. No bone
lesions.
IMPRESSION: Enteric tube tip below diaphragm. Atelectasis left base. Earliest
changes of pneumonia in this area questioned. Lungs elsewhere clear.
Stable cardiac silhouette. Aortic Atherosclerosis ([id number]).

## 2023-05-03 IMAGING — DX DG CHEST 1V PORT
1 series · 1 of 1 positions shown · non-contrast
Comparison: 02/03/2021.

CLINICAL DATA: Fever and cough.

EXAM:
PORTABLE CHEST 1 VIEW

[chest]
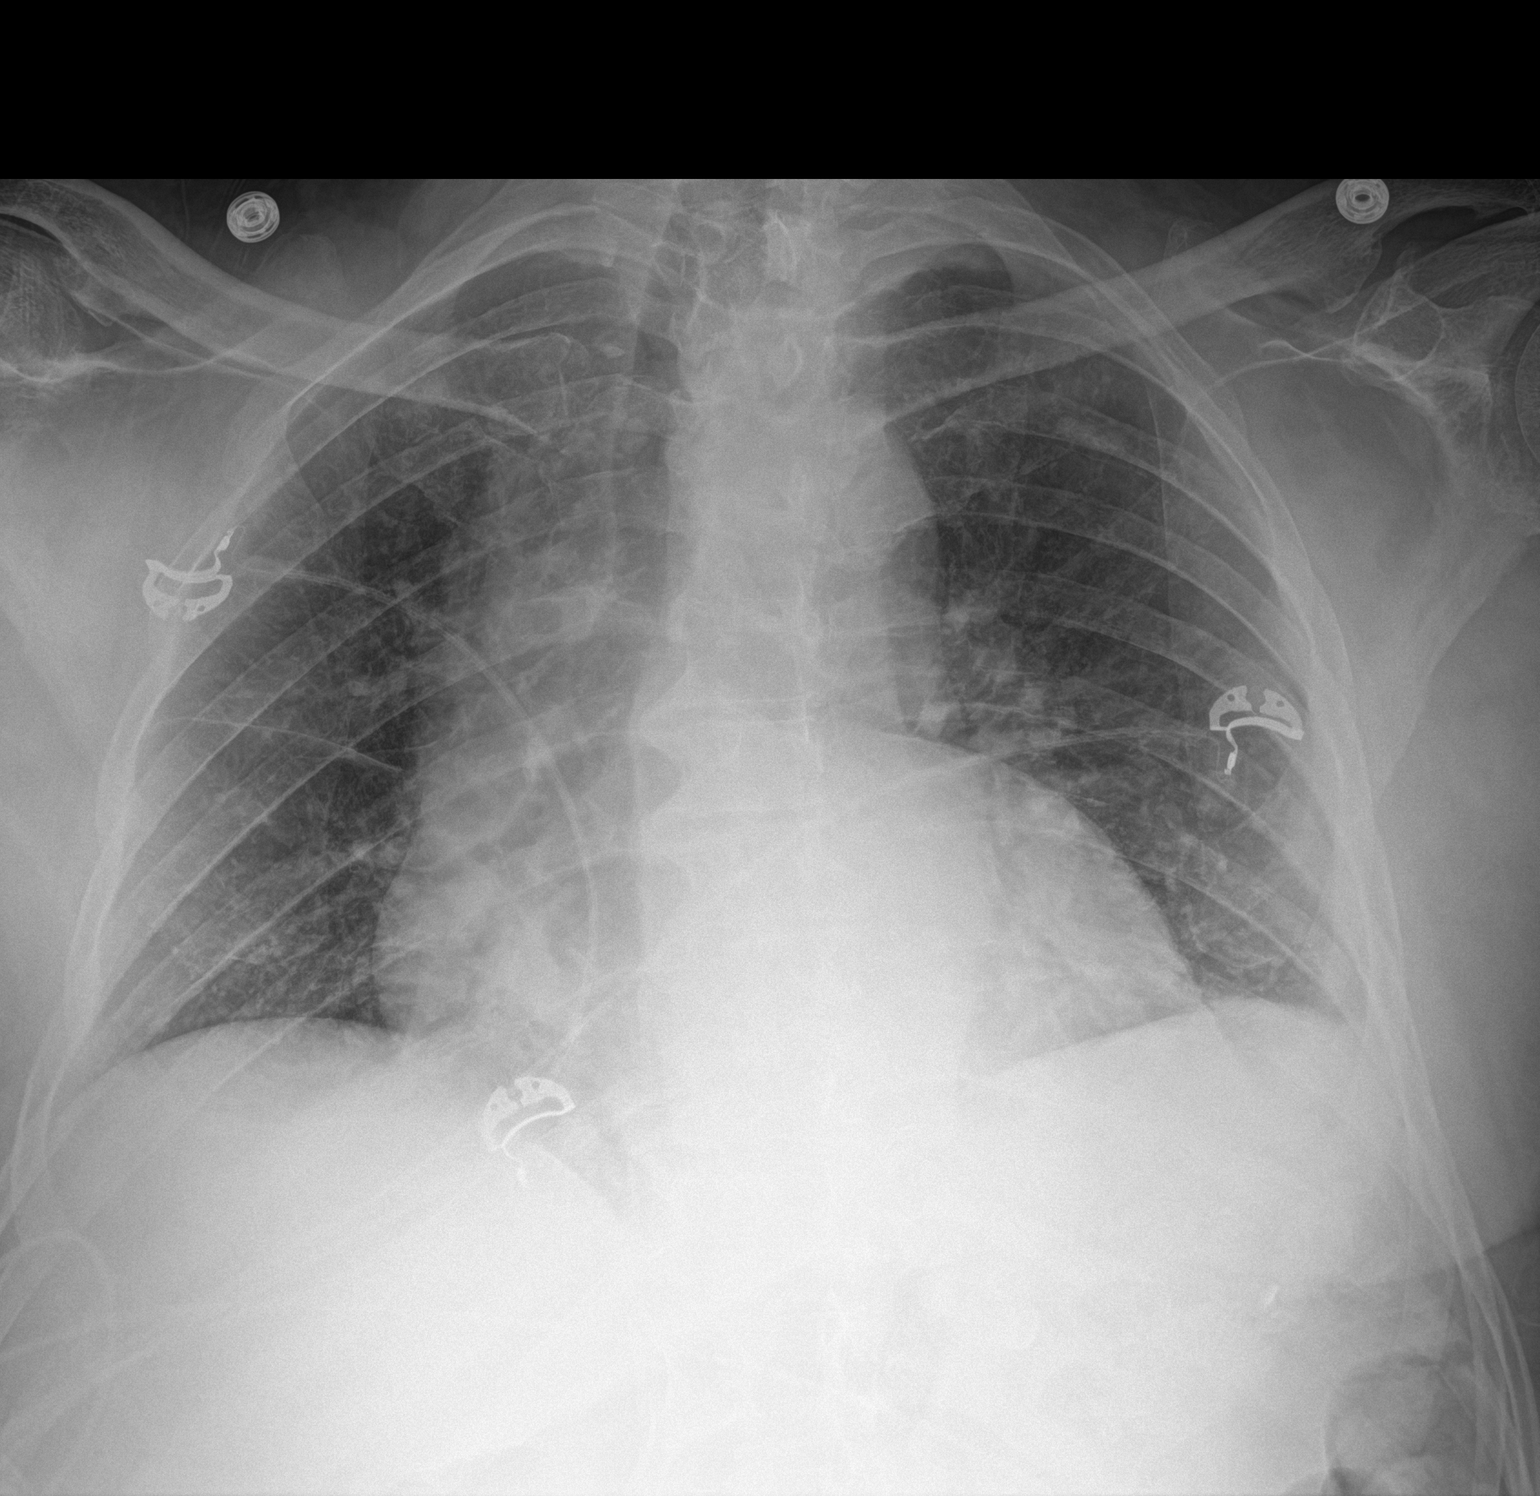

[1 of 1 positions shown; findings below may reference images not displayed]

FINDINGS: Stable cardiac silhouette.  No mediastinal or hilar masses.

Prominent bronchovascular markings most evident at the bases. Subtle
opacity at the lateral left lung base is similar to the prior exam,
likely due to atelectasis, accentuated by the patient rotation. No
convincing pneumonia. No evidence of pulmonary edema.

No pleural effusion or pneumothorax.
IMPRESSION: 1. No acute cardiopulmonary disease.

## 2023-05-05 IMAGING — DX DG ABDOMEN 1V
1 series · 1 of 1 positions shown · non-contrast
Comparison: None.

CLINICAL DATA: Peg tube placement

EXAM:
ABDOMEN - 1 VIEW

[abdomen kub]
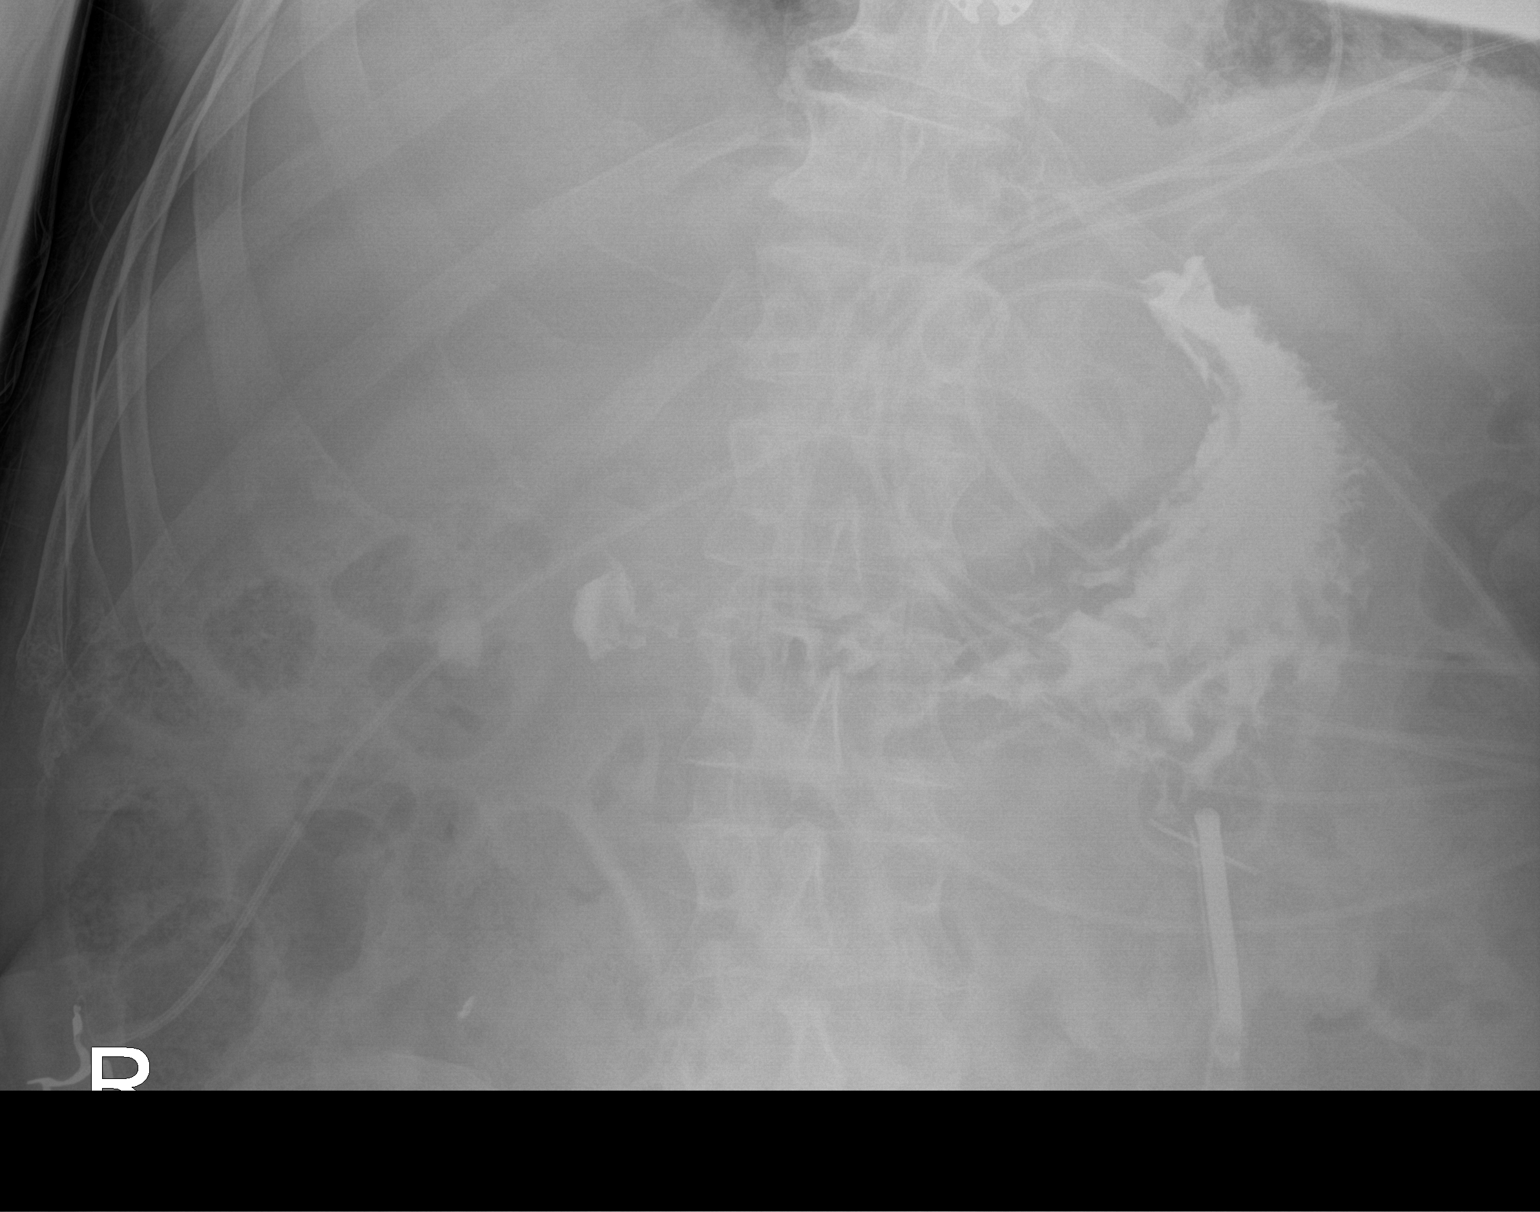

[1 of 1 positions shown; findings below may reference images not displayed]

FINDINGS: Contrast is hand injected through the PEG tube opacifying the
stomach. Peg tube is in satisfactory position. No extraluminal
contrast to suggest extravasation. No bowel dilatation to suggest
obstruction. No evidence of pneumoperitoneum, portal venous gas or
pneumatosis.

No pathologic calcifications along the expected course of the
ureters.

No acute osseous abnormality.
IMPRESSION: Peg tube is in satisfactory position. No extraluminal contrast to
suggest extravasation.
